# Patient Record
Sex: Male | Born: 1940 | Race: White | Hispanic: No | Smoking: Former smoker
Health system: Southern US, Community
[De-identification: ages and names within clinical notes are randomized; demographics above are authoritative.]

## PROBLEM LIST (undated history)

## (undated) ENCOUNTER — Emergency Department (HOSPITAL_COMMUNITY): Admission: EM | Payer: Self-pay | Source: Home / Self Care

## (undated) DIAGNOSIS — I2510 Atherosclerotic heart disease of native coronary artery without angina pectoris: Secondary | ICD-10-CM

## (undated) DIAGNOSIS — I209 Angina pectoris, unspecified: Secondary | ICD-10-CM

## (undated) DIAGNOSIS — I214 Non-ST elevation (NSTEMI) myocardial infarction: Secondary | ICD-10-CM

## (undated) DIAGNOSIS — I10 Essential (primary) hypertension: Secondary | ICD-10-CM

## (undated) DIAGNOSIS — I509 Heart failure, unspecified: Secondary | ICD-10-CM

## (undated) DIAGNOSIS — R0602 Shortness of breath: Secondary | ICD-10-CM

## (undated) DIAGNOSIS — I219 Acute myocardial infarction, unspecified: Secondary | ICD-10-CM

## (undated) DIAGNOSIS — E7800 Pure hypercholesterolemia, unspecified: Secondary | ICD-10-CM

## (undated) DIAGNOSIS — H3530 Unspecified macular degeneration: Secondary | ICD-10-CM

## (undated) DIAGNOSIS — N3000 Acute cystitis without hematuria: Secondary | ICD-10-CM

## (undated) HISTORY — PX: HERNIA REPAIR: SHX51

## (undated) HISTORY — PX: CORONARY ANGIOPLASTY WITH STENT PLACEMENT: SHX49

## (undated) HISTORY — DX: Heart failure, unspecified: I50.9

## (undated) HISTORY — PX: TONSILLECTOMY: SUR1361

---

## 1994-04-13 DIAGNOSIS — I219 Acute myocardial infarction, unspecified: Secondary | ICD-10-CM

## 1994-04-13 HISTORY — DX: Acute myocardial infarction, unspecified: I21.9

## 1999-01-28 ENCOUNTER — Encounter (HOSPITAL_BASED_OUTPATIENT_CLINIC_OR_DEPARTMENT_OTHER): Payer: Self-pay | Admitting: General Surgery

## 1999-01-30 ENCOUNTER — Ambulatory Visit (HOSPITAL_COMMUNITY): Admission: RE | Admit: 1999-01-30 | Discharge: 1999-01-31 | Payer: Self-pay | Admitting: General Surgery

## 2005-03-26 ENCOUNTER — Ambulatory Visit: Payer: Self-pay | Admitting: Gastroenterology

## 2005-11-12 ENCOUNTER — Other Ambulatory Visit: Payer: Self-pay

## 2005-11-12 ENCOUNTER — Inpatient Hospital Stay: Payer: Self-pay | Admitting: Internal Medicine

## 2007-10-06 ENCOUNTER — Inpatient Hospital Stay: Payer: Self-pay | Admitting: Internal Medicine

## 2007-10-07 ENCOUNTER — Other Ambulatory Visit: Payer: Self-pay

## 2007-10-22 ENCOUNTER — Emergency Department: Payer: Self-pay | Admitting: Emergency Medicine

## 2007-11-30 ENCOUNTER — Encounter: Payer: Self-pay | Admitting: Internal Medicine

## 2007-12-13 ENCOUNTER — Encounter: Payer: Self-pay | Admitting: Internal Medicine

## 2008-01-12 ENCOUNTER — Encounter: Payer: Self-pay | Admitting: Internal Medicine

## 2008-02-12 ENCOUNTER — Encounter: Payer: Self-pay | Admitting: Internal Medicine

## 2010-01-30 ENCOUNTER — Ambulatory Visit: Payer: Self-pay | Admitting: Orthopedic Surgery

## 2011-03-14 DIAGNOSIS — N3000 Acute cystitis without hematuria: Secondary | ICD-10-CM

## 2011-03-14 HISTORY — DX: Acute cystitis without hematuria: N30.00

## 2011-03-20 ENCOUNTER — Inpatient Hospital Stay: Payer: Self-pay | Admitting: Urology

## 2011-03-21 ENCOUNTER — Ambulatory Visit: Payer: Self-pay | Admitting: Urology

## 2011-03-26 ENCOUNTER — Emergency Department: Payer: Self-pay | Admitting: *Deleted

## 2011-03-28 ENCOUNTER — Emergency Department: Payer: Self-pay | Admitting: Emergency Medicine

## 2011-04-03 ENCOUNTER — Inpatient Hospital Stay: Payer: Self-pay | Admitting: Internal Medicine

## 2011-04-06 ENCOUNTER — Encounter: Payer: Self-pay | Admitting: *Deleted

## 2011-04-06 ENCOUNTER — Observation Stay (HOSPITAL_COMMUNITY)
Admission: EM | Admit: 2011-04-06 | Discharge: 2011-04-07 | Disposition: A | Discharge status: Home / Self Care | Payer: Medicare Other | Attending: Urology | Admitting: Urology

## 2011-04-06 DIAGNOSIS — Z79899 Other long term (current) drug therapy: Secondary | ICD-10-CM | POA: Insufficient documentation

## 2011-04-06 DIAGNOSIS — R339 Retention of urine, unspecified: Secondary | ICD-10-CM

## 2011-04-06 DIAGNOSIS — I10 Essential (primary) hypertension: Secondary | ICD-10-CM | POA: Insufficient documentation

## 2011-04-06 DIAGNOSIS — R319 Hematuria, unspecified: Principal | ICD-10-CM | POA: Insufficient documentation

## 2011-04-06 DIAGNOSIS — D090 Carcinoma in situ of bladder: Secondary | ICD-10-CM | POA: Insufficient documentation

## 2011-04-06 DIAGNOSIS — I2510 Atherosclerotic heart disease of native coronary artery without angina pectoris: Secondary | ICD-10-CM | POA: Insufficient documentation

## 2011-04-06 DIAGNOSIS — J45909 Unspecified asthma, uncomplicated: Secondary | ICD-10-CM | POA: Insufficient documentation

## 2011-04-06 HISTORY — DX: Essential (primary) hypertension: I10

## 2011-04-06 HISTORY — DX: Atherosclerotic heart disease of native coronary artery without angina pectoris: I25.10

## 2011-04-06 LAB — CBC
MCV: 83.9 fL (ref 78.0–100.0)
Platelets: 294 10*3/uL (ref 150–400)
RBC: 3.8 MIL/uL — ABNORMAL LOW (ref 4.22–5.81)
WBC: 8.9 10*3/uL (ref 4.0–10.5)

## 2011-04-06 LAB — BASIC METABOLIC PANEL
CO2: 28 mEq/L (ref 19–32)
Calcium: 9.3 mg/dL (ref 8.4–10.5)
Chloride: 101 mEq/L (ref 96–112)
Glucose, Bld: 96 mg/dL (ref 70–99)
Potassium: 3.8 mEq/L (ref 3.5–5.1)
Sodium: 136 mEq/L (ref 135–145)

## 2011-04-06 LAB — HEPATIC FUNCTION PANEL
ALT: 99 U/L — ABNORMAL HIGH (ref 0–53)
AST: 50 U/L — ABNORMAL HIGH (ref 0–37)
Albumin: 3 g/dL — ABNORMAL LOW (ref 3.5–5.2)
Alkaline Phosphatase: 107 U/L (ref 39–117)
Total Bilirubin: 0.3 mg/dL (ref 0.3–1.2)
Total Protein: 6.5 g/dL (ref 6.0–8.3)

## 2011-04-06 LAB — DIFFERENTIAL
Eosinophils Relative: 1 % (ref 0–5)
Lymphocytes Relative: 20 % (ref 12–46)
Lymphs Abs: 1.8 10*3/uL (ref 0.7–4.0)
Neutro Abs: 6.2 10*3/uL (ref 1.7–7.7)

## 2011-04-06 LAB — URINE MICROSCOPIC-ADD ON

## 2011-04-06 LAB — URINALYSIS, ROUTINE W REFLEX MICROSCOPIC
Ketones, ur: NEGATIVE mg/dL
Nitrite: NEGATIVE
Protein, ur: 30 mg/dL — AB

## 2011-04-06 MED ORDER — SODIUM CHLORIDE 0.9 % IR SOLN
3000.0000 mL | Status: DC
  Administered 2011-04-06 (×2): 3000 mL
  Filled 2011-04-06: qty 3000

## 2011-04-06 MED ORDER — SODIUM CHLORIDE 0.9 % IV SOLN
Freq: Once | INTRAVENOUS | Status: AC
  Administered 2011-04-06: 125 mL via INTRAVENOUS

## 2011-04-06 NOTE — ED Notes (Signed)
continuous bladder irrigation initiated.

## 2011-04-06 NOTE — ED Notes (Signed)
Calling report to charge rn on [facility] and was told i have to hold the patient for 30 min till the nurse they called in arrives to resume care of the patient.

## 2011-04-06 NOTE — Progress Notes (Signed)
Pt. Arrived to floor per cart from ED just before 2200.  Oriented to room, [place] viewed.  IV NSL.  Continuous bladder infusion was stopped, irrigation bags were empty.  Additional bags requisitioned, irrigation restarted at this time, output light pink to pale yellow.  Patient without complaint of pain/discomfort. Howell Pringle Diella Gillingham,RN  2300 04/06/11

## 2011-04-06 NOTE — ED Notes (Signed)
Per dr ordered pt allowed to have meal tray. Ham sandwhich and fruit cup provided to pt.

## 2011-04-06 NOTE — ED Notes (Signed)
Pt has been experiencing urinary retention x 3.5 hours.  Pt presents with the inability to urinate and experiencing bladder pain.

## 2011-04-06 NOTE — H&P (Signed)
Urology History and Physical Exam  CC: Gross hematuria  HPI: 70 year old male presents with clot urinary retention.  He had a catheter placed about one week ago due to gross hematuria.  He has already began a work up for his gross hematuria with urologist Dr. Bernardo Heater at [facility].  He had a cystoscopy, bladder biopsy, and bilateral retrograde pyelograms.  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    Today, I irrigated his catheter by hand with over 4 liter of sterile NS with continued return of old clot.  He will need to be admitted for continuous bladder irrigation.  His labs are stable with good renal function and a stable anemia (Hgb 10.6 from 10.1 on 04/03/11).  PMH: Past Medical History  Diagnosis Date  . Hypertension   . Asthma   . Coronary artery disease     PSH: Past Surgical History  Procedure Date  . Coronary stent placement   . Hernia repair     Allergies: No Known Allergies  Medications:  (Not in a hospital admission)   Social History: History   Social History  . Marital Status: Married    Spouse Name: N/A    Number of Children: N/A  . Years of Education: N/A   Occupational History  . Not on file.   Social History Main Topics  . Smoking status: Not on file  . Smokeless tobacco: Not on file  . Alcohol Use:   . Drug Use:   . Sexually Active:    Other Topics Concern  . Not on file   Social History Narrative  . No narrative on file    Family History: History reviewed. No pertinent family history.  Review of Systems: Positive: Bladder spasms Negative: Chest pain, SOB.  A further 10 point review of systems was negative except what is listed in the HPI.  Physical Exam: Filed Vitals:   04/06/11 1859  BP: 141/59  Pulse: 70  Temp: 98 F (36.7 C)  Resp: 18   General: No acute distress.  Awake. Head:  Normocephalic.  Atraumatic. ENT:  EOMI.  Mucous membranes moist Neck:  Supple.  No lymphadenopathy. CV:  S1  present. S2 present. Regular rate. Pulmonary: Equal effort bilaterally.  Clear to auscultation bilaterally. Abdomen: Soft.  Non-tender to palpation. Skin:  Normal turgor.  No visible rash. Extremity: No gross deformity of bilateral upper extremities.  No gross deformity of    bilateral lower extremities. Neurologic: Alert. Appropriate mood.  Penis:  Circumcised.  No lesions. Urethra: 22Fr 3-way Foley catheter in place.  Orthotopic meatus. Scrotum: No lesions.  No ecchymosis.  No erythema. Testicles: Descended bilaterally.  No masses bilaterally. Epididymis: Palpable bilaterally.  Mildly tender to palpation.  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  Plan: -Patient has follow up scheduled with Dr. Bernardo Heater.  He wishes to keep this for definitive treatment of his  bladder cancer.  -Admit to Urology for continuous bladder irrigation. -Will reassess tomorrow; possible discharge home with catheter in place.

## 2011-04-06 NOTE — ED Notes (Signed)
Pt reports unable to void since 0430 this morning accompanied by bladder pain. Previous hx of same recently. Foley had been placed, bloody urine noted. Pain had subsided. Denied any pain right now. 350 cc of urine noted in foley. Pt is alert, oriented. ABC intact

## 2011-04-06 NOTE — ED Provider Notes (Signed)
History     CSN: [id number]  Arrival date & time 04/06/11  0619   First MD Initiated Contact with Patient 04/06/11 0750      Chief Complaint  Patient presents with  . Urinary Retention    (Consider location/radiation/quality/duration/timing/severity/associated sxs/prior treatment) The history is provided by the patient.   Patient here with irritation x2 and half hours. Patient was discharged from [facility] yesterday after an admission for persistent hematuria. According to the patient, he has had a cystoscopy which did not show her recent for his urinary retention. He has not had a Foley catheter for 2 days and was doing fine until this morning when he noticed that he started having suprapubic pain. Denies any vomiting or dysuria. No flank pain noted. Nothing makes the symptoms worse. Pain is described as being sharp in nature Past Medical History  Diagnosis Date  . Hypertension   . Asthma   . Coronary artery disease     Past Surgical History  Procedure Date  . Coronary stent placement   . Hernia repair     History reviewed. No pertinent family history.  History  Substance Use Topics  . Smoking status: Not on file  . Smokeless tobacco: Not on file  . Alcohol Use:       Review of Systems  All other systems reviewed and are negative.    Allergies  Review of patient's allergies indicates no known allergies.  Home Medications   Current Outpatient Rx  Name Route Sig Dispense Refill  . AMLODIPINE BESY-BENAZEPRIL HCL 10-20 MG PO CAPS Oral Take 1 capsule by mouth daily.      . IRBESARTAN-HYDROCHLOROTHIAZIDE 150-12.5 MG PO TABS Oral Take 1 tablet by mouth daily.      Marland Kitchen NIACIN (ANTIHYPERLIPIDEMIC) 1000 MG PO TBCR Oral Take 1,000 mg by mouth at bedtime.      . OMEPRAZOLE 10 MG PO CPDR Oral Take 10 mg by mouth daily.      Marland Kitchen POTASSIUM CHLORIDE 20 MEQ PO PACK Oral Take 20 mEq by mouth 2 (two) times daily.      Marland Kitchen TAMSULOSIN HCL 0.4 MG PO CAPS Oral Take  0.4 mg by mouth daily after breakfast.       BP 188/71  Pulse 74  Temp(Src) 98.6 F (37 C) (Oral)  Resp 20  SpO2 97%  Physical Exam  Nursing note and vitals reviewed. Constitutional: He is oriented to person, place, and time. He appears well-developed and well-nourished.  Non-toxic appearance. No distress.  HENT:  Head: Normocephalic and atraumatic.  Eyes: Conjunctivae, EOM and lids are normal. Pupils are equal, round, and reactive to light.  Neck: Normal range of motion. Neck supple. No tracheal deviation present. No mass present.  Cardiovascular: Normal rate, regular rhythm and normal heart sounds.  Exam reveals no gallop.   No murmur heard. Pulmonary/Chest: Effort normal and breath sounds normal. No stridor. No respiratory distress. He has no decreased breath sounds. He has no wheezes. He has no rhonchi. He has no rales.  Abdominal: Soft. Normal appearance and bowel sounds are normal. He exhibits no distension. There is no tenderness. There is no rebound and no CVA tenderness.  Musculoskeletal: Normal range of motion. He exhibits no edema and no tenderness.  Neurological: He is alert and oriented to person, place, and time. He has normal strength. No cranial nerve deficit or sensory deficit. GCS eye subscore is 4. GCS verbal subscore is 5. GCS motor subscore is 6.  Skin: Skin is warm  and dry. No abrasion and no rash noted.  Psychiatric: He has a normal mood and affect. His speech is normal and behavior is normal.    ED Course  Procedures (including critical care time)   Labs Reviewed  URINALYSIS, ROUTINE W REFLEX MICROSCOPIC  URINE CULTURE  CBC  DIFFERENTIAL  BASIC METABOLIC PANEL   No results found.   No diagnosis found.    MDM  Patient's old records from, his region reviewed. Patient had a negative urine culture and a hemoglobin of 10.3 on December 23 at time of discharge. Patient did have a cystoscopy with biopsy which was negative. Patient's renal function also  noted to be normal      1:39 PM Spoke with urologist on call, dr. Hessie Knows, and he has been to the department and seen the patient. He will need to return again to complete his evaluation. Family instructed that there will be a delay with him seen the patient again and they understand this. They also understand that there is possibility that the patient may not be admitted to the hospital.  Leota Jacobsen, MD 04/06/11 1340

## 2011-04-06 NOTE — ED Notes (Signed)
Pt informed that we are still waiting for lab values to come back.

## 2011-04-06 NOTE — ED Notes (Signed)
Urologist in room with patient, he is irrigating patient bladder at this moment.  Patient stable.

## 2011-04-06 NOTE — ED Notes (Signed)
Pt's foley irrigated, clot noted. EDP notified. Pending of urology consult.

## 2011-04-06 NOTE — ED Notes (Signed)
Urology at bedside.

## 2011-04-06 NOTE — ED Notes (Signed)
Pt reports his foley may be clotted, foley checked, is draining fine

## 2011-04-07 ENCOUNTER — Encounter (HOSPITAL_COMMUNITY): Payer: Self-pay | Admitting: *Deleted

## 2011-04-07 MED ORDER — STERILE WATER FOR IRRIGATION IR SOLN: Freq: Once | Status: DC

## 2011-04-07 MED ORDER — HYOSCYAMINE SULFATE 0.125 MG SL SUBL
0.1250 mg | SUBLINGUAL_TABLET | SUBLINGUAL | Status: DC | PRN
  Filled 2011-04-07: qty 1

## 2011-04-07 MED ORDER — ONDANSETRON HCL 4 MG/2ML IJ SOLN: 4.0000 mg | INTRAMUSCULAR | Status: DC | PRN

## 2011-04-07 MED ORDER — POTASSIUM CHLORIDE 20 MEQ PO PACK
20.0000 meq | PACK | Freq: Two times a day (BID) | ORAL | Status: DC
Start: 2011-04-07 — End: 2011-04-07

## 2011-04-07 MED ORDER — HYDROCHLOROTHIAZIDE 12.5 MG PO CAPS
12.5000 mg | ORAL_CAPSULE | Freq: Every day | ORAL | Status: DC
  Administered 2011-04-07: 12.5 mg via ORAL
  Filled 2011-04-07: qty 1

## 2011-04-07 MED ORDER — AMLODIPINE BESYLATE 10 MG PO TABS
10.0000 mg | ORAL_TABLET | Freq: Every day | ORAL | Status: DC
  Administered 2011-04-07: 10 mg via ORAL
  Filled 2011-04-07: qty 1

## 2011-04-07 MED ORDER — SODIUM CHLORIDE 0.9 % IV SOLN
INTRAVENOUS | Status: DC
  Administered 2011-04-07: 03:00:00 via INTRAVENOUS

## 2011-04-07 MED ORDER — IRBESARTAN-HYDROCHLOROTHIAZIDE 150-12.5 MG PO TABS: 1.0000 | ORAL_TABLET | Freq: Every day | ORAL | Status: DC

## 2011-04-07 MED ORDER — AMLODIPINE BESY-BENAZEPRIL HCL 10-20 MG PO CAPS: 1.0000 | ORAL_CAPSULE | Freq: Every day | ORAL | Status: DC

## 2011-04-07 MED ORDER — BENAZEPRIL HCL 20 MG PO TABS
20.0000 mg | ORAL_TABLET | Freq: Every day | ORAL | Status: DC
  Administered 2011-04-07: 20 mg via ORAL
  Filled 2011-04-07: qty 1

## 2011-04-07 MED ORDER — POLYETHYLENE GLYCOL 3350 17 G PO PACK
17.0000 g | PACK | Freq: Every day | ORAL | Status: DC
  Administered 2011-04-07: 17 g via ORAL
  Filled 2011-04-07: qty 1

## 2011-04-07 MED ORDER — ACETAMINOPHEN 325 MG PO TABS: 650.0000 mg | ORAL_TABLET | ORAL | Status: DC | PRN

## 2011-04-07 MED ORDER — HYDROCODONE-ACETAMINOPHEN 5-325 MG PO TABS
1.0000 | ORAL_TABLET | ORAL | Status: DC | PRN
  Administered 2011-04-07: 1 via ORAL
  Filled 2011-04-07: qty 1

## 2011-04-07 MED ORDER — SENNOSIDES-DOCUSATE SODIUM 8.6-50 MG PO TABS
2.0000 | ORAL_TABLET | Freq: Every day | ORAL | Status: DC
  Filled 2011-04-07: qty 2

## 2011-04-07 MED ORDER — NIACIN ER 500 MG PO CPCR
1000.0000 mg | ORAL_CAPSULE | Freq: Every day | ORAL | Status: DC
  Filled 2011-04-07: qty 2

## 2011-04-07 MED ORDER — CIPROFLOXACIN HCL 500 MG PO TABS
500.0000 mg | ORAL_TABLET | Freq: Two times a day (BID) | ORAL | Status: DC
  Administered 2011-04-07: 500 mg via ORAL
  Filled 2011-04-07 (×2): qty 1

## 2011-04-07 MED ORDER — OLMESARTAN MEDOXOMIL 20 MG PO TABS
20.0000 mg | ORAL_TABLET | Freq: Every day | ORAL | Status: DC
  Administered 2011-04-07: 20 mg via ORAL
  Filled 2011-04-07: qty 1

## 2011-04-07 MED ORDER — TAMSULOSIN HCL 0.4 MG PO CAPS
0.4000 mg | ORAL_CAPSULE | Freq: Every day | ORAL | Status: DC
  Administered 2011-04-07: 0.4 mg via ORAL
  Filled 2011-04-07: qty 1

## 2011-04-07 MED ORDER — POTASSIUM CHLORIDE CRYS ER 20 MEQ PO TBCR
20.0000 meq | EXTENDED_RELEASE_TABLET | Freq: Two times a day (BID) | ORAL | Status: DC
  Administered 2011-04-07 (×2): 20 meq via ORAL
  Filled 2011-04-07 (×3): qty 1

## 2011-04-07 MED ORDER — PANTOPRAZOLE SODIUM 40 MG PO TBEC
40.0000 mg | DELAYED_RELEASE_TABLET | Freq: Every day | ORAL | Status: DC
  Filled 2011-04-07: qty 1

## 2011-04-07 MED ORDER — NIACIN ER (ANTIHYPERLIPIDEMIC) 500 MG PO TBCR: 1000.0000 mg | EXTENDED_RELEASE_TABLET | Freq: Every day | ORAL | Status: DC

## 2011-04-07 NOTE — Progress Notes (Signed)
MD discharged patient to home.  Patient will discharge with foley.  RN gave foley care instruction.  Patient demonstrated and verbalized understanding.

## 2011-04-07 NOTE — Progress Notes (Signed)
Urology Progress Note  Subjective:     No acute urologic events. No catheter obstruction overnight.  Urine remained clear on CBI and it was able to be slowed down to an extremely slow drip.  Hand irrigation this morning with over  1 liter of sterile water returned no hematuria or clot.  Objective:  Patient Vitals for the past 24 hrs:  BP Temp Temp src Pulse Resp SpO2 Height Weight  04/07/11 0616 171/81 mmHg 98 F (36.7 C) Oral 71  18  95 % - -  04/06/11 2150 162/77 mmHg 98.5 F (36.9 C) Oral 64  18  95 % 5\' 11"  (1.803 m) 103.511 kg (228 lb 3.2 oz)  04/06/11 1859 141/59 mmHg 98 F (36.7 C) Oral 70  18  95 % - -    Physical Exam: General:  No acute distress, awake Cardiovascular:    [ x ]  S1/S2 present, RRR  [  ]  Irregularly irregular Chest:  CTA-B Abdomen:               [  ] Soft, appropriately TTP  [ x ] Soft, NTTP  [  ] Soft, appropriately TTP, incision(s) clean/dry/intact  Genitourinary:  3-way catheter in place. Foley:  Draining clear fluid.    I/O last 3 completed shifts: In: 2772 [I.V.:172; Other:2600] Out: 9925 [Urine:9925]     Assessment: Gross hematuria. Plan: -Discontinue CBI. -Discharge home.   Rolan Bucco, MD [phone]

## 2011-04-07 NOTE — Discharge Summary (Signed)
Physician Discharge Summary  Patient ID: Peter [name] MRN: [id number] DOB/AGE: 20-May-1940 70 y.o.  Admit date: 04/06/2011 Discharge date: 04/07/2011  Admission Diagnoses: Gross hematuria. Urinary retention.  Discharge Diagnoses:  Gross hematuria Urinary retention.  Discharged Condition: good  Hospital Course:  Patient admitted for continuous bladder irrigation for gross hematuria and clot urinary retention.  His urine remained clear overnight and repeat hand irrigation returned on clots.  Patient is followed by Dr. Bernardo Heater, a urologist who is working him up for bladder cancer.  His continuous bladder irrigation was able to be turned off and he was discharged home to follow up with his urologist for catheter management and bladder cancer.  Consults: none  Significant Diagnostic Studies: None  Treatments: Continuous bladder irrigation.  Discharge Exam: Blood pressure 171/81, pulse 71, temperature 98 F (36.7 C), temperature source Oral, resp. rate 18, height 5\' 11"  (1.803 m), weight 103.511 kg (228 lb 3.2 oz), SpO2 95.00%. See progress note from date of discharge.  Disposition:   Discharge Orders    Future Orders Please Complete By Expires   Discharge instructions      Comments:   Nurse teach foley catheter care.  Provide with leg bag and overnight bag.   Discharge patient        Medication List  As of 04/07/2011 11:02 AM   CONTINUE taking these medications         amLODipine-benazepril 10-20 MG per capsule   Commonly known as: LOTREL      irbesartan-hydrochlorothiazide 150-12.5 MG per tablet   Commonly known as: AVALIDE      niacin 1000 MG CR tablet   Commonly known as: NIASPAN      omeprazole 10 MG capsule   Commonly known as: PRILOSEC      potassium chloride SA 20 MEQ tablet   Commonly known as: K-DUR,KLOR-CON      Tamsulosin HCl 0.4 MG Caps   Commonly known as: FLOMAX           Follow-up Information    Follow up with Dr. Bernardo Heater (urologist) as  scheduled..         Signed: Molli Hazard 04/07/2011, 11:02 AM

## 2011-04-08 LAB — URINE CULTURE: Colony Count: NO GROWTH

## 2011-05-21 ENCOUNTER — Ambulatory Visit: Payer: Self-pay | Admitting: Urology

## 2011-05-27 ENCOUNTER — Ambulatory Visit: Payer: Self-pay | Admitting: Urology

## 2011-12-13 LAB — CBC
HCT: 40.2 % (ref 40.0–52.0)
MCV: 85 fL (ref 80–100)
RBC: 4.71 10*6/uL (ref 4.40–5.90)
RDW: 14.1 % (ref 11.5–14.5)
WBC: 7.3 10*3/uL (ref 3.8–10.6)

## 2011-12-14 ENCOUNTER — Inpatient Hospital Stay (HOSPITAL_COMMUNITY)
Admission: AD | Admit: 2011-12-14 | Payer: Self-pay | Source: Other Acute Inpatient Hospital | Admitting: Internal Medicine

## 2011-12-14 ENCOUNTER — Inpatient Hospital Stay (HOSPITAL_COMMUNITY)
Admission: AD | Admit: 2011-12-14 | Discharge: 2011-12-18 | DRG: 247 | Disposition: A | Discharge status: Home / Self Care | Payer: Medicare Other | Source: Other Acute Inpatient Hospital | Attending: Internal Medicine | Admitting: Internal Medicine

## 2011-12-14 ENCOUNTER — Inpatient Hospital Stay: Payer: Self-pay | Admitting: Internal Medicine

## 2011-12-14 ENCOUNTER — Encounter (HOSPITAL_COMMUNITY): Payer: Self-pay | Admitting: General Practice

## 2011-12-14 DIAGNOSIS — K219 Gastro-esophageal reflux disease without esophagitis: Secondary | ICD-10-CM

## 2011-12-14 DIAGNOSIS — I2510 Atherosclerotic heart disease of native coronary artery without angina pectoris: Secondary | ICD-10-CM

## 2011-12-14 DIAGNOSIS — I712 Thoracic aortic aneurysm, without rupture: Secondary | ICD-10-CM

## 2011-12-14 DIAGNOSIS — Z9861 Coronary angioplasty status: Secondary | ICD-10-CM

## 2011-12-14 DIAGNOSIS — R0602 Shortness of breath: Secondary | ICD-10-CM

## 2011-12-14 DIAGNOSIS — E785 Hyperlipidemia, unspecified: Secondary | ICD-10-CM

## 2011-12-14 DIAGNOSIS — I714 Abdominal aortic aneurysm, without rupture, unspecified: Secondary | ICD-10-CM | POA: Diagnosis present

## 2011-12-14 DIAGNOSIS — F17200 Nicotine dependence, unspecified, uncomplicated: Secondary | ICD-10-CM | POA: Diagnosis present

## 2011-12-14 DIAGNOSIS — N400 Enlarged prostate without lower urinary tract symptoms: Secondary | ICD-10-CM

## 2011-12-14 DIAGNOSIS — I10 Essential (primary) hypertension: Secondary | ICD-10-CM

## 2011-12-14 DIAGNOSIS — E876 Hypokalemia: Secondary | ICD-10-CM

## 2011-12-14 DIAGNOSIS — J45909 Unspecified asthma, uncomplicated: Secondary | ICD-10-CM | POA: Diagnosis present

## 2011-12-14 DIAGNOSIS — I214 Non-ST elevation (NSTEMI) myocardial infarction: Principal | ICD-10-CM

## 2011-12-14 DIAGNOSIS — Z79899 Other long term (current) drug therapy: Secondary | ICD-10-CM

## 2011-12-14 DIAGNOSIS — H3530 Unspecified macular degeneration: Secondary | ICD-10-CM

## 2011-12-14 DIAGNOSIS — Z7982 Long term (current) use of aspirin: Secondary | ICD-10-CM

## 2011-12-14 DIAGNOSIS — I7122 Aneurysm of the aortic arch, without rupture: Secondary | ICD-10-CM

## 2011-12-14 HISTORY — DX: Unspecified macular degeneration: H35.30

## 2011-12-14 HISTORY — DX: Angina pectoris, unspecified: I20.9

## 2011-12-14 HISTORY — DX: Shortness of breath: R06.02

## 2011-12-14 HISTORY — DX: Pure hypercholesterolemia, unspecified: E78.00

## 2011-12-14 HISTORY — DX: Non-ST elevation (NSTEMI) myocardial infarction: I21.4

## 2011-12-14 HISTORY — DX: Acute cystitis without hematuria: N30.00

## 2011-12-14 HISTORY — DX: Acute myocardial infarction, unspecified: I21.9

## 2011-12-14 LAB — CK TOTAL AND CKMB (NOT AT ARMC)
CK, Total: 62 U/L (ref 35–232)
CK-MB: 0.8 ng/mL (ref 0.5–3.6)

## 2011-12-14 LAB — TROPONIN I: Troponin-I: 0.08 ng/mL — ABNORMAL HIGH

## 2011-12-14 LAB — BASIC METABOLIC PANEL
Calcium, Total: 8.8 mg/dL (ref 8.5–10.1)
Chloride: 103 mmol/L (ref 98–107)
Co2: 31 mmol/L (ref 21–32)
EGFR (Non-African Amer.): 60 — ABNORMAL LOW
Glucose: 136 mg/dL — ABNORMAL HIGH (ref 65–99)
Potassium: 3.5 mmol/L (ref 3.5–5.1)
Sodium: 139 mmol/L (ref 136–145)

## 2011-12-14 LAB — APTT
Activated PTT: 34.7 secs (ref 23.6–35.9)
Activated PTT: 113.6 secs — ABNORMAL HIGH (ref 23.6–35.9)

## 2011-12-14 MED ORDER — ONDANSETRON HCL 4 MG PO TABS: 4.0000 mg | ORAL_TABLET | Freq: Four times a day (QID) | ORAL | Status: DC | PRN

## 2011-12-14 MED ORDER — AMLODIPINE BESYLATE 10 MG PO TABS
10.0000 mg | ORAL_TABLET | Freq: Every day | ORAL | Status: DC
  Administered 2011-12-14 – 2011-12-18 (×5): 10 mg via ORAL
  Filled 2011-12-14 (×5): qty 1

## 2011-12-14 MED ORDER — ACETAMINOPHEN 325 MG PO TABS: 650.0000 mg | ORAL_TABLET | Freq: Four times a day (QID) | ORAL | Status: DC | PRN

## 2011-12-14 MED ORDER — SENNOSIDES-DOCUSATE SODIUM 8.6-50 MG PO TABS
1.0000 | ORAL_TABLET | Freq: Every evening | ORAL | Status: DC | PRN
  Filled 2011-12-14: qty 1

## 2011-12-14 MED ORDER — HYDROCHLOROTHIAZIDE 12.5 MG PO CAPS
12.5000 mg | ORAL_CAPSULE | Freq: Every day | ORAL | Status: DC
  Administered 2011-12-14 – 2011-12-18 (×5): 12.5 mg via ORAL
  Filled 2011-12-14 (×5): qty 1

## 2011-12-14 MED ORDER — PANTOPRAZOLE SODIUM 40 MG PO TBEC
40.0000 mg | DELAYED_RELEASE_TABLET | Freq: Every day | ORAL | Status: DC
  Administered 2011-12-14 – 2011-12-17 (×4): 40 mg via ORAL
  Filled 2011-12-14: qty 1

## 2011-12-14 MED ORDER — IRBESARTAN-HYDROCHLOROTHIAZIDE 150-12.5 MG PO TABS: 1.0000 | ORAL_TABLET | Freq: Every day | ORAL | Status: DC

## 2011-12-14 MED ORDER — NIACIN ER 500 MG PO CPCR
1000.0000 mg | ORAL_CAPSULE | Freq: Every day | ORAL | Status: DC
  Administered 2011-12-15: 1000 mg via ORAL
  Filled 2011-12-14 (×4): qty 2

## 2011-12-14 MED ORDER — ONDANSETRON HCL 4 MG/2ML IJ SOLN: 4.0000 mg | Freq: Four times a day (QID) | INTRAMUSCULAR | Status: DC | PRN

## 2011-12-14 MED ORDER — IRBESARTAN 150 MG PO TABS
150.0000 mg | ORAL_TABLET | Freq: Every day | ORAL | Status: DC
  Administered 2011-12-14 – 2011-12-18 (×5): 150 mg via ORAL
  Filled 2011-12-14 (×5): qty 1

## 2011-12-14 MED ORDER — METOPROLOL TARTRATE 12.5 MG HALF TABLET
12.5000 mg | ORAL_TABLET | Freq: Two times a day (BID) | ORAL | Status: DC
  Administered 2011-12-14 – 2011-12-15 (×3): 12.5 mg via ORAL
  Filled 2011-12-14 (×4): qty 1

## 2011-12-14 MED ORDER — SODIUM CHLORIDE 0.9 % IJ SOLN
3.0000 mL | Freq: Two times a day (BID) | INTRAMUSCULAR | Status: DC
  Administered 2011-12-15 – 2011-12-18 (×4): 3 mL via INTRAVENOUS

## 2011-12-14 MED ORDER — SODIUM CHLORIDE 0.9 % IJ SOLN: 3.0000 mL | INTRAMUSCULAR | Status: DC | PRN

## 2011-12-14 MED ORDER — ACETAMINOPHEN 650 MG RE SUPP: 650.0000 mg | Freq: Four times a day (QID) | RECTAL | Status: DC | PRN

## 2011-12-14 MED ORDER — SODIUM CHLORIDE 0.9 % IJ SOLN
3.0000 mL | Freq: Two times a day (BID) | INTRAMUSCULAR | Status: DC
  Administered 2011-12-14 – 2011-12-17 (×6): 3 mL via INTRAVENOUS
  Administered 2011-12-17: 10:00:00 via INTRAVENOUS
  Administered 2011-12-18: 3 mL via INTRAVENOUS

## 2011-12-14 MED ORDER — MORPHINE SULFATE 2 MG/ML IJ SOLN: 1.0000 mg | INTRAMUSCULAR | Status: DC | PRN

## 2011-12-14 MED ORDER — TAMSULOSIN HCL 0.4 MG PO CAPS
0.4000 mg | ORAL_CAPSULE | Freq: Every day | ORAL | Status: DC
  Administered 2011-12-15 – 2011-12-18 (×4): 0.4 mg via ORAL
  Filled 2011-12-14 (×5): qty 1

## 2011-12-14 MED ORDER — OXYCODONE HCL 5 MG PO TABS: 5.0000 mg | ORAL_TABLET | ORAL | Status: DC | PRN

## 2011-12-14 MED ORDER — NIACIN ER (ANTIHYPERLIPIDEMIC) 1000 MG PO TBCR: 1000.0000 mg | EXTENDED_RELEASE_TABLET | Freq: Every day | ORAL | Status: DC

## 2011-12-14 MED ORDER — SODIUM CHLORIDE 0.9 % IV SOLN: 250.0000 mL | INTRAVENOUS | Status: DC | PRN

## 2011-12-14 NOTE — Progress Notes (Signed)
Patient ID: Peter [name], male   DOB: 1941/03/24, 71 y.o.   MRN: [id number]   CARDIOLOGY CONSULT NOTE  Patient ID: Peter [name] MRN: [id number], DOB/AGE: 1940/07/29   Admit date: 12/14/2011 Date of Consult: 12/14/2011   Primary Physician: Peter Peter Chard MD [facility] [name] Primary Cardiologist: Peter Lex MD  Pt. Profile Peter [name] is a 71 year old gentleman referred to [name] for further evaluation and management of his history of coronary artery disease, shortness of breath with a positive troponin of 0.11, and an abnormal CT scan which suggested an aortic aneurysm abnormality.   Problem List  Past Medical History  Diagnosis Date  . Hypertension   . Asthma   . Coronary artery disease     Past Surgical History  Procedure Date  . Coronary stent placement   . Hernia repair      Allergies  No Known Allergies  HPI   He denies any chest discomfort other than the shortness of breath. However, when carefully questioned, he states he has had some exertional burning over his left upper chest , which was identical to what he had prior to having his stents placed in the past. Details not available.  He also notes burning in his chest when he coughs. 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  His risk factors include male sex, history of coronary disease, hypertension, hyperlipidemia, and tobacco use.  He denies any history of stroke. He said orthopnea, PND or edema. He denies any palpitations presyncope or syncope. He has no significant dyspnea on exertion. He denies any upper thoracic mid scapular pain. He's had no claudication.  Inpatient Medications     . amLODipine  10 mg Oral Daily  . irbesartan  150 mg Oral Daily   And  . hydrochlorothiazide  12.5 mg Oral Daily  . metoprolol tartrate  12.5 mg Oral BID  . niacin  1,000 mg Oral QHS  . pantoprazole  40 mg Oral Q1200  . sodium chloride  3 mL  Intravenous Q12H  . sodium chloride  3 mL Intravenous Q12H  . Tamsulosin HCl  0.4 mg Oral QPC breakfast  . DISCONTD: irbesartan-hydrochlorothiazide  1 tablet Oral Daily  . DISCONTD: niacin  1,000 mg Oral QHS    Family History No family history on file.   Social History History   Social History  . Marital Status: Married    Spouse Name: N/A    Number of Children: N/A  . Years of Education: N/A   Occupational History  . Not on file.   Social History Main Topics  . Smoking status: Current Everyday Smoker -- 1.0 packs/day for 53 years  . Smokeless tobacco: Never Used  . Alcohol Use: Yes     occasional  . Drug Use: No  . Sexually Active:    Other Topics Concern  . Not on file   Social History Narrative  . No narrative on file     Review of Systems  General:  No chills, fever, night sweats or weight changes.  Cardiovascular:  No chest pain, dyspnea on exertion, edema, orthopnea, palpitations, paroxysmal nocturnal dyspnea. Dermatological: No rash, lesions/masses Respiratory: Cough with clear sputum Urologic: No hematuria, dysuria Abdominal:   No nausea, vomiting, diarrhea, bright red blood per rectum, melena, or hematemesis Neurologic:  No visual changes, wkns, changes in mental status. All other systems reviewed and are otherwise negative except as noted above.  Physical Exam  Blood pressure  139/70, pulse 67, temperature 98.2 F (36.8 C), temperature source Oral, resp. rate 18, height 5\' 11"  (1.803 m), weight 235 lb 9.6 oz (106.867 kg), SpO2 96.00%.  General: Pleasant, NAD, overweight Psych: Normal affect. Neuro: Alert and oriented X 3. Moves all extremities spontaneously. HEENT: Normal  Neck: Supple without bruits or JVD. Lungs:  Resp regular and unlabored, CTA. Heart: RRR no s3, s4, soft systolic murmur left upper sternal border, no diastolic component Abdomen: Soft, non-tender, non-distended, BS + x 4.  Extremities: No clubbing, cyanosis or edema. DP/PT/Radials  2+ and equal bilaterally.  Labs   [name] 12/14/11 1518  CKTOTAL --  CKMB --  TROPONINI <0.30   Lab Results  Component Value Date   WBC 8.9 04/06/2011   HGB 10.6* 04/06/2011   HCT 31.9* 04/06/2011   MCV 83.9 04/06/2011   PLT 294 04/06/2011   No results found for this basename: NA,K,CL,CO2,BUN,CREATININE,CALCIUM,LABALBU,PROT,BILITOT,ALKPHOS,ALT,AST,GLUCOSE in the last 168 hours No results found for this basename: CHOL, HDL, LDLCALC, TRIG   No results found for this basename: DDIMER    Radiology/Studies  No results found.  ECG  Pending.  ASSESSMENT AND PLAN  Peter [name] is a very pleasant 71 year old gentleman with known coronary disease with a history of coronary stenting, unknown vessel, about 5 years ago by Peter. Wilburn [name]. He presents with shortness of breath with burning when he coughs, but no angina. However, he designated to exertional chest burning ever since he had his stents placed 5 years ago. This has not changed.  We're waiting for the CT images for further consultation by Peter [name]. In the meantime, I would not change in his medications and certainly would not anticoagulate him. I've talked to him for a long time about the possible need for cardiac catheterization. Technically, he's had a NSTEMI. We'll discuss with our cardiology team once we have the images reviewed. He may need additional imaging of the chest. I've advised to stop smoking because of the ulcerative nature of his blood vessels in general.  He says that he has a catheterization he would like to have it at [place] with Peter. Wilburn [name]. I told him this may not be possible because they did not have the vascular surgery backup if indeed he had an aortic aneurysm or surgical issue. He understood.   Signed, Peter Milliner, MD 12/14/2011, 4:42 PM

## 2011-12-14 NOTE — H&P (Signed)
[facility]          History and Physical    PCP:   Pcp Not In System   Chief Complaint:  Shortness of breath, CT abnormality.  HPI: Patient is a pleasant 71 year old white man with history of coronary artery disease who has had 3 stents placed in the past, hypertension, GERD, hyperlipidemia, BPH who presented to [facility] yesterday with complaints of shortness of breath. It appears patient had been having upper respiratory infection symptoms but he suddenly felt like his chest was raw and had difficulty breathing and that is why he went to the emergency room in [place]. Over there he was admitted for chest pain rule out. He was found to have elevated cardiac enzymes with troponins from 0.11 down to 0.08. A CT scan was also performed that reads as: Abnormal appearance along the left lateral to the inferolateral aspect of the aortic arch just inferior to an area of atherosclerotic calcification. Small area of ulceration or contained hemorrhage is a concern. No evidence of pulmonary embolism. Physician in [place] requested consultation with Dr. Cyndia Bent, cardiothoracic surgery, who recommended transfer to [facility] for him to evaluate but requested admission to the medical service. Upon my evaluation patient is currently comfortable, no chest pain, no shortness of breath, is asking when he can eat.  Allergies:  No Known Allergies    Past Medical History  Diagnosis Date  . Hypertension   . Asthma   . Coronary artery disease     Past Surgical History  Procedure Date  . Coronary stent placement   . Hernia repair     Prior to Admission medications   Medication Sig Start Date End Date Taking? Authorizing Provider  amLODipine-benazepril (LOTREL) 10-20 MG per capsule Take 1 capsule by mouth daily.      Historical Provider, MD  irbesartan-hydrochlorothiazide (AVALIDE) 150-12.5 MG per tablet Take 1 tablet by mouth daily.      Historical Provider, MD  niacin  (NIASPAN) 1000 MG CR tablet Take 1,000 mg by mouth at bedtime.      Historical Provider, MD  omeprazole (PRILOSEC) 10 MG capsule Take 10 mg by mouth daily.      Historical Provider, MD  potassium chloride SA (K-DUR,KLOR-CON) 20 MEQ tablet Take 20 mEq by mouth 2 (two) times daily.      Historical Provider, MD  Tamsulosin HCl (FLOMAX) 0.4 MG CAPS Take 0.4 mg by mouth daily after breakfast.     Historical Provider, MD    Social History:  reports that he has been smoking.  He has never used smokeless tobacco. He reports that he drinks alcohol. He reports that he does not use illicit drugs.  No family history on file.  Review of Systems:  Constitutional: Denies fever, chills, diaphoresis, appetite change and fatigue.  HEENT: Denies photophobia, eye pain, redness, hearing loss, ear pain, congestion, sore throat, rhinorrhea, sneezing, mouth sores, trouble swallowing, neck pain, neck stiffness and tinnitus.   Respiratory: Denies  DOE, cough, chest tightness,  and wheezing.   Cardiovascular: Denies chest pain, palpitations and leg swelling.  Gastrointestinal: Denies nausea, vomiting, abdominal pain, diarrhea, constipation, blood in stool and abdominal distention.  Genitourinary: Denies dysuria, urgency, frequency, hematuria, flank pain and difficulty urinating.  Musculoskeletal: Denies myalgias, back pain, joint swelling, arthralgias and gait problem.  Skin: Denies pallor, rash and wound.  Neurological: Denies dizziness, seizures, syncope, weakness, light-headedness, numbness and headaches.  Hematological: Denies adenopathy. Easy bruising, personal or family bleeding history  Psychiatric/Behavioral: Denies suicidal  ideation, mood changes, confusion, nervousness, sleep disturbance and agitation   Physical Exam: Blood pressure 128/70, pulse 61, temperature 98.2 F (36.8 C), temperature source Oral, resp. rate 18, height 5\' 11"  (1.803 m), weight 106.867 kg (235 lb 9.6 oz), SpO2 97.00%. General:  Alert, awake, oriented x3, in no distress. HEENT: Normocephalic, atraumatic, pupils equal round and reactive to light, intact extraocular movements, moist mucous membranes, wears corrective lenses. Neck: Supple, no JVD, no lymphadenopathy, no bruits, no goiter. Cardiovascular: Regular rate and rhythm, no murmurs, rubs or gallops Lungs: Clear to auscultation bilaterally. Abdomen: Soft, nontender, nondistended, positive bowel sounds, no masses or organomegaly noted. Extremities: No clubbing, cyanosis or edema, positive pedal pulses. Neurologic: Grossly intact and nonfocal. Skin: No rashes identified.  Labs on Admission:  No results found for this or any previous visit (from the past 48 hour(s)).  Radiological Exams on Admission: No results found.  Assessment/Plan Principal Problem:  *SOB (shortness of breath) Active Problems:  CAD (coronary artery disease)  HTN (hypertension)  Hyperlipidemia  GERD (gastroesophageal reflux disease)  BPH (benign prostatic hyperplasia)  Aneurysm of aortic arch   #1 abnormal CT findings: Unclear if this represents an aneurysm. Will call Dr. Cyndia Bent to inform him that patient has arrived and to figure out what further testing, if any he would like. His blood pressure is well-controlled, however I will go ahead and start him on a beta blocker which he is not taking. We'll hold off on prescribing any anticoagulation or antiplatelet therapy until seen by Dr. Cyndia Bent.  #2 shortness of breath: From his history it sounds to me like this is more of a recent upper respiratory infection. However with his history of coronary artery disease and elevated troponins, I will go ahead and ask cardiology to see him to see if any further testing is required. I will continue to cycle troponin here and I will also order a 2-D echo. Patient has a cardiologist that follows him in [place]. Hold off on antiplatelets/anticoagulation until aneurysm/dissection of the aortic arch is  excluded. Next  #3 hyperlipidemia: We'll check a fasting lipid profile continue niacin for now.  #4 GERD: Continue PPI.  #5 hypertension: Blood pressure well controlled. Will continue home regimen with the exception of benazepril. He is already on an ARB and I believe this is duplication of therapy. Will however add a beta blocker which should be of benefit if indeed he has an aneurysm/dissecting aorta. Next  #6 DVT prophylaxis: SCDs.  Time Spent on Admission: 50 minutes.  Lelon Frohlich [facility] Pager: [phone] 12/14/2011, 1:52 PM

## 2011-12-14 NOTE — Consult Note (Signed)
[address]            [place] [address]          [phone]      Reason for Consult: CT scan abnormality of distal aortic arch Referring Physician:  Dr. Susanne Greenhouse is an 71 y.o. male.  HPI:  The patient is a 71 year old gentleman with history of coronary disease having had 3 stents placed in the past as well as hypertension and hyperlipidemia who was admitted to [facility] yesterday with complaints of shortness of breath. He denies any chest pain. He was found to have a mildly elevated troponin of 0.11 with negative CPK MB and a mildly positive D-dimer. He had a CT scan of the chest to rule out pulmonary embolism and by report it showed no pulmonary embolism but showed some abnormality of the distal aortic arch that sounds like it may be a ulcerated atherosclerotic plaque. Unfortunately a copy of the scan was not sent with the patient and we do not have access to the scans over the PACS system. There is no mention of aortic dissection or aortic aneurysm. The radiologist recommended vascular surgery consultation and the medical doctor taking care of him there was unsure of the significance of the findings and wanted to transfer him to [facility] for further evaluation. At the present time patient said he feels fine.   Past Medical History  Diagnosis Date  . Hypertension   . Asthma   . Coronary artery disease     Past Surgical History  Procedure Date  . Coronary stent placement   . Hernia repair     No family history on file.  Social History:  reports that he has been smoking.  He has never used smokeless tobacco. He reports that he drinks alcohol. He reports that he does not use illicit drugs.  Allergies: No Known Allergies  Medications:  I have reviewed the patient's current medications. Prior to Admission:  Prescriptions prior to admission  Medication Sig Dispense Refill  . amLODipine-benazepril  (LOTREL) 10-20 MG per capsule Take 1 capsule by mouth daily.        Marland Kitchen aspirin 325 MG tablet Take 325 mg by mouth as needed. Patient takes if there is any pain or burning in the chest.      . irbesartan-hydrochlorothiazide (AVALIDE) 150-12.5 MG per tablet Take 1 tablet by mouth daily.        . Multiple Vitamins-Minerals (ICAPS PO) Take 1 capsule by mouth daily.      . niacin (NIASPAN) 500 MG CR tablet Take 1,000 mg by mouth every other day.      Marland Kitchen omeprazole (PRILOSEC) 10 MG capsule Take 10 mg by mouth daily.        . potassium chloride SA (K-DUR,KLOR-CON) 20 MEQ tablet Take 40 mEq by mouth 2 (two) times daily.      . Tamsulosin HCl (FLOMAX) 0.4 MG CAPS Take 0.4 mg by mouth daily after breakfast.        Scheduled:   . amLODipine  10 mg Oral Daily  . irbesartan  150 mg Oral Daily   And  . hydrochlorothiazide  12.5 mg Oral Daily  . metoprolol tartrate  12.5 mg Oral BID  . niacin  1,000 mg Oral QHS  . pantoprazole  40 mg Oral Q1200  . sodium chloride  3  mL Intravenous Q12H  . sodium chloride  3 mL Intravenous Q12H  . Tamsulosin HCl  0.4 mg Oral QPC breakfast  . DISCONTD: irbesartan-hydrochlorothiazide  1 tablet Oral Daily  . DISCONTD: niacin  1,000 mg Oral QHS   Continuous:  [id number] chloride, acetaminophen, acetaminophen, morphine injection, ondansetron (ZOFRAN) IV, ondansetron, oxyCODONE, senna-docusate, sodium chloride  No results found for this or any previous visit (from the past 48 hour(s)).  No results found.  Review of Systems  Constitutional: Negative.   HENT: Negative.   Eyes: Negative.   Respiratory: Positive for shortness of breath.   Cardiovascular: Negative.   Gastrointestinal: Negative.   Genitourinary: Negative.   Musculoskeletal: Negative.   Skin: Negative.   Neurological: Negative.   Endo/Heme/Allergies: Negative.   Psychiatric/Behavioral: Negative.    Blood pressure 158/75, pulse 72, temperature 98.2 F (36.8 C), temperature source Oral, resp. rate 18,  height 5\' 11"  (1.803 m), weight 106.867 kg (235 lb 9.6 oz), SpO2 96.00%. Physical Exam  Constitutional: He is oriented to person, place, and time. He appears well-developed and well-nourished.  HENT:  Head: Normocephalic and atraumatic.  Eyes: EOM are normal. Pupils are equal, round, and reactive to light.  Neck: Normal range of motion. Neck supple. No JVD present. No thyromegaly present.  Cardiovascular: Normal rate, regular rhythm, normal heart sounds and intact distal pulses.  Exam reveals no gallop and no friction rub.   No murmur heard. Respiratory: Effort normal and breath sounds normal. No respiratory distress.  GI: Soft. Bowel sounds are normal. He exhibits no distension and no mass. There is no tenderness.  Musculoskeletal: He exhibits no edema.  Neurological: He is alert and oriented to person, place, and time. He has normal strength. No cranial nerve deficit or sensory deficit.  Skin: Skin is warm and dry.  Psychiatric: He has a normal mood and affect.    Assessment/Plan:  By CT scan report he sounds like he has an incidentally noted ulcerated plaque in the distal aortic arch. I doubt that this is related to his presenting symptoms. Unfortunately we do not have a copy of the CT scan on disc from [facility] and there is no access to the study on the PACS system according to the radiologist here. The scan does need to be reviewed by me before making any recommendations for further diagnostic studies or followup. I'll discuss this with [facility] because I think the transporting team should be sure that the studies are with the patient prior to transport. The family may need to get a copy of the scans from [facility] and bring them to the hospital. Repeating the CT scan here without reviewing the other scan would expose the patient to needless radiation and contrast dye.  Carlean Crowl K 12/14/2011, 3:36 PM

## 2011-12-15 DIAGNOSIS — I712 Thoracic aortic aneurysm, without rupture: Secondary | ICD-10-CM

## 2011-12-15 DIAGNOSIS — N400 Enlarged prostate without lower urinary tract symptoms: Secondary | ICD-10-CM

## 2011-12-15 DIAGNOSIS — E785 Hyperlipidemia, unspecified: Secondary | ICD-10-CM

## 2011-12-15 DIAGNOSIS — E876 Hypokalemia: Secondary | ICD-10-CM

## 2011-12-15 DIAGNOSIS — I359 Nonrheumatic aortic valve disorder, unspecified: Secondary | ICD-10-CM

## 2011-12-15 LAB — CBC
HCT: 37.4 % — ABNORMAL LOW (ref 39.0–52.0)
Hemoglobin: 12.2 g/dL — ABNORMAL LOW (ref 13.0–17.0)
MCHC: 32.6 g/dL (ref 30.0–36.0)
MCV: 85.4 fL (ref 78.0–100.0)
RDW: 13.3 % (ref 11.5–15.5)

## 2011-12-15 LAB — BASIC METABOLIC PANEL
BUN: 12 mg/dL (ref 6–23)
CO2: 31 mEq/L (ref 19–32)
Chloride: 99 mEq/L (ref 96–112)
Creatinine, Ser: 1.02 mg/dL (ref 0.50–1.35)

## 2011-12-15 MED ORDER — POTASSIUM CHLORIDE 10 MEQ/100ML IV SOLN
10.0000 meq | INTRAVENOUS | Status: AC
  Administered 2011-12-15 (×4): 10 meq via INTRAVENOUS
  Filled 2011-12-15 (×4): qty 100

## 2011-12-15 MED ORDER — POTASSIUM CHLORIDE CRYS ER 20 MEQ PO TBCR
40.0000 meq | EXTENDED_RELEASE_TABLET | Freq: Once | ORAL | Status: AC
  Administered 2011-12-15: 40 meq via ORAL
  Filled 2011-12-15: qty 2

## 2011-12-15 MED ORDER — METOPROLOL TARTRATE 25 MG PO TABS
25.0000 mg | ORAL_TABLET | Freq: Two times a day (BID) | ORAL | Status: DC
  Administered 2011-12-15 – 2011-12-18 (×6): 25 mg via ORAL
  Filled 2011-12-15 (×8): qty 1

## 2011-12-15 NOTE — Care Management Note (Unsigned)
    Page 1 of 1   12/15/2011     5:03:11 PM   CARE MANAGEMENT NOTE 12/15/2011  Patient:  Peter [name], Peter [name]   Account Number:  [id number]  Date Initiated:  12/15/2011  Documentation initiated by:  GRAVES-BIGELOW,Garion Wempe  Subjective/Objective Assessment:   Pt was a transfer from [facility] with Shortness of breath, CT abnormality.     Action/Plan:   CM will contiue to f/u for disposition needs.   Anticipated DC Date:  12/18/2011   Anticipated DC Plan:  [place]  CM consult      Choice offered to / List presented to:             Status of service:  In process, will continue to follow Medicare Important Message given?   (If response is "NO", the following Medicare IM given date fields will be blank) Date Medicare IM given:   Date Additional Medicare IM given:    Discharge Disposition:    Per UR Regulation:  Reviewed for med. necessity/level of care/duration of stay  If discussed at [place] of Stay Meetings, dates discussed:    Comments:

## 2011-12-15 NOTE — Progress Notes (Signed)
Patient ID: JELAN BOWSHER, male   DOB: 26-Oct-1940, 71 y.o.   MRN: [id number]   Patient Name: Peter [name] Date of Encounter: 12/15/2011    SUBJECTIVE  No CP or SOB.  CURRENT MEDS    . amLODipine  10 mg Oral Daily  . irbesartan  150 mg Oral Daily   And  . hydrochlorothiazide  12.5 mg Oral Daily  . metoprolol tartrate  25 mg Oral BID  . niacin  1,000 mg Oral QHS  . pantoprazole  40 mg Oral Q1200  . potassium chloride  10 mEq Intravenous Q1 Hr x 4  . potassium chloride  40 mEq Oral Once  . sodium chloride  3 mL Intravenous Q12H  . sodium chloride  3 mL Intravenous Q12H  . Tamsulosin HCl  0.4 mg Oral QPC breakfast  . DISCONTD: irbesartan-hydrochlorothiazide  1 tablet Oral Daily  . DISCONTD: metoprolol tartrate  12.5 mg Oral BID  . DISCONTD: niacin  1,000 mg Oral QHS    OBJECTIVE  Filed Vitals:   12/14/11 2249 12/15/11 0600 12/15/11 1029 12/15/11 1312  BP: 137/61 147/68 148/75 147/68  Pulse: 69 73 72 85  Temp: 98.1 F (36.7 C) 98.3 F (36.8 C)  98 F (36.7 C)  TempSrc:    Oral  Resp:  18  20  Height:      Weight:      SpO2: 94% 94%  97%    Intake/Output Summary (Last 24 hours) at 12/15/11 1333 Last data filed at 12/15/11 1300  Gross per 24 hour  Intake    360 ml  Output      2 ml  Net    358 ml   Filed Weights   12/14/11 1100  Weight: 235 lb 9.6 oz (106.867 kg)    PHYSICAL EXAM  General: Pleasant, NAD. Neuro: Alert and oriented X 3. Moves all extremities spontaneously. Psych: Normal affect. HEENT:  Normal  Neck: Supple without bruits or JVD. Lungs:  Resp regular and unlabored, CTA. Heart: RRR no s3, s4, or murmurs. Abdomen: Soft, non-tender, non-distended, BS + x 4.  Extremities: No clubbing, cyanosis or edema. DP/PT/Radials 2+ and equal bilaterally.  Accessory Clinical Findings  CBC  Basename 12/15/11 0201  WBC 9.1  NEUTROABS --  HGB 12.2*  HCT 37.4*  MCV 85.4  PLT 0000000   Basic Metabolic Panel  Basename [id number] 0201 12/15/11 0200    NA 138 --  K 2.8* --  CL 99 --  CO2 31 --  GLUCOSE 103* --  BUN 12 --  CREATININE 1.02 --  CALCIUM 8.8 --  MG -- 2.0  PHOS -- --   Liver Function Tests No results found for this basename: AST:2,ALT:2,ALKPHOS:2,BILITOT:2,PROT:2,ALBUMIN:2 in the last 72 hours No results found for this basename: LIPASE:2,AMYLASE:2 in the last 72 hours Cardiac Enzymes  Basename 12/15/11 0201 12/14/11 1947 12/14/11 1518  CKTOTAL -- -- --  CKMB -- -- --  CKMBINDEX -- -- --  TROPONINI <0.30 <0.30 <0.30   BNP No components found with this basename: POCBNP:3 D-Dimer No results found for this basename: DDIMER:2 in the last 72 hours Hemoglobin A1C No results found for this basename: HGBA1C in the last 72 hours Fasting Lipid Panel No results found for this basename: CHOL,HDL,LDLCALC,TRIG,CHOLHDL,LDLDIRECT in the last 72 hours Thyroid Function Tests No results found for this basename: TSH,T4TOTAL,FREET3,T3FREE,THYROIDAB in the last 72 hours  TELE NSR  ECG    Radiology/Studies  No results found.  ASSESSMENT AND PLAN  Principal Problem:  *SOB (  shortness of breath) Active Problems:  CAD (coronary artery disease)  HTN (hypertension)  Hyperlipidemia  GERD (gastroesophageal reflux disease)  BPH (benign prostatic hyperplasia)  Aneurysm of aortic arch  Hypokalemia   The CT scan has arrived from [place]. Dr. Cyndia Bent reviewed. Once he clears [place] to have a catheterization, the patient will agree  to proceed.  I discussed his case this morning with Dr. Cyndia Bent.   Signed, Jenell Milliner MD

## 2011-12-15 NOTE — Progress Notes (Signed)
Nutrition Brief Note  Patient identified on the Malnutrition Screening Tool (MST) report for recent weight loss without trying, generating a score of 2. Patient reports he lost weight during his hospitalization in December 2012, however he's gained almost all the weight back.  Body mass index is 32.86 kg/(m^2). Pt meets criteria for Obesity Class I based on current BMI.   Current diet order is Heart Healthy; reports a good appetite; patient consumed approximately 90% of his lunch tray today. Labs and medications reviewed.   No nutrition interventions warranted at this time. If nutrition issues arise, please consult RD.   Phillips Odor, RD, LDN Pager #: [phone] After-Hours Pager #: [phone]

## 2011-12-15 NOTE — Progress Notes (Signed)
  Echocardiogram 2D Echocardiogram has been performed.  Alvin Critchley 12/15/2011, 10:05 AM

## 2011-12-15 NOTE — Progress Notes (Signed)
UR Completed Zeyna Mkrtchyan Graves-Bigelow, RN,BSN [phone]  

## 2011-12-15 NOTE — Progress Notes (Signed)
[facility]             Progress Note   Subjective: No current complaints. No overnight events.  Objective: Vital signs in last 24 hours: Temp:  [98.1 F (36.7 C)-98.3 F (36.8 C)] 98.3 F (36.8 C) (09/03 0600) Pulse Rate:  [61-73] 72  (09/03 1029) Resp:  [18] 18  (09/03 0600) BP: (128-158)/(61-75) 148/75 mmHg (09/03 1029) SpO2:  [94 %-97 %] 94 % (09/03 0600) Weight:  [106.867 kg (235 lb 9.6 oz)] 106.867 kg (235 lb 9.6 oz) (09/02 1100) Weight change:  Last BM Date: 12/13/11  Intake/Output from previous day:       Physical Exam: General: Alert, awake, oriented x3, in no acute distress. HEENT: No bruits, no goiter. Heart: Regular rate and rhythm, soft SEM. Lungs: Clear to auscultation bilaterally. Abdomen: Soft, nontender, nondistended, positive bowel sounds. Extremities: No clubbing cyanosis or edema with positive pedal pulses. Neuro: Grossly intact, nonfocal.    Lab Results: Basic Metabolic Panel:  Basename 12/15/11 0201 12/15/11 0200  NA 138 --  K 2.8* --  CL 99 --  CO2 31 --  GLUCOSE 103* --  BUN 12 --  CREATININE 1.02 --  CALCIUM 8.8 --  MG -- 2.0  PHOS -- --   CBC:  Basename 12/15/11 0201  WBC 9.1  NEUTROABS --  HGB 12.2*  HCT 37.4*  MCV 85.4  PLT 193   Cardiac Enzymes:  Basename 12/15/11 0201 12/14/11 1947 12/14/11 1518  CKTOTAL -- -- --  CKMB -- -- --  CKMBINDEX -- -- --  TROPONINI <0.30 <0.30 <0.30    Studies/Results: No results found.  Medications: Scheduled Meds:    . amLODipine  10 mg Oral Daily  . irbesartan  150 mg Oral Daily   And  . hydrochlorothiazide  12.5 mg Oral Daily  . metoprolol tartrate  12.5 mg Oral BID  . niacin  1,000 mg Oral QHS  . pantoprazole  40 mg Oral Q1200  . potassium chloride  10 mEq Intravenous Q1 Hr x 4  . sodium chloride  3 mL Intravenous Q12H  . sodium chloride  3 mL Intravenous Q12H  . Tamsulosin HCl  0.4 mg Oral QPC breakfast  . DISCONTD: irbesartan-hydrochlorothiazide  1  tablet Oral Daily  . DISCONTD: niacin  1,000 mg Oral QHS   Continuous Infusions:  PRN Meds:.sodium chloride, acetaminophen, acetaminophen, morphine injection, ondansetron (ZOFRAN) IV, ondansetron, oxyCODONE, senna-docusate, sodium chloride  Assessment/Plan:  Principal Problem:  *SOB (shortness of breath) Active Problems:  CAD (coronary artery disease)  HTN (hypertension)  Hyperlipidemia  GERD (gastroesophageal reflux disease)  BPH (benign prostatic hyperplasia)  Aneurysm of aortic arch  Hypokalemia   #1 Abnormal aortic arch CT findings: [facility] brought the CD with images (in [place]). Awaiting eval by Dr. Cyndia Bent to determine best course of action.  #2 Elevated troponins: LB cards following. I wonder whether he may need further workup. Troponins have normalized. Not currently complaining of CP/SOB.  He does have a h/o CAD with stent placement most recently about 5 years ago. Not on antiplatelet/anticoagulation therapy until we are assured he does not have an aortic aneurysm/dissection.  #3 HTN: BP not optimally controlled in someone who may have an aortic aneurysm. Increase metoprolol to 25 mg BID. Follow BP.  #4 Hyperlipidemia: Check FLP. Continue niacin.  #5 Hypokalemia: repleting IV. Will also give him some PO KCl.  Mag ok at 2.0.   LOS: 1 day   [facility] Pager: [phone] 12/15/2011, 10:59 AM

## 2011-12-15 NOTE — Progress Notes (Signed)
  Subjective: No complaints   Objective: Vital signs in last 24 hours: Temp:  [98 F (36.7 C)-98.3 F (36.8 C)] 98 F (36.7 C) (09/03 1312) Pulse Rate:  [69-85] 85  (09/03 1312) Cardiac Rhythm:  [-] Normal sinus rhythm (09/03 0824) Resp:  [18-20] 20  (09/03 1312) BP: (137-148)/(61-75) 147/68 mmHg (09/03 1312) SpO2:  [94 %-97 %] 97 % (09/03 1312)  Hemodynamic parameters for last 24 hours:    Intake/Output from previous day:   Intake/Output this shift: Total I/O In: 600 [P.O.:600] Out: 2 [Urine:1; Stool:1]  General appearance: alert and cooperative Heart: regular rate and rhythm Lungs: clear to auscultation bilaterally  Lab Results:  Basename 12/15/11 0201  WBC 9.1  HGB 12.2*  HCT 37.4*  PLT 193   BMET:  Basename 12/15/11 1311 12/15/11 0201  NA -- 138  K 3.2* 2.8*  CL -- 99  CO2 -- 31  GLUCOSE -- 103*  BUN -- 12  CREATININE -- 1.02  CALCIUM -- 8.8    PT/INR: No results found for this basename: LABPROT,INR in the last 72 hours ABG No results found for this basename: phart, pco2, po2, hco3, tco2, acidbasedef, o2sat   CBG (last 3)  No results found for this basename: GLUCAP:3 in the last 72 hours  Assessment/Plan:  CT scan from [place] reviewed this evening. There is atherosclerotic plaque in the aortic arch and descending aorta but no aneurysm, no penetrating plaques, no fluid outside the aorta or mural hematoma. There is no dissection. I think this was an overread by the radiologist and does not require followup. I don't see any contraindication to cardiac cath. He says he does have a AAA followed by a vascular surgeon in [place] that is 4.8 cm. I discussed the CT scan with the patient and he is inclined to proceed with cardiac cath here tomorrow.   LOS: 1 day    BARTLE,BRYAN K 12/15/2011

## 2011-12-16 ENCOUNTER — Encounter (HOSPITAL_COMMUNITY): Admission: AD | Disposition: A | Discharge status: Home / Self Care | Payer: Self-pay | Attending: Internal Medicine

## 2011-12-16 DIAGNOSIS — I2510 Atherosclerotic heart disease of native coronary artery without angina pectoris: Secondary | ICD-10-CM

## 2011-12-16 HISTORY — PX: LEFT HEART CATHETERIZATION WITH CORONARY ANGIOGRAM: SHX5451

## 2011-12-16 LAB — BASIC METABOLIC PANEL
BUN: 10 mg/dL (ref 6–23)
Calcium: 9 mg/dL (ref 8.4–10.5)
GFR calc Af Amer: >90 mL/min (ref >90)
GFR calc non Af Amer: 81 mL/min — ABNORMAL LOW (ref >90)
Potassium: 3.4 mEq/L — ABNORMAL LOW (ref 3.5–5.1)
Sodium: 143 mEq/L (ref 135–145)

## 2011-12-16 LAB — LIPID PANEL
Cholesterol: 141 mg/dL (ref 0–200)
HDL: 38 mg/dL — ABNORMAL LOW (ref >39)
Total CHOL/HDL Ratio: 3.7 RATIO
VLDL: 20 mg/dL (ref 0–40)

## 2011-12-16 LAB — PROTIME-INR
INR: 1.08 (ref 0.00–1.49)
Prothrombin Time: 14.2 seconds (ref 11.6–15.2)

## 2011-12-16 LAB — CBC
Hemoglobin: 12.9 g/dL — ABNORMAL LOW (ref 13.0–17.0)
MCH: 28 pg (ref 26.0–34.0)
MCHC: 33.1 g/dL (ref 30.0–36.0)
Platelets: 189 10*3/uL (ref 150–400)

## 2011-12-16 SURGERY — LEFT HEART CATHETERIZATION WITH CORONARY ANGIOGRAM
Anesthesia: LOCAL

## 2011-12-16 MED ORDER — VERAPAMIL HCL 2.5 MG/ML IV SOLN
INTRAVENOUS | Status: AC
  Filled 2011-12-16: qty 2

## 2011-12-16 MED ORDER — HEPARIN (PORCINE) IN NACL 2-0.9 UNIT/ML-% IJ SOLN
INTRAMUSCULAR | Status: AC
  Filled 2011-12-16: qty 2000

## 2011-12-16 MED ORDER — SODIUM CHLORIDE 0.9 % IV SOLN
INTRAVENOUS | Status: DC
  Administered 2011-12-16: 11:00:00 via INTRAVENOUS

## 2011-12-16 MED ORDER — SODIUM CHLORIDE 0.9 % IJ SOLN: 3.0000 mL | Freq: Two times a day (BID) | INTRAMUSCULAR | Status: DC

## 2011-12-16 MED ORDER — ASPIRIN 81 MG PO CHEW: 324.0000 mg | CHEWABLE_TABLET | ORAL | Status: DC

## 2011-12-16 MED ORDER — SODIUM CHLORIDE 0.9 % IV SOLN: 1.0000 mL/kg/h | INTRAVENOUS | Status: DC

## 2011-12-16 MED ORDER — HEPARIN (PORCINE) IN NACL 100-0.45 UNIT/ML-% IJ SOLN
1700.0000 [IU]/h | INTRAMUSCULAR | Status: DC
  Administered 2011-12-17: 1700 [IU]/h via INTRAVENOUS
  Administered 2011-12-17: 1400 [IU]/h via INTRAVENOUS
  Filled 2011-12-16 (×4): qty 250

## 2011-12-16 MED ORDER — SODIUM CHLORIDE 0.9 % IV SOLN: 250.0000 mL | INTRAVENOUS | Status: DC | PRN

## 2011-12-16 MED ORDER — MIDAZOLAM HCL 2 MG/2ML IJ SOLN
INTRAMUSCULAR | Status: AC
  Filled 2011-12-16: qty 2

## 2011-12-16 MED ORDER — SODIUM CHLORIDE 0.9 % IV SOLN
INTRAVENOUS | Status: AC
  Administered 2011-12-17: via INTRAVENOUS

## 2011-12-16 MED ORDER — HEPARIN SODIUM (PORCINE) 1000 UNIT/ML IJ SOLN
INTRAMUSCULAR | Status: AC
  Filled 2011-12-16: qty 1

## 2011-12-16 MED ORDER — CLOPIDOGREL BISULFATE 75 MG PO TABS
300.0000 mg | ORAL_TABLET | Freq: Once | ORAL | Status: DC
  Administered 2011-12-16: 300 mg via ORAL

## 2011-12-16 MED ORDER — SODIUM CHLORIDE 0.9 % IJ SOLN: 3.0000 mL | INTRAMUSCULAR | Status: DC | PRN

## 2011-12-16 MED ORDER — CLOPIDOGREL BISULFATE 75 MG PO TABS
75.0000 mg | ORAL_TABLET | Freq: Every day | ORAL | Status: DC
  Administered 2011-12-17 – 2011-12-18 (×2): 75 mg via ORAL
  Filled 2011-12-16 (×2): qty 1

## 2011-12-16 MED ORDER — ONDANSETRON HCL 4 MG/2ML IJ SOLN: 4.0000 mg | Freq: Four times a day (QID) | INTRAMUSCULAR | Status: DC | PRN

## 2011-12-16 MED ORDER — ACETAMINOPHEN 325 MG PO TABS
650.0000 mg | ORAL_TABLET | ORAL | Status: DC | PRN
  Filled 2011-12-16: qty 1

## 2011-12-16 MED ORDER — POTASSIUM CHLORIDE CRYS ER 20 MEQ PO TBCR
20.0000 meq | EXTENDED_RELEASE_TABLET | Freq: Once | ORAL | Status: AC
  Administered 2011-12-16: 20 meq via ORAL
  Filled 2011-12-16: qty 1

## 2011-12-16 MED ORDER — NITROGLYCERIN 0.2 MG/ML ON CALL CATH LAB
INTRAVENOUS | Status: AC
  Filled 2011-12-16: qty 1

## 2011-12-16 MED ORDER — ASPIRIN 81 MG PO CHEW
324.0000 mg | CHEWABLE_TABLET | ORAL | Status: AC
  Administered 2011-12-16: 324 mg via ORAL
  Filled 2011-12-16: qty 4

## 2011-12-16 MED ORDER — LIDOCAINE HCL (PF) 1 % IJ SOLN
INTRAMUSCULAR | Status: AC
  Filled 2011-12-16: qty 30

## 2011-12-16 NOTE — Interval H&P Note (Signed)
History and Physical Interval Note:  12/16/2011 4:43 PM  Peter [name]  has presented today for surgery, with the diagnosis of cp  The various methods of treatment have been discussed with the patient and family. After consideration of risks, benefits and other options for treatment, the patient has consented to  Procedure(s) (LRB) with comments: LEFT HEART CATHETERIZATION WITH CORONARY ANGIOGRAM (N/A) as a surgical intervention .  The patient's history has been reviewed, patient examined, no change in status, stable for surgery.  I have reviewed the patient's chart and labs.  Questions were answered to the patient's satisfaction.     Minus Breeding

## 2011-12-16 NOTE — H&P (View-Only) (Signed)
Patient ID: THADDEUS LINN, male   DOB: Jan 07, 1941, 71 y.o.   MRN: [id number]   Patient Name: Peter [name] Date of Encounter: 12/16/2011    SUBJECTIVE  Wants to proceed with cath. Dr Cyndia Bent has cleared.  CURRENT MEDS    . amLODipine  10 mg Oral Daily  . irbesartan  150 mg Oral Daily   And  . hydrochlorothiazide  12.5 mg Oral Daily  . metoprolol tartrate  25 mg Oral BID  . niacin  1,000 mg Oral QHS  . pantoprazole  40 mg Oral Q1200  . potassium chloride  10 mEq Intravenous Q1 Hr x 4  . potassium chloride  40 mEq Oral Once  . sodium chloride  3 mL Intravenous Q12H  . sodium chloride  3 mL Intravenous Q12H  . Tamsulosin HCl  0.4 mg Oral QPC breakfast  . DISCONTD: metoprolol tartrate  12.5 mg Oral BID    OBJECTIVE  Filed Vitals:   12/15/11 1029 12/15/11 1312 12/15/11 2117 12/16/11 0500  BP: 148/75 147/68 157/81 160/70  Pulse: 72 85 71 61  Temp:  98 F (36.7 C) 97.7 F (36.5 C) 98.7 F (37.1 C)  TempSrc:  Oral Oral Oral  Resp:  20 18 20   Height:      Weight:      SpO2:  97% 95% 95%    Intake/Output Summary (Last 24 hours) at 12/16/11 0844 Last data filed at 12/15/11 1700  Gross per 24 hour  Intake    600 ml  Output      2 ml  Net    598 ml   Filed Weights   12/14/11 1100  Weight: 235 lb 9.6 oz (106.867 kg)    PHYSICAL EXAM  General: Pleasant, NAD. Neuro: Alert and oriented X 3. Moves all extremities spontaneously. Psych: Normal affect. HEENT:  Normal  Neck: Supple without bruits or JVD. Lungs:  Resp regular and unlabored, CTA. Heart: RRR no s3, s4, or murmurs. Abdomen: Soft, non-tender, non-distended, BS + x 4.  Extremities: No clubbing, cyanosis or edema. DP/PT/Radials 2+ and equal bilaterally.  Accessory Clinical Findings  CBC  Basename 12/16/11 0545 12/15/11 0201  WBC 8.0 9.1  NEUTROABS -- --  HGB 12.9* 12.2*  HCT 39.0 37.4*  MCV 84.6 85.4  PLT 189 0000000   Basic Metabolic Panel  Basename [id number] 0545 12/15/11 1311 12/15/11 0201  12/15/11 0200  NA 143 -- 138 --  K 3.4* 3.2* -- --  CL 104 -- 99 --  CO2 31 -- 31 --  GLUCOSE 125* -- 103* --  BUN 10 -- 12 --  CREATININE 0.97 -- 1.02 --  CALCIUM 9.0 -- 8.8 --  MG -- -- -- 2.0  PHOS -- -- -- --   Liver Function Tests No results found for this basename: AST:2,ALT:2,ALKPHOS:2,BILITOT:2,PROT:2,ALBUMIN:2 in the last 72 hours No results found for this basename: LIPASE:2,AMYLASE:2 in the last 72 hours Cardiac Enzymes  Basename 12/15/11 0201 12/14/11 1947 12/14/11 1518  CKTOTAL -- -- --  CKMB -- -- --  CKMBINDEX -- -- --  TROPONINI <0.30 <0.30 <0.30   BNP No components found with this basename: POCBNP:3 D-Dimer No results found for this basename: DDIMER:2 in the last 72 hours Hemoglobin A1C No results found for this basename: HGBA1C in the last 72 hours Fasting Lipid Panel  Basename 12/16/11 0545  CHOL 141  HDL 38*  LDLCALC 83  TRIG 102  CHOLHDL 3.7  LDLDIRECT --   Thyroid Function Tests No results found for  this basename: TSH,T4TOTAL,FREET3,T3FREE,THYROIDAB in the last 72 hours  TELE NSR  ECG    Radiology/Studies  No results found.  ASSESSMENT AND PLAN  Principal Problem:  *SOB (shortness of breath) Active Problems:  CAD (coronary artery disease)  HTN (hypertension)  Hyperlipidemia  GERD (gastroesophageal reflux disease)  BPH (benign prostatic hyperplasia)  Aneurysm of aortic arch  Hypokalemia   Cath today for NSTEMI. Will supplement K.  Signed, Jenell Milliner MD

## 2011-12-16 NOTE — Progress Notes (Signed)
Patient ID: Peter [name], male   DOB: 1940-07-27, 71 y.o.   MRN: [id number]   Patient Name: Peter [name] Date of Encounter: 12/16/2011    SUBJECTIVE  Wants to proceed with cath. Dr Cyndia Bent has cleared.  CURRENT MEDS    . amLODipine  10 mg Oral Daily  . irbesartan  150 mg Oral Daily   And  . hydrochlorothiazide  12.5 mg Oral Daily  . metoprolol tartrate  25 mg Oral BID  . niacin  1,000 mg Oral QHS  . pantoprazole  40 mg Oral Q1200  . potassium chloride  10 mEq Intravenous Q1 Hr x 4  . potassium chloride  40 mEq Oral Once  . sodium chloride  3 mL Intravenous Q12H  . sodium chloride  3 mL Intravenous Q12H  . Tamsulosin HCl  0.4 mg Oral QPC breakfast  . DISCONTD: metoprolol tartrate  12.5 mg Oral BID    OBJECTIVE  Filed Vitals:   12/15/11 1029 12/15/11 1312 12/15/11 2117 12/16/11 0500  BP: 148/75 147/68 157/81 160/70  Pulse: 72 85 71 61  Temp:  98 F (36.7 C) 97.7 F (36.5 C) 98.7 F (37.1 C)  TempSrc:  Oral Oral Oral  Resp:  20 18 20   Height:      Weight:      SpO2:  97% 95% 95%    Intake/Output Summary (Last 24 hours) at 12/16/11 0844 Last data filed at 12/15/11 1700  Gross per 24 hour  Intake    600 ml  Output      2 ml  Net    598 ml   Filed Weights   12/14/11 1100  Weight: 235 lb 9.6 oz (106.867 kg)    PHYSICAL EXAM  General: Pleasant, NAD. Neuro: Alert and oriented X 3. Moves all extremities spontaneously. Psych: Normal affect. HEENT:  Normal  Neck: Supple without bruits or JVD. Lungs:  Resp regular and unlabored, CTA. Heart: RRR no s3, s4, or murmurs. Abdomen: Soft, non-tender, non-distended, BS + x 4.  Extremities: No clubbing, cyanosis or edema. DP/PT/Radials 2+ and equal bilaterally.  Accessory Clinical Findings  CBC  Basename 12/16/11 0545 12/15/11 0201  WBC 8.0 9.1  NEUTROABS -- --  HGB 12.9* 12.2*  HCT 39.0 37.4*  MCV 84.6 85.4  PLT 189 0000000   Basic Metabolic Panel  Basename [id number] 0545 12/15/11 1311 12/15/11 0201  12/15/11 0200  NA 143 -- 138 --  K 3.4* 3.2* -- --  CL 104 -- 99 --  CO2 31 -- 31 --  GLUCOSE 125* -- 103* --  BUN 10 -- 12 --  CREATININE 0.97 -- 1.02 --  CALCIUM 9.0 -- 8.8 --  MG -- -- -- 2.0  PHOS -- -- -- --   Liver Function Tests No results found for this basename: AST:2,ALT:2,ALKPHOS:2,BILITOT:2,PROT:2,ALBUMIN:2 in the last 72 hours No results found for this basename: LIPASE:2,AMYLASE:2 in the last 72 hours Cardiac Enzymes  Basename 12/15/11 0201 12/14/11 1947 12/14/11 1518  CKTOTAL -- -- --  CKMB -- -- --  CKMBINDEX -- -- --  TROPONINI <0.30 <0.30 <0.30   BNP No components found with this basename: POCBNP:3 D-Dimer No results found for this basename: DDIMER:2 in the last 72 hours Hemoglobin A1C No results found for this basename: HGBA1C in the last 72 hours Fasting Lipid Panel  Basename 12/16/11 0545  CHOL 141  HDL 38*  LDLCALC 83  TRIG 102  CHOLHDL 3.7  LDLDIRECT --   Thyroid Function Tests No results found for  this basename: TSH,T4TOTAL,FREET3,T3FREE,THYROIDAB in the last 72 hours  TELE NSR  ECG    Radiology/Studies  No results found.  ASSESSMENT AND PLAN  Principal Problem:  *SOB (shortness of breath) Active Problems:  CAD (coronary artery disease)  HTN (hypertension)  Hyperlipidemia  GERD (gastroesophageal reflux disease)  BPH (benign prostatic hyperplasia)  Aneurysm of aortic arch  Hypokalemia   Cath today for NSTEMI. Will supplement K.  Signed, Jenell Milliner MD

## 2011-12-16 NOTE — CV Procedure (Signed)
.   Cardiac Catheterization Procedure Note  Name: Peter [name] MRN: [id number] DOB: Apr 06, 1941  Procedure: Left Heart Cath, Selective Coronary Angiography, LV angiography  Indication: NQWMI with previous stenting of the LAD and RI  Procedural details: The right radial was prepped, draped, and anesthetized with 1% lidocaine. Using modified Seldinger technique, a 5 French sheath was introduced into the right radial artery. Standard Judkins catheters were used for coronary angiography and left ventriculography. Catheter exchanges were performed over a guidewire. There were no immediate procedural complications. The patient was transferred to the post catheterization recovery area for further monitoring.  Procedural Findings:   Hemodynamics:     AO 151/75    LV 149/22   Coronary angiography:   Coronary dominance: Right  Left mainstem:   Normal.  Left anterior descending (LAD):   Proximal stent widely patent with mild luminal irregularities.  D1 and D2 small and normal.    Ramus intermedius (RI):  Large.  Proximal 99% within a previous stented area.    Left circumflex (LCx):  AV groove large with mid 60% at the take off of an OM.  The MOM is very large with long 25%.  Posterolateral small and normal  Right coronary artery (RCA):  Long proximal 30%, Mid long 30%.  PDA large and normal.    Left ventriculography: Left ventricular systolic function is normal, LVEF is estimated at 65%, there is no significant mitral regurgitation   Final Conclusions:  Severe single vessel CAD in a previously stented area.  Normal LV function.    Recommendations:   The patient will have PCI of the RI.  Minus Breeding 12/16/2011, 5:48 PM

## 2011-12-16 NOTE — Progress Notes (Signed)
ANTICOAGULATION CONSULT NOTE - Initial Consult  Pharmacy Consult for heparin to start 4 hours after radial sheath out per Dr. Percival Spanish Indication: chest pain/ACS  No Known Allergies  Patient Measurements: Height: 5\' 11"  (180.3 cm) Weight: 235 lb 9.6 oz (106.867 kg) IBW/kg (Calculated) : 75.3    Vital Signs: Temp: 98.6 F (37 C) (09/04 1424) Temp src: Oral (09/04 1424) BP: 138/77 mmHg (09/04 1424) Pulse Rate: 64  (09/04 1424)  Labs:  Basename 12/16/11 0950 12/16/11 0545 12/15/11 0201 12/14/11 1947 12/14/11 1518  HGB -- 12.9* 12.2* -- --  HCT -- 39.0 37.4* -- --  PLT -- 189 193 -- --  APTT -- -- -- -- --  LABPROT 14.2 -- -- -- --  INR 1.08 -- -- -- --  HEPARINUNFRC -- -- -- -- --  CREATININE -- 0.97 1.02 -- --  CKTOTAL -- -- -- -- --  CKMB -- -- -- -- --  TROPONINI -- -- <0.30 <0.30 <0.30    Estimated Creatinine Clearance: 86.8 ml/min (by C-G formula based on Cr of 0.97).   Medical History: Past Medical History  Diagnosis Date  . Hypertension   . Asthma   . 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 High cholesterol   . Anginal pain   . Coronary artery disease   . Shortness of breath 12/14/2011    "@ rest, lying down, w/exertion"  . Bladder infection, acute 03/2011    "had a whole lot of bleeding from this"  . Myocardial infarction 1996  . NSTEMI (non-ST elevated myocardial infarction) 12/14/2011    Medications:  Prescriptions prior to admission  Medication Sig Dispense Refill  . amLODipine-benazepril (LOTREL) 10-20 MG per capsule Take 1 capsule by mouth daily.        Marland Kitchen aspirin 325 MG tablet Take 325 mg by mouth as needed. Patient takes if there is any pain or burning in the chest.      . irbesartan-hydrochlorothiazide (AVALIDE) 150-12.5 MG per tablet Take 1 tablet by mouth daily.        . Multiple Vitamins-Minerals (ICAPS PO) Take 1 capsule by mouth daily.      . niacin (NIASPAN) 500 MG CR tablet Take 1,000 mg by  mouth every other day.      Marland Kitchen omeprazole (PRILOSEC) 10 MG capsule Take 10 mg by mouth daily.        . potassium chloride SA (K-DUR,KLOR-CON) 20 MEQ tablet Take 40 mEq by mouth 2 (two) times daily.      . Tamsulosin HCl (FLOMAX) 0.4 MG CAPS Take 0.4 mg by mouth daily after breakfast.         Assessment: 71 yo man s/p cath today and scheduled for PCI of RI for 9/4 at 1500.  Discussed with Dr. Percival Spanish.  Heparin to start 4 hours after radial sheath out.  Per RN, Larene Beach, band is due to be removed at 1930, so will start heparin with no bolus at 2330.   Goal of Therapy:  Heparin level 0.3-0.7 units/ml Monitor platelets by anticoagulation protocol: Yes   Plan:  Start heparin infusion at 1400 units/hr Check anti-Xa level in 8 hours and daily while on heparin Continue to monitor H&H and platelets Eudelia Bunch, Pharm.D. [id number] 12/16/2011 6:39 PM

## 2011-12-16 NOTE — Progress Notes (Signed)
[facility] PROGRESS NOTE  Peter [name] [id number] DOB: Oct 03, 1940 DOA: 12/14/2011 PCP: Pcp Not In System  Assessment/Plan: Principal Problem:  *SOB (shortness of breath) Active Problems:  CAD (coronary artery disease)  HTN (hypertension)  Hyperlipidemia  GERD (gastroesophageal reflux disease)  BPH (benign prostatic hyperplasia)  Aneurysm of aortic arch  Hypokalemia  1. Precordial pain: Question non-STEMI.  For cath today. 2. ?Aneurysm of the aortic arch patient seen by cardiothoracic surgery who feels there is plaque but no aneurysm. Nor hematoma or dissection. Likely this is radiologic interpretation. 3. History of AAA-followed by vascular surgery in [place] currently at 4.8 cm. 4. Hypertension: Stable. 5. BPH: Stable.  Code Status: Full code Family Communication: Plan discussed with patient's wife in the room Disposition Plan: Discharge to home when cleared   Brief narrative: 71 year old white male past history of CAD with stent x3, hypertension and BPH who presented to [facility] with complaints of shortness of breath and found to have mildly elevated troponins (0.08-0.11) as well as a CT which noted abnormal appearance of the left lateral 2 inferolateral aortic arch concerning for ulceration versus contained hemorrhage. Patient was transferred to [facility] for evaluation by cardiothoracic surgery.  Consultants:  Bartle-cardiothoracic surgery  Wall-cardiology  Procedures:  Cardiac catheterization-12/16/11-pending  Antibiotics:  None  HPI/Subjective: Patient with no complaints. No shortness of breath or chest pain.  Objective: Filed Vitals:   12/15/11 2117 12/16/11 0500 12/16/11 1122 12/16/11 1424  BP: 157/81 160/70 156/71 138/77  Pulse: 71 61 70 64  Temp: 97.7 F (36.5 C) 98.7 F (37.1 C)  98.6 F (37 C)  TempSrc: Oral Oral  Oral  Resp: 18 20  18   Height:      Weight:      SpO2: 95% 95%  97%    Intake/Output Summary (Last 24  hours) at 12/16/11 1654 Last data filed at 12/15/11 1700  Gross per 24 hour  Intake    240 ml  Output      0 ml  Net    240 ml   Filed Weights   12/14/11 1100  Weight: 106.867 kg (235 lb 9.6 oz)    Exam:   General:  Alert and oriented x3, no acute distress, looks younger than stated age  Cardiovascular: Regular rate and rhythm, S1-S2  Respiratory:  Clear to auscultation bilaterally  Abdomen: Soft, nontender, nondistended, positive bowel sounds  Extremities: No clubbing or cyanosis or edema  Data Reviewed: Basic Metabolic Panel:  Lab [id number] 0545 12/15/11 1311 12/15/11 0201 12/15/11 0200  NA 143 -- 138 --  K 3.4* 3.2* 2.8* --  CL 104 -- 99 --  CO2 31 -- 31 --  GLUCOSE 125* -- 103* --  BUN 10 -- 12 --  CREATININE 0.97 -- 1.02 --  CALCIUM 9.0 -- 8.8 --  MG -- -- -- 2.0  PHOS -- -- -- --   CBC:  Lab 12/16/11 0545 12/15/11 0201  WBC 8.0 9.1  NEUTROABS -- --  HGB 12.9* 12.2*  HCT 39.0 37.4*  MCV 84.6 85.4  PLT 189 193   Cardiac Enzymes:  Lab 12/15/11 0201 12/14/11 1947 12/14/11 1518  CKTOTAL -- -- --  CKMB -- -- --  CKMBINDEX -- -- --  TROPONINI <0.30 <0.30 <0.30     Studies: No results found.  Scheduled Meds:   . amLODipine  10 mg Oral Daily  . aspirin  324 mg Oral Pre-Cath  . heparin      . irbesartan  150 mg Oral  Daily   And  . hydrochlorothiazide  12.5 mg Oral Daily  . lidocaine      . metoprolol tartrate  25 mg Oral BID  . niacin  1,000 mg Oral QHS  . nitroGLYCERIN      . pantoprazole  40 mg Oral Q1200  . potassium chloride  20 mEq Oral Once  . sodium chloride  3 mL Intravenous Q12H  . sodium chloride  3 mL Intravenous Q12H  . sodium chloride  3 mL Intravenous Q12H  . Tamsulosin HCl  0.4 mg Oral QPC breakfast   Continuous Infusions:   . sodium chloride 20 mL/hr at 12/16/11 1125     Time spent: 20 minutes    [facility] Pager [phone]. If 8PM-8AM, please contact night-coverage at [email or web address],  password [facility] 12/16/2011, 4:54 PM  LOS: 2 days

## 2011-12-17 ENCOUNTER — Encounter (HOSPITAL_COMMUNITY): Admission: AD | Disposition: A | Discharge status: Home / Self Care | Payer: Self-pay | Attending: Internal Medicine

## 2011-12-17 DIAGNOSIS — I214 Non-ST elevation (NSTEMI) myocardial infarction: Principal | ICD-10-CM

## 2011-12-17 DIAGNOSIS — I2510 Atherosclerotic heart disease of native coronary artery without angina pectoris: Secondary | ICD-10-CM

## 2011-12-17 DIAGNOSIS — I10 Essential (primary) hypertension: Secondary | ICD-10-CM

## 2011-12-17 DIAGNOSIS — N400 Enlarged prostate without lower urinary tract symptoms: Secondary | ICD-10-CM

## 2011-12-17 HISTORY — PX: PERCUTANEOUS CORONARY STENT INTERVENTION (PCI-S): SHX5485

## 2011-12-17 LAB — BASIC METABOLIC PANEL
BUN: 12 mg/dL (ref 6–23)
CO2: 30 mEq/L (ref 19–32)
Chloride: 102 mEq/L (ref 96–112)
Creatinine, Ser: 0.88 mg/dL (ref 0.50–1.35)
Creatinine, Ser: 0.96 mg/dL (ref 0.50–1.35)
GFR calc Af Amer: >90 mL/min (ref >90)
GFR calc non Af Amer: 84 mL/min — ABNORMAL LOW (ref >90)
Potassium: 3.3 mEq/L — ABNORMAL LOW (ref 3.5–5.1)

## 2011-12-17 LAB — CBC
HCT: 40.8 % (ref 39.0–52.0)
MCHC: 33.6 g/dL (ref 30.0–36.0)
RDW: 13.1 % (ref 11.5–15.5)

## 2011-12-17 LAB — MRSA PCR SCREENING: MRSA by PCR: NEGATIVE

## 2011-12-17 SURGERY — PERCUTANEOUS CORONARY STENT INTERVENTION (PCI-S)
Anesthesia: LOCAL

## 2011-12-17 MED ORDER — ASPIRIN 81 MG PO CHEW: 324.0000 mg | CHEWABLE_TABLET | ORAL | Status: DC

## 2011-12-17 MED ORDER — ONDANSETRON HCL 4 MG/2ML IJ SOLN: 4.0000 mg | Freq: Four times a day (QID) | INTRAMUSCULAR | Status: DC | PRN

## 2011-12-17 MED ORDER — SODIUM CHLORIDE 0.9 % IJ SOLN: 3.0000 mL | Freq: Two times a day (BID) | INTRAMUSCULAR | Status: DC

## 2011-12-17 MED ORDER — POTASSIUM CHLORIDE 20 MEQ/15ML (10%) PO LIQD
40.0000 meq | Freq: Once | ORAL | Status: AC
  Administered 2011-12-17: 40 meq via ORAL
  Filled 2011-12-17: qty 30

## 2011-12-17 MED ORDER — POTASSIUM CHLORIDE CRYS ER 20 MEQ PO TBCR
40.0000 meq | EXTENDED_RELEASE_TABLET | Freq: Once | ORAL | Status: AC
  Administered 2011-12-17: 40 meq via ORAL
  Filled 2011-12-17: qty 2

## 2011-12-17 MED ORDER — BIVALIRUDIN 250 MG IV SOLR
INTRAVENOUS | Status: AC
  Filled 2011-12-17: qty 250

## 2011-12-17 MED ORDER — SODIUM CHLORIDE 0.9 % IV SOLN: 250.0000 mL | INTRAVENOUS | Status: DC

## 2011-12-17 MED ORDER — LIDOCAINE HCL (PF) 1 % IJ SOLN
INTRAMUSCULAR | Status: AC
  Filled 2011-12-17: qty 30

## 2011-12-17 MED ORDER — SODIUM CHLORIDE 0.9 % IV SOLN: 250.0000 mL | INTRAVENOUS | Status: DC | PRN

## 2011-12-17 MED ORDER — HEPARIN (PORCINE) IN NACL 2-0.9 UNIT/ML-% IJ SOLN
INTRAMUSCULAR | Status: AC
  Filled 2011-12-17: qty 2000

## 2011-12-17 MED ORDER — MIDAZOLAM HCL 2 MG/2ML IJ SOLN
INTRAMUSCULAR | Status: AC
  Filled 2011-12-17: qty 2

## 2011-12-17 MED ORDER — NITROGLYCERIN 0.2 MG/ML ON CALL CATH LAB
INTRAVENOUS | Status: AC
  Filled 2011-12-17: qty 1

## 2011-12-17 MED ORDER — NIACIN ER 500 MG PO CPCR
1000.0000 mg | ORAL_CAPSULE | ORAL | Status: DC
  Administered 2011-12-17: 1000 mg via ORAL
  Filled 2011-12-17 (×2): qty 2

## 2011-12-17 MED ORDER — SODIUM CHLORIDE 0.9 % IJ SOLN: 3.0000 mL | INTRAMUSCULAR | Status: DC | PRN

## 2011-12-17 MED ORDER — SODIUM CHLORIDE 0.9 % IV SOLN: INTRAVENOUS | Status: DC

## 2011-12-17 MED ORDER — SODIUM CHLORIDE 0.9 % IV SOLN: 1.0000 mL/kg/h | INTRAVENOUS | Status: AC

## 2011-12-17 MED ORDER — ACETAMINOPHEN 325 MG PO TABS: 650.0000 mg | ORAL_TABLET | ORAL | Status: DC | PRN

## 2011-12-17 MED ORDER — FENTANYL CITRATE 0.05 MG/ML IJ SOLN
INTRAMUSCULAR | Status: AC
  Filled 2011-12-17: qty 2

## 2011-12-17 MED ORDER — VERAPAMIL HCL 2.5 MG/ML IV SOLN
INTRAVENOUS | Status: AC
  Filled 2011-12-17: qty 2

## 2011-12-17 NOTE — CV Procedure (Signed)
   CARDIAC CATH NOTE  Name: Peter [name] MRN: [id number] DOB: 1940-07-01  Procedure: PTCA and stenting of the ramus intermedius  Indication: Non-STEMI. 71 year old gentleman who underwent diagnostic catheterization yesterday. He has had past PCI procedures. He was noted to have critical 95-99% stenosis of the ramus intermedius. He presents today for PCI. He has been loaded with 600 mg of Plavix.  Procedural Details: The right wrist was prepped, draped, and anesthetized with 1% lidocaine. Using the modified Seldinger technique, a 6 Fr sheath was introduced into the radial artery. 3 mg verapamil was administered through the radial sheath. Weight-based bivalirudin was given for anticoagulation. Once a therapeutic ACT was achieved, a 6 [place] XB 3.570 guide catheter was inserted.  A cougar coronary guidewire was used to cross the lesion.  The lesion was predilated with a 2.5 x 15 mm balloon.  Intravascular ultrasound was then performed and mechanical pullback was done. I used intravascular ultrasound because the patient has a history of stenting of this vessel. We were unable to obtain the records, but the stent was difficult to visualize. I thought I would use eye this to better understand the anatomy. The eye this run demonstrated a tight lesion just before the bifurcation of the intermediate branch. There were a few stent struts seen distally but none in the area of the critical lesion. The lesion was then stented with a 3.0 x 18 mm stent.  The stent was postdilated with a 3.25 mm noncompliant balloon.  Following PCI, there was 0% residual stenosis and TIMI-3 flow. Final angiography confirmed an excellent result. Completion  IVUS was performed as well. The patient tolerated the procedure well. There were no immediate procedural complications. A TR band was used for radial hemostasis. The patient was transferred to the post catheterization recovery area for further monitoring.  Lesion Data: Vessel:  Ramus intermediate Percent stenosis (pre): 95 TIMI-flow (pre):  3 Stent:  3.0 x 18 mm drug-eluting Percent stenosis (post): 0 TIMI-flow (post): 3  Conclusions: Successful PCI of the ramus intermedius, guided by intravascular ultrasound.  Recommendations: Aspirin and Plavix for at least 12 months   Sherren Mocha 12/17/2011, 5:15 PM

## 2011-12-17 NOTE — Progress Notes (Signed)
Pt interviewed and examined. I have reviewed diagnostic cath films and discussed risks, indications for PCI. Will plan on IVUS-guided intervention of the ramus intermedius. He has been loaded with plavix. If procedure is uncomplicated, we will discharge him in the am to follow-up with Dr Clayborn Bigness in [place].  Sherren Mocha 12/17/2011 4:10 PM

## 2011-12-17 NOTE — H&P (View-Only) (Signed)
Pt interviewed and examined. I have reviewed diagnostic cath films and discussed risks, indications for PCI. Will plan on IVUS-guided intervention of the ramus intermedius. He has been loaded with plavix. If procedure is uncomplicated, we will discharge him in the am to follow-up with Dr Clayborn Bigness in [place].  Sherren Mocha 12/17/2011 4:10 PM

## 2011-12-17 NOTE — Progress Notes (Signed)
[facility] PROGRESS NOTE  Peter [name] [id number] DOB: 01-Jul-1940 DOA: 12/14/2011 PCP: Pcp Not In System  Assessment/Plan: Principal Problem:  *SOB (shortness of breath) Active Problems:  CAD (coronary artery disease)  HTN (hypertension)  Hyperlipidemia  GERD (gastroesophageal reflux disease)  BPH (benign prostatic hyperplasia)  Aneurysm of aortic arch  Hypokalemia  1. Precordial pain: Question non-STEMI.  Cath yesterday noted significant stenosis in ramus intermedius. For PCI today and hopefully discharge tomorrow 2. ?Aneurysm of the aortic arch patient seen by cardiothoracic surgery who feels there is plaque but no aneurysm. Nor hematoma or dissection. Likely this is radiologic interpretation. 3. History of AAA-followed by vascular surgery in [place] currently at 4.8 cm. 4. Hypertension: Stable. 5. BPH: Stable.  Code Status: Full code Family Communication: Plan discussed with patient Disposition Plan: Discharge to home when cleared likely tomorrow   Brief narrative: 71 year old white male past history of CAD with stent x3, hypertension and BPH who presented to [facility] with complaints of shortness of breath and found to have mildly elevated troponins (0.08-0.11) as well as a CT which noted abnormal appearance of the left lateral 2 inferolateral aortic arch concerning for ulceration versus contained hemorrhage. Patient was transferred to [facility] for evaluation by cardiothoracic surgery.  Consultants:  Bartle-cardiothoracic surgery  Wall-cardiology  Procedures:  Cardiac catheterization-12/16/11: Noting 99 % stenosis in the RI  Antibiotics:  None  HPI/Subjective: Patient with no complaints, ready for PCI, no chest pain or SOB  Objective: Filed Vitals:   12/16/11 1424 12/16/11 1655 12/16/11 2017 12/17/11 0520  BP: 138/77  142/73 154/85  Pulse: 64 71 66 84  Temp: 98.6 F (37 C)  98 F (36.7 C) 98.4 F (36.9 C)  TempSrc: Oral   Oral    Resp: 18   19  Height:      Weight:      SpO2: 97%  98% 95%    Intake/Output Summary (Last 24 hours) at 12/17/11 1352 Last data filed at 12/17/11 0700  Gross per 24 hour  Intake  91.47 ml  Output      1 ml  Net  90.47 ml   Filed Weights   12/14/11 1100  Weight: 106.867 kg (235 lb 9.6 oz)    Exam:   General:  Alert and oriented x3, no acute distress, looks younger than stated age  Cardiovascular: Regular rate and rhythm, S1-S2  Respiratory:  Clear to auscultation bilaterally  Abdomen: Soft, nontender, nondistended, positive bowel sounds  Extremities: No clubbing or cyanosis or edema Unchanged  Data Reviewed: Basic Metabolic Panel:  Lab [id number] 12/16/11 0545 12/15/11 1311 12/15/11 0201 12/15/11 0200  NA 138 143 -- 138 --  K 2.9* 3.4* 3.2* 2.8* --  CL 100 104 -- 99 --  CO2 30 31 -- 31 --  GLUCOSE 145* 125* -- 103* --  BUN 12 10 -- 12 --  CREATININE 0.88 0.97 -- 1.02 --  CALCIUM 9.2 9.0 -- 8.8 --  MG -- -- -- -- 2.0  PHOS -- -- -- -- --   CBC:  Lab 12/17/11 0905 12/16/11 0545 12/15/11 0201  WBC 7.2 8.0 9.1  NEUTROABS -- -- --  HGB 13.7 12.9* 12.2*  HCT 40.8 39.0 37.4*  MCV 84.6 84.6 85.4  PLT 206 189 193   Cardiac Enzymes:  Lab 12/15/11 0201 12/14/11 1947 12/14/11 1518  CKTOTAL -- -- --  CKMB -- -- --  CKMBINDEX -- -- --  TROPONINI <0.30 <0.30 <0.30     Studies: No  results found.  Scheduled Meds:    . amLODipine  10 mg Oral Daily  . aspirin  324 mg Oral Pre-Cath  . aspirin  324 mg Oral Pre-Cath  . clopidogrel  75 mg Oral Q breakfast  . heparin      . heparin      . irbesartan  150 mg Oral Daily   And  . hydrochlorothiazide  12.5 mg Oral Daily  . lidocaine      . metoprolol tartrate  25 mg Oral BID  . midazolam      . niacin  1,000 mg Oral QHS  . nitroGLYCERIN      . pantoprazole  40 mg Oral Q1200  . potassium chloride  40 mEq Oral Once  . potassium chloride  40 mEq Oral Once  . sodium chloride  3 mL Intravenous Q12H  .  sodium chloride  3 mL Intravenous Q12H  . sodium chloride  3 mL Intravenous Q12H  . Tamsulosin HCl  0.4 mg Oral QPC breakfast  . verapamil      . DISCONTD: aspirin  324 mg Oral Pre-Cath  . DISCONTD: clopidogrel  300 mg Oral Once  . DISCONTD: sodium chloride  3 mL Intravenous Q12H  . DISCONTD: sodium chloride  3 mL Intravenous Q12H   Continuous Infusions:    . sodium chloride 20 mL/hr at 12/16/11 1125  . sodium chloride 75 mL/hr at 12/17/11 0029  . sodium chloride    . heparin 1,700 Units/hr (12/17/11 1100)  . DISCONTD: sodium chloride       Time spent: 20 minutes    [facility] Pager [phone]. If 8PM-8AM, please contact night-coverage at [email or web address], password [facility] 12/17/2011, 1:52 PM  LOS: 3 days

## 2011-12-17 NOTE — Progress Notes (Signed)
ANTICOAGULATION CONSULT NOTE - Follow Up Consult  Pharmacy Consult for Heparin Indication: chest pain/ACS  No Known Allergies  Patient Measurements: Height: 5\' 11"  (180.3 cm) Weight: 235 lb 9.6 oz (106.867 kg) IBW/kg (Calculated) : 75.3    Vital Signs: Temp: 98.4 F (36.9 C) (09/05 0520) Temp src: Oral (09/05 0520) BP: 154/85 mmHg (09/05 0520) Pulse Rate: 84  (09/05 0520)  Labs:  Basename 12/16/11 0950 12/16/11 0545 12/15/11 0201 12/14/11 1947 12/14/11 1518  HGB -- 12.9* 12.2* -- --  HCT -- 39.0 37.4* -- --  PLT -- 189 193 -- --  APTT -- -- -- -- --  LABPROT 14.2 -- -- -- --  INR 1.08 -- -- -- --  HEPARINUNFRC -- -- -- -- --  CREATININE -- 0.97 1.02 -- --  CKTOTAL -- -- -- -- --  CKMB -- -- -- -- --  TROPONINI -- -- <0.30 <0.30 <0.30    Estimated Creatinine Clearance: 86.8 ml/min (by C-G formula based on Cr of 0.97).  Assessment: Patient is a 71 y.o M on heparin for ACS s/p cath 8/4 with plan for PCI today.  Heparin level now back undetectable (<0.1).  No issues with IV line.  Goal of Therapy:  Heparin level 0.3-0.7 units/ml Monitor platelets by anticoagulation protocol: Yes   Plan:  1) increase heparin drip to 1700 units/hr 2) f/u with patient after PCI   Bonifacio Pruden P 12/17/2011,8:18 AM

## 2011-12-17 NOTE — Interval H&P Note (Signed)
History and Physical Interval Note:  12/17/2011 4:21 PM  Peter [name]  has presented today for surgery, with the diagnosis of CAD  The various methods of treatment have been discussed with the patient and family. After consideration of risks, benefits and other options for treatment, the patient has consented to  Procedure(s) (LRB) with comments: PERCUTANEOUS CORONARY STENT INTERVENTION (PCI-S) (N/A) as a surgical intervention .  The patient's history has been reviewed, patient examined, no change in status, stable for surgery.  I have reviewed the patient's chart and labs.  Questions were answered to the patient's satisfaction.     Sherren Mocha

## 2011-12-17 NOTE — Progress Notes (Signed)
Pt transferred to acth lab for planned PCI.

## 2011-12-18 LAB — CBC
HCT: 40.2 % (ref 39.0–52.0)
Hemoglobin: 13.5 g/dL (ref 13.0–17.0)
MCHC: 33.6 g/dL (ref 30.0–36.0)
MCV: 84.6 fL (ref 78.0–100.0)
RDW: 13.1 % (ref 11.5–15.5)
WBC: 7.8 10*3/uL (ref 4.0–10.5)

## 2011-12-18 LAB — BASIC METABOLIC PANEL
BUN: 11 mg/dL (ref 6–23)
Chloride: 101 mEq/L (ref 96–112)
Creatinine, Ser: 0.85 mg/dL (ref 0.50–1.35)
GFR calc Af Amer: >90 mL/min (ref >90)
Glucose, Bld: 114 mg/dL — ABNORMAL HIGH (ref 70–99)
Potassium: 3.4 mEq/L — ABNORMAL LOW (ref 3.5–5.1)

## 2011-12-18 LAB — POCT ACTIVATED CLOTTING TIME: Activated Clotting Time: 374 seconds

## 2011-12-18 MED ORDER — ASPIRIN 325 MG PO TABS
325.0000 mg | ORAL_TABLET | Freq: Every day | ORAL | Status: DC
  Administered 2011-12-18: 325 mg via ORAL
  Filled 2011-12-18: qty 1

## 2011-12-18 MED ORDER — ATORVASTATIN CALCIUM 40 MG PO TABS: 40.0000 mg | ORAL_TABLET | Freq: Every day | ORAL | Status: DC

## 2011-12-18 MED ORDER — ATORVASTATIN CALCIUM 40 MG PO TABS
40.0000 mg | ORAL_TABLET | Freq: Every day | ORAL | Status: DC
  Filled 2011-12-18: qty 1

## 2011-12-18 MED FILL — Dextrose Inj 5%: INTRAVENOUS | Qty: 50 | Status: AC

## 2011-12-18 NOTE — Progress Notes (Signed)
Went over information in the AVS with pt and family member. Pt denies questions at this time. Will d/c home with family member.

## 2011-12-18 NOTE — Plan of Care (Signed)
Problem: Phase I Progression Outcomes Goal: Vascular site scale level 0 - I Vascular Site Scale Level 0: No bruising/bleeding/hematoma Level I (Mild): Bruising/Ecchymosis, minimal bleeding/ooozing, palpable hematoma < 3 cm Level II (Moderate): Bleeding not affecting hemodynamic parameters, pseudoaneurysm, palpable hematoma > 3 cm   Post Radial band deflation- slight bruising noted- Level 1  Tegaderm & 2x2 applied, Will continue to monitor

## 2011-12-18 NOTE — Progress Notes (Signed)
CARDIAC REHAB PHASE I   PRE:  Rate/Rhythm: 66SR  BP:  Supine:   Sitting: 134/61  Standing:    SaO2: 93%RA   MODE:  Ambulation: 540 ft   POST:  Rate/Rhythem: 88SR  BP:  Supine:   Sitting: 136/68  Standing:    SaO2:  1022-1112 Pt walked 540 ft on RA with steady gait. Tolerated well. Denied CP. Education completed with pt. Discussed smoking cessation and handouts given. Declined CRP 2 as he has attended twice. Encouraged pt to walk for ex.  Jeani Sow

## 2011-12-18 NOTE — Discharge Summary (Signed)
Physician Discharge Summary  Peter [name] [id number] DOB: [birth year] DOA: 12/14/2011  PCP: Tracie Harrier, MD  Admit date: 12/14/2011 Discharge date: 12/18/2011  Recommendations for Outpatient Follow-up:  1. Patient will followup with primary care physician and his cardiologist the next few weeks  Discharge Diagnoses:  Principal Problem:  *SOB (shortness of breath) Active Problems:  CAD (coronary artery disease)  HTN (hypertension)  Hyperlipidemia  GERD (gastroesophageal reflux disease)  BPH (benign prostatic hyperplasia)  Aneurysm of aortic arch  Hypokalemia  Acute myocardial infarction, subendocardial infarction, initial episode of care   Discharge Condition: Stable being discharged home  Diet recommendation: Heart healthy  Filed Weights   12/14/11 1100  Weight: 106.867 kg (235 lb 9.6 oz)    History of present illness:  71 year old white male past history of CAD with stent x3, hypertension and BPH who presented to [facility] with complaints of shortness of breath and found to have mildly elevated troponins (0.08-0.11) as well as a CT which noted abnormal appearance of the left lateral 2 inferolateral aortic arch concerning for ulceration versus contained hemorrhage. Patient was transferred to [facility] for evaluation by cardiothoracic surgery.   Hospital Course:  1. Precordial pain: Question non-STEMI. Patient had minimal elevation in troponins. He underwent a cardiac catheterization done on 9/4 which noted 99% stenosis of the R.I.  he was then taken for PCI on 9/5 which she tolerated without incident. Post procedure, it was recommended that he change from niacin to Lipitor. He will followup with his regular cardiologist in [place] in the next one to 2 weeks. 2. ?Aneurysm of the aortic arch patient seen by cardiothoracic surgery who feels there is plaque but no aneurysm. Nor hematoma or dissection. Likely this is radiologic interpretation. 3. History of  AAA-followed by vascular surgery in [place] currently at 4.8 cm. 4. Hypertension: Stable. 5. BPH: Stable   Procedures:  Cardiac cath done on 12/17/11 noting 99% stenosis of the RI  PCI done 96/6/13 of the R.I  Consultations:  Sherren Mocha, [facility]  Discharge Exam: Filed Vitals:   12/18/11 0402 12/18/11 0500 12/18/11 0600 12/18/11 0745  BP:    141/71  Pulse:  59 60 68  Temp: 97.3 F (36.3 C)   97.5 F (36.4 C)  TempSrc: Oral   Oral  Resp:  18 16 19   Height:      Weight:      SpO2:  93% 94% 96%    General: Alert and oriented x3, no acute distress Cardiovascular: Regular rate and rhythm, S1-S2 Respiratory: Clear to auscultation bilaterally  Discharge Instructions  Discharge Orders    Future Orders Please Complete By Expires   Diet - low sodium heart healthy      Increase activity slowly        Medication List  As of 12/18/2011  5:36 PM   STOP taking these medications         irbesartan-hydrochlorothiazide 150-12.5 MG per tablet      niacin 500 MG CR tablet         TAKE these medications         amLODipine-benazepril 10-20 MG per capsule   Commonly known as: LOTREL   Take 1 capsule by mouth daily.      aspirin 325 MG tablet   Take 325 mg by mouth as needed. Patient takes if there is any pain or burning in the chest.      atorvastatin 40 MG tablet   Commonly known as: LIPITOR   Take 1  tablet (40 mg total) by mouth daily at 6 PM.      ICAPS PO   Take 1 capsule by mouth daily.      omeprazole 10 MG capsule   Commonly known as: PRILOSEC   Take 10 mg by mouth daily.      potassium chloride SA 20 MEQ tablet   Commonly known as: K-DUR,KLOR-CON   Take 40 mEq by mouth 2 (two) times daily.      Tamsulosin HCl 0.4 MG Caps   Commonly known as: FLOMAX   Take 0.4 mg by mouth daily after breakfast.           Follow-up Information    Follow up with [facility], MD. Schedule an appointment as soon as possible for a visit in 1 month. (As  needed)    Contact information:   [place] [phone]       Follow up with Yolonda Kida., MD. Schedule an appointment as soon as possible for a visit in 2 weeks.   Contact information:   [place] [phone]           The results of significant diagnostics from this hospitalization (including imaging, microbiology, ancillary and laboratory) are listed below for reference.    Significant Diagnostic Studies: No results found.  Microbiology: Recent Results (from the past 240 hour(s))  MRSA PCR SCREENING     Status: Normal   Collection Time   12/17/11  5:54 PM      Component Value Range Status Comment   MRSA by PCR NEGATIVE  NEGATIVE Final      Labs: Basic Metabolic Panel:  Lab [id number] 12/17/11 1528 12/17/11 0905 12/16/11 0545 12/15/11 1311 12/15/11 0201 12/15/11 0200  NA 138 140 138 143 -- 138 --  K 3.4* 3.3* 2.9* 3.4* 3.2* -- --  CL 101 102 100 104 -- 99 --  CO2 28 30 30 31  -- 31 --  GLUCOSE 114* 99 145* 125* -- 103* --  BUN 11 11 12 10  -- 12 --  CREATININE 0.85 0.96 0.88 0.97 -- 1.02 --  CALCIUM 9.0 9.1 9.2 9.0 -- 8.8 --  MG -- -- -- -- -- -- 2.0  PHOS -- -- -- -- -- -- --   CBC:  Lab 12/18/11 0555 12/17/11 0905 12/16/11 0545 12/15/11 0201  WBC 7.8 7.2 8.0 9.1  NEUTROABS -- -- -- --  HGB 13.5 13.7 12.9* 12.2*  HCT 40.2 40.8 39.0 37.4*  MCV 84.6 84.6 84.6 85.4  PLT 202 206 189 193   Cardiac Enzymes:  Lab 12/15/11 0201 12/14/11 1947 12/14/11 1518  CKTOTAL -- -- --  CKMB -- -- --  CKMBINDEX -- -- --  TROPONINI <0.30 <0.30 <0.30    Time coordinating discharge: 30 minutes  Signed:  Annita Brod  [facility] 12/18/2011, 5:36 PM

## 2011-12-18 NOTE — Progress Notes (Signed)
    Subjective:  Feels great. No CP or dyspnea.  Objective:  Vital Signs in the last 24 hours: Temp:  [97.3 F (36.3 C)-98.8 F (37.1 C)] 97.3 F (36.3 C) (09/06 0402) Pulse Rate:  [59-80] 60  (09/06 0600) Resp:  [16-23] 16  (09/06 0600) BP: (128-163)/(57-106) 154/75 mmHg (09/06 0400) SpO2:  [91 %-96 %] 94 % (09/06 0600)  Intake/Output from previous day: 09/05 0701 - 09/06 0700 In: 1130 [P.O.:360; I.V.:770] Out: 1800 [Urine:1800]  Physical Exam: Pt is alert and oriented, NAD HEENT: normal Neck: JVP - normal, carotids 2+= without bruits Lungs: CTA bilaterally CV: RRR without murmur or gallop Abd: soft, NT, Positive BS, no hepatomegaly Ext: no C/C/E, distal pulses intact and equal. Right radial site clear. Skin: warm/dry no rash   Lab Results:  Basename 12/18/11 0555 12/17/11 0905  WBC 7.8 7.2  HGB 13.5 13.7  PLT 202 206    Basename 12/17/11 1528 12/17/11 0905  NA 140 138  K 3.3* 2.9*  CL 102 100  CO2 30 30  GLUCOSE 99 145*  BUN 11 12  CREATININE 0.96 0.88   No results found for this basename: TROPONINI:2,CK,MB:2 in the last 72 hours  Tele: Sinus rhythm, personally reviewed. Single PVC's.  Assessment/Plan:  NSTEMI - s/p PCI yesterday. ASA/plavix uninterrupted x 12 months. Tobacco cessation counseling done. Pt on beta-blocker/ARB/statin.  Tobacco - cessation advised.   HTN - on beta-blocker, ARB, Ca blocker, HCTZ. Outpatient followup.  Hyperlipidemia - on Niacin and lipids are at goal. With CAD, I would change to lipitor 40 mg.  Dispo - home today. Follow-up Dr Clayborn Bigness as an outpatient.  Sherren Mocha, M.D. 12/18/2011, 7:32 AM

## 2011-12-18 NOTE — Plan of Care (Signed)
Problem: Phase II Progression Outcomes Goal: Progress activity as tolerated unless otherwise ordered Outcome: Completed/Met Date Met:  12/18/11 Up walking in hallway

## 2012-01-12 ENCOUNTER — Other Ambulatory Visit (HOSPITAL_COMMUNITY): Payer: Self-pay | Admitting: Internal Medicine

## 2012-02-23 ENCOUNTER — Ambulatory Visit: Payer: Self-pay | Admitting: Vascular Surgery

## 2012-08-11 ENCOUNTER — Ambulatory Visit: Payer: Self-pay | Admitting: Ophthalmology

## 2012-08-11 DIAGNOSIS — I10 Essential (primary) hypertension: Secondary | ICD-10-CM

## 2012-08-11 LAB — POTASSIUM: Potassium: 4.1 mmol/L (ref 3.5–5.1)

## 2012-08-22 ENCOUNTER — Ambulatory Visit: Payer: Self-pay | Admitting: Ophthalmology

## 2012-11-06 DIAGNOSIS — N401 Enlarged prostate with lower urinary tract symptoms: Secondary | ICD-10-CM | POA: Insufficient documentation

## 2013-04-10 ENCOUNTER — Ambulatory Visit: Payer: Self-pay | Admitting: Vascular Surgery

## 2013-07-22 DIAGNOSIS — E669 Obesity, unspecified: Secondary | ICD-10-CM | POA: Insufficient documentation

## 2013-07-22 DIAGNOSIS — Z8679 Personal history of other diseases of the circulatory system: Secondary | ICD-10-CM | POA: Insufficient documentation

## 2013-07-22 DIAGNOSIS — J069 Acute upper respiratory infection, unspecified: Secondary | ICD-10-CM | POA: Insufficient documentation

## 2013-07-22 DIAGNOSIS — M1990 Unspecified osteoarthritis, unspecified site: Secondary | ICD-10-CM | POA: Insufficient documentation

## 2013-08-03 ENCOUNTER — Inpatient Hospital Stay: Payer: Self-pay | Admitting: Internal Medicine

## 2013-08-03 LAB — BASIC METABOLIC PANEL
Anion Gap: 5 — ABNORMAL LOW (ref 7–16)
BUN: 17 mg/dL (ref 7–18)
CALCIUM: 8.8 mg/dL (ref 8.5–10.1)
Chloride: 103 mmol/L (ref 98–107)
Co2: 32 mmol/L (ref 21–32)
Creatinine: 1.37 mg/dL — ABNORMAL HIGH (ref 0.60–1.30)
EGFR (African American): 59 — ABNORMAL LOW
GFR CALC NON AF AMER: 51 — AB
Glucose: 114 mg/dL — ABNORMAL HIGH (ref 65–99)
OSMOLALITY: 282 (ref 275–301)
Potassium: 3.3 mmol/L — ABNORMAL LOW (ref 3.5–5.1)
SODIUM: 140 mmol/L (ref 136–145)

## 2013-08-03 LAB — APTT: ACTIVATED PTT: 33.2 s (ref 23.6–35.9)

## 2013-08-03 LAB — CBC
HCT: 43.2 % (ref 40.0–52.0)
HGB: 14.4 g/dL (ref 13.0–18.0)
MCH: 28.1 pg (ref 26.0–34.0)
MCHC: 33.3 g/dL (ref 32.0–36.0)
MCV: 85 fL (ref 80–100)
PLATELETS: 204 10*3/uL (ref 150–440)
RBC: 5.11 10*6/uL (ref 4.40–5.90)
RDW: 14.3 % (ref 11.5–14.5)
WBC: 9.2 10*3/uL (ref 3.8–10.6)

## 2013-08-03 LAB — CK TOTAL AND CKMB (NOT AT ARMC)
CK, Total: 64 U/L
CK-MB: 1.5 ng/mL (ref 0.5–3.6)

## 2013-08-03 LAB — PROTIME-INR
INR: 1
Prothrombin Time: 12.6 secs (ref 11.5–14.7)

## 2013-08-03 LAB — PRO B NATRIURETIC PEPTIDE: B-Type Natriuretic Peptide: 201 pg/mL — ABNORMAL HIGH (ref 0–125)

## 2013-08-03 LAB — TROPONIN I
TROPONIN-I: 0.17 ng/mL — AB
Troponin-I: 0.15 ng/mL — ABNORMAL HIGH

## 2013-08-04 LAB — CBC WITH DIFFERENTIAL/PLATELET
BASOS PCT: 0.5 %
Basophil #: 0 10*3/uL (ref 0.0–0.1)
Eosinophil #: 0.2 10*3/uL (ref 0.0–0.7)
Eosinophil %: 2.9 %
HCT: 38.1 % — AB (ref 40.0–52.0)
HGB: 12.7 g/dL — ABNORMAL LOW (ref 13.0–18.0)
LYMPHS ABS: 2.9 10*3/uL (ref 1.0–3.6)
LYMPHS PCT: 33.6 %
MCH: 28.2 pg (ref 26.0–34.0)
MCHC: 33.4 g/dL (ref 32.0–36.0)
MCV: 84 fL (ref 80–100)
Monocyte #: 0.9 x10 3/mm (ref 0.2–1.0)
Monocyte %: 10.5 %
NEUTROS ABS: 4.5 10*3/uL (ref 1.4–6.5)
Neutrophil %: 52.5 %
Platelet: 174 10*3/uL (ref 150–440)
RBC: 4.52 10*6/uL (ref 4.40–5.90)
RDW: 14 % (ref 11.5–14.5)
WBC: 8.6 10*3/uL (ref 3.8–10.6)

## 2013-08-04 LAB — CK TOTAL AND CKMB (NOT AT ARMC)
CK, TOTAL: 59 U/L
CK-MB: 1.1 ng/mL (ref 0.5–3.6)

## 2013-08-04 LAB — BASIC METABOLIC PANEL
Anion Gap: 5 — ABNORMAL LOW (ref 7–16)
BUN: 18 mg/dL (ref 7–18)
CALCIUM: 8.7 mg/dL (ref 8.5–10.1)
CHLORIDE: 103 mmol/L (ref 98–107)
CREATININE: 1.28 mg/dL (ref 0.60–1.30)
Co2: 31 mmol/L (ref 21–32)
GFR CALC NON AF AMER: 55 — AB
Glucose: 101 mg/dL — ABNORMAL HIGH (ref 65–99)
Osmolality: 280 (ref 275–301)
Potassium: 3.1 mmol/L — ABNORMAL LOW (ref 3.5–5.1)
Sodium: 139 mmol/L (ref 136–145)

## 2013-08-04 LAB — APTT: ACTIVATED PTT: 45 s — AB (ref 23.6–35.9)

## 2013-08-04 LAB — TROPONIN I: TROPONIN-I: 0.14 ng/mL — AB

## 2013-08-05 LAB — BASIC METABOLIC PANEL
ANION GAP: 4 — AB (ref 7–16)
BUN: 20 mg/dL — ABNORMAL HIGH (ref 7–18)
CHLORIDE: 104 mmol/L (ref 98–107)
CREATININE: 1.21 mg/dL (ref 0.60–1.30)
Calcium, Total: 8.7 mg/dL (ref 8.5–10.1)
Co2: 31 mmol/L (ref 21–32)
EGFR (Non-African Amer.): 59 — ABNORMAL LOW
GLUCOSE: 118 mg/dL — AB (ref 65–99)
OSMOLALITY: 281 (ref 275–301)
Potassium: 3.1 mmol/L — ABNORMAL LOW (ref 3.5–5.1)
Sodium: 139 mmol/L (ref 136–145)

## 2013-08-05 LAB — CBC WITH DIFFERENTIAL/PLATELET
BASOS ABS: 0 10*3/uL (ref 0.0–0.1)
BASOS PCT: 0.3 %
Eosinophil #: 0.2 10*3/uL (ref 0.0–0.7)
Eosinophil %: 2.5 %
HCT: 38.9 % — ABNORMAL LOW (ref 40.0–52.0)
HGB: 13 g/dL (ref 13.0–18.0)
Lymphocyte #: 2.4 10*3/uL (ref 1.0–3.6)
Lymphocyte %: 28.2 %
MCH: 28 pg (ref 26.0–34.0)
MCHC: 33.3 g/dL (ref 32.0–36.0)
MCV: 84 fL (ref 80–100)
Monocyte #: 1 x10 3/mm (ref 0.2–1.0)
Monocyte %: 11.9 %
Neutrophil #: 4.8 10*3/uL (ref 1.4–6.5)
Neutrophil %: 57.1 %
PLATELETS: 180 10*3/uL (ref 150–440)
RBC: 4.63 10*6/uL (ref 4.40–5.90)
RDW: 14 % (ref 11.5–14.5)
WBC: 8.5 10*3/uL (ref 3.8–10.6)

## 2013-08-23 DIAGNOSIS — F17200 Nicotine dependence, unspecified, uncomplicated: Secondary | ICD-10-CM | POA: Insufficient documentation

## 2013-12-09 LAB — COMPREHENSIVE METABOLIC PANEL
ANION GAP: 6 — AB (ref 7–16)
Albumin: 3 g/dL — ABNORMAL LOW (ref 3.4–5.0)
Alkaline Phosphatase: 172 U/L — ABNORMAL HIGH
BUN: 16 mg/dL (ref 7–18)
Bilirubin,Total: 0.3 mg/dL (ref 0.2–1.0)
CALCIUM: 8.1 mg/dL — AB (ref 8.5–10.1)
CHLORIDE: 107 mmol/L (ref 98–107)
CO2: 29 mmol/L (ref 21–32)
Creatinine: 1.53 mg/dL — ABNORMAL HIGH (ref 0.60–1.30)
EGFR (Non-African Amer.): 44 — ABNORMAL LOW
GFR CALC AF AMER: 52 — AB
Glucose: 100 mg/dL — ABNORMAL HIGH (ref 65–99)
Osmolality: 284 (ref 275–301)
Potassium: 3.3 mmol/L — ABNORMAL LOW (ref 3.5–5.1)
SGOT(AST): 16 U/L (ref 15–37)
SGPT (ALT): 14 U/L
Sodium: 142 mmol/L (ref 136–145)
TOTAL PROTEIN: 6.6 g/dL (ref 6.4–8.2)

## 2013-12-09 LAB — CBC
HCT: 35.9 % — ABNORMAL LOW (ref 40.0–52.0)
HGB: 11.6 g/dL — ABNORMAL LOW (ref 13.0–18.0)
MCH: 26.2 pg (ref 26.0–34.0)
MCHC: 32.4 g/dL (ref 32.0–36.0)
MCV: 81 fL (ref 80–100)
Platelet: 222 10*3/uL (ref 150–440)
RBC: 4.45 10*6/uL (ref 4.40–5.90)
RDW: 16 % — AB (ref 11.5–14.5)
WBC: 8.7 10*3/uL (ref 3.8–10.6)

## 2013-12-09 LAB — CK TOTAL AND CKMB (NOT AT ARMC)
CK, Total: 34 U/L — ABNORMAL LOW
CK, Total: 32 U/L — ABNORMAL LOW
CK, Total: 34 U/L — ABNORMAL LOW
CK-MB: 1 ng/mL (ref 0.5–3.6)
CK-MB: 0.7 ng/mL (ref 0.5–3.6)
CK-MB: 0.9 ng/mL (ref 0.5–3.6)

## 2013-12-09 LAB — APTT: Activated PTT: 32.2 secs (ref 23.6–35.9)

## 2013-12-09 LAB — PROTIME-INR
INR: 1
Prothrombin Time: 13.2 secs (ref 11.5–14.7)

## 2013-12-09 LAB — TROPONIN I
TROPONIN-I: 0.21 ng/mL — AB
TROPONIN-I: 0.21 ng/mL — AB
TROPONIN-I: 0.21 ng/mL — AB

## 2013-12-10 ENCOUNTER — Inpatient Hospital Stay: Payer: Self-pay | Admitting: Internal Medicine

## 2013-12-10 LAB — BASIC METABOLIC PANEL
ANION GAP: 6 — AB (ref 7–16)
BUN: 15 mg/dL (ref 7–18)
CO2: 30 mmol/L (ref 21–32)
Calcium, Total: 8.1 mg/dL — ABNORMAL LOW (ref 8.5–10.1)
Chloride: 106 mmol/L (ref 98–107)
Creatinine: 1.36 mg/dL — ABNORMAL HIGH (ref 0.60–1.30)
EGFR (African American): 59 — ABNORMAL LOW
GFR CALC NON AF AMER: 51 — AB
Glucose: 94 mg/dL (ref 65–99)
OSMOLALITY: 284 (ref 275–301)
Potassium: 3 mmol/L — ABNORMAL LOW (ref 3.5–5.1)
Sodium: 142 mmol/L (ref 136–145)

## 2013-12-10 LAB — CBC WITH DIFFERENTIAL/PLATELET
Basophil #: 0.1 10*3/uL (ref 0.0–0.1)
Basophil %: 0.6 %
EOS ABS: 0.3 10*3/uL (ref 0.0–0.7)
EOS PCT: 3.8 %
HCT: 33.8 % — ABNORMAL LOW (ref 40.0–52.0)
HGB: 10.8 g/dL — ABNORMAL LOW (ref 13.0–18.0)
Lymphocyte #: 2.7 10*3/uL (ref 1.0–3.6)
Lymphocyte %: 30.5 %
MCH: 25.6 pg — AB (ref 26.0–34.0)
MCHC: 32 g/dL (ref 32.0–36.0)
MCV: 80 fL (ref 80–100)
MONO ABS: 0.8 x10 3/mm (ref 0.2–1.0)
Monocyte %: 8.5 %
NEUTROS ABS: 5.1 10*3/uL (ref 1.4–6.5)
Neutrophil %: 56.6 %
Platelet: 201 10*3/uL (ref 150–440)
RBC: 4.21 10*6/uL — AB (ref 4.40–5.90)
RDW: 16.2 % — AB (ref 11.5–14.5)
WBC: 9 10*3/uL (ref 3.8–10.6)

## 2013-12-10 LAB — MAGNESIUM: Magnesium: 1.7 mg/dL — ABNORMAL LOW

## 2013-12-11 LAB — BASIC METABOLIC PANEL
ANION GAP: 7 (ref 7–16)
BUN: 20 mg/dL — AB (ref 7–18)
CALCIUM: 8.5 mg/dL (ref 8.5–10.1)
Chloride: 106 mmol/L (ref 98–107)
Co2: 29 mmol/L (ref 21–32)
Creatinine: 1.31 mg/dL — ABNORMAL HIGH (ref 0.60–1.30)
EGFR (Non-African Amer.): 54 — ABNORMAL LOW
GLUCOSE: 108 mg/dL — AB (ref 65–99)
OSMOLALITY: 286 (ref 275–301)
Potassium: 3 mmol/L — ABNORMAL LOW (ref 3.5–5.1)
Sodium: 142 mmol/L (ref 136–145)

## 2013-12-11 LAB — CK TOTAL AND CKMB (NOT AT ARMC)
CK, TOTAL: 23 U/L — AB
CK-MB: 1 ng/mL (ref 0.5–3.6)

## 2013-12-12 LAB — CBC WITH DIFFERENTIAL/PLATELET
BASOS PCT: 0.3 %
Basophil #: 0 10*3/uL (ref 0.0–0.1)
EOS ABS: 0.2 10*3/uL (ref 0.0–0.7)
EOS PCT: 2.4 %
HCT: 31.8 % — AB (ref 40.0–52.0)
HGB: 10.5 g/dL — ABNORMAL LOW (ref 13.0–18.0)
Lymphocyte #: 2.2 10*3/uL (ref 1.0–3.6)
Lymphocyte %: 23.3 %
MCH: 26.3 pg (ref 26.0–34.0)
MCHC: 33.1 g/dL (ref 32.0–36.0)
MCV: 79 fL — AB (ref 80–100)
MONO ABS: 1 x10 3/mm (ref 0.2–1.0)
Monocyte %: 10.3 %
NEUTROS PCT: 63.7 %
Neutrophil #: 5.9 10*3/uL (ref 1.4–6.5)
Platelet: 211 10*3/uL (ref 150–440)
RBC: 4.01 10*6/uL — AB (ref 4.40–5.90)
RDW: 16.4 % — ABNORMAL HIGH (ref 11.5–14.5)
WBC: 9.3 10*3/uL (ref 3.8–10.6)

## 2013-12-12 LAB — BASIC METABOLIC PANEL
Anion Gap: 7 (ref 7–16)
BUN: 20 mg/dL — ABNORMAL HIGH (ref 7–18)
CHLORIDE: 106 mmol/L (ref 98–107)
CREATININE: 1.34 mg/dL — AB (ref 0.60–1.30)
Calcium, Total: 8.1 mg/dL — ABNORMAL LOW (ref 8.5–10.1)
Co2: 29 mmol/L (ref 21–32)
EGFR (Non-African Amer.): 52 — ABNORMAL LOW
GLUCOSE: 104 mg/dL — AB (ref 65–99)
Osmolality: 286 (ref 275–301)
Potassium: 3.2 mmol/L — ABNORMAL LOW (ref 3.5–5.1)
SODIUM: 142 mmol/L (ref 136–145)

## 2014-03-22 ENCOUNTER — Encounter (HOSPITAL_COMMUNITY): Payer: Self-pay | Admitting: Cardiology

## 2014-07-31 NOTE — Discharge Summary (Signed)
PATIENT NAME:  Peter [name], Peter [name] MR#:  [id number] DATE OF BIRTH:  Oct 30, 1940  DATE OF ADMISSION:  12/14/2011 DATE OF DISCHARGE:  12/14/2011  TYPE OF DISCHARGE: The patient is transferred to [place]: Chest pain.   HISTORY OF PRESENT ILLNESS: The patient is a 74 year old male with a history of chronic obstructive pulmonary disease/tobacco abuse and coronary artery disease status post percutaneous transluminal coronary angioplasty with stent placement x3 who presented to the Emergency Room with chest pain worsened by cough. In the Emergency Room, the patient was noted to have a mildly elevated troponin and was admitted for further evaluation.   PAST MEDICAL HISTORY:  1. Atherosclerotic cardiovascular disease status post myocardial infarction.  2. Status post percutaneous transluminal coronary angioplasty with stent placement x3.  3. Benign hypertension.  4. Hyperlipidemia.  5. Chronic obstructive pulmonary disease/tobacco abuse.  6. Chronic anemia.  7. History of abdominal aortic aneurysm. 8. Benign prostatic hypertrophy.  9. Status post ventral hernia repair.   MEDICATIONS ON ADMISSION: Please see admission note.   ALLERGIES: No known drug allergies.   SOCIAL HISTORY, FAMILY HISTORY, AND REVIEW OF SYSTEMS: As per admission note.   PHYSICAL EXAM: The patient was in no acute distress. Vital signs were stable and he was afebrile. HEENT exam was unremarkable. Neck was supple without JVD. Lungs were clear. Cardiac examination revealed a regular rate and rhythm. Normal S1, S2. No significant rubs or murmurs. Abdomen was soft and nontender. No organomegaly or masses were appreciated. Extremities were without edema. Neurologic exam was grossly nonfocal.   HOSPITAL COURSE: The patient was admitted with atypical chest pain with a history of underlying heart disease. His troponin initially was 0.11. A D-dimer was sent and was mildly elevated at 0.64. The patient  underwent CT scan of the chest which revealed a possible ulceration versus dissection of the distal aortic arch. This was read out by the radiologist. Vascular surgery was consulted here at [facility] who recommended the patient be transferred to cardiothoracic surgery at a larger facility. [facility]. They agreed with transfer of the patient for cardiothoracic surgery evaluation. The patient was subsequently transferred on the same day as admission, 12/14/2011, to [facility] for further evaluation of possible distal aortic dissection. Details regarding his stay at [facility] are not known at this time.   ____________________________ Leonie Douglas Doy Hutching, MD jds:rbg D: 12/20/2011 13:38:52 ET T: 12/22/2011 11:43:35 ET JOB#: [id number]  cc: Leonie Douglas. Doy Hutching, MD, <Dictator> Ioanna Colquhoun Lennice Sites MD ELECTRONICALLY SIGNED 12/22/2011 15:08

## 2014-07-31 NOTE — H&P (Signed)
PATIENT NAME:  Peter [name], Peter [name] MR#:  [id number] DATE OF BIRTH:  07/05/1940  DATE OF ADMISSION:  12/14/2011  REFERRING PHYSICIAN: Lurline Hare, MD  FAMILY PHYSICIAN: Tracie Harrier, MD  REASON FOR ADMISSION: Chest pain worrisome for unstable angina.   HISTORY OF PRESENT ILLNESS: The patient is a 74 year old male with a history of coronary artery disease status post myocardial infarction with previous stent placement x3. Last stent was placed approximately 3 to 4 years ago. He also has a history of chronic obstructive pulmonary disease and continuing tobacco abuse. He presents to the Emergency Room with worsening chest pain and shortness of breath associated with cough. The chest pain is certainly worsened by the cough. He has been having worsening dyspnea and fatigue. In the Emergency Room, the patient was noted to have mildly elevated troponin and is now admitted for further evaluation. At rest, the patient is chest pain-free.   PAST MEDICAL HISTORY:  1. Atherosclerotic cardiovascular disease status post myocardial infarction.  2. Status post percutaneous transluminal coronary angioplasty with stent placement x3.  3. Benign hypertension.  4. Hyperlipidemia.  5. Chronic anemia.  6. Abdominal aortic aneurysm.  7. History of ventral hernia repair.  8. Benign prostatic hypertrophy.   MEDICATIONS:  1. Flomax 0.4 mg p.o. daily.  2. Zocor 20 mg p.o. at bedtime.  3. K-Dur 40 mEq p.o. twice a day. 4. Niaspan 500 mg p.o. at bedtime. 5. Prilosec 20 mg p.o. daily.  6. Toprol-XL 50 mg p.o. daily.  7. Avalide 300/12.5 mg one p.o. daily.  8. Norvasc 10 mg p.o. daily.  9. Plavix 75 mg p.o. daily.   ALLERGIES: No known drug allergies.   SOCIAL HISTORY: The patient smokes 1-1/2 packs per day. He denies alcohol abuse.   FAMILY HISTORY: Positive for hypertension, stroke, and coronary artery disease.   REVIEW OF SYSTEMS: CONSTITUTIONAL: No fever or change in weight. EYES: No blurred or double  vision. No glaucoma. ENT: No tinnitus or hearing loss. No nasal discharge or bleeding. No difficulty swallowing. RESPIRATORY: The patient has had cough and wheezing. Denies hemoptysis. No painful respiration. CARDIOVASCULAR: Chest pain as per history of present illness. No orthopnea or palpitations. GI: No nausea, vomiting, or diarrhea. No abdominal pain. No change in bowel habits. GU: No dysuria or hematuria. No incontinence. ENDOCRINE: No polyuria or polydipsia. No heat or cold intolerance. HEMATOLOGIC: The patient denies anemia, easy bruising, or bleeding. LYMPHATIC: No swollen glands. MUSCULOSKELETAL: The patient denies pain in his neck, back, shoulders, knees, or hips. No gout. NEUROLOGIC: No numbness or weakness. Denies migraines, stroke, or seizures. PSYCH: The patient denies anxiety, insomnia, or depression.   PHYSICAL EXAMINATION:   GENERAL: The patient is in no acute distress.   VITAL SIGNS: Vital signs are remarkable for a blood pressure of 139/74 with a heart rate of 78 and a respiratory rate of 20. He is afebrile.   HEENT: Normocephalic, atraumatic. Pupils equally round and reactive to light and accommodation. Extraocular movements are intact. Sclerae are anicteric. Conjunctivae are clear. Oropharynx is clear.   NECK: Supple without jugular venous distention or bruits. No adenopathy or thyromegaly was noted.   LUNGS: Clear to auscultation and percussion without wheezes, rales, or rhonchi. No dullness.   CARDIAC: Regular rate and rhythm with normal S1 and S2. No significant rubs, murmurs, or gallops. PMI is nondisplaced. Chest wall is nontender.   ABDOMEN: Soft and nontender with normoactive bowel sounds. No organomegaly or masses appreciated. No hernias or bruits were noted.   EXTREMITIES:  No clubbing, cyanosis, or edema. Pulses were 2+ bilaterally.   SKIN: Warm and dry without rash or lesions.   NEUROLOGIC: Cranial nerves II through XII grossly intact. Deep tendon reflexes were  symmetric. Motor and sensory exam is nonfocal.   PSYCH: The patient was alert and oriented to person, place, and time. He was cooperative and used good judgment.   LABORATORY, DIAGNOSTIC AND RADIOLOGIC DATA: Troponin was 0.11 with a total CK of 82 and MB of 0.8. White count was 7.3 with a hemoglobin of 13.3. Glucose was 136 with a BUN of 15 and creatinine 1.21 with a sodium of 139 and a potassium of 3.5 with a GFR of 60.   Chest x-ray was essentially unremarkable, except for chronic obstructive pulmonary disease.   EKG revealed sinus rhythm with no acute ischemic changes.   ASSESSMENT:  1. Chest pain worrisome for unstable angina.  2. Elevated troponin.  3. Hyperglycemia.  4. Hyperlipidemia.  5. Benign hypertension.  6. Abdominal aortic aneurysm.  7. Atherosclerotic cardiovascular disease status post previous stent placement.   PLAN: The patient will be admitted to telemetry with heparin and topical nitrates. We will continue his Plavix and beta blocker therapy. We will follow serial cardiac enzymes and obtain an echocardiogram. We will consult cardiology because of his chest pain and elevated troponin. We will also send off a d-dimer because of the atypical nature of his chest pain which is made     worse by coughing. We will consider CT of the chest if his d-dimer is elevated. Follow up routine labs in the morning. Further treatment and evaluation will depend upon the patient's progress.   TOTAL TIME SPENT: 50 minutes.  ____________________________ Leonie Douglas Doy Hutching, MD jds:slb D: 12/14/2011 01:54:13 ET T: 12/14/2011 12:38:53 ET JOB#: [id number]  cc: Leonie Douglas. Doy Hutching, MD, <Dictator> Tracie Harrier, MD Leoni Goodness Lennice Sites MD ELECTRONICALLY SIGNED 12/14/2011 20:19

## 2014-08-03 NOTE — Op Note (Signed)
PATIENT NAME:  Peter [name], Peter [name] MR#:  [id number] DATE OF BIRTH:  [birth year]  DATE OF PROCEDURE:  08/22/2012  PREOPERATIVE DIAGNOSIS: Cataract, right eye.   POSTOPERATIVE DIAGNOSIS: Cataract, right eye.  PROCEDURE PERFORMED: Extracapsular cataract extraction using phacoemulsification with placement of Alcon [id number], 23.0-diopter posterior chamber lens, serial number [id number].   SURGEON: Loura Back. Edvin Albus, M.D.   ANESTHESIA: 4% lidocaine and 0.75% Marcaine, a 50-50 mixture with 10 units/mL of Hylenex added, given as a peribulbar.   ANESTHESIOLOGIST: Dr. Ronelle Nigh.   COMPLICATIONS: None.   ESTIMATED BLOOD LOSS: Less than 1 mL.   DESCRIPTION OF PROCEDURE: PREOPERATIVE DIAGNOSIS:  Cataract, right eye.   POSTOPERATIVE DIAGNOSIS:  Cataract, right eye.  DESCRIPTION OF PROCEDURE:  The patient was brought to the operating room and given a peribulbar block.  The patient was then prepped and draped in the usual fashion.  The vertical rectus muscles were imbricated using 5-0 silk sutures.  These sutures were then clamped to the sterile drapes as bridle sutures.  A limbal peritomy was performed extending two clock hours and hemostasis was obtained with cautery.  A partial thickness scleral groove was made at the surgical limbus and dissected anteriorly in a lamellar dissection using an Alcon crescent knife.  The anterior chamber was entered superonasally with a Superblade and through the lamellar dissection with a 2.6 mm keratome.  DisCoVisc was used to replace the aqueous and a continuous tear capsulorrhexis was carried out.  Hydrodissection and hydrodelineation were carried out with balanced salt and a 27 gauge canula.  The nucleus was rotated to confirm the effectiveness of the hydrodissection.  Phacoemulsification was carried out using a divide-and-conquer technique.  Total ultrasound time was 1 minute and 24 seconds with an average power of 26.4%. CDE 39.32.  Irrigation/aspiration was used to  remove the residual cortex.  DisCoVisc was used to inflate the capsule and the internal incision was enlarged to 3 mm with the crescent knife.  The intraocular lens was folded and inserted into the capsular bag using the AcrySert delivery system.  Irrigation/aspiration was used to remove the residual DisCoVisc.  Cefuroxime 10 mL was injected into the anterior chamber through the paracentesis track to inflate the anterior chamber and induce miosis.  The wound was checked for leaks and none were found. The conjunctiva was closed with cautery and the bridle sutures were removed.  Two drops of 0.3% Vigamox were placed on the eye.   An eye shield was placed on the eye.  The patient was discharged to the recovery room in good condition.   ____________________________ Loura Back Cristopher Ciccarelli, MD sad:aw D: 08/22/2012 14:41:30 ET T: 08/23/2012 06:04:12 ET JOB#: [id number]  cc: Remo Lipps A. Jesicca Dipierro, MD, <Dictator> Martie Lee MD ELECTRONICALLY SIGNED 08/24/2012 11:53

## 2014-08-04 NOTE — Consult Note (Signed)
   Present Illness 74 yo male with history of cad s/p pci x 4 as well as a AAA that is scheduled to be stented at [facility] in 2 weeks.  He presented with chest pain similar to his angina. He has a mild troponin elevation. EKG did not suggest acute injury of ischemia. He is pain free on current meds including asa, plavix and heparin.   Physical Exam:  GEN well developed, well nourished, no acute distress   HEENT PERRL, hearing intact to voice   NECK supple   RESP normal resp effort  clear BS  no use of accessory muscles   CARD Regular rate and rhythm  Normal, S1, S2  No murmur   ABD denies tenderness  normal BS  abdominal Bruits   LYMPH negative neck, negative axillae   EXTR negative cyanosis/clubbing, negative edema   SKIN normal to palpation   NEURO cranial nerves intact, motor/sensory function intact   PSYCH A+O to time, place, person   Review of Systems:  Subjective/Chief Complaint chest pain   General: No Complaints   Skin: No Complaints   ENT: No Complaints   Eyes: No Complaints   Neck: No Complaints   Respiratory: No Complaints   Cardiovascular: Chest pain or discomfort   Gastrointestinal: No Complaints   Genitourinary: No Complaints   Vascular: No Complaints   Musculoskeletal: No Complaints   Neurologic: No Complaints   Hematologic: No Complaints   Endocrine: No Complaints   Psychiatric: No Complaints   Review of Systems: All other systems were reviewed and found to be negative   Medications/Allergies Reviewed Medications/Allergies reviewed   Family & Social History:  Family and Social History:  Family History Coronary Artery Disease  Hypertension   Social History positive  tobacco, negative ETOH, negative Illicit drugs   + Tobacco Current (within 1 year)   Place of Living Home    No Known Allergies:    Impression 74 yo male with history of cad and aaa. Has a xience 3.0 x 28 mm stent in lad placed in 2009. Has stents in rca as well. He  has a aaa that is scheduled to be stented with a fenestrated supra and infra renal abdominal stent. He has ruled in for a mild nstemi. Will need cardiac cath to evaluate anatomy to guide further therapy.   Plan 1. Conitnue current meds 2. Left cardiac cath to evaluate anatomy 3. Further recs after cath   Electronic Signatures: Teodoro Spray (MD)  (Signed 24-Apr-15 07:51)  Authored: General Aspect/Present Illness, History and Physical Exam, Review of System, Family & Social History, Allergies, Impression/Plan   Last Updated: 24-Apr-15 07:51 by Teodoro Spray (MD)

## 2014-08-04 NOTE — Consult Note (Signed)
PATIENT NAME:  Peter [name], Peter [name] MR#:  [id number] DATE OF BIRTH:  [birth year]  DATE OF CONSULTATION:  12/09/2013  REFERRING PHYSICIAN:  Conni Slipper, MD CONSULTING PHYSICIAN:  Corey Skains, MD  REASON FOR CONSULTATION: Known coronary artery disease with hyperlipidemia, hypertension, chronic kidney disease, and abdominal aortic aneurysm status post stenting, having chest discomfort consistent with a minimal subendocardial myocardial infarction.   CHIEF COMPLAINT: "I have chest pain."   HISTORY OF PRESENT ILLNESS: This is a 74 year old male with known coronary disease, status post multiple stents in the past, with no evidence of myocardial infarction. The patient did have hyperlipidemia, hypertension and chronic kidney disease on appropriate medication management, including lipid management. The patient also has had an abdominal aortic aneurysm recently with ultrasound showing stable stented aneurysm with no changes. He has had shortness of breath over the last 2 weeks and chest pain over the last 2 days intermittently with and without physical activity, relieved by rest, increasing in frequency and intensity. The patient now had an issue of chest discomfort with rest, waking him up in the middle of the night, substernal, radiating into his back, associated with shortness of breath. When seen in the Emergency Room, he had an EKG showing normal sinus rhythm with left axis deviation; cannot rule out septal infarct, age undetermined. Troponin was 0.21, consistent with minimal subendocardial myocardial infarction. The patient has had again good blood pressure and cholesterol control, and no other heart failure-type symptoms.   REVIEW OF SYSTEMS: The remainder of the review of systems was negative for vision change, ringing in the ears, hearing loss, cough, congestion, heartburn, nausea, vomiting, diarrhea, bloody stools, stomach pain, extremity pain, leg weakness, cramping of the buttocks, known blood  clots, headaches, blackouts, dizzy spells, nosebleeds, congestion, trouble swallowing, frequent urination, urination at night, muscle weakness, numbness, anxiety or depression, or skin lesions or skin rashes.   PAST MEDICAL HISTORY:  1.  Coronary artery disease status post previous PCI and stent placement into abdominal aortic aneurysm, status post stenting.  2.  Hypertension.  3.  Hyperlipidemia.  4.  Chronic kidney disease.   FAMILY HISTORY: There are multiple family members with coronary artery disease as well as hypertension.   SOCIAL HISTORY: He does continue to smoke tobacco and denies alcohol use.   ALLERGIES: AS LISTED.   MEDICATIONS: As listed.   PHYSICAL EXAMINATION:  VITAL SIGNS: Blood pressure is 122/68 bilaterally. Heart rate 72 upright, reclining, and regular.  GENERAL: He is a well appearing male in no acute distress.  HEAD, EYES, EARS, NOSE, THROAT: No icterus, thyromegaly, ulcers, hemorrhage, or xanthelasma.  CARDIOVASCULAR: Regular rate and rhythm with normal S1 and S2. Inferiorly displaced PMI. No apparent murmur. Carotid upstroke normal without bruit. Jugular venous pressure is normal.  LUNGS: Have diffuse wheezes.  ABDOMEN: Soft, nontender, without hepatosplenomegaly or masses. Abdominal aorta is enlarged with bruit.  EXTREMITIES: Show 2+ bilateral pulses in dorsal, pedal, radial and femoral arteries, without lower extremity edema, cyanosis, clubbing, or ulcers.  NEUROLOGIC: He is oriented to time, place, and person, with normal mood and affect.   ASSESSMENT: A 74 year old male with known coronary disease status post previous stent placement, hypertension, hyperlipidemia, chronic kidney disease, abdominal aortic aneurysm, status post stenting having a subendocardial myocardial infarction, needing further treatment options.   RECOMMENDATIONS:  1.  Heparin for further risk reduction in myocardial infarction, and aspirin as well.  2.  Continue serial ECG and enzymes to  assess for possible myocardial infarction extent.  3.  Echocardiogram  for left ventricular systolic dysfunction and myocardial infarction.  4.  Continue beta blocker and aspirin for further risk reduction of cardiovascular event.  5.  Lipid management with a goal moderate to high intensity cholesterol therapy with atorvastatin.  6.  Follow chronic kidney disease for any concerns, and ACE inhibitor if able.  7.  Proceed to cardiac catheterization to assess coronary anatomy and further treatment thereof as necessary. The patient understands the risks and benefits of cardiac catheterization. These include the possibility of death, stroke, heart attack, infection, bleeding or blood clot. He is at low risk for conscious sedation.     ____________________________ Corey Skains, MD bjk:MT D: 12/09/2013 [id number] ET T: 12/09/2013 07:12:27 ET JOB#: [id number]  cc: Corey Skains, MD, <Dictator> Corey Skains MD ELECTRONICALLY SIGNED 12/11/2013 8:00

## 2014-08-04 NOTE — Discharge Summary (Signed)
PATIENT NAME:  Peter [name], Peter [name] MR#:  [id number] DATE OF BIRTH:  24-Jan-1941  DATE OF ADMISSION:  08/03/2013 DATE OF DISCHARGE:  08/05/2013  DIAGNOSES AT TIME OF DISCHARGE: 1.  Chest pain with no new coronary artery disease on cardiac catheterization, elevated troponin unclear etiology.  2.  Malignant hypertension.  3.  Hypokalemia.  4.  History of abdominal aortic aneurysm.   CHIEF COMPLAINT: Chest pain.   HISTORY OF PRESENT ILLNESS: Peter [name] is a 74 year old male with history of CAD status post 4 stents, was admitted because of chest pain. The patient stated the pain was around 5 out of 10 in severity and it appeared to come and go. He did take 3 aspirins and subsequently developed lightheadedness but denies any nausea, vomiting. The patient's initial troponin was elevated at 0.17, subsequent troponins were 0.15 and 0.14, respectively. The patient underwent a cardiac cath and was also seen in consultation by Dr. Ubaldo Glassing, cardiologist. Cardiac cath revealed insignificant disease in the LAD stent, normal left circumflex and RCA ejection fraction was normal. Dr. Ubaldo Glassing also felt it was stable for him to proceed with this repair of abdominal aortic aneurysm at [facility].   PHYSICAL EXAMINATION: GENERAL: The patient was not in distress.  VITAL SIGNS: Temperature was 97.5, heart rate 84. Blood pressure initially was 213/86 with subsequent blood pressure 160/70. He was alert and awake, not in distress.  HEENT: NCAT.  NECK: Supple.  HEART: S1, S2.  LUNGS: Clear.  ABDOMEN: Soft, nontender.  EXTREMITIES: No edema.  NEUROLOGIC: Nonfocal.   HOSPITAL COURSE: During his stay in the hospital, the patient did not have any further episodes of chest pain. His blood pressure also stabilized and in light of cardiac cath showing insignificant disease with normal EF, he was ambulated and subsequently discharged home in stable condition.   DISCHARGE MEDICATIONS:  Amlodipine 10 mg once a day, KCl 20 mEq b.i.d.,  clopidogrel  75 mg once a day, atorvastatin 40 mg once a day, ICaps 1 capsule b.i.d., vitamin B12 one tablet once a day, chondroitin glucosamine 1 tablet once a day, aspirin 81 mg a day, HCTZ 12.5 mg once a day, omeprazole 20 mg once a day, metoprolol titrate 25 mg p.o. b.i.d.  The patient is advised to keep his followup appointment with me, Dr. Ginette Pitman, in 1 to 2 weeks' time and advised to call back or return to ER if he has any worsening chest pain at any time.  TOTAL TIME SPENT ON DISCHARGING THIS PATIENT:  40 minutes.   ____________________________ Tracie Harrier, MD vh:ce D: 08/24/2013 17:40:00 ET T: 08/24/2013 19:31:11 ET JOB#: [id number]  cc: Tracie Harrier, MD, <Dictator> Tracie Harrier MD ELECTRONICALLY SIGNED 08/29/2013 13:23

## 2014-08-04 NOTE — H&P (Signed)
PATIENT NAME:  Peter [name], Peter [name] MR#:  [id number] DATE OF BIRTH:  May 22, 1940  DATE OF ADMISSION:  12/09/2013  REFERRING PHYSICIAN:  Dr. Conni Slipper.     CARDIOLOGIST:  Dr. Clayborn Bigness.   CHIEF COMPLAINT: Chest pain.   HISTORY OF PRESENT ILLNESS: This is a 74 year old male with history of coronary artery disease, status post PCI x 4, with most recent cardiac catheterization done in April of this year which does show multiple vessel disease low level most significant RCA 40%. The patient has well-known history of AAA disease status post stenting at [facility] this May. The patient presents with complaints of chest pain. Reports has been going on for the last two days, midsternal burning-like quality, nonradiating, and it starts on exertion, relieved by rest, but reports tonight, it happened at rest while he was sleeping which woke him up from sleep. He took 2 baby aspirin at home. The patient currently denies any chest pain, he denies any chest pain, shortness of breath, diaphoresis, nausea, dizziness, palpitation, accompanying the chest pain. The patient, upon presentation, had basic work-up. EKG did not show acute changes, but his troponins were elevated at 0.21. By reviewing his old records, the patient has a chronically elevated troponin with baseline around 0.15 to 0.17. The patient denies any chest pain, since he came to the ED.   The patient was given one dose of subcutaneous Lovenox 1 mg/kg out of concern of unstable angina and hospitalist requested to admit the patient for further evaluation of his chest pain.   PAST MEDICAL HISTORY:  1.  Hypertension.  2. Abdominal aortic aneurysm status post stenting at [facility] this May.  3. History of benign prostatic hypertrophy.  4. Coronary artery disease, status post 4 stents.  5. Chronic anemia.   SOCIAL HISTORY: The patient smokes less than 1 pack per day. No alcohol. No illicit drug use.   FAMILY HISTORY: Significant for hypertension and coronary artery  disease in the family.   HOME MEDICATIONS:  1. Norvasc 10 mg. 2. Daily potassium 20 mEq b.i.d.  3. Plavix 75 mg daily. 4. Atorvastatin 40 mg daily.  5. Vitamin B12 once daily.  6. Aspirin 81 mg daily. 7. Hydrochlorothiazide 12.5 mg daily.  8. Omeprazole 20 mg daily.  9. Metoprolol, he cannot recall the dose, but he does know he is on 1 tablet daily.   REVIEW OF SYSTEMS:  CONSTITUTIONAL: Denies any fever, chills, fatigue, weakness.  EYES: Denies blurry vision, double vision, inflammation, glaucoma.  ENT: Denies tinnitus, ear pain, hearing loss, epistaxis.  RESPIRATORY: Denies cough, wheezing, hemoptysis, shortness of breath.  CARDIOVASCULAR: Reports chest pain, denies syncope, palpitation or edema.  GASTROINTESTINAL: Denies nausea, vomiting, diarrhea, hematemesis.  GENITOURINARY: Denies dysuria, hematuria, renal colic.  ENDOCRINE: Denies polyuria, polydipsia, heat or cold intolerance.  HEMATOLOGY: Denies anemia, easy bruising, bleeding diathesis.  INTEGUMENTARY: Denies acne, rash, or skin lesion.  MUSCULOSKELETAL: Denies any swelling, gout, arthritis, cramps.  NEUROLOGIC: Denies CVA, TIA, tremors, vertigo, ataxia.  PSYCHIATRIC: Denies anxiety, insomnia, or depression.   ALLERGIES: No known drug allergies.   PHYSICAL EXAMINATION:  VITAL SIGNS: Temperature 98.2, pulse 67, respiratory rate 18, blood pressure 144/79, saturating 96% on room air.  GENERAL: Well-nourished male who looks comfortable in bed, in no apparent distress.  HEENT: Head atraumatic, normocephalic. Pupils equal, reactive to light. Pink conjunctivae. Anicteric sclerae. Moist oral mucosa. No oral thrush. No pharyngeal erythema. No nasal discharge.  NECK: Supple. No thyromegaly. No JVD.  CHEST: Good air entry bilaterally. No wheezing, rales, or  rhonchi. No use of accessory muscles. Chest has reproducible chest pain on palpation. but does not resemble the chest pain he initially came with.  CARDIOVASCULAR: S1, S2 heard.  No rubs, murmur or gallops. PMI nondisplaced.  ABDOMEN: Soft, nontender, nondistended. Bowel sounds present. No rebound, no guarding.  EXTREMITIES: No edema. No clubbing. No cyanosis. Pedal pulses +2 bilaterally.  PSYCHIATRIC: Appropriate affect. Awake, alert x 3. Intact judgment and insight.  NEUROLOGIC: Cranial nerves grossly intact. Motor 5/5. No focal deficits.  MUSCULOSKELETAL: No joint effusion or erythema.  SKIN: Normal skin turgor. Warm and dry to touch. No cervical lymphadenopathy could be appreciated.   PERTINENT LABORATORY DATA: Glucose 100, BUN 16, creatinine 1.53, sodium 142, potassium 3.3, chloride 107, CO2 29. Troponin 0.21.  CK-MB 1. White blood cell 8.7, hemoglobin 11.6, hematocrit 35.9, platelets 222,000.  INR is 1.   EKG showing normal sinus rhythm without significant ST or T wave changes.   ASSESSMENT AND PLAN:  1. Chest pain, the patient's chest pain description is consistent with chest pain. The patient's chest pain is concerning for unstable angina, currently happening at rest resembles his previous chest pain prior to his myocardial infarctions. The patient already given aspirin. We will give him 1 dose of Lovenox treatment dose 1 mcg per kg subcutaneous x 1. Until the rest of his workup is done, he will be admitted to the telemetry floor, continue to cycle his cardiac enzymes and follow the trend. We will consult cardiology service to evaluate the patient. Currently he is chest pain-free.  Will have him on p.r.n. nitroglycerin. We will continue him back on his home medication including statin, aspirin and beta blockers.  2. Tobacco abuse. The patient was counseled.  3. Hypertension. Blood pressure acceptable. Continue with home medications. 4. Hypokalemia. We will replace. 5. Hyperlipidemia. Continue with statin. 6. Coronary artery disease. We will continue the patient on his home medication including aspirin, atorvastatin, Plavix and metoprolol.  7. Deep vein thrombosis  prophylaxis with sequential compression device and TED hose. He received 1 dose of treatment of Lovenox. We will await cardiology evaluation prior to decision to continue with treatment versus prophylaxis dose.  TOTAL TIME SPENT ON ADMISSION AND PATIENT CARE: 55 minutes.    ____________________________ Albertine Patricia, MD dse:JT D: 12/09/2013 06:05:00 ET T: 12/09/2013 06:42:41 ET JOB#: [id number]  cc: Albertine Patricia, MD, <Dictator> Katja Blue Graciela Husbands MD ELECTRONICALLY SIGNED 12/20/2013 23:37

## 2014-08-04 NOTE — H&P (Signed)
PATIENT NAME:  Peter [name], Peter [name] MR#:  [id number] DATE OF BIRTH:  [birth year]  DATE OF ADMISSION:  08/03/2013  PRIMARY CARE PHYSICIAN: Dr. Ginette Pitman. EMERGENCY ROOM PHYSICIAN: Dr. Jasmine December.   CHIEF COMPLAINT: Chest pain.   HISTORY OF PRESENT ILLNESS: This patient is a 74 year old male patient with history of coronary artery disease status post 4 stents, comes in because of chest pain. The patient started having left-sided chest pain since yesterday. The chest pain was like around 5/10 in severity. It comes and goes. The patient took aspirin, 3 of them yesterday and 2 today, and he has some lightheadedness associated with chest pain and denies any nausea or vomiting. No trouble breathing. No cough. No fever. Right now who he does have Nitropaste on and he states he does not have chest pain anymore. His first set of troponins are elevated at 0.17. Dr. Ubaldo Glassing has been contacted and he recommended heparin and aspirin, Plavix and possible left cardiac cath tomorrow.   PAST MEDICAL HISTORY: Significant for hypertension, history of abdominal aortic aneurysm and history of BPH, coronary artery disease status post 4 stents. His primary cardiologist is Dr. Clayborn Bigness. He also has a history of chronic anemia.   SOCIAL HISTORY: Previous smoker, now quit. No alcohol. No drugs. The patient lives with family.   FAMILY HISTORY: Significant for hypertension and coronary artery disease to parents.   MEDIC prilosec  20 mg p.o. daily, KCl 20 mEq 2 tablets p.o. b.i.d., vitamin B12 1 tablet p.o. daily, aspirin 81 mg daily, Plavix 75 mg p.o. daily, chondroitin and glucosamine, ascorbic acid 1 tablet p.o. daily, atorvastatin 40 mg p.o. daily, amlodipine 10 mg p.o. daily, ICaps 1 capsule p.o. daily, hydrochlorothiazide 12.5 mg p.o. daily.   ALLERGIES: No known allergies.   PAST SURGICAL HISTORY: Stent placement 4 to 5 years ago.  REVIEW OF SYSTEMS: CONSTITUTIONAL: He has no fever. No fatigue.  EYES: No blurred vision.  ENT: No  tinnitus. No ear pain. No epistaxis.  RESPIRATORY: No cough. No wheezing. No trouble breathing.  CARDIOVASCULAR: Having chest pain since yesterday. GASTROINTESTINAL: No nausea. No vomiting. No abdominal pain.  GENITOURINARY: No dysuria.  ENDOCRINE: No polyuria. No cold no polydipsia.  INTEGUMENTARY: No skin rashes.  MUSCULOSKELETAL: No joint pains.  NEUROLOGIC: No numbness or weakness secondary to insomnia.   PHYSICAL EXAMINATION: VITAL SIGNS: Temperature 97.5, heart rate 84, blood pressure initially 213/86 and during my visit blood pressure is 160/70.  GENERAL: The patient is alert, awake, oriented, elderly male, not in distress, answering questions appropriately. HEAD: Atraumatic, normocephalic.  EYES: Pupils equally reacting to light. Extraocular movements intact.  ENT: No tympanic membrane congestion. No turbinate hypertrophy. No oropharyngeal erythema.  NECK: Supple. No JVD. No carotid bruit.  CARDIOVASCULAR: S1, S2 regular. No murmurs. PMI nondisplaced. pulses are intact> dorsalis pedis  and femoral artery LUNGS: Clear to auscultation. No wheeze. The patient is not using accessory muscles with respiration.  ABDOMEN: Soft, obese bowel sounds present. No organomegaly. No hernias.  MUSCULOSKELETAL: Able to move extremities x4.  EXTREMITIES: No edema, cyanosis or clubbing.  SKIN: Warm and dry. No skin rashes. No lymphadenopathy in cervical or axillary regions.  NEUROLOGIC: Alert, awake, oriented. Cranial nerves II through XII intact. Power 5/5 in the  upper and lower extremities. Sensory intact. DTRs 2+ bilaterally.  PSYCHIATRIC: Mood and affect are within normal limits.   LABORATORY DATA: Chest x-ray shows no active cardiopulmonary disease. Troponin less than 0.17. WBC 9.2, hemoglobin 14.4, hematocrit 43.2, platelets 204. Electrolytes: Sodium 140, potassium 3.3,  chloride 103, bicarbonate 32, BUN 17, creatinine 1.37, glucose 114. BNP 201. INR is 1. The patient's EKG shows sinus rhythm  with some PVCs, 73 beats per minute.   ASSESSMENT AND PLAN: This is a 74 year old male patient with:  1.  Chest pain and elevated troponins consistent with non-ST elevation myocardial infarction. He the patient has risk factors of coronary artery disease, four stents placed. We will admit him to telemetry. Continue aspirin, beta blockers, statins, ACE inhibitors. The patient is started on heparin drip. I spoke with Dr. Ubaldo Glassing and he said he will probably do the cardiac cath tomorrow and we are going to continue aspirin, Plavix, statins and see how he does, and cycle two more sets of cardiac markers. 2.  Malignant hypertension. Initial blood pressure was very high but during my visit the blood pressure improved with Nitropaste but he received, so we will continue only his home medications with the hydrochlorothiazide, metoprolol and Norvasc and see how he does.  3.  Hypokalemia. Replace the potassium.  4.  Gastrointestinal prophylaxis with proton pump inhibitors. Deep vein thrombosis prophylaxis; he is already on heparin drip.  5.  History of abdominal aortic aneurysm. The patient is supposed to have surgery at [facility] next month and please follow the patient peripherally as he is on anticoagulation.   TIME SPENT: About 60 minutes, transfer the patient to [facility]   ____________________________ Epifanio Lesches, MD sk:lf D: 08/03/2013 20:26:30 ET T: 08/03/2013 21:21:56 ET JOB#: [id number]  cc: Epifanio Lesches, MD, <Dictator> Epifanio Lesches MD ELECTRONICALLY SIGNED 09/05/2013 19:06

## 2014-08-05 NOTE — Consult Note (Signed)
PATIENT NAME:  Peter [name], Peter [name] MR#:  [id number] DATE OF BIRTH:  16-Sep-1940  DATE OF CONSULTATION:  04/02/2011  REFERRING PHYSICIAN:  Dr. Ginette Pitman, Dr. Bernardo Heater CONSULTING PHYSICIAN:  Srihitha Tagliaferri D. Sumeya Yontz, MD  INDICATION: Angina, hematuria and urinary retention.   HISTORY OF PRESENT ILLNESS: Peter [name] is a 74 year old white male with past history of coronary artery disease, angioplasty and stenting, hyperlipidemia, hypertension, anemia, tobacco abuse, mild obesity presented to the ER with recurrent hematuria and urinary retention. He recently had a urological procedure with Dr. Bernardo Heater with cystoscopy and biopsy. No gross malignancy was noted. He developed some urinary retention several days later and has been passing clots on and off. He had been battling this problem over the last two weeks. He had some dull pain, 5/6, on his recent admission with at least 500 mL of urinary retention so ended up coming to the Emergency Room for Foley placement and further evaluation with gross hematuria. He has had recurrent mild chest pain off and on and when he had profound retention he had some chest pain, so cardiac consultation was recommended with his known coronary disease.   REVIEW OF SYSTEMS: No blackout spells, syncope. Denies nausea, vomiting. No fever. No chills. No sweats. No weight loss. No weight gain. No hemoptysis, hematemesis. He has had hematuria. No bright red blood per rectum. No vision change. No hearing change. He has had angina. He had some shortness of breath.   PAST MEDICAL HISTORY:  1. Coronary artery disease. 2. Hyperlipidemia. 3. Hypertension. 4. Anemia.  5. Tobacco abuse.  6. Obesity   PAST SURGICAL HISTORY:  1. Ventral hernia repair. 2. Angioplasty and stent. 3. Cystoscopy.   MEDICATIONS:  1. Avalide 300/12.5 once a day.  2. Vitamin B12 once every three months.  3. Niaspan 500 mg a day.  4. Norvasc 10 a day.  5. Prilosec 20 a day.  6. Toprol-XL 50 a day.  7. Hydrocodone  Tylenol 5/500 daily.  8. Doxazosin 40 daily. 19. Was on Plavix but held with his hematuria.    ALLERGIES: No drug allergies.   FAMILY HISTORY: Father died at 42 natural causes. Mother died at 43 of breast cancer.   SOCIAL HISTORY: Widowed. Smoker. Denied alcohol consumption. Retired Insurance underwriter. No drug use.   PHYSICAL EXAMINATION:  VITAL SIGNS: Blood pressure 160/80, pulse 70, respiratory rate 18, afebrile.   HEENT: Normocephalic, atraumatic. Pupils equal, reactive to light.   NECK: Supple. No jugular venous distention, bruits or adenopathy.   LUNGS: Clear to auscultation and percussion. No significant wheeze, rhonchi, rale.   HEART: Regular rate and rhythm. Positive S4. Systolic ejection murmur at the apex.   ABDOMEN: Benign. Positive bowel sounds. No rebound or tenderness.   EXTREMITIES: Within normal limits. No cyanosis, clubbing, edema.   NEUROLOGIC: Intact.   SKIN: Normal.   LABORATORY, DIAGNOSTIC AND RADIOLOGICAL DATA: White count 10, hemoglobin 12.9, hematocrit 32.8, platelet count 214. Cardiac enzymes negative.   ASSESSMENT:  1. History of angina. 2. History of coronary artery disease. 3. Hypertension. 4. Hyperlipidemia. 5. Obesity. 6. Smoking. 7. Hematuria. 8. History of malignant hypertension.  9. Urinary tract infection.  10. Chest pain.   PLAN: Agree with admit. Continue urology evaluation. Continue Foley until you are able to remove it and he can void on his own without significant bleeding. Follow up hemoglobin and hematocrit. Advised to quit smoking. Continue cardiac medications for blood pressure as well as his heart including metoprolol and Avalide. Refrain from anticoagulation like aspirin and Plavix until his  hematuria improves. Follow up CBC for possible urinary tract infection and anemia. Will hold off on further cardiac studies at this point. He has had a history of abdominal aortic aneurysm. Will probably follow that as an outpatient. There is no clear  evidence of this playing a significant role. Malignant hypertension control with blood pressure medications and will add further medications or increase the dose accordingly as needed. Patient has mild renal insufficiency. Will continue to follow that. Also continue lipid management with Niaspan and statin as necessary. Will continue to follow.  ____________________________ Peter Senters Peter Bigness, MD ddc:cms D: 04/03/2011 14:01:00 ET T: 04/03/2011 15:55:51 ET JOB#: [id number]  cc: Terence Googe D. Peter Bigness, MD, <Dictator> Yolonda Kida MD ELECTRONICALLY SIGNED 04/23/2011 5:58

## 2014-08-05 NOTE — Op Note (Signed)
PATIENT NAME:  Peter [name], Peter [name] MR#:  [id number] DATE OF BIRTH:  [birth year]  DATE OF PROCEDURE:  05/27/2011  PREOPERATIVE DIAGNOSIS: Prior abnormal bladder biopsy.   POSTOPERATIVE DIAGNOSIS: Prior abnormal bladder biopsy.   PROCEDURE: Cystoscopy with bladder biopsies.   SURGEON: Brunette Lavalle C. Bernardo Heater, MD  ASSISTANT: None.   ANESTHESIA: General.   INDICATIONS: 73 year old male admitted early December 2012 with gross hematuria and clot retention. He underwent cystoscopy with clot evacuation on 04/02/2011. Cystoscopy at that time remarkable for marked areas of hemorrhage and edema at the bladder base and posterior wall felt consistent with previous indwelling catheter and irrigation. He underwent fulguration and resection of some of these areas. Pathology returned "favors carcinoma in situ". His hematuria has subsequently resolved and his catheter has been out for several weeks. He presents for follow-up bladder biopsies prior to committing to BCG therapy..   DESCRIPTION OF PROCEDURE: Patient taken to Operating Room where a general anesthetic was administered. He was placed in the low lithotomy position and his external genitalia were prepped and draped sterilely. Timeout was performed. A 21 French cystoscope with 30 degree lens was lubricated and passed under direction vision. The urethra was normal in caliber without stricture. Prostate remarkable for lateral lobe enlargement and moderate bladder neck elevation. Bladder mucosa was closely inspected and the ureteral orifices are normal appearing with clear efflux. No solid or papillary lesions are identified. At the right bladder base adjacent to an area of a scar from resection site is a small area of erythema which was biopsied with cold forceps. There was also a slight area of erythema on the posterior wall which three cold biopsies were taken. Biopsy sites were fulgurated with Bugbee electrode. No significant bleeding was noted. Bladder mucosa was then  closely inspected and no other abnormalities were identified. Cystoscope was removed. A 16 French Foley catheter was placed with return of clear effluent upon irrigation. He was taken to PAC-U in stable condition.   There were no complications. EBL minimal.   ____________________________ Ronda Fairly. Bernardo Heater, MD scs:cms D: 05/27/2011 10:43:40 ET T: 05/27/2011 11:13:31 ET JOB#: [id number]  cc: Nicki Reaper C. Bernardo Heater, MD, <Dictator> Abbie Sons MD ELECTRONICALLY SIGNED 05/29/2011 3:17

## 2014-08-05 NOTE — Consult Note (Signed)
PATIENT NAME:  Peter [name], Peter [name] MR#:  [id number] DATE OF BIRTH:  [birth year]  DATE OF CONSULTATION:  03/20/2011  REFERRING PHYSICIAN:  Dr. Royden Purl  CONSULTING PHYSICIAN:  Nicki Reaper C. Bernardo Heater, MD  REASON FOR CONSULTATION: Gross hematuria.  HISTORY OF PRESENT ILLNESS: 74 year old white male who stated 2 to 3 days prior to admission he noticed pinkish appearing urine. On 12/06 he had onset of gross painless hematuria with clot. He was evaluated in the Emergency Department. The urologist on call last night recommended Foley catheter placement with irrigation. Catheter was placed, however, was not draining and leaking around the catheter. The hospitalist service agreed to admit the patient if urology consultation could be obtained. He denies any previous history of gross hematuria for urologic problem. He has a history of coronary artery disease status post stenting x3 and is on aspirin and Plavix. His medications have currently been held. Denies fever or chills. A two-way Foley catheter was placed in the Emergency Department. The nursing staff contacted me this afternoon stating he had multiple clots and the catheter was unable to be irrigated. Placement of a three-way Foley catheter with manual irrigation was recommended which has been performed.   PAST MEDICAL HISTORY:  1. Coronary artery disease status post stenting x3; the last in 2009.  2. Hypertension. 3. Hyperlipidemia.  4. History of anemia.   5. Tobacco abuse.   PAST SURGICAL HISTORY:  1. Ventral hernia repair.  2. Coronary stenting.   MEDICATIONS ON ADMISSION:  1. Toprol-XL 50 mg daily.  2. Simvastatin 80 mg daily.  3. Potassium chloride 20 mEq b.i.d.  4. Plavix 75 mg daily.  5. Glucosamine 500 mg daily.  6. Doxazosin 8 mg daily.  7. Chondroitin 400 mg daily.  8. Avalide 300/12.5 daily.  9. ASA 81 mg daily. 10. Amlodipine 10 mg daily.   ALLERGIES: No known drug allergies.   SOCIAL HISTORY: Patient is widowed. He is a 1.5 pack per  day smoker for greater than 50 years. He is a retired Insurance underwriter.    REVIEW OF SYSTEMS: GENERAL: No fever or chills. RESPIRATORY: Denies cough, shortness of breath. CARDIOVASCULAR: No chest pain, palpitations. GASTROINTESTINAL: Abdominal discomfort secondary to bladder fullness. GENITOURINARY: As per the history of present illness. HEME: History of anemia. NEUROLOGIC: No cerebrovascular accident/seizure. Remainder of review of systems otherwise noncontributory except as per the history of present illness.   PHYSICAL EXAMINATION:  VITAL SIGNS: Temperature 97.8, blood pressure 146/81, pulse 69.   GENERAL: Patient is in no acute distress.    ABDOMEN: The abdomen is soft, nontender. Bladder not palpable at the cusp.   GENITOURINARY: Phallus without lesions. Testes descended without mass or tenderness. Prostate examination was deferred at this time. Three-way Foley catheter present and the CBI effluent is clear on low flow.   LABORATORY, DIAGNOSTIC AND RADIOLOGICAL DATA: Creatinine 1.23, hemoglobin and hematocrit 12.77/37.6, platelets 172,000. CT scan of the abdomen and pelvis was performed on 12/07 which was a noncontrast study. Kidney showed no calculi or hydronephrosis. There was hyperdense material within the bladder consistent with clot. Incidentally was noted a 4.5 to 5 cm abdominal aortic aneurysm.    IMPRESSION: Gross hematuria/clot retention-This most likely is from a lower tract source, however, upper tract imaging is suboptimal, was only a noncontrast study. I manually irrigated his catheter and there are no remaining clots.   RECOMMENDATION:  1. Continue CBI overnight and if effluent is clear in the morning convert to gravity drainage.  2. He will need cystoscopy for lower tract  evaluation and if hematuria resolves this would best be performed as an outpatient.  3. Agree with holding aspirin and Plavix for now.  4. Dr. Irene Shipper is covering this weekend and will ask her to follow  patient.  ____________________________ Ronda Fairly. Bernardo Heater, MD scs:cms D: 03/20/2011 16:24:08 ET T: 03/20/2011 17:13:53 ET JOB#: [id number]  cc: Nicki Reaper C. Bernardo Heater, MD, <Dictator> Abbie Sons MD ELECTRONICALLY SIGNED 04/29/2011 7:27

## 2014-08-05 NOTE — Discharge Summary (Signed)
PATIENT NAME:  Peter [name], Peter [name] MR#:  [id number] DATE OF BIRTH:  1941-01-25  DATE OF ADMISSION:  04/03/2011 DATE OF DISCHARGE:  04/05/2011  DISCHARGE DIAGNOSES:  1. Painless gross hematuria post cystoscopy with blood loss anemia.  2. Hypertension.  3. History of abdominal aortic aneurysm.  4. Coronary artery disease status post stent.  5. Tobacco use disorder. 6. Chest pain, myocardial infarction ruled out.   CHIEF COMPLAINT: Gross hematuria with abdominal pain and urinary retention.   HISTORY OF PRESENT ILLNESS:  Peter [name] is a 74 year old male with a history of coronary artery disease status post stent, history of hyperlipidemia, hypertension, anemia, and tobacco use who presented to the ER with gross hematuria. The patient had been recently discharged after cystoscopy with biopsy on 03/26/2011 but later on the patient developed urinary retention with abdominal discomfort and started to have problems with hematuria on 12/17 and subsequently noticed also some chest discomfort in the left sternal border associated with nausea.  PAST MEDICAL HISTORY:  1. Coronary artery disease status post stenting.  2. Hyperlipidemia.  3. Benign hypertension.  4. Anemia.  5. Tobacco use.  6. Previous ventral hernia repair. 7. Stents. 8. Cystoscopy.   CURRENT MEDICATIONS: 1. Avalide 300/12.5, 1 tablet a day.  2. Vitamin B12 1000 mcg every three months. 3. Niaspan 500 mg every other day. 4. Norvasc 10 mg a day.  5. Prilosec 20 mg a day.  6. Toprol-XL 50 mg a day.  7. Hydrocodone p.r.n.  8. Doxazosin 4 mg a day.  The patient recently stopped his aspirin and Plavix.   PHYSICAL EXAMINATION: GENERAL: He was in some discomfort. VITAL SIGNS: Afebrile, heart rate was 67, respirations 18, blood pressure 160/78, oxygen saturation 100% on room air.  HEENT: Pupils equal and reactive to light. Extraocular movements intact. Oropharynx normal. HEART: S1, S2. LUNGS: Clear. ABDOMEN: Soft, nontender.  EXTREMITIES: No edema. NEUROLOGIC: Nonfocal.   LABS/STUDIES: WBC count was 10.2, hemoglobin 12.9, hematocrit 32.8, platelets 214.   HOSPITAL COURSE: The patient was admitted to [facility], was also seen by urologists Dr. Jacqlyn Larsen as well as Dr. Bernardo Heater.  Dr. Jacqlyn Larsen had a long discussion with him in going through the possible etiology of his symptoms. The patient was placed on Flomax. His Foley catheter was removed. His hemoglobin did drop to 10.2 but he was stable and was able to void without difficulty. He was also seen in consultation with cardiology, Dr. Clayborn Bigness, who felt that he could continue to hold off his Plavix at this point. The patient was reassured and advised to follow up with Dr. Bernardo Heater.     DISCHARGE MEDICATIONS:  1. Flomax 0.4 mg a day.  2. Amlodipine 10 mg a day.  3. KCl 20 mEq b.i.d. 4. Avalide 300/12.5 1 tablet daily. 5. Omeprazole 20 mg daily. 6. Niaspan 500 mg every other day.   FOLLOWUP: The patient has been advised to follow with me, Dr. Ginette Pitman, in 1 to 2 weeks and also keep his follow-up appointment with Dr. Bernardo Heater as scheduled. He was advised to return to the ER or call us if he has further episodes of hematuria.   ____________________________ Tracie Harrier, MD vh:bjt D:  04/06/2011 11:39:23 ET          T: 04/08/2011 10:57:10 ET JOB#: [id number] Tracie Harrier MD ELECTRONICALLY SIGNED 04/21/2011 17:38

## 2014-08-05 NOTE — Op Note (Signed)
PATIENT NAME:  Peter [name], Peter [name] MR#:  [id number] DATE OF BIRTH:  [birth year]  DATE OF PROCEDURE:  03/25/2011  PREOPERATIVE DIAGNOSIS: Gross hematuria.  POSTOPERATIVE DIAGNOSIS: Gross hematuria.  PROCEDURES:  1. Cystoscopy. 2. Bilateral retrograde pyelograms.  3. TURBT.   SURGEON: Tekeyah Santiago C. Bernardo Heater, MD   ASSISTANT: None.   ANESTHETIC: General.   INDICATIONS: This is a 75 year old white male who was admitted on December 7th with gross hematuria which required continuous bladder irrigation. His continuous bladder irrigation was stopped after 48 hours, however, he has had low-grade hematuria. He was discharged and presents for cystoscopy under anesthesia. He was on aspirin and Plavix at the time of his admission.   DESCRIPTION OF PROCEDURE: The patient was taken to the operating room where a general anesthetic was administered. He was placed in the low lithotomy position and his external genitalia were prepped and draped sterilely. Time-out was performed per protocol. A 21 French cystoscope with 30 degree lens was lubricated and passed under direct vision. The urethra was normal in caliber without stricture. Prostate demonstrates lateral lobe enlargement with the lateral lobes almost touching. Bladder neck was moderately elevated. There were prominent superficial vessels within the prostatic urethra, although no active bleeding was noted. The bladder mucosa was closely inspected and there were marked areas of hemorrhage and edema at the bladder base lateral wall and posterior wall which were felt most likely secondary to chronic indwelling of the previous catheter placement. On the posterior wall was an area with a clot that was slightly more edematous and hemorrhagic No active bleeding was seen. The cystoscope was removed. A 26 French continuous flow resectoscope with look in obturator was passed under direct vision. The patient had a noncontrast CT of the abdomen and pelvis performed on admission  which showed no abnormalities. He could not receive contrast secondary to chronic kidney disease. It was elected to proceed with bilateral retrograde pyelograms. The ureteral orifices demonstrated clear efflux bilaterally. An 8 [place] cone-tipped catheter was placed through an adapter in the resectoscope and a left retrograde pyelogram demonstrates normal appearing collecting system and ureter without hydronephrosis, stricture, or filling defect. Right retrograde pyelogram was also normal in appearance. The hemorrhagic area on the posterior wall was resected with the [facility] resectoscope down to superficial muscle. Some additional sampling of hemorrhagic areas were also taken. The resection sites were fulgurated with a button electrode. At the completion of the procedure, no active bleeding was noted. The resectoscope was removed. A 20 French Foley was placed with return of pink-tinged effluent upon irrigation. The patient was taken to the PAC-U in stable condition. There were no complications. EBL was minimal.   ____________________________ Ronda Fairly. Bernardo Heater, MD scs:drc D: 03/26/2011 17:35:00 ET T: 03/27/2011 06:33:17 ET JOB#: [id number]  cc: Nicki Reaper C. Bernardo Heater, MD, <Dictator>  Abbie Sons MD ELECTRONICALLY SIGNED 04/29/2011 7:28

## 2014-08-05 NOTE — Discharge Summary (Signed)
PATIENT NAME:  Peter [name], Peter [name] MR#:  [id number] DATE OF BIRTH:  [birth year]  DATE OF ADMISSION:  03/20/2011 DATE OF DISCHARGE:  03/26/2011  ADMISSION DIAGNOSIS: Gross hematuria.   DISCHARGE DIAGNOSIS: Gross hematuria.  PROCEDURE: On 03/25/2011, cystoscopy under anesthesia with bilateral retrograde pyelograms and transurethral resection of bladder tumor.   HISTORY OF PRESENT ILLNESS: The patient is a 74 year old male who presented to the Emergency Department  on 03/20/2011 with gross hematuria. This did not clear with catheter placement. He was admitted to the Hospitalist Service. Refer to the admission History and Physical for details.   HOSPITAL COURSE: The patient was admitted and Urology consultation was obtained on 03/20/2011. He had had clot retention and his Foley catheter was unable to be irrigated. A three-way Foley catheter was ordered with continuous bladder irrigation. All clots were removed in the bladder, and his urine rapidly cleared on CBI. His CBI was discontinued on 03/21/2011, however, restarted that evening for mild recurrent hematuria. It was again discontinued on 03/22/2011, and he had no significant bleeding throughout the remainder of his hospitalization. He was having mild hematuria, and cystoscopy could not be performed until 03/25/2011. He elected to stay as an inpatient.   Cystoscopy was performed on 03/25/2011, which showed no definite bladder tumor. The upper tracts were normal in appearance. There was a hemorrhagic area on the posterior wall which was resected. On 03/26/2011, which was the day of discharge, his urine was pink-tinged, and his catheter was removed. He was able to void pink-tinged urine without difficulty.   DISPOSITION: The patient is discharged to home with no strenuous activity for the next two weeks.   DISCHARGE MEDICATIONS: Cipro 500 mg p.o. b.i.d.    FOLLOW UP INSTRUCTIONS:  1. He will follow up at [facility] in approximately 2 to 3 weeks  and was instructed to contact the office for recurring hematuria. He will be contacted with his pathology report.  2. Hold aspirin and Plavix until followup.   ____________________________ Ronda Fairly. Bernardo Heater, MD scs:cbb D: 03/26/2011 17:41:23 ET T: 03/27/2011 09:40:12 ET JOB#: [id number]  cc: Nicki Reaper C. Bernardo Heater, MD, <Dictator> Tracie Harrier, MD Abbie Sons MD ELECTRONICALLY SIGNED 04/29/2011 7:28

## 2014-12-27 ENCOUNTER — Ambulatory Visit: Payer: Self-pay | Admitting: Urology

## 2015-01-17 ENCOUNTER — Encounter: Payer: Self-pay | Admitting: Urology

## 2015-01-17 ENCOUNTER — Ambulatory Visit (INDEPENDENT_AMBULATORY_CARE_PROVIDER_SITE_OTHER): Payer: Medicare Other | Admitting: Urology

## 2015-01-17 VITALS — BP 176/103 | HR 83 | Resp 16 | Ht 70.0 in | Wt 230.7 lb

## 2015-01-17 DIAGNOSIS — I716 Thoracoabdominal aortic aneurysm, without rupture, unspecified: Secondary | ICD-10-CM | POA: Insufficient documentation

## 2015-01-17 DIAGNOSIS — Z720 Tobacco use: Secondary | ICD-10-CM | POA: Insufficient documentation

## 2015-01-17 DIAGNOSIS — E7800 Pure hypercholesterolemia, unspecified: Secondary | ICD-10-CM | POA: Insufficient documentation

## 2015-01-17 DIAGNOSIS — I509 Heart failure, unspecified: Secondary | ICD-10-CM | POA: Insufficient documentation

## 2015-01-17 DIAGNOSIS — R972 Elevated prostate specific antigen [PSA]: Secondary | ICD-10-CM

## 2015-01-17 DIAGNOSIS — I739 Peripheral vascular disease, unspecified: Secondary | ICD-10-CM | POA: Insufficient documentation

## 2015-01-17 DIAGNOSIS — N400 Enlarged prostate without lower urinary tract symptoms: Secondary | ICD-10-CM

## 2015-01-17 DIAGNOSIS — R310 Gross hematuria: Secondary | ICD-10-CM | POA: Diagnosis not present

## 2015-01-17 LAB — URINALYSIS, COMPLETE
Bilirubin, UA: NEGATIVE
Glucose, UA: NEGATIVE
KETONES UA: NEGATIVE
Leukocytes, UA: NEGATIVE
NITRITE UA: NEGATIVE
SPEC GRAV UA: 1.02 (ref 1.005–1.030)
UUROB: 0.2 mg/dL (ref 0.2–1.0)
pH, UA: 6 (ref 5.0–7.5)

## 2015-01-17 LAB — MICROSCOPIC EXAMINATION
Bacteria, UA: NONE SEEN
Epithelial Cells (non renal): NONE SEEN /hpf (ref 0–10)
Renal Epithel, UA: NONE SEEN /hpf

## 2015-01-17 LAB — BLADDER SCAN AMB NON-IMAGING

## 2015-01-17 MED ORDER — FINASTERIDE 5 MG PO TABS: 5.0000 mg | ORAL_TABLET | Freq: Every day | ORAL | Status: DC

## 2015-01-17 NOTE — Progress Notes (Signed)
01/17/2015 3:27 PM   Peter [name] [birth year] [id number]  Referring provider: Tracie Harrier, MD [address] [facility] [place], [place] [address]  Chief Complaint  Patient presents with  . Elevated PSA  . Establish Care    HPI: Patient is a 74 year old male with multiple medical comorbidities including multiple heart attacks who is on aspirin and Plavix who presents with an elevated PSA of 6.2. The patient has significant urinary symptoms. His I PSS score is 32 with a quality of life score of 3. He notes frequency, urgency, nocturia every one half hours, incontinence, intermittency, trouble starting stream, straining, and weak stream. He is on Flomax at this time. She denies other urological abnormalities.  In 2012, he developed gross hematuria and eventually underwent a bladder biopsy which was read as "favors carcinoma in situ. He noted a repeat biopsy in 2013 which showed chronic inflammation but was benign. He has not had gross hematuria since that time. Urinalysis today shows no red blood cells.  PMH: Past Medical History  Diagnosis Date  . Hypertension   . Asthma   . 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 High cholesterol   . Anginal pain ([place])   . Coronary artery disease   . Shortness of breath 12/14/2011    "@ rest, lying down, w/exertion"  . Bladder infection, acute 03/2011    "had a whole lot of bleeding from this"  . Myocardial infarction ([place]) 1996  . NSTEMI (non-ST elevated myocardial infarction) ([place]) 12/14/2011    Surgical History: Past Surgical History  Procedure Laterality Date  . Tonsillectomy      "I was a kid"  . Hernia repair  ~ 2001    "abdominal w/mesh implanted"  . Coronary angioplasty with stent placement  1996; ~ 2003; ~ 2008    "total of 3"  . Left heart catheterization with coronary angiogram N/A 12/16/2011    Procedure: LEFT HEART CATHETERIZATION WITH CORONARY ANGIOGRAM;   Surgeon: Minus Breeding, MD;  Location: [facility] CATH LAB;  Service: Cardiovascular;  Laterality: N/A;  . Percutaneous coronary stent intervention (pci-s) N/A 12/17/2011    Procedure: PERCUTANEOUS CORONARY STENT INTERVENTION (PCI-S);  Surgeon: Sherren Mocha, MD;  Location: [facility] CATH LAB;  Service: Cardiovascular;  Laterality: N/A;    Home Medications:    Medication List       This list is accurate as of: 01/17/15  3:27 PM.  Always use your most recent med list.               amLODipine-benazepril 10-20 MG capsule  Commonly known as:  LOTREL  Take 1 capsule by mouth daily.     aspirin 325 MG tablet  Take 325 mg by mouth as needed. Patient takes if there is any pain or burning in the chest.     atorvastatin 40 MG tablet  Commonly known as:  LIPITOR  Take 1 tablet (40 mg total) by mouth daily at 6 PM.     clobetasol cream 0.05 %  Commonly known as:  TEMOVATE  APPLY TO PSORIASIS BID UNTIL SMOOTH     clopidogrel 75 MG tablet  Commonly known as:  PLAVIX  TAKE 4 TABLETS BY MOUTH ON THE FIRST DAY, THEN 1 TABLET DAILY THEREAFTER     cyanocobalamin 1000 MCG tablet  Take 100 mcg by mouth daily.     finasteride 5 MG tablet  Commonly known as:  PROSCAR  Take  1 tablet (5 mg total) by mouth daily.     Glucosamine-Chondroitin-Vit C 2000-1200-60 MG/30ML Liqd  Take by mouth.     ICAPS PO  Take 1 capsule by mouth daily.     potassium chloride SA 20 MEQ tablet  Commonly known as:  K-DUR,KLOR-CON  Take 40 mEq by mouth 2 (two) times daily.     tamsulosin 0.4 MG Caps capsule  Commonly known as:  FLOMAX  Take 0.4 mg by mouth daily after breakfast.     WAL-ZYR 10 MG tablet  Generic drug:  cetirizine  Take 10 mg by mouth.        Allergies:  Allergies  Allergen Reactions  . Iodinated Diagnostic Agents Shortness Of Breath    Family History: Family History  Problem Relation Age of Onset  . Family history unknown: Yes    Social History:  reports that he has been smoking Cigarettes.   He has a 79.5 pack-year smoking history. He has never used smokeless tobacco. He reports that he drinks about 0.6 oz of alcohol per week. He reports that he does not use illicit drugs.  ROS: UROLOGY Frequent Urination?: Yes Hard to postpone urination?: Yes Burning/pain with urination?: No Get up at night to urinate?: Yes Leakage of urine?: Yes Urine stream starts and stops?: Yes Trouble starting stream?: Yes Do you have to strain to urinate?: Yes Blood in urine?: No Urinary tract infection?: No Sexually transmitted disease?: No Injury to kidneys or bladder?: Yes Painful intercourse?: No Weak stream?: Yes Erection problems?: Yes Penile pain?: No  Gastrointestinal Nausea?: No Vomiting?: Yes Indigestion/heartburn?: Yes Diarrhea?: Yes Constipation?: No  Constitutional Fever: No Night sweats?: Yes Weight loss?: Yes Fatigue?: Yes  Skin Skin rash/lesions?: Yes Itching?: Yes  Eyes Blurred vision?: Yes Double vision?: No  Ears/Nose/Throat Sore throat?: No Sinus problems?: Yes  Hematologic/Lymphatic Swollen glands?: No Easy bruising?: Yes  Cardiovascular Leg swelling?: No Chest pain?: No  Respiratory Cough?: Yes Shortness of breath?: Yes  Endocrine Excessive thirst?: No  Musculoskeletal Back pain?: Yes Joint pain?: No  Neurological Headaches?: No Dizziness?: No  Psychologic Depression?: No Anxiety?: No  Physical Exam: BP 176/103 mmHg  Pulse 83  Resp 16  Ht 5\' 10"  (1.778 m)  Wt 230 lb 11.2 oz (104.645 kg)  BMI 33.10 kg/m2  Constitutional:  Alert and oriented, No acute distress. HEENT: [place] AT, moist mucus membranes.  Trachea midline, no masses. Cardiovascular: No clubbing, cyanosis, or edema. Respiratory: Normal respiratory effort, no increased work of breathing. GI: Abdomen is soft, nontender, nondistended, no abdominal masses GU: No CVA tenderness. Normal phallus. Testicles descended equally bilateral. No masses. DRE: 80 g prostate smooth no  nodules. Skin: No rashes, bruises or suspicious lesions. Lymph: No cervical or inguinal adenopathy. Neurologic: Grossly intact, no focal deficits, moving all 4 extremities. Psychiatric: Normal mood and affect.  Laboratory Data: Lab Results  Component Value Date   WBC 9.3 12/12/2013   HGB 10.5* 12/12/2013   HCT 31.8* 12/12/2013   MCV 79* 12/12/2013   PLT 211 12/12/2013    Lab Results  Component Value Date   CREATININE 1.34* 12/12/2013    No results found for: PSA  No results found for: TESTOSTERONE  No results found for: HGBA1C  Urinalysis    Component Value Date/Time   COLORURINE RED* 04/06/2011 0900   APPEARANCEUR TURBID* 04/06/2011 0900   LABSPEC 1.009 04/06/2011 0900   PHURINE 7.5 04/06/2011 0900   GLUCOSEU NEGATIVE 04/06/2011 0900   HGBUR LARGE* 04/06/2011 0900   BILIRUBINUR NEGATIVE  04/06/2011 0900   KETONESUR NEGATIVE 04/06/2011 0900   PROTEINUR 30* 04/06/2011 0900   UROBILINOGEN 0.2 04/06/2011 0900   NITRITE NEGATIVE 04/06/2011 0900   LEUKOCYTESUR MODERATE* 04/06/2011 0900    PVR: 142  Assessment & Plan:    I had a long discussion with the patient regarding PSA testing. We discussed the controversies in the guidelines. We also discussed that in general it is recommended to stop PSA testing at the age of 18. Due to his multiple comorbidities including 3 heart attacks, I discussed with the patient that further pursuing his elevated PSA is not in his best interest. He is agreeable. Furthermore if we did do a prostate biopsy, he will need to come off his aspirin and Plavix which puts him at undue risk given his 3 heart attacks. Patient is agreeable with this plan.  1. BPH with significant LUTS We will continue his Flomax. Given his large volume prostate, he will be started on finasteride 5mg  daily. He is aware this will take approximately 6 weeks to have its full effect. He will follow-up in 3 months with PVR.  2. Elevated PSA As discussed above, no further  workup at this time.  3. History of gross hematuria No recent gross hematuria. His UA today is unremarkable for significant red blood cells.   Return in about 3 months (around 04/19/2015) for PVR.  Nickie Retort, MD  [facility] [address] [place], [place] [address] [phone]

## 2015-04-23 ENCOUNTER — Ambulatory Visit (INDEPENDENT_AMBULATORY_CARE_PROVIDER_SITE_OTHER): Payer: Medicare Other | Admitting: Urology

## 2015-04-23 VITALS — BP 184/69 | HR 88 | Ht 70.0 in | Wt 232.2 lb

## 2015-04-23 DIAGNOSIS — N401 Enlarged prostate with lower urinary tract symptoms: Secondary | ICD-10-CM

## 2015-04-23 DIAGNOSIS — N138 Other obstructive and reflux uropathy: Secondary | ICD-10-CM

## 2015-04-23 LAB — URINALYSIS, COMPLETE
BILIRUBIN UA: NEGATIVE
Glucose, UA: NEGATIVE
KETONES UA: NEGATIVE
Leukocytes, UA: NEGATIVE
NITRITE UA: NEGATIVE
SPEC GRAV UA: 1.02 (ref 1.005–1.030)
Urobilinogen, Ur: 0.2 mg/dL (ref 0.2–1.0)
pH, UA: 7 (ref 5.0–7.5)

## 2015-04-23 LAB — BLADDER SCAN AMB NON-IMAGING

## 2015-04-23 LAB — MICROSCOPIC EXAMINATION

## 2015-04-23 NOTE — Progress Notes (Signed)
75 year old white male who presents today for follow-up of obstructive voiding symptoms. The patient was seen in October 2016 by Dr. Pilar Jarvis who in addition to tamsulosin 0.4 mg added finasteride 5 mg daily. The patient follows up today for an interval evaluation.   When the patient was initially seen he had a PSA of 6.2. After discussion with the patient including his comorbidities 3 heart attacks and his tobacco abuse history along with his age and decision was made to discontinue PSA screening.   The patient's IP assess score was 32 with a quality of life score of 3. He was complaining of frequency, urgency, nocturia, and incontinence. He was complaining of a weak stream. Since initiating finasteride the patient notes that he has nocturia 0-1. He no longer has obstructive voiding symptoms including a weak stream and incomplete bladder emptying. He does not have to strain that he is not having any urgency, frequency, or incontinence.   The patient denies any gross hematuria or dysuria.   The patient does have a past medical history of gross hematuria which ultimately when evaluated revealed chronic inflammation.  Current Outpatient Prescriptions on File Prior to Visit  Medication Sig Dispense Refill  . amLODipine-benazepril (LOTREL) 10-20 MG per capsule Take 1 capsule by mouth daily.      Marland Kitchen aspirin 325 MG tablet Take 325 mg by mouth as needed. Patient takes if there is any pain or burning in the chest.    . cetirizine (WAL-ZYR) 10 MG tablet Take 10 mg by mouth.    . clobetasol cream (TEMOVATE) 0.05 % APPLY TO PSORIASIS BID UNTIL SMOOTH  1  . clopidogrel (PLAVIX) 75 MG tablet TAKE 4 TABLETS BY MOUTH ON THE FIRST DAY, THEN 1 TABLET DAILY THEREAFTER    . cyanocobalamin 1000 MCG tablet Take 100 mcg by mouth daily.    . finasteride (PROSCAR) 5 MG tablet Take 1 tablet (5 mg total) by mouth daily. 30 tablet 11  . Glucosamine-Chondroitin-Vit C 2000-1200-60 MG/30ML LIQD Take by mouth.    . Multiple  Vitamins-Minerals (ICAPS PO) Take 1 capsule by mouth daily.    . potassium chloride SA (K-DUR,KLOR-CON) 20 MEQ tablet Take 40 mEq by mouth 2 (two) times daily.    . Tamsulosin HCl (FLOMAX) 0.4 MG CAPS Take 0.4 mg by mouth daily after breakfast.     . atorvastatin (LIPITOR) 40 MG tablet Take 1 tablet (40 mg total) by mouth daily at 6 PM. 30 tablet 0   No current facility-administered medications on file prior to visit.   Past Medical History  Diagnosis Date  . Hypertension   . Asthma   . 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 High cholesterol   . Anginal pain ([place])   . Coronary artery disease   . Shortness of breath 12/14/2011    "@ rest, lying down, w/exertion"  . Bladder infection, acute 03/2011    "had a whole lot of bleeding from this"  . Myocardial infarction ([place]) 1996  . NSTEMI (non-ST elevated myocardial infarction) ([place]) 12/14/2011   Past Surgical History  Procedure Laterality Date  . Tonsillectomy      "I was a kid"  . Hernia repair  ~ 2001    "abdominal w/mesh implanted"  . Coronary angioplasty with stent placement  1996; ~ 2003; ~ 2008    "total of 3"  . Left heart catheterization with coronary angiogram N/A 12/16/2011    Procedure: LEFT HEART  CATHETERIZATION WITH CORONARY ANGIOGRAM;  Surgeon: Minus Breeding, MD;  Location: [facility] CATH LAB;  Service: Cardiovascular;  Laterality: N/A;  . Percutaneous coronary stent intervention (pci-s) N/A 12/17/2011    Procedure: PERCUTANEOUS CORONARY STENT INTERVENTION (PCI-S);  Surgeon: Sherren Mocha, MD;  Location: [facility] CATH LAB;  Service: Cardiovascular;  Laterality: N/A;   PE: Filed Vitals:   04/23/15 1115  BP: 184/69  Pulse: 88   NAD  UA - not  Available for analysis today   PVR: 21 mL   Impression: 75 year old male with obstructive voiding symptoms who is now on finasteride and tamsulosin. The patient's symptoms have improved quite significantly. He is very happy with his  progress.   Recommendation: I recommended the patient continue with finasteride 5 mg daily as well as tamsulosin 0.4 mg daily. The patient will return in October for a digital rectal exam and a IPPS/PVR.

## 2015-06-20 ENCOUNTER — Emergency Department: Payer: Medicare Other

## 2015-06-20 ENCOUNTER — Encounter: Payer: Self-pay | Admitting: Emergency Medicine

## 2015-06-20 ENCOUNTER — Emergency Department
Admission: EM | Admit: 2015-06-20 | Discharge: 2015-06-20 | Disposition: A | Discharge status: Other Acute Inpatient Hospital | Payer: Medicare Other | Attending: Emergency Medicine | Admitting: Emergency Medicine

## 2015-06-20 DIAGNOSIS — I7101 Dissection of thoracic aorta: Secondary | ICD-10-CM | POA: Diagnosis not present

## 2015-06-20 DIAGNOSIS — F17210 Nicotine dependence, cigarettes, uncomplicated: Secondary | ICD-10-CM | POA: Insufficient documentation

## 2015-06-20 DIAGNOSIS — Z7902 Long term (current) use of antithrombotics/antiplatelets: Secondary | ICD-10-CM | POA: Diagnosis not present

## 2015-06-20 DIAGNOSIS — Z79899 Other long term (current) drug therapy: Secondary | ICD-10-CM | POA: Diagnosis not present

## 2015-06-20 DIAGNOSIS — R1011 Right upper quadrant pain: Secondary | ICD-10-CM

## 2015-06-20 DIAGNOSIS — I71019 Dissection of thoracic aorta, unspecified: Secondary | ICD-10-CM

## 2015-06-20 DIAGNOSIS — I10 Essential (primary) hypertension: Secondary | ICD-10-CM | POA: Insufficient documentation

## 2015-06-20 DIAGNOSIS — R52 Pain, unspecified: Secondary | ICD-10-CM

## 2015-06-20 DIAGNOSIS — R109 Unspecified abdominal pain: Secondary | ICD-10-CM

## 2015-06-20 DIAGNOSIS — R079 Chest pain, unspecified: Secondary | ICD-10-CM | POA: Diagnosis present

## 2015-06-20 LAB — TROPONIN I
TROPONIN I: 0.07 ng/mL — AB (ref <0.031)
Troponin I: 0.06 ng/mL — ABNORMAL HIGH (ref <0.031)

## 2015-06-20 LAB — HEPATIC FUNCTION PANEL
ALBUMIN: 3.3 g/dL — AB (ref 3.5–5.0)
ALK PHOS: 129 U/L — AB (ref 38–126)
ALT: 11 U/L — AB (ref 17–63)
AST: 20 U/L (ref 15–41)
Bilirubin, Direct: 0.1 mg/dL (ref 0.1–0.5)
Indirect Bilirubin: 0.3 mg/dL (ref 0.3–0.9)
TOTAL PROTEIN: 5.9 g/dL — AB (ref 6.5–8.1)
Total Bilirubin: 0.4 mg/dL (ref 0.3–1.2)

## 2015-06-20 LAB — URINALYSIS COMPLETE WITH MICROSCOPIC (ARMC ONLY)
BACTERIA UA: NONE SEEN
Bilirubin Urine: NEGATIVE
Glucose, UA: NEGATIVE mg/dL
Hgb urine dipstick: NEGATIVE
KETONES UR: NEGATIVE mg/dL
Leukocytes, UA: NEGATIVE
Nitrite: NEGATIVE
PH: 7 (ref 5.0–8.0)
PROTEIN: 100 mg/dL — AB
SQUAMOUS EPITHELIAL / LPF: NONE SEEN
Specific Gravity, Urine: 1.011 (ref 1.005–1.030)

## 2015-06-20 LAB — CBC
HEMATOCRIT: 35.7 % — AB (ref 40.0–52.0)
HEMOGLOBIN: 11.8 g/dL — AB (ref 13.0–18.0)
MCH: 26 pg (ref 26.0–34.0)
MCHC: 33 g/dL (ref 32.0–36.0)
MCV: 78.8 fL — AB (ref 80.0–100.0)
Platelets: 188 10*3/uL (ref 150–440)
RBC: 4.53 MIL/uL (ref 4.40–5.90)
RDW: 17.1 % — ABNORMAL HIGH (ref 11.5–14.5)
WBC: 9.2 10*3/uL (ref 3.8–10.6)

## 2015-06-20 LAB — BASIC METABOLIC PANEL
Anion gap: 8 (ref 5–15)
BUN: 19 mg/dL (ref 6–20)
CHLORIDE: 104 mmol/L (ref 101–111)
CO2: 29 mmol/L (ref 22–32)
Calcium: 8.2 mg/dL — ABNORMAL LOW (ref 8.9–10.3)
Creatinine, Ser: 1.36 mg/dL — ABNORMAL HIGH (ref 0.61–1.24)
GFR calc non Af Amer: 49 mL/min — ABNORMAL LOW (ref >60)
GFR, EST AFRICAN AMERICAN: 57 mL/min — AB (ref >60)
Glucose, Bld: 147 mg/dL — ABNORMAL HIGH (ref 65–99)
POTASSIUM: 2.9 mmol/L — AB (ref 3.5–5.1)
SODIUM: 141 mmol/L (ref 135–145)

## 2015-06-20 LAB — LIPASE, BLOOD: Lipase: 27 U/L (ref 11–51)

## 2015-06-20 MED ORDER — ONDANSETRON HCL 4 MG/2ML IJ SOLN
4.0000 mg | Freq: Once | INTRAMUSCULAR | Status: AC
  Administered 2015-06-20: 4 mg via INTRAVENOUS

## 2015-06-20 MED ORDER — PROMETHAZINE HCL 25 MG/ML IJ SOLN
12.5000 mg | Freq: Once | INTRAMUSCULAR | Status: AC
  Administered 2015-06-20: 12.5 mg via INTRAVENOUS

## 2015-06-20 MED ORDER — METHYLPREDNISOLONE SODIUM SUCC 125 MG IJ SOLR
125.0000 mg | Freq: Once | INTRAMUSCULAR | Status: AC
  Administered 2015-06-20: 125 mg via INTRAVENOUS
  Filled 2015-06-20: qty 2

## 2015-06-20 MED ORDER — IOHEXOL 350 MG/ML SOLN
100.0000 mL | Freq: Once | INTRAVENOUS | Status: AC | PRN
  Administered 2015-06-20: 100 mL via INTRAVENOUS

## 2015-06-20 MED ORDER — ONDANSETRON HCL 4 MG/2ML IJ SOLN
INTRAMUSCULAR | Status: AC
  Administered 2015-06-20: 4 mg via INTRAVENOUS
  Filled 2015-06-20: qty 2

## 2015-06-20 MED ORDER — PROMETHAZINE HCL 25 MG/ML IJ SOLN
INTRAMUSCULAR | Status: AC
  Filled 2015-06-20: qty 1

## 2015-06-20 MED ORDER — POTASSIUM CHLORIDE 10 MEQ/100ML IV SOLN
10.0000 meq | INTRAVENOUS | Status: AC
  Administered 2015-06-20 (×3): 10 meq via INTRAVENOUS
  Filled 2015-06-20 (×4): qty 100

## 2015-06-20 MED ORDER — DIPHENHYDRAMINE HCL 50 MG/ML IJ SOLN
50.0000 mg | Freq: Once | INTRAMUSCULAR | Status: AC
  Administered 2015-06-20: 50 mg via INTRAVENOUS
  Filled 2015-06-20: qty 1

## 2015-06-20 NOTE — ED Notes (Signed)
Report given to Ed, Therapist, sports at [facility].

## 2015-06-20 NOTE — ED Notes (Signed)
Dr. Archie Balboa notified of potassium of 2.9 and troponin 0.06

## 2015-06-20 NOTE — ED Notes (Signed)
Per Dr. Archie Balboa, verbal order given: potassium chloride is to be stopped prior to transport.

## 2015-06-20 NOTE — ED Provider Notes (Signed)
[facility] Emergency Department Provider Note    ____________________________________________  Time seen: ~1610  I have reviewed the triage vital signs and the nursing notes.   HISTORY  Chief Complaint Chest Pain and Nausea   History limited by: Not Limited   HPI Peter [name] is a 75 y.o. male who presents to the emergency department today because of concerns for right lower chest pain and nausea. The patient states that it started suddenly today. He states that initially it was severe. He describes it as being located around his lower right ribs. He did have some nausea with this. He did have vomiting. He states that after the vomiting he felt somewhat better. He denies any recent fevers. The time of my examination the pain is now a 5 out of 10.     Past Medical History  Diagnosis Date  . Hypertension   . Asthma   . 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 High cholesterol   . Anginal pain ([place])   . Coronary artery disease   . Shortness of breath 12/14/2011    "@ rest, lying down, w/exertion"  . Bladder infection, acute 03/2011    "had a whole lot of bleeding from this"  . Myocardial infarction ([place]) 1996  . NSTEMI (non-ST elevated myocardial infarction) ([place]) 12/14/2011    Patient Active Problem List   Diagnosis Date Noted  . Congestive heart failure ([place]) 01/17/2015  . Current tobacco use 01/17/2015  . Hypercholesterolemia 01/17/2015  . Peripheral vascular disease ([place]) 01/17/2015  . 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 Arthritis, degenerative 07/22/2013  . H/O angina pectoris 07/22/2013  . Adiposity 07/22/2013  . Infection of the upper respiratory tract 07/22/2013  . 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 Acute myocardial infarction, subendocardial infarction, initial episode of care ([place])  12/17/2011  . Acute subendocardial infarction ([place]) 12/17/2011  . Hypokalemia 12/15/2011  . SOB (shortness of breath) 12/14/2011  . CAD (coronary artery disease) 12/14/2011  . HTN (hypertension) 12/14/2011  . Hyperlipidemia 12/14/2011  . GERD (gastroesophageal reflux disease) 12/14/2011  . BPH (benign prostatic hyperplasia) 12/14/2011  . 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 Tonsillectomy      "I was a kid"  . Hernia repair  ~ 2001    "abdominal w/mesh implanted"  . Coronary angioplasty with stent placement  1996; ~ 2003; ~ 2008    "total of 3"  . Left heart catheterization with coronary angiogram N/A 12/16/2011    Procedure: LEFT HEART CATHETERIZATION WITH CORONARY ANGIOGRAM;  Surgeon: Minus Breeding, MD;  Location[facility] CATH LAB;  Service: Cardiovascular;  Laterality: N/A;  . Percutaneous coronary stent intervention (pci-s) N/A 12/17/2011    Procedure: PERCUTANEOUS CORONARY STENT INTERVENTION (PCI-S);  Surgeon: Sherren Mocha, MD;  Location: [facility] CATH LAB;  Service: Cardiovascular;  Laterality: N/A;    Current Outpatient Rx  Name  Route  Sig  Dispense  Refill  . amLODipine-benazepril (LOTREL) 10-20 MG per capsule   Oral   Take 1 capsule by mouth daily.           Marland Kitchen aspirin 325 MG tablet   Oral   Take 325 mg by mouth as needed. Patient takes if there is any pain or burning in the chest.         .  EXPIRED: atorvastatin (LIPITOR) 40 MG tablet   Oral   Take 1 tablet (40 mg total) by mouth daily at 6 PM.   30 tablet   0   . atorvastatin (LIPITOR) 40 MG tablet      TK 1 T PO QD      1   . cetirizine (WAL-ZYR) 10 MG tablet   Oral   Take 10 mg by mouth.         . clobetasol cream (TEMOVATE) 0.05 %      APPLY TO PSORIASIS BID UNTIL SMOOTH      1   . clopidogrel (PLAVIX) 75 MG tablet      TAKE 4 TABLETS BY MOUTH ON THE FIRST DAY, THEN 1 TABLET DAILY THEREAFTER          . cyanocobalamin 1000 MCG tablet   Oral   Take 100 mcg by mouth daily.         . finasteride (PROSCAR) 5 MG tablet   Oral   Take 1 tablet (5 mg total) by mouth daily.   30 tablet   11   . Glucosamine-Chondroitin-Vit C 2000-1200-60 MG/30ML LIQD   Oral   Take by mouth.         . hydrochlorothiazide (HYDRODIURIL) 12.5 MG tablet               . isosorbide mononitrate (IMDUR) 30 MG 24 hr tablet               . Multiple Vitamins-Minerals (ICAPS PO)   Oral   Take 1 capsule by mouth daily.         Marland Kitchen omeprazole (PRILOSEC) 20 MG capsule            2   . potassium chloride SA (K-DUR,KLOR-CON) 20 MEQ tablet   Oral   Take 40 mEq by mouth 2 (two) times daily.         . Tamsulosin HCl (FLOMAX) 0.4 MG CAPS   Oral   Take 0.4 mg by mouth daily after breakfast.            Allergies Iodinated diagnostic agents  Family History  Problem Relation Age of Onset  . Family history unknown: Yes    Social History Social History  Substance Use Topics  . Smoking status: Current Every Day Smoker -- 1.00 packs/day for 53 years    Types: Cigarettes  . Smokeless tobacco: Never Used  . Alcohol Use: 0.6 oz/week    1 Cans of beer per week     Comment: 12/14/2011 "if my kidneys are sore, I drink 1 beer/day; if not sore; no beer; occasionally drink socially; ave 1 beer/wk maybe"    Review of Systems  Constitutional: Negative for fever. Cardiovascular: Positive for right lower chest pain Respiratory: Negative for shortness of breath. Gastrointestinal: Positive for right upper quadrant abdominal pain, vomiting Neurological: Negative for headaches, focal weakness or numbness.  10-point ROS otherwise negative.  ____________________________________________   PHYSICAL EXAM:  VITAL SIGNS: ED Triage Vitals  Enc Vitals Group     BP 06/20/15 1543 158/77 mmHg     Pulse Rate 06/20/15 1541 63     Resp 06/20/15 1541 12     Temp --      Temp src --      SpO2 06/20/15  1543 97 %     Weight 06/20/15 1541 222 lb (100.699 kg)     Height 06/20/15 1541 5\' 11"  (1.803 m)     Head Cir --  Peak Flow --      Pain Score 06/20/15 1541 5   Constitutional: Alert and oriented. Appears mildly uncomfortable. Eyes: Conjunctivae are normal. PERRL. Normal extraocular movements. ENT   Head: Normocephalic and atraumatic.   Nose: No congestion/rhinnorhea.   Mouth/Throat: Mucous membranes are moist.   Neck: No stridor. Hematological/Lymphatic/Immunilogical: No cervical lymphadenopathy. Cardiovascular: Normal rate, regular rhythm.  No murmurs, rubs, or gallops. Respiratory: Normal respiratory effort without tachypnea nor retractions. Breath sounds are clear and equal bilaterally. No wheezes/rales/rhonchi. Gastrointestinal: Soft and nontender. No distention.  Genitourinary: Deferred Musculoskeletal: Normal range of motion in all extremities. No joint effusions.  No lower extremity tenderness nor edema. Neurologic:  Normal speech and language. No gross focal neurologic deficits are appreciated.  Skin:  Skin is warm, dry and intact. No rash noted. Psychiatric: Mood and affect are normal. Speech and behavior are normal. Patient exhibits appropriate insight and judgment.  ____________________________________________    LABS (pertinent positives/negatives)  Labs Reviewed  BASIC METABOLIC PANEL - Abnormal; Notable for the following:    Potassium 2.9 (*)    Glucose, Bld 147 (*)    Creatinine, Ser 1.36 (*)    Calcium 8.2 (*)    GFR calc non Af Amer 49 (*)    GFR calc Af Amer 57 (*)    All other components within normal limits  CBC - Abnormal; Notable for the following:    Hemoglobin 11.8 (*)    HCT 35.7 (*)    MCV 78.8 (*)    RDW 17.1 (*)    All other components within normal limits  TROPONIN I - Abnormal; Notable for the following:    Troponin I 0.06 (*)    All other components within normal limits  HEPATIC FUNCTION PANEL - Abnormal; Notable for the  following:    Total Protein 5.9 (*)    Albumin 3.3 (*)    ALT 11 (*)    Alkaline Phosphatase 129 (*)    All other components within normal limits  URINALYSIS COMPLETEWITH MICROSCOPIC ([facility] ONLY) - Abnormal; Notable for the following:    Color, Urine YELLOW (*)    APPearance CLEAR (*)    Protein, ur 100 (*)    All other components within normal limits  TROPONIN I - Abnormal; Notable for the following:    Troponin I 0.07 (*)    All other components within normal limits  LIPASE, BLOOD     ____________________________________________   EKG  I, Nance Pear, attending physician, personally viewed and interpreted this EKG  EKG Time: 1541 Rate: 64 Rhythm: sinus rhythm Axis: left axis deviation Intervals: qtc 528 QRS: nonspecific intraventricular conduction delay, q waves V1, V2 ST changes: no st elevation Impression: abnormal ekg   ____________________________________________    RADIOLOGY  US  IMPRESSION: 1. Small amount of sludge and/or tiny stones within the gallbladder but no evidence of acute cholecystitis. 2. Mid and distal abdominal aorta are aneurysmal by measurements but with an appearance compatible with the given history of previous aortic aneurysm repair (stent or mesh?). No periaortic hemorrhage or fluid seen. This could be better characterized with CT angiogram if needed. 3. Both kidneys appear echogenic, of uncertain significance, suspicious for chronic medical renal disease. No renal stone or Hydronephrosis.  CXR  IMPRESSION: No acute cardiopulmonary disease.  CT angio IMPRESSION: 1. Stable mild aortic arch dilatation to 4 cm with mild ulcerating plaque in the aortic arch not significantly different from prior study. Recommend semi-annual imaging followup by CTA or MRA and referral to  cardiothoracic surgery if not already obtained. This recommendation follows 2010 ACCF/AHA/AATS/ACR/ASA/SCA/SCAI/SIR/STS/SVM Guidelines for the Diagnosis and  Management of Patients With Thoracic Aortic Disease. Circulation. 2010; 121: e266-e36 2. 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 This was not seen on prior CT thorax performed 12/14/2011 3. Aortic stent graft with excluded abdominal aortic aneurysm. No acute abnormalities involving the graft within the abdomen or pelvis.  ____________________________________________   PROCEDURES  Procedure(s) performed: None  Critical Care performed: No  ____________________________________________   INITIAL IMPRESSION / ASSESSMENT AND PLAN / ED COURSE  Pertinent labs & imaging results that were available during my care of the patient were reviewed by me and considered in my medical decision making (see chart for details).  Patient presented to the emergency department today because of concerns for right lower chest and right upper quadrant abdominal pain. On exam patient appeared mildly uncomfortable. Patient is followed by [facility] vascular surgery given history of thoracoabdominal aneurysm and repair. Initially the patient underwent an ultrasound here given IV contrast dye. It did show some gallstones however no cholecystitis. Given concern for thoracoabdominal aneurysm a CT angiogram was performed here after the patient had been pretreated with steroid and Benadryl. The patient tolerated the CT scan well without any immediate allergic reaction. 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 I discussed with Dr. Alecia Lemming with [facility] vascular surgery. He did not see evidence of this dissection and the most recent imaging available to him. Will plan on transferring patient to [facility] for evaluation by vascular surgery.  ----------------------------------------- 11:09 PM on 06/20/2015 -----------------------------------------  Patient is chronically hypokalemic. At this point patient has received some potassium repletion. Patient will be transported to  [facility]. To expedite this I have instructed the nurses to stop the potassium pump. I feel at this point that this is in the best interest of the patient. I feel the expedited transfer to [facility] outweighs the small risk of any cardiac arrhythmia due to the hypokalemia.  ____________________________________________   FINAL CLINICAL IMPRESSION(S) / ED DIAGNOSES  Final diagnoses:  Right flank pain  Pain  Thoracic aortic dissection (HCC)     Nance Pear, MD 06/20/15 2312

## 2015-06-20 NOTE — ED Notes (Signed)
Patient transported to Ultrasound 

## 2015-06-20 NOTE — ED Notes (Signed)
Pt arrived via EMS from home for complaints of chest pain and nausea. EMS reports NSR, 130/70, 80 HR, 94-96% RA. Pt in NAD on arrival.

## 2016-01-23 ENCOUNTER — Ambulatory Visit (INDEPENDENT_AMBULATORY_CARE_PROVIDER_SITE_OTHER): Payer: Medicare Other | Admitting: Urology

## 2016-01-23 ENCOUNTER — Encounter: Payer: Self-pay | Admitting: Urology

## 2016-01-23 VITALS — BP 153/71 | HR 84 | Ht 71.0 in | Wt 225.2 lb

## 2016-01-23 DIAGNOSIS — N400 Enlarged prostate without lower urinary tract symptoms: Secondary | ICD-10-CM

## 2016-01-23 LAB — URINALYSIS, COMPLETE
Bilirubin, UA: NEGATIVE
Glucose, UA: NEGATIVE
Ketones, UA: NEGATIVE
Leukocytes, UA: NEGATIVE
Nitrite, UA: NEGATIVE
Specific Gravity, UA: 1.02 (ref 1.005–1.030)
Urobilinogen, Ur: 0.2 mg/dL (ref 0.2–1.0)
pH, UA: 6 (ref 5.0–7.5)

## 2016-01-23 LAB — BLADDER SCAN AMB NON-IMAGING: Scan Result: 0

## 2016-01-23 LAB — MICROSCOPIC EXAMINATION: Bacteria, UA: NONE SEEN

## 2016-01-23 NOTE — Progress Notes (Signed)
01/23/2016 10:15 AM   [name] 1940/09/18 [id number]  Referring provider: Tracie Harrier, MD [address] [facility] [place], [place] [address]  Chief Complaint  Patient presents with  . Follow-up    BPH    HPI: The patient is a 75 year old gentleman with a past metal history of BPH on Flomax and finasteride presents for follow-up.    When the patient was initially seen he had a PSA of 6.2. After discussion with the patient including his comorbidities 3 heart attacks and his tobacco abuse history along with his age and decision was made to discontinue PSA screening.   The patient's IP assess score was 32 with a quality of life score of 3. He was complaining of frequency, urgency, nocturia, and incontinence. He was complaining of a weak stream.   Currently his I PSS score is 18/2. His biggest complaint is intermittency. He also complains of mild incomplete emptying, frequency, urgency, and weak stream. He has nocturia 3. However is mostly satisfied with his urination.His nocturia doesn't bother him as much and he attributes it to drinking fluid before bedtime. He is not interested in decreasing his fluid intake prior to bedtime. He is overall happy with his urinary symptoms. I did offer to increase his Flomax to help improve the more it but he is uninterested in this at this time.   The patient denies any gross hematuria or dysuria.   The patient does have a past medical history of gross hematuria which ultimately when evaluated revealed chronic inflammation.     PMH: Past Medical History:  Diagnosis Date  . Anginal pain ([place])   . Asthma   . Bladder infection, acute 03/2011   "had a whole lot of bleeding from this"  . Coronary artery disease   . High cholesterol   . Hypertension   . 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 Myocardial infarction 1996  . NSTEMI (non-ST elevated myocardial  infarction) ([place]) 12/14/2011  . Shortness of breath 12/14/2011   "@ rest, lying down, w/exertion"    Surgical History: Past Surgical History:  Procedure Laterality Date  . [place]; ~ 2003; ~ 2008   "total of 3"  . HERNIA REPAIR  ~ 2001   "abdominal w/mesh implanted"  . LEFT HEART CATHETERIZATION WITH CORONARY ANGIOGRAM N/A 12/16/2011   Procedure: LEFT HEART CATHETERIZATION WITH CORONARY ANGIOGRAM;  Surgeon: Minus Breeding, MD;  Location: [facility] CATH LAB;  Service: Cardiovascular;  Laterality: N/A;  . PERCUTANEOUS CORONARY STENT INTERVENTION (PCI-S) N/A 12/17/2011   Procedure: PERCUTANEOUS CORONARY STENT INTERVENTION (PCI-S);  Surgeon: Sherren Mocha, MD;  Location: [facility] CATH LAB;  Service: Cardiovascular;  Laterality: N/A;  . TONSILLECTOMY     "I was a kid"    Home Medications:    Medication List       Accurate as of 01/23/16 10:15 AM. Always use your most recent med list.          amLODipine-benazepril 10-40 MG capsule Commonly known as:  LOTREL TAKE 1 CAPSULE BY MOUTH EVERY DAY   aspirin 325 MG tablet Take 325 mg by mouth 2 (two) times daily. Patient takes if there is any pain or burning in the chest.   atorvastatin 40 MG tablet Commonly known as:  LIPITOR Take 1 tablet (40 mg total) by mouth daily at 6 PM.   clobetasol cream 0.05 % Commonly known as:  TEMOVATE APPLY  TO PSORIASIS BID PRN UNTIL SMOOTH   clopidogrel 75 MG tablet Commonly known as:  PLAVIX TAKE 1 TABLET(75 MG) BY MOUTH EVERY DAY   cyanocobalamin 1000 MCG tablet Take 100 mcg by mouth daily.   finasteride 5 MG tablet Commonly known as:  PROSCAR Take 1 tablet (5 mg total) by mouth daily.   GLUCOSAMINE-CHONDROITIN-VIT C PO Take 1 tablet by mouth daily.   hydrochlorothiazide 12.5 MG tablet Commonly known as:  HYDRODIURIL Take 12.5 mg by mouth daily.   hydrochlorothiazide 12.5 MG tablet Commonly known as:  HYDRODIURIL TAKE 1 TABLET BY MOUTH EVERY DAY   ICAPS  PO Take 1 capsule by mouth daily.   isosorbide mononitrate 30 MG 24 hr tablet Commonly known as:  IMDUR Take by mouth.   omeprazole 20 MG capsule Commonly known as:  PRILOSEC Take by mouth.   potassium chloride SA 20 MEQ tablet Commonly known as:  K-DUR,KLOR-CON TAKE 2 TABLETS BY MOUTH TWICE DAILY   tamsulosin 0.4 MG Caps capsule Commonly known as:  FLOMAX TAKE 1 CAPSULE BY MOUTH EVERY DAY   WAL-ZYR 10 MG tablet Generic drug:  cetirizine Take 10 mg by mouth.       Allergies:  Allergies  Allergen Reactions  . Iodinated Diagnostic Agents Shortness Of Breath  . Iodinated Glycerol  [Glycerol, Iodinated] Shortness Of Breath    Family History: Family History  Problem Relation Age of Onset  . Family history unknown: Yes    Social History:  reports that he has been smoking Cigarettes.  He has a 53.00 pack-year smoking history. He has never used smokeless tobacco. He reports that he drinks about 0.6 oz of alcohol per week . He reports that he does not use drugs.  ROS: UROLOGY Frequent Urination?: No Hard to postpone urination?: Yes Burning/pain with urination?: No Get up at night to urinate?: Yes Leakage of urine?: Yes Urine stream starts and stops?: No Trouble starting stream?: No Do you have to strain to urinate?: No Blood in urine?: No Urinary tract infection?: No Sexually transmitted disease?: No Injury to kidneys or bladder?: No Painful intercourse?: No Weak stream?: No Erection problems?: No Penile pain?: No  Gastrointestinal Nausea?: No Vomiting?: No Indigestion/heartburn?: No Diarrhea?: No Constipation?: No  Constitutional Fever: No Night sweats?: No Weight loss?: No Fatigue?: No  Skin Skin rash/lesions?: No Itching?: No  Eyes Blurred vision?: No Double vision?: No  Ears/Nose/Throat Sore throat?: No Sinus problems?: No  Hematologic/Lymphatic Swollen glands?: No Easy bruising?: Yes  Cardiovascular Leg swelling?: No Chest pain?:  No  Respiratory Cough?: No Shortness of breath?: No  Endocrine Excessive thirst?: No  Musculoskeletal Back pain?: No Joint pain?: No  Neurological Headaches?: No Dizziness?: No  Psychologic Depression?: No Anxiety?: No  Physical Exam: BP (!) 153/71   Pulse 84   Ht 5\' 11"  (1.803 m)   Wt 225 lb 3.2 oz (102.2 kg)   BMI 31.41 kg/m   Constitutional:  Alert and oriented, No acute distress. HEENT: [place] AT, moist mucus membranes.  Trachea midline, no masses. Cardiovascular: No clubbing, cyanosis, or edema. Respiratory: Normal respiratory effort, no increased work of breathing. GI: Abdomen is soft, nontender, nondistended, no abdominal masses GU: No CVA tenderness.  Skin: No rashes, bruises or suspicious lesions. Lymph: No cervical or inguinal adenopathy. Neurologic: Grossly intact, no focal deficits, moving all 4 extremities. Psychiatric: Normal mood and affect.  Laboratory Data: Lab Results  Component Value Date   WBC 9.2 06/20/2015   HGB 11.8 (L) 06/20/2015   HCT 35.7 (  L) 06/20/2015   MCV 78.8 (L) 06/20/2015   PLT 188 06/20/2015    Lab Results  Component Value Date   CREATININE 1.36 (H) 06/20/2015    No results found for: PSA  No results found for: TESTOSTERONE  No results found for: HGBA1C  Urinalysis    Component Value Date/Time   COLORURINE YELLOW (A) 06/20/2015 1626   APPEARANCEUR CLEAR (A) 06/20/2015 1626   APPEARANCEUR Clear 04/23/2015 1114   LABSPEC 1.011 06/20/2015 1626   PHURINE 7.0 06/20/2015 1626   GLUCOSEU NEGATIVE 06/20/2015 1626   HGBUR NEGATIVE 06/20/2015 1626   BILIRUBINUR NEGATIVE 06/20/2015 1626   BILIRUBINUR Negative 04/23/2015 1114   KETONESUR NEGATIVE 06/20/2015 1626   PROTEINUR 100 (A) 06/20/2015 1626   UROBILINOGEN 0.2 04/06/2011 0900   NITRITE NEGATIVE 06/20/2015 1626   LEUKOCYTESUR NEGATIVE 06/20/2015 1626   LEUKOCYTESUR Negative 04/23/2015 1114    Assessment & Plan:    1. BPH Continue Flomax and finasteride  Return  in about 1 year (around 01/22/2017).  Nickie Retort, MD  [facility] [address], [place], [place] [address] [phone]

## 2016-01-29 ENCOUNTER — Ambulatory Visit
Admission: RE | Admit: 2016-01-29 | Discharge: 2016-01-29 | Disposition: A | Discharge status: Home / Self Care | Payer: Medicare Other | Source: Ambulatory Visit | Attending: Internal Medicine | Admitting: Internal Medicine

## 2016-01-29 ENCOUNTER — Other Ambulatory Visit: Payer: Self-pay | Admitting: Internal Medicine

## 2016-01-29 DIAGNOSIS — K4020 Bilateral inguinal hernia, without obstruction or gangrene, not specified as recurrent: Secondary | ICD-10-CM | POA: Insufficient documentation

## 2016-01-29 DIAGNOSIS — I714 Abdominal aortic aneurysm, without rupture: Secondary | ICD-10-CM | POA: Insufficient documentation

## 2016-01-29 DIAGNOSIS — K8020 Calculus of gallbladder without cholecystitis without obstruction: Secondary | ICD-10-CM | POA: Insufficient documentation

## 2016-01-29 DIAGNOSIS — K5730 Diverticulosis of large intestine without perforation or abscess without bleeding: Secondary | ICD-10-CM | POA: Insufficient documentation

## 2016-01-29 DIAGNOSIS — R590 Localized enlarged lymph nodes: Secondary | ICD-10-CM | POA: Diagnosis not present

## 2016-01-29 DIAGNOSIS — I739 Peripheral vascular disease, unspecified: Secondary | ICD-10-CM

## 2016-01-29 DIAGNOSIS — R1032 Left lower quadrant pain: Secondary | ICD-10-CM | POA: Insufficient documentation

## 2016-01-29 DIAGNOSIS — R531 Weakness: Secondary | ICD-10-CM | POA: Diagnosis not present

## 2016-03-03 ENCOUNTER — Other Ambulatory Visit: Payer: Self-pay

## 2016-03-03 DIAGNOSIS — N401 Enlarged prostate with lower urinary tract symptoms: Secondary | ICD-10-CM

## 2016-03-03 MED ORDER — FINASTERIDE 5 MG PO TABS: 5.0000 mg | ORAL_TABLET | Freq: Every day | ORAL | 11 refills | Status: DC

## 2016-05-26 DIAGNOSIS — I2510 Atherosclerotic heart disease of native coronary artery without angina pectoris: Secondary | ICD-10-CM | POA: Diagnosis not present

## 2016-05-26 DIAGNOSIS — Z9889 Other specified postprocedural states: Secondary | ICD-10-CM | POA: Diagnosis not present

## 2016-05-26 DIAGNOSIS — I10 Essential (primary) hypertension: Secondary | ICD-10-CM | POA: Diagnosis not present

## 2016-05-26 DIAGNOSIS — Z955 Presence of coronary angioplasty implant and graft: Secondary | ICD-10-CM | POA: Diagnosis not present

## 2016-05-26 DIAGNOSIS — E785 Hyperlipidemia, unspecified: Secondary | ICD-10-CM | POA: Diagnosis not present

## 2016-05-26 DIAGNOSIS — J449 Chronic obstructive pulmonary disease, unspecified: Secondary | ICD-10-CM | POA: Diagnosis not present

## 2016-05-26 DIAGNOSIS — Z8679 Personal history of other diseases of the circulatory system: Secondary | ICD-10-CM | POA: Diagnosis not present

## 2016-05-26 DIAGNOSIS — F17200 Nicotine dependence, unspecified, uncomplicated: Secondary | ICD-10-CM | POA: Diagnosis not present

## 2016-05-26 DIAGNOSIS — E669 Obesity, unspecified: Secondary | ICD-10-CM | POA: Diagnosis not present

## 2016-05-26 DIAGNOSIS — I209 Angina pectoris, unspecified: Secondary | ICD-10-CM | POA: Diagnosis not present

## 2016-05-26 DIAGNOSIS — I739 Peripheral vascular disease, unspecified: Secondary | ICD-10-CM | POA: Diagnosis not present

## 2016-05-28 DIAGNOSIS — D539 Nutritional anemia, unspecified: Secondary | ICD-10-CM | POA: Diagnosis not present

## 2016-05-28 DIAGNOSIS — I10 Essential (primary) hypertension: Secondary | ICD-10-CM | POA: Diagnosis not present

## 2016-05-28 DIAGNOSIS — G5792 Unspecified mononeuropathy of left lower limb: Secondary | ICD-10-CM | POA: Diagnosis not present

## 2016-05-28 DIAGNOSIS — I2510 Atherosclerotic heart disease of native coronary artery without angina pectoris: Secondary | ICD-10-CM | POA: Diagnosis not present

## 2016-05-28 DIAGNOSIS — Z23 Encounter for immunization: Secondary | ICD-10-CM | POA: Diagnosis not present

## 2016-05-28 DIAGNOSIS — I739 Peripheral vascular disease, unspecified: Secondary | ICD-10-CM | POA: Diagnosis not present

## 2016-05-28 DIAGNOSIS — Z0000 Encounter for general adult medical examination without abnormal findings: Secondary | ICD-10-CM | POA: Diagnosis not present

## 2016-05-28 DIAGNOSIS — I712 Thoracic aortic aneurysm, without rupture: Secondary | ICD-10-CM | POA: Diagnosis not present

## 2016-05-28 DIAGNOSIS — R1032 Left lower quadrant pain: Secondary | ICD-10-CM | POA: Diagnosis not present

## 2016-05-28 DIAGNOSIS — E7800 Pure hypercholesterolemia, unspecified: Secondary | ICD-10-CM | POA: Diagnosis not present

## 2016-05-28 DIAGNOSIS — D649 Anemia, unspecified: Secondary | ICD-10-CM | POA: Diagnosis not present

## 2016-06-03 DIAGNOSIS — R208 Other disturbances of skin sensation: Secondary | ICD-10-CM | POA: Diagnosis not present

## 2016-06-03 DIAGNOSIS — L538 Other specified erythematous conditions: Secondary | ICD-10-CM | POA: Diagnosis not present

## 2016-06-03 DIAGNOSIS — X32XXXA Exposure to sunlight, initial encounter: Secondary | ICD-10-CM | POA: Diagnosis not present

## 2016-06-03 DIAGNOSIS — D225 Melanocytic nevi of trunk: Secondary | ICD-10-CM | POA: Diagnosis not present

## 2016-06-03 DIAGNOSIS — L820 Inflamed seborrheic keratosis: Secondary | ICD-10-CM | POA: Diagnosis not present

## 2016-06-03 DIAGNOSIS — L570 Actinic keratosis: Secondary | ICD-10-CM | POA: Diagnosis not present

## 2016-06-03 DIAGNOSIS — Z85820 Personal history of malignant melanoma of skin: Secondary | ICD-10-CM | POA: Diagnosis not present

## 2016-06-03 DIAGNOSIS — D2261 Melanocytic nevi of right upper limb, including shoulder: Secondary | ICD-10-CM | POA: Diagnosis not present

## 2016-06-03 DIAGNOSIS — Z85828 Personal history of other malignant neoplasm of skin: Secondary | ICD-10-CM | POA: Diagnosis not present

## 2016-06-04 DIAGNOSIS — Z720 Tobacco use: Secondary | ICD-10-CM | POA: Diagnosis not present

## 2016-06-04 DIAGNOSIS — Z23 Encounter for immunization: Secondary | ICD-10-CM | POA: Diagnosis not present

## 2016-06-04 DIAGNOSIS — M150 Primary generalized (osteo)arthritis: Secondary | ICD-10-CM | POA: Diagnosis not present

## 2016-06-04 DIAGNOSIS — D649 Anemia, unspecified: Secondary | ICD-10-CM | POA: Diagnosis not present

## 2016-06-04 DIAGNOSIS — R7989 Other specified abnormal findings of blood chemistry: Secondary | ICD-10-CM | POA: Diagnosis not present

## 2016-06-04 DIAGNOSIS — I10 Essential (primary) hypertension: Secondary | ICD-10-CM | POA: Diagnosis not present

## 2016-06-04 DIAGNOSIS — I712 Thoracic aortic aneurysm, without rupture: Secondary | ICD-10-CM | POA: Diagnosis not present

## 2016-06-11 DIAGNOSIS — D649 Anemia, unspecified: Secondary | ICD-10-CM | POA: Diagnosis not present

## 2016-06-11 DIAGNOSIS — I10 Essential (primary) hypertension: Secondary | ICD-10-CM | POA: Diagnosis not present

## 2016-06-11 DIAGNOSIS — Z720 Tobacco use: Secondary | ICD-10-CM | POA: Diagnosis not present

## 2016-06-11 DIAGNOSIS — M150 Primary generalized (osteo)arthritis: Secondary | ICD-10-CM | POA: Diagnosis not present

## 2016-06-11 DIAGNOSIS — R7989 Other specified abnormal findings of blood chemistry: Secondary | ICD-10-CM | POA: Diagnosis not present

## 2016-06-11 DIAGNOSIS — I712 Thoracic aortic aneurysm, without rupture: Secondary | ICD-10-CM | POA: Diagnosis not present

## 2016-06-11 DIAGNOSIS — Z23 Encounter for immunization: Secondary | ICD-10-CM | POA: Diagnosis not present

## 2016-06-27 DIAGNOSIS — J111 Influenza due to unidentified influenza virus with other respiratory manifestations: Secondary | ICD-10-CM | POA: Diagnosis not present

## 2016-07-13 DIAGNOSIS — H353232 Exudative age-related macular degeneration, bilateral, with inactive choroidal neovascularization: Secondary | ICD-10-CM | POA: Diagnosis not present

## 2016-08-07 ENCOUNTER — Emergency Department: Payer: PPO

## 2016-08-07 ENCOUNTER — Inpatient Hospital Stay
Admission: EM | Admit: 2016-08-07 | Discharge: 2016-08-11 | DRG: 246 | Disposition: A | Discharge status: Home / Self Care | Payer: PPO | Attending: Internal Medicine | Admitting: Internal Medicine

## 2016-08-07 ENCOUNTER — Encounter: Payer: Self-pay | Admitting: Emergency Medicine

## 2016-08-07 DIAGNOSIS — I2510 Atherosclerotic heart disease of native coronary artery without angina pectoris: Secondary | ICD-10-CM | POA: Diagnosis present

## 2016-08-07 DIAGNOSIS — I10 Essential (primary) hypertension: Secondary | ICD-10-CM | POA: Diagnosis not present

## 2016-08-07 DIAGNOSIS — T82855A Stenosis of coronary artery stent, initial encounter: Secondary | ICD-10-CM | POA: Diagnosis present

## 2016-08-07 DIAGNOSIS — I5021 Acute systolic (congestive) heart failure: Secondary | ICD-10-CM | POA: Diagnosis not present

## 2016-08-07 DIAGNOSIS — I5031 Acute diastolic (congestive) heart failure: Secondary | ICD-10-CM | POA: Diagnosis not present

## 2016-08-07 DIAGNOSIS — N183 Chronic kidney disease, stage 3 (moderate): Secondary | ICD-10-CM | POA: Diagnosis present

## 2016-08-07 DIAGNOSIS — H3530 Unspecified macular degeneration: Secondary | ICD-10-CM | POA: Diagnosis present

## 2016-08-07 DIAGNOSIS — I110 Hypertensive heart disease with heart failure: Secondary | ICD-10-CM | POA: Diagnosis not present

## 2016-08-07 DIAGNOSIS — I509 Heart failure, unspecified: Secondary | ICD-10-CM

## 2016-08-07 DIAGNOSIS — R0602 Shortness of breath: Secondary | ICD-10-CM | POA: Diagnosis not present

## 2016-08-07 DIAGNOSIS — I25119 Atherosclerotic heart disease of native coronary artery with unspecified angina pectoris: Secondary | ICD-10-CM | POA: Diagnosis not present

## 2016-08-07 DIAGNOSIS — Z7902 Long term (current) use of antithrombotics/antiplatelets: Secondary | ICD-10-CM | POA: Diagnosis not present

## 2016-08-07 DIAGNOSIS — I252 Old myocardial infarction: Secondary | ICD-10-CM | POA: Diagnosis not present

## 2016-08-07 DIAGNOSIS — Y831 Surgical operation with implant of artificial internal device as the cause of abnormal reaction of the patient, or of later complication, without mention of misadventure at the time of the procedure: Secondary | ICD-10-CM | POA: Diagnosis present

## 2016-08-07 DIAGNOSIS — R748 Abnormal levels of other serum enzymes: Secondary | ICD-10-CM | POA: Diagnosis not present

## 2016-08-07 DIAGNOSIS — E785 Hyperlipidemia, unspecified: Secondary | ICD-10-CM | POA: Diagnosis not present

## 2016-08-07 DIAGNOSIS — E7800 Pure hypercholesterolemia, unspecified: Secondary | ICD-10-CM | POA: Diagnosis not present

## 2016-08-07 DIAGNOSIS — J45909 Unspecified asthma, uncomplicated: Secondary | ICD-10-CM | POA: Diagnosis present

## 2016-08-07 DIAGNOSIS — Z955 Presence of coronary angioplasty implant and graft: Secondary | ICD-10-CM

## 2016-08-07 DIAGNOSIS — I447 Left bundle-branch block, unspecified: Secondary | ICD-10-CM | POA: Diagnosis not present

## 2016-08-07 DIAGNOSIS — I4891 Unspecified atrial fibrillation: Secondary | ICD-10-CM | POA: Diagnosis present

## 2016-08-07 DIAGNOSIS — I739 Peripheral vascular disease, unspecified: Secondary | ICD-10-CM | POA: Diagnosis not present

## 2016-08-07 DIAGNOSIS — R0603 Acute respiratory distress: Secondary | ICD-10-CM | POA: Diagnosis present

## 2016-08-07 DIAGNOSIS — Z79899 Other long term (current) drug therapy: Secondary | ICD-10-CM | POA: Diagnosis not present

## 2016-08-07 DIAGNOSIS — Z7982 Long term (current) use of aspirin: Secondary | ICD-10-CM | POA: Diagnosis not present

## 2016-08-07 DIAGNOSIS — I5023 Acute on chronic systolic (congestive) heart failure: Secondary | ICD-10-CM | POA: Diagnosis present

## 2016-08-07 DIAGNOSIS — F17210 Nicotine dependence, cigarettes, uncomplicated: Secondary | ICD-10-CM | POA: Diagnosis not present

## 2016-08-07 DIAGNOSIS — I130 Hypertensive heart and chronic kidney disease with heart failure and stage 1 through stage 4 chronic kidney disease, or unspecified chronic kidney disease: Secondary | ICD-10-CM | POA: Diagnosis not present

## 2016-08-07 LAB — CBC WITH DIFFERENTIAL/PLATELET
Basophils Absolute: 0.1 10*3/uL (ref 0–0.1)
Basophils Relative: 1 %
EOS ABS: 0.1 10*3/uL (ref 0–0.7)
Eosinophils Relative: 1 %
HCT: 35.5 % — ABNORMAL LOW (ref 40.0–52.0)
Hemoglobin: 11.6 g/dL — ABNORMAL LOW (ref 13.0–18.0)
LYMPHS ABS: 1.7 10*3/uL (ref 1.0–3.6)
LYMPHS PCT: 21 %
MCH: 26.2 pg (ref 26.0–34.0)
MCHC: 32.6 g/dL (ref 32.0–36.0)
MCV: 80.3 fL (ref 80.0–100.0)
MONOS PCT: 11 %
Monocytes Absolute: 0.9 10*3/uL (ref 0.2–1.0)
Neutro Abs: 5.3 10*3/uL (ref 1.4–6.5)
Neutrophils Relative %: 66 %
PLATELETS: 211 10*3/uL (ref 150–440)
RBC: 4.42 MIL/uL (ref 4.40–5.90)
RDW: 15.3 % — ABNORMAL HIGH (ref 11.5–14.5)
WBC: 8 10*3/uL (ref 3.8–10.6)

## 2016-08-07 LAB — BASIC METABOLIC PANEL
Anion gap: 7 (ref 5–15)
BUN: 20 mg/dL (ref 6–20)
CHLORIDE: 103 mmol/L (ref 101–111)
CO2: 28 mmol/L (ref 22–32)
CREATININE: 1.45 mg/dL — AB (ref 0.61–1.24)
Calcium: 8.7 mg/dL — ABNORMAL LOW (ref 8.9–10.3)
GFR calc Af Amer: 52 mL/min — ABNORMAL LOW (ref >60)
GFR, EST NON AFRICAN AMERICAN: 45 mL/min — AB (ref >60)
GLUCOSE: 105 mg/dL — AB (ref 65–99)
Potassium: 3.8 mmol/L (ref 3.5–5.1)
SODIUM: 138 mmol/L (ref 135–145)

## 2016-08-07 LAB — TROPONIN I
Troponin I: 0.08 ng/mL (ref <0.03)
Troponin I: 0.07 ng/mL (ref <0.03)

## 2016-08-07 LAB — BRAIN NATRIURETIC PEPTIDE: B NATRIURETIC PEPTIDE 5: 474 pg/mL — AB (ref 0.0–100.0)

## 2016-08-07 MED ORDER — SODIUM CHLORIDE 0.9% FLUSH: 3.0000 mL | INTRAVENOUS | Status: DC | PRN

## 2016-08-07 MED ORDER — AMLODIPINE BESY-BENAZEPRIL HCL 10-40 MG PO CAPS: 1.0000 | ORAL_CAPSULE | Freq: Every day | ORAL | Status: DC

## 2016-08-07 MED ORDER — CLOPIDOGREL BISULFATE 75 MG PO TABS
75.0000 mg | ORAL_TABLET | Freq: Every day | ORAL | Status: DC
  Administered 2016-08-08 – 2016-08-11 (×3): 75 mg via ORAL
  Filled 2016-08-07 (×3): qty 1

## 2016-08-07 MED ORDER — PANTOPRAZOLE SODIUM 40 MG PO TBEC
40.0000 mg | DELAYED_RELEASE_TABLET | Freq: Every day | ORAL | Status: DC
  Administered 2016-08-08 – 2016-08-11 (×3): 40 mg via ORAL
  Filled 2016-08-07 (×3): qty 1

## 2016-08-07 MED ORDER — ISOSORBIDE MONONITRATE ER 30 MG PO TB24
30.0000 mg | ORAL_TABLET | Freq: Every day | ORAL | Status: DC
Start: 2016-08-08 — End: 2016-08-11
  Administered 2016-08-08 – 2016-08-11 (×3): 30 mg via ORAL
  Filled 2016-08-07 (×3): qty 1

## 2016-08-07 MED ORDER — AMLODIPINE BESYLATE 10 MG PO TABS
10.0000 mg | ORAL_TABLET | Freq: Every day | ORAL | Status: DC
  Administered 2016-08-08 – 2016-08-11 (×3): 10 mg via ORAL
  Filled 2016-08-07 (×3): qty 1

## 2016-08-07 MED ORDER — BENAZEPRIL HCL 20 MG PO TABS: 40.0000 mg | ORAL_TABLET | Freq: Every day | ORAL | Status: DC

## 2016-08-07 MED ORDER — FINASTERIDE 5 MG PO TABS
5.0000 mg | ORAL_TABLET | Freq: Every day | ORAL | Status: DC
Start: 2016-08-08 — End: 2016-08-11
  Administered 2016-08-08 – 2016-08-11 (×3): 5 mg via ORAL
  Filled 2016-08-07 (×3): qty 1

## 2016-08-07 MED ORDER — SODIUM CHLORIDE 0.9 % IV SOLN: 250.0000 mL | INTRAVENOUS | Status: DC | PRN

## 2016-08-07 MED ORDER — FUROSEMIDE 10 MG/ML IJ SOLN
40.0000 mg | Freq: Once | INTRAMUSCULAR | Status: AC
  Administered 2016-08-07: 40 mg via INTRAVENOUS
  Filled 2016-08-07: qty 4

## 2016-08-07 MED ORDER — SODIUM CHLORIDE 0.9% FLUSH
3.0000 mL | Freq: Two times a day (BID) | INTRAVENOUS | Status: DC
  Administered 2016-08-07 – 2016-08-09 (×5): 3 mL via INTRAVENOUS

## 2016-08-07 MED ORDER — ENOXAPARIN SODIUM 40 MG/0.4ML ~~LOC~~ SOLN
40.0000 mg | SUBCUTANEOUS | Status: DC
  Administered 2016-08-07 – 2016-08-10 (×4): 40 mg via SUBCUTANEOUS
  Filled 2016-08-07 (×4): qty 0.4

## 2016-08-07 MED ORDER — GLUCOSAMINE-CHONDROITIN-VIT C 2000-1200-60 MG/30ML PO LIQD: Freq: Every day | ORAL | Status: DC

## 2016-08-07 MED ORDER — ALBUTEROL SULFATE (2.5 MG/3ML) 0.083% IN NEBU
5.0000 mg | INHALATION_SOLUTION | Freq: Once | RESPIRATORY_TRACT | Status: AC
  Administered 2016-08-07: 5 mg via RESPIRATORY_TRACT
  Filled 2016-08-07: qty 6

## 2016-08-07 MED ORDER — ACETAMINOPHEN 325 MG PO TABS: 650.0000 mg | ORAL_TABLET | ORAL | Status: DC | PRN

## 2016-08-07 MED ORDER — LORATADINE 10 MG PO TABS
10.0000 mg | ORAL_TABLET | Freq: Every day | ORAL | Status: DC
  Administered 2016-08-08 – 2016-08-11 (×3): 10 mg via ORAL
  Filled 2016-08-07 (×3): qty 1

## 2016-08-07 MED ORDER — OCUVITE-LUTEIN PO CAPS
1.0000 | ORAL_CAPSULE | Freq: Two times a day (BID) | ORAL | Status: DC
  Administered 2016-08-07 – 2016-08-11 (×7): 1 via ORAL
  Filled 2016-08-07 (×7): qty 1

## 2016-08-07 MED ORDER — ATORVASTATIN CALCIUM 20 MG PO TABS
40.0000 mg | ORAL_TABLET | Freq: Every day | ORAL | Status: DC
  Administered 2016-08-08 – 2016-08-10 (×3): 40 mg via ORAL
  Filled 2016-08-07 (×3): qty 2

## 2016-08-07 MED ORDER — ASPIRIN 325 MG PO TABS
325.0000 mg | ORAL_TABLET | Freq: Two times a day (BID) | ORAL | Status: DC
  Administered 2016-08-08 – 2016-08-11 (×6): 325 mg via ORAL
  Filled 2016-08-07 (×6): qty 1

## 2016-08-07 MED ORDER — AMLODIPINE BESYLATE 10 MG PO TABS: 10.0000 mg | ORAL_TABLET | Freq: Every day | ORAL | Status: DC

## 2016-08-07 MED ORDER — ONDANSETRON HCL 4 MG/2ML IJ SOLN: 4.0000 mg | Freq: Four times a day (QID) | INTRAMUSCULAR | Status: DC | PRN

## 2016-08-07 MED ORDER — TAMSULOSIN HCL 0.4 MG PO CAPS
0.4000 mg | ORAL_CAPSULE | Freq: Every day | ORAL | Status: DC
  Administered 2016-08-08 – 2016-08-11 (×3): 0.4 mg via ORAL
  Filled 2016-08-07 (×3): qty 1

## 2016-08-07 MED ORDER — BENAZEPRIL HCL 20 MG PO TABS
40.0000 mg | ORAL_TABLET | Freq: Every day | ORAL | Status: DC
  Administered 2016-08-08 – 2016-08-11 (×3): 40 mg via ORAL
  Filled 2016-08-07 (×3): qty 2

## 2016-08-07 MED ORDER — ASPIRIN 81 MG PO CHEW
324.0000 mg | CHEWABLE_TABLET | Freq: Once | ORAL | Status: AC
  Administered 2016-08-07: 324 mg via ORAL
  Filled 2016-08-07: qty 4

## 2016-08-07 MED ORDER — VITAMIN B-12 100 MCG PO TABS
100.0000 ug | ORAL_TABLET | Freq: Every day | ORAL | Status: DC
  Administered 2016-08-08 – 2016-08-11 (×3): 100 ug via ORAL
  Filled 2016-08-07 (×3): qty 1

## 2016-08-07 MED ORDER — FUROSEMIDE 10 MG/ML IJ SOLN
20.0000 mg | Freq: Two times a day (BID) | INTRAMUSCULAR | Status: DC
  Administered 2016-08-07 – 2016-08-08 (×2): 20 mg via INTRAVENOUS
  Filled 2016-08-07 (×2): qty 2

## 2016-08-07 MED ORDER — POTASSIUM CHLORIDE CRYS ER 20 MEQ PO TBCR
40.0000 meq | EXTENDED_RELEASE_TABLET | Freq: Two times a day (BID) | ORAL | Status: DC
  Administered 2016-08-07 – 2016-08-11 (×7): 40 meq via ORAL
  Filled 2016-08-07 (×7): qty 2

## 2016-08-07 NOTE — H&P (Signed)
[facility] [place] NAME: Peter [name]    MR#:  [id number]  DATE OF BIRTH:  [birth year]  DATE OF ADMISSION:  08/07/2016  PRIMARY CARE PHYSICIAN: Tracie Harrier, MD   REQUESTING/REFERRING PHYSICIAN:   CHIEF COMPLAINT:   Shortness of breath HISTORY OF PRESENT ILLNESS:  Peter [name]  is a 76 y.o. male with a known history of Chronic congestive heart failure, coronary artery disease, hypertension, hyperlipidemia, macular degeneration and other medical problems sees Dr. Clayborn Bigness as an outpatient is presenting to the ED with a chief complaint of shortness of breath and worsening of his lower extremity swelling. Chest x-ray has revealed interstitial edema. EKG with new left bundle branch block. Troponin at 0.08 which seems to be his baseline as his previous troponin was at 0.07 on 06/20/2015. Patient denies any chest pain. ED physician has discussed with cardiology Dr. Lorinda Creed who has recommended no interventions for the new EKG changes at this point of time  PAST MEDICAL HISTORY:   Past Medical History:  Diagnosis Date  . Anginal pain ([place])   . Asthma   . Bladder infection, acute 03/2011   "had a whole lot of bleeding from this"  . Coronary artery disease   . High cholesterol   . Hypertension   . 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 Myocardial infarction ([place]) 1996  . NSTEMI (non-ST elevated myocardial infarction) ([place]) 12/14/2011  . Shortness of breath 12/14/2011   "@ rest, lying down, w/exertion"    PAST SURGICAL HISTOIRY:   Past Surgical History:  Procedure Laterality Date  . [place]; ~ 2003; ~ 2008   "total of 3"  . HERNIA REPAIR  ~ 2001   "abdominal w/mesh implanted"  . LEFT HEART CATHETERIZATION WITH CORONARY ANGIOGRAM N/A 12/16/2011   Procedure: LEFT HEART CATHETERIZATION WITH CORONARY ANGIOGRAM;  Surgeon: Minus Breeding, MD;   Location: [facility] CATH LAB;  Service: Cardiovascular;  Laterality: N/A;  . PERCUTANEOUS CORONARY STENT INTERVENTION (PCI-S) N/A 12/17/2011   Procedure: PERCUTANEOUS CORONARY STENT INTERVENTION (PCI-S);  Surgeon: Sherren Mocha, MD;  Location: [facility] CATH LAB;  Service: Cardiovascular;  Laterality: N/A;  . TONSILLECTOMY     "I was a kid"    SOCIAL HISTORY:   Social History  Substance Use Topics  . Smoking status: Current Every Day Smoker    Packs/day: 1.00    Years: 53.00    Types: Cigarettes  . Smokeless tobacco: Never Used  . Alcohol use 0.6 oz/week    1 Cans of beer per week     Comment: 12/14/2011 "if my kidneys are sore, I drink 1 beer/day; if not sore; no beer; occasionally drink socially; ave 1 beer/wk maybe"    FAMILY HISTORY:   Family History  Problem Relation Age of Onset  . Family history unknown: Yes    DRUG ALLERGIES:   Allergies  Allergen Reactions  . Iodinated Diagnostic Agents Shortness Of Breath  . Iodinated Glycerol  [Glycerol, Iodinated] Shortness Of Breath    REVIEW OF SYSTEMS:  CONSTITUTIONAL: No fever, fatigue or weakness.  EYES: No blurred or double vision.  EARS, NOSE, AND THROAT: No tinnitus or ear pain.  RESPIRATORY: No cough, Patient is reporting shortness of breath, orthopnea but denies wheezing or hemoptysis.  CARDIOVASCULAR: No chest pain, orthopnea, edema.  GASTROINTESTINAL: No nausea, vomiting, diarrhea or abdominal pain.  GENITOURINARY: No dysuria, hematuria.  ENDOCRINE: No polyuria, nocturia,  HEMATOLOGY: No anemia, easy bruising or bleeding SKIN: No rash or lesion. MUSCULOSKELETAL:Chronic pedal edema No joint pain or arthritis.   NEUROLOGIC: No tingling, numbness, weakness.  PSYCHIATRY: No anxiety or depression.   MEDICATIONS AT HOME:   Prior to Admission medications   Medication Sig Start Date End Date Taking? Authorizing Provider  amLODipine-benazepril (LOTREL) 10-40 MG capsule TAKE 1 CAPSULE BY MOUTH EVERY DAY 10/21/15  Yes Historical  Provider, MD  aspirin 325 MG tablet Take 325 mg by mouth 2 (two) times daily. Patient takes if there is any pain or burning in the chest.   Yes Historical Provider, MD  atorvastatin (LIPITOR) 40 MG tablet Take 1 tablet (40 mg total) by mouth daily at 6 PM. 12/18/11  Yes Annita Brod, MD  cetirizine (WAL-ZYR) 10 MG tablet Take 10 mg by mouth.   Yes Historical Provider, MD  clobetasol cream (TEMOVATE) 0.05 % APPLY TO PSORIASIS BID PRN UNTIL SMOOTH 11/05/14  Yes Historical Provider, MD  clopidogrel (PLAVIX) 75 MG tablet TAKE 1 TABLET(75 MG) BY MOUTH EVERY DAY 01/21/16  Yes Historical Provider, MD  cyanocobalamin 1000 MCG tablet Take 100 mcg by mouth daily.   Yes Historical Provider, MD  finasteride (PROSCAR) 5 MG tablet Take 1 tablet (5 mg total) by mouth daily. 03/03/16  Yes Nickie Retort, MD  GLUCOSAMINE-CHONDROITIN-VIT C PO Take 1 tablet by mouth daily.   Yes Historical Provider, MD  hydrochlorothiazide (HYDRODIURIL) 12.5 MG tablet Take 12.5 mg by mouth daily.  04/22/15  Yes Historical Provider, MD  isosorbide mononitrate (IMDUR) 30 MG 24 hr tablet Take 30 mg by mouth daily. 06/18/16  Yes Historical Provider, MD  Multiple Vitamins-Minerals (ICAPS PO) Take 1 capsule by mouth 2 (two) times daily.    Yes Historical Provider, MD  omeprazole (PRILOSEC) 20 MG capsule Take by mouth. 01/16/16  Yes Historical Provider, MD  potassium chloride SA (K-DUR,KLOR-CON) 20 MEQ tablet TAKE 2 TABLETS BY MOUTH TWICE DAILY 12/13/15  Yes Historical Provider, MD  tamsulosin (FLOMAX) 0.4 MG CAPS capsule TAKE 1 CAPSULE BY MOUTH EVERY DAY 10/28/15  Yes Historical Provider, MD      VITAL SIGNS:  Blood pressure (!) 157/75, pulse 87, temperature 97.9 F (36.6 C), temperature source Oral, resp. rate 17, height 5\' 10"  (1.778 m), weight 99.8 kg (220 lb), SpO2 91 %.  PHYSICAL EXAMINATION:  GENERAL:  76 y.o.-year-old patient lying in the bed with no acute distress.  EYES: Pupils equal, round, reactive to light and  accommodation. No scleral icterus. Extraocular muscles intact.  HEENT: Head atraumatic, normocephalic. Oropharynx and nasopharynx clear.  NECK:  Supple, no jugular venous distention. No thyroid enlargement, no tenderness.  LUNGS: Moderately diminished breath sounds bilaterally, no wheezing, positive rales,rhonchi , no crepitation. No use of accessory muscles of respiration.  CARDIOVASCULAR: S1, S2 normal. No murmurs, rubs, or gallops.  ABDOMEN: Soft, nontender, nondistended. Bowel sounds present. No organomegaly or mass.  EXTREMITIES: 2+ pitting pedal edema, no cyanosis, or clubbing.  NEUROLOGIC: Cranial nerves II through XII are intact. Muscle strength 5/5 in all extremities. Sensation intact. Gait not checked.  PSYCHIATRIC: The patient is alert and oriented x 3.  SKIN: No obvious rash, lesion, or ulcer.   LABORATORY PANEL:   CBC  Recent Labs Lab 08/07/16 1710  WBC 8.0  HGB 11.6*  HCT 35.5*  PLT 211   ------------------------------------------------------------------------------------------------------------------  Chemistries   Recent Labs Lab 08/07/16 1710  NA 138  K 3.8  CL 103  CO2 28  GLUCOSE 105*  BUN 20  CREATININE 1.45*  CALCIUM 8.7*   ------------------------------------------------------------------------------------------------------------------  Cardiac Enzymes  Recent Labs Lab 08/07/16 1710  TROPONINI 0.08*   ------------------------------------------------------------------------------------------------------------------  RADIOLOGY:  Dg Chest 2 View  Result Date: 08/07/2016 CLINICAL DATA:  76 y/o  M; 2 days of shortness of breath. EXAM: CHEST  2 VIEW COMPARISON:  06/20/2015 chest radiograph and CT chest FINDINGS: Stable normal cardiac silhouette. Stent within the descending thoracic aorta at the diaphragm. Increase interstitial markings with peripheral septal lines likely representing mild interstitial edema. Trace bilateral pleural effusions. No  acute osseous abnormality identified. IMPRESSION: Mild interstitial pulmonary edema and trace pleural effusions. Electronically Signed   By: Kristine Garbe M.D.   On: 08/07/2016 17:42    EKG:   Orders placed or performed during the hospital encounter of 08/07/16  . ED EKG  . ED EKG    IMPRESSION AND PLAN:   Peter [name]  is a 76 y.o. male with a known history of Chronic congestive heart failure, coronary artery disease, hypertension, hyperlipidemia, macular degeneration and other medical problems sees Dr. Clayborn Bigness as an outpatient is presenting to the ED with a chief complaint of shortness of breath and worsening of his lower extremity swelling. Chest x-ray has revealed interstitial edema  #Acute respiratory distress secondary to acute diastolic CHF exacerbation Admit to telemetry. Lasix 20 mg IV every 12 hours with potassium supplements as needed. Continue home medications aspirin, Plavix, Imdur, Lotrel and statin. Monitor renal function closely. Check magnesium level in a.m. Daily weight monitoring and intake and output. Cardiac consult is placed. No recent echocardiogram done, will order echocardiogram. Last echocardiogram in 2013 has revealed ejection fraction 55-60%.  #Elevated troponin with a new left bundle branch patient is asymptomatic with no chest pain Monitor patient on telemetry and cycle cardiac biomarkers. Cardiology has recommended no new interventions at this time.Continue home medications aspirin, Plavix, Imdur, Lotrel, statin  #Essential hypertension continue home medications Imdur, Lotrel. Holding hydrochlorothiazide as patient is on IV Lasix and renal function is slightly worse than his baseline.  #Hyperlipidemia check fasting lipid panel in a.m. and continue statin   DVT prophylaxis with Lovenox subcutaneous. GI prophylaxis with PPI   All the records are reviewed and case discussed with ED provider. Management plans discussed with the patient, family and  they are in agreement.  CODE STATUS: Full code, friend Opal Sidles is the healthcare power of attorney  TOTAL TIME TAKING CARE OF THIS PATIENT: 43 minutes.   Note: This dictation was prepared with Dragon dictation along with smaller phrase technology. Any transcriptional errors that result from this process are unintentional.  Nicholes Mango M.D on 08/07/2016 at 7:36 PM  Between 7am to 6pm - Pager - [phone]  After 6pm go to [email or web address] - password [email or web address] [facility]  Office  [phone]  CC: Primary care physician; Tracie Harrier, MD

## 2016-08-07 NOTE — ED Notes (Addendum)
Troponin 0.08 Dr. Alfred Levins notified

## 2016-08-07 NOTE — ED Notes (Signed)
Pt transport to 260

## 2016-08-07 NOTE — ED Triage Notes (Signed)
Pt with SOB x 2 days. rr 34, smoker, heart stent x 5, aortic stent x 1

## 2016-08-07 NOTE — ED Notes (Signed)
Pt stating that he has SOB for the last 3 days. Pt stating that "fluids are collectin' or somethin." Pt stating that he "couldn't breathe" and that is why he came in. Pt denying any chest pain at this time. Pt has +2 edema in BLE and stating difficulty breathing with ambulation and with laying down. Pt in NAD at this time. Pt has voided.

## 2016-08-07 NOTE — Progress Notes (Signed)
Patient arrived to 2A Room 260. Patient denies pain and all questions answered. Patient oriented to unit and Fall Safety Plan signed. Skin assessment completed with Sharyn Lull RN and skin intact. A&Ox4, VSS, and NSR on verified tele-box #[id number]. Nursing staff will continue to monitor for any changes in patient status. Earleen Reaper, RN

## 2016-08-07 NOTE — ED Notes (Signed)
Hospitalist is at bedside.

## 2016-08-07 NOTE — Progress Notes (Signed)
PHARMACIST - PHYSICIAN ORDER COMMUNICATION  CONCERNING: P&T Medication Policy on Herbal Medications  DESCRIPTION:  This patient's order for:  Glucosamine-chondroitin  has been noted.  This product(s) is classified as an "herbal" or natural product. Due to a lack of definitive safety studies or FDA approval, nonstandard manufacturing practices, plus the potential risk of unknown drug-drug interactions while on inpatient medications, the Pharmacy and Therapeutics Committee does not permit the use of "herbal" or natural products of this type within [place].   ACTION TAKEN: The pharmacy department is unable to verify this order at this time and your patient has been informed of this safety policy. Please reevaluate patient's clinical condition at discharge and address if the herbal or natural product(s) should be resumed at that time.   

## 2016-08-07 NOTE — ED Provider Notes (Signed)
[facility] Emergency Department Provider Note  ____________________________________________  Time seen: Approximately 6:23 PM  I have reviewed the triage vital signs and the nursing notes.   HISTORY  Chief Complaint Shortness of Breath   HPI Peter [name] is a 76 y.o. male with a history of CAD status post stents, hypertension, asthma, smoking presents for evaluation of shortness of breath. Patient reports for the last 2-3 days he has had significant shortness of breath. He has orthopnea and has been sleeping sitting up in a chair for the last 2 nights. Has been coughing sputum that is clear. No chest pain, fever, URI symptoms, nausea, vomiting, diarrhea. He has no history of heart failure and is not on Lasix. He reports that he feels like he is gasping for air 24 hours a day but that is much worse when he lays flat. He has not noticed weight gain. No wheezing. Continues to smoke.  Past Medical History:  Diagnosis Date  . Anginal pain ([place])   . Asthma   . Bladder infection, acute 03/2011   "had a whole lot of bleeding from this"  . Coronary artery disease   . High cholesterol   . Hypertension   . 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 Myocardial infarction ([place]) 1996  . NSTEMI (non-ST elevated myocardial infarction) ([place]) 12/14/2011  . Shortness of breath 12/14/2011   "@ rest, lying down, w/exertion"    Patient Active Problem List   Diagnosis Date Noted  . Congestive heart failure ([place]) 01/17/2015  . Current tobacco use 01/17/2015  . Hypercholesterolemia 01/17/2015  . Peripheral vascular disease ([place]) 01/17/2015  . 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 Arthritis, degenerative 07/22/2013  . H/O angina pectoris 07/22/2013  . Adiposity 07/22/2013  . Infection of the upper respiratory tract 07/22/2013  . Benign prostatic hyperplasia with  urinary obstruction 11/06/2012  . Acute myocardial infarction, subendocardial infarction, initial episode of care ([place]) 12/17/2011  . Acute subendocardial infarction ([place]) 12/17/2011  . Hypokalemia 12/15/2011  . SOB (shortness of breath) 12/14/2011  . CAD (coronary artery disease) 12/14/2011  . HTN (hypertension) 12/14/2011  . Hyperlipidemia 12/14/2011  . GERD (gastroesophageal reflux disease) 12/14/2011  . BPH (benign prostatic hyperplasia) 12/14/2011  . 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 Aneurysm of thoracic aorta ([place]) 12/14/2011    Past Surgical History:  Procedure Laterality Date  . [place]; ~ 2003; ~ 2008   "total of 3"  . HERNIA REPAIR  ~ 2001   "abdominal w/mesh implanted"  . LEFT HEART CATHETERIZATION WITH CORONARY ANGIOGRAM N/A 12/16/2011   Procedure: LEFT HEART CATHETERIZATION WITH CORONARY ANGIOGRAM;  Surgeon: Minus Breeding, MD;  Location: [facility] CATH LAB;  Service: Cardiovascular;  Laterality: N/A;  . PERCUTANEOUS CORONARY STENT INTERVENTION (PCI-S) N/A 12/17/2011   Procedure: PERCUTANEOUS CORONARY STENT INTERVENTION (PCI-S);  Surgeon: Sherren Mocha, MD;  Location: [facility] CATH LAB;  Service: Cardiovascular;  Laterality: N/A;  . TONSILLECTOMY     "I was a kid"    Prior to Admission medications   Medication Sig Start Date End Date Taking? Authorizing Provider  amLODipine-benazepril (LOTREL) 10-40 MG capsule TAKE 1 CAPSULE BY MOUTH EVERY DAY 10/21/15  Yes Historical Provider, MD  aspirin 325 MG tablet Take 325 mg by mouth 2 (two) times daily. Patient takes if there is any pain or burning in the chest.  Yes Historical Provider, MD  atorvastatin (LIPITOR) 40 MG tablet Take 1 tablet (40 mg total) by mouth daily at 6 PM. 12/18/11  Yes Annita Brod, MD  cetirizine (WAL-ZYR) 10 MG tablet Take 10 mg by mouth.   Yes Historical Provider, MD  clobetasol cream (TEMOVATE) 0.05 % APPLY TO PSORIASIS BID PRN UNTIL SMOOTH 11/05/14  Yes Historical  Provider, MD  clopidogrel (PLAVIX) 75 MG tablet TAKE 1 TABLET(75 MG) BY MOUTH EVERY DAY 01/21/16  Yes Historical Provider, MD  cyanocobalamin 1000 MCG tablet Take 100 mcg by mouth daily.   Yes Historical Provider, MD  finasteride (PROSCAR) 5 MG tablet Take 1 tablet (5 mg total) by mouth daily. 03/03/16  Yes Nickie Retort, MD  GLUCOSAMINE-CHONDROITIN-VIT C PO Take 1 tablet by mouth daily.   Yes Historical Provider, MD  hydrochlorothiazide (HYDRODIURIL) 12.5 MG tablet Take 12.5 mg by mouth daily.  04/22/15  Yes Historical Provider, MD  isosorbide mononitrate (IMDUR) 30 MG 24 hr tablet Take 30 mg by mouth daily. 06/18/16  Yes Historical Provider, MD  Multiple Vitamins-Minerals (ICAPS PO) Take 1 capsule by mouth 2 (two) times daily.    Yes Historical Provider, MD  omeprazole (PRILOSEC) 20 MG capsule Take by mouth. 01/16/16  Yes Historical Provider, MD  potassium chloride SA (K-DUR,KLOR-CON) 20 MEQ tablet TAKE 2 TABLETS BY MOUTH TWICE DAILY 12/13/15  Yes Historical Provider, MD  tamsulosin (FLOMAX) 0.4 MG CAPS capsule TAKE 1 CAPSULE BY MOUTH EVERY DAY 10/28/15  Yes Historical Provider, MD    Allergies Iodinated diagnostic agents and Iodinated glycerol  [glycerol, iodinated]  Family History  Problem Relation Age of Onset  . Family history unknown: Yes    Social History Social History  Substance Use Topics  . Smoking status: Current Every Day Smoker    Packs/day: 1.00    Years: 53.00    Types: Cigarettes  . Smokeless tobacco: Never Used  . Alcohol use 0.6 oz/week    1 Cans of beer per week     Comment: 12/14/2011 "if my kidneys are sore, I drink 1 beer/day; if not sore; no beer; occasionally drink socially; ave 1 beer/wk maybe"    Review of Systems  Constitutional: Negative for fever. Eyes: Negative for visual changes. ENT: Negative for sore throat. Neck: No neck pain  Cardiovascular: Negative for chest pain. + orthopnea Respiratory: + shortness of breath. Gastrointestinal: Negative for  abdominal pain, vomiting or diarrhea. Genitourinary: Negative for dysuria. Musculoskeletal: Negative for back pain. Skin: Negative for rash. Neurological: Negative for headaches, weakness or numbness. Psych: No SI or HI  ____________________________________________   PHYSICAL EXAM:  VITAL SIGNS: ED Triage Vitals  Enc Vitals Group     BP 08/07/16 1657 (!) 151/65     Pulse Rate 08/07/16 1657 84     Resp --      Temp 08/07/16 1657 97.9 F (36.6 C)     Temp Source 08/07/16 1657 Oral     SpO2 08/07/16 1657 96 %     Weight 08/07/16 1658 220 lb (99.8 kg)     Height 08/07/16 1658 5\' 10"  (1.778 m)     Head Circumference --      Peak Flow --      Pain Score --      Pain Loc --      Pain Edu? --      Excl. in [place]? --     Constitutional: Alert and oriented. Well appearing and in no apparent distress. HEENT:  Head: Normocephalic and atraumatic.         Eyes: Conjunctivae are normal. Sclera is non-icteric. EOMI. PERRL      Mouth/Throat: Mucous membranes are moist.       Neck: Supple with no signs of meningismus. Cardiovascular: Regular rate and rhythm. No murmurs, gallops, or rubs. 2+ symmetrical distal pulses are present in all extremities. Elevated JVD to level of the jaw Respiratory: Normal respiratory effort. Lungs are clear to auscultation bilaterally. No wheezes, crackles, or rhonchi.  Gastrointestinal: Soft, non tender, and non distended with positive bowel sounds. No rebound or guarding. Musculoskeletal: 2+ pitting edema of b/l LE Neurologic: Normal speech and language. Face is symmetric. Moving all extremities. No gross focal neurologic deficits are appreciated. Skin: Skin is warm, dry and intact. No rash noted. Psychiatric: Mood and affect are normal. Speech and behavior are normal.  ____________________________________________   LABS (all labs ordered are listed, but only abnormal results are displayed)  Labs Reviewed  CBC WITH DIFFERENTIAL/PLATELET - Abnormal;  Notable for the following:       Result Value   Hemoglobin 11.6 (*)    HCT 35.5 (*)    RDW 15.3 (*)    All other components within normal limits  BASIC METABOLIC PANEL - Abnormal; Notable for the following:    Glucose, Bld 105 (*)    Creatinine, Ser 1.45 (*)    Calcium 8.7 (*)    GFR calc non Af Amer 45 (*)    GFR calc Af Amer 52 (*)    All other components within normal limits  BRAIN NATRIURETIC PEPTIDE - Abnormal; Notable for the following:    B Natriuretic Peptide 474.0 (*)    All other components within normal limits  TROPONIN I - Abnormal; Notable for the following:    Troponin I 0.08 (*)    All other components within normal limits   ____________________________________________  EKG  NSR, new LBBB, rate of 78, prolonged QTC, left axis deviation, no ST elevations or depressions. LBBB is new ____________________________________________  RADIOLOGY  CXR;  Mild interstitial pulmonary edema and trace pleural effusions. ____________________________________________   PROCEDURES  Procedure(s) performed: None Procedures Critical Care performed:  None ____________________________________________   INITIAL IMPRESSION / ASSESSMENT AND PLAN / ED COURSE  76 y.o. male with a history of CAD status post stents, hypertension, asthma, smoking presents for evaluation of shortness of breath, orthopnea, elevated JVD, pitting edema. CXR concerning for pulmonary edema. No O2 requirement. Vitals stable. EKG with new LBBB. Patient has no CP. Discussed with Dr. Saralyn Pilar, Cardiology who recommended no further intervention at this time. Lasix given. Troponin at baseline. Patient will be admitted to Hospitalist.     Pertinent labs & imaging results that were available during my care of the patient were reviewed by me and considered in my medical decision making (see chart for details).    ____________________________________________   FINAL CLINICAL IMPRESSION(S) / ED DIAGNOSES  Final  diagnoses:  Acute on chronic congestive heart failure, unspecified heart failure type (HCC)  Left bundle branch block      NEW MEDICATIONS STARTED DURING THIS VISIT:  New Prescriptions   No medications on file     Note:  This document was prepared using Dragon voice recognition software and may include unintentional dictation errors.    Rudene Re, MD 08/07/16 [phone]

## 2016-08-08 ENCOUNTER — Inpatient Hospital Stay
Admit: 2016-08-08 | Discharge: 2016-08-08 | Disposition: A | Discharge status: Home / Self Care | Payer: PPO | Attending: Internal Medicine | Admitting: Internal Medicine

## 2016-08-08 LAB — ECHOCARDIOGRAM COMPLETE
HEIGHTINCHES: 70 in
WEIGHTICAEL: 3606.4 [oz_av]

## 2016-08-08 LAB — BASIC METABOLIC PANEL
ANION GAP: 7 (ref 5–15)
BUN: 21 mg/dL — ABNORMAL HIGH (ref 6–20)
CALCIUM: 8.1 mg/dL — AB (ref 8.9–10.3)
CHLORIDE: 106 mmol/L (ref 101–111)
CO2: 29 mmol/L (ref 22–32)
Creatinine, Ser: 1.45 mg/dL — ABNORMAL HIGH (ref 0.61–1.24)
GFR calc non Af Amer: 45 mL/min — ABNORMAL LOW (ref >60)
GFR, EST AFRICAN AMERICAN: 52 mL/min — AB (ref >60)
Glucose, Bld: 107 mg/dL — ABNORMAL HIGH (ref 65–99)
Potassium: 3.3 mmol/L — ABNORMAL LOW (ref 3.5–5.1)
SODIUM: 142 mmol/L (ref 135–145)

## 2016-08-08 LAB — LIPID PANEL
Cholesterol: 108 mg/dL (ref 0–200)
HDL: 36 mg/dL — ABNORMAL LOW (ref >40)
LDL Cholesterol: 50 mg/dL (ref 0–99)
Total CHOL/HDL Ratio: 3 RATIO
Triglycerides: 111 mg/dL (ref <150)
VLDL: 22 mg/dL (ref 0–40)

## 2016-08-08 LAB — TROPONIN I: TROPONIN I: 0.08 ng/mL — AB (ref <0.03)

## 2016-08-08 LAB — MAGNESIUM: Magnesium: 2 mg/dL (ref 1.7–2.4)

## 2016-08-08 MED ORDER — FUROSEMIDE 10 MG/ML IJ SOLN: 20.0000 mg | Freq: Two times a day (BID) | INTRAMUSCULAR | Status: DC

## 2016-08-08 MED ORDER — FUROSEMIDE 10 MG/ML IJ SOLN
20.0000 mg | Freq: Two times a day (BID) | INTRAMUSCULAR | Status: DC
  Administered 2016-08-08: 20 mg via INTRAVENOUS

## 2016-08-08 MED ORDER — FUROSEMIDE 10 MG/ML IJ SOLN
20.0000 mg | Freq: Two times a day (BID) | INTRAMUSCULAR | Status: DC
  Filled 2016-08-08: qty 2

## 2016-08-08 MED ORDER — CALCIUM CARBONATE ANTACID 500 MG PO CHEW
1.0000 | CHEWABLE_TABLET | Freq: Three times a day (TID) | ORAL | Status: DC | PRN
  Administered 2016-08-08: 200 mg via ORAL

## 2016-08-08 NOTE — Progress Notes (Addendum)
[place] at [place] NAME: Peter [name]    MR#:  [id number]  DATE OF BIRTH:  21-Aug-1940  SUBJECTIVE:  Patient presented with increasing shortness of breath and leg swelling and was found to be in congestive heart failure feels better after getting IV Lasix  REVIEW OF SYSTEMS:   Review of Systems  Constitutional: Negative for chills, fever and weight loss.  HENT: Negative for ear discharge, ear pain and nosebleeds.   Eyes: Negative for blurred vision, pain and discharge.  Respiratory: Negative for sputum production, shortness of breath, wheezing and stridor.   Cardiovascular: Negative for chest pain, palpitations, orthopnea and PND.  Gastrointestinal: Negative for abdominal pain, diarrhea, nausea and vomiting.  Genitourinary: Negative for frequency and urgency.  Musculoskeletal: Negative for back pain and joint pain.  Neurological: Negative for sensory change, speech change, focal weakness and weakness.  Psychiatric/Behavioral: Negative for depression and hallucinations. The patient is not nervous/anxious.    Tolerating Diet:yes Tolerating PT: not needed  DRUG ALLERGIES:   Allergies  Allergen Reactions  . Iodinated Diagnostic Agents Shortness Of Breath  . Iodinated Glycerol  [Glycerol, Iodinated] Shortness Of Breath    VITALS:  Blood pressure (!) 143/83, pulse 69, temperature 98.3 F (36.8 C), temperature source Oral, resp. rate 18, height 5\' 10"  (1.778 m), weight 102.2 kg (225 lb 6.4 oz), SpO2 99 %.  PHYSICAL EXAMINATION:   Physical Exam  GENERAL:  76 y.o.-year-old patient lying in the bed with no acute distress.  EYES: Pupils equal, round, reactive to light and accommodation. No scleral icterus. Extraocular muscles intact.  HEENT: Head atraumatic, normocephalic. Oropharynx and nasopharynx clear.  NECK:  Supple, no jugular venous distention. No thyroid enlargement, no tenderness.  LUNGS: Normal breath sounds  bilaterally, no wheezing,positive rales, norhonchi. No use of accessory muscles of respiration.  CARDIOVASCULAR: S1, S2 normal. No murmurs, rubs, or gallops.  ABDOMEN: Soft, nontender, nondistended. Bowel sounds present. No organomegaly or mass.  EXTREMITIES: No cyanosis, clubbing or edema b/l.    NEUROLOGIC: Cranial nerves II through XII are intact. No focal Motor or sensory deficits b/l.   PSYCHIATRIC:  patient is alert and oriented x 3.  SKIN: No obvious rash, lesion, or ulcer.   LABORATORY PANEL:  CBC  Recent Labs Lab 08/07/16 1710  WBC 8.0  HGB 11.6*  HCT 35.5*  PLT 211    Chemistries   Recent Labs Lab 08/08/16 0121  NA 142  K 3.3*  CL 106  CO2 29  GLUCOSE 107*  BUN 21*  CREATININE 1.45*  CALCIUM 8.1*  MG 2.0   Cardiac Enzymes  Recent Labs Lab 08/08/16 0121  TROPONINI 0.08*   RADIOLOGY:  Dg Chest 2 View  Result Date: 08/07/2016 CLINICAL DATA:  76 y/o  M; 2 days of shortness of breath. EXAM: CHEST  2 VIEW COMPARISON:  06/20/2015 chest radiograph and CT chest FINDINGS: Stable normal cardiac silhouette. Stent within the descending thoracic aorta at the diaphragm. Increase interstitial markings with peripheral septal lines likely representing mild interstitial edema. Trace bilateral pleural effusions. No acute osseous abnormality identified. IMPRESSION: Mild interstitial pulmonary edema and trace pleural effusions. Electronically Signed   By: Kristine Garbe M.D.   On: 08/07/2016 17:42   ASSESSMENT AND PLAN:  Peter [name]  is a 76 y.o. male with a known history of Chronic congestive heart failure, coronary artery disease, hypertension, hyperlipidemia, macular degeneration and other medical problems sees Dr. Clayborn Bigness as an outpatient is presenting to the ED with  a chief complaint of shortness of breath and worsening of his lower extremity swelling. Chest x-ray has revealed interstitial edema  #Acute respiratory distress secondary to acute diastolic CHF  exacerbation -Lasix 20 mg IV every 12 hours with potassium supplements as needed.  -Patient has had good urine output -Continue home medications aspirin, Plavix, Imdur, Lotrel and statin.  -Monitor renal function closely.  - Daily weight monitoring and intake and output.  - echocardiogram. Last echocardiogram in 2013 has revealed ejection fraction 55-60%.  #Elevated troponin with a new left bundle branch patient is asymptomatic with no chest pain -Cardiology has recommended no new interventions at this time. -Continue home medications aspirin, Plavix, Imdur, Lotrel, statin  #Essential hypertension - continue home medications Imdur, Lotrel. Holding hydrochlorothiazide as patient is on IV Lasix and renal function is slightly worse than his baseline.  #Hyperlipidemia check fasting lipid panel in a.m. and continue statin   DVT prophylaxis with Lovenox subcutaneous. GI prophylaxis with PPI   Case discussed with Care Management/Social Worker. Management plans discussed with the patient, family and they are in agreement.  CODE STATUS: *Full*  DVT Prophylaxis: Lovenox  TOTAL TIME TAKING CARE OF THIS PATIENT: *30* minutes.  >50% time spent on counselling and coordination of care  POSSIBLE D/C IN one to 2 DAYS, DEPENDING ON CLINICAL CONDITION.  Note: This dictation was prepared with Dragon dictation along with smaller phrase technology. Any transcriptional errors that result from this process are unintentional.  Bridney Guadarrama M.D on 08/08/2016 at 11:28 AM  Between 7am to 6pm - Pager - [phone]  After 6pm go to [email or web address] - password [email or web address] [facility]  Office  [phone]  CC: Primary care physician; Tracie Harrier, MD

## 2016-08-08 NOTE — Consult Note (Signed)
[facility] Cardiology  CARDIOLOGY CONSULT NOTE  Patient ID: Peter [name] MRN: [id number] DOB/AGE: 77-Apr-1942 77 y.o.  Admit date: 08/07/2016 Referring Physician Posey Pronto Primary Physician [facility] Primary Cardiologist [facility] Reason for Consultation Elevated troponin  HPI: 76 year old gentleman referred for evaluation of elevated troponin. The patient has known coronary artery disease, status post multiple coronary stents. He was in his usual state of health until 2-3 days prior to admission, when he developed progressive exertional dyspnea, with peripheral edema consistent with congestive heart failure. The patient presented to [facility] emergency room where ECG revealed normal left bundle-branch block. Chest x-ray revealed interstitial edema. Admission labs were notable for elevated troponin of 0.08. The patient denies chest pain. Follow-up troponin was 0.07 and 0.08.  Review of systems complete and found to be negative unless listed above     Past Medical History:  Diagnosis Date  . Anginal pain ([place])   . Asthma   . Bladder infection, acute 03/2011   "had a whole lot of bleeding from this"  . Coronary artery disease   . High cholesterol   . Hypertension   . 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 Myocardial infarction ([place]) 1996  . NSTEMI (non-ST elevated myocardial infarction) ([place]) 12/14/2011  . Shortness of breath 12/14/2011   "@ rest, lying down, w/exertion"    Past Surgical History:  Procedure Laterality Date  . [place]; ~ 2003; ~ 2008   "total of 3"  . HERNIA REPAIR  ~ 2001   "abdominal w/mesh implanted"  . LEFT HEART CATHETERIZATION WITH CORONARY ANGIOGRAM N/A 12/16/2011   Procedure: LEFT HEART CATHETERIZATION WITH CORONARY ANGIOGRAM;  Surgeon: Minus Breeding, MD;  Location: [facility] CATH LAB;  Service: Cardiovascular;  Laterality: N/A;  . PERCUTANEOUS CORONARY STENT INTERVENTION (PCI-S) N/A 12/17/2011    Procedure: PERCUTANEOUS CORONARY STENT INTERVENTION (PCI-S);  Surgeon: Sherren Mocha, MD;  Location: [facility] CATH LAB;  Service: Cardiovascular;  Laterality: N/A;  . TONSILLECTOMY     "I was a kid"    Prescriptions Prior to Admission  Medication Sig Dispense Refill Last Dose  . amLODipine-benazepril (LOTREL) 10-40 MG capsule TAKE 1 CAPSULE BY MOUTH EVERY DAY   08/07/2016 at 0800  . aspirin 325 MG tablet Take 325 mg by mouth 2 (two) times daily. Patient takes if there is any pain or burning in the chest.   08/07/2016 at 0800  . atorvastatin (LIPITOR) 40 MG tablet Take 1 tablet (40 mg total) by mouth daily at 6 PM. 30 tablet 0 08/06/2016 at 2000  . cetirizine (WAL-ZYR) 10 MG tablet Take 10 mg by mouth.   08/07/2016 at 0800  . clobetasol cream (TEMOVATE) 0.05 % APPLY TO PSORIASIS BID PRN UNTIL SMOOTH  1 PRN at PRN  . clopidogrel (PLAVIX) 75 MG tablet TAKE 1 TABLET(75 MG) BY MOUTH EVERY DAY   08/07/2016 at 0800  . cyanocobalamin 1000 MCG tablet Take 100 mcg by mouth daily.   08/07/2016 at 0800  . finasteride (PROSCAR) 5 MG tablet Take 1 tablet (5 mg total) by mouth daily. 30 tablet 11 08/07/2016 at 0800  . GLUCOSAMINE-CHONDROITIN-VIT C PO Take 1 tablet by mouth daily.   08/07/2016 at 0800  . hydrochlorothiazide (HYDRODIURIL) 12.5 MG tablet Take 12.5 mg by mouth daily.    08/07/2016 at 0800  . isosorbide mononitrate (IMDUR) 30 MG 24 hr tablet Take 30 mg by mouth daily.   08/07/2016 at 0800  . Multiple Vitamins-Minerals (  ICAPS PO) Take 1 capsule by mouth 2 (two) times daily.    08/07/2016 at 0800  . omeprazole (PRILOSEC) 20 MG capsule Take by mouth.   08/07/2016 at 0800  . potassium chloride SA (K-DUR,KLOR-CON) 20 MEQ tablet TAKE 2 TABLETS BY MOUTH TWICE DAILY   08/07/2016 at 0800  . tamsulosin (FLOMAX) 0.4 MG CAPS capsule TAKE 1 CAPSULE BY MOUTH EVERY DAY   08/07/2016 at 0800   Social History   Social History  . Marital status: Widowed    Spouse name: N/A  . Number of children: N/A  . Years of education: N/A    Occupational History  . Not on file.   Social History Main Topics  . Smoking status: Current Every Day Smoker    Packs/day: 1.00    Years: 53.00    Types: Cigarettes  . Smokeless tobacco: Never Used  . Alcohol use 0.6 oz/week    1 Cans of beer per week     Comment: 12/14/2011 "if my kidneys are sore, I drink 1 beer/day; if not sore; no beer; occasionally drink socially; ave 1 beer/wk maybe"  . Drug use: No  . Sexual activity: Not Currently   Other Topics Concern  . Not on file   Social History Narrative  . No narrative on file    Family History  Problem Relation Age of Onset  . Family history unknown: Yes      Review of systems complete and found to be negative unless listed above      PHYSICAL EXAM  General: Well developed, well nourished, in no acute distress HEENT:  Normocephalic and atramatic Neck:  No JVD.  Lungs: Clear bilaterally to auscultation and percussion. Heart: HRRR . Normal S1 and S2 without gallops or murmurs.  Abdomen: Bowel sounds are positive, abdomen soft and non-tender  Msk:  Back normal, normal gait. Normal strength and tone for age. Extremities: No clubbing, cyanosis or edema.   Neuro: Alert and oriented X 3. Psych:  Good affect, responds appropriately  Labs:   Lab Results  Component Value Date   WBC 8.0 08/07/2016   HGB 11.6 (L) 08/07/2016   HCT 35.5 (L) 08/07/2016   MCV 80.3 08/07/2016   PLT 211 08/07/2016    Recent Labs Lab 08/08/16 0121  NA 142  K 3.3*  CL 106  CO2 29  BUN 21*  CREATININE 1.45*  CALCIUM 8.1*  GLUCOSE 107*   Lab Results  Component Value Date   CKTOTAL 23 (L) 12/11/2013   CKMB 1.0 12/11/2013   TROPONINI 0.08 (HH) 08/08/2016    Lab Results  Component Value Date   CHOL 108 08/08/2016   CHOL 141 12/16/2011   Lab Results  Component Value Date   HDL 36 (L) 08/08/2016   HDL 38 (L) 12/16/2011   Lab Results  Component Value Date   LDLCALC 50 08/08/2016   LDLCALC 83 12/16/2011   Lab Results   Component Value Date   TRIG 111 08/08/2016   TRIG 102 12/16/2011   Lab Results  Component Value Date   CHOLHDL 3.0 08/08/2016   CHOLHDL 3.7 12/16/2011   No results found for: LDLDIRECT    Radiology: Dg Chest 2 View  Result Date: 08/07/2016 CLINICAL DATA:  76 y/o  M; 2 days of shortness of breath. EXAM: CHEST  2 VIEW COMPARISON:  06/20/2015 chest radiograph and CT chest FINDINGS: Stable normal cardiac silhouette. Stent within the descending thoracic aorta at the diaphragm. Increase interstitial markings with peripheral septal lines likely  representing mild interstitial edema. Trace bilateral pleural effusions. No acute osseous abnormality identified. IMPRESSION: Mild interstitial pulmonary edema and trace pleural effusions. Electronically Signed   By: Kristine Garbe M.D.   On: 08/07/2016 17:42    EKG: Left bundle branch block  ASSESSMENT AND PLAN:   1. Borderline elevated troponin, in the setting of congestive heart failure, and new left bundle branch block 2. Acute on chronic systolic congestive heart failure 3. New left bundle branch block 4. Peripheral vascular disease, status post endovascular stent  Recommendations  1. Agree with current therapy 2. Defer full dose anticoagulation at this time unless patient develops chest pain 3. Proceed with cardiac catheterization with selective coronary arteriography on 08/10/2016. The risks, benefits alternatives to cardiac catheterization were explained to the patient and informed written consent was obtained.   Signed: [name] MD,PhD, [facility] 08/08/2016, 9:26 AM

## 2016-08-09 LAB — BASIC METABOLIC PANEL
Anion gap: 7 (ref 5–15)
BUN: 18 mg/dL (ref 6–20)
CHLORIDE: 102 mmol/L (ref 101–111)
CO2: 30 mmol/L (ref 22–32)
CREATININE: 1.34 mg/dL — AB (ref 0.61–1.24)
Calcium: 8.8 mg/dL — ABNORMAL LOW (ref 8.9–10.3)
GFR calc Af Amer: 58 mL/min — ABNORMAL LOW (ref >60)
GFR calc non Af Amer: 50 mL/min — ABNORMAL LOW (ref >60)
GLUCOSE: 133 mg/dL — AB (ref 65–99)
Potassium: 3.6 mmol/L (ref 3.5–5.1)
Sodium: 139 mmol/L (ref 135–145)

## 2016-08-09 MED ORDER — SODIUM CHLORIDE 0.9 % WEIGHT BASED INFUSION: 1.0000 mL/kg/h | INTRAVENOUS | Status: DC

## 2016-08-09 MED ORDER — FUROSEMIDE 10 MG/ML IJ SOLN
20.0000 mg | Freq: Two times a day (BID) | INTRAMUSCULAR | Status: DC
  Administered 2016-08-09: 20 mg via INTRAVENOUS
  Filled 2016-08-09: qty 2

## 2016-08-09 MED ORDER — SODIUM CHLORIDE 0.9% FLUSH
3.0000 mL | Freq: Two times a day (BID) | INTRAVENOUS | Status: DC
  Administered 2016-08-09: 3 mL via INTRAVENOUS

## 2016-08-09 MED ORDER — SODIUM CHLORIDE 0.9% FLUSH: 3.0000 mL | INTRAVENOUS | Status: DC | PRN

## 2016-08-09 MED ORDER — ASPIRIN 81 MG PO CHEW
81.0000 mg | CHEWABLE_TABLET | ORAL | Status: AC
  Administered 2016-08-10: 81 mg via ORAL
  Filled 2016-08-09: qty 1

## 2016-08-09 MED ORDER — SODIUM CHLORIDE 0.9 % WEIGHT BASED INFUSION
3.0000 mL/kg/h | INTRAVENOUS | Status: AC
  Administered 2016-08-10: 3 mL/kg/h via INTRAVENOUS

## 2016-08-09 MED ORDER — SODIUM CHLORIDE 0.9 % IV SOLN: 250.0000 mL | INTRAVENOUS | Status: DC | PRN

## 2016-08-09 MED ORDER — FUROSEMIDE 10 MG/ML IJ SOLN
20.0000 mg | Freq: Every day | INTRAMUSCULAR | Status: DC
  Administered 2016-08-10: 20 mg via INTRAVENOUS
  Filled 2016-08-09: qty 2

## 2016-08-09 NOTE — Progress Notes (Signed)
CCMD notified this nurse that the patients QTc is 545. Dr. Saralyn Pilar notified. No orders received

## 2016-08-09 NOTE — Progress Notes (Signed)
[facility] Cardiology  SUBJECTIVE: I don't have any chest pain   Vitals:   08/08/16 0700 08/08/16 1520 08/08/16 1949 08/09/16 0523  BP: (!) 143/83 (!) 141/64 (!) 150/78 (!) 159/73  Pulse: 69 77 85 82  Resp:  16 18 18   Temp:  98.1 F (36.7 C) 98 F (36.7 C) 97.8 F (36.6 C)  TempSrc:   Oral Oral  SpO2: 99% 96% 94% 95%  Weight:    100.4 kg (221 lb 6.4 oz)  Height:         Intake/Output Summary (Last 24 hours) at 08/09/16 0954 Last data filed at 08/09/16 0900  Gross per 24 hour  Intake              840 ml  Output             2100 ml  Net            -1260 ml      PHYSICAL EXAM  General: Well developed, well nourished, in no acute distress HEENT:  Normocephalic and atramatic Neck:  No JVD.  Lungs: Clear bilaterally to auscultation and percussion. Heart: HRRR . Normal S1 and S2 without gallops or murmurs.  Abdomen: Bowel sounds are positive, abdomen soft and non-tender  Msk:  Back normal, normal gait. Normal strength and tone for age. Extremities: No clubbing, cyanosis or edema.   Neuro: Alert and oriented X 3. Psych:  Good affect, responds appropriately   LABS: Basic Metabolic Panel:  Recent Labs  08/08/16 0121 08/09/16 0810  NA 142 139  K 3.3* 3.6  CL 106 102  CO2 29 30  GLUCOSE 107* 133*  BUN 21* 18  CREATININE 1.45* 1.34*  CALCIUM 8.1* 8.8*  MG 2.0  --    Liver Function Tests: No results for input(s): AST, ALT, ALKPHOS, BILITOT, PROT, ALBUMIN in the last 72 hours. No results for input(s): LIPASE, AMYLASE in the last 72 hours. CBC:  Recent Labs  08/07/16 1710  WBC 8.0  NEUTROABS 5.3  HGB 11.6*  HCT 35.5*  MCV 80.3  PLT 211   Cardiac Enzymes:  Recent Labs  08/07/16 1710 08/07/16 1944 08/08/16 0121  TROPONINI 0.08* 0.07* 0.08*   BNP: Invalid input(s): POCBNP D-Dimer: No results for input(s): DDIMER in the last 72 hours. Hemoglobin A1C: No results for input(s): HGBA1C in the last 72 hours. Fasting Lipid Panel:  Recent Labs   08/08/16 0121  CHOL 108  HDL 36*  LDLCALC 50  TRIG 111  CHOLHDL 3.0   Thyroid Function Tests: No results for input(s): TSH, T4TOTAL, T3FREE, THYROIDAB in the last 72 hours.  Invalid input(s): FREET3 Anemia Panel: No results for input(s): VITAMINB12, FOLATE, FERRITIN, TIBC, IRON, RETICCTPCT in the last 72 hours.  Dg Chest 2 View  Result Date: 08/07/2016 CLINICAL DATA:  76 y/o  M; 2 days of shortness of breath. EXAM: CHEST  2 VIEW COMPARISON:  06/20/2015 chest radiograph and CT chest FINDINGS: Stable normal cardiac silhouette. Stent within the descending thoracic aorta at the diaphragm. Increase interstitial markings with peripheral septal lines likely representing mild interstitial edema. Trace bilateral pleural effusions. No acute osseous abnormality identified. IMPRESSION: Mild interstitial pulmonary edema and trace pleural effusions. Electronically Signed   By: Kristine Garbe M.D.   On: 08/07/2016 17:42     Echo LV EF 35-40%  TELEMETRY: Sinus rhythm with left bundle branch block:  ASSESSMENT AND PLAN:  Active Problems:   Acute CHF (congestive heart failure) ([place])    1. Borderline elevated troponin,  in the setting of acute on chronic congestive heart failure, with new LBBB 2. Acute on chronic systolic congestive heart failure 3. Known peripheral vascular disease, status post endovascular stent  Recommendations  1. Continue current medications 2. Defer full dose anticoagulation at this time 3. Proceed with cardiac catheterization with selective coronary arteriography 08/10/2016. The risks, benefits and alternatives to cardiac catheterization and possible PCI were explained to the patient and informed consent was obtained   [name], MD, PhD, [facility] 08/09/2016 9:54 AM

## 2016-08-09 NOTE — Progress Notes (Signed)
Dr. Saralyn Pilar stated that he would like for patient to get his Lasix earlier than scheduled tomorrow morning due to him being first case for a heart cath tomorrow. Will change time of medication and relay message to night nurse.

## 2016-08-09 NOTE — Progress Notes (Addendum)
[place] at [place] NAME: Peter [name]    MR#:  [id number]  DATE OF BIRTH:  Nov 16, 1940  SUBJECTIVE:  Patient presented with increasing shortness of breath and leg swelling and was found to be in congestive heart failure feels better after getting IV Lasix  REVIEW OF SYSTEMS:   Review of Systems  Constitutional: Negative for chills, fever and weight loss.  HENT: Negative for ear discharge, ear pain and nosebleeds.   Eyes: Negative for blurred vision, pain and discharge.  Respiratory: Negative for sputum production, shortness of breath, wheezing and stridor.   Cardiovascular: Negative for chest pain, palpitations, orthopnea and PND.  Gastrointestinal: Negative for abdominal pain, diarrhea, nausea and vomiting.  Genitourinary: Negative for frequency and urgency.  Musculoskeletal: Negative for back pain and joint pain.  Neurological: Negative for sensory change, speech change, focal weakness and weakness.  Psychiatric/Behavioral: Negative for depression and hallucinations. The patient is not nervous/anxious.    Tolerating Diet:yes Tolerating PT: not needed  DRUG ALLERGIES:   Allergies  Allergen Reactions  . Iodinated Diagnostic Agents Shortness Of Breath  . Iodinated Glycerol  [Glycerol, Iodinated] Shortness Of Breath    VITALS:  Blood pressure (!) 159/73, pulse 82, temperature 97.8 F (36.6 C), temperature source Oral, resp. rate 18, height 5\' 10"  (1.778 m), weight 100.4 kg (221 lb 6.4 oz), SpO2 95 %.  PHYSICAL EXAMINATION:   Physical Exam  GENERAL:  76 y.o.-year-old patient lying in the bed with no acute distress.  EYES: Pupils equal, round, reactive to light and accommodation. No scleral icterus. Extraocular muscles intact.  HEENT: Head atraumatic, normocephalic. Oropharynx and nasopharynx clear.  NECK:  Supple, no jugular venous distention. No thyroid enlargement, no tenderness.  LUNGS: Normal breath sounds  bilaterally, no wheezing,positive rales, norhonchi. No use of accessory muscles of respiration.  CARDIOVASCULAR: S1, S2 normal. No murmurs, rubs, or gallops.  ABDOMEN: Soft, nontender, nondistended. Bowel sounds present. No organomegaly or mass.  EXTREMITIES: No cyanosis, clubbing or edema b/l.    NEUROLOGIC: Cranial nerves II through XII are intact. No focal Motor or sensory deficits b/l.   PSYCHIATRIC:  patient is alert and oriented x 3.  SKIN: No obvious rash, lesion, or ulcer.   LABORATORY PANEL:  CBC  Recent Labs Lab 08/07/16 1710  WBC 8.0  HGB 11.6*  HCT 35.5*  PLT 211    Chemistries   Recent Labs Lab 08/08/16 0121 08/09/16 0810  NA 142 139  K 3.3* 3.6  CL 106 102  CO2 29 30  GLUCOSE 107* 133*  BUN 21* 18  CREATININE 1.45* 1.34*  CALCIUM 8.1* 8.8*  MG 2.0  --    Cardiac Enzymes  Recent Labs Lab 08/08/16 0121  TROPONINI 0.08*   RADIOLOGY:  Dg Chest 2 View  Result Date: 08/07/2016 CLINICAL DATA:  76 y/o  M; 2 days of shortness of breath. EXAM: CHEST  2 VIEW COMPARISON:  06/20/2015 chest radiograph and CT chest FINDINGS: Stable normal cardiac silhouette. Stent within the descending thoracic aorta at the diaphragm. Increase interstitial markings with peripheral septal lines likely representing mild interstitial edema. Trace bilateral pleural effusions. No acute osseous abnormality identified. IMPRESSION: Mild interstitial pulmonary edema and trace pleural effusions. Electronically Signed   By: Kristine Garbe M.D.   On: 08/07/2016 17:42   ASSESSMENT AND PLAN:  Peter [name]  is a 76 y.o. male with a known history of Chronic congestive heart failure, coronary artery disease, hypertension, hyperlipidemia, macular degeneration and other medical  problems sees Dr. Clayborn Bigness as an outpatient is presenting to the ED with a chief complaint of shortness of breath and worsening of his lower extremity swelling. Chest x-ray has revealed interstitial edema  #Acute  respiratory distress secondary to acute systolic CHF exacerbation -Lasix 20 mg IV every 12 hours with potassium supplements as needed. --change to IV daily lasix -Patient has had good urine output 6.2 liters since admission -Continue home medications aspirin, Plavix, Imdur, Lotrel and statin.  -Monitor renal function closely.  - Daily weight monitoring and intake and output.  - echocardiogram. Last echocardiogram in 2013 has revealed ejection fraction 55-60%. -Echo 2018 showed EF 35-40%. Cardiology plans cath tomorrow  #Elevated troponin with a new left bundle branch patient is asymptomatic with no chest pain -Cardiology has recommended no new interventions at this time. -Continue home medications aspirin, Plavix, Imdur, Lotrel, statin -cath tomorrow  #Essential hypertension - continue home medications Imdur, Lotrel. Holding hydrochlorothiazide as patient is on IV Lasix and renal function is slightly worse than his baseline.  #Hyperlipidemia check fasting lipid panel in a.m. and continue statin   DVT prophylaxis with Lovenox subcutaneous. GI prophylaxis with PPI   Case discussed with Care Management/Social Worker. Management plans discussed with the patient, family and they are in agreement.  CODE STATUS: *Full*  DVT Prophylaxis: Lovenox  TOTAL TIME TAKING CARE OF THIS PATIENT: *30* minutes.  >50% time spent on counselling and coordination of care  POSSIBLE D/C IN one to 2 DAYS, DEPENDING ON CLINICAL CONDITION.  Note: This dictation was prepared with Dragon dictation along with smaller phrase technology. Any transcriptional errors that result from this process are unintentional.  Cas Tracz M.D on 08/09/2016 at 1:04 PM  Between 7am to 6pm - Pager - [phone]  After 6pm go to [email or web address] - password [email or web address] [facility]  Office  [phone]  CC: Primary care physician; Tracie Harrier, MD

## 2016-08-10 ENCOUNTER — Encounter: Payer: Self-pay | Admitting: Cardiology

## 2016-08-10 ENCOUNTER — Encounter
Admission: EM | Disposition: A | Discharge status: Home / Self Care | Payer: Self-pay | Source: Home / Self Care | Attending: Internal Medicine

## 2016-08-10 HISTORY — PX: LEFT HEART CATH AND CORONARY ANGIOGRAPHY: CATH118249

## 2016-08-10 HISTORY — PX: CORONARY STENT INTERVENTION: CATH118234

## 2016-08-10 LAB — POCT ACTIVATED CLOTTING TIME: ACTIVATED CLOTTING TIME: 318 s

## 2016-08-10 SURGERY — LEFT HEART CATH AND CORONARY ANGIOGRAPHY
Anesthesia: Moderate Sedation

## 2016-08-10 MED ORDER — LIDOCAINE HCL (PF) 1 % IJ SOLN
INTRAMUSCULAR | Status: DC | PRN
  Administered 2016-08-10: 18 mL via SUBCUTANEOUS

## 2016-08-10 MED ORDER — ASPIRIN 81 MG PO CHEW
CHEWABLE_TABLET | ORAL | Status: AC
  Filled 2016-08-10: qty 3

## 2016-08-10 MED ORDER — DIAZEPAM 5 MG PO TABS: 5.0000 mg | ORAL_TABLET | ORAL | Status: DC | PRN

## 2016-08-10 MED ORDER — LABETALOL HCL 5 MG/ML IV SOLN
10.0000 mg | INTRAVENOUS | Status: AC | PRN
  Administered 2016-08-10: 10 mg via INTRAVENOUS

## 2016-08-10 MED ORDER — MIDAZOLAM HCL 2 MG/2ML IJ SOLN
INTRAMUSCULAR | Status: DC | PRN
  Administered 2016-08-10: 1 mg via INTRAVENOUS

## 2016-08-10 MED ORDER — ACETAMINOPHEN 325 MG PO TABS: 650.0000 mg | ORAL_TABLET | ORAL | Status: DC | PRN

## 2016-08-10 MED ORDER — ONDANSETRON HCL 4 MG/2ML IJ SOLN: 4.0000 mg | Freq: Four times a day (QID) | INTRAMUSCULAR | Status: DC | PRN

## 2016-08-10 MED ORDER — SODIUM CHLORIDE 0.9% FLUSH
3.0000 mL | Freq: Two times a day (BID) | INTRAVENOUS | Status: DC
  Administered 2016-08-10 (×2): 3 mL via INTRAVENOUS

## 2016-08-10 MED ORDER — LABETALOL HCL 5 MG/ML IV SOLN
INTRAVENOUS | Status: AC
  Administered 2016-08-10: 14:00:00
  Filled 2016-08-10: qty 4

## 2016-08-10 MED ORDER — LIDOCAINE HCL (PF) 1 % IJ SOLN
INTRAMUSCULAR | Status: AC
  Filled 2016-08-10: qty 30

## 2016-08-10 MED ORDER — FENTANYL CITRATE (PF) 100 MCG/2ML IJ SOLN
INTRAMUSCULAR | Status: DC | PRN
  Administered 2016-08-10: 25 ug via INTRAVENOUS

## 2016-08-10 MED ORDER — ALUM & MAG HYDROXIDE-SIMETH 200-200-20 MG/5ML PO SUSP
ORAL | Status: AC
  Administered 2016-08-10: 10:00:00
  Filled 2016-08-10: qty 30

## 2016-08-10 MED ORDER — DIPHENHYDRAMINE HCL 50 MG/ML IJ SOLN
INTRAMUSCULAR | Status: DC | PRN
  Administered 2016-08-10: 50 mg via INTRAVENOUS

## 2016-08-10 MED ORDER — NITROGLYCERIN 5 MG/ML IV SOLN
INTRAVENOUS | Status: AC
  Filled 2016-08-10: qty 10

## 2016-08-10 MED ORDER — SODIUM CHLORIDE 0.9% FLUSH: 3.0000 mL | INTRAVENOUS | Status: DC | PRN

## 2016-08-10 MED ORDER — FAMOTIDINE 20 MG PO TABS
ORAL_TABLET | ORAL | Status: AC
  Administered 2016-08-10: 20 mg
  Filled 2016-08-10: qty 1

## 2016-08-10 MED ORDER — HYDRALAZINE HCL 20 MG/ML IJ SOLN: 5.0000 mg | INTRAMUSCULAR | Status: AC | PRN

## 2016-08-10 MED ORDER — FUROSEMIDE 20 MG PO TABS
20.0000 mg | ORAL_TABLET | Freq: Every day | ORAL | Status: DC
  Administered 2016-08-10 – 2016-08-11 (×2): 20 mg via ORAL
  Filled 2016-08-10 (×4): qty 1

## 2016-08-10 MED ORDER — ALUM & MAG HYDROXIDE-SIMETH 200-200-20 MG/5ML PO SUSP: 30.0000 mL | ORAL | Status: DC | PRN

## 2016-08-10 MED ORDER — IOPAMIDOL (ISOVUE-300) INJECTION 61%
INTRAVENOUS | Status: DC | PRN
  Administered 2016-08-10: 230 mL via INTRA_ARTERIAL

## 2016-08-10 MED ORDER — FENTANYL CITRATE (PF) 100 MCG/2ML IJ SOLN
INTRAMUSCULAR | Status: AC
  Filled 2016-08-10: qty 2

## 2016-08-10 MED ORDER — METHYLPREDNISOLONE SODIUM SUCC 125 MG IJ SOLR: 125.0000 mg | Freq: Once | INTRAMUSCULAR | Status: AC

## 2016-08-10 MED ORDER — MIDAZOLAM HCL 2 MG/2ML IJ SOLN
INTRAMUSCULAR | Status: AC
  Filled 2016-08-10: qty 2

## 2016-08-10 MED ORDER — METHYLPREDNISOLONE SODIUM SUCC 125 MG IJ SOLR
INTRAMUSCULAR | Status: AC
  Administered 2016-08-10: 125 mg
  Filled 2016-08-10: qty 2

## 2016-08-10 MED ORDER — FAMOTIDINE 20 MG PO TABS: 20.0000 mg | ORAL_TABLET | Freq: Once | ORAL | Status: AC

## 2016-08-10 MED ORDER — DIPHENHYDRAMINE HCL 50 MG/ML IJ SOLN
INTRAMUSCULAR | Status: AC
  Administered 2016-08-10: 08:00:00
  Filled 2016-08-10: qty 1

## 2016-08-10 MED ORDER — CLOPIDOGREL BISULFATE 300 MG PO TABS
ORAL_TABLET | ORAL | Status: AC
  Filled 2016-08-10: qty 1

## 2016-08-10 MED ORDER — BIVALIRUDIN TRIFLUOROACETATE 250 MG IV SOLR
INTRAVENOUS | Status: AC
  Filled 2016-08-10: qty 250

## 2016-08-10 MED ORDER — CLOPIDOGREL BISULFATE 75 MG PO TABS: 75.0000 mg | ORAL_TABLET | Freq: Every day | ORAL | Status: DC

## 2016-08-10 MED ORDER — ASPIRIN 81 MG PO CHEW
CHEWABLE_TABLET | ORAL | Status: DC | PRN
  Administered 2016-08-10: 243 mg via ORAL

## 2016-08-10 MED ORDER — SODIUM CHLORIDE 0.9 % IV SOLN: 250.0000 mL | INTRAVENOUS | Status: DC | PRN

## 2016-08-10 MED ORDER — SODIUM CHLORIDE 0.9 % IV SOLN
INTRAVENOUS | Status: DC | PRN
  Administered 2016-08-10: 1.75 mg/kg/h via INTRAVENOUS

## 2016-08-10 MED ORDER — CLOPIDOGREL BISULFATE 75 MG PO TABS
ORAL_TABLET | ORAL | Status: DC | PRN
  Administered 2016-08-10: 300 mg via ORAL

## 2016-08-10 MED ORDER — ASPIRIN 81 MG PO CHEW
81.0000 mg | CHEWABLE_TABLET | Freq: Every day | ORAL | Status: DC
  Administered 2016-08-10: 81 mg via ORAL
  Filled 2016-08-10: qty 1

## 2016-08-10 MED ORDER — NITROGLYCERIN 1 MG/10 ML FOR IR/CATH LAB
INTRA_ARTERIAL | Status: DC | PRN
  Administered 2016-08-10 (×2): 200 ug

## 2016-08-10 MED ORDER — HEPARIN (PORCINE) IN NACL 2-0.9 UNIT/ML-% IJ SOLN
INTRAMUSCULAR | Status: AC
  Filled 2016-08-10: qty 500

## 2016-08-10 MED ORDER — BIVALIRUDIN BOLUS VIA INFUSION - CUPID
INTRAVENOUS | Status: DC | PRN
  Administered 2016-08-10: 75 mg via INTRAVENOUS

## 2016-08-10 MED ORDER — SODIUM CHLORIDE 0.9 % WEIGHT BASED INFUSION
1.0000 mL/kg/h | INTRAVENOUS | Status: AC
  Administered 2016-08-10: 1 mL/kg/h via INTRAVENOUS

## 2016-08-10 SURGICAL SUPPLY — 16 items
BALLN TREK RX 3.0X20 (BALLOONS) ×2
BALLOON TREK RX 3.0X20 (BALLOONS) ×1 IMPLANT
CATH INFINITI 5FR ANG PIGTAIL (CATHETERS) ×2 IMPLANT
CATH INFINITI 5FR JL4 (CATHETERS) ×2 IMPLANT
CATH INFINITI 5FR JL5 (CATHETERS) ×2 IMPLANT
CATH INFINITI JR4 5F (CATHETERS) ×2 IMPLANT
CATH VISTA GUIDE 6FR XB3.5 (CATHETERS) ×2 IMPLANT
DEVICE INFLAT 30 PLUS (MISCELLANEOUS) ×2 IMPLANT
KIT MANI 3VAL PERCEP (MISCELLANEOUS) ×2 IMPLANT
NEEDLE PERC 18GX7CM (NEEDLE) ×2 IMPLANT
PACK CARDIAC CATH (CUSTOM PROCEDURE TRAY) ×2 IMPLANT
SHEATH AVANTI 5FR X 11CM (SHEATH) ×2 IMPLANT
SHEATH AVANTI 6FR X 11CM (SHEATH) ×2 IMPLANT
STENT XIENCE ALPINE RX 3.25X23 (Permanent Stent) ×2 IMPLANT
WIRE ASAHI PROWATER 180CM (WIRE) ×2 IMPLANT
WIRE EMERALD 3MM-J .035X150CM (WIRE) ×2 IMPLANT

## 2016-08-10 NOTE — Progress Notes (Signed)
[place] [facility] [place] NAME: Peter [name]    MR#:  [id number]  DATE OF BIRTH:  April 30, 1940  SUBJECTIVE:  Patient presented with increasing shortness of breath and leg swelling and was found to be in congestive heart failure feels better after getting IV Lasix Patient seen in specials recovery. He is status post cath with stent placement. Denies any complaints. Better now.  REVIEW OF SYSTEMS:   Review of Systems  Constitutional: Negative for chills, fever and weight loss.  HENT: Negative for ear discharge, ear pain and nosebleeds.   Eyes: Negative for blurred vision, pain and discharge.  Respiratory: Negative for sputum production, shortness of breath, wheezing and stridor.   Cardiovascular: Negative for chest pain, palpitations, orthopnea and PND.  Gastrointestinal: Negative for abdominal pain, diarrhea, nausea and vomiting.  Genitourinary: Negative for frequency and urgency.  Musculoskeletal: Negative for back pain and joint pain.  Neurological: Negative for sensory change, speech change, focal weakness and weakness.  Psychiatric/Behavioral: Negative for depression and hallucinations. The patient is not nervous/anxious.    Tolerating Diet:yes Tolerating PT: not needed  DRUG ALLERGIES:   Allergies  Allergen Reactions  . Iodinated Diagnostic Agents Shortness Of Breath  . Iodinated Glycerol  [Glycerol, Iodinated] Shortness Of Breath    VITALS:  Blood pressure (!) 164/88, pulse 80, temperature 98.2 F (36.8 C), temperature source Oral, resp. rate 16, height 5\' 10"  (1.778 m), weight 100 kg (220 lb 8 oz), SpO2 96 %.  PHYSICAL EXAMINATION:   Physical Exam  GENERAL:  76 y.o.-year-old patient lying in the bed with no acute distress.  EYES: Pupils equal, round, reactive to light and accommodation. No scleral icterus. Extraocular muscles intact.  HEENT: Head atraumatic, normocephalic. Oropharynx and nasopharynx clear.  NECK:   Supple, no jugular venous distention. No thyroid enlargement, no tenderness.  LUNGS: Normal breath sounds bilaterally, no wheezing,positive rales, norhonchi. No use of accessory muscles of respiration.  CARDIOVASCULAR: S1, S2 normal. No murmurs, rubs, or gallops.  ABDOMEN: Soft, nontender, nondistended. Bowel sounds present. No organomegaly or mass.  EXTREMITIES: No cyanosis, clubbing or edema b/l.    NEUROLOGIC: Cranial nerves II through XII are intact. No focal Motor or sensory deficits b/l.   PSYCHIATRIC:  patient is alert and oriented x 3.  SKIN: No obvious rash, lesion, or ulcer.   LABORATORY PANEL:  CBC  Recent Labs Lab 08/07/16 1710  WBC 8.0  HGB 11.6*  HCT 35.5*  PLT 211    Chemistries   Recent Labs Lab 08/08/16 0121 08/09/16 0810  NA 142 139  K 3.3* 3.6  CL 106 102  CO2 29 30  GLUCOSE 107* 133*  BUN 21* 18  CREATININE 1.45* 1.34*  CALCIUM 8.1* 8.8*  MG 2.0  --    Cardiac Enzymes  Recent Labs Lab 08/08/16 0121  TROPONINI 0.08*   RADIOLOGY:  No results found. ASSESSMENT AND PLAN:  Peter [name]  is a 76 y.o. male with a known history of Chronic congestive heart failure, coronary artery disease, hypertension, hyperlipidemia, macular degeneration and other medical problems sees Dr. Clayborn Bigness as an outpatient is presenting to the ED with a chief complaint of shortness of breath and worsening of his lower extremity swelling. Chest x-ray has revealed interstitial edema  #Acute respiratory distress secondary to acute systolic CHF exacerbation -Lasix 20 mg IV every 12 hours with potassium supplements as needed. --change to po daily lasix -Patient has had good urine output 6.2 liters since admission -Continue home medications  aspirin, Plavix, Imdur, Lotrel and statin.  -Monitor renal function closely.  - Daily weight monitoring and intake and output.  - echocardiogram. Last echocardiogram in 2013 has revealed ejection fraction 55-60%. -Echo 2018 showed EF  35-40%.   #Elevated troponin with a new left bundle branch patient is asymptomatic with no chest pain -Cardiology has recommended no new interventions at this time. -Continue home medications aspirin, Plavix, Imdur, Lotrel, statin -cath showed in-stent stenosis status post DES and proximal LAD  #Essential hypertension - continue home medications Imdur, Lotrel. Holding hydrochlorothiazide as patient is on IV Lasix and renal function is slightly worse than his baseline.  #Hyperlipidemia check fasting lipid panel in a.m. and continue statin   DVT prophylaxis with Lovenox subcutaneous. GI prophylaxis with PPI   Case discussed with Care Management/Social Worker. Management plans discussed with the patient, family and they are in agreement.  CODE STATUS: *Full*  DVT Prophylaxis: Lovenox  TOTAL TIME TAKING CARE OF THIS PATIENT: *30* minutes.  >50% time spent on counselling and coordination of care  POSSIBLE D/C IN one to 2 DAYS, DEPENDING ON CLINICAL CONDITION.  Note: This dictation was prepared with Dragon dictation along with smaller phrase technology. Any transcriptional errors that result from this process are unintentional.  Sadonna Kotara M.D on 08/10/2016 at 1:40 PM  Between 7am to 6pm - Pager - [phone]  After 6pm go to [email or web address] - password [email or web address] [facility]  Office  [phone]  CC: Primary care physician; Tracie Harrier, MD

## 2016-08-10 NOTE — Discharge Instructions (Signed)
Heart Failure Clinic appointment on Aug 18, 2016 at 11:20am with Peter [name], [place]. Please call [phone] to reschedule.

## 2016-08-11 LAB — CBC
HEMATOCRIT: 33 % — AB (ref 40.0–52.0)
HEMOGLOBIN: 10.7 g/dL — AB (ref 13.0–18.0)
MCH: 26.5 pg (ref 26.0–34.0)
MCHC: 32.5 g/dL (ref 32.0–36.0)
MCV: 81.5 fL (ref 80.0–100.0)
Platelets: 211 10*3/uL (ref 150–440)
RBC: 4.05 MIL/uL — ABNORMAL LOW (ref 4.40–5.90)
RDW: 15 % — ABNORMAL HIGH (ref 11.5–14.5)
WBC: 11.9 10*3/uL — ABNORMAL HIGH (ref 3.8–10.6)

## 2016-08-11 LAB — BASIC METABOLIC PANEL
Anion gap: 8 (ref 5–15)
BUN: 26 mg/dL — ABNORMAL HIGH (ref 6–20)
CO2: 27 mmol/L (ref 22–32)
Calcium: 8.5 mg/dL — ABNORMAL LOW (ref 8.9–10.3)
Chloride: 105 mmol/L (ref 101–111)
Creatinine, Ser: 1.53 mg/dL — ABNORMAL HIGH (ref 0.61–1.24)
GFR calc Af Amer: 49 mL/min — ABNORMAL LOW (ref >60)
GFR calc non Af Amer: 42 mL/min — ABNORMAL LOW (ref >60)
GLUCOSE: 153 mg/dL — AB (ref 65–99)
POTASSIUM: 4.1 mmol/L (ref 3.5–5.1)
Sodium: 140 mmol/L (ref 135–145)

## 2016-08-11 MED ORDER — CLOPIDOGREL BISULFATE 75 MG PO TABS: ORAL_TABLET | ORAL | 1 refills | Status: DC

## 2016-08-11 MED ORDER — FUROSEMIDE 20 MG PO TABS: 20.0000 mg | ORAL_TABLET | Freq: Every day | ORAL | 0 refills | Status: DC

## 2016-08-11 NOTE — Plan of Care (Signed)
Problem: Health Behavior/Discharge Planning: Goal: Ability to manage health-related needs will improve Outcome: Completed/Met Date Met: 08/11/16 Discharge teaching, CHF, med changes, followup

## 2016-08-11 NOTE — Discharge Summary (Signed)
[place] at [place] NAME: Peter [name]    MR#:  [id number]  DATE OF BIRTH:  06/18/1940  DATE OF ADMISSION:  08/07/2016 ADMITTING PHYSICIAN: Nicholes Mango, MD  DATE OF DISCHARGE: 08/11/2016  PRIMARY CARE PHYSICIAN: Tracie Harrier, MD    ADMISSION DIAGNOSIS:  Left bundle branch block [I44.7] Acute on chronic congestive heart failure, unspecified heart failure type ([place]) [I50.9]  DISCHARGE DIAGNOSIS:  Acute Systolic CHF CAD s/p Cath with DES in proximal in-stent stenosis  SECONDARY DIAGNOSIS:   Past Medical History:  Diagnosis Date  . Anginal pain (Peter [name])   . Asthma   . Bladder infection, acute 03/2011   "had a whole lot of bleeding from this"  . Coronary artery disease   . High cholesterol   . Hypertension   . 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 Myocardial infarction (Peter [name]) 1996  . NSTEMI (non-ST elevated myocardial infarction) ([name]) 12/14/2011  . Shortness of breath 12/14/2011   "@ rest, lying down, w/exertion"    HOSPITAL COURSE:  Peter [name] a 76 y.o. malewith a known history of Chronic congestive heart failure, coronary artery disease, hypertension, hyperlipidemia, macular degeneration and other medical problems sees Dr. Clayborn Bigness as an outpatient is presenting to the ED with a chief complaint of shortness of breath and worsening of his lower extremity swelling. Chest x-ray has revealed interstitial edema  #Acute respiratory distress secondary to acute systolic CHF exacerbation -Lasix 20 mg IV every 12 hours with potassium supplements as needed. --change to po daily lasix -Patient has had good urine output 6.2 liters since admission -Continue home medications aspirin, Plavix, Imdur, Lotrel and statin.  -Monitor renal function closely.  - Daily weight monitoring and intake and output.  - echocardiogram. Last echocardiogram in 2013 has revealed ejection fraction  55-60%. -Echo 2018 showed EF 35-40%.   #Elevated troponin with a new left bundle branch patient is asymptomatic with no chest pain -Cardiology has recommended no new interventions at this time. -Continue home medications aspirin, Plavix, Imdur, Lotrel, statin -cath showed in-stent stenosis status post DES and proximal LAD  #Essential hypertension - continue home medications Imdur, Lotrel. Holding hydrochlorothiazide as patient is on IV Lasix and renal function is slightly worse than his baseline.  #Hyperlipidemia check fasting lipid panel in a.m. and continue statin   DVT prophylaxis with Lovenox subcutaneous. GI prophylaxis with PPI  overall better D/c home  CONSULTS OBTAINED:  Treatment Team:  Peter Cowman, MD  DRUG ALLERGIES:   Allergies  Allergen Reactions  . Iodinated Diagnostic Agents Shortness Of Breath  . Iodinated Glycerol  [Glycerol, Iodinated] Shortness Of Breath    DISCHARGE MEDICATIONS:   Current Discharge Medication List    START taking these medications   Details  furosemide (LASIX) 20 MG tablet Take 1 tablet (20 mg total) by mouth daily. Qty: 30 tablet, Refills: 0      CONTINUE these medications which have CHANGED   Details  clopidogrel (PLAVIX) 75 MG tablet TAKE 1 TABLET(75 MG) BY MOUTH EVERY DAY Qty: 30 tablet, Refills: 1      CONTINUE these medications which have NOT CHANGED   Details  amLODipine-benazepril (LOTREL) 10-40 MG capsule TAKE 1 CAPSULE BY MOUTH EVERY DAY    aspirin 325 MG tablet Take 325 mg by mouth 2 (two) times daily. Patient takes if there is any pain or burning in the chest.    atorvastatin (LIPITOR) 40 MG tablet Take  1 tablet (40 mg total) by mouth daily at 6 PM. Qty: 30 tablet, Refills: 0    cetirizine (WAL-ZYR) 10 MG tablet Take 10 mg by mouth.    clobetasol cream (TEMOVATE) 0.05 % APPLY TO PSORIASIS BID PRN UNTIL SMOOTH Refills: 1    cyanocobalamin 1000 MCG tablet Take 100 mcg by mouth daily.     finasteride (PROSCAR) 5 MG tablet Take 1 tablet (5 mg total) by mouth daily. Qty: 30 tablet, Refills: 11   Associated Diagnoses: Benign prostatic hyperplasia with lower urinary tract symptoms, symptom details unspecified    GLUCOSAMINE-CHONDROITIN-VIT C PO Take 1 tablet by mouth daily.    hydrochlorothiazide (HYDRODIURIL) 12.5 MG tablet Take 12.5 mg by mouth daily.     isosorbide mononitrate (IMDUR) 30 MG 24 hr tablet Take 30 mg by mouth daily.    Multiple Vitamins-Minerals (ICAPS PO) Take 1 capsule by mouth 2 (two) times daily.     omeprazole (PRILOSEC) 20 MG capsule Take by mouth.    potassium chloride SA (K-DUR,KLOR-CON) 20 MEQ tablet TAKE 2 TABLETS BY MOUTH TWICE DAILY    tamsulosin (FLOMAX) 0.4 MG CAPS capsule TAKE 1 CAPSULE BY MOUTH EVERY DAY        If you experience worsening of your admission symptoms, develop shortness of breath, life threatening emergency, suicidal or homicidal thoughts you must seek medical attention immediately by calling 911 or calling your MD immediately  if symptoms less severe.  You Must read complete instructions/literature along with all the possible adverse reactions/side effects for all the Medicines you take and that have been prescribed to you. Take any new Medicines after you have completely understood and accept all the possible adverse reactions/side effects.   Please note  You were cared for by a hospitalist during your hospital stay. If you have any questions about your discharge medications or the care you received while you were in the hospital after you are discharged, you can call the unit and asked to speak with the hospitalist on call if the hospitalist that took care of you is not available. Once you are discharged, your primary care physician will handle any further medical issues. Please note that NO REFILLS for any discharge medications will be authorized once you are discharged, as it is imperative that you return to your primary  care physician (or establish a relationship with a primary care physician if you do not have one) for your aftercare needs so that they can reassess your need for medications and monitor your lab values. Today   SUBJECTIVE   Overall doing well. Wants to go home  VITAL SIGNS:  Blood pressure (!) 148/59, pulse 79, temperature 97.6 F (36.4 C), temperature source Oral, resp. rate 18, height 5\' 10"  (1.778 m), weight 100.1 kg (220 lb 11.2 oz), SpO2 97 %.  I/O:   Intake/Output Summary (Last 24 hours) at 08/11/16 1031 Last data filed at 08/11/16 0952  Gross per 24 hour  Intake              843 ml  Output             1600 ml  Net             -757 ml    PHYSICAL EXAMINATION:  GENERAL:  76 y.o.-year-old patient lying in the bed with no acute distress.  EYES: Pupils equal, round, reactive to light and accommodation. No scleral icterus. Extraocular muscles intact.  HEENT: Head atraumatic, normocephalic. Oropharynx and nasopharynx clear.  NECK:  Supple, no jugular venous distention. No thyroid enlargement, no tenderness.  LUNGS: Normal breath sounds bilaterally, no wheezing, rales,rhonchi or crepitation. No use of accessory muscles of respiration.  CARDIOVASCULAR: S1, S2 normal. No murmurs, rubs, or gallops.  ABDOMEN: Soft, non-tender, non-distended. Bowel sounds present. No organomegaly or mass.  EXTREMITIES: No pedal edema, cyanosis, or clubbing.  NEUROLOGIC: Cranial nerves II through XII are intact. Muscle strength 5/5 in all extremities. Sensation intact. Gait not checked.  PSYCHIATRIC: The patient is alert and oriented x 3.  SKIN: No obvious rash, lesion, or ulcer.   DATA REVIEW:   CBC   Recent Labs Lab 08/11/16 0339  WBC 11.9*  HGB 10.7*  HCT 33.0*  PLT 211    Chemistries   Recent Labs Lab 08/08/16 0121  08/11/16 0339  NA 142  < > 140  K 3.3*  < > 4.1  CL 106  < > 105  CO2 29  < > 27  GLUCOSE 107*  < > 153*  BUN 21*  < > 26*  CREATININE 1.45*  < > 1.53*  CALCIUM  8.1*  < > 8.5*  MG 2.0  --   --   < > = values in this interval not displayed.  Microbiology Results   No results found for this or any previous visit (from the past 240 hour(s)).  RADIOLOGY:  No results found.   Management plans discussed with the patient, family and they are in agreement.  CODE STATUS:     Code Status Orders        Start     Ordered   08/07/16 2035  Full code  Continuous     08/07/16 2034    Code Status History    Date Active Date Inactive Code Status Order ID Comments User Context   12/14/2011  1:48 PM 12/18/2011  2:30 PM Full Code [id number]  Erline Hau, MD Inpatient   04/07/2011  2:24 AM 04/07/2011  1:56 PM Full Code [id number]  Newton Pigg, RN Inpatient      TOTAL TIME TAKING CARE OF THIS PATIENT: *40* minutes.    Dicie Edelen M.D on 08/11/2016 at 10:31 AM  Between 7am to 6pm - Pager - [phone] After 6pm go to [email or web address] - password [email or web address] [facility]  Office  [phone]  CC: Primary care physician; Tracie Harrier, MD

## 2016-08-11 NOTE — Care Management Important Message (Signed)
Important Message  Patient Details  Name: LAMARKUS NEBEL MRN: [id number] Date of Birth: 1940-05-08   Medicare Important Message Given:  Yes  Signed IM notice given     Katrina Stack, RN 08/11/2016, 12:05 PM

## 2016-08-11 NOTE — Care Management Note (Signed)
Case Management Note  Patient Details  Name: Peter [name] MRN: [id number] Date of Birth: 09/14/1940  Subjective/Objective:                 Admitted with new diagnosis of congestive heart failure.  Not requiring supplemental oxygen.   Independent in all adls, denies issues accessing medical care, obtaining medications or with transportation.  Current with  PCP.   Action/Plan:  Referred to Heart Failure Clinic. Provided with heart failure education booklet.  Made referral to dietician.   Expected Discharge Date:  08/11/16               Expected Discharge Plan:    home  In-House Referral:   HF Clinic  Discharge planning Services     Post Acute Care Choice:    Choice offered to:     DME Arranged:    DME Agency:     HH Arranged:    HH Agency:     Status of Service:     If discussed at [employer], dates discussed:    Additional Comments:  Katrina Stack, RN 08/11/2016, 12:05 PM

## 2016-08-11 NOTE — Plan of Care (Signed)
Problem: Food- and Nutrition-Related Knowledge Deficit (NB-1.1) Goal: Nutrition education Formal process to instruct or train a patient/client in a skill or to impart knowledge to help patients/clients voluntarily manage or modify food choices and eating behavior to maintain or improve health. Outcome: Completed/Met Date Met: 08/11/16 Nutrition Education Note  RD consulted for nutrition education regarding  CHF.  RD provided "Heart Failure Nutrition Therapy" handout from the Academy of [employer]etics. Reviewed patient's dietary recall. Provided examples on ways to decrease sodium intake in diet. Discouraged intake of processed foods and use of salt shaker. Encouraged fresh fruits and vegetables as well as whole grain sources of carbohydrates to maximize fiber intake.   RD discussed why it is important for patient to adhere to diet recommendations, and emphasized the role of fluids, foods to avoid, and importance of weighing self daily. Teach back method used.  Expect good compliance. Pt very receptive to [employer] mass index is 31.67 kg/m. Pt meets criteria for obesity unspecified based on current BMI.  Pt to be discharged home today.  Kerman Passey [place], [place], LDN [phone] Pager  [phone] Weekend/On-Call Pager

## 2016-08-11 NOTE — Progress Notes (Signed)
[facility] Cardiology  SUBJECTIVE: Patient states he feels "great." He denies chest pain. He states his breathing is better.   Vitals:   08/10/16 1350 08/10/16 1920 08/11/16 0338 08/11/16 0500  BP: (!) 156/81 (!) 145/56 (!) 163/70   Pulse: 85 81 85   Resp: 16 18 18    Temp: 97.8 F (36.6 C) 98.5 F (36.9 C) 98.2 F (36.8 C)   TempSrc: Oral Oral Oral   SpO2: 93% 95% 93%   Weight:    100.1 kg (220 lb 11.2 oz)  Height:         Intake/Output Summary (Last 24 hours) at 08/11/16 4854 Last data filed at 08/11/16 0301  Gross per 24 hour  Intake              363 ml  Output             1600 ml  Net            -1237 ml      PHYSICAL EXAM  General: Well developed, well nourished, in no acute distress HEENT:  Normocephalic and atramatic Neck:  No JVD.  Lungs: Clear bilaterally to auscultation Heart: Irregularly irregular. Normal S1 and S2 without gallops or murmurs.  Abdomen: Bowel sounds are positive, abdomen soft and non-tender  Msk:  Back normal, normal gait. Normal strength and tone for age. Extremities: No clubbing, cyanosis or edema.   Neuro: Alert and oriented X 3. Psych:  Good affect, responds appropriately   LABS: Basic Metabolic Panel:  Recent Labs  08/09/16 0810 08/11/16 0339  NA 139 140  K 3.6 4.1  CL 102 105  CO2 30 27  GLUCOSE 133* 153*  BUN 18 26*  CREATININE 1.34* 1.53*  CALCIUM 8.8* 8.5*   Liver Function Tests: No results for input(s): AST, ALT, ALKPHOS, BILITOT, PROT, ALBUMIN in the last 72 hours. No results for input(s): LIPASE, AMYLASE in the last 72 hours. CBC:  Recent Labs  08/11/16 0339  WBC 11.9*  HGB 10.7*  HCT 33.0*  MCV 81.5  PLT 211   Cardiac Enzymes: No results for input(s): CKTOTAL, CKMB, CKMBINDEX, TROPONINI in the last 72 hours. BNP: Invalid input(s): POCBNP D-Dimer: No results for input(s): DDIMER in the last 72 hours. Hemoglobin A1C: No results for input(s): HGBA1C in the last 72 hours. Fasting Lipid Panel: No results for  input(s): CHOL, HDL, LDLCALC, TRIG, CHOLHDL, LDLDIRECT in the last 72 hours. Thyroid Function Tests: No results for input(s): TSH, T4TOTAL, T3FREE, THYROIDAB in the last 72 hours.  Invalid input(s): FREET3 Anemia Panel: No results for input(s): VITAMINB12, FOLATE, FERRITIN, TIBC, IRON, RETICCTPCT in the last 72 hours.  No results found.   Echo 35-40%  TELEMETRY: Normal sinus rhythm, rate 68, with intermittent irregular irregular rhythm  ASSESSMENT AND PLAN:  Active Problems:   Acute CHF (congestive heart failure) ([place])    1. Borderline elevated troponin, in the setting of acute on chronic congestive heart failure, with new LBBB 2. 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 Acute on chronic systolic congestive heart failure, clinically improved 4. Intermittent atrial fibrillation, rate controlled, noted on telemetry   Recommendations: 1. Continue current medications. 2. Continue DAPT therapy with aspirin and Plavix 3. Continue dieresis with careful monitoring of renal status 4. Plan to discharge with close follow-up with Dr. Clayborn Bigness as outpatient next week. Recommend Holter monitor for evaluation of atrial fibrillation   Clabe Seal, PA-C 08/11/2016 8:06 AM

## 2016-08-11 NOTE — Progress Notes (Signed)
Discharged to home with friend.  Instructions given regarding angio site care, follow up appointments and medication changes.

## 2016-08-17 DIAGNOSIS — G4733 Obstructive sleep apnea (adult) (pediatric): Secondary | ICD-10-CM | POA: Diagnosis not present

## 2016-08-17 DIAGNOSIS — R0609 Other forms of dyspnea: Secondary | ICD-10-CM | POA: Diagnosis not present

## 2016-08-17 DIAGNOSIS — Z955 Presence of coronary angioplasty implant and graft: Secondary | ICD-10-CM | POA: Diagnosis not present

## 2016-08-17 DIAGNOSIS — I2510 Atherosclerotic heart disease of native coronary artery without angina pectoris: Secondary | ICD-10-CM | POA: Diagnosis not present

## 2016-08-17 DIAGNOSIS — E669 Obesity, unspecified: Secondary | ICD-10-CM | POA: Diagnosis not present

## 2016-08-17 DIAGNOSIS — I5023 Acute on chronic systolic (congestive) heart failure: Secondary | ICD-10-CM | POA: Diagnosis not present

## 2016-08-17 DIAGNOSIS — R002 Palpitations: Secondary | ICD-10-CM | POA: Diagnosis not present

## 2016-08-17 DIAGNOSIS — I10 Essential (primary) hypertension: Secondary | ICD-10-CM | POA: Diagnosis not present

## 2016-08-17 DIAGNOSIS — I200 Unstable angina: Secondary | ICD-10-CM | POA: Diagnosis not present

## 2016-08-17 DIAGNOSIS — R0602 Shortness of breath: Secondary | ICD-10-CM | POA: Diagnosis not present

## 2016-08-18 ENCOUNTER — Encounter: Payer: Self-pay | Admitting: Family

## 2016-08-18 ENCOUNTER — Ambulatory Visit: Payer: PPO | Attending: Family | Admitting: Family

## 2016-08-18 VITALS — BP 156/61 | HR 76 | Resp 20 | Ht 70.0 in | Wt 223.5 lb

## 2016-08-18 DIAGNOSIS — I110 Hypertensive heart disease with heart failure: Secondary | ICD-10-CM | POA: Diagnosis not present

## 2016-08-18 DIAGNOSIS — I2510 Atherosclerotic heart disease of native coronary artery without angina pectoris: Secondary | ICD-10-CM | POA: Diagnosis not present

## 2016-08-18 DIAGNOSIS — D649 Anemia, unspecified: Secondary | ICD-10-CM | POA: Diagnosis not present

## 2016-08-18 DIAGNOSIS — Z9889 Other specified postprocedural states: Secondary | ICD-10-CM | POA: Diagnosis not present

## 2016-08-18 DIAGNOSIS — Z720 Tobacco use: Secondary | ICD-10-CM | POA: Diagnosis not present

## 2016-08-18 DIAGNOSIS — F17210 Nicotine dependence, cigarettes, uncomplicated: Secondary | ICD-10-CM | POA: Diagnosis not present

## 2016-08-18 DIAGNOSIS — Z79899 Other long term (current) drug therapy: Secondary | ICD-10-CM | POA: Insufficient documentation

## 2016-08-18 DIAGNOSIS — I10 Essential (primary) hypertension: Secondary | ICD-10-CM

## 2016-08-18 DIAGNOSIS — Z955 Presence of coronary angioplasty implant and graft: Secondary | ICD-10-CM | POA: Insufficient documentation

## 2016-08-18 DIAGNOSIS — I252 Old myocardial infarction: Secondary | ICD-10-CM | POA: Insufficient documentation

## 2016-08-18 DIAGNOSIS — Z09 Encounter for follow-up examination after completed treatment for conditions other than malignant neoplasm: Secondary | ICD-10-CM | POA: Diagnosis not present

## 2016-08-18 DIAGNOSIS — Z7982 Long term (current) use of aspirin: Secondary | ICD-10-CM | POA: Diagnosis not present

## 2016-08-18 DIAGNOSIS — I5022 Chronic systolic (congestive) heart failure: Secondary | ICD-10-CM | POA: Diagnosis not present

## 2016-08-18 NOTE — Patient Instructions (Signed)
Continue weighing daily and call for an overnight weight gain of > 2 pounds or a weekly weight gain of >5 pounds. 

## 2016-08-18 NOTE — Progress Notes (Signed)
Patient ID: KAEDAN RICHERT, male    DOB: 1940/09/09, 76 y.o.   MRN: [id number]  HPI  Mr Adduci is a 76 y/o male with a history of asthma, CAD, hyperlipidemia, HTN, hypokalemia, MI, NSTEMI, multiple stents, current tobacco use and chronic heart failure.   Reviewed last echo report on 08/08/16 which showed an EF of 35-40% along with mild AR and moderate MR/TR. EF has declined from a previous echo done 12/15/11. Cardiac catheterization done 08/10/16 showed one vessel CAD with high-grade in stent restenosis in the proximal LAD. DES successfully placed in proximal LAD.  Admitted 08/07/16 with acute HF along with CAD. Initially treated with IV diuretics and transitioned to oral diuretics. Lost ~ 6.2L of fluid during hospitalization. Cardiology consult obtained and catheterization done. Discharged home after 4 days.   He presents today for his initial visit with a chief complaint of chronic shortness of breath. He describes it as moderate in nature occurring with minimal exertion. He says that it's been present for several months. He has associated fatigue, cough and pedal edema with this.  Past Medical History:  Diagnosis Date  . Anginal pain ([place])   . Asthma   . Bladder infection, acute 03/2011   "had a whole lot of bleeding from this"  . Coronary artery disease   . High cholesterol   . Hypertension   . 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 Myocardial infarction ([place]) 1996  . NSTEMI (non-ST elevated myocardial infarction) ([place]) 12/14/2011  . Shortness of breath 12/14/2011   "@ rest, lying down, w/exertion"   Past Surgical History:  Procedure Laterality Date  . [place]; ~ 2003; ~ 2008   "total of 3"  . CORONARY STENT INTERVENTION N/A 08/10/2016   Procedure: Coronary Stent Intervention;  Surgeon: [name], MD;  Location: [facility];  Service: Cardiovascular;  Laterality: N/A;  . HERNIA  REPAIR  ~ 2001   "abdominal w/mesh implanted"  . LEFT HEART CATH AND CORONARY ANGIOGRAPHY N/A 08/10/2016   Procedure: Left Heart Cath and Coronary Angiography;  Surgeon: [name], MD;  Location: [facility];  Service: Cardiovascular;  Laterality: N/A;  . LEFT HEART CATHETERIZATION WITH CORONARY ANGIOGRAM N/A 12/16/2011   Procedure: LEFT HEART CATHETERIZATION WITH CORONARY ANGIOGRAM;  Surgeon: Minus Breeding, MD;  Location: [facility] CATH LAB;  Service: Cardiovascular;  Laterality: N/A;  . PERCUTANEOUS CORONARY STENT INTERVENTION (PCI-S) N/A 12/17/2011   Procedure: PERCUTANEOUS CORONARY STENT INTERVENTION (PCI-S);  Surgeon: Sherren Mocha, MD;  Location: [facility] CATH LAB;  Service: Cardiovascular;  Laterality: N/A;  . TONSILLECTOMY     "I was a kid"   Family History  Problem Relation Age of Onset  . Family history unknown: Yes   Social History  Substance Use Topics  . Smoking status: Current Every Day Smoker    Packs/day: 1.00    Years: 53.00    Types: Cigarettes  . Smokeless tobacco: Never Used  . Alcohol use 0.6 oz/week    1 Cans of beer per week     Comment: 12/14/2011 "if my kidneys are sore, I drink 1 beer/day; if not sore; no beer; occasionally drink socially; ave 1 beer/wk maybe"   Allergies  Allergen Reactions  . Iodinated Diagnostic Agents Shortness Of Breath  . Iodinated Glycerol  [Glycerol, Iodinated] Shortness Of Breath   Prior to Admission medications   Medication Sig Start Date End Date Taking? Authorizing  Provider  amLODipine-benazepril (LOTREL) 10-40 MG capsule TAKE 1 CAPSULE BY MOUTH EVERY DAY 10/21/15  Yes [provider]  aspirin 325 MG tablet Take 325 mg by mouth 2 (two) times daily. Patient takes if there is any pain or burning in the chest.   Yes [provider]  atorvastatin (LIPITOR) 40 MG tablet Take 1 tablet (40 mg total) by mouth daily at 6 PM. 12/18/11  Yes Annita Brod, MD  carvedilol (COREG) 3.125 MG tablet Take 3.125 mg by mouth  2 (two) times daily with a meal.   Yes [provider]  cetirizine (WAL-ZYR) 10 MG tablet Take 10 mg by mouth.   Yes [provider]  clobetasol cream (TEMOVATE) 0.05 % APPLY TO PSORIASIS BID PRN UNTIL SMOOTH 11/05/14  Yes [provider]  clopidogrel (PLAVIX) 75 MG tablet TAKE 1 TABLET(75 MG) BY MOUTH EVERY DAY 08/11/16  Yes Fritzi Mandes, MD  cyanocobalamin 1000 MCG tablet Take 1,000 mcg by mouth daily.    Yes [provider]  furosemide (LASIX) 20 MG tablet Take 1 tablet (20 mg total) by mouth daily. 08/12/16  Yes Fritzi Mandes, MD  GLUCOSAMINE-CHONDROITIN-VIT C PO Take 1 tablet by mouth daily.   Yes [provider]  hydrochlorothiazide (HYDRODIURIL) 12.5 MG tablet Take 12.5 mg by mouth daily.  04/22/15  Yes [provider]  isosorbide mononitrate (IMDUR) 30 MG 24 hr tablet Take 30 mg by mouth daily. 06/18/16  Yes [provider]  omeprazole (PRILOSEC) 20 MG capsule Take by mouth. 01/16/16  Yes [provider]  potassium chloride SA (K-DUR,KLOR-CON) 20 MEQ tablet TAKE 2 TABLETS BY MOUTH TWICE DAILY 12/13/15  Yes [provider]  tamsulosin (FLOMAX) 0.4 MG CAPS capsule TAKE 1 CAPSULE BY MOUTH EVERY DAY 10/28/15  Yes [provider]   Review of Systems  Constitutional: Positive for fatigue. Negative for appetite change.  HENT: Negative for congestion, postnasal drip and sore throat.   Eyes: Negative.   Respiratory: Positive for cough and shortness of breath. Negative for chest tightness.   Cardiovascular: Positive for leg swelling. Negative for chest pain and palpitations.  Gastrointestinal: Negative for abdominal distention and abdominal pain.  Endocrine: Negative.   Genitourinary: Negative.   Musculoskeletal: Negative for back pain and neck pain.  Skin: Negative.   Allergic/Immunologic: Negative.   Neurological: Negative for dizziness and light-headedness.  Hematological: Negative for adenopathy. Bruises/bleeds  easily.  Psychiatric/Behavioral: Positive for sleep disturbance (sleeping on 2 pillow). Negative for dysphoric mood. The patient is not nervous/anxious.    Vitals:   08/18/16 1107  BP: (!) 156/61  Pulse: 76  Resp: 20  SpO2: 98%  Weight: 223 lb 8 oz (101.4 kg)  Height: 5\' 10"  (1.778 m)   Wt Readings from Last 3 Encounters:  08/18/16 223 lb 8 oz (101.4 kg)  08/11/16 220 lb 11.2 oz (100.1 kg)  01/23/16 225 lb 3.2 oz (102.2 kg)   Lab Results  Component Value Date   CREATININE 1.53 (H) 08/11/2016   CREATININE 1.34 (H) 08/09/2016   CREATININE 1.45 (H) 08/08/2016    Physical Exam  Constitutional: He is oriented to person, place, and time. He appears well-developed and well-nourished.  HENT:  Head: Normocephalic and atraumatic.  Neck: Normal range of motion. Neck supple. No JVD present.  Cardiovascular: Normal rate and regular rhythm.   Pulmonary/Chest: Effort normal. He has no wheezes. He has no rales.  Abdominal: Soft. He exhibits no distension. There is no tenderness.  Musculoskeletal: He exhibits edema (trace  pitting edema in bilateral lower legs). He exhibits no tenderness.  Neurological: He is alert and oriented to person, place, and time.  Skin: Skin is warm and dry.  Psychiatric: He has a normal mood and affect. His behavior is normal. Thought content normal.  Nursing note and vitals reviewed.   Assessment & Plan:  1: Chronic heart failure with reduced ejection fraction- - NYHA class III - euvolemic today - weighing daily. Instructed to call for an overnight weight gain of >2 pounds or a weekly weight gain of >5 pounds - not adding salt and has been reading food labels. Reviewed the importance of closely following a 2000mg  sodium diet and written dietary information was given to him. Low sodium cookbook given to him as well. - was started on carvedilol yesterday by his cardiologist per patient's report  - could consider changing his lotrel to entresto at future  visits - saw cardiologist Clayborn Bigness) yesterday and returns 09/10/16 - sees Dr. Saralyn Pilar 08/19/16 for f/u stent placement  2: HTN- - BP looks good today - sees PCP (Hande) later today  3: Tobacco use- - currently smoking 1 ppd of cigarettes  - complete cessation discussed for 3 minutes with him  Medication list was reviewed.  Return in 1 month or sooner for any questions/problems before then.

## 2016-08-19 DIAGNOSIS — Z86010 Personal history of colonic polyps: Secondary | ICD-10-CM | POA: Diagnosis not present

## 2016-08-19 DIAGNOSIS — Z7902 Long term (current) use of antithrombotics/antiplatelets: Secondary | ICD-10-CM | POA: Diagnosis not present

## 2016-09-07 ENCOUNTER — Inpatient Hospital Stay
Admission: EM | Admit: 2016-09-07 | Discharge: 2016-09-10 | DRG: 292 | Disposition: A | Discharge status: Home / Self Care | Payer: PPO | Attending: Internal Medicine | Admitting: Internal Medicine

## 2016-09-07 ENCOUNTER — Encounter: Payer: Self-pay | Admitting: *Deleted

## 2016-09-07 ENCOUNTER — Emergency Department: Payer: PPO

## 2016-09-07 DIAGNOSIS — E876 Hypokalemia: Secondary | ICD-10-CM | POA: Diagnosis present

## 2016-09-07 DIAGNOSIS — F17210 Nicotine dependence, cigarettes, uncomplicated: Secondary | ICD-10-CM | POA: Diagnosis not present

## 2016-09-07 DIAGNOSIS — I509 Heart failure, unspecified: Secondary | ICD-10-CM | POA: Diagnosis not present

## 2016-09-07 DIAGNOSIS — I5023 Acute on chronic systolic (congestive) heart failure: Secondary | ICD-10-CM | POA: Diagnosis not present

## 2016-09-07 DIAGNOSIS — K219 Gastro-esophageal reflux disease without esophagitis: Secondary | ICD-10-CM | POA: Diagnosis present

## 2016-09-07 DIAGNOSIS — R0609 Other forms of dyspnea: Secondary | ICD-10-CM | POA: Diagnosis not present

## 2016-09-07 DIAGNOSIS — Z955 Presence of coronary angioplasty implant and graft: Secondary | ICD-10-CM | POA: Diagnosis not present

## 2016-09-07 DIAGNOSIS — D649 Anemia, unspecified: Secondary | ICD-10-CM | POA: Diagnosis present

## 2016-09-07 DIAGNOSIS — I2510 Atherosclerotic heart disease of native coronary artery without angina pectoris: Secondary | ICD-10-CM | POA: Diagnosis present

## 2016-09-07 DIAGNOSIS — I447 Left bundle-branch block, unspecified: Secondary | ICD-10-CM | POA: Diagnosis not present

## 2016-09-07 DIAGNOSIS — I252 Old myocardial infarction: Secondary | ICD-10-CM | POA: Diagnosis not present

## 2016-09-07 DIAGNOSIS — N400 Enlarged prostate without lower urinary tract symptoms: Secondary | ICD-10-CM | POA: Diagnosis not present

## 2016-09-07 DIAGNOSIS — E785 Hyperlipidemia, unspecified: Secondary | ICD-10-CM | POA: Diagnosis not present

## 2016-09-07 DIAGNOSIS — Z7982 Long term (current) use of aspirin: Secondary | ICD-10-CM | POA: Diagnosis not present

## 2016-09-07 DIAGNOSIS — E871 Hypo-osmolality and hyponatremia: Secondary | ICD-10-CM | POA: Diagnosis present

## 2016-09-07 DIAGNOSIS — I5021 Acute systolic (congestive) heart failure: Secondary | ICD-10-CM | POA: Diagnosis not present

## 2016-09-07 DIAGNOSIS — Z79899 Other long term (current) drug therapy: Secondary | ICD-10-CM

## 2016-09-07 DIAGNOSIS — I110 Hypertensive heart disease with heart failure: Secondary | ICD-10-CM | POA: Diagnosis not present

## 2016-09-07 DIAGNOSIS — I10 Essential (primary) hypertension: Secondary | ICD-10-CM | POA: Diagnosis not present

## 2016-09-07 DIAGNOSIS — J45909 Unspecified asthma, uncomplicated: Secondary | ICD-10-CM | POA: Diagnosis present

## 2016-09-07 DIAGNOSIS — R0602 Shortness of breath: Secondary | ICD-10-CM | POA: Diagnosis not present

## 2016-09-07 LAB — CBC
HEMATOCRIT: 33.3 % — AB (ref 40.0–52.0)
HEMOGLOBIN: 11 g/dL — AB (ref 13.0–18.0)
MCH: 26.6 pg (ref 26.0–34.0)
MCHC: 33 g/dL (ref 32.0–36.0)
MCV: 80.6 fL (ref 80.0–100.0)
Platelets: 166 10*3/uL (ref 150–440)
RBC: 4.13 MIL/uL — AB (ref 4.40–5.90)
RDW: 16.5 % — ABNORMAL HIGH (ref 11.5–14.5)
WBC: 6.4 10*3/uL (ref 3.8–10.6)

## 2016-09-07 LAB — BASIC METABOLIC PANEL
ANION GAP: 7 (ref 5–15)
BUN: 19 mg/dL (ref 6–20)
CO2: 27 mmol/L (ref 22–32)
Calcium: 8.4 mg/dL — ABNORMAL LOW (ref 8.9–10.3)
Chloride: 100 mmol/L — ABNORMAL LOW (ref 101–111)
Creatinine, Ser: 1.12 mg/dL (ref 0.61–1.24)
GLUCOSE: 107 mg/dL — AB (ref 65–99)
POTASSIUM: 4 mmol/L (ref 3.5–5.1)
Sodium: 134 mmol/L — ABNORMAL LOW (ref 135–145)

## 2016-09-07 LAB — BRAIN NATRIURETIC PEPTIDE: B Natriuretic Peptide: 909 pg/mL — ABNORMAL HIGH (ref 0.0–100.0)

## 2016-09-07 LAB — TROPONIN I: Troponin I: 0.05 ng/mL (ref <0.03)

## 2016-09-07 MED ORDER — SODIUM CHLORIDE 0.9 % IV SOLN: 250.0000 mL | INTRAVENOUS | Status: DC | PRN

## 2016-09-07 MED ORDER — CLOBETASOL PROPIONATE 0.05 % EX CREA
TOPICAL_CREAM | Freq: Two times a day (BID) | CUTANEOUS | Status: DC
  Filled 2016-09-07: qty 15

## 2016-09-07 MED ORDER — ASPIRIN 81 MG PO CHEW
324.0000 mg | CHEWABLE_TABLET | Freq: Once | ORAL | Status: AC
  Administered 2016-09-07: 324 mg via ORAL
  Filled 2016-09-07: qty 4

## 2016-09-07 MED ORDER — AMLODIPINE BESYLATE 10 MG PO TABS
10.0000 mg | ORAL_TABLET | Freq: Every day | ORAL | Status: DC
  Administered 2016-09-08 – 2016-09-10 (×3): 10 mg via ORAL
  Filled 2016-09-07 (×3): qty 1

## 2016-09-07 MED ORDER — ALBUTEROL SULFATE (2.5 MG/3ML) 0.083% IN NEBU
5.0000 mg | INHALATION_SOLUTION | Freq: Once | RESPIRATORY_TRACT | Status: AC
  Administered 2016-09-07: 5 mg via RESPIRATORY_TRACT
  Filled 2016-09-07: qty 6

## 2016-09-07 MED ORDER — CLOPIDOGREL BISULFATE 75 MG PO TABS
75.0000 mg | ORAL_TABLET | Freq: Every day | ORAL | Status: DC
  Administered 2016-09-08 – 2016-09-10 (×3): 75 mg via ORAL
  Filled 2016-09-07 (×3): qty 1

## 2016-09-07 MED ORDER — BENAZEPRIL HCL 20 MG PO TABS
40.0000 mg | ORAL_TABLET | Freq: Every day | ORAL | Status: DC
  Administered 2016-09-08 – 2016-09-10 (×3): 40 mg via ORAL
  Filled 2016-09-07 (×3): qty 2

## 2016-09-07 MED ORDER — LORATADINE 10 MG PO TABS
10.0000 mg | ORAL_TABLET | Freq: Every day | ORAL | Status: DC
  Administered 2016-09-08 – 2016-09-10 (×3): 10 mg via ORAL
  Filled 2016-09-07 (×3): qty 1

## 2016-09-07 MED ORDER — POTASSIUM CHLORIDE CRYS ER 20 MEQ PO TBCR
40.0000 meq | EXTENDED_RELEASE_TABLET | Freq: Two times a day (BID) | ORAL | Status: DC
  Administered 2016-09-07 – 2016-09-10 (×6): 40 meq via ORAL
  Filled 2016-09-07 (×6): qty 2

## 2016-09-07 MED ORDER — ATORVASTATIN CALCIUM 20 MG PO TABS
40.0000 mg | ORAL_TABLET | Freq: Every day | ORAL | Status: DC
  Administered 2016-09-08 – 2016-09-09 (×2): 40 mg via ORAL
  Filled 2016-09-07 (×2): qty 2

## 2016-09-07 MED ORDER — HYDROCHLOROTHIAZIDE 12.5 MG PO CAPS
12.5000 mg | ORAL_CAPSULE | Freq: Every day | ORAL | Status: DC
  Administered 2016-09-08 – 2016-09-10 (×3): 12.5 mg via ORAL
  Filled 2016-09-07 (×3): qty 1

## 2016-09-07 MED ORDER — ASPIRIN 325 MG PO TABS
325.0000 mg | ORAL_TABLET | Freq: Two times a day (BID) | ORAL | Status: DC
  Administered 2016-09-08 – 2016-09-10 (×5): 325 mg via ORAL
  Filled 2016-09-07 (×5): qty 1

## 2016-09-07 MED ORDER — TAMSULOSIN HCL 0.4 MG PO CAPS
0.4000 mg | ORAL_CAPSULE | Freq: Every day | ORAL | Status: DC
  Administered 2016-09-08 – 2016-09-10 (×3): 0.4 mg via ORAL
  Filled 2016-09-07 (×3): qty 1

## 2016-09-07 MED ORDER — FUROSEMIDE 10 MG/ML IJ SOLN
40.0000 mg | Freq: Once | INTRAMUSCULAR | Status: AC
  Administered 2016-09-07: 40 mg via INTRAVENOUS
  Filled 2016-09-07: qty 4

## 2016-09-07 MED ORDER — PANTOPRAZOLE SODIUM 40 MG PO TBEC
40.0000 mg | DELAYED_RELEASE_TABLET | Freq: Every day | ORAL | Status: DC
  Administered 2016-09-08 – 2016-09-10 (×3): 40 mg via ORAL
  Filled 2016-09-07 (×3): qty 1

## 2016-09-07 MED ORDER — FUROSEMIDE 10 MG/ML IJ SOLN
40.0000 mg | Freq: Every day | INTRAMUSCULAR | Status: DC
  Administered 2016-09-08 – 2016-09-10 (×3): 40 mg via INTRAVENOUS
  Filled 2016-09-07 (×3): qty 4

## 2016-09-07 MED ORDER — SODIUM CHLORIDE 0.9% FLUSH
3.0000 mL | Freq: Two times a day (BID) | INTRAVENOUS | Status: DC
  Administered 2016-09-07 – 2016-09-10 (×6): 3 mL via INTRAVENOUS

## 2016-09-07 MED ORDER — ACETAMINOPHEN 325 MG PO TABS: 650.0000 mg | ORAL_TABLET | ORAL | Status: DC | PRN

## 2016-09-07 MED ORDER — ONDANSETRON HCL 4 MG/2ML IJ SOLN: 4.0000 mg | Freq: Four times a day (QID) | INTRAMUSCULAR | Status: DC | PRN

## 2016-09-07 MED ORDER — CARVEDILOL 3.125 MG PO TABS
3.1250 mg | ORAL_TABLET | Freq: Two times a day (BID) | ORAL | Status: DC
  Administered 2016-09-08 – 2016-09-10 (×5): 3.125 mg via ORAL
  Filled 2016-09-07 (×5): qty 1

## 2016-09-07 MED ORDER — ISOSORBIDE MONONITRATE ER 30 MG PO TB24
30.0000 mg | ORAL_TABLET | Freq: Every day | ORAL | Status: DC
  Administered 2016-09-08 – 2016-09-10 (×3): 30 mg via ORAL
  Filled 2016-09-07 (×3): qty 1

## 2016-09-07 MED ORDER — ENOXAPARIN SODIUM 40 MG/0.4ML ~~LOC~~ SOLN
40.0000 mg | SUBCUTANEOUS | Status: DC
  Administered 2016-09-08 – 2016-09-09 (×2): 40 mg via SUBCUTANEOUS
  Filled 2016-09-07 (×2): qty 0.4

## 2016-09-07 MED ORDER — SODIUM CHLORIDE 0.9% FLUSH
3.0000 mL | INTRAVENOUS | Status: DC | PRN
Start: 2016-09-07 — End: 2016-09-10

## 2016-09-07 MED ORDER — VITAMIN B-12 1000 MCG PO TABS
1000.0000 ug | ORAL_TABLET | Freq: Every day | ORAL | Status: DC
  Administered 2016-09-08 – 2016-09-10 (×3): 1000 ug via ORAL
  Filled 2016-09-07 (×3): qty 1

## 2016-09-07 NOTE — H&P (Signed)
History and Physical   [facility] - [place] @ [facility] Admission History and Physical [employer], D.O.    Patient Name: Peter [name] MR#: [id number] Date of Birth: [birth year] Date of Admission: 09/07/2016  Referring MD/NP/PA: Dr. Burlene Arnt Primary Care Physician: Tracie Harrier, MD Patient coming from: Home Outpatient Specialists: Dr. Clayborn Bigness   Chief Complaint:  Chief Complaint  Patient presents with  . Shortness of Breath    HPI: Peter [name] is a 75 y.o. male with a known history of Hypertension, hyperlipidemia, asthma,anemia, BPH,  Coronary artery disease status post restenting of an occluded LAD last month during an admission for acute exacerbation of chronic systolic congestive heart failure presents to the emergency department for evaluation of shortness of breath.  Patient was in a usual state of health until today when he describes progressively worsening shortness of breath, orthopnea and bilateral lower extremity edema. Also has had several pound weight gain. He admits to dietary indiscretion but had been Compliant with medications which includes a recent addition of Coreg  Patient denies fevers/chills, weakness, dizziness, chest pain, N/V/C/D, abdominal pain, dysuria/frequency, changes in mental status.    Of note patient was admitted in April 2018 with a diagnosis of acute exacerbation of chronic systolic congestive heart failure with EF 35-40%. At that time was found to have a restenosis of an LAD stent which was restented at the time. Otherwise there has been no change in status. Patient has been taking medication as prescribed and there has been no recent change in medication or diet.  No recent antibiotics.  There has been no other recent illness, hospitalizations, travel or sick contacts.    EMS/ED Course: Patient received aspirin 325, Lasix 40 IV and albuterol nebulizer.  Review of Systems:  CONSTITUTIONAL: No fever/chills, fatigue, weakness, weight  gain/loss, headache. EYES: No blurry or double vision. ENT: No tinnitus, postnasal drip, redness or soreness of the oropharynx. RESPIRATORY: Positive cough, dyspnea, wheeze.  No hemoptysis.  CARDIOVASCULAR: No chest pain, palpitations, syncope, orthopnea. Positive lower extremity edema.  GASTROINTESTINAL: No nausea, vomiting, abdominal pain, diarrhea, constipation.  No hematemesis, melena or hematochezia. GENITOURINARY: No dysuria, frequency, hematuria. ENDOCRINE: No polyuria or nocturia. No heat or cold intolerance. HEMATOLOGY: No anemia, bruising, bleeding. INTEGUMENTARY: No rashes, ulcers, lesions. MUSCULOSKELETAL: No arthritis, gout, dyspnea. NEUROLOGIC: No numbness, tingling, ataxia, seizure-type activity, weakness. PSYCHIATRIC: No anxiety, depression, insomnia.   Past Medical History:  Diagnosis Date  . Anginal pain ([place])   . Asthma   . Bladder infection, acute 03/2011   "had a whole lot of bleeding from this"  . Coronary artery disease   . High cholesterol   . Hypertension   . 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 Myocardial infarction ([place]) 1996  . NSTEMI (non-ST elevated myocardial infarction) ([place]) 12/14/2011  . Shortness of breath 12/14/2011   "@ rest, lying down, w/exertion"    Past Surgical History:  Procedure Laterality Date  . [place]; ~ 2003; ~ 2008   "total of 3"  . CORONARY STENT INTERVENTION N/A 08/10/2016   Procedure: Coronary Stent Intervention;  Surgeon: [name], MD;  Location: [place] [facility];  Service: Cardiovascular;  Laterality: N/A;  . HERNIA REPAIR  ~ 2001   "abdominal w/mesh implanted"  . LEFT HEART CATH AND CORONARY ANGIOGRAPHY N/A 08/10/2016   Procedure: Left Heart Cath and Coronary Angiography;  Surgeon: [name], MD;  Location: [place] [facility];  Service: Cardiovascular;  Laterality: N/A;  . LEFT HEART CATHETERIZATION WITH  CORONARY ANGIOGRAM N/A 12/16/2011   Procedure: LEFT HEART CATHETERIZATION WITH CORONARY ANGIOGRAM;  Surgeon: Minus Breeding, MD;  Location: [facility] CATH LAB;  Service: Cardiovascular;  Laterality: N/A;  . PERCUTANEOUS CORONARY STENT INTERVENTION (PCI-S) N/A 12/17/2011   Procedure: PERCUTANEOUS CORONARY STENT INTERVENTION (PCI-S);  Surgeon: Sherren Mocha, MD;  Location: [facility] CATH LAB;  Service: Cardiovascular;  Laterality: N/A;  . TONSILLECTOMY     "I was a kid"     reports that he has been smoking Cigarettes.  He has a 53.00 pack-year smoking history. He has never used smokeless tobacco. He reports that he drinks about 0.6 oz of alcohol per week . He reports that he does not use drugs.  Allergies  Allergen Reactions  . Iodinated Diagnostic Agents Shortness Of Breath  . Iodinated Glycerol  [Glycerol, Iodinated] Shortness Of Breath    Family History   Medical History Relation Name Comments  Aneurysm Brother  aortic, too small to fix  Macular degeneration Brother    Heart disease Father    Hyperlipidemia Father    Hypertension Father    Cancer Mother  breast cancer    Prior to Admission medications   Medication Sig Start Date End Date Taking? Authorizing Provider  amLODipine-benazepril (LOTREL) 10-40 MG capsule TAKE 1 CAPSULE BY MOUTH EVERY DAY 10/21/15   [provider]  aspirin 325 MG tablet Take 325 mg by mouth 2 (two) times daily. Patient takes if there is any pain or burning in the chest.    [provider]  atorvastatin (LIPITOR) 40 MG tablet Take 1 tablet (40 mg total) by mouth daily at 6 PM. 12/18/11   Annita Brod, MD  carvedilol (COREG) 3.125 MG tablet Take 3.125 mg by mouth 2 (two) times daily with a meal.    [provider]  cetirizine (WAL-ZYR) 10 MG tablet Take 10 mg by mouth.    [provider]  clobetasol cream (TEMOVATE) 0.05 % APPLY TO PSORIASIS BID PRN UNTIL SMOOTH 11/05/14   [provider]  clopidogrel (PLAVIX) 75 MG  tablet TAKE 1 TABLET(75 MG) BY MOUTH EVERY DAY 08/11/16   Fritzi Mandes, MD  cyanocobalamin 1000 MCG tablet Take 1,000 mcg by mouth daily.     [provider]  furosemide (LASIX) 20 MG tablet Take 1 tablet (20 mg total) by mouth daily. 08/12/16   Fritzi Mandes, MD  GLUCOSAMINE-CHONDROITIN-VIT C PO Take 1 tablet by mouth daily.    [provider]  hydrochlorothiazide (HYDRODIURIL) 12.5 MG tablet Take 12.5 mg by mouth daily.  04/22/15   [provider]  isosorbide mononitrate (IMDUR) 30 MG 24 hr tablet Take 30 mg by mouth daily. 06/18/16   [provider]  omeprazole (PRILOSEC) 20 MG capsule Take by mouth. 01/16/16   [provider]  potassium chloride SA (K-DUR,KLOR-CON) 20 MEQ tablet TAKE 2 TABLETS BY MOUTH TWICE DAILY 12/13/15   [provider]  tamsulosin (FLOMAX) 0.4 MG CAPS capsule TAKE 1 CAPSULE BY MOUTH EVERY DAY 10/28/15   [provider]    Physical Exam: Vitals:   09/07/16 1904 09/07/16 1905  BP: (!) 149/75   Pulse: 73   Resp: 20   Temp: 98.5 F (36.9 C)   TempSrc: Oral   SpO2: 96%   Weight:  101.2 kg (223 lb)  Height:  5\' 10"  (1.778 m)    GENERAL: 76 y.o.-year-old male patient, well-developed, well-nourished lying in the bed in no acute  distress.  Pleasant and cooperative.   HEENT: Head atraumatic, normocephalic. Pupils equal, round, reactive to light and accommodation. No scleral icterus. Extraocular muscles intact. Nares are patent. Oropharynx is clear. Mucus membranes moist. NECK: Supple, full range of motion. No JVD, no bruit heard. No thyroid enlargement, no tenderness, no cervical lymphadenopathy. CHEST: Normal breath sounds bilaterally. No wheezing, rales, rhonchi or crackles. No use of accessory muscles of respiration.  No reproducible chest wall tenderness.  CARDIOVASCULAR: S1, S2 normal. Distant heart sounds. Cap refill <2 seconds. Pulses intact distally.  ABDOMEN: Soft, nondistended, nontender. No rebound, guarding,  rigidity. Normoactive bowel sounds present in all four quadrants. No organomegaly or mass. EXTREMITIES: No pedal edema, cyanosis, or clubbing. No calf tenderness or Homan's sign.  NEUROLOGIC: The patient is alert and oriented x 3. Cranial nerves II through XII are grossly intact with no focal sensorimotor deficit. Muscle strength 5/5 in all extremities. Sensation intact. Gait not checked. PSYCHIATRIC:  Normal affect, mood, thought content. SKIN: Warm, dry, and intact without obvious rash, lesion, or ulcer.    Labs on Admission:  CBC:  Recent Labs Lab 09/07/16 1908  WBC 6.4  HGB 11.0*  HCT 33.3*  MCV 80.6  PLT 332   Basic Metabolic Panel:  Recent Labs Lab 09/07/16 1908  NA 134*  K 4.0  CL 100*  CO2 27  GLUCOSE 107*  BUN 19  CREATININE 1.12  CALCIUM 8.4*   GFR: Estimated Creatinine Clearance: 66.9 mL/min (by C-G formula based on SCr of 1.12 mg/dL). Liver Function Tests: No results for input(s): AST, ALT, ALKPHOS, BILITOT, PROT, ALBUMIN in the last 168 hours. No results for input(s): LIPASE, AMYLASE in the last 168 hours. No results for input(s): AMMONIA in the last 168 hours. Coagulation Profile: No results for input(s): INR, PROTIME in the last 168 hours. Cardiac Enzymes:  Recent Labs Lab 09/07/16 1908  TROPONINI 0.05*   BNP (last 3 results) No results for input(s): PROBNP in the last 8760 hours. HbA1C: No results for input(s): HGBA1C in the last 72 hours. CBG: No results for input(s): GLUCAP in the last 168 hours. Lipid Profile: No results for input(s): CHOL, HDL, LDLCALC, TRIG, CHOLHDL, LDLDIRECT in the last 72 hours. Thyroid Function Tests: No results for input(s): TSH, T4TOTAL, FREET4, T3FREE, THYROIDAB in the last 72 hours. Anemia Panel: No results for input(s): VITAMINB12, FOLATE, FERRITIN, TIBC, IRON, RETICCTPCT in the last 72 hours. Urine analysis:    Component Value Date/Time   COLORURINE YELLOW (A) 06/20/2015 1626   APPEARANCEUR Clear  01/23/2016 1006   LABSPEC 1.011 06/20/2015 1626   PHURINE 7.0 06/20/2015 1626   GLUCOSEU Negative 01/23/2016 1006   HGBUR NEGATIVE 06/20/2015 1626   BILIRUBINUR Negative 01/23/2016 1006   KETONESUR NEGATIVE 06/20/2015 1626   PROTEINUR 2+ (A) 01/23/2016 1006   PROTEINUR 100 (A) 06/20/2015 1626   UROBILINOGEN 0.2 04/06/2011 0900   NITRITE Negative 01/23/2016 1006   NITRITE NEGATIVE 06/20/2015 1626   LEUKOCYTESUR Negative 01/23/2016 1006   Sepsis Labs: @LABRCNTIP (procalcitonin:4,lacticidven:4) )No results found for this or any previous visit (from the past 240 hour(s)).   Radiological Exams on Admission: Dg Chest 2 View  Result Date: 09/07/2016 CLINICAL DATA:  Shortness of breath. EXAM: CHEST  2 VIEW COMPARISON:  08/07/2016. FINDINGS: The heart is enlarged. There is increasing interstitial pulmonary edema and increased small BILATERAL effusions when compared to the previous radiograph. Calcified tortuous thoracic aorta. Prominent central hilar markings are vascular in nature. Bones are unremarkable. Stable aortic stent. IMPRESSION: Cardiomegaly with alert pulmonary  edema. Worsening aeration since April. Electronically Signed   By: Staci Righter M.D.   On: 09/07/2016 19:30   Cardiac CATH Conclusion 08/10/16    Ost Ramus to Ramus lesion, 10 %stenosed.  Prox RCA to Mid RCA lesion, 30 %stenosed.  A STENT XIENCE ALPINE RX [id number] drug eluting stent was successfully placed, and overlaps previously placed stent.  Ost LAD to Prox LAD lesion, 90 %stenosed.  Post intervention, there is a 0% residual stenosis.   1. One-vessel CAD with high-grade in-stent restenosis proximal LAD 2. Moderate cardiomyopathy with global hypokinesis 3. Successful DES of in-stent restenosis proximal LAD   ECHO 08/08/16 Study Conclusions  - Left ventricle: The cavity size was mildly dilated. Wall   thickness was increased in a pattern of mild LVH. Systolic   function was moderately reduced. The estimated  ejection fraction   was in the range of 35% to 40%. - Aortic valve: There was mild regurgitation. - Mitral valve: There was moderate regurgitation. - Left atrium: The atrium was mildly dilated. - Right atrium: The atrium was mildly dilated. - Tricuspid valve: There was moderate regurgitation.   EKG: Normal sinus rhythm at 78 bpm with normal axis, LBBB (old) and nonspecific ST-T wave changes.   Assessment/Plan  This is a 76 y.o. male with a history of Hypertension, hyperlipidemia, asthma,anemia, BPH,  Coronary artery disease status post restenting of an occluded LAD last month during an admission for acute exacerbation of chronic systolic congestive heart failure now being admitted with:  #. Acute exacerbation of congestive heart failure - Admit inpatient, telemetry monitoring, pulse oximetry - IV Lasix - Continue Coreg, Lotrel, Imdur, Lipitor - Intake/output, daily weight. - Trend troponins, BNP, TSH. - Cardiology consultation requested.  #. Elevated troponin, rule out acute MI -Monitor on telemetry -Trend troponins  #. Hyponatremia, mild - Monitor BMP  #. Anemia, chronic and stable - Monitor CBC  #. History of CAD - Continue aspirin and Plavix  #. History of GERD - Continue Protonix for Prilosec  #. History of BPH - Continue Flomax  #. History of hypertension - Continue Lotrel, HCTZ  #. History of hyperlipidemia - Continue Lipitor  Admission status: Inpatient, tele IV Fluids: HL Diet/Nutrition: HH Consults called: Cardio - Dr. Clayborn Bigness  DVT Px: Lovenox, SCDs and early ambulation. Code Status: Full Code  Disposition Plan: To home in 1-2 days  All the records are reviewed and case discussed with ED provider. Management plans discussed with the patient and/or family who express understanding and agree with plan of care.  Drey Shaff D.O. on 09/07/2016 at 8:55 PM Between 7am to 6pm - Pager - [phone] After 6pm go to [email or web address] - [facility] [place] [facility] Office [phone] CC: Primary care physician; Tracie Harrier, MD   09/07/2016, 8:55 PM

## 2016-09-07 NOTE — ED Triage Notes (Signed)
Pt complains of shortness of breath, pt denies pain, pt denies increased in weight, pt reports being anxious, pt denies chest pain, pt in no distress

## 2016-09-07 NOTE — ED Provider Notes (Signed)
[facility] Emergency Department Provider Note  ____________________________________________   I have reviewed the triage vital signs and the nursing notes.   HISTORY  Chief Complaint Shortness of Breath    HPI Peter [name] is a 76 y.o. male  with a LV EF: 35% -   40% by echo on the 30th of last month, patient had a left heart catheter at that time as well which showed a high-grade stenosis of the LAD stent which was restented. She states that over the last couple days she's had increased shortness of breath. He last was admitted to the hospital but a month ago and diuresis many liters. He states his weight has been slightly up. Patient states he has had orthopnea and what sounds like paroxysmal nocturnal dyspnea over the last couple days. When he has not had his chest pain. History is of breath is worse when he walks around he did have shortness of breath when he went to the bathroom for example. His legs are more swollen. The last couple days. She states she is compliant with his medications has no other acute complaints. Nothing makes this better, aside from rest, walking makes it worse.    Past Medical History:  Diagnosis Date  . Anginal pain ([place])   . Asthma   . Bladder infection, acute 03/2011   "had a whole lot of bleeding from this"  . Coronary artery disease   . High cholesterol   . Hypertension   . 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 Myocardial infarction ([place]) 1996  . NSTEMI (non-ST elevated myocardial infarction) ([place]) 12/14/2011  . Shortness of breath 12/14/2011   "@ rest, lying down, w/exertion"    Patient Active Problem List   Diagnosis Date Noted  . Congestive heart failure ([place]) 01/17/2015  . Current tobacco use 01/17/2015  . Hypercholesterolemia 01/17/2015  . Peripheral vascular disease ([place]) 01/17/2015  . 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 Compulsive tobacco user  syndrome 08/23/2013  . Arthritis, degenerative 07/22/2013  . H/O angina pectoris 07/22/2013  . Adiposity 07/22/2013  . 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 Acute myocardial infarction, subendocardial infarction, initial episode of care ([place]) 12/17/2011  . Hypokalemia 12/15/2011  . SOB (shortness of breath) 12/14/2011  . CAD (coronary artery disease) 12/14/2011  . HTN (hypertension) 12/14/2011  . Hyperlipidemia 12/14/2011  . GERD (gastroesophageal reflux disease) 12/14/2011  . BPH (benign prostatic hyperplasia) 12/14/2011  . 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 [place]; ~ 2003; ~ 2008   "total of 3"  . CORONARY STENT INTERVENTION N/A 08/10/2016   Procedure: Coronary Stent Intervention;  Surgeon: [name], MD;  Location: [place] [facility];  Service: Cardiovascular;  Laterality: N/A;  . HERNIA REPAIR  ~ 2001   "abdominal w/mesh implanted"  . LEFT HEART CATH AND CORONARY ANGIOGRAPHY N/A 08/10/2016   Procedure: Left Heart Cath and Coronary Angiography;  Surgeon: [name], MD;  Location: [place] [facility];  Service: Cardiovascular;  Laterality: N/A;  . LEFT HEART CATHETERIZATION WITH CORONARY ANGIOGRAM N/A 12/16/2011   Procedure: LEFT HEART CATHETERIZATION WITH CORONARY ANGIOGRAM;  Surgeon: Minus Breeding, MD;  Location: [facility] CATH LAB;  Service: Cardiovascular;  Laterality: N/A;  . PERCUTANEOUS CORONARY STENT INTERVENTION (PCI-S) N/A 12/17/2011   Procedure: PERCUTANEOUS  CORONARY STENT INTERVENTION (PCI-S);  Surgeon: Sherren Mocha, MD;  Location: [facility] CATH LAB;  Service: Cardiovascular;  Laterality: N/A;  . TONSILLECTOMY     "I was a kid"    Prior to Admission medications   Medication Sig Start Date End Date Taking? Authorizing Provider  amLODipine-benazepril (LOTREL) 10-40 MG capsule TAKE 1  CAPSULE BY MOUTH EVERY DAY 10/21/15   [provider]  aspirin 325 MG tablet Take 325 mg by mouth 2 (two) times daily. Patient takes if there is any pain or burning in the chest.    [provider]  atorvastatin (LIPITOR) 40 MG tablet Take 1 tablet (40 mg total) by mouth daily at 6 PM. 12/18/11   Annita Brod, MD  carvedilol (COREG) 3.125 MG tablet Take 3.125 mg by mouth 2 (two) times daily with a meal.    [provider]  cetirizine (WAL-ZYR) 10 MG tablet Take 10 mg by mouth.    [provider]  clobetasol cream (TEMOVATE) 0.05 % APPLY TO PSORIASIS BID PRN UNTIL SMOOTH 11/05/14   [provider]  clopidogrel (PLAVIX) 75 MG tablet TAKE 1 TABLET(75 MG) BY MOUTH EVERY DAY 08/11/16   Fritzi Mandes, MD  cyanocobalamin 1000 MCG tablet Take 1,000 mcg by mouth daily.     [provider]  furosemide (LASIX) 20 MG tablet Take 1 tablet (20 mg total) by mouth daily. 08/12/16   Fritzi Mandes, MD  GLUCOSAMINE-CHONDROITIN-VIT C PO Take 1 tablet by mouth daily.    [provider]  hydrochlorothiazide (HYDRODIURIL) 12.5 MG tablet Take 12.5 mg by mouth daily.  04/22/15   [provider]  isosorbide mononitrate (IMDUR) 30 MG 24 hr tablet Take 30 mg by mouth daily. 06/18/16   [provider]  omeprazole (PRILOSEC) 20 MG capsule Take by mouth. 01/16/16   [provider]  potassium chloride SA (K-DUR,KLOR-CON) 20 MEQ tablet TAKE 2 TABLETS BY MOUTH TWICE DAILY 12/13/15   [provider]  tamsulosin (FLOMAX) 0.4 MG CAPS capsule TAKE 1 CAPSULE BY MOUTH EVERY DAY 10/28/15   [provider]    Allergies Iodinated diagnostic agents and Iodinated glycerol  [glycerol, iodinated]  Family History  Problem Relation Age of Onset  . Family history unknown: Yes    Social History Social History  Substance Use Topics  . Smoking status: Current Every Day Smoker    Packs/day: 1.00    Years: 53.00    Types: Cigarettes  . Smokeless  tobacco: Never Used  . Alcohol use 0.6 oz/week    1 Cans of beer per week     Comment: 12/14/2011 "if my kidneys are sore, I drink 1 beer/day; if not sore; no beer; occasionally drink socially; ave 1 beer/wk maybe"    Review of Systems Constitutional: No fever/chills Eyes: No visual changes. ENT: No sore throat. No stiff neck no neck pain Cardiovascular: Denies chest pain. Respiratory: Plus shortness of breath. Gastrointestinal:   no vomiting.  No diarrhea.  No constipation. Genitourinary: Negative for dysuria. Musculoskeletal: Negative lower extremity swelling Skin: Negative for rash. Neurological: Negative for severe headaches, focal weakness or numbness.   ____________________________________________   PHYSICAL EXAM:  VITAL SIGNS: ED Triage Vitals  Enc Vitals Group     BP 09/07/16 1904 (!) 149/75     Pulse Rate 09/07/16 1904 73     Resp 09/07/16 1904 20     Temp 09/07/16 1904 98.5 F (36.9 C)     Temp Source 09/07/16 1904 Oral  SpO2 09/07/16 1904 96 %     Weight 09/07/16 1905 223 lb (101.2 kg)     Height 09/07/16 1905 5\' 10"  (1.778 m)     Head Circumference --      Peak Flow --      Pain Score --      Pain Loc --      Pain Edu? --      Excl. in [place]? --     Constitutional: Alert and oriented. Well appearing and in no acute distress. Eyes: Conjunctivae are normal Head: Atraumatic HEENT: No congestion/rhinnorhea. Mucous membranes are moist.  Oropharynx non-erythematous Neck:   Nontender with no meningismus, no masses, no stridor Cardiovascular: Normal rate, regular rhythm. Grossly normal heart sounds.  Good peripheral circulation. Respiratory: Normal respiratory effort.  No retractions. Lungs [place] in the bases with occasional rales Abdominal: Soft and nontender. No distention. No guarding no rebound Back:  There is no focal tenderness or step off.  there is no midline tenderness there are no lesions noted. there is no CVA tenderness Musculoskeletal: No lower  extremity tenderness, no upper extremity tenderness. No joint effusions, no DVT signs strong distal pulses no edema Neurologic:  Normal speech and language. No gross focal neurologic deficits are appreciated.  Skin:  Skin is warm, dry and intact. No rash noted. Psychiatric: Mood and affect are normal. Speech and behavior are normal.  ____________________________________________   LABS (all labs ordered are listed, but only abnormal results are displayed)  Labs Reviewed  BASIC METABOLIC PANEL - Abnormal; Notable for the following:       Result Value   Sodium 134 (*)    Chloride 100 (*)    Calcium 8.4 (*)    All other components within normal limits  CBC - Abnormal; Notable for the following:    RBC 4.13 (*)    Hemoglobin 11.0 (*)    HCT 33.3 (*)    RDW 16.5 (*)    All other components within normal limits  TROPONIN I - Abnormal; Notable for the following:    Troponin I 0.05 (*)    All other components within normal limits  BRAIN NATRIURETIC PEPTIDE   ____________________________________________  EKG  I personally interpreted any EKGs ordered by me or triage Likely sinus rhythm with PACs, rate 78, no acute ST elevation or depression of her baseline, known left bundle-branch block noted. ____________________________________________  [id number]  I reviewed any imaging ordered by me or triage that were performed during my shift and, if possible, patient and/or family made aware of any abnormal findings. ____________________________________________   PROCEDURES  Procedure(s) performed: None  Procedures  Critical Care performed: None  ____________________________________________   INITIAL IMPRESSION / ASSESSMENT AND PLAN / ED COURSE  Pertinent labs & imaging results that were available during my care of the patient were reviewed by me and considered in my medical decision making (see chart for details).  Administration here with CHF symptoms. We will give him Lasix.  He will require admission for diuresis.    ____________________________________________   FINAL CLINICAL IMPRESSION(S) / ED DIAGNOSES  Final diagnoses:  Dyspnea on exertion  Acute on chronic congestive heart failure, unspecified heart failure type [facility])      This chart was dictated using voice recognition software.  Despite best efforts to proofread,  errors can occur which can change meaning.      Schuyler Amor, MD 09/07/16 2031

## 2016-09-07 NOTE — ED Notes (Signed)
Date and time results received: 09/07/16 1948 (use smartphrase ".now" to insert current time)  Test: troponin 0.05 Critical Value:   Name of Provider Notified:[place]  Orders Received? Or Actions Taken?: pt roomed

## 2016-09-08 LAB — BASIC METABOLIC PANEL
ANION GAP: 7 (ref 5–15)
BUN: 17 mg/dL (ref 6–20)
CALCIUM: 8.4 mg/dL — AB (ref 8.9–10.3)
CO2: 29 mmol/L (ref 22–32)
CREATININE: 1.28 mg/dL — AB (ref 0.61–1.24)
Chloride: 104 mmol/L (ref 101–111)
GFR calc Af Amer: >60 mL/min (ref >60)
GFR calc non Af Amer: 53 mL/min — ABNORMAL LOW (ref >60)
GLUCOSE: 109 mg/dL — AB (ref 65–99)
Potassium: 3.3 mmol/L — ABNORMAL LOW (ref 3.5–5.1)
Sodium: 140 mmol/L (ref 135–145)

## 2016-09-08 LAB — TROPONIN I
TROPONIN I: 0.05 ng/mL — AB (ref <0.03)
Troponin I: 0.05 ng/mL (ref <0.03)
Troponin I: 0.06 ng/mL (ref <0.03)

## 2016-09-08 LAB — BRAIN NATRIURETIC PEPTIDE: B NATRIURETIC PEPTIDE 5: 1111 pg/mL — AB (ref 0.0–100.0)

## 2016-09-08 LAB — TSH: TSH: 0.629 u[IU]/mL (ref 0.350–4.500)

## 2016-09-08 MED ORDER — FUROSEMIDE 10 MG/ML IJ SOLN
20.0000 mg | Freq: Once | INTRAMUSCULAR | Status: AC
  Administered 2016-09-08: 20 mg via INTRAVENOUS
  Filled 2016-09-08: qty 2

## 2016-09-08 MED ORDER — SPIRONOLACTONE 25 MG PO TABS
25.0000 mg | ORAL_TABLET | Freq: Every day | ORAL | Status: DC
  Administered 2016-09-08 – 2016-09-10 (×3): 25 mg via ORAL
  Filled 2016-09-08 (×3): qty 1

## 2016-09-08 MED ORDER — IPRATROPIUM-ALBUTEROL 0.5-2.5 (3) MG/3ML IN SOLN
3.0000 mL | Freq: Once | RESPIRATORY_TRACT | Status: AC
  Administered 2016-09-08: 3 mL via RESPIRATORY_TRACT
  Filled 2016-09-08: qty 3

## 2016-09-08 MED ORDER — IPRATROPIUM-ALBUTEROL 0.5-2.5 (3) MG/3ML IN SOLN
RESPIRATORY_TRACT | Status: AC
  Filled 2016-09-08: qty 3

## 2016-09-08 NOTE — Progress Notes (Signed)
Patient complaining of shortness of breath, oxygen saturations 94%, fine crackles can be heard throughout. Patient requesting breathing treatment. MD Pyreddy notified. Verbal order given for one duo neb treatment. MD also made aware of BNP increasing to 1111.0. Per MD Pyreddy, give additional IV lasix 20 mg ONCE. This RN to place orders and administer as ordered.   Peter [name] M

## 2016-09-08 NOTE — Care Management (Signed)
Patient is currently followed by the Heart Failure Clinic.  Did not call the clinic prior to proceeding to the ED as the clinic was closed for the ho;iday.  Verbalizes compliance with daily weights, making  every effort to follow low sodium diet and compliant with med administration.  [facility] referral made

## 2016-09-08 NOTE — Progress Notes (Signed)
[place] [facility] [place] NAME: Peter [name]    MR#:  [id number]  DATE OF BIRTH:  1940/11/10  SUBJECTIVE: Patient is seen at bedside, admitted this morning for heart failure. On IV Lasix. Says shortness of breath slightly better.   CHIEF COMPLAINT:   Chief Complaint  Patient presents with  . Shortness of Breath    REVIEW OF SYSTEMS:   ROS CONSTITUTIONAL: No fever, fatigue or weakness.  EYES: No blurred or double vision.  EARS, NOSE, AND THROAT: No tinnitus or ear pain.  RESPIRATORY: shortness of Breath, orthopnea CARDIOVASCULAR: No chest pain, orthopnea, edema.  GASTROINTESTINAL: No nausea, vomiting, diarrhea or abdominal pain.  GENITOURINARY: No dysuria, hematuria.  ENDOCRINE: No polyuria, nocturia,  HEMATOLOGY: No anemia, easy bruising or bleeding SKIN: No rash or lesion. MUSCULOSKELETAL: No joint pain or arthritis.   NEUROLOGIC: No tingling, numbness, weakness.  PSYCHIATRY: No anxiety or depression.   DRUG ALLERGIES:   Allergies  Allergen Reactions  . Iodinated Diagnostic Agents Shortness Of Breath  . Iodinated Glycerol  [Glycerol, Iodinated] Shortness Of Breath    VITALS:  Blood pressure 135/70, pulse 81, temperature 98.1 F (36.7 C), temperature source Oral, resp. rate (!) 21, height 5\' 11"  (1.803 m), weight 98.6 kg (217 lb 4.8 oz), SpO2 97 %.  PHYSICAL EXAMINATION:  GENERAL:  76 y.o.-year-old patient lying in the bed with no acute distress.  EYES: Pupils equal, round, reactive to light and accommodation. No scleral icterus. Extraocular muscles intact.  HEENT: Head atraumatic, normocephalic. Oropharynx and nasopharynx clear.  NECK:  Supple, no jugular venous distention. No thyroid enlargement, no tenderness.  LUNGS: Normal breath sounds bilaterally, no wheezing, rales,rhonchi or crepitation. No use of accessory muscles of respiration.  CARDIOVASCULAR: S1, S2 normal. No murmurs, rubs, or gallops.  ABDOMEN: Soft,  nontender, nondistended. Bowel sounds present. No organomegaly or mass.  EXTREMITIES: No pedal edema, cyanosis, or clubbing.  NEUROLOGIC: Cranial nerves II through XII are intact. Muscle strength 5/5 in all extremities. Sensation intact. Gait not checked.  PSYCHIATRIC: The patient is alert and oriented x 3.  SKIN: No obvious rash, lesion, or ulcer.    LABORATORY PANEL:   CBC  Recent Labs Lab 09/07/16 1908  WBC 6.4  HGB 11.0*  HCT 33.3*  PLT 166   ------------------------------------------------------------------------------------------------------------------  Chemistries   Recent Labs Lab 09/08/16 0651  NA 140  K 3.3*  CL 104  CO2 29  GLUCOSE 109*  BUN 17  CREATININE 1.28*  CALCIUM 8.4*   ------------------------------------------------------------------------------------------------------------------  Cardiac Enzymes  Recent Labs Lab 09/08/16 0651  TROPONINI 0.05*   ------------------------------------------------------------------------------------------------------------------  RADIOLOGY:  Dg Chest 2 View  Result Date: 09/07/2016 CLINICAL DATA:  Shortness of breath. EXAM: CHEST  2 VIEW COMPARISON:  08/07/2016. FINDINGS: The heart is enlarged. There is increasing interstitial pulmonary edema and increased small BILATERAL effusions when compared to the previous radiograph. Calcified tortuous thoracic aorta. Prominent central hilar markings are vascular in nature. Bones are unremarkable. Stable aortic stent. IMPRESSION: Cardiomegaly with alert pulmonary edema. Worsening aeration since April. Electronically Signed   By: Staci Righter M.D.   On: 09/07/2016 19:30    EKG:   Orders placed or performed during the hospital encounter of 09/07/16  . ED EKG  . ED EKG  . ED EKG  . ED EKG    ASSESSMENT AND PLAN:   1. Acute on chronic systolic heart failure: NYHA class 3; 3. On IV Lasix, continue Coreg, Imdur, Lipitor, patient may need to be  on entresto. Started on  Coreg recently.  Cardiology consult pending. #2 hyponatremia: Mild likely due to CHF: Sodium is better today at 140. #3 hypokalemia due to diuretics: Replace the potassium. #3 slightly elevated troponins due to this demand ischemia: Troponin plateaued at  0.05.  D/w RN,. All the records are reviewed and case discussed with Care Management/Social Workerr. Management plans discussed with the patient, family and they are in agreement.  CODE STATUS: full  TOTAL TIME TAKING CARE OF THIS PATIENT: 35 minutes.   POSSIBLE D/C IN*1-2 DAYS, DEPENDING ON CLINICAL CONDITION.   Epifanio Lesches M.D on 09/08/2016 at 12:45 PM  Between 7am to 6pm - Pager - [phone]  After 6pm go to [email or web address] - password [email or web address] [facility]  Office  [phone]  CC: Primary care physician; Tracie Harrier, MD   Note: This dictation was prepared with Dragon dictation along with smaller phrase technology. Any transcriptional errors that result from this process are unintentional.

## 2016-09-08 NOTE — Plan of Care (Signed)
Problem: Safety: Goal: Ability to remain free from injury will improve Outcome: Progressing Fall precautions in place, non skid socks when oob  Problem: Pain Managment: Goal: General experience of comfort will improve Outcome: Progressing Prn medications  Problem: Fluid Volume: Goal: Ability to maintain a balanced intake and output will improve Outcome: Progressing Intake and out put  Problem: Cardiac: Goal: Ability to achieve and maintain adequate cardiopulmonary perfusion will improve Outcome: Progressing IV Lasix, daily weights, intake and output

## 2016-09-08 NOTE — Consult Note (Signed)
[place]  CARDIOLOGY CONSULT NOTE  Patient ID: GEE HABIG MRN: [id number] DOB/AGE: [birth year] 77 y.o.  Admit date: 09/07/2016 Referring Physician Dr. Vianne Bulls Primary Physician   Primary Cardiologist Dr. Clayborn Bigness Reason for Consultation CHF  HPI: Pt is a 76 yo male with history of cad with a recent cardiac cath which revealed a 90% proximal lad stent stenosis which was treated with a xience [place]pine 3.25 x 23 mm des. There was insignificant disease in the remaineder of the anatomy. EF was mildly reduced with an ef of 35-40% with mild ai and mod mr. He is now readmitted with complaints of sob. CXR reveals mild pulmonary edema. He has ruled out for an mi. He reports compliance with his meds but eats a fairly high sodium diet. He was not placed on any diuretics at previous discharge.   Review of Systems  Constitutional: Positive for malaise/fatigue.  HENT: Negative.   Eyes: Negative.   Respiratory: Positive for shortness of breath.   Cardiovascular: Negative.   Gastrointestinal: Negative.   Genitourinary: Negative.   Musculoskeletal: Negative.   Skin: Negative.   Neurological: Negative.   Endo/Heme/Allergies: Negative.   Psychiatric/Behavioral: Negative.     Past Medical History:  Diagnosis Date  . Anginal pain ([place])   . Asthma   . Bladder infection, acute 03/2011   "had a whole lot of bleeding from this"  . Coronary artery disease   . High cholesterol   . Hypertension   . 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 Myocardial infarction ([address]) 1996  . NSTEMI (non-ST elevated myocardial infarction) ([place]) 12/14/2011  . Shortness of breath 12/14/2011   "@ rest, lying down, w/exertion"    Family History  Problem Relation Age of Onset  . Family history unknown: Yes    Social History   Social History  . Marital status: Widowed    Spouse name: N/A  . Number of children: N/A  .  Years of education: N/A   Occupational History  . Not on file.   Social History Main Topics  . Smoking status: Current Every Day Smoker    Packs/day: 1.00    Years: 53.00    Types: Cigarettes  . Smokeless tobacco: Never Used  . Alcohol use 0.6 oz/week    1 Cans of beer per week     Comment: 12/14/2011 "if my kidneys are sore, I drink 1 beer/day; if not sore; no beer; occasionally drink socially; ave 1 beer/wk maybe"  . Drug use: No  . Sexual activity: Not Currently   Other Topics Concern  . Not on file   Social History Narrative  . No narrative on file    Past Surgical History:  Procedure Laterality Date  . [place]; ~ 2003; ~ 2008   "total of 3"  . CORONARY STENT INTERVENTION N/A 08/10/2016   Procedure: Coronary Stent Intervention;  Surgeon: [name], MD;  Location: [place] CV LAB;  Service: Cardiovascular;  Laterality: N/A;  . HERNIA REPAIR  ~ 2001   "abdominal w/mesh implanted"  . LEFT HEART CATH AND CORONARY ANGIOGRAPHY N/A 08/10/2016   Procedure: Left Heart Cath and Coronary Angiography;  Surgeon: [name], MD;  Location: [place] CV LAB;  Service: Cardiovascular;  Laterality: N/A;  . LEFT HEART CATHETERIZATION WITH CORONARY ANGIOGRAM N/A 12/16/2011   Procedure: LEFT HEART CATHETERIZATION WITH CORONARY ANGIOGRAM;  Surgeon:  Minus Breeding, MD;  Location: [facility] CATH LAB;  Service: Cardiovascular;  Laterality: N/A;  . PERCUTANEOUS CORONARY STENT INTERVENTION (PCI-S) N/A 12/17/2011   Procedure: PERCUTANEOUS CORONARY STENT INTERVENTION (PCI-S);  Surgeon: Sherren Mocha, MD;  Location: [facility] CATH LAB;  Service: Cardiovascular;  Laterality: N/A;  . TONSILLECTOMY     "I was a kid"     Prescriptions Prior to Admission  Medication Sig Dispense Refill Last Dose  . amLODipine-benazepril (LOTREL) 10-40 MG capsule TAKE 1 CAPSULE BY MOUTH EVERY DAY   09/06/2016 at 0800  . aspirin 325 MG tablet Take 325 mg by mouth 2 (two)  times daily. Patient takes if there is any pain or burning in the chest.   09/06/2016 at 2000  . atorvastatin (LIPITOR) 40 MG tablet Take 1 tablet (40 mg total) by mouth daily at 6 PM. 30 tablet 0 09/06/2016 at 0800  . carvedilol (COREG) 3.125 MG tablet Take 3.125 mg by mouth 2 (two) times daily with a meal.   09/06/2016 at 1800  . cetirizine (WAL-ZYR) 10 MG tablet Take 10 mg by mouth.   09/06/2016 at 0800  . clobetasol cream (TEMOVATE) 0.05 % APPLY TO PSORIASIS BID PRN UNTIL SMOOTH  1 prn at prn  . clopidogrel (PLAVIX) 75 MG tablet TAKE 1 TABLET(75 MG) BY MOUTH EVERY DAY 30 tablet 1 09/06/2016 at 0800  . cyanocobalamin 1000 MCG tablet Take 1,000 mcg by mouth daily.    09/06/2016 at 0800  . furosemide (LASIX) 20 MG tablet Take 1 tablet (20 mg total) by mouth daily. (Patient not taking: Reported on 09/08/2016) 30 tablet 0 Not Taking at Unknown time  . GLUCOSAMINE-CHONDROITIN-VIT C PO Take 1 tablet by mouth daily.   09/06/2016 at 0800  . hydrochlorothiazide (HYDRODIURIL) 12.5 MG tablet Take 12.5 mg by mouth daily.    08/30/2016 at 0800  . isosorbide mononitrate (IMDUR) 30 MG 24 hr tablet Take 30 mg by mouth daily.   09/06/2016 at 0800  . omeprazole (PRILOSEC) 20 MG capsule Take by mouth.   09/06/2016 at 0800  . potassium chloride SA (K-DUR,KLOR-CON) 20 MEQ tablet TAKE 2 TABLETS BY MOUTH TWICE DAILY   09/06/2016 at 1800  . tamsulosin (FLOMAX) 0.4 MG CAPS capsule TAKE 1 CAPSULE BY MOUTH EVERY DAY   09/06/2016 at 0800    Physical Exam: Blood pressure (!) 153/61, pulse 70, temperature 98.1 F (36.7 C), temperature source Oral, resp. rate 18, height 5\' 11"  (1.803 m), weight 98.6 kg (217 lb 4.8 oz), SpO2 94 %.   Wt Readings from Last 1 Encounters:  09/08/16 98.6 kg (217 lb 4.8 oz)     General appearance: alert and cooperative Resp: rales bibasilar Cardio: regular rate and rhythm GI: soft, non-tender; bowel sounds normal; no masses,  no organomegaly Extremities: edema 1+ lowewr extremety edema Neurologic:  Grossly normal  Labs:   Lab Results  Component Value Date   WBC 6.4 09/07/2016   HGB 11.0 (L) 09/07/2016   HCT 33.3 (L) 09/07/2016   MCV 80.6 09/07/2016   PLT 166 09/07/2016    Recent Labs Lab 09/08/16 0651  NA 140  K 3.3*  CL 104  CO2 29  BUN 17  CREATININE 1.28*  CALCIUM 8.4*  GLUCOSE 109*   Lab Results  Component Value Date   CKTOTAL 23 (L) 12/11/2013   CKMB 1.0 12/11/2013   TROPONINI 0.06 ([place]) 09/08/2016      Radiology: mild pulmonary edema. EKG: nsr with no ischemia  ASSESSMENT AND PLAN:  76 yo male with history  of cad s/p pci with recent restenting of the lad. His ef was reduced somewhat to 35-40%. He has ruled out for an mi by cardiac markerrs. Has some acute on chronic renal insuffiency. He admits to high sodium diet. Have discussed the improtance of reducing his sodium intake. We have discussed daily weights and admusting lasix based on this. Will continue with carvedilol, after load reduction and nitrtes as well as duap. Will add spironolactone.  Signed: Teodoro Spray MD, [facility] 09/08/2016, 8:50 PM

## 2016-09-09 LAB — BASIC METABOLIC PANEL
Anion gap: 8 (ref 5–15)
BUN: 22 mg/dL — ABNORMAL HIGH (ref 6–20)
CALCIUM: 8.5 mg/dL — AB (ref 8.9–10.3)
CO2: 31 mmol/L (ref 22–32)
CREATININE: 1.51 mg/dL — AB (ref 0.61–1.24)
Chloride: 102 mmol/L (ref 101–111)
GFR calc non Af Amer: 43 mL/min — ABNORMAL LOW (ref >60)
GFR, EST AFRICAN AMERICAN: 50 mL/min — AB (ref >60)
Glucose, Bld: 128 mg/dL — ABNORMAL HIGH (ref 65–99)
Potassium: 3.3 mmol/L — ABNORMAL LOW (ref 3.5–5.1)
Sodium: 141 mmol/L (ref 135–145)

## 2016-09-09 MED ORDER — SALINE SPRAY 0.65 % NA SOLN
1.0000 | NASAL | Status: DC | PRN
  Filled 2016-09-09: qty 44

## 2016-09-09 NOTE — Consult Note (Signed)
   [facility] CM Inpatient Consult   09/09/2016  Peter [name] 1940/07/04 [id number]  Referral received from inpatient case manager for [place] Management services and post hospital discharge follow up related to a diagnosis of HF. Patient was evaluated for community based chronic disease management services with [facility] as a benefit of patient's [employer] Medicare. Met with the patient at the bedside to explain [place] Management services. Patient had company but gave permission to speak about services in front of guests. Verbal consent recieved. Patient gave [phone]  as the best number to reach him. Patient will receive post hospital discharge calls and be evaluated for monthly home visits. [facility] Management services does not interfere with or replace any services arranged by the inpatient care management team. RNCM left contact information and [facility] literature at the bedside. Made inpatient RNCM aware that [facility] will be following for care management. For additional questions please contact:   Kaleena Corrow RN, [facility] Liaison  [phone]) Business Mobile [phone]) Toll free office

## 2016-09-09 NOTE — Progress Notes (Signed)
[place] [facility] [place] NAME: Peter [name]    MR#:  [id number]  DATE OF BIRTH:  03/18/1941  SUBJECTIVE: Admitted for CHF exacerbation, on IV Lasix. He says that he is feeling better today..   CHIEF COMPLAINT:   Chief Complaint  Patient presents with  . Shortness of Breath    REVIEW OF SYSTEMS:   ROS CONSTITUTIONAL: No fever, fatigue or weakness.  EYES: No blurred or double vision.  EARS, NOSE, AND THROAT: No tinnitus or ear pain.  RESPIRATORY: shortness of Breath, orthopnea Better today. CARDIOVASCULAR: No chest pain, orthopnea, edema.  GASTROINTESTINAL: No nausea, vomiting, diarrhea or abdominal pain.  GENITOURINARY: No dysuria, hematuria.  ENDOCRINE: No polyuria, nocturia,  HEMATOLOGY: No anemia, easy bruising or bleeding SKIN: No rash or lesion. MUSCULOSKELETAL: No joint pain or arthritis.   NEUROLOGIC: No tingling, numbness, weakness.  PSYCHIATRY: No anxiety or depression.   DRUG ALLERGIES:   Allergies  Allergen Reactions  . Iodinated Diagnostic Agents Shortness Of Breath  . Iodinated Glycerol  [Glycerol, Iodinated] Shortness Of Breath    VITALS:  Blood pressure (!) 143/69, pulse 70, temperature 98.6 F (37 C), temperature source Oral, resp. rate 18, height 5\' 11"  (1.803 m), weight 98.3 kg (216 lb 12.8 oz), SpO2 97 %.  PHYSICAL EXAMINATION:  GENERAL:  76 y.o.-year-old patient lying in the bed with no acute distress.  EYES: Pupils equal, round, reactive to light and accommodation. No scleral icterus. Extraocular muscles intact.  HEENT: Head atraumatic, normocephalic. Oropharynx and nasopharynx clear.  NECK:  Supple, no jugular venous distention. No thyroid enlargement, no tenderness.  LUNGS: Normal breath sounds bilaterally, no wheezing, rales,rhonchi or crepitation. No use of accessory muscles of respiration.  CARDIOVASCULAR: S1, S2 normal. No murmurs, rubs, or gallops.  ABDOMEN: Soft, nontender, nondistended.  Bowel sounds present. No organomegaly or mass.  EXTREMITIES: No pedal edema, cyanosis, or clubbing.  NEUROLOGIC: Cranial nerves II through XII are intact. Muscle strength 5/5 in all extremities. Sensation intact. Gait not checked.  PSYCHIATRIC: The patient is alert and oriented x 3.  SKIN: No obvious rash, lesion, or ulcer.    LABORATORY PANEL:   CBC  Recent Labs Lab 09/07/16 1908  WBC 6.4  HGB 11.0*  HCT 33.3*  PLT 166   ------------------------------------------------------------------------------------------------------------------  Chemistries   Recent Labs Lab 09/09/16 0446  NA 141  K 3.3*  CL 102  CO2 31  GLUCOSE 128*  BUN 22*  CREATININE 1.51*  CALCIUM 8.5*   ------------------------------------------------------------------------------------------------------------------  Cardiac Enzymes  Recent Labs Lab 09/08/16 1254  TROPONINI 0.06*   ------------------------------------------------------------------------------------------------------------------  RADIOLOGY:  Dg Chest 2 View  Result Date: 09/07/2016 CLINICAL DATA:  Shortness of breath. EXAM: CHEST  2 VIEW COMPARISON:  08/07/2016. FINDINGS: The heart is enlarged. There is increasing interstitial pulmonary edema and increased small BILATERAL effusions when compared to the previous radiograph. Calcified tortuous thoracic aorta. Prominent central hilar markings are vascular in nature. Bones are unremarkable. Stable aortic stent. IMPRESSION: Cardiomegaly with alert pulmonary edema. Worsening aeration since April. Electronically Signed   By: Staci Righter M.D.   On: 09/07/2016 19:30    EKG:   Orders placed or performed during the hospital encounter of 09/07/16  . ED EKG  . ED EKG  . ED EKG  . ED EKG    ASSESSMENT AND PLAN:   1. Acute on chronic systolic heart failure: NYHA class 3 . On IV Lasix, continue Coreg, Imdur, Lipitor, He is clinically improving continue IV Lasix for  1 more day and then  discharge home with by mouth Lasix tomorrow. CHF education, check daily weights,  Low salt intake,daily weights,patient weight is down from 100 to  98.3 pounds.  #2 hyponatremia: Mild likely due to CHF: Sodium is better today at 140. #3 hypokalemia due to diuretics: Replace the potassium. #3 slightly elevated troponins due to this demand ischemia: Troponin plateaued at  0.05.  D/w RN,. All the records are reviewed and case discussed with Care Management/Social Workerr. Management plans discussed with the patient, family and they are in agreement.  CODE STATUS: full  TOTAL TIME TAKING CARE OF THIS PATIENT: 35 minutes.   POSSIBLE D/C IN*1-2 DAYS, DEPENDING ON CLINICAL CONDITION.   Epifanio Lesches M.D on 09/09/2016 at 1:33 PM  Between 7am to 6pm - Pager - [phone]  After 6pm go to [email or web address] - password [email or web address] [facility]  Office  [phone]  CC: Primary care physician; Tracie Harrier, MD   Note: This dictation was prepared with Dragon dictation along with smaller phrase technology. Any transcriptional errors that result from this process are unintentional.

## 2016-09-09 NOTE — Care Management (Signed)
Referral accepted by [facility].  Estill Bamberg from [facility] on the unit and spoke with patient.  He says he does not remember going to the clinic .  If patient is homebound, may benefit from home health nurse follow up. Patient will have a copay on home health visits

## 2016-09-10 LAB — BASIC METABOLIC PANEL
Anion gap: 7 (ref 5–15)
BUN: 21 mg/dL — ABNORMAL HIGH (ref 6–20)
CHLORIDE: 103 mmol/L (ref 101–111)
CO2: 30 mmol/L (ref 22–32)
Calcium: 8.7 mg/dL — ABNORMAL LOW (ref 8.9–10.3)
Creatinine, Ser: 1.52 mg/dL — ABNORMAL HIGH (ref 0.61–1.24)
GFR calc Af Amer: 50 mL/min — ABNORMAL LOW (ref >60)
GFR, EST NON AFRICAN AMERICAN: 43 mL/min — AB (ref >60)
GLUCOSE: 109 mg/dL — AB (ref 65–99)
POTASSIUM: 3.5 mmol/L (ref 3.5–5.1)
Sodium: 140 mmol/L (ref 135–145)

## 2016-09-10 MED ORDER — FUROSEMIDE 40 MG PO TABS: 40.0000 mg | ORAL_TABLET | Freq: Every day | ORAL | 1 refills | Status: DC

## 2016-09-10 MED ORDER — FUROSEMIDE 20 MG PO TABS: 20.0000 mg | ORAL_TABLET | Freq: Every day | ORAL | 1 refills | Status: DC

## 2016-09-10 NOTE — Care Management Important Message (Signed)
Important Message  Patient Details  Name: Peter [name] MRN: [id number] Date of Birth: 20-Jan-1941   Medicare Important Message Given:  Yes Signed IM notice given   Katrina Stack, RN 09/10/2016, 2:30 PM

## 2016-09-10 NOTE — Care Management (Addendum)
Spoke with patient.  It is reported patient "was not taking his lasix."  Patient says he did not get a prescription for lasix at last discharge.  Patient has an after visit summary dated 08/19/2016 that does not have lasix in the current med list.  The summary is from a [facility] appointment at [facility].  Contacted [facility] and informed patient did pick up the script for lasix 08/11/2016. Patient then says he does not remember a bottle with that name on it.  "If I picked it up I am taking it. He declines need for any home health follow up. Again confirms that he has access  to scales and knowledgeable to weigh daily.  Unable to verbalize intervention for weight .  He again declines need for home health nurse.  Discussed at length it may provide assist with medication administration and patient continues to decline. Patient says he will look "at that paper they gave me when I left last time to see if the lasix was on it.  Patient will be assessed for need of home 02.  At present, do not anticipate need for home 02.

## 2016-09-10 NOTE — Progress Notes (Signed)
Pt discharged to home via wc.  Instructions  given to pt.  Questions answered.  No distress.  

## 2016-09-10 NOTE — Plan of Care (Signed)
Problem: Safety: Goal: Ability to remain free from injury will improve Outcome: Progressing Fall precautions in place, non skid socks in place when oob  Problem: Pain Managment: Goal: General experience of comfort will improve Outcome: Progressing No complaints of pain this shift, prn medications  Problem: Cardiac: Goal: Ability to achieve and maintain adequate cardiopulmonary perfusion will improve Outcome: Progressing Daily weights, intake and output

## 2016-09-10 NOTE — Discharge Summary (Signed)
Peter [name], is a 76 y.o. male  DOB [birth year]  MRN [id number].  Admission date:  09/07/2016  Admitting Physician  Harvie Bridge, DO  Discharge Date:  09/10/2016   Primary MD  Tracie Harrier, MD  Recommendations for primary care physician for things to follow:  Follow with his primary doctor in 1 week   follow-up with primary cardiologist in 1 week.   Admission Diagnosis  Dyspnea on exertion [R06.09] Acute on chronic congestive heart failure, unspecified heart failure type ([place]) [I50.9]   Discharge Diagnosis  Dyspnea on exertion [R06.09] Acute on chronic congestive heart failure, unspecified heart failure type ([place]) [I50.9]    Active Problems:   Acute systolic (congestive) heart failure (HCC)      Past Medical History:  Diagnosis Date  . Anginal pain ([place])   . Asthma   . Bladder infection, acute 03/2011   "had a whole lot of bleeding from this"  . Coronary artery disease   . High cholesterol   . Hypertension   . 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 Myocardial infarction ([place]) 1996  . NSTEMI (non-ST elevated myocardial infarction) ([place]) 12/14/2011  . Shortness of breath 12/14/2011   "@ rest, lying down, w/exertion"    Past Surgical History:  Procedure Laterality Date  . [place]; ~ 2003; ~ 2008   "total of 3"  . CORONARY STENT INTERVENTION N/A 08/10/2016   Procedure: Coronary Stent Intervention;  Surgeon: [name], MD;  Location: [place] CV LAB;  Service: Cardiovascular;  Laterality: N/A;  . HERNIA REPAIR  ~ 2001   "abdominal w/mesh implanted"  . LEFT HEART CATH AND CORONARY ANGIOGRAPHY N/A 08/10/2016   Procedure: Left Heart Cath and Coronary Angiography;  Surgeon: [name], MD;   Location: [place] CV LAB;  Service: Cardiovascular;  Laterality: N/A;  . LEFT HEART CATHETERIZATION WITH CORONARY ANGIOGRAM N/A 12/16/2011   Procedure: LEFT HEART CATHETERIZATION WITH CORONARY ANGIOGRAM;  Surgeon: Minus Breeding, MD;  Location: [facility] CATH LAB;  Service: Cardiovascular;  Laterality: N/A;  . PERCUTANEOUS CORONARY STENT INTERVENTION (PCI-S) N/A 12/17/2011   Procedure: PERCUTANEOUS CORONARY STENT INTERVENTION (PCI-S);  Surgeon: Sherren Mocha, MD;  Location: [facility] CATH LAB;  Service: Cardiovascular;  Laterality: N/A;  . TONSILLECTOMY     "I was a kid"       History of present illness and  Hospital Course:     Kindly see H&P for history of present illness and admission details, please review complete Labs, Consult reports and Test reports for all details in brief  HPI  from the history and physical done on the day of admission 76 year old male patient with history of chronic systolic heart failure with EF 35-40% admitted because of shortness of breath, chest x-ray showed pulmonary edema, admitted for acute on chronic systolic heart failure, and he is admitted to telemetry.   Hospital Course  #1 acute on chronic systolic heart failure: Dietary indiscretion, patient admits that he eats fairly high sodium diet. Her on IV Lasix 40 mg daily, patient rate decreased.pt weight decreased from 101 to 97 kgs. And he feels better, no shortness of breath, no low excellent edema. Discharge home with Lasix 20 mg by mouth daily. Patient did receive IV Lasix 40 mg daily. Does have slight bump in creatinine from 1.28-1.5 so I decreased the dose to 20 MG daily I suggested the patient to follow-up with primary doctor and also  primary cardiologist have the blood work drawn and see if he needs higher dose of Lasix at that time. He can go home on 20 mg of Lasix and I told him multiple times that he needs to check daily weights and have low-salt diet. And CHF education also is provided. If EKG is somewhat than  2 pounds in 1 day or 5 pounds in 1 week call his primary cardiologist to see if he can go up on higher dose of Lasix./Patient can continue his other medications for heart failure. Advised to  follow-up with CHF clinic regularly.  #2 hyponatremia: Due to CHF: Improved. 3. hypokalemia secondary to diuretics: Replace the potassium, patient can continue to take potassium supplements for her Lasix. #4.. slightlyelevated troponins to demand ischemia: Troponin plateaued at 0.05. No further chest pain. 5 BPH; has continue Flomax #6 hyperlipidemia: Continue statins.   Discharge Condition: stabnle   Follow UP    Discharge Instructions  and  Discharge Medications     Discharge Instructions    AMB Referral to [facility] Management    Complete by:  As directed    Reason for consult:  post hospital discharge follow up with community care coordinator   Diagnoses of:  Heart Failure   Expected date of contact:  1-3 days (reserved for hospital discharges)   Please assign patient for community nurse to engage for transition of care calls and evaluate for monthly home visits. For questions please contact:   Janci Minor RN, [facility] Liaison [phone])     Allergies as of 09/10/2016      Reactions   Iodinated Diagnostic Agents Shortness Of Breath   Iodinated Glycerol  [glycerol, Iodinated] Shortness Of Breath      Medication List    TAKE these medications   amLODipine-benazepril 10-40 MG capsule Commonly known as:  LOTREL TAKE 1 CAPSULE BY MOUTH EVERY DAY   aspirin 325 MG tablet Take 325 mg by mouth 2 (two) times daily. Patient takes if there is any pain or burning in the chest.   atorvastatin 40 MG tablet Commonly known as:  LIPITOR Take 1 tablet (40 mg total) by mouth daily at 6 PM.   carvedilol 3.125 MG tablet Commonly known as:  COREG Take 3.125 mg by mouth 2 (two) times daily with a meal.   clobetasol cream 0.05 % Commonly known as:  TEMOVATE APPLY TO PSORIASIS BID  PRN UNTIL SMOOTH   clopidogrel 75 MG tablet Commonly known as:  PLAVIX TAKE 1 TABLET(75 MG) BY MOUTH EVERY DAY   cyanocobalamin 1000 MCG tablet Take 1,000 mcg by mouth daily.   furosemide 20 MG tablet Commonly known as:  LASIX Take 1 tablet (20 mg total) by mouth daily.   GLUCOSAMINE-CHONDROITIN-VIT C PO Take 1 tablet by mouth daily.   hydrochlorothiazide 12.5 MG tablet Commonly known as:  HYDRODIURIL Take 12.5 mg by mouth daily.   isosorbide mononitrate 30 MG 24 hr tablet Commonly known as:  IMDUR Take 30 mg by mouth daily.   omeprazole 20 MG capsule Commonly known as:  PRILOSEC Take by mouth.   potassium chloride SA 20 MEQ tablet Commonly known as:  K-DUR,KLOR-CON TAKE 2 TABLETS BY MOUTH TWICE DAILY   tamsulosin 0.4 MG Caps capsule Commonly known as:  FLOMAX TAKE 1 CAPSULE BY MOUTH EVERY DAY   WAL-ZYR 10 MG tablet Generic drug:  cetirizine Take 10 mg by mouth.         Diet and Activity recommendation: See Discharge Instructions above  Consults obtained -cardiology   Major procedures and Radiology Reports - PLEASE review detailed and final reports for all details, in brief -      Dg Chest 2 View  Result Date: 09/07/2016 CLINICAL DATA:  Shortness of breath. EXAM: CHEST  2 VIEW COMPARISON:  08/07/2016. FINDINGS: The heart is enlarged. There is increasing interstitial pulmonary edema and increased small BILATERAL effusions when compared to the previous radiograph. Calcified tortuous thoracic aorta. Prominent central hilar markings are vascular in nature. Bones are unremarkable. Stable aortic stent. IMPRESSION: Cardiomegaly with alert pulmonary edema. Worsening aeration since April. Electronically Signed   By: Staci Righter M.D.   On: 09/07/2016 19:30    Micro Results     No results found for this or any previous visit (from the past 240 hour(s)).     Today   Subjective:   Peter [name] today has no headache,no chest abdominal pain,no new  weakness tingling or numbness, feels much better wants to go home today.   Objective:   Blood pressure (!) 154/74, pulse 64, temperature 98.2 F (36.8 C), temperature source Oral, resp. rate 17, height 5\' 11"  (1.803 m), weight 97.8 kg (215 lb 9.6 oz), SpO2 94 %.   Intake/Output Summary (Last 24 hours) at 09/10/16 1312 Last data filed at 09/10/16 1304  Gross per 24 hour  Intake              603 ml  Output             2400 ml  Net            -1797 ml    Exam Awake Alert, Oriented x 3, No new F.N deficits, Normal affect [place].AT,PERRAL Supple Neck,No JVD, No cervical lymphadenopathy appriciated.  Symmetrical Chest wall movement, Good air movement bilaterally, CTAB RRR,No Gallops,Rubs or new Murmurs, No Parasternal Heave +ve B.Sounds, Abd Soft, Non tender, No organomegaly appriciated, No rebound -guarding or rigidity. No Cyanosis, Clubbing or edema, No new Rash or bruise  Data Review   CBC w Diff: Lab Results  Component Value Date   WBC 6.4 09/07/2016   HGB 11.0 (L) 09/07/2016   HGB 10.5 (L) 12/12/2013   HCT 33.3 (L) 09/07/2016   HCT 31.8 (L) 12/12/2013   PLT 166 09/07/2016   PLT 211 12/12/2013   LYMPHOPCT 21 08/07/2016   LYMPHOPCT 23.3 12/12/2013   MONOPCT 11 08/07/2016   MONOPCT 10.3 12/12/2013   EOSPCT 1 08/07/2016   EOSPCT 2.4 12/12/2013   BASOPCT 1 08/07/2016   BASOPCT 0.3 12/12/2013    CMP: Lab Results  Component Value Date   NA 140 09/10/2016   NA 142 12/12/2013   K 3.5 09/10/2016   K 3.2 (L) 12/12/2013   CL 103 09/10/2016   CL 106 12/12/2013   CO2 30 09/10/2016   CO2 29 12/12/2013   BUN 21 (H) 09/10/2016   BUN 20 (H) 12/12/2013   CREATININE 1.52 (H) 09/10/2016   CREATININE 1.34 (H) 12/12/2013   PROT 5.9 (L) 06/20/2015   PROT 6.6 12/09/2013   ALBUMIN 3.3 (L) 06/20/2015   ALBUMIN 3.0 (L) 12/09/2013   BILITOT 0.4 06/20/2015   BILITOT 0.3 12/09/2013   ALKPHOS 129 (H) 06/20/2015   ALKPHOS 172 (H) 12/09/2013   AST 20 06/20/2015   AST 16 12/09/2013    ALT 11 (L) 06/20/2015   ALT 14 12/09/2013  .   Total Time in preparing paper work, data evaluation and todays exam - 35 minutes  Mikaya Bunner M.D on 09/10/2016 at  1:12 PM    Note: This dictation was prepared with Dragon dictation along with smaller phrase technology. Any transcriptional errors that result from this process are unintentional.

## 2016-09-10 NOTE — Progress Notes (Signed)
Patient called out and stated he felt short of breath. Appeared in no distress and able to speak in complete sentences with no difficulty. O2 SATs checked, 97% on RA. Patient requested to wear oxygen via [place] for a few minutes so this was set up. Patient states he feels much better.  Will continue to monitor.

## 2016-09-11 ENCOUNTER — Other Ambulatory Visit: Payer: Self-pay | Admitting: *Deleted

## 2016-09-11 ENCOUNTER — Encounter: Payer: Self-pay | Admitting: *Deleted

## 2016-09-11 NOTE — Patient Outreach (Addendum)
[facility]) Care Management [place] Telephone Outreach, Transition of Care attempt #1  09/11/2016  Peter [name] [birth year] [id number]  Successful telephone outreach to Peter [name], 76 y/o male referred to [facility] for transition of care after recent hospitalization may 28-31, 2018 for shortness of breath, dyspnea on exertion, acute on chronic CHF exacerbation.  Patient has history including, but not limited to, CHF with EF 35-40%, HTN, HLD, CAD with previous MI, PVD, and GERD.  HIPAA/ identity verified with patient during phone call today.  [place] services were thoroughly discussed with patient today, and patient pleasantly declined all services, future calls/ possibility of home visit, stating that he has "everything in place," and "too many people already involved" in his ongoing care.  Patient was willing to take my direct phone number today should he reconsider and wish to participate in the future.  Plan:  Will notify patient's PCP that patient declined [place] services today.  Oneta Rack, RN, BSN, [employer] [facility] Care Management  [phone]

## 2016-09-15 ENCOUNTER — Ambulatory Visit: Payer: PPO | Attending: Family | Admitting: Family

## 2016-09-15 ENCOUNTER — Encounter: Payer: Self-pay | Admitting: Family

## 2016-09-15 VITALS — BP 146/55 | HR 71 | Resp 20 | Ht 70.0 in | Wt 217.1 lb

## 2016-09-15 DIAGNOSIS — Z79899 Other long term (current) drug therapy: Secondary | ICD-10-CM | POA: Insufficient documentation

## 2016-09-15 DIAGNOSIS — J45909 Unspecified asthma, uncomplicated: Secondary | ICD-10-CM | POA: Diagnosis not present

## 2016-09-15 DIAGNOSIS — I5022 Chronic systolic (congestive) heart failure: Secondary | ICD-10-CM

## 2016-09-15 DIAGNOSIS — I110 Hypertensive heart disease with heart failure: Secondary | ICD-10-CM | POA: Diagnosis not present

## 2016-09-15 DIAGNOSIS — F17210 Nicotine dependence, cigarettes, uncomplicated: Secondary | ICD-10-CM | POA: Diagnosis not present

## 2016-09-15 DIAGNOSIS — R252 Cramp and spasm: Secondary | ICD-10-CM | POA: Diagnosis not present

## 2016-09-15 DIAGNOSIS — I2510 Atherosclerotic heart disease of native coronary artery without angina pectoris: Secondary | ICD-10-CM | POA: Insufficient documentation

## 2016-09-15 DIAGNOSIS — Z09 Encounter for follow-up examination after completed treatment for conditions other than malignant neoplasm: Secondary | ICD-10-CM | POA: Diagnosis not present

## 2016-09-15 DIAGNOSIS — E876 Hypokalemia: Secondary | ICD-10-CM | POA: Diagnosis not present

## 2016-09-15 DIAGNOSIS — I252 Old myocardial infarction: Secondary | ICD-10-CM | POA: Diagnosis not present

## 2016-09-15 DIAGNOSIS — Z7982 Long term (current) use of aspirin: Secondary | ICD-10-CM | POA: Insufficient documentation

## 2016-09-15 DIAGNOSIS — E785 Hyperlipidemia, unspecified: Secondary | ICD-10-CM | POA: Diagnosis not present

## 2016-09-15 DIAGNOSIS — Z720 Tobacco use: Secondary | ICD-10-CM

## 2016-09-15 DIAGNOSIS — I509 Heart failure, unspecified: Secondary | ICD-10-CM | POA: Insufficient documentation

## 2016-09-15 DIAGNOSIS — I10 Essential (primary) hypertension: Secondary | ICD-10-CM | POA: Diagnosis not present

## 2016-09-15 NOTE — Progress Notes (Signed)
Patient ID: Peter [name], male    DOB: [birth year], 76 y.o.   MRN: [id number]  HPI  Peter [name] is a 76 y/o male with a history of asthma, CAD, hyperlipidemia, HTN, hypokalemia, MI, NSTEMI, multiple stents, current tobacco use and chronic heart failure.   Reviewed last echo report on 08/08/16 which showed an EF of 35-40% along with mild AR and moderate Peter/TR. EF has declined from a previous echo done 12/15/11. Cardiac catheterization done 08/10/16 showed one vessel CAD with high-grade in stent restenosis in the proximal LAD. DES successfully placed in proximal LAD.  Last admitted 09/10/16 with HF exacerbation. Given IV diuretics and transitioned to oral diuretics with a weight loss of 4 kg. Discharged home after 4 days. Admitted 08/07/16 with acute HF along with CAD. Initially treated with IV diuretics and transitioned to oral diuretics. Lost ~ 6.2L of fluid during hospitalization. Cardiology consult obtained and catheterization done. Discharged home after 4 days.   He presents today for a follow-up visit today with a chief compliant of leg swelling with pedal edema. Denies any weight changes. He has some associated cough, congestion, and postnasal drip.   Past Medical History:  Diagnosis Date  . Anginal pain ([place])   . Asthma   . Bladder infection, acute 03/2011   "had a whole lot of bleeding from this"  . Coronary artery disease   . High cholesterol   . Hypertension   . 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 Myocardial infarction ([place]) 1996  . NSTEMI (non-ST elevated myocardial infarction) ([place]) 12/14/2011  . Shortness of breath 12/14/2011   "@ rest, lying down, w/exertion"   Past Surgical History:  Procedure Laterality Date  . [place]; ~ 2003; ~ 2008   "total of 3"  . CORONARY STENT INTERVENTION N/A 08/10/2016   Procedure: Coronary Stent Intervention;  Surgeon: [name], MD;  Location: [facility];  Service: Cardiovascular;  Laterality: N/A;  . HERNIA REPAIR  ~ 2001   "abdominal w/mesh implanted"  . LEFT HEART CATH AND CORONARY ANGIOGRAPHY N/A 08/10/2016   Procedure: Left Heart Cath and Coronary Angiography;  Surgeon: [name], MD;  Location: [facility];  Service: Cardiovascular;  Laterality: N/A;  . LEFT HEART CATHETERIZATION WITH CORONARY ANGIOGRAM N/A 12/16/2011   Procedure: LEFT HEART CATHETERIZATION WITH CORONARY ANGIOGRAM;  Surgeon: Minus Breeding, MD;  Location: [facility];  Service: Cardiovascular;  Laterality: N/A;  . PERCUTANEOUS CORONARY STENT INTERVENTION (PCI-S) N/A 12/17/2011   Procedure: PERCUTANEOUS CORONARY STENT INTERVENTION (PCI-S);  Surgeon: Sherren Mocha, MD;  Location: [facility];  Service: Cardiovascular;  Laterality: N/A;  . TONSILLECTOMY     "I was a kid"   Family History  Problem Relation Age of Onset  . Family history unknown: Yes   Social History  Substance Use Topics  . Smoking status: Current Every Day Smoker    Packs/day: 1.00    Years: 53.00    Types: Cigarettes  . Smokeless tobacco: Never Used  . Alcohol use 0.6 oz/week    1 Cans of beer per week     Comment: 12/14/2011 "if my kidneys are sore, I drink 1 beer/day; if not sore; no beer; occasionally drink socially; ave 1 beer/wk maybe"   Allergies  Allergen Reactions  . Iodinated Diagnostic Agents Shortness Of Breath  . Iodinated Glycerol  [Glycerol, Iodinated] Shortness Of Breath   Prior to  Admission medications   Medication Sig Start Date End Date Taking? Authorizing Provider  amLODipine-benazepril (LOTREL) 10-40 MG capsule TAKE 1 CAPSULE BY MOUTH EVERY DAY 10/21/15  Yes [provider]  aspirin 325 MG tablet Take 325 mg by mouth 2 (two) times daily. Patient takes if there is any pain or burning in the chest.   Yes [provider]  atorvastatin (LIPITOR) 40 MG tablet Take 1 tablet (40 mg total) by mouth daily at 6 PM. 12/18/11  Yes Annita Brod, MD  carvedilol (COREG) 3.125 MG tablet Take 3.125 mg by mouth 2 (two) times daily with a meal.   Yes [provider]  cetirizine (WAL-ZYR) 10 MG tablet Take 10 mg by mouth.   Yes [provider]  clobetasol cream (TEMOVATE) 0.05 % APPLY TO PSORIASIS BID PRN UNTIL SMOOTH 11/05/14  Yes [provider]  clopidogrel (PLAVIX) 75 MG tablet TAKE 1 TABLET(75 MG) BY MOUTH EVERY DAY 08/11/16  Yes Fritzi Mandes, MD  cyanocobalamin 1000 MCG tablet Take 1,000 mcg by mouth daily.    Yes [provider]  furosemide (LASIX) 20 MG tablet Take 1 tablet (20 mg total) by mouth daily. 08/12/16  Yes Fritzi Mandes, MD  GLUCOSAMINE-CHONDROITIN-VIT C PO Take 1 tablet by mouth daily.   Yes [provider]  hydrochlorothiazide (HYDRODIURIL) 12.5 MG tablet Take 12.5 mg by mouth daily.  04/22/15  Yes [provider]  isosorbide mononitrate (IMDUR) 30 MG 24 hr tablet Take 30 mg by mouth daily. 06/18/16  Yes [provider]  omeprazole (PRILOSEC) 20 MG capsule Take by mouth. 01/16/16  Yes [provider]  potassium chloride SA (K-DUR,KLOR-CON) 20 MEQ tablet TAKE 2 TABLETS BY MOUTH TWICE DAILY 12/13/15  Yes [provider]  tamsulosin (FLOMAX) 0.4 MG CAPS capsule TAKE 1 CAPSULE BY MOUTH EVERY DAY 10/28/15  Yes [provider]   Review of Systems  Constitutional: Negative for appetite change and fatigue.  HENT: Positive for congestion and postnasal drip. Negative for sore throat.   Eyes: Negative.   Respiratory: Positive for cough (Pure white) and shortness of breath (when walking long distances). Negative for chest tightness.   Cardiovascular: Positive for leg swelling. Negative for chest pain and palpitations.  Gastrointestinal: Negative for abdominal distention and abdominal pain.  Endocrine: Negative.   Genitourinary: Negative.   Musculoskeletal: Negative for back pain and neck pain.  Skin: Negative.   Allergic/Immunologic: Negative.    Neurological: Negative for dizziness and light-headedness.  Hematological: Negative for adenopathy. Bruises/bleeds easily.  Psychiatric/Behavioral: Positive for sleep disturbance (sleeping on 2 pillow). Negative for dysphoric mood. The patient is not nervous/anxious.    Vitals:   09/15/16 1039  BP: (!) 146/55  Pulse: 71  Resp: 20  SpO2: 99%  Weight: 217 lb 2 oz (98.5 kg)  Height: 5\' 10"  (1.778 m)   Wt Readings from Last 3 Encounters:  09/15/16 217 lb 2 oz (98.5 kg)  09/10/16 215 lb 9.6 oz (97.8 kg)  08/18/16 223 lb 8 oz (101.4 kg)   Lab Results  Component Value Date   CREATININE 1.52 (H) 09/10/2016   CREATININE 1.51 (H) 09/09/2016   CREATININE 1.28 (H) 09/08/2016    Physical Exam  Constitutional: He is oriented to person, place, and time. He appears well-developed and well-nourished.  HENT:  Head: Normocephalic and atraumatic.  Neck: Normal range of motion. Neck supple. No JVD present.  Cardiovascular: Normal rate and regular rhythm.   Pulmonary/Chest: Effort normal. He has no wheezes. He has  no rales.  Abdominal: Soft. He exhibits no distension. There is no tenderness.  Musculoskeletal: He exhibits edema (1+ pitting edema in bilateral lower legs). He exhibits no tenderness.  Neurological: He is alert and oriented to person, place, and time.  Skin: Skin is warm and dry.  Psychiatric: He has a normal mood and affect. His behavior is normal. Thought content normal.  Nursing note and vitals reviewed.   Assessment & Plan:  1: Chronic heart failure with reduced ejection fraction- - NYHA class II - euvolemic today - weighing daily. Instructed to call for an overnight weight gain of >2 pounds or a weekly weight gain of >5 pounds - not adding salt and has been reading food labels. Has been using a potassium seasoning instead of salt. Reviewed the importance of closely following a 2000mg  sodium diet and written dietary information was given to him.  - spoke to patient about  starting Entresto and stopping Lotrel. He was given a pamphlet on Entresto and will talk to Dr. Clayborn Bigness on Thursday about this - cardiologist (Dr. Clayborn Bigness) appointment scheduled on 09/17/16  2: HTN- - BP 146/55; no medications this morning - saw PCP (Hande) this morning  3: Tobacco use- - 1/2 ppd which is down from 1 ppd of cigarettes - he is wanting to quit. Complete cessation discussed for 3 minutes with him   Medication list was reviewed.  Return in 3 month or sooner for any questions/problems before then.

## 2016-09-15 NOTE — Patient Instructions (Signed)
Continue weighing daily and call for an overnight weight gain of > 2 pounds or a weekly weight gain of >5 pounds. 

## 2016-09-17 DIAGNOSIS — R0602 Shortness of breath: Secondary | ICD-10-CM | POA: Diagnosis not present

## 2016-09-17 DIAGNOSIS — I209 Angina pectoris, unspecified: Secondary | ICD-10-CM | POA: Diagnosis not present

## 2016-09-17 DIAGNOSIS — I200 Unstable angina: Secondary | ICD-10-CM | POA: Diagnosis not present

## 2016-09-17 DIAGNOSIS — R0609 Other forms of dyspnea: Secondary | ICD-10-CM | POA: Diagnosis not present

## 2016-09-17 DIAGNOSIS — I2510 Atherosclerotic heart disease of native coronary artery without angina pectoris: Secondary | ICD-10-CM | POA: Diagnosis not present

## 2016-09-17 DIAGNOSIS — Z125 Encounter for screening for malignant neoplasm of prostate: Secondary | ICD-10-CM | POA: Diagnosis not present

## 2016-09-17 DIAGNOSIS — I10 Essential (primary) hypertension: Secondary | ICD-10-CM | POA: Diagnosis not present

## 2016-09-17 DIAGNOSIS — E669 Obesity, unspecified: Secondary | ICD-10-CM | POA: Diagnosis not present

## 2016-09-17 DIAGNOSIS — I5023 Acute on chronic systolic (congestive) heart failure: Secondary | ICD-10-CM | POA: Diagnosis not present

## 2016-09-17 DIAGNOSIS — Z955 Presence of coronary angioplasty implant and graft: Secondary | ICD-10-CM | POA: Diagnosis not present

## 2016-09-17 DIAGNOSIS — Z09 Encounter for follow-up examination after completed treatment for conditions other than malignant neoplasm: Secondary | ICD-10-CM | POA: Diagnosis not present

## 2016-09-17 DIAGNOSIS — R002 Palpitations: Secondary | ICD-10-CM | POA: Diagnosis not present

## 2016-09-17 DIAGNOSIS — Z720 Tobacco use: Secondary | ICD-10-CM | POA: Diagnosis not present

## 2016-09-17 DIAGNOSIS — I5022 Chronic systolic (congestive) heart failure: Secondary | ICD-10-CM | POA: Diagnosis not present

## 2016-09-17 DIAGNOSIS — G4733 Obstructive sleep apnea (adult) (pediatric): Secondary | ICD-10-CM | POA: Diagnosis not present

## 2016-09-17 DIAGNOSIS — E785 Hyperlipidemia, unspecified: Secondary | ICD-10-CM | POA: Diagnosis not present

## 2016-09-22 ENCOUNTER — Ambulatory Visit: Payer: PRIVATE HEALTH INSURANCE | Admitting: Family

## 2016-10-01 DIAGNOSIS — Z955 Presence of coronary angioplasty implant and graft: Secondary | ICD-10-CM | POA: Diagnosis not present

## 2016-10-01 DIAGNOSIS — I2510 Atherosclerotic heart disease of native coronary artery without angina pectoris: Secondary | ICD-10-CM | POA: Diagnosis not present

## 2016-10-01 DIAGNOSIS — I5023 Acute on chronic systolic (congestive) heart failure: Secondary | ICD-10-CM | POA: Diagnosis not present

## 2016-10-06 DIAGNOSIS — Z9889 Other specified postprocedural states: Secondary | ICD-10-CM | POA: Diagnosis not present

## 2016-10-06 DIAGNOSIS — R002 Palpitations: Secondary | ICD-10-CM | POA: Diagnosis not present

## 2016-10-06 DIAGNOSIS — R0609 Other forms of dyspnea: Secondary | ICD-10-CM | POA: Diagnosis not present

## 2016-10-06 DIAGNOSIS — I209 Angina pectoris, unspecified: Secondary | ICD-10-CM | POA: Diagnosis not present

## 2016-10-06 DIAGNOSIS — E785 Hyperlipidemia, unspecified: Secondary | ICD-10-CM | POA: Diagnosis not present

## 2016-10-06 DIAGNOSIS — E669 Obesity, unspecified: Secondary | ICD-10-CM | POA: Diagnosis not present

## 2016-10-06 DIAGNOSIS — F17200 Nicotine dependence, unspecified, uncomplicated: Secondary | ICD-10-CM | POA: Diagnosis not present

## 2016-10-06 DIAGNOSIS — I200 Unstable angina: Secondary | ICD-10-CM | POA: Diagnosis not present

## 2016-10-06 DIAGNOSIS — G4733 Obstructive sleep apnea (adult) (pediatric): Secondary | ICD-10-CM | POA: Diagnosis not present

## 2016-10-06 DIAGNOSIS — I739 Peripheral vascular disease, unspecified: Secondary | ICD-10-CM | POA: Diagnosis not present

## 2016-10-06 DIAGNOSIS — I10 Essential (primary) hypertension: Secondary | ICD-10-CM | POA: Diagnosis not present

## 2016-10-06 DIAGNOSIS — I2510 Atherosclerotic heart disease of native coronary artery without angina pectoris: Secondary | ICD-10-CM | POA: Diagnosis not present

## 2016-10-07 DIAGNOSIS — H43811 Vitreous degeneration, right eye: Secondary | ICD-10-CM | POA: Diagnosis not present

## 2016-10-19 ENCOUNTER — Encounter: Payer: Self-pay | Admitting: *Deleted

## 2016-10-19 ENCOUNTER — Encounter: Payer: PPO | Attending: Cardiology | Admitting: *Deleted

## 2016-10-19 VITALS — Ht 70.75 in | Wt 210.9 lb

## 2016-10-19 DIAGNOSIS — I110 Hypertensive heart disease with heart failure: Secondary | ICD-10-CM | POA: Insufficient documentation

## 2016-10-19 DIAGNOSIS — Z7902 Long term (current) use of antithrombotics/antiplatelets: Secondary | ICD-10-CM | POA: Diagnosis not present

## 2016-10-19 DIAGNOSIS — F17210 Nicotine dependence, cigarettes, uncomplicated: Secondary | ICD-10-CM | POA: Diagnosis not present

## 2016-10-19 DIAGNOSIS — Z79899 Other long term (current) drug therapy: Secondary | ICD-10-CM | POA: Diagnosis not present

## 2016-10-19 DIAGNOSIS — Z7982 Long term (current) use of aspirin: Secondary | ICD-10-CM | POA: Diagnosis not present

## 2016-10-19 DIAGNOSIS — I5022 Chronic systolic (congestive) heart failure: Secondary | ICD-10-CM | POA: Insufficient documentation

## 2016-10-19 DIAGNOSIS — I252 Old myocardial infarction: Secondary | ICD-10-CM | POA: Diagnosis not present

## 2016-10-19 DIAGNOSIS — I2510 Atherosclerotic heart disease of native coronary artery without angina pectoris: Secondary | ICD-10-CM | POA: Diagnosis not present

## 2016-10-19 DIAGNOSIS — H3530 Unspecified macular degeneration: Secondary | ICD-10-CM | POA: Insufficient documentation

## 2016-10-19 DIAGNOSIS — E7800 Pure hypercholesterolemia, unspecified: Secondary | ICD-10-CM | POA: Insufficient documentation

## 2016-10-19 DIAGNOSIS — J45909 Unspecified asthma, uncomplicated: Secondary | ICD-10-CM | POA: Insufficient documentation

## 2016-10-19 DIAGNOSIS — H35 Background retinopathy and retinal vascular changes: Secondary | ICD-10-CM | POA: Diagnosis not present

## 2016-10-19 DIAGNOSIS — Z955 Presence of coronary angioplasty implant and graft: Secondary | ICD-10-CM | POA: Insufficient documentation

## 2016-10-19 NOTE — Patient Instructions (Signed)
Patient Instructions  Patient Details  Name: Peter [name] MRN: [id number] Date of Birth: January 02, 1941 Referring Provider:  [name], MD  Below are the personal goals you chose as well as exercise and nutrition goals. Our goal is to help you keep on track towards obtaining and maintaining your goals. We will be discussing your progress on these goals with you throughout the program.  Initial Exercise Prescription:     Initial Exercise Prescription - 10/19/16 1400      Date of Initial Exercise RX and Referring Provider   Date 10/19/16   Referring Provider [name]     Treadmill   MPH 1.7  3/3/3   Grade 0   Minutes 15   METs 2.3     Recumbant Bike   Level 1   RPM 60   Minutes 15   METs 2     REL-XR   Level 1   Speed 60   Minutes 15   METs 2.3     T5 Nustep   Level 1   SPM 80   Minutes 15   METs 2     Prescription Details   Frequency (times per week) 3   Duration Progress to 30 minutes of continuous aerobic without signs/symptoms of physical distress     Intensity   THRR 40-80% of Max Heartrate 109-132   Ratings of Perceived Exertion 11-13   Perceived Dyspnea 0-4     Resistance Training   Training Prescription Yes   Weight 3   Reps 10-15      Exercise Goals: Frequency: Be able to perform aerobic exercise three times per week working toward 3-5 days per week.  Intensity: Work with a perceived exertion of 11 (fairly light) - 15 (hard) as tolerated. Follow your new exercise prescription and watch for changes in prescription as you progress with the program. Changes will be reviewed with you when they are made.  Duration: You should be able to do 30 minutes of continuous aerobic exercise in addition to a 5 minute warm-up and a 5 minute cool-down routine.  Nutrition Goals: Your personal nutrition goals will be established when you do your nutrition analysis with the dietician.  The following are nutrition guidelines to follow: Cholesterol <  200mg /day Sodium < 1500mg /day Fiber: Men over 50 yrs - 30 grams per day  Personal Goals:     Personal Goals and Risk Factors at Admission - 10/19/16 1524      Core Components/Risk Factors/Patient Goals on Admission    Weight Management Yes;Weight Maintenance   Admit Weight 210 lb 14.4 oz (95.7 kg)   Goal Weight: Short Term 210 lb (95.3 kg)   Goal Weight: Long Term 210 lb (95.3 kg)   Expected Outcomes Short Term: Continue to assess and modify interventions until short term weight is achieved;Long Term: Adherence to nutrition and physical activity/exercise program aimed toward attainment of established weight goal;Weight Maintenance: Understanding of the daily nutrition guidelines, which includes 25-35% calories from fat, 7% or less cal from saturated fats, less than 200mg  cholesterol, less than 1.5gm of sodium, & 5 or more servings of fruits and vegetables daily   Intervention Assist the participant in steps to quit. Provide individualized education and counseling about committing to Tobacco Cessation, relapse prevention, and pharmacological support that can be provided by physician.;Advice worker, assist with locating and accessing local/national Quit Smoking programs, and support quit date choice.   Expected Outcomes Short Term: Will demonstrate readiness to quit, by selecting a quit date.;Short  Term: Will quit all tobacco product use, adhering to prevention of relapse plan.;Long Term: Complete abstinence from all tobacco products for at least 12 months from quit date.   Hypertension Yes   Intervention Provide education on lifestyle modifcations including regular physical activity/exercise, weight management, moderate sodium restriction and increased consumption of fresh fruit, vegetables, and low fat dairy, alcohol moderation, and smoking cessation.;Monitor prescription use compliance.   Expected Outcomes Short Term: Continued assessment and intervention until BP is < 140/88mm HG  in hypertensive participants. < 130/25mm HG in hypertensive participants with diabetes, heart failure or chronic kidney disease.;Long Term: Maintenance of blood pressure at goal levels.   Lipids Yes   Intervention Provide education and support for participant on nutrition & aerobic/resistive exercise along with prescribed medications to achieve LDL 70mg , HDL >40mg .   Expected Outcomes Short Term: Participant states understanding of desired cholesterol values and is compliant with medications prescribed. Participant is following exercise prescription and nutrition guidelines.;Long Term: Cholesterol controlled with medications as prescribed, with individualized exercise RX and with personalized nutrition plan. Value goals: LDL < 70mg , HDL > 40 mg.      Tobacco Use Initial Evaluation: History  Smoking Status  . Current Every Day Smoker  . Packs/day: 0.50  . Years: 0.50  . Types: Cigarettes  Smokeless Tobacco  . Never Used    Copy of goals given to participant.

## 2016-10-19 NOTE — Progress Notes (Signed)
Cardiac Individual Treatment Plan  Patient Details  Name: Peter [name] MRN: [id number] Date of Birth: 1940-05-10 Referring Provider:     Cardiac Rehab from 10/19/2016 in [facility] Cardiac and Pulmonary Rehab  Referring Provider  [name]      Initial Encounter Date:    Cardiac Rehab from 10/19/2016 in [facility] Cardiac and Pulmonary Rehab  Date  10/19/16  Referring Provider  [name]      Visit Diagnosis: Heart failure, chronic systolic (HCC)  Status post coronary artery stent placement  Patient's Home Medications on Admission:  Current Outpatient Prescriptions:  .  amLODipine-benazepril (LOTREL) 10-40 MG capsule, TAKE 1 CAPSULE BY MOUTH EVERY DAY, Disp: , Rfl:  .  aspirin 325 MG tablet, Take 325 mg by mouth 2 (two) times daily. Patient takes if there is any pain or burning in the chest., Disp: , Rfl:  .  atorvastatin (LIPITOR) 40 MG tablet, Take 1 tablet (40 mg total) by mouth daily at 6 PM., Disp: 30 tablet, Rfl: 0 .  carvedilol (COREG) 3.125 MG tablet, Take 3.125 mg by mouth 2 (two) times daily with a meal., Disp: , Rfl:  .  cetirizine (WAL-ZYR) 10 MG tablet, Take 10 mg by mouth., Disp: , Rfl:  .  clobetasol cream (TEMOVATE) 0.05 %, APPLY TO PSORIASIS BID PRN UNTIL SMOOTH, Disp: , Rfl: 1 .  clopidogrel (PLAVIX) 75 MG tablet, TAKE 1 TABLET(75 MG) BY MOUTH EVERY DAY, Disp: 30 tablet, Rfl: 1 .  cyanocobalamin 1000 MCG tablet, Take 1,000 mcg by mouth daily. , Disp: , Rfl:  .  furosemide (LASIX) 20 MG tablet, Take 1 tablet (20 mg total) by mouth daily. (Patient taking differently: Take 20 mg by mouth 2 (two) times daily. ), Disp: 30 tablet, Rfl: 1 .  GLUCOSAMINE-CHONDROITIN-VIT C PO, Take 1 tablet by mouth daily., Disp: , Rfl:  .  hydrochlorothiazide (HYDRODIURIL) 12.5 MG tablet, Take 12.5 mg by mouth daily. , Disp: , Rfl:  .  isosorbide mononitrate (IMDUR) 30 MG 24 hr tablet, Take 30 mg by mouth daily., Disp: , Rfl:  .  omeprazole (PRILOSEC) 20 MG capsule, Take by mouth., Disp: , Rfl:  .   spironolactone (ALDACTONE) 25 MG tablet, Take 0.5 tablets by mouth daily., Disp: , Rfl:  .  tamsulosin (FLOMAX) 0.4 MG CAPS capsule, TAKE 1 CAPSULE BY MOUTH EVERY DAY, Disp: , Rfl:  .  potassium chloride SA (K-DUR,KLOR-CON) 20 MEQ tablet, TAKE 2 TABLETS BY MOUTH TWICE DAILY, Disp: , Rfl:   Past Medical History: Past Medical History:  Diagnosis Date  . Anginal pain ([place])   . Asthma   . Bladder infection, acute 03/2011   "had a whole lot of bleeding from this"  . Coronary artery disease   . High cholesterol   . Hypertension   . 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 Myocardial infarction ([place]) 1996  . NSTEMI (non-ST elevated myocardial infarction) ([place]) 12/14/2011  . Shortness of breath 12/14/2011   "@ rest, lying down, w/exertion"    Tobacco Use: History  Smoking Status  . Current Every Day Smoker  . Packs/day: 0.50  . Years: 0.50  . Types: Cigarettes  Smokeless Tobacco  . Never Used    Labs: Recent Review Flowsheet Data    Labs for ITP Cardiac and Pulmonary Rehab Latest Ref Rng & Units 12/16/2011 08/08/2016   Cholestrol 0 - 200 mg/dL 141 108   LDLCALC 0 - 99 mg/dL 83 50  HDL >40 mg/dL 38(L) 36(L)   Trlycerides <150 mg/dL 102 111       Exercise Target Goals: Date: 10/19/16  Exercise Program Goal: Individual exercise prescription set with THRR, safety & activity barriers. Participant demonstrates ability to understand and report RPE using BORG scale, to self-measure pulse accurately, and to acknowledge the importance of the exercise prescription.  Exercise Prescription Goal: Starting with aerobic activity 30 plus minutes a day, 3 days per week for initial exercise prescription. Provide home exercise prescription and guidelines that participant acknowledges understanding prior to discharge.  Activity Barriers & Risk Stratification:   6 Minute Walk:     6 Minute Walk    Row Name 10/19/16 1406         6 Minute Walk    Phase Initial     Distance 1200 feet     Walk Time 6 minutes     # of Rest Breaks 0     MPH 2.27     METS 2.29     RPE 12     VO2 Peak 8.02     Symptoms Yes (comment)     Comments hip pain 5/10     Resting HR 87 bpm     Resting BP 148/64     Max Ex. HR 91 bpm     Max Ex. BP 140/80        Oxygen Initial Assessment:   Oxygen Re-Evaluation:   Oxygen Discharge (Final Oxygen Re-Evaluation):   Initial Exercise Prescription:     Initial Exercise Prescription - 10/19/16 1400      Date of Initial Exercise RX and Referring Provider   Date 10/19/16   Referring Provider [name]     Treadmill   MPH 1.7  3/3/3   Grade 0   Minutes 15   METs 2.3     Recumbant Bike   Level 1   RPM 60   Minutes 15   METs 2     REL-XR   Level 1   Speed 60   Minutes 15   METs 2.3     T5 Nustep   Level 1   SPM 80   Minutes 15   METs 2     Prescription Details   Frequency (times per week) 3   Duration Progress to 30 minutes of continuous aerobic without signs/symptoms of physical distress     Intensity   THRR 40-80% of Max Heartrate 109-132   Ratings of Perceived Exertion 11-13   Perceived Dyspnea 0-4     Resistance Training   Training Prescription Yes   Weight 3   Reps 10-15      Perform Capillary Blood Glucose checks as needed.  Exercise Prescription Changes:     Exercise Prescription Changes    Row Name 10/19/16 1300             Response to Exercise   Blood Pressure (Admit) 148/64       Blood Pressure (Exercise) 140/80       Heart Rate (Admit) 80 bpm       Heart Rate (Exit) 83 bpm       Rating of Perceived Exertion (Exercise) 12          Exercise Comments:   Exercise Goals and Review:     Exercise Goals    Row Name 10/19/16 1408             Exercise Goals   Increase Physical Activity Yes  Intervention Provide advice, education, support and counseling about physical activity/exercise needs.;Develop an individualized exercise  prescription for aerobic and resistive training based on initial evaluation findings, risk stratification, comorbidities and participant's personal goals.       Expected Outcomes Achievement of increased cardiorespiratory fitness and enhanced flexibility, muscular endurance and strength shown through measurements of functional capacity and personal statement of participant.       Increase Strength and Stamina Yes       Intervention Provide advice, education, support and counseling about physical activity/exercise needs.;Develop an individualized exercise prescription for aerobic and resistive training based on initial evaluation findings, risk stratification, comorbidities and participant's personal goals.       Expected Outcomes Achievement of increased cardiorespiratory fitness and enhanced flexibility, muscular endurance and strength shown through measurements of functional capacity and personal statement of participant.          Exercise Goals Re-Evaluation :   Discharge Exercise Prescription (Final Exercise Prescription Changes):     Exercise Prescription Changes - 10/19/16 1300      Response to Exercise   Blood Pressure (Admit) 148/64   Blood Pressure (Exercise) 140/80   Heart Rate (Admit) 80 bpm   Heart Rate (Exit) 83 bpm   Rating of Perceived Exertion (Exercise) 12      Nutrition:  Target Goals: Understanding of nutrition guidelines, daily intake of sodium 1500mg , cholesterol 200mg , calories 30% from fat and 7% or less from saturated fats, daily to have 5 or more servings of fruits and vegetables.  Biometrics:     Pre Biometrics - 10/19/16 1405      Pre Biometrics   Height 5' 10.75" (1.797 m)   Weight 210 lb 14.4 oz (95.7 kg)   BMI (Calculated) 29.7       Nutrition Therapy Plan and Nutrition Goals:     Nutrition Therapy & Goals - 10/19/16 1339      Intervention Plan   Intervention Prescribe, educate and counsel regarding individualized specific dietary  modifications aiming towards targeted core components such as weight, hypertension, lipid management, diabetes, heart failure and other comorbidities.   Expected Outcomes Short Term Goal: Understand basic principles of dietary content, such as calories, fat, sodium, cholesterol and nutrients.;Short Term Goal: A plan has been developed with personal nutrition goals set during dietitian appointment.;Long Term Goal: Adherence to prescribed nutrition plan.      Nutrition Discharge: Rate Your Plate Scores:     Nutrition Assessments - 10/19/16 1339      MEDFICTS Scores   Pre Score 61      Nutrition Goals Re-Evaluation:   Nutrition Goals Discharge (Final Nutrition Goals Re-Evaluation):   Psychosocial: Target Goals: Acknowledge presence or absence of significant depression and/or stress, maximize coping skills, provide positive support system. Participant is able to verbalize types and ability to use techniques and skills needed for reducing stress and depression.   Initial Review & Psychosocial Screening:     Initial Psych Review & Screening - 10/19/16 1344      Initial Review   Current issues with None Identified     Family Dynamics   Good Support System? Yes  Good friend Nelida Meuse     Barriers   Psychosocial barriers to participate in program There are no identifiable barriers or psychosocial needs.;The patient should benefit from training in stress management and relaxation.     Screening Interventions   Interventions Encouraged to exercise;Provide feedback about the scores to participant;To provide support and resources with identified psychosocial needs  Quality of Life Scores:      Quality of Life - 10/19/16 1344      Quality of Life Scores   Health/Function Pre 22.87 %   Socioeconomic Pre 23.21 %   Psych/Spiritual Pre 22.5 %   Family Pre 26.5 %   GLOBAL Pre 23.4 %      PHQ-9: Recent Review Flowsheet Data    Depression screen [facility] 2/9 10/19/2016  09/15/2016 08/18/2016   Decreased Interest 0 0 0   Down, Depressed, Hopeless 0 0 0   PHQ - 2 Score 0 0 0   Altered sleeping 0 - -   Tired, decreased energy 1 - -   Change in appetite 0 - -   Feeling bad or failure about yourself  0 - -   Trouble concentrating 0 - -   Moving slowly or fidgety/restless 0 - -   Suicidal thoughts 0 - -   PHQ-9 Score 1 - -   Difficult doing work/chores Not difficult at all - -     Interpretation of Total Score  Total Score Depression Severity:  1-4 = Minimal depression, 5-9 = Mild depression, 10-14 = Moderate depression, 15-19 = Moderately severe depression, 20-27 = Severe depression   Psychosocial Evaluation and Intervention:   Psychosocial Re-Evaluation:   Psychosocial Discharge (Final Psychosocial Re-Evaluation):   Vocational Rehabilitation: Provide vocational rehab assistance to qualifying candidates.   Vocational Rehab Evaluation & Intervention:   Education: Education Goals: Education classes will be provided on a weekly basis, covering required topics. Participant will state understanding/return demonstration of topics presented.  Learning Barriers/Preferences:     Learning Barriers/Preferences - 10/19/16 1347      Learning Barriers/Preferences   Learning Barriers None   Learning Preferences None      Education Topics: General Nutrition Guidelines/Fats and Fiber: -Group instruction provided by verbal, written material, models and posters to present the general guidelines for heart healthy nutrition. Gives an explanation and review of dietary fats and fiber.   Controlling Sodium/Reading Food Labels: -Group verbal and written material supporting the discussion of sodium use in heart healthy nutrition. Review and explanation with models, verbal and written materials for utilization of the food label.   Exercise Physiology & Risk Factors: - Group verbal and written instruction with models to review the exercise physiology of the  cardiovascular system and associated critical values. Details cardiovascular disease risk factors and the goals associated with each risk factor.   Aerobic Exercise & Resistance Training: - Gives group verbal and written discussion on the health impact of inactivity. On the components of aerobic and resistive training programs and the benefits of this training and how to safely progress through these programs.   Flexibility, Balance, General Exercise Guidelines: - Provides group verbal and written instruction on the benefits of flexibility and balance training programs. Provides general exercise guidelines with specific guidelines to those with heart or lung disease. Demonstration and skill practice provided.   Stress Management: - Provides group verbal and written instruction about the health risks of elevated stress, cause of high stress, and healthy ways to reduce stress.   Depression: - Provides group verbal and written instruction on the correlation between heart/lung disease and depressed mood, treatment options, and the stigmas associated with seeking treatment.   Anatomy & Physiology of the Heart: - Group verbal and written instruction and models provide basic cardiac anatomy and physiology, with the coronary electrical and arterial systems. Review of: AMI, Angina, Valve disease, Heart Failure, Cardiac Arrhythmia, Pacemakers, and  the ICD.   Cardiac Procedures: - Group verbal and written instruction and models to describe the testing methods done to diagnose heart disease. Reviews the outcomes of the test results. Describes the treatment choices: Medical Management, Angioplasty, or Coronary Bypass Surgery.   Cardiac Medications: - Group verbal and written instruction to review commonly prescribed medications for heart disease. Reviews the medication, class of the drug, and side effects. Includes the steps to properly store meds and maintain the prescription regimen.   Go  Sex-Intimacy & Heart Disease, Get SMART - Goal Setting: - Group verbal and written instruction through game format to discuss heart disease and the return to sexual intimacy. Provides group verbal and written material to discuss and apply goal setting through the application of the S.M.A.R.T. Method.   Other Matters of the Heart: - Provides group verbal, written materials and models to describe Heart Failure, Angina, Valve Disease, and Diabetes in the realm of heart disease. Includes description of the disease process and treatment options available to the cardiac patient.   Exercise & Equipment Safety: - Individual verbal instruction and demonstration of equipment use and safety with use of the equipment.   Cardiac Rehab from 10/19/2016 in [facility] Cardiac and Pulmonary Rehab  Date  10/19/16  Educator  Sb  Instruction Review Code  2- meets goals/outcomes      Infection Prevention: - Provides verbal and written material to individual with discussion of infection control including proper hand washing and proper equipment cleaning during exercise session.   Cardiac Rehab from 10/19/2016 in [facility] Cardiac and Pulmonary Rehab  Date  10/19/16  Educator  Sb  Instruction Review Code  2- meets goals/outcomes      Falls Prevention: - Provides verbal and written material to individual with discussion of falls prevention and safety.   Cardiac Rehab from 10/19/2016 in [facility] Cardiac and Pulmonary Rehab  Date  10/19/16  Educator  SB  Instruction Review Code  2- meets goals/outcomes      Diabetes: - Individual verbal and written instruction to review signs/symptoms of diabetes, desired ranges of glucose level fasting, after meals and with exercise. Advice that pre and post exercise glucose checks will be done for 3 sessions at entry of program.    Knowledge Questionnaire Score:     Knowledge Questionnaire Score - 10/19/16 1347      Knowledge Questionnaire Score   Pre Score 23/28  Correct responses  reviewed with Yvone Neu today. He verbalized understanding of responses and had no questions today.      Core Components/Risk Factors/Patient Goals at Admission:     Personal Goals and Risk Factors at Admission - 10/19/16 1524      Core Components/Risk Factors/Patient Goals on Admission    Weight Management Yes;Weight Maintenance   Admit Weight 210 lb 14.4 oz (95.7 kg)   Goal Weight: Short Term 210 lb (95.3 kg)   Goal Weight: Long Term 210 lb (95.3 kg)   Expected Outcomes Short Term: Continue to assess and modify interventions until short term weight is achieved;Long Term: Adherence to nutrition and physical activity/exercise program aimed toward attainment of established weight goal;Weight Maintenance: Understanding of the daily nutrition guidelines, which includes 25-35% calories from fat, 7% or less cal from saturated fats, less than 200mg  cholesterol, less than 1.5gm of sodium, & 5 or more servings of fruits and vegetables daily   Intervention Assist the participant in steps to quit. Provide individualized education and counseling about committing to Tobacco Cessation, relapse prevention, and pharmacological support that  can be provided by physician.;Advice worker, assist with locating and accessing local/national Quit Smoking programs, and support quit date choice.   Expected Outcomes Short Term: Will demonstrate readiness to quit, by selecting a quit date.;Short Term: Will quit all tobacco product use, adhering to prevention of relapse plan.;Long Term: Complete abstinence from all tobacco products for at least 12 months from quit date.   Hypertension Yes   Intervention Provide education on lifestyle modifcations including regular physical activity/exercise, weight management, moderate sodium restriction and increased consumption of fresh fruit, vegetables, and low fat dairy, alcohol moderation, and smoking cessation.;Monitor prescription use compliance.   Expected Outcomes Short  Term: Continued assessment and intervention until BP is < 140/33mm HG in hypertensive participants. < 130/70mm HG in hypertensive participants with diabetes, heart failure or chronic kidney disease.;Long Term: Maintenance of blood pressure at goal levels.   Lipids Yes   Intervention Provide education and support for participant on nutrition & aerobic/resistive exercise along with prescribed medications to achieve LDL 70mg , HDL >40mg .   Expected Outcomes Short Term: Participant states understanding of desired cholesterol values and is compliant with medications prescribed. Participant is following exercise prescription and nutrition guidelines.;Long Term: Cholesterol controlled with medications as prescribed, with individualized exercise RX and with personalized nutrition plan. Value goals: LDL < 70mg , HDL > 40 mg.      Core Components/Risk Factors/Patient Goals Review:    Core Components/Risk Factors/Patient Goals at Discharge (Final Review):    ITP Comments:     ITP Comments    Row Name 10/19/16 1322           ITP Comments Medical review completed. Initial IPT created. Documentation of diagnosis can be found in [facility] 08/10/2016 encounter          Comments:

## 2016-10-19 NOTE — Progress Notes (Signed)
Daily Session Note  Patient Details  Name: Peter [name] MRN: [id number] Date of Birth: 01/26/1941 Referring Provider:     Cardiac Rehab from 10/19/2016 in [facility] Cardiac and Pulmonary Rehab  Referring Provider  [name]      Encounter Date: 10/19/2016  Check In:     Session Check In - 10/19/16 1321      Check-In   Location [facility]-Cardiac & Pulmonary Rehab   Staff Present Heath Lark, RN, BSN, Lance Sell, BA, ACSM CEP, Exercise Physiologist   Supervising physician immediately available to respond to emergencies See telemetry face sheet for immediately available ER MD   Medication changes reported     No   Fall or balance concerns reported    No   Warm-up and Cool-down Performed as group-led instruction   Resistance Training Performed Yes   VAD Patient? No     Pain Assessment   Currently in Pain? No/denies           Exercise Prescription Changes - 10/19/16 1300      Response to Exercise   Blood Pressure (Admit) 148/64   Blood Pressure (Exercise) 140/80   Heart Rate (Admit) 80 bpm   Heart Rate (Exit) 83 bpm   Rating of Perceived Exertion (Exercise) 12      History  Smoking Status  . Current Every Day Smoker  . Packs/day: 0.50  . Years: 0.50  . Types: Cigarettes  Smokeless Tobacco  . Never Used    Goals Met:  Exercise tolerated well Personal goals reviewed No report of cardiac concerns or symptoms Strength training completed today  Goals Unmet:  Not Applicable  Comments: Medical review completed today   Dr. Emily Filbert is Medical Director for [place] and [facility].

## 2016-10-22 DIAGNOSIS — I2510 Atherosclerotic heart disease of native coronary artery without angina pectoris: Secondary | ICD-10-CM | POA: Diagnosis not present

## 2016-10-22 DIAGNOSIS — Z9889 Other specified postprocedural states: Secondary | ICD-10-CM | POA: Diagnosis not present

## 2016-10-22 DIAGNOSIS — I10 Essential (primary) hypertension: Secondary | ICD-10-CM | POA: Diagnosis not present

## 2016-10-22 DIAGNOSIS — E785 Hyperlipidemia, unspecified: Secondary | ICD-10-CM | POA: Diagnosis not present

## 2016-10-22 DIAGNOSIS — R918 Other nonspecific abnormal finding of lung field: Secondary | ICD-10-CM | POA: Diagnosis not present

## 2016-10-22 DIAGNOSIS — Z09 Encounter for follow-up examination after completed treatment for conditions other than malignant neoplasm: Secondary | ICD-10-CM | POA: Diagnosis not present

## 2016-10-22 DIAGNOSIS — T82330A Leakage of aortic (bifurcation) graft (replacement), initial encounter: Secondary | ICD-10-CM | POA: Diagnosis not present

## 2016-10-22 DIAGNOSIS — I716 Thoracoabdominal aortic aneurysm, without rupture: Secondary | ICD-10-CM | POA: Diagnosis not present

## 2016-10-22 DIAGNOSIS — I252 Old myocardial infarction: Secondary | ICD-10-CM | POA: Diagnosis not present

## 2016-10-26 ENCOUNTER — Encounter: Payer: PPO | Admitting: *Deleted

## 2016-10-26 DIAGNOSIS — I5022 Chronic systolic (congestive) heart failure: Secondary | ICD-10-CM

## 2016-10-26 DIAGNOSIS — Z955 Presence of coronary angioplasty implant and graft: Secondary | ICD-10-CM

## 2016-10-26 DIAGNOSIS — I110 Hypertensive heart disease with heart failure: Secondary | ICD-10-CM | POA: Diagnosis not present

## 2016-10-26 NOTE — Progress Notes (Signed)
Daily Session Note  Patient Details  Name: Peter [name] MRN: [id number] Date of Birth: 1940-12-20 Referring Provider:     Cardiac Rehab from 10/19/2016 in [facility] Cardiac and Pulmonary Rehab  Referring Provider  [name]      Encounter Date: 10/26/2016  Check In:     Session Check In - 10/26/16 0746      Check-In   Location [facility]-Cardiac & Pulmonary Rehab   Staff Present Gerlene Burdock, RN, Levie Heritage, MA, ACSM RCEP, Exercise Physiologist;Kelly Amedeo Plenty, BS, ACSM CEP, Exercise Physiologist   Supervising physician immediately available to respond to emergencies See telemetry face sheet for immediately available ER MD   Medication changes reported     No   Fall or balance concerns reported    No   Tobacco Cessation No Change   Warm-up and Cool-down Performed on first and last piece of equipment   Resistance Training Performed Yes   VAD Patient? No     Pain Assessment   Currently in Pain? No/denies   Multiple Pain Sites No         History  Smoking Status  . Current Every Day Smoker  . Packs/day: 0.50  . Years: 0.50  . Types: Cigarettes  Smokeless Tobacco  . Never Used    Goals Met:  Exercise tolerated well Personal goals reviewed No report of cardiac concerns or symptoms Strength training completed today  Goals Unmet:  Not Applicable  Comments: First full day of exercise!  Patient was oriented to gym and equipment including functions, settings, policies, and procedures.  Patient's individual exercise prescription and treatment plan were reviewed.  All starting workloads were established based on the results of the 6 minute walk test done at initial orientation visit.  The plan for exercise progression was also introduced and progression will be customized based on patient's performance and goals.    Dr. Emily Filbert is Medical Director for [place] and [facility].

## 2016-10-28 ENCOUNTER — Encounter: Payer: Self-pay | Admitting: *Deleted

## 2016-10-28 DIAGNOSIS — Z955 Presence of coronary angioplasty implant and graft: Secondary | ICD-10-CM

## 2016-10-28 DIAGNOSIS — I5022 Chronic systolic (congestive) heart failure: Secondary | ICD-10-CM

## 2016-10-28 DIAGNOSIS — I110 Hypertensive heart disease with heart failure: Secondary | ICD-10-CM | POA: Diagnosis not present

## 2016-10-28 NOTE — Progress Notes (Signed)
Cardiac Individual Treatment Plan  Patient Details  Name: Peter [name] MRN: [id number] Date of Birth: 02-08-1941 Referring Provider:     Cardiac Rehab from 10/19/2016 in [facility] Cardiac and Pulmonary Rehab  Referring Provider  [name]      Initial Encounter Date:    Cardiac Rehab from 10/19/2016 in [facility] Cardiac and Pulmonary Rehab  Date  10/19/16  Referring Provider  [name]      Visit Diagnosis: Heart failure, chronic systolic (HCC)  Status post coronary artery stent placement  Patient's Home Medications on Admission:  Current Outpatient Prescriptions:  .  amLODipine-benazepril (LOTREL) 10-40 MG capsule, TAKE 1 CAPSULE BY MOUTH EVERY DAY, Disp: , Rfl:  .  aspirin 325 MG tablet, Take 325 mg by mouth 2 (two) times daily. Patient takes if there is any pain or burning in the chest., Disp: , Rfl:  .  atorvastatin (LIPITOR) 40 MG tablet, Take 1 tablet (40 mg total) by mouth daily at 6 PM., Disp: 30 tablet, Rfl: 0 .  carvedilol (COREG) 3.125 MG tablet, Take 3.125 mg by mouth 2 (two) times daily with a meal., Disp: , Rfl:  .  cetirizine (WAL-ZYR) 10 MG tablet, Take 10 mg by mouth., Disp: , Rfl:  .  clobetasol cream (TEMOVATE) 0.05 %, APPLY TO PSORIASIS BID PRN UNTIL SMOOTH, Disp: , Rfl: 1 .  clopidogrel (PLAVIX) 75 MG tablet, TAKE 1 TABLET(75 MG) BY MOUTH EVERY DAY, Disp: 30 tablet, Rfl: 1 .  cyanocobalamin 1000 MCG tablet, Take 1,000 mcg by mouth daily. , Disp: , Rfl:  .  furosemide (LASIX) 20 MG tablet, Take 1 tablet (20 mg total) by mouth daily. (Patient taking differently: Take 20 mg by mouth 2 (two) times daily. ), Disp: 30 tablet, Rfl: 1 .  GLUCOSAMINE-CHONDROITIN-VIT C PO, Take 1 tablet by mouth daily., Disp: , Rfl:  .  hydrochlorothiazide (HYDRODIURIL) 12.5 MG tablet, Take 12.5 mg by mouth daily. , Disp: , Rfl:  .  isosorbide mononitrate (IMDUR) 30 MG 24 hr tablet, Take 30 mg by mouth daily., Disp: , Rfl:  .  omeprazole (PRILOSEC) 20 MG capsule, Take by mouth., Disp: , Rfl:  .   potassium chloride SA (K-DUR,KLOR-CON) 20 MEQ tablet, TAKE 2 TABLETS BY MOUTH TWICE DAILY, Disp: , Rfl:  .  spironolactone (ALDACTONE) 25 MG tablet, Take 0.5 tablets by mouth daily., Disp: , Rfl:  .  tamsulosin (FLOMAX) 0.4 MG CAPS capsule, TAKE 1 CAPSULE BY MOUTH EVERY DAY, Disp: , Rfl:   Past Medical History: Past Medical History:  Diagnosis Date  . Anginal pain ([place])   . Asthma   . Bladder infection, acute 03/2011   "had a whole lot of bleeding from this"  . Coronary artery disease   . High cholesterol   . Hypertension   . 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 Myocardial infarction ([place]) 1996  . NSTEMI (non-ST elevated myocardial infarction) ([place]) 12/14/2011  . Shortness of breath 12/14/2011   "@ rest, lying down, w/exertion"    Tobacco Use: History  Smoking Status  . Current Every Day Smoker  . Packs/day: 0.50  . Years: 0.50  . Types: Cigarettes  Smokeless Tobacco  . Never Used    Labs: Recent Review Flowsheet Data    Labs for ITP Cardiac and Pulmonary Rehab Latest Ref Rng & Units 12/16/2011 08/08/2016   Cholestrol 0 - 200 mg/dL 141 108   LDLCALC 0 - 99 mg/dL 83 50  HDL >40 mg/dL 38(L) 36(L)   Trlycerides <150 mg/dL 102 111       Exercise Target Goals:    Exercise Program Goal: Individual exercise prescription set with THRR, safety & activity barriers. Participant demonstrates ability to understand and report RPE using BORG scale, to self-measure pulse accurately, and to acknowledge the importance of the exercise prescription.  Exercise Prescription Goal: Starting with aerobic activity 30 plus minutes a day, 3 days per week for initial exercise prescription. Provide home exercise prescription and guidelines that participant acknowledges understanding prior to discharge.  Activity Barriers & Risk Stratification:   6 Minute Walk:     6 Minute Walk    Row Name 10/19/16 1406         6 Minute Walk   Phase  Initial     Distance 1200 feet     Walk Time 6 minutes     # of Rest Breaks 0     MPH 2.27     METS 2.29     RPE 12     VO2 Peak 8.02     Symptoms Yes (comment)     Comments hip pain 5/10     Resting HR 87 bpm     Resting BP 148/64     Max Ex. HR 91 bpm     Max Ex. BP 140/80        Oxygen Initial Assessment:   Oxygen Re-Evaluation:   Oxygen Discharge (Final Oxygen Re-Evaluation):   Initial Exercise Prescription:     Initial Exercise Prescription - 10/19/16 1400      Date of Initial Exercise RX and Referring Provider   Date 10/19/16   Referring Provider [name]     Treadmill   MPH 1.7  3/3/3   Grade 0   Minutes 15   METs 2.3     Recumbant Bike   Level 1   RPM 60   Minutes 15   METs 2     REL-XR   Level 1   Speed 60   Minutes 15   METs 2.3     T5 Nustep   Level 1   SPM 80   Minutes 15   METs 2     Prescription Details   Frequency (times per week) 3   Duration Progress to 30 minutes of continuous aerobic without signs/symptoms of physical distress     Intensity   THRR 40-80% of Max Heartrate 109-132   Ratings of Perceived Exertion 11-13   Perceived Dyspnea 0-4     Resistance Training   Training Prescription Yes   Weight 3   Reps 10-15      Perform Capillary Blood Glucose checks as needed.  Exercise Prescription Changes:     Exercise Prescription Changes    Row Name 10/19/16 1300             Response to Exercise   Blood Pressure (Admit) 148/64       Blood Pressure (Exercise) 140/80       Heart Rate (Admit) 80 bpm       Heart Rate (Exit) 83 bpm       Rating of Perceived Exertion (Exercise) 12          Exercise Comments:     Exercise Comments    Row Name 10/26/16 [phone]           Exercise Comments First full day of exercise!  Patient was oriented to gym and equipment including functions, settings, policies, and procedures.  Patient's individual exercise prescription and treatment plan were reviewed.  All starting  workloads were established based on the results of the 6 minute walk test done at initial orientation visit.  The plan for exercise progression was also introduced and progression will be customized based on patient's performance and goals.          Exercise Goals and Review:     Exercise Goals    Row Name 10/19/16 1408             Exercise Goals   Increase Physical Activity Yes       Intervention Provide advice, education, support and counseling about physical activity/exercise needs.;Develop an individualized exercise prescription for aerobic and resistive training based on initial evaluation findings, risk stratification, comorbidities and participant's personal goals.       Expected Outcomes Achievement of increased cardiorespiratory fitness and enhanced flexibility, muscular endurance and strength shown through measurements of functional capacity and personal statement of participant.       Increase Strength and Stamina Yes       Intervention Provide advice, education, support and counseling about physical activity/exercise needs.;Develop an individualized exercise prescription for aerobic and resistive training based on initial evaluation findings, risk stratification, comorbidities and participant's personal goals.       Expected Outcomes Achievement of increased cardiorespiratory fitness and enhanced flexibility, muscular endurance and strength shown through measurements of functional capacity and personal statement of participant.          Exercise Goals Re-Evaluation :   Discharge Exercise Prescription (Final Exercise Prescription Changes):     Exercise Prescription Changes - 10/19/16 1300      Response to Exercise   Blood Pressure (Admit) 148/64   Blood Pressure (Exercise) 140/80   Heart Rate (Admit) 80 bpm   Heart Rate (Exit) 83 bpm   Rating of Perceived Exertion (Exercise) 12      Nutrition:  Target Goals: Understanding of nutrition guidelines, daily intake of  sodium 1500mg , cholesterol 200mg , calories 30% from fat and 7% or less from saturated fats, daily to have 5 or more servings of fruits and vegetables.  Biometrics:     Pre Biometrics - 10/19/16 1405      Pre Biometrics   Height 5' 10.75" (1.797 m)   Weight 210 lb 14.4 oz (95.7 kg)   BMI (Calculated) 29.7       Nutrition Therapy Plan and Nutrition Goals:     Nutrition Therapy & Goals - 10/19/16 1339      Intervention Plan   Intervention Prescribe, educate and counsel regarding individualized specific dietary modifications aiming towards targeted core components such as weight, hypertension, lipid management, diabetes, heart failure and other comorbidities.   Expected Outcomes Short Term Goal: Understand basic principles of dietary content, such as calories, fat, sodium, cholesterol and nutrients.;Short Term Goal: A plan has been developed with personal nutrition goals set during dietitian appointment.;Long Term Goal: Adherence to prescribed nutrition plan.      Nutrition Discharge: Rate Your Plate Scores:     Nutrition Assessments - 10/19/16 1339      MEDFICTS Scores   Pre Score 61      Nutrition Goals Re-Evaluation:   Nutrition Goals Discharge (Final Nutrition Goals Re-Evaluation):   Psychosocial: Target Goals: Acknowledge presence or absence of significant depression and/or stress, maximize coping skills, provide positive support system. Participant is able to verbalize types and ability to use techniques and skills needed for reducing stress and depression.   Initial Review & Psychosocial Screening:  Initial Psych Review & Screening - 10/19/16 1344      Initial Review   Current issues with None Identified     Family Dynamics   Good Support System? Yes  Good friend Peter [name]     Barriers   Psychosocial barriers to participate in program There are no identifiable barriers or psychosocial needs.;The patient should benefit from training in stress  management and relaxation.     Screening Interventions   Interventions Encouraged to exercise;Provide feedback about the scores to participant;To provide support and resources with identified psychosocial needs      Quality of Life Scores:      Quality of Life - 10/19/16 1344      Quality of Life Scores   Health/Function Pre 22.87 %   Socioeconomic Pre 23.21 %   Psych/Spiritual Pre 22.5 %   Family Pre 26.5 %   GLOBAL Pre 23.4 %      PHQ-9: Recent Review Flowsheet Data    Depression screen [facility] 2/9 10/19/2016 09/15/2016 08/18/2016   Decreased Interest 0 0 0   Down, Depressed, Hopeless 0 0 0   PHQ - 2 Score 0 0 0   Altered sleeping 0 - -   Tired, decreased energy 1 - -   Change in appetite 0 - -   Feeling bad or failure about yourself  0 - -   Trouble concentrating 0 - -   Moving slowly or fidgety/restless 0 - -   Suicidal thoughts 0 - -   PHQ-9 Score 1 - -   Difficult doing work/chores Not difficult at all - -     Interpretation of Total Score  Total Score Depression Severity:  1-4 = Minimal depression, 5-9 = Mild depression, 10-14 = Moderate depression, 15-19 = Moderately severe depression, 20-27 = Severe depression   Psychosocial Evaluation and Intervention:   Psychosocial Re-Evaluation:   Psychosocial Discharge (Final Psychosocial Re-Evaluation):   Vocational Rehabilitation: Provide vocational rehab assistance to qualifying candidates.   Vocational Rehab Evaluation & Intervention:   Education: Education Goals: Education classes will be provided on a weekly basis, covering required topics. Participant will state understanding/return demonstration of topics presented.  Learning Barriers/Preferences:     Learning Barriers/Preferences - 10/19/16 1347      Learning Barriers/Preferences   Learning Barriers None   Learning Preferences None      Education Topics: General Nutrition Guidelines/Fats and Fiber: -Group instruction provided by verbal, written  material, models and posters to present the general guidelines for heart healthy nutrition. Gives an explanation and review of dietary fats and fiber.   Controlling Sodium/Reading Food Labels: -Group verbal and written material supporting the discussion of sodium use in heart healthy nutrition. Review and explanation with models, verbal and written materials for utilization of the food label.   Exercise Physiology & Risk Factors: - Group verbal and written instruction with models to review the exercise physiology of the cardiovascular system and associated critical values. Details cardiovascular disease risk factors and the goals associated with each risk factor.   Aerobic Exercise & Resistance Training: - Gives group verbal and written discussion on the health impact of inactivity. On the components of aerobic and resistive training programs and the benefits of this training and how to safely progress through these programs.   Flexibility, Balance, General Exercise Guidelines: - Provides group verbal and written instruction on the benefits of flexibility and balance training programs. Provides general exercise guidelines with specific guidelines to those with heart or lung disease. Demonstration  and skill practice provided.   Cardiac Rehab from 10/26/2016 in [facility] Cardiac and Pulmonary Rehab  Date  10/26/16  Educator  [facility]  Instruction Review Code  2- meets goals/outcomes      Stress Management: - Provides group verbal and written instruction about the health risks of elevated stress, cause of high stress, and healthy ways to reduce stress.   Depression: - Provides group verbal and written instruction on the correlation between heart/lung disease and depressed mood, treatment options, and the stigmas associated with seeking treatment.   Anatomy & Physiology of the Heart: - Group verbal and written instruction and models provide basic cardiac anatomy and physiology, with the coronary  electrical and arterial systems. Review of: AMI, Angina, Valve disease, Heart Failure, Cardiac Arrhythmia, Pacemakers, and the ICD.   Cardiac Procedures: - Group verbal and written instruction and models to describe the testing methods done to diagnose heart disease. Reviews the outcomes of the test results. Describes the treatment choices: Medical Management, Angioplasty, or Coronary Bypass Surgery.   Cardiac Medications: - Group verbal and written instruction to review commonly prescribed medications for heart disease. Reviews the medication, class of the drug, and side effects. Includes the steps to properly store meds and maintain the prescription regimen.   Go Sex-Intimacy & Heart Disease, Get SMART - Goal Setting: - Group verbal and written instruction through game format to discuss heart disease and the return to sexual intimacy. Provides group verbal and written material to discuss and apply goal setting through the application of the S.M.A.R.T. Method.   Other Matters of the Heart: - Provides group verbal, written materials and models to describe Heart Failure, Angina, Valve Disease, and Diabetes in the realm of heart disease. Includes description of the disease process and treatment options available to the cardiac patient.   Exercise & Equipment Safety: - Individual verbal instruction and demonstration of equipment use and safety with use of the equipment.   Cardiac Rehab from 10/26/2016 in [facility] Cardiac and Pulmonary Rehab  Date  10/19/16  Educator  Sb  Instruction Review Code  2- meets goals/outcomes      Infection Prevention: - Provides verbal and written material to individual with discussion of infection control including proper hand washing and proper equipment cleaning during exercise session.   Cardiac Rehab from 10/26/2016 in [facility] Cardiac and Pulmonary Rehab  Date  10/19/16  Educator  Sb  Instruction Review Code  2- meets goals/outcomes      Falls Prevention: -  Provides verbal and written material to individual with discussion of falls prevention and safety.   Cardiac Rehab from 10/26/2016 in [facility] Cardiac and Pulmonary Rehab  Date  10/19/16  Educator  SB  Instruction Review Code  2- meets goals/outcomes      Diabetes: - Individual verbal and written instruction to review signs/symptoms of diabetes, desired ranges of glucose level fasting, after meals and with exercise. Advice that pre and post exercise glucose checks will be done for 3 sessions at entry of program.    Knowledge Questionnaire Score:     Knowledge Questionnaire Score - 10/19/16 1347      Knowledge Questionnaire Score   Pre Score 23/28  Correct responses reviewed with Peter [name] today. He verbalized understanding of responses and had no questions today.      Core Components/Risk Factors/Patient Goals at Admission:     Personal Goals and Risk Factors at Admission - 10/19/16 1524      Core Components/Risk Factors/Patient Goals on Admission  Weight Management Yes;Weight Maintenance   Admit Weight 210 lb 14.4 oz (95.7 kg)   Goal Weight: Short Term 210 lb (95.3 kg)   Goal Weight: Long Term 210 lb (95.3 kg)   Expected Outcomes Short Term: Continue to assess and modify interventions until short term weight is achieved;Long Term: Adherence to nutrition and physical activity/exercise program aimed toward attainment of established weight goal;Weight Maintenance: Understanding of the daily nutrition guidelines, which includes 25-35% calories from fat, 7% or less cal from saturated fats, less than 200mg  cholesterol, less than 1.5gm of sodium, & 5 or more servings of fruits and vegetables daily   Intervention Assist the participant in steps to quit. Provide individualized education and counseling about committing to Tobacco Cessation, relapse prevention, and pharmacological support that can be provided by physician.;Advice worker, assist with locating and accessing  local/national Quit Smoking programs, and support quit date choice.   Expected Outcomes Short Term: Will demonstrate readiness to quit, by selecting a quit date.;Short Term: Will quit all tobacco product use, adhering to prevention of relapse plan.;Long Term: Complete abstinence from all tobacco products for at least 12 months from quit date.   Hypertension Yes   Intervention Provide education on lifestyle modifcations including regular physical activity/exercise, weight management, moderate sodium restriction and increased consumption of fresh fruit, vegetables, and low fat dairy, alcohol moderation, and smoking cessation.;Monitor prescription use compliance.   Expected Outcomes Short Term: Continued assessment and intervention until BP is < 140/74mm HG in hypertensive participants. < 130/40mm HG in hypertensive participants with diabetes, heart failure or chronic kidney disease.;Long Term: Maintenance of blood pressure at goal levels.   Lipids Yes   Intervention Provide education and support for participant on nutrition & aerobic/resistive exercise along with prescribed medications to achieve LDL 70mg , HDL >40mg .   Expected Outcomes Short Term: Participant states understanding of desired cholesterol values and is compliant with medications prescribed. Participant is following exercise prescription and nutrition guidelines.;Long Term: Cholesterol controlled with medications as prescribed, with individualized exercise RX and with personalized nutrition plan. Value goals: LDL < 70mg , HDL > 40 mg.      Core Components/Risk Factors/Patient Goals Review:    Core Components/Risk Factors/Patient Goals at Discharge (Final Review):    ITP Comments:     ITP Comments    Row Name 10/19/16 1322 10/28/16 0636         ITP Comments Medical review completed. Initial IPT created. Documentation of diagnosis can be found in [facility] 08/10/2016 encounter 30 day review. Continue with ITP unless directed changes per  Medical Director review     New start          Comments:

## 2016-10-28 NOTE — Progress Notes (Signed)
Daily Session Note  Patient Details  Name: MUHAMAD SERANO MRN: [id number] Date of Birth: 17-Jun-1940 Referring Provider:     Cardiac Rehab from 10/19/2016 in [facility] Cardiac and Pulmonary Rehab  Referring Provider  [name]      Encounter Date: 10/28/2016  Check In:     Session Check In - 10/28/16 0800      Check-In   Location [facility]-Cardiac & Pulmonary Rehab   Staff Present Alberteen Sam, MA, ACSM RCEP, Exercise Physiologist;[place] Roker Christain [place], RN BSN   Supervising physician immediately available to respond to emergencies See telemetry face sheet for immediately available ER MD   Medication changes reported     No   Fall or balance concerns reported    No   Tobacco Cessation No Change   Warm-up and Cool-down Performed on first and last piece of equipment   Resistance Training Performed Yes   VAD Patient? No     Pain Assessment   Currently in Pain? No/denies   Multiple Pain Sites No         History  Smoking Status  . Current Every Day Smoker  . Packs/day: 0.50  . Years: 0.50  . Types: Cigarettes  Smokeless Tobacco  . Never Used    Goals Met:  Independence with exercise equipment Exercise tolerated well No report of cardiac concerns or symptoms Strength training completed today  Goals Unmet:  Not Applicable  Comments: Pt able to follow exercise prescription today without complaint.  Will continue to monitor for progression.   Dr. Emily Filbert is Medical Director for [facility] and [facility].

## 2016-10-30 DIAGNOSIS — I5022 Chronic systolic (congestive) heart failure: Secondary | ICD-10-CM

## 2016-10-30 DIAGNOSIS — I110 Hypertensive heart disease with heart failure: Secondary | ICD-10-CM | POA: Diagnosis not present

## 2016-10-30 DIAGNOSIS — Z955 Presence of coronary angioplasty implant and graft: Secondary | ICD-10-CM

## 2016-10-30 NOTE — Progress Notes (Signed)
Daily Session Note  Patient Details  Name: Deacon R Ponder MRN: [id number] Date of Birth: 01/02/1941 Referring Provider:     Cardiac Rehab from 10/19/2016 in [facility] Cardiac and Pulmonary Rehab  Referring Provider  [name]      Encounter Date: 10/30/2016  Check In:     Session Check In - 10/30/16 0901      Check-In   Location [facility]-Cardiac & Pulmonary Rehab   Staff Present Jessica Hawkins, MA, ACSM RCEP, Exercise Physiologist;Amanda Sommer, BA, ACSM CEP, Exercise Physiologist;Diane Wright RN,BSN   Supervising physician immediately available to respond to emergencies See telemetry face sheet for immediately available ER MD   Medication changes reported     No   Fall or balance concerns reported    No   Warm-up and Cool-down Performed on first and last piece of equipment   Resistance Training Performed Yes   VAD Patient? No     Pain Assessment   Currently in Pain? No/denies           Exercise Prescription Changes - 10/30/16 0900      Home Exercise Plan   Plans to continue exercise at [facility] [employer]   Frequency Add 1 additional day to program exercise sessions.   Initial Home Exercises Provided 10/30/16      History  Smoking Status  . Current Every Day Smoker  . Packs/day: 0.50  . Years: 0.50  . Types: Cigarettes  Smokeless Tobacco  . Never Used    Goals Met:  Independence with exercise equipment Exercise tolerated well No report of cardiac concerns or symptoms Strength training completed today  Goals Unmet:  Not Applicable  Comments: Reviewed home exercise with pt today.  Pt plans to walk and attend [facility] [employer] for exercise.  Reviewed THR, pulse, RPE, sign and symptoms, NTG use, and when to call 911 or MD.  Also discussed weather considerations and indoor options.  Pt voiced understanding.    Dr. Mark Miller is Medical Director for [facility] and [facility]. 

## 2016-11-02 ENCOUNTER — Encounter: Payer: PPO | Admitting: *Deleted

## 2016-11-02 DIAGNOSIS — Z955 Presence of coronary angioplasty implant and graft: Secondary | ICD-10-CM

## 2016-11-02 DIAGNOSIS — I5022 Chronic systolic (congestive) heart failure: Secondary | ICD-10-CM

## 2016-11-02 DIAGNOSIS — I110 Hypertensive heart disease with heart failure: Secondary | ICD-10-CM | POA: Diagnosis not present

## 2016-11-02 NOTE — Progress Notes (Signed)
Daily Session Note  Patient Details  Name: Peter [name] MRN: [id number] Date of Birth: 1941-04-07 Referring Provider:     Cardiac Rehab from 10/19/2016 in [facility] Cardiac and Pulmonary Rehab  Referring Provider  [name]      Encounter Date: 11/02/2016  Check In:     Session Check In - 11/02/16 0744      Check-In   Location [facility]-Cardiac & Pulmonary Rehab   Staff Present Gerlene Burdock, RN, Levie Heritage, MA, ACSM RCEP, Exercise Physiologist;Evagelia Knack Amedeo Plenty, BS, ACSM CEP, Exercise Physiologist   Supervising physician immediately available to respond to emergencies See telemetry face sheet for immediately available ER MD   Medication changes reported     No   Fall or balance concerns reported    No   Tobacco Cessation No Change   Warm-up and Cool-down Performed on first and last piece of equipment   Resistance Training Performed Yes   VAD Patient? No     Pain Assessment   Currently in Pain? No/denies   Multiple Pain Sites No         History  Smoking Status  . Current Every Day Smoker  . Packs/day: 0.50  . Years: 0.50  . Types: Cigarettes  Smokeless Tobacco  . Never Used    Goals Met:  Independence with exercise equipment Exercise tolerated well No report of cardiac concerns or symptoms Strength training completed today  Goals Unmet:  Not Applicable  Comments: Pt able to follow exercise prescription today without complaint.  Will continue to monitor for progression.    Dr. Emily Filbert is Medical Director for [place] and [facility].

## 2016-11-04 DIAGNOSIS — Z955 Presence of coronary angioplasty implant and graft: Secondary | ICD-10-CM

## 2016-11-04 DIAGNOSIS — I5022 Chronic systolic (congestive) heart failure: Secondary | ICD-10-CM

## 2016-11-04 DIAGNOSIS — I110 Hypertensive heart disease with heart failure: Secondary | ICD-10-CM | POA: Diagnosis not present

## 2016-11-04 NOTE — Progress Notes (Signed)
Daily Session Note  Patient Details  Name: Peter [name] MRN: [id number] Date of Birth: 10/19/1940 Referring Provider:     Cardiac Rehab from 10/19/2016 in [facility] Cardiac and Pulmonary Rehab  Referring Provider  [name]      Encounter Date: 11/04/2016  Check In:     Session Check In - 11/04/16 0807      Check-In   Location [facility]-Cardiac & Pulmonary Rehab   Staff Present Heath Lark, RN, BSN, CCRP;Jessica [place], MA, ACSM RCEP, Exercise Physiologist;Joseph Flavia Shipper   Supervising physician immediately available to respond to emergencies See telemetry face sheet for immediately available ER MD   Medication changes reported     No   Fall or balance concerns reported    No   Tobacco Cessation Use Decreased  1 pack (20 cigs) to half pack   Warm-up and Cool-down Performed on first and last piece of equipment   Resistance Training Performed Yes   VAD Patient? No     Pain Assessment   Currently in Pain? No/denies   Multiple Pain Sites No         History  Smoking Status  . Current Every Day Smoker  . Packs/day: 0.50  . Years: 0.50  . Types: Cigarettes  Smokeless Tobacco  . Never Used    Goals Met:  Independence with exercise equipment Exercise tolerated well No report of cardiac concerns or symptoms Strength training completed today  Goals Unmet:  Not Applicable  Comments: Pt able to follow exercise prescription today without complaint.  Will continue to monitor for progression.   Dr. Emily Filbert is Medical Director for [place] and [facility].

## 2016-11-09 ENCOUNTER — Encounter: Payer: Self-pay | Admitting: *Deleted

## 2016-11-09 ENCOUNTER — Telehealth: Payer: Self-pay | Admitting: *Deleted

## 2016-11-09 DIAGNOSIS — Z955 Presence of coronary angioplasty implant and graft: Secondary | ICD-10-CM

## 2016-11-09 DIAGNOSIS — I5022 Chronic systolic (congestive) heart failure: Secondary | ICD-10-CM

## 2016-11-09 NOTE — Telephone Encounter (Signed)
Peter [name] came in this morning for class.  He said he was having a hard time sitting down and was very achy.  Over the weekend, he fell off the bottom step of a ladder.  We encouraged him to rest and seek medical care if symptoms get worse.

## 2016-11-10 DIAGNOSIS — H353232 Exudative age-related macular degeneration, bilateral, with inactive choroidal neovascularization: Secondary | ICD-10-CM | POA: Diagnosis not present

## 2016-11-11 ENCOUNTER — Encounter: Payer: PPO | Attending: Cardiology

## 2016-11-11 DIAGNOSIS — Z7982 Long term (current) use of aspirin: Secondary | ICD-10-CM | POA: Insufficient documentation

## 2016-11-11 DIAGNOSIS — F17210 Nicotine dependence, cigarettes, uncomplicated: Secondary | ICD-10-CM | POA: Insufficient documentation

## 2016-11-11 DIAGNOSIS — I2510 Atherosclerotic heart disease of native coronary artery without angina pectoris: Secondary | ICD-10-CM | POA: Insufficient documentation

## 2016-11-11 DIAGNOSIS — I5022 Chronic systolic (congestive) heart failure: Secondary | ICD-10-CM | POA: Insufficient documentation

## 2016-11-11 DIAGNOSIS — H3530 Unspecified macular degeneration: Secondary | ICD-10-CM | POA: Insufficient documentation

## 2016-11-11 DIAGNOSIS — Z79899 Other long term (current) drug therapy: Secondary | ICD-10-CM | POA: Insufficient documentation

## 2016-11-11 DIAGNOSIS — Z7902 Long term (current) use of antithrombotics/antiplatelets: Secondary | ICD-10-CM | POA: Insufficient documentation

## 2016-11-11 DIAGNOSIS — I110 Hypertensive heart disease with heart failure: Secondary | ICD-10-CM | POA: Insufficient documentation

## 2016-11-11 DIAGNOSIS — Z955 Presence of coronary angioplasty implant and graft: Secondary | ICD-10-CM | POA: Insufficient documentation

## 2016-11-11 DIAGNOSIS — I252 Old myocardial infarction: Secondary | ICD-10-CM | POA: Insufficient documentation

## 2016-11-11 DIAGNOSIS — J45909 Unspecified asthma, uncomplicated: Secondary | ICD-10-CM | POA: Insufficient documentation

## 2016-11-11 DIAGNOSIS — E7800 Pure hypercholesterolemia, unspecified: Secondary | ICD-10-CM | POA: Insufficient documentation

## 2016-11-18 ENCOUNTER — Encounter: Payer: PPO | Admitting: *Deleted

## 2016-11-20 ENCOUNTER — Telehealth: Payer: Self-pay | Admitting: *Deleted

## 2016-11-20 ENCOUNTER — Encounter: Payer: Self-pay | Admitting: *Deleted

## 2016-11-20 DIAGNOSIS — I5022 Chronic systolic (congestive) heart failure: Secondary | ICD-10-CM

## 2016-11-20 DIAGNOSIS — Z955 Presence of coronary angioplasty implant and graft: Secondary | ICD-10-CM

## 2016-11-20 NOTE — Telephone Encounter (Signed)
Called to check on status of return.  Check he had fallen off ladder.  The pain is moving down leg.  He is afraid it could be a blood clot.  I encouraged him to go see a doctor.  He hopes to try to return next Wednesday.

## 2016-11-23 ENCOUNTER — Encounter: Payer: PPO | Admitting: *Deleted

## 2016-11-23 ENCOUNTER — Encounter: Payer: Self-pay | Admitting: *Deleted

## 2016-11-23 DIAGNOSIS — Z7982 Long term (current) use of aspirin: Secondary | ICD-10-CM | POA: Diagnosis not present

## 2016-11-23 DIAGNOSIS — I252 Old myocardial infarction: Secondary | ICD-10-CM | POA: Diagnosis not present

## 2016-11-23 DIAGNOSIS — I5022 Chronic systolic (congestive) heart failure: Secondary | ICD-10-CM

## 2016-11-23 DIAGNOSIS — Z79899 Other long term (current) drug therapy: Secondary | ICD-10-CM | POA: Diagnosis not present

## 2016-11-23 DIAGNOSIS — I110 Hypertensive heart disease with heart failure: Secondary | ICD-10-CM | POA: Diagnosis not present

## 2016-11-23 DIAGNOSIS — I2510 Atherosclerotic heart disease of native coronary artery without angina pectoris: Secondary | ICD-10-CM | POA: Diagnosis not present

## 2016-11-23 DIAGNOSIS — F17210 Nicotine dependence, cigarettes, uncomplicated: Secondary | ICD-10-CM | POA: Diagnosis not present

## 2016-11-23 DIAGNOSIS — J45909 Unspecified asthma, uncomplicated: Secondary | ICD-10-CM | POA: Diagnosis not present

## 2016-11-23 DIAGNOSIS — Z7902 Long term (current) use of antithrombotics/antiplatelets: Secondary | ICD-10-CM | POA: Diagnosis not present

## 2016-11-23 DIAGNOSIS — H3530 Unspecified macular degeneration: Secondary | ICD-10-CM | POA: Diagnosis not present

## 2016-11-23 DIAGNOSIS — Z955 Presence of coronary angioplasty implant and graft: Secondary | ICD-10-CM

## 2016-11-23 DIAGNOSIS — E7800 Pure hypercholesterolemia, unspecified: Secondary | ICD-10-CM | POA: Diagnosis not present

## 2016-11-23 NOTE — Progress Notes (Signed)
Daily Session Note  Patient Details  Name: Peter [name] MRN: [id number] Date of Birth: 01/14/1941 Referring Provider:     Cardiac Rehab from 10/19/2016 in [facility] Cardiac and Pulmonary Rehab  Referring Provider  [name]      Encounter Date: 11/23/2016  Check In:     Session Check In - 11/23/16 0800      Check-In   Location [facility]-Cardiac & Pulmonary Rehab   Staff Present Alberteen Sam, MA, ACSM RCEP, Exercise Physiologist;Carroll Enterkin, RN, Moises Blood, BS, ACSM CEP, Exercise Physiologist   Supervising physician immediately available to respond to emergencies See telemetry face sheet for immediately available ER MD   Medication changes reported     No   Fall or balance concerns reported    Yes   Comments feel off bottom of ladder two weeks ago, still sore in leg.   Tobacco Cessation No Change   Warm-up and Cool-down Performed on first and last piece of equipment   Resistance Training Performed Yes   VAD Patient? No     Pain Assessment   Currently in Pain? No/denies   Multiple Pain Sites No         History  Smoking Status  . Current Every Day Smoker  . Packs/day: 0.50  . Years: 0.50  . Types: Cigarettes  Smokeless Tobacco  . Never Used    Comment: 11/23/16 Still not ready to quit smoking.    Goals Met:  Independence with exercise equipment Exercise tolerated well Personal goals reviewed No report of cardiac concerns or symptoms Strength training completed today  Goals Unmet:  Not Applicable  Comments: Pt able to follow exercise prescription today without complaint.  Will continue to monitor for progression.    Dr. Emily Filbert is Medical Director for [place] and [facility].

## 2016-11-25 ENCOUNTER — Encounter: Payer: Self-pay | Admitting: *Deleted

## 2016-11-25 DIAGNOSIS — Z955 Presence of coronary angioplasty implant and graft: Secondary | ICD-10-CM

## 2016-11-25 DIAGNOSIS — I5022 Chronic systolic (congestive) heart failure: Secondary | ICD-10-CM

## 2016-11-25 DIAGNOSIS — I110 Hypertensive heart disease with heart failure: Secondary | ICD-10-CM | POA: Diagnosis not present

## 2016-11-25 NOTE — Progress Notes (Signed)
Daily Session Note  Patient Details  Name: CLAIR ALFIERI MRN: [id number] Date of Birth: [birth year] Referring Provider:     Cardiac Rehab from 10/19/2016 in [facility] Cardiac and Pulmonary Rehab  Referring Provider  [name]      Encounter Date: 11/25/2016  Check In:     Session Check In - 11/25/16 0741      Check-In   Location [facility]-Cardiac & Pulmonary Rehab   Staff Present Heath Lark, RN, BSN, CCRP;Jessica [place], MA, ACSM RCEP, Exercise Physiologist;Grettel Rames Flavia Shipper   Supervising physician immediately available to respond to emergencies See telemetry face sheet for immediately available ER MD   Medication changes reported     No   Fall or balance concerns reported    No   Warm-up and Cool-down Performed on first and last piece of equipment   Resistance Training Performed Yes   VAD Patient? No     Pain Assessment   Currently in Pain? No/denies   Multiple Pain Sites No         History  Smoking Status  . Current Every Day Smoker  . Packs/day: 0.50  . Years: 0.50  . Types: Cigarettes  Smokeless Tobacco  . Never Used    Comment: 11/23/16 Still not ready to quit smoking.    Goals Met:  Independence with exercise equipment Exercise tolerated well No report of cardiac concerns or symptoms Strength training completed today  Goals Unmet:  Not Applicable  Comments: Pt able to follow exercise prescription today without complaint.  Will continue to monitor for progression.   Dr. Emily Filbert is Medical Director for [place] and [facility].

## 2016-11-25 NOTE — Progress Notes (Signed)
Cardiac Individual Treatment Plan  Patient Details  Name: Peter [name] MRN: [id number] Date of Birth: [birth year] Referring Provider:     Cardiac Rehab from 10/19/2016 in [facility] Cardiac and Pulmonary Rehab  Referring Provider  [name]      Initial Encounter Date:    Cardiac Rehab from 10/19/2016 in [facility] Cardiac and Pulmonary Rehab  Date  10/19/16  Referring Provider  [name]      Visit Diagnosis: Heart failure, chronic systolic (HCC)  Status post coronary artery stent placement  Patient's Home Medications on Admission:  Current Outpatient Prescriptions:  .  amLODipine-benazepril (LOTREL) 10-40 MG capsule, TAKE 1 CAPSULE BY MOUTH EVERY DAY, Disp: , Rfl:  .  aspirin 325 MG tablet, Take 325 mg by mouth 2 (two) times daily. Patient takes if there is any pain or burning in the chest., Disp: , Rfl:  .  atorvastatin (LIPITOR) 40 MG tablet, Take 1 tablet (40 mg total) by mouth daily at 6 PM., Disp: 30 tablet, Rfl: 0 .  carvedilol (COREG) 3.125 MG tablet, Take 3.125 mg by mouth 2 (two) times daily with a meal., Disp: , Rfl:  .  cetirizine (WAL-ZYR) 10 MG tablet, Take 10 mg by mouth., Disp: , Rfl:  .  clobetasol cream (TEMOVATE) 0.05 %, APPLY TO PSORIASIS BID PRN UNTIL SMOOTH, Disp: , Rfl: 1 .  clopidogrel (PLAVIX) 75 MG tablet, TAKE 1 TABLET(75 MG) BY MOUTH EVERY DAY, Disp: 30 tablet, Rfl: 1 .  cyanocobalamin 1000 MCG tablet, Take 1,000 mcg by mouth daily. , Disp: , Rfl:  .  furosemide (LASIX) 20 MG tablet, Take 1 tablet (20 mg total) by mouth daily. (Patient taking differently: Take 20 mg by mouth 2 (two) times daily. ), Disp: 30 tablet, Rfl: 1 .  GLUCOSAMINE-CHONDROITIN-VIT C PO, Take 1 tablet by mouth daily., Disp: , Rfl:  .  hydrochlorothiazide (HYDRODIURIL) 12.5 MG tablet, Take 12.5 mg by mouth daily. , Disp: , Rfl:  .  isosorbide mononitrate (IMDUR) 30 MG 24 hr tablet, Take 30 mg by mouth daily., Disp: , Rfl:  .  omeprazole (PRILOSEC) 20 MG capsule, Take by mouth., Disp: , Rfl:  .   potassium chloride SA (K-DUR,KLOR-CON) 20 MEQ tablet, TAKE 2 TABLETS BY MOUTH TWICE DAILY, Disp: , Rfl:  .  spironolactone (ALDACTONE) 25 MG tablet, Take 0.5 tablets by mouth daily., Disp: , Rfl:  .  tamsulosin (FLOMAX) 0.4 MG CAPS capsule, TAKE 1 CAPSULE BY MOUTH EVERY DAY, Disp: , Rfl:   Past Medical History: Past Medical History:  Diagnosis Date  . Anginal pain ([place])   . Asthma   . Bladder infection, acute 03/2011   "had a whole lot of bleeding from this"  . Coronary artery disease   . High cholesterol   . Hypertension   . 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 Myocardial infarction ([place]) 1996  . NSTEMI (non-ST elevated myocardial infarction) ([place]) 12/14/2011  . Shortness of breath 12/14/2011   "@ rest, lying down, w/exertion"    Tobacco Use: History  Smoking Status  . Current Every Day Smoker  . Packs/day: 0.50  . Years: 0.50  . Types: Cigarettes  Smokeless Tobacco  . Never Used    Comment: 11/23/16 Still not ready to quit smoking.    Labs: Recent Review Flowsheet Data    Labs for ITP Cardiac and Pulmonary Rehab Latest Ref Rng & Units 12/16/2011 08/08/2016   Cholestrol 0 - 200 mg/dL 141 108  LDLCALC 0 - 99 mg/dL 83 50   HDL >40 mg/dL 38(L) 36(L)   Trlycerides <150 mg/dL 102 111       Exercise Target [name]:    Exercise Program Goal: Individual exercise prescription set with THRR, safety & activity barriers. Participant demonstrates ability to understand and report RPE using BORG scale, to self-measure pulse accurately, and to acknowledge the importance of the exercise prescription.  Exercise Prescription Goal: Starting with aerobic activity 30 plus minutes a day, 3 days per week for initial exercise prescription. Provide home exercise prescription and guidelines that participant acknowledges understanding prior to discharge.  Activity Barriers & Risk Stratification:   6 Minute Walk:     6 Minute Walk    Row Name  10/19/16 1406         6 Minute Walk   Phase Initial     Distance 1200 feet     Walk Time 6 minutes     # of Rest Breaks 0     MPH 2.27     METS 2.29     RPE 12     VO2 Peak 8.02     Symptoms Yes (comment)     Comments hip pain 5/10     Resting HR 87 bpm     Resting BP 148/64     Max Ex. HR 91 bpm     Max Ex. BP 140/80        Oxygen Initial Assessment:   Oxygen Re-Evaluation:   Oxygen Discharge (Final Oxygen Re-Evaluation):   Initial Exercise Prescription:     Initial Exercise Prescription - 10/19/16 1400      Date of Initial Exercise RX and Referring Provider   Date 10/19/16   Referring Provider [name]     Treadmill   MPH 1.7  3/3/3   Grade 0   Minutes 15   METs 2.3     Recumbant Bike   Level 1   RPM 60   Minutes 15   METs 2     REL-XR   Level 1   Speed 60   Minutes 15   METs 2.3     T5 Nustep   Level 1   SPM 80   Minutes 15   METs 2     Prescription Details   Frequency (times per week) 3   Duration Progress to 30 minutes of continuous aerobic without signs/symptoms of physical distress     Intensity   THRR 40-80% of Max Heartrate 109-132   Ratings of Perceived Exertion 11-13   Perceived Dyspnea 0-4     Resistance Training   Training Prescription Yes   Weight 3   Reps 10-15      Perform Capillary Blood Glucose checks as needed.  Exercise Prescription Changes:     Exercise Prescription Changes    Row Name 10/19/16 1300 10/30/16 0900 11/05/16 1100         Response to Exercise   Blood Pressure (Admit) 148/64  - 118/50     Blood Pressure (Exercise) 140/80  - 140/66     Blood Pressure (Exit)  -  - 138/56     Heart Rate (Admit) 80 bpm  - 79 bpm     Heart Rate (Exercise)  -  - 103 bpm     Heart Rate (Exit) 83 bpm  - 58 bpm     Rating of Perceived Exertion (Exercise) 12  - 14     Symptoms  -  - none  Duration  -  - Continue with 45 min of aerobic exercise without signs/symptoms of physical distress.     Intensity  -  -  THRR unchanged       Progression   Progression  -  - Continue to progress workloads to maintain intensity without signs/symptoms of physical distress.     Average METs  -  - 3.07       Resistance Training   Training Prescription  -  - Yes     Weight  -  - 3 lbs     Reps  -  - 10-15       Interval Training   Interval Training  -  - No       Treadmill   MPH  -  - 1.7     Grade  -  - 0     Minutes  -  - 15     METs  -  - 2.3       REL-XR   Level  -  - 1     Minutes  -  - 15     METs  -  - 4.9       T5 Nustep   Level  -  - 5     Minutes  -  - 15     METs  -  - 2       Home Exercise Plan   Plans to continue exercise at  - [employer]     Frequency  - Add 1 additional day to program exercise sessions. Add 1 additional day to program exercise sessions.     Initial Home Exercises Provided  - 10/30/16 10/30/16        Exercise Comments:     Exercise Comments    Row Name 10/26/16 [phone]           Exercise Comments First full day of exercise!  Patient was oriented to gym and equipment including functions, settings, policies, and procedures.  Patient's individual exercise prescription and treatment plan were reviewed.  All starting workloads were established based on the results of the 6 minute walk test done at initial orientation visit.  The plan for exercise progression was also introduced and progression will be customized based on patient's performance and [name].          Exercise [name] and Review:     Exercise [name]    Row Name 10/19/16 1408             Exercise [name]   Increase Physical Activity Yes       Intervention Provide advice, education, support and counseling about physical activity/exercise needs.;Develop an individualized exercise prescription for aerobic and resistive training based on initial evaluation findings, risk stratification, comorbidities and participant's personal [name].       Expected Outcomes Achievement of increased  cardiorespiratory fitness and enhanced flexibility, muscular endurance and strength shown through measurements of functional capacity and personal statement of participant.       Increase Strength and Stamina Yes       Intervention Provide advice, education, support and counseling about physical activity/exercise needs.;Develop an individualized exercise prescription for aerobic and resistive training based on initial evaluation findings, risk stratification, comorbidities and participant's personal [name].       Expected Outcomes Achievement of increased cardiorespiratory fitness and enhanced flexibility, muscular endurance and strength shown through measurements of functional capacity and personal statement of participant.  Exercise [name] Re-Evaluation :     Exercise [name] Re-Evaluation    Row Name 10/30/16 0901 11/05/16 1133 11/23/16 0849         Exercise Goal Re-Evaluation   Exercise [name] Review Increase Physical Activity;Increase Strenth and Stamina Increase Physical Activity;Increase Strenth and Stamina Increase Physical Activity;Increase Strenth and Stamina     Comments Peter [name] has not been exercising outside class.  He does not want to go to a gym and is considering [employer].  He states he can feel a difference in his first week of exercise. Peter [name] is doing well in rehab.  He is up to level 5 on the NuStep and walking on the treadmill is his most difficult piece.  We will continue to work with him on progression. Peter [name] has been walking more at home.  He has found it helpful in trying to lose weight.  He has noticed that his strength and stamina has improved since starting. He was surprised at how well he was able to return to exercise despite having been out for two week.s.     Expected Outcomes Short - Peter [name] will ad 1 day to his exercise routine.  Long - Peter [name] will exercise 3-5 days a wekk on a regular basis. Short: Peter [name] will try to add in some exercise at home.  Long:  Continue to make exercise part of routine. Short: Continue to walk at home.  Long: Contintue to make exercise part of routine.         Discharge Exercise Prescription (Final Exercise Prescription Changes):     Exercise Prescription Changes - 11/05/16 1100      Response to Exercise   Blood Pressure (Admit) 118/50   Blood Pressure (Exercise) 140/66   Blood Pressure (Exit) 138/56   Heart Rate (Admit) 79 bpm   Heart Rate (Exercise) 103 bpm   Heart Rate (Exit) 58 bpm   Rating of Perceived Exertion (Exercise) 14   Symptoms none   Duration Continue with 45 min of aerobic exercise without signs/symptoms of physical distress.   Intensity THRR unchanged     Progression   Progression Continue to progress workloads to maintain intensity without signs/symptoms of physical distress.   Average METs 3.07     Resistance Training   Training Prescription Yes   Weight 3 lbs   Reps 10-15     Interval Training   Interval Training No     Treadmill   MPH 1.7   Grade 0   Minutes 15   METs 2.3     REL-XR   Level 1   Minutes 15   METs 4.9     T5 Nustep   Level 5   Minutes 15   METs 2     Home Exercise Plan   Plans to continue exercise at [employer]   Frequency Add 1 additional day to program exercise sessions.   Initial Home Exercises Provided 10/30/16      Nutrition:  Target [name]: Understanding of nutrition guidelines, daily intake of sodium '1500mg'$ , cholesterol '200mg'$ , calories 30% from fat and 7% or less from saturated fats, daily to have 5 or more servings of fruits and vegetables.  Biometrics:     Pre Biometrics - 10/28/16 1501      Pre Biometrics   Waist Circumference 42.5 inches   Hip Circumference 43.5 inches   Waist to Hip Ratio 0.98 %   Single Leg Stand 1.51 seconds       Nutrition Therapy Plan and Nutrition [name]:  Nutrition Therapy & [name] - 11/23/16 1316      Nutrition Therapy   Diet Instructed on a meal plan based on heart healthy dietary  guidelines   Drug/Food Interactions Statins/Certain Fruits   Protein (specify units) 7   Fiber 25 grams   Whole Grain Foods 3 servings   Saturated Fats 12 max. grams   Fruits and Vegetables 5 servings/day   Sodium 1500 grams     Personal Nutrition [name]   Nutrition Goal Try some low sodium products such as [place] low sodium sauces, StarKist very low sodium tuna, Mrs. DASH lemon pepper and taco seasoning, Nathan french fries   Personal Goal #2 Add low sodium sides such as steamed vegetables and fruits to entrees that have more sodium.    Personal Goal #3 Read labels for saturated fat, trans fat and sodium.   Comments Patient eats out frequently but was interested in simple lower sodium meals that can be prepared at home as well as lower sodium choices when eating "out". Discussed this at length.     Intervention Plan   Intervention Prescribe, educate and counsel regarding individualized specific dietary modifications aiming towards targeted core components such as weight, hypertension, lipid management, diabetes, heart failure and other comorbidities.;Nutrition handout(s) given to patient.   Expected Outcomes Short Term Goal: Understand basic principles of dietary content, such as calories, fat, sodium, cholesterol and nutrients.;Short Term Goal: A plan has been developed with personal nutrition [name] set during dietitian appointment.;Long Term Goal: Adherence to prescribed nutrition plan.      Nutrition Discharge: Rate Your Plate Scores:     Nutrition Assessments - 10/19/16 1339      MEDFICTS Scores   Pre Score 61      Nutrition [name] Re-Evaluation:     Nutrition [name] Re-Evaluation    Row Name 11/23/16 0852             [name]   Current Weight 209 lb (94.8 kg)       Comment has appt to meet with dietician today       Expected Outcome work on poor appetite          Nutrition [name] Discharge (Final Nutrition [name] Re-Evaluation):     Nutrition [name] Re-Evaluation -  11/23/16 0852      [name]   Current Weight 209 lb (94.8 kg)   Comment has appt to meet with dietician today   Expected Outcome work on poor appetite      Psychosocial: Target [name]: Acknowledge presence or absence of significant depression and/or stress, maximize coping skills, provide positive support system. Participant is able to verbalize types and ability to use techniques and skills needed for reducing stress and depression.   Initial Review & Psychosocial Screening:     Initial Psych Review & Screening - 10/19/16 1344      Initial Review   Current issues with None Identified     Family Dynamics   Good Support System? Yes  Good friend Nelida Meuse     Barriers   Psychosocial barriers to participate in program There are no identifiable barriers or psychosocial needs.;The patient should benefit from training in stress management and relaxation.     Screening Interventions   Interventions Encouraged to exercise;Provide feedback about the scores to participant;To provide support and resources with identified psychosocial needs      Quality of Life Scores:      Quality of Life - 10/19/16 1344      Quality of Life Scores  Health/Function Pre 22.87 %   Socioeconomic Pre 23.21 %   Psych/Spiritual Pre 22.5 %   Family Pre 26.5 %   GLOBAL Pre 23.4 %      PHQ-9: Recent Review Flowsheet Data    Depression screen [facility] 2/9 10/19/2016 09/15/2016 08/18/2016   Decreased Interest 0 0 0   Down, Depressed, Hopeless 0 0 0   PHQ - 2 Score 0 0 0   Altered sleeping 0 - -   Tired, decreased energy 1 - -   Change in appetite 0 - -   Feeling bad or failure about yourself  0 - -   Trouble concentrating 0 - -   Moving slowly or fidgety/restless 0 - -   Suicidal thoughts 0 - -   PHQ-9 Score 1 - -   Difficult doing work/chores Not difficult at all - -     Interpretation of Total Score  Total Score Depression Severity:  1-4 = Minimal depression, 5-9 = Mild depression, 10-14 = Moderate  depression, 15-19 = Moderately severe depression, 20-27 = Severe depression   Psychosocial Evaluation and Intervention:     Psychosocial Evaluation - 11/23/16 0931      Psychosocial Evaluation & Interventions   Interventions Encouraged to exercise with the program and follow exercise prescription   Comments Counselor met with Peter [name]) today for initial psychosocial evaluation.  He is a 76 year old who had a stent blockage recently.  He also had a recent fall off a step ladder that took him out of this program for several weeks.  Slade has a strong support system with a significant other; daughter in [place]; Brother in [place]; and a strong men's support group that he actively participates in.  Ozro has minimal health issues and although he doesn't sleep well at night in general, he reports getting about 6 hours in a 24 hour period most days.  His appetite has decreased since the recent cardiac event.  He denies a history of depression or anxiety or any currrent symptoms.  He is typically in a positive mood and has minimal stress reported in his life, other than his health.  Peter [name] has [name] to improve his health and increase his stamina and strength.  Staff will follow him throughout the course of this program.     Expected Outcomes Peter [name].  He is scheduled to meet with the dietician to address his sodium restrictions.  The educational components of this program will also be helpful for Peter [name] to learn ways to improve his health and manage his disease.     Continue Psychosocial Services  Follow up required by staff      Psychosocial Re-Evaluation:     Psychosocial Re-Evaluation    [place] Name 11/23/16 [phone]             Psychosocial Re-Evaluation   Current issues with Current Sleep Concerns       Comments Peter [name] does not have any major stressors.  He just lets things go and sloughs them off.   Looking  back he realizes just what stress really was and now its now where near as bad. He does not have a good sleep routine and thus continues to wake several times at night. But he consistently gets 6-7 hours a night       Expected Outcomes Short: Continue to maintain postive attitude.  Long: Work on stress routine.  Psychosocial Discharge (Final Psychosocial Re-Evaluation):     Psychosocial Re-Evaluation - 11/23/16 0855      Psychosocial Re-Evaluation   Current issues with Current Sleep Concerns   Comments Peter [name] does not have any major stressors.  He just lets things go and sloughs them off.   Looking back he realizes just what stress really was and now its now where near as bad. He does not have a good sleep routine and thus continues to wake several times at night. But he consistently gets 6-7 hours a night   Expected Outcomes Short: Continue to maintain postive attitude.  Long: Work on stress routine.      Vocational Rehabilitation: Provide vocational rehab assistance to qualifying candidates.   Vocational Rehab Evaluation & Intervention:   Education: Education [name]: Education classes will be provided on a weekly basis, covering required topics. Participant will state understanding/return demonstration of topics presented.  Learning Barriers/Preferences:     Learning Barriers/Preferences - 10/19/16 1347      Learning Barriers/Preferences   Learning Barriers None   Learning Preferences None      Education Topics: General Nutrition Guidelines/Fats and Fiber: -Group instruction provided by verbal, written material, models and posters to present the general guidelines for heart healthy nutrition. Gives an explanation and review of dietary fats and fiber.   Controlling Sodium/Reading Food Labels: -Group verbal and written material supporting the discussion of sodium use in heart healthy nutrition. Review and explanation with models, verbal and written materials for  utilization of the food label.   Exercise Physiology & Risk Factors: - Group verbal and written instruction with models to review the exercise physiology of the cardiovascular system and associated critical values. Details cardiovascular disease risk factors and the [name] associated with each risk factor.   Aerobic Exercise & Resistance Training: - Gives group verbal and written discussion on the health impact of inactivity. On the components of aerobic and resistive training programs and the benefits of this training and how to safely progress through these programs.   Flexibility, Balance, General Exercise Guidelines: - Provides group verbal and written instruction on the benefits of flexibility and balance training programs. Provides general exercise guidelines with specific guidelines to those with heart or lung disease. Demonstration and skill practice provided.   Cardiac Rehab from 11/23/2016 in [facility] Cardiac and Pulmonary Rehab  Date  10/26/16  Educator  [facility]  Instruction Review Code  2- meets [name]/outcomes      Stress Management: - Provides group verbal and written instruction about the health risks of elevated stress, cause of high stress, and healthy ways to reduce stress.   [facility]  Date  11/04/16  Educator  [facility]  Instruction Review Code  2- meets [name]/outcomes      Depression: - Provides group verbal and written instruction on the correlation between heart/lung disease and depressed mood, treatment options, and the stigmas associated with seeking treatment.   Anatomy & Physiology of the Heart: - Group verbal and written instruction and models provide basic cardiac anatomy and physiology, with the coronary electrical and arterial systems. Review of: AMI, Angina, Valve disease, Heart Failure, Cardiac Arrhythmia, Pacemakers, and the ICD.   Cardiac Rehab from 11/23/2016 in [facility] Cardiac and Pulmonary Rehab  Date  11/02/16   Educator  CE  Instruction Review Code  2- meets [name]/outcomes      Cardiac Procedures: - Group verbal and written instruction and models to describe the testing methods done to  diagnose heart disease. Reviews the outcomes of the test results. Describes the treatment choices: Medical Management, Angioplasty, or Coronary Bypass Surgery.   Cardiac Medications: - Group verbal and written instruction to review commonly prescribed medications for heart disease. Reviews the medication, class of the drug, and side effects. Includes the steps to properly store meds and maintain the prescription regimen.   Go Sex-Intimacy & Heart Disease, Get SMART - Goal Setting: - Group verbal and written instruction through game format to discuss heart disease and the return to sexual intimacy. Provides group verbal and written material to discuss and apply goal setting through the application of the S.M.A.R.T. Method.   Other Matters of the Heart: - Provides group verbal, written materials and models to describe Heart Failure, Angina, Valve Disease, and Diabetes in the realm of heart disease. Includes description of the disease process and treatment options available to the cardiac patient.   Cardiac Rehab from 11/23/2016 in [facility] Cardiac and Pulmonary Rehab  Date  11/02/16  Educator  CE  Instruction Review Code  2- meets [name]/outcomes      Exercise & Equipment Safety: - Individual verbal instruction and demonstration of equipment use and safety with use of the equipment.   Cardiac Rehab from 11/23/2016 in [facility] Cardiac and Pulmonary Rehab  Date  10/19/16  Educator  Sb  Instruction Review Code  2- meets [name]/outcomes      Infection Prevention: - Provides verbal and written material to individual with discussion of infection control including proper hand washing and proper equipment cleaning during exercise session.   Cardiac Rehab from 11/23/2016 in [facility] Cardiac and Pulmonary Rehab  Date  10/19/16   Educator  Sb  Instruction Review Code  2- meets [name]/outcomes      Falls Prevention: - Provides verbal and written material to individual with discussion of falls prevention and safety.   Cardiac Rehab from 11/23/2016 in [facility] Cardiac and Pulmonary Rehab  Date  10/19/16  Educator  SB  Instruction Review Code  2- meets [name]/outcomes      Diabetes: - Individual verbal and written instruction to review signs/symptoms of diabetes, desired ranges of glucose level fasting, after meals and with exercise. Advice that pre and post exercise glucose checks will be done for 3 sessions at entry of program.    Knowledge Questionnaire Score:     Knowledge Questionnaire Score - 10/19/16 1347      Knowledge Questionnaire Score   Pre Score 23/28  Correct responses reviewed with Peter [name] today. He verbalized understanding of responses and had no questions today.      Core Components/Risk Factors/Patient [name] at Admission:     Personal [name] and Risk Factors at Admission - 10/19/16 1524      Core Components/Risk Factors/Patient [name] on Admission    Weight Management Yes;Weight Maintenance   Admit Weight 210 lb 14.4 oz (95.7 kg)   Goal Weight: Short Term 210 lb (95.3 kg)   Goal Weight: Long Term 210 lb (95.3 kg)   Expected Outcomes Short Term: Continue to assess and modify interventions until short term weight is achieved;Long Term: Adherence to nutrition and physical activity/exercise program aimed toward attainment of established weight goal;Weight Maintenance: Understanding of the daily nutrition guidelines, which includes 25-35% calories from fat, 7% or less cal from saturated fats, less than '200mg'$  cholesterol, less than 1.5gm of sodium, & 5 or more servings of fruits and vegetables daily   Intervention Assist the participant in steps to quit. Provide individualized education and counseling about committing  to Tobacco Cessation, relapse prevention, and pharmacological support that can be  provided by physician.;Advice worker, assist with locating and accessing local/national Quit Smoking programs, and support quit date choice.   Expected Outcomes Short Term: Will demonstrate readiness to quit, by selecting a quit date.;Short Term: Will quit all tobacco product use, adhering to prevention of relapse plan.;Long Term: Complete abstinence from all tobacco products for at least 12 months from quit date.   Hypertension Yes   Intervention Provide education on lifestyle modifcations including regular physical activity/exercise, weight management, moderate sodium restriction and increased consumption of fresh fruit, vegetables, and low fat dairy, alcohol moderation, and smoking cessation.;Monitor prescription use compliance.   Expected Outcomes Short Term: Continued assessment and intervention until BP is < 140/57m HG in hypertensive participants. < 130/878mHG in hypertensive participants with diabetes, heart failure or chronic kidney disease.;Long Term: Maintenance of blood pressure at goal levels.   Lipids Yes   Intervention Provide education and support for participant on nutrition & aerobic/resistive exercise along with prescribed medications to achieve LDL '70mg'$ , HDL >'40mg'$ .   Expected Outcomes Short Term: Participant states understanding of desired cholesterol values and is compliant with medications prescribed. Participant is following exercise prescription and nutrition guidelines.;Long Term: Cholesterol controlled with medications as prescribed, with individualized exercise RX and with personalized nutrition plan. Value [name]: LDL < '70mg'$ , HDL > 40 mg.      Core Components/Risk Factors/Patient [name] Review:      [name] and Risk Factor Review    Row Name 11/23/16 0839             Core Components/Risk Factors/Patient [name] Review   Personal [name] Review Weight Management/Obesity;Tobacco Cessation;Hypertension;Lipids       Review Peter [name]'s weight is down some since  starting rehab.  He does not have much of an appetite and has lost some weight.  He is watching what he is eating and cut back on sugar and salt.  His blood pressures have been good.  He says that cutting back on salt and losing weight has helped.  He does not have any problems with his medications other than he misses doses on occasion.  He is still not ready to quit smoking. He has cut back to half a pack a day.  He says that it is really the habit more than the need for nicotine.  He doesn't smoke around other people.  He knows that the only way to quit is to make the decision to quit himself.       Expected Outcomes Short: Continue to work on weight loss.  Long: Continue to work on risk factor modification and consider quitting smoking.           Core Components/Risk Factors/Patient [name] at Discharge (Final Review):      [name] and Risk Factor Review - 11/23/16 0839      Core Components/Risk Factors/Patient [name] Review   Personal [name] Review Weight Management/Obesity;Tobacco Cessation;Hypertension;Lipids   Review Peter [name]'s weight is down some since starting rehab.  He does not have much of an appetite and has lost some weight.  He is watching what he is eating and cut back on sugar and salt.  His blood pressures have been good.  He says that cutting back on salt and losing weight has helped.  He does not have any problems with his medications other than he misses doses on occasion.  He is still not ready to quit smoking. He has cut back to half a pack a  day.  He says that it is really the habit more than the need for nicotine.  He doesn't smoke around other people.  He knows that the only way to quit is to make the decision to quit himself.   Expected Outcomes Short: Continue to work on weight loss.  Long: Continue to work on risk factor modification and consider quitting smoking.       ITP Comments:     ITP Comments    Row Name 10/19/16 1322 10/28/16 0636 11/09/16 0745 11/20/16 0908  11/25/16 0158   ITP Comments Medical review completed. Initial IPT created. Documentation of diagnosis can be found in [facility] 08/10/2016 encounter 30 day review. Continue with ITP unless directed changes per Medical Director review     New start  Peter [name] came in this morning for class.  He said he was having a hard time sitting down and was very achy.  Over the weekend, he fell off the bottom step of a ladder.  We encouraged him to rest and seek medical care if symptoms get worse.  Called to check on status of return.  Check he had fallen off ladder.  The pain is moving down leg.  He is afraid it could be a blood clot.  I encouraged him to go see a doctor.  He hopes to try to return next Wednesday. 30 day review. Continue with ITP unless directed changes per Medical Director review       Comments:

## 2016-11-27 ENCOUNTER — Encounter: Payer: PPO | Admitting: *Deleted

## 2016-11-27 DIAGNOSIS — I5022 Chronic systolic (congestive) heart failure: Secondary | ICD-10-CM

## 2016-11-27 DIAGNOSIS — Z955 Presence of coronary angioplasty implant and graft: Secondary | ICD-10-CM

## 2016-11-27 DIAGNOSIS — I110 Hypertensive heart disease with heart failure: Secondary | ICD-10-CM | POA: Diagnosis not present

## 2016-11-27 NOTE — Progress Notes (Signed)
Daily Session Note  Patient Details  Name: Peter [name] MRN: [id number] Date of Birth: September 24, 1940 Referring Provider:     Cardiac Rehab from 10/19/2016 in [facility] Cardiac and Pulmonary Rehab  Referring Provider  [name]      Encounter Date: 11/27/2016  Check In:     Session Check In - 11/27/16 0753      Check-In   Location [facility]-Cardiac & Pulmonary Rehab   Staff Present Gerlene Burdock, RN, Vickki Hearing, BA, ACSM CEP, Exercise Physiologist;Latysha Thackston Frederico Hamman, RN BSN   Supervising physician immediately available to respond to emergencies See telemetry face sheet for immediately available ER MD   Medication changes reported     No   Fall or balance concerns reported    No   Tobacco Cessation No Change   Warm-up and Cool-down Performed on first and last piece of equipment   Resistance Training Performed Yes   VAD Patient? No     Pain Assessment   Currently in Pain? No/denies   Multiple Pain Sites No         History  Smoking Status  . Current Every Day Smoker  . Packs/day: 0.50  . Years: 0.50  . Types: Cigarettes  Smokeless Tobacco  . Never Used    Comment: 11/23/16 Still not ready to quit smoking.    Goals Met:  Independence with exercise equipment Exercise tolerated well No report of cardiac concerns or symptoms Strength training completed today  Goals Unmet:  Not Applicable  Comments: Pt able to follow exercise prescription today without complaint.  Will continue to monitor for progression.    Dr. Emily Filbert is Medical Director for [name] and [facility].

## 2016-11-30 ENCOUNTER — Encounter: Payer: PPO | Admitting: *Deleted

## 2016-11-30 DIAGNOSIS — I5022 Chronic systolic (congestive) heart failure: Secondary | ICD-10-CM

## 2016-11-30 DIAGNOSIS — Z955 Presence of coronary angioplasty implant and graft: Secondary | ICD-10-CM

## 2016-11-30 DIAGNOSIS — I110 Hypertensive heart disease with heart failure: Secondary | ICD-10-CM | POA: Diagnosis not present

## 2016-11-30 NOTE — Progress Notes (Signed)
Daily Session Note  Patient Details  Name: Peter [name] MRN: [id number] Date of Birth: 1940/12/30 Referring Provider:     Cardiac Rehab from 10/19/2016 in [facility] Cardiac and Pulmonary Rehab  Referring Provider  [name]      Encounter Date: 11/30/2016  Check In:     Session Check In - 11/30/16 0840      Check-In   Location [facility]-Cardiac & Pulmonary Rehab   Staff Present Gerlene Burdock, RN, Levie Heritage, MA, ACSM RCEP, Exercise Physiologist;Nissim Fleischer Amedeo Plenty, BS, ACSM CEP, Exercise Physiologist   Supervising physician immediately available to respond to emergencies See telemetry face sheet for immediately available ER MD   Medication changes reported     No   Fall or balance concerns reported    No   Tobacco Cessation No Change   Warm-up and Cool-down Performed on first and last piece of equipment   Resistance Training Performed Yes   VAD Patient? No     Pain Assessment   Currently in Pain? No/denies   Multiple Pain Sites No         History  Smoking Status  . Current Every Day Smoker  . Packs/day: 0.50  . Years: 0.50  . Types: Cigarettes  Smokeless Tobacco  . Never Used    Comment: 11/23/16 Still not ready to quit smoking.    Goals Met:  Independence with exercise equipment Exercise tolerated well No report of cardiac concerns or symptoms Strength training completed today  Goals Unmet:  Not Applicable  Comments: Pt able to follow exercise prescription today without complaint.  Will continue to monitor for progression.    Dr. Emily Filbert is Medical Director for [place] and [facility].

## 2016-12-02 DIAGNOSIS — I110 Hypertensive heart disease with heart failure: Secondary | ICD-10-CM | POA: Diagnosis not present

## 2016-12-02 DIAGNOSIS — Z955 Presence of coronary angioplasty implant and graft: Secondary | ICD-10-CM

## 2016-12-02 DIAGNOSIS — I5022 Chronic systolic (congestive) heart failure: Secondary | ICD-10-CM

## 2016-12-02 NOTE — Progress Notes (Signed)
Daily Session Note  Patient Details  Name: Peter [name] MRN: [id number] Date of Birth: 11-21-1940 Referring Provider:     Cardiac Rehab from 10/19/2016 in [facility] Cardiac and Pulmonary Rehab  Referring Provider  [name]      Encounter Date: 12/02/2016  Check In:     Session Check In - 12/02/16 0828      Check-In   Location [facility]-Cardiac & Pulmonary Rehab   Staff Present Heath Lark, RN, BSN, CCRP;Joseph Darrin Nipper, [place], ACSM RCEP, Exercise Physiologist   Supervising physician immediately available to respond to emergencies See telemetry face sheet for immediately available ER MD   Medication changes reported     No   Fall or balance concerns reported    No   Warm-up and Cool-down Performed on first and last piece of equipment   Resistance Training Performed Yes   VAD Patient? No     Pain Assessment   Currently in Pain? No/denies   Multiple Pain Sites No           Exercise Prescription Changes - 12/01/16 1600      Response to Exercise   Blood Pressure (Admit) 140/62   Blood Pressure (Exercise) 146/64   Blood Pressure (Exit) 134/74   Heart Rate (Admit) 88 bpm   Heart Rate (Exercise) 109 bpm   Heart Rate (Exit) 67 bpm   Rating of Perceived Exertion (Exercise) 13   Symptoms none   Duration Continue with 45 min of aerobic exercise without signs/symptoms of physical distress.   Intensity THRR unchanged     Progression   Progression Continue to progress workloads to maintain intensity without signs/symptoms of physical distress.   Average METs 3.15     Resistance Training   Training Prescription Yes   Weight 3 lbs   Reps 10-15     Interval Training   Interval Training No     Treadmill   MPH 1.7   Grade 0.5   Minutes 15   METs 2.54     REL-XR   Level 5   Minutes 15   METs 4.7     T5 Nustep   Level 5   Minutes 15   METs 2.2     Home Exercise Plan   Plans to continue exercise at Home (comment)  walking with plans to join  [employer] after graduation   Frequency Add 1 additional day to program exercise sessions.   Initial Home Exercises Provided 10/30/16      History  Smoking Status  . Current Every Day Smoker  . Packs/day: 0.50  . Years: 0.50  . Types: Cigarettes  Smokeless Tobacco  . Never Used    Comment: 11/23/16 Still not ready to quit smoking.    Goals Met:  Independence with exercise equipment Exercise tolerated well No report of cardiac concerns or symptoms Strength training completed today  Goals Unmet:  Not Applicable  Comments: Pt able to follow exercise prescription today without complaint.  Will continue to monitor for progression.   Dr. Emily Filbert is Medical Director for [place] and [facility].

## 2016-12-04 ENCOUNTER — Encounter: Payer: PPO | Admitting: *Deleted

## 2016-12-04 DIAGNOSIS — I110 Hypertensive heart disease with heart failure: Secondary | ICD-10-CM | POA: Diagnosis not present

## 2016-12-04 DIAGNOSIS — I5022 Chronic systolic (congestive) heart failure: Secondary | ICD-10-CM

## 2016-12-04 DIAGNOSIS — Z955 Presence of coronary angioplasty implant and graft: Secondary | ICD-10-CM

## 2016-12-04 NOTE — Progress Notes (Signed)
Daily Session Note  Patient Details  Name: Peter [name] MRN: [id number] Date of Birth: 07-25-1940 Referring Provider:     Cardiac Rehab from 10/19/2016 in [facility] Cardiac and Pulmonary Rehab  Referring Provider  [name]      Encounter Date: 12/04/2016  Check In:     Session Check In - 12/04/16 0848      Check-In   Location [facility]-Cardiac & Pulmonary Rehab   Staff Present Alberteen Sam, MA, ACSM RCEP, Exercise Physiologist;Amanda Oletta Darter, BA, ACSM CEP, Exercise Physiologist;Carroll Enterkin, RN, BSN   Supervising physician immediately available to respond to emergencies See telemetry face sheet for immediately available ER MD   Medication changes reported     No   Fall or balance concerns reported    No   Tobacco Cessation No Change   Warm-up and Cool-down Performed on first and last piece of equipment   Resistance Training Performed Yes   VAD Patient? No     Pain Assessment   Currently in Pain? No/denies   Multiple Pain Sites No         History  Smoking Status  . Current Every Day Smoker  . Packs/day: 0.50  . Years: 0.50  . Types: Cigarettes  Smokeless Tobacco  . Never Used    Comment: 11/23/16 Still not ready to quit smoking.    Goals Met:  Independence with exercise equipment Exercise tolerated well No report of cardiac concerns or symptoms Strength training completed today  Goals Unmet:  Not Applicable  Comments: Pt able to follow exercise prescription today without complaint.  Will continue to monitor for progression.    Dr. Emily Filbert is Medical Director for [place] and [facility].

## 2016-12-07 ENCOUNTER — Encounter: Payer: PPO | Admitting: *Deleted

## 2016-12-07 DIAGNOSIS — I5022 Chronic systolic (congestive) heart failure: Secondary | ICD-10-CM | POA: Diagnosis not present

## 2016-12-07 DIAGNOSIS — I110 Hypertensive heart disease with heart failure: Secondary | ICD-10-CM | POA: Diagnosis not present

## 2016-12-07 DIAGNOSIS — Z955 Presence of coronary angioplasty implant and graft: Secondary | ICD-10-CM

## 2016-12-07 DIAGNOSIS — Z720 Tobacco use: Secondary | ICD-10-CM | POA: Diagnosis not present

## 2016-12-07 DIAGNOSIS — R490 Dysphonia: Secondary | ICD-10-CM | POA: Diagnosis not present

## 2016-12-07 DIAGNOSIS — I2510 Atherosclerotic heart disease of native coronary artery without angina pectoris: Secondary | ICD-10-CM | POA: Diagnosis not present

## 2016-12-07 DIAGNOSIS — R0982 Postnasal drip: Secondary | ICD-10-CM | POA: Diagnosis not present

## 2016-12-07 DIAGNOSIS — Z0000 Encounter for general adult medical examination without abnormal findings: Secondary | ICD-10-CM | POA: Diagnosis not present

## 2016-12-07 NOTE — Progress Notes (Signed)
Daily Session Note  Patient Details  Name: Peter [name] MRN: [id number] Date of Birth: 1940-09-06 Referring Provider:     Cardiac Rehab from 10/19/2016 in [facility] Cardiac and Pulmonary Rehab  Referring Provider  [name]      Encounter Date: 12/07/2016  Check In:     Session Check In - 12/07/16 0844      Check-In   Location [facility]-Cardiac & Pulmonary Rehab   Staff Present Alberteen Sam, MA, ACSM RCEP, Exercise Physiologist;Carroll Enterkin, RN, Moises Blood, BS, ACSM CEP, Exercise Physiologist   Supervising physician immediately available to respond to emergencies See telemetry face sheet for immediately available ER MD   Medication changes reported     No   Fall or balance concerns reported    No   Tobacco Cessation No Change   Warm-up and Cool-down Performed on first and last piece of equipment   Resistance Training Performed Yes   VAD Patient? No     Pain Assessment   Currently in Pain? No/denies   Multiple Pain Sites No         History  Smoking Status  . Current Every Day Smoker  . Packs/day: 0.50  . Years: 0.50  . Types: Cigarettes  Smokeless Tobacco  . Never Used    Comment: 11/23/16 Still not ready to quit smoking.    Goals Met:  Independence with exercise equipment Exercise tolerated well No report of cardiac concerns or symptoms Strength training completed today  Goals Unmet:  Not Applicable  Comments: Pt able to follow exercise prescription today without complaint.  Will continue to monitor for progression.    Dr. Emily Filbert is Medical Director for [place] and [facility].

## 2016-12-09 DIAGNOSIS — I110 Hypertensive heart disease with heart failure: Secondary | ICD-10-CM | POA: Diagnosis not present

## 2016-12-09 DIAGNOSIS — Z955 Presence of coronary angioplasty implant and graft: Secondary | ICD-10-CM

## 2016-12-09 DIAGNOSIS — I5022 Chronic systolic (congestive) heart failure: Secondary | ICD-10-CM

## 2016-12-09 NOTE — Progress Notes (Signed)
Daily Session Note  Patient Details  Name: Peter [name] MRN: [id number] Date of Birth: 05-Apr-1941 Referring Provider:     Cardiac Rehab from 10/19/2016 in [facility] Cardiac and Pulmonary Rehab  Referring Provider  [name]      Encounter Date: 12/09/2016  Check In:     Session Check In - 12/09/16 0752      Check-In   Staff Present Heath Lark, RN, BSN, CCRP;Jessica Luan Pulling, MA, ACSM RCEP, Exercise Physiologist;Benay Pomeroy Flavia Shipper   Supervising physician immediately available to respond to emergencies See telemetry face sheet for immediately available ER MD   Medication changes reported     No   Fall or balance concerns reported    No   Warm-up and Cool-down Performed on first and last piece of equipment   Resistance Training Performed Yes   VAD Patient? No     Pain Assessment   Currently in Pain? No/denies   Multiple Pain Sites No         History  Smoking Status  . Current Every Day Smoker  . Packs/day: 0.50  . Years: 0.50  . Types: Cigarettes  Smokeless Tobacco  . Never Used    Comment: 11/23/16 Still not ready to quit smoking.    Goals Met:  Independence with exercise equipment Exercise tolerated well Personal goals reviewed No report of cardiac concerns or symptoms Strength training completed today  Goals Unmet:  Not Applicable  Comments: Pt able to follow exercise prescription today without complaint.  Will continue to monitor for progression.   Dr. Emily Filbert is Medical Director for [place] and [facility].

## 2016-12-11 DIAGNOSIS — Z955 Presence of coronary angioplasty implant and graft: Secondary | ICD-10-CM

## 2016-12-11 DIAGNOSIS — I110 Hypertensive heart disease with heart failure: Secondary | ICD-10-CM | POA: Diagnosis not present

## 2016-12-11 DIAGNOSIS — I5022 Chronic systolic (congestive) heart failure: Secondary | ICD-10-CM

## 2016-12-11 NOTE — Progress Notes (Signed)
Daily Session Note  Patient Details  Name: Peter [name] MRN: [id number] Date of Birth: 10-04-1940 Referring Provider:     Cardiac Rehab from 10/19/2016 in [facility] Cardiac and Pulmonary Rehab  Referring Provider  [name]      Encounter Date: 12/11/2016  Check In:     Session Check In - 12/11/16 9278      Check-In   Location [facility]-Cardiac & Pulmonary Rehab   Staff Present Gerlene Burdock, RN, BSN;Jessica Luan Pulling, MA, ACSM RCEP, Exercise Physiologist;Jaskiran Pata Oletta Darter, [place], ACSM CEP, Exercise Physiologist   Supervising physician immediately available to respond to emergencies See telemetry face sheet for immediately available ER MD   Medication changes reported     No   Fall or balance concerns reported    No   Warm-up and Cool-down Performed on first and last piece of equipment   Resistance Training Performed Yes   VAD Patient? No     Pain Assessment   Currently in Pain? No/denies         History  Smoking Status  . Current Every Day Smoker  . Packs/day: 0.50  . Years: 0.50  . Types: Cigarettes  Smokeless Tobacco  . Never Used    Comment: 11/23/16 Still not ready to quit smoking.    Goals Met:  Independence with exercise equipment Exercise tolerated well No report of cardiac concerns or symptoms Strength training completed today  Goals Unmet:  Not Applicable  Comments: Pt able to follow exercise prescription today without complaint.  Will continue to monitor for progression.    Dr. Emily Filbert is Medical Director for [name].

## 2016-12-16 ENCOUNTER — Ambulatory Visit: Payer: PPO | Admitting: Family

## 2016-12-16 ENCOUNTER — Encounter: Payer: PPO | Attending: Cardiology

## 2016-12-16 DIAGNOSIS — I5022 Chronic systolic (congestive) heart failure: Secondary | ICD-10-CM | POA: Diagnosis not present

## 2016-12-16 DIAGNOSIS — H3530 Unspecified macular degeneration: Secondary | ICD-10-CM | POA: Insufficient documentation

## 2016-12-16 DIAGNOSIS — I110 Hypertensive heart disease with heart failure: Secondary | ICD-10-CM | POA: Diagnosis not present

## 2016-12-16 DIAGNOSIS — E7800 Pure hypercholesterolemia, unspecified: Secondary | ICD-10-CM | POA: Diagnosis not present

## 2016-12-16 DIAGNOSIS — J45909 Unspecified asthma, uncomplicated: Secondary | ICD-10-CM | POA: Insufficient documentation

## 2016-12-16 DIAGNOSIS — I252 Old myocardial infarction: Secondary | ICD-10-CM | POA: Diagnosis not present

## 2016-12-16 DIAGNOSIS — I2510 Atherosclerotic heart disease of native coronary artery without angina pectoris: Secondary | ICD-10-CM | POA: Insufficient documentation

## 2016-12-16 DIAGNOSIS — Z7902 Long term (current) use of antithrombotics/antiplatelets: Secondary | ICD-10-CM | POA: Diagnosis not present

## 2016-12-16 DIAGNOSIS — Z7982 Long term (current) use of aspirin: Secondary | ICD-10-CM | POA: Insufficient documentation

## 2016-12-16 DIAGNOSIS — F17210 Nicotine dependence, cigarettes, uncomplicated: Secondary | ICD-10-CM | POA: Insufficient documentation

## 2016-12-16 DIAGNOSIS — Z955 Presence of coronary angioplasty implant and graft: Secondary | ICD-10-CM | POA: Diagnosis not present

## 2016-12-16 DIAGNOSIS — Z79899 Other long term (current) drug therapy: Secondary | ICD-10-CM | POA: Insufficient documentation

## 2016-12-16 NOTE — Progress Notes (Signed)
Daily Session Note  Patient Details  Name: Peter [name] MRN: [id number] Date of Birth: 1940/07/10 Referring Provider:     Cardiac Rehab from 10/19/2016 in [facility] Cardiac and Pulmonary Rehab  Referring Provider  [name]      Encounter Date: 12/16/2016  Check In:     Session Check In - 12/16/16 0739      Check-In   Location [facility]-Cardiac & Pulmonary Rehab   Staff Present Heath Lark, RN, BSN, CCRP;Jessica Luan Pulling, MA, ACSM RCEP, Exercise Physiologist;Dinnis Rog Flavia Shipper   Supervising physician immediately available to respond to emergencies See telemetry face sheet for immediately available ER MD   Medication changes reported     No   Fall or balance concerns reported    No   Tobacco Cessation No Change   Warm-up and Cool-down Performed on first and last piece of equipment   Resistance Training Performed Yes   VAD Patient? No     Pain Assessment   Currently in Pain? No/denies   Multiple Pain Sites No         History  Smoking Status  . Current Every Day Smoker  . Packs/day: 0.50  . Years: 0.50  . Types: Cigarettes  Smokeless Tobacco  . Never Used    Comment: 11/23/16 Still not ready to quit smoking.    Goals Met:  Independence with exercise equipment Exercise tolerated well No report of cardiac concerns or symptoms Strength training completed today  Goals Unmet:  Not Applicable  Comments: Pt able to follow exercise prescription today without complaint.  Will continue to monitor for progression.   Dr. Emily Filbert is Medical Director for [place] and [facility].

## 2016-12-18 ENCOUNTER — Encounter: Payer: Self-pay | Admitting: Family

## 2016-12-18 ENCOUNTER — Encounter: Payer: PPO | Admitting: *Deleted

## 2016-12-18 ENCOUNTER — Ambulatory Visit: Payer: PPO | Attending: Family | Admitting: Family

## 2016-12-18 VITALS — BP 152/57 | HR 61 | Resp 18 | Ht 70.0 in | Wt 211.2 lb

## 2016-12-18 DIAGNOSIS — I110 Hypertensive heart disease with heart failure: Secondary | ICD-10-CM | POA: Diagnosis not present

## 2016-12-18 DIAGNOSIS — I5022 Chronic systolic (congestive) heart failure: Secondary | ICD-10-CM

## 2016-12-18 DIAGNOSIS — Z7982 Long term (current) use of aspirin: Secondary | ICD-10-CM | POA: Insufficient documentation

## 2016-12-18 DIAGNOSIS — H3530 Unspecified macular degeneration: Secondary | ICD-10-CM | POA: Insufficient documentation

## 2016-12-18 DIAGNOSIS — I10 Essential (primary) hypertension: Secondary | ICD-10-CM

## 2016-12-18 DIAGNOSIS — Z955 Presence of coronary angioplasty implant and graft: Secondary | ICD-10-CM

## 2016-12-18 DIAGNOSIS — E7800 Pure hypercholesterolemia, unspecified: Secondary | ICD-10-CM | POA: Insufficient documentation

## 2016-12-18 DIAGNOSIS — J45909 Unspecified asthma, uncomplicated: Secondary | ICD-10-CM | POA: Insufficient documentation

## 2016-12-18 DIAGNOSIS — E785 Hyperlipidemia, unspecified: Secondary | ICD-10-CM | POA: Insufficient documentation

## 2016-12-18 DIAGNOSIS — I2510 Atherosclerotic heart disease of native coronary artery without angina pectoris: Secondary | ICD-10-CM | POA: Diagnosis not present

## 2016-12-18 DIAGNOSIS — I252 Old myocardial infarction: Secondary | ICD-10-CM | POA: Insufficient documentation

## 2016-12-18 DIAGNOSIS — E876 Hypokalemia: Secondary | ICD-10-CM | POA: Insufficient documentation

## 2016-12-18 DIAGNOSIS — Z720 Tobacco use: Secondary | ICD-10-CM

## 2016-12-18 DIAGNOSIS — F17210 Nicotine dependence, cigarettes, uncomplicated: Secondary | ICD-10-CM | POA: Diagnosis not present

## 2016-12-18 LAB — BASIC METABOLIC PANEL
Anion gap: 8 (ref 5–15)
BUN: 25 mg/dL — AB (ref 6–20)
CHLORIDE: 103 mmol/L (ref 101–111)
CO2: 29 mmol/L (ref 22–32)
Calcium: 8.7 mg/dL — ABNORMAL LOW (ref 8.9–10.3)
Creatinine, Ser: 1.56 mg/dL — ABNORMAL HIGH (ref 0.61–1.24)
GFR calc Af Amer: 48 mL/min — ABNORMAL LOW (ref >60)
GFR calc non Af Amer: 41 mL/min — ABNORMAL LOW (ref >60)
GLUCOSE: 98 mg/dL (ref 65–99)
Potassium: 4 mmol/L (ref 3.5–5.1)
SODIUM: 140 mmol/L (ref 135–145)

## 2016-12-18 NOTE — Progress Notes (Signed)
Daily Session Note  Patient Details  Name: Peter [name] MRN: [id number] Date of Birth: 1940/10/08 Referring Provider:     Cardiac Rehab from 10/19/2016 in [facility] Cardiac and Pulmonary Rehab  Referring Provider  [name]      Encounter Date: 12/18/2016  Check In:     Session Check In - 12/18/16 0807      Check-In   Location [facility]-Cardiac & Pulmonary Rehab   Staff Present Alberteen Sam, MA, ACSM RCEP, Exercise Physiologist;Amanda Oletta Darter, BA, ACSM CEP, Exercise Physiologist;Carroll Enterkin, RN, BSN   Supervising physician immediately available to respond to emergencies See telemetry face sheet for immediately available ER MD   Medication changes reported     No   Fall or balance concerns reported    No   Warm-up and Cool-down Performed on first and last piece of equipment   Resistance Training Performed Yes   VAD Patient? No     Pain Assessment   Currently in Pain? No/denies   Multiple Pain Sites No         History  Smoking Status  . Current Every Day Smoker  . Packs/day: 0.50  . Years: 0.50  . Types: Cigarettes  Smokeless Tobacco  . Never Used    Comment: 11/23/16 Still not ready to quit smoking.    Goals Met:  Independence with exercise equipment Exercise tolerated well No report of cardiac concerns or symptoms Strength training completed today  Goals Unmet:  Not Applicable  Comments: Pt able to follow exercise prescription today without complaint.  Will continue to monitor for progression.    Dr. Emily Filbert is Medical Director for [place] and [facility].

## 2016-12-18 NOTE — Progress Notes (Signed)
Patient ID: Peter [name], male    DOB: [birth year], 76 y.o.   MRN: [id number]  HPI  Mr Allbaugh is a 76 y/o male with a history of asthma, CAD, hyperlipidemia, HTN, hypokalemia, MI, NSTEMI, multiple stents, current tobacco use and chronic heart failure.   Echo report done 10/01/16 reviewed and shows an EF of 35% along with moderate MR/TR. Reviewed echo report on 08/08/16 which showed an EF of 35-40% along with mild AR and moderate MR/TR. EF has declined from a previous echo done 12/15/11. Cardiac catheterization done 08/10/16 showed one vessel CAD with high-grade in stent restenosis in the proximal LAD. DES successfully placed in proximal LAD.  Admitted 09/07/16 with HF exacerbation. Given IV diuretics and transitioned to oral diuretics with a weight loss of 4 kg. Discharged home after 4 days. Admitted 08/07/16 with acute HF along with CAD. Initially treated with IV diuretics and transitioned to oral diuretics. Lost ~ 6.2L of fluid during hospitalization. Cardiology consult obtained and catheterization done. Discharged home after 4 days.   He presents today for a follow-up visit today with a chief complaint of mild shortness of breath upon moderate exertion. He describes this as chronic in nature having been present for several years with varying levels of severity. He has associated fatigue and light-headedness along with this. He denies chest pain, edema, palpitations or weight gain.   Past Medical History:  Diagnosis Date  . Anginal pain ([place])   . Asthma   . Bladder infection, acute 03/2011   "had a whole lot of bleeding from this"  . CHF (congestive heart failure) ([place])   . Coronary artery disease   . High cholesterol   . Hypertension   . 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 Myocardial infarction ([place]) 1996  . NSTEMI (non-ST elevated myocardial infarction) ([place]) 12/14/2011  . Shortness of breath 12/14/2011   "@ rest, lying down, w/exertion"    Past Surgical History:  Procedure Laterality Date  . [place]; ~ 2003; ~ 2008   "total of 3"  . CORONARY STENT INTERVENTION N/A 08/10/2016   Procedure: Coronary Stent Intervention;  Surgeon: [name], MD;  Location: [facility];  Service: Cardiovascular;  Laterality: N/A;  . HERNIA REPAIR  ~ 2001   "abdominal w/mesh implanted"  . LEFT HEART CATH AND CORONARY ANGIOGRAPHY N/A 08/10/2016   Procedure: Left Heart Cath and Coronary Angiography;  Surgeon: [name], MD;  Location: [name];  Service: Cardiovascular;  Laterality: N/A;  . LEFT HEART CATHETERIZATION WITH CORONARY ANGIOGRAM N/A 12/16/2011   Procedure: LEFT HEART CATHETERIZATION WITH CORONARY ANGIOGRAM;  Surgeon: Minus Breeding, MD;  Location: [facility] CATH LAB;  Service: Cardiovascular;  Laterality: N/A;  . PERCUTANEOUS CORONARY STENT INTERVENTION (PCI-S) N/A 12/17/2011   Procedure: PERCUTANEOUS CORONARY STENT INTERVENTION (PCI-S);  Surgeon: Sherren Mocha, MD;  Location: [facility] C[facility];  Service: Cardiovascular;  Laterality: N/A;  . TONSILLECTOMY     "I was a kid"   Family History  Problem Relation Age of Onset  . Family history unknown: Yes   Social History  Substance Use Topics  . Smoking status: Current Every Day Smoker    Packs/day: 0.50    Years: 0.50    Types: Cigarettes  . Smokeless tobacco: Never Used     Comment: 11/23/16 Still not ready to quit smoking.  . Alcohol use 0.6 oz/week    1 Cans of  beer per week     Comment: 12/14/2011 "if my kidneys are sore, I drink 1 beer/day; if not sore; no beer; occasionally drink socially; ave 1 beer/wk maybe"   Allergies  Allergen Reactions  . Iodinated Diagnostic Agents Shortness Of Breath  . Iodinated Glycerol  [Glycerol, Iodinated] Shortness Of Breath   Prior to Admission medications   Medication Sig Start Date End Date Taking? Authorizing Provider  amLODipine-benazepril (LOTREL) 10-40 MG capsule TAKE 1  CAPSULE BY MOUTH EVERY DAY 10/21/15  Yes [provider]  aspirin 325 MG tablet Take 325 mg by mouth 2 (two) times daily. Patient takes if there is any pain or burning in the chest.   Yes [provider]  atorvastatin (LIPITOR) 40 MG tablet Take 1 tablet (40 mg total) by mouth daily at 6 PM. 12/18/11  Yes Annita Brod, MD  carvedilol (COREG) 3.125 MG tablet Take 3.125 mg by mouth 2 (two) times daily with a meal.   Yes [provider]  cetirizine (WAL-ZYR) 10 MG tablet Take 10 mg by mouth.   Yes [provider]  clobetasol cream (TEMOVATE) 0.05 % APPLY TO PSORIASIS BID PRN UNTIL SMOOTH 11/05/14  Yes [provider]  clopidogrel (PLAVIX) 75 MG tablet TAKE 1 TABLET(75 MG) BY MOUTH EVERY DAY 08/11/16  Yes Fritzi Mandes, MD  cyanocobalamin 1000 MCG tablet Take 1,000 mcg by mouth daily.    Yes [provider]  furosemide (LASIX) 20 MG tablet Take 1 tablet (20 mg total) by mouth daily. Patient taking differently: Take 20 mg by mouth 2 (two) times daily.  09/10/16 09/10/17 Yes Epifanio Lesches, MD  GLUCOSAMINE-CHONDROITIN-VIT C PO Take 1 tablet by mouth daily.   Yes [provider]  hydrochlorothiazide (HYDRODIURIL) 12.5 MG tablet Take 12.5 mg by mouth daily.  04/22/15  Yes [provider]  isosorbide mononitrate (IMDUR) 30 MG 24 hr tablet Take 30 mg by mouth daily. 06/18/16  Yes [provider]  omeprazole (PRILOSEC) 20 MG capsule Take by mouth. 01/16/16  Yes [provider]  spironolactone (ALDACTONE) 25 MG tablet Take 0.5 tablets by mouth daily. 10/06/16 10/06/17 Yes [provider]  tamsulosin (FLOMAX) 0.4 MG CAPS capsule TAKE 1 CAPSULE BY MOUTH EVERY DAY 10/28/15  Yes [provider]   Review of Systems  Constitutional: Positive for fatigue (minimal). Negative for appetite change.  HENT: Positive for congestion. Negative for rhinorrhea and sore throat.   Eyes: Negative.   Respiratory: Positive for  shortness of breath (occasionally). Negative for cough and chest tightness.   Cardiovascular: Negative for chest pain, palpitations and leg swelling.  Gastrointestinal: Negative for abdominal distention and abdominal pain.  Endocrine: Negative.   Genitourinary: Negative.   Musculoskeletal: Negative for back pain and neck pain.  Skin: Negative.   Allergic/Immunologic: Negative.   Neurological: Positive for dizziness and light-headedness.  Hematological: Negative for adenopathy. Bruises/bleeds easily.  Psychiatric/Behavioral: Negative for dysphoric mood and sleep disturbance (sleeping well). The patient is not nervous/anxious.    Vitals:   12/18/16 1016  BP: (!) 152/57  Pulse: 61  Resp: 18  SpO2: 97%  Weight: 211 lb 4 oz (95.8 kg)  Height: 5\' 10"  (1.778 m)   Wt Readings from Last 3 Encounters:  12/18/16 211 lb 4 oz (95.8 kg)  10/19/16 210 lb 14.4 oz (95.7 kg)  09/15/16 217 lb 2 oz (98.5 kg)   Lab Results  Component Value Date   CREATININE 1.56 (H) 12/18/2016   CREATININE 1.52 (H) 09/10/2016   CREATININE  1.51 (H) 09/09/2016   Physical Exam  Constitutional: He is oriented to person, place, and time. He appears well-developed and well-nourished.  HENT:  Head: Normocephalic and atraumatic.  Neck: Normal range of motion. Neck supple. No JVD present.  Cardiovascular: Regular rhythm.  Bradycardia present.   Pulmonary/Chest: Effort normal. He has no wheezes. He has no rales.  Abdominal: Soft. He exhibits no distension. There is no tenderness.  Musculoskeletal: He exhibits no edema or tenderness.  Neurological: He is alert and oriented to person, place, and time.  Skin: Skin is warm and dry.  Psychiatric: He has a normal mood and affect. His behavior is normal. Thought content normal.  Nursing note and vitals reviewed.  Assessment & Plan:  1: Chronic heart failure with reduced ejection fraction- - NYHA class II - euvolemic today - weighing daily. Weight down 6 pounds since he  was last here. Instructed to call for an overnight weight gain of >2 pounds or a weekly weight gain of >5 pounds - not adding salt and has been reading food labels. Reviewed the importance of closely following a 2000mg  sodium diet - isn't interested in entresto at this time; will continue to discuss with him - saw cardiologist (Dr. Clayborn Bigness) 10/06/16 and returns December 2018 - had spironolactone added (12.5mg ) and potassium was stopped - will get a BMP today since spironolactone was started  2: HTN- - BP looks good today - saw PCP (Hande) 12/07/16  3: Tobacco use- - smoking 1 ppd of cigarettes - Complete cessation discussed for 3 minutes with him   Patient did not bring his medications nor a list. Each medication was verbally reviewed with the patient and he was encouraged to bring the bottles to every visit to confirm accuracy of list  Return in 6 months or sooner for any questions/problems before then.

## 2016-12-18 NOTE — Patient Instructions (Addendum)
Continue weighing daily and call for an overnight weight gain of > 2 pounds or a weekly weight gain of >5 pounds.    Smoking Cessation Quitting smoking is important to your health and has many advantages. However, it is not always easy to quit since nicotine is a very addictive drug. Oftentimes, people try 3 times or more before being able to quit. This document explains the best ways for you to prepare to quit smoking. Quitting takes hard work and a lot of effort, but you can do it. ADVANTAGES OF QUITTING SMOKING  You will live longer, feel better, and live better.  Your body will feel the impact of quitting smoking almost immediately.  Within 20 minutes, blood pressure decreases. Your pulse returns to its normal level.  After 8 hours, carbon monoxide levels in the blood return to normal. Your oxygen level increases.  After 24 hours, the chance of having a heart attack starts to decrease. Your breath, hair, and body stop smelling like smoke.  After 48 hours, damaged nerve endings begin to recover. Your sense of taste and smell improve.  After 72 hours, the body is virtually free of nicotine. Your bronchial tubes relax and breathing becomes easier.  After 2 to 12 weeks, lungs can hold more air. Exercise becomes easier and circulation improves.  The risk of having a heart attack, stroke, cancer, or lung disease is greatly reduced.  After 1 year, the risk of coronary heart disease is cut in half.  After 5 years, the risk of stroke falls to the same as a nonsmoker.  After 10 years, the risk of lung cancer is cut in half and the risk of other cancers decreases significantly.  After 15 years, the risk of coronary heart disease drops, usually to the level of a nonsmoker.  If you are pregnant, quitting smoking will improve your chances of having a healthy baby.  The people you live with, especially any children, will be healthier.  You will have extra money to spend on things other  than cigarettes. QUESTIONS TO THINK ABOUT BEFORE ATTEMPTING TO QUIT You may want to talk about your answers with your health care provider.  Why do you want to quit?  If you tried to quit in the past, what helped and what did not?  What will be the most difficult situations for you after you quit? How will you plan to handle them?  Who can help you through the tough times? Your family? Friends? A health care provider?  What pleasures do you get from smoking? What ways can you still get pleasure if you quit? Here are some questions to ask your health care provider:  How can you help me to be successful at quitting?  What medicine do you think would be best for me and how should I take it?  What should I do if I need more help?  What is smoking withdrawal like? How can I get information on withdrawal? GET READY  Set a quit date.  Change your environment by getting rid of all cigarettes, ashtrays, matches, and lighters in your home, car, or work. Do not let people smoke in your home.  Review your past attempts to quit. Think about what worked and what did not. GET SUPPORT AND ENCOURAGEMENT You have a better chance of being successful if you have help. You can get support in many ways.  Tell your family, friends, and coworkers that you are going to quit and need their support. Ask   them not to smoke around you.  Get individual, group, or telephone counseling and support. Programs are available at local hospitals and health centers. Call your local health department for information about programs in your area.  Spiritual beliefs and practices may help some smokers quit.  Download a "quit meter" on your computer to keep track of quit statistics, such as how long you have gone without smoking, cigarettes not smoked, and money saved.  Get a self-help book about quitting smoking and staying off tobacco. LEARN NEW SKILLS AND BEHAVIORS  Distract yourself from urges to smoke. Talk to  someone, go for a walk, or occupy your time with a task.  Change your normal routine. Take a different route to work. Drink tea instead of coffee. Eat breakfast in a different place.  Reduce your stress. Take a hot bath, exercise, or read a book.  Plan something enjoyable to do every day. Reward yourself for not smoking.  Explore interactive web-based programs that specialize in helping you quit. GET MEDICINE AND USE IT CORRECTLY Medicines can help you stop smoking and decrease the urge to smoke. Combining medicine with the above behavioral methods and support can greatly increase your chances of successfully quitting smoking.  Nicotine replacement therapy helps deliver nicotine to your body without the negative effects and risks of smoking. Nicotine replacement therapy includes nicotine gum, lozenges, inhalers, nasal sprays, and skin patches. Some may be available over-the-counter and others require a prescription.  Antidepressant medicine helps people abstain from smoking, but how this works is unknown. This medicine is available by prescription.  Nicotinic receptor partial agonist medicine simulates the effect of nicotine in your brain. This medicine is available by prescription. Ask your health care provider for advice about which medicines to use and how to use them based on your health history. Your health care provider will tell you what side effects to look out for if you choose to be on a medicine or therapy. Carefully read the information on the package. Do not use any other product containing nicotine while using a nicotine replacement product.  RELAPSE OR DIFFICULT SITUATIONS Most relapses occur within the first 3 months after quitting. Do not be discouraged if you start smoking again. Remember, most people try several times before finally quitting. You may have symptoms of withdrawal because your body is used to nicotine. You may crave cigarettes, be irritable, feel very hungry, cough  often, get headaches, or have difficulty concentrating. The withdrawal symptoms are only temporary. They are strongest when you first quit, but they will go away within 10-14 days. To reduce the chances of relapse, try to:  Avoid drinking alcohol. Drinking lowers your chances of successfully quitting.  Reduce the amount of caffeine you consume. Once you quit smoking, the amount of caffeine in your body increases and can give you symptoms, such as a rapid heartbeat, sweating, and anxiety.  Avoid smokers because they can make you want to smoke.  Do not let weight gain distract you. Many smokers will gain weight when they quit, usually less than 10 pounds. Eat a healthy diet and stay active. You can always lose the weight gained after you quit.  Find ways to improve your mood other than smoking. FOR MORE INFORMATION  www.smokefree.gov  Document Released: 03/24/2001 Document Revised: 08/14/2013 Document Reviewed: 07/09/2011 Exit[employer] Patient Information 2015 [employer]. This information is not intended to replace advice given to you by your health care provider. Make sure you discuss any questions you have with your   health care provider.  

## 2016-12-21 ENCOUNTER — Encounter: Payer: PPO | Admitting: *Deleted

## 2016-12-21 DIAGNOSIS — Z955 Presence of coronary angioplasty implant and graft: Secondary | ICD-10-CM

## 2016-12-21 DIAGNOSIS — I110 Hypertensive heart disease with heart failure: Secondary | ICD-10-CM | POA: Diagnosis not present

## 2016-12-21 DIAGNOSIS — I5022 Chronic systolic (congestive) heart failure: Secondary | ICD-10-CM

## 2016-12-21 NOTE — Progress Notes (Signed)
Daily Session Note  Patient Details  Name: Peter [name] MRN: [id number] Date of Birth: 07/10/1940 Referring Provider:     Cardiac Rehab from 10/19/2016 in [facility]  Referring Provider  [name]      Encounter Date: 12/21/2016  Check In:     Session Check In - 12/21/16 0752      Check-In   Location [facility]   Staff Present Kelly Hayes, BS, ACSM CEP, Exercise Physiologist;Carroll Enterkin, RN, BSN;Jessica Hawkins, MA, ACSM RCEP, Exercise Physiologist   Supervising physician immediately available to respond to emergencies See telemetry face sheet for immediately available ER MD   Medication changes reported     No   Fall or balance concerns reported    No   Tobacco Cessation No Change   Warm-up and Cool-down Performed on first and last piece of equipment   Resistance Training Performed Yes   VAD Patient? No     Pain Assessment   Currently in Pain? No/denies   Multiple Pain Sites No         History  Smoking Status  . Current Every Day Smoker  . Packs/day: 0.50  . Years: 0.50  . Types: Cigarettes  Smokeless Tobacco  . Never Used    Comment: 11/23/16 Still not ready to quit smoking.    Goals Met:  Independence with exercise equipment Exercise tolerated well No report of cardiac concerns or symptoms Strength training completed today  Goals Unmet:  Not Applicable  Comments: Pt able to follow exercise prescription today without complaint.  Will continue to monitor for progression.    Dr. Mark Miller is Medical Director for [facility] and [facility]. 

## 2016-12-22 DIAGNOSIS — H353232 Exudative age-related macular degeneration, bilateral, with inactive choroidal neovascularization: Secondary | ICD-10-CM | POA: Diagnosis not present

## 2016-12-23 ENCOUNTER — Encounter: Payer: Self-pay | Admitting: *Deleted

## 2016-12-23 VITALS — Ht 70.75 in | Wt 209.9 lb

## 2016-12-23 DIAGNOSIS — I5022 Chronic systolic (congestive) heart failure: Secondary | ICD-10-CM

## 2016-12-23 DIAGNOSIS — I110 Hypertensive heart disease with heart failure: Secondary | ICD-10-CM | POA: Diagnosis not present

## 2016-12-23 DIAGNOSIS — Z955 Presence of coronary angioplasty implant and graft: Secondary | ICD-10-CM

## 2016-12-23 NOTE — Progress Notes (Signed)
Cardiac Individual Treatment Plan  Patient Details  Name: Peter [name] MRN: [id number] Date of Birth: June 19, 1940 Referring Provider:     Cardiac Rehab from 10/19/2016 in [facility] Cardiac and Pulmonary Rehab  Referring Provider  [name]      Initial Encounter Date:    Cardiac Rehab from 10/19/2016 in [facility] Cardiac and Pulmonary Rehab  Date  10/19/16  Referring Provider  [name]      Visit Diagnosis: Heart failure, chronic systolic ([place])  Patient's Home Medications on Admission:  Current Outpatient Prescriptions:  .  amLODipine-benazepril (LOTREL) 10-40 MG capsule, TAKE 1 CAPSULE BY MOUTH EVERY DAY, Disp: , Rfl:  .  aspirin 325 MG tablet, Take 325 mg by mouth 2 (two) times daily. Patient takes if there is any pain or burning in the chest., Disp: , Rfl:  .  atorvastatin (LIPITOR) 40 MG tablet, Take 1 tablet (40 mg total) by mouth daily at 6 PM., Disp: 30 tablet, Rfl: 0 .  carvedilol (COREG) 3.125 MG tablet, Take 3.125 mg by mouth 2 (two) times daily with a meal., Disp: , Rfl:  .  cetirizine (WAL-ZYR) 10 MG tablet, Take 10 mg by mouth., Disp: , Rfl:  .  clobetasol cream (TEMOVATE) 0.05 %, APPLY TO PSORIASIS BID PRN UNTIL SMOOTH, Disp: , Rfl: 1 .  clopidogrel (PLAVIX) 75 MG tablet, TAKE 1 TABLET(75 MG) BY MOUTH EVERY DAY, Disp: 30 tablet, Rfl: 1 .  cyanocobalamin 1000 MCG tablet, Take 1,000 mcg by mouth daily. , Disp: , Rfl:  .  furosemide (LASIX) 20 MG tablet, Take 1 tablet (20 mg total) by mouth daily. (Patient taking differently: Take 20 mg by mouth 2 (two) times daily. ), Disp: 30 tablet, Rfl: 1 .  GLUCOSAMINE-CHONDROITIN-VIT C PO, Take 1 tablet by mouth daily., Disp: , Rfl:  .  hydrochlorothiazide (HYDRODIURIL) 12.5 MG tablet, Take 12.5 mg by mouth daily. , Disp: , Rfl:  .  isosorbide mononitrate (IMDUR) 30 MG 24 hr tablet, Take 30 mg by mouth daily., Disp: , Rfl:  .  omeprazole (PRILOSEC) 20 MG capsule, Take by mouth., Disp: , Rfl:  .  spironolactone (ALDACTONE) 25 MG tablet, Take  0.5 tablets by mouth daily., Disp: , Rfl:  .  tamsulosin (FLOMAX) 0.4 MG CAPS capsule, TAKE 1 CAPSULE BY MOUTH EVERY DAY, Disp: , Rfl:   Past Medical History: Past Medical History:  Diagnosis Date  . Anginal pain ([place])   . Asthma   . Bladder infection, acute 03/2011   "had a whole lot of bleeding from this"  . CHF (congestive heart failure) ([place])   . Coronary artery disease   . High cholesterol   . Hypertension   . 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 Myocardial infarction ([place]) 1996  . NSTEMI (non-ST elevated myocardial infarction) ([place]) 12/14/2011  . Shortness of breath 12/14/2011   "@ rest, lying down, w/exertion"    Tobacco Use: History  Smoking Status  . Current Every Day Smoker  . Packs/day: 0.50  . Years: 0.50  . Types: Cigarettes  Smokeless Tobacco  . Never Used    Comment: 11/23/16 Still not ready to quit smoking.    Labs: Recent Review Flowsheet Data    Labs for ITP Cardiac and Pulmonary Rehab Latest Ref Rng & Units 12/16/2011 08/08/2016   Cholestrol 0 - 200 mg/dL 141 108   LDLCALC 0 - 99 mg/dL 83 50   HDL >40 mg/dL 38(L) 36(L)   Trlycerides <  150 mg/dL 102 111       Exercise Target Goals:    Exercise Program Goal: Individual exercise prescription set with THRR, safety & activity barriers. Participant demonstrates ability to understand and report RPE using BORG scale, to self-measure pulse accurately, and to acknowledge the importance of the exercise prescription.  Exercise Prescription Goal: Starting with aerobic activity 30 plus minutes a day, 3 days per week for initial exercise prescription. Provide home exercise prescription and guidelines that participant acknowledges understanding prior to discharge.  Activity Barriers & Risk Stratification:   6 Minute Walk:     6 Minute Walk    Row Name 10/19/16 1406 12/23/16 0839       6 Minute Walk   Phase Initial Discharge    Distance 1200 feet 1365 feet     Distance % Change  - 13.8 %    Distance Feet Change  - 165 ft    Walk Time 6 minutes 6 minutes    # of Rest Breaks 0 0    MPH 2.27 2.59    METS 2.29 3.1    RPE 12 13    VO2 Peak 8.02 10.85    Symptoms Yes (comment) Yes (comment)    Comments hip pain 5/10 hip pain 5/10    Resting HR 87 bpm 75 bpm    Resting BP 148/64 130/58    Max Ex. HR 91 bpm 100 bpm    Max Ex. BP 140/80 196/84    2 Minute Post BP  - 156/80       Oxygen Initial Assessment:   Oxygen Re-Evaluation:   Oxygen Discharge (Final Oxygen Re-Evaluation):   Initial Exercise Prescription:     Initial Exercise Prescription - 10/19/16 1400      Date of Initial Exercise RX and Referring Provider   Date 10/19/16   Referring Provider [name]     Treadmill   MPH 1.7  3/3/3   Grade 0   Minutes 15   METs 2.3     Recumbant Bike   Level 1   RPM 60   Minutes 15   METs 2     REL-XR   Level 1   Speed 60   Minutes 15   METs 2.3     T5 Nustep   Level 1   SPM 80   Minutes 15   METs 2     Prescription Details   Frequency (times per week) 3   Duration Progress to 30 minutes of continuous aerobic without signs/symptoms of physical distress     Intensity   THRR 40-80% of Max Heartrate 109-132   Ratings of Perceived Exertion 11-13   Perceived Dyspnea 0-4     Resistance Training   Training Prescription Yes   Weight 3   Reps 10-15      Perform Capillary Blood Glucose checks as needed.  Exercise Prescription Changes:     Exercise Prescription Changes    Row Name 10/19/16 1300 10/30/16 0900 11/05/16 1100 12/01/16 1600 12/16/16 1500     Response to Exercise   Blood Pressure (Admit) 148/64  - 118/50 140/62 124/64   Blood Pressure (Exercise) 140/80  - 140/66 146/64 178/70   Blood Pressure (Exit)  -  - 138/56 134/74 122/52   Heart Rate (Admit) 80 bpm  - 79 bpm 88 bpm 77 bpm   Heart Rate (Exercise)  -  - 103 bpm 109 bpm 94 bpm   Heart Rate (Exit) 83 bpm  - 58 bpm 67  bpm 65 bpm   Rating of  Perceived Exertion (Exercise) 12  - _0 Symptoms  -  - none none hip pain on treadmill   Duration  -  - Continue with 45 min of aerobic exercise without signs/symptoms of physical distress. Continue with 45 min of aerobic exercise without signs/symptoms of physical distress. Continue with 45 min of aerobic exercise without signs/symptoms of physical distress.   Intensity  -  - THRR unchanged THRR unchanged THRR unchanged     Progression   Progression  -  - Continue to progress workloads to maintain intensity without signs/symptoms of physical distress. Continue to progress workloads to maintain intensity without signs/symptoms of physical distress. Continue to progress workloads to maintain intensity without signs/symptoms of physical distress.   Average METs  -  - 3.07 3.15 3.12     Resistance Training   Training Prescription  -  - Yes Yes Yes   Weight  -  - 3 lbs 3 lbs 3 lbs   Reps  -  - 10-15 10-15 10-15     Interval Training   Interval Training  -  - No No No     Treadmill   MPH  -  - 1.7 1.7 1.7   Grade  -  - 0 0.5 0.5   Minutes  -  - _1 METs  -  - 2.3 2.54 2.42     REL-XR   Level  -  - _2 Minutes  -  - _3 METs  -  - 4.9 4.7 4.8     T5 Nustep   Level  -  - _4 Minutes  -  - _5 METs  -  - 2 2.2 2.1     Home Exercise Plan   Plans to continue exercise at  - [employer] (comment)  walking with plans to join [employer] after graduation Home (comment)  walking with plans to join [employer] after graduation   Frequency  - Add 1 additional day to program exercise sessions. Add 1 additional day to program exercise sessions. Add 1 additional day to program exercise sessions. Add 1 additional day to program exercise sessions.   Initial Home Exercises Provided  - 10/30/16 10/30/16 10/30/16 10/30/16      Exercise Comments:     Exercise Comments    Row Name 10/26/16 [phone]           Exercise Comments First full day of  exercise!  Patient was oriented to gym and equipment including functions, settings, policies, and procedures.  Patient's individual exercise prescription and treatment plan were reviewed.  All starting workloads were established based on the results of the 6 minute walk test done at initial orientation visit.  The plan for exercise progression was also introduced and progression will be customized based on patient's performance and goals.          Exercise Goals and Review:     Exercise Goals    Row Name 10/19/16 1408             Exercise Goals   Increase Physical Activity Yes       Intervention Provide advice, education, support and counseling about physical activity/exercise needs.;Develop an individualized exercise prescription for aerobic and resistive training based on initial evaluation findings, risk stratification, comorbidities and participant's personal goals.  Expected Outcomes Achievement of increased cardiorespiratory fitness and enhanced flexibility, muscular endurance and strength shown through measurements of functional capacity and personal statement of participant.       Increase Strength and Stamina Yes       Intervention Provide advice, education, support and counseling about physical activity/exercise needs.;Develop an individualized exercise prescription for aerobic and resistive training based on initial evaluation findings, risk stratification, comorbidities and participant's personal goals.       Expected Outcomes Achievement of increased cardiorespiratory fitness and enhanced flexibility, muscular endurance and strength shown through measurements of functional capacity and personal statement of participant.          Exercise Goals Re-Evaluation :     Exercise Goals Re-Evaluation    Row Name 10/30/16 0901 11/05/16 1133 11/23/16 0849 12/01/16 1618 12/09/16 0833     Exercise Goal Re-Evaluation   Exercise Goals Review Increase Physical Activity;Increase  Strenth and Stamina Increase Physical Activity;Increase Strenth and Stamina Increase Physical Activity;Increase Strenth and Stamina Increase Physical Activity;Increase Strenth and Stamina Increase Physical Activity;Increase Strength and Stamina;Understanding of Exercise Prescription   Comments Peter [name] has not been exercising outside class.  He does not want to go to a gym and is considering [employer].  He states he can feel a difference in his first week of exercise. Peter [name] is doing well in rehab.  He is up to level 5 on the NuStep and walking on the treadmill is his most difficult piece.  We will continue to work with him on progression. Peter [name] has been walking more at home.  He has found it helpful in trying to lose weight.  He has noticed that his strength and stamina has improved since starting. He was surprised at how well he was able to return to exercise despite having been out for two week.s. Peter [name] continues to do well in rehab.  He is getting stronger and able to do more at home. He still struggles with walking on the treadmill due to hip pain, but he has been able to increase other workloads.  We will continue to monitor his progression. Peter [name] has been doing well in rehab.  He was able to add some incline to the treadmill today!!  He is still only walking some at home due to his hip pain.  However, he does feel that his strength and stamina are getting better and he is able to do more around the house..   Expected Outcomes Short - Peter [name] will ad 1 day to his exercise routine.  Long - Keanu will exercise 3-5 days a wekk on a regular basis. Short: Taison will try to add in some exercise at home.  Long: Continue to make exercise part of routine. Short: Continue to walk at home.  Long: Contintue to make exercise part of routine.  Short: Continue to try to walk more at home.  Long: Continue to make exercise part of routine.  Short: Try to walk more at home.  Long: Aim to get in some exercise  everyday.    [place] Name 12/16/16 1536 12/23/16 0840           Exercise Goal Re-Evaluation   Exercise Goals Review Increase Physical Activity;Increase Strength and Stamina Increase Physical Activity;Increase Strength and Stamina      Comments Peter [name] contineus to do well in rehab.  He is up to level 7 on the XR.  We will continue to monitor his progression. Improved walk test by 165 ft!!!      Expected Outcomes  Short: Continue to try to walk as much as possible.  Long: Make exercise part of routine.  Short: [employer].  Long: Continue to walk on own.         Discharge Exercise Prescription (Final Exercise Prescription Changes):     Exercise Prescription Changes - 12/16/16 1500      Response to Exercise   Blood Pressure (Admit) 124/64   Blood Pressure (Exercise) 178/70   Blood Pressure (Exit) 122/52   Heart Rate (Admit) 77 bpm   Heart Rate (Exercise) 94 bpm   Heart Rate (Exit) 65 bpm   Rating of Perceived Exertion (Exercise) 13   Symptoms hip pain on treadmill   Duration Continue with 45 min of aerobic exercise without signs/symptoms of physical distress.   Intensity THRR unchanged     Progression   Progression Continue to progress workloads to maintain intensity without signs/symptoms of physical distress.   Average METs 3.12     Resistance Training   Training Prescription Yes   Weight 3 lbs   Reps 10-15     Interval Training   Interval Training No     Treadmill   MPH 1.7   Grade 0.5   Minutes 15   METs 2.42     REL-XR   Level 7   Minutes 15   METs 4.8     T5 Nustep   Level 5   Minutes 15   METs 2.1     Home Exercise Plan   Plans to continue exercise at Home (comment)  walking with plans to join [employer] after graduation   Frequency Add 1 additional day to program exercise sessions.   Initial Home Exercises Provided 10/30/16      Nutrition:  Target Goals: Understanding of nutrition guidelines, daily intake of sodium <1543m, cholesterol <2023m  calories 30% from fat and 7% or less from saturated fats, daily to have 5 or more servings of fruits and vegetables.  Biometrics:     Pre Biometrics - 10/28/16 1501      Pre Biometrics   Waist Circumference 42.5 inches   Hip Circumference 43.5 inches   Waist to Hip Ratio 0.98 %   Single Leg Stand 1.51 seconds         Post Biometrics - 12/23/16 0844       Post  Biometrics   Height 5' 10.75" (1.797 m)   Weight 209 lb 14.4 oz (95.2 kg)   Waist Circumference 44 inches   Hip Circumference 43.5 inches   Waist to Hip Ratio 1.01 %   BMI (Calculated) 29.48   Single Leg Stand 2.21 seconds      Nutrition Therapy Plan and Nutrition Goals:     Nutrition Therapy & Goals - 11/23/16 1316      Nutrition Therapy   Diet Instructed on a meal plan based on heart healthy dietary guidelines   Drug/Food Interactions Statins/Certain Fruits   Protein (specify units) 7   Fiber 25 grams   Whole Grain Foods 3 servings   Saturated Fats 12 max. grams   Fruits and Vegetables 5 servings/day   Sodium 1500 grams     Personal Nutrition Goals   Nutrition Goal Try some low sodium products such as Vi[place]ow sodium sauces, StarKist very low sodium tuna, Mrs. DASH lemon pepper and taco seasoning, Nathan french fries   Personal Goal #2 Add low sodium sides such as steamed vegetables and fruits to entrees that have more sodium.    Personal Goal #3 Read  labels for saturated fat, trans fat and sodium.   Comments Patient eats out frequently but was interested in simple lower sodium meals that can be prepared at home as well as lower sodium choices when eating "out". Discussed this at length.     Intervention Plan   Intervention Prescribe, educate and counsel regarding individualized specific dietary modifications aiming towards targeted core components such as weight, hypertension, lipid management, diabetes, heart failure and other comorbidities.;Nutrition handout(s) given to patient.   Expected Outcomes  Short Term Goal: Understand basic principles of dietary content, such as calories, fat, sodium, cholesterol and nutrients.;Short Term Goal: A plan has been developed with personal nutrition goals set during dietitian appointment.;Long Term Goal: Adherence to prescribed nutrition plan.      Nutrition Discharge: Rate Your Plate Scores:     Nutrition Assessments - 10/19/16 1339      MEDFICTS Scores   Pre Score 61      Nutrition Goals Re-Evaluation:     Nutrition Goals Re-Evaluation    [place] Name 11/23/16 0852 12/09/16 0841           Goals   Current Weight 209 lb (94.8 kg) 212 lb (96.2 kg)      Nutrition Goal  - Try some low sodium products such as [place] low sodium sauces, StarKist very low sodium tuna, Mrs. DASH lemon pepper and taco seasoning, Nathan french fries; Read labels, steam vegetables and fruits      Comment has appt to meet with dietician today Jatin has really started watching his sodium intake very closely.  He uses Mrs. Dash routinely.  He also has really focused on reading his food labels.  However, he does not choose to eat frequently.       Expected Outcome work on poor appetite Short: Try to eat more frequently.  Long: Continue to work on weight loss and sodium intake.          Nutrition Goals Discharge (Final Nutrition Goals Re-Evaluation):     Nutrition Goals Re-Evaluation - 12/09/16 0841      Goals   Current Weight 212 lb (96.2 kg)   Nutrition Goal Try some low sodium products such as [place] low sodium sauces, StarKist very low sodium tuna, Mrs. DASH lemon pepper and taco seasoning, Nathan french fries; Read labels, steam vegetables and fruits   Comment Bertrand has really started watching his sodium intake very closely.  He uses Mrs. Dash routinely.  He also has really focused on reading his food labels.  However, he does not choose to eat frequently.    Expected Outcome Short: Try to eat more frequently.  Long: Continue to work on weight loss and sodium  intake.       Psychosocial: Target Goals: Acknowledge presence or absence of significant depression and/or stress, maximize coping skills, provide positive support system. Participant is able to verbalize types and ability to use techniques and skills needed for reducing stress and depression.   Initial Review & Psychosocial Screening:     Initial Psych Review & Screening - 10/19/16 1344      Initial Review   Current issues with None Identified     Family Dynamics   Good Support System? Yes  Good friend Peter [name]     Barriers   Psychosocial barriers to participate in program There are no identifiable barriers or psychosocial needs.;The patient should benefit from training in stress management and relaxation.     Screening Interventions   Interventions Encouraged to exercise;Provide feedback about  the scores to participant;To provide support and resources with identified psychosocial needs      Quality of Life Scores:      Quality of Life - 10/19/16 1344      Quality of Life Scores   Health/Function Pre 22.87 %   Socioeconomic Pre 23.21 %   Psych/Spiritual Pre 22.5 %   Family Pre 26.5 %   GLOBAL Pre 23.4 %      PHQ-9: Recent Review Flowsheet Data    Depression screen [facility] 2/9 12/18/2016 10/19/2016 09/15/2016 08/18/2016   Decreased Interest 0 0 0 0   Down, Depressed, Hopeless 0 0 0 0   PHQ - 2 Score 0 0 0 0   Altered sleeping - 0 - -   Tired, decreased energy - 1 - -   Change in appetite - 0 - -   Feeling bad or failure about yourself  - 0 - -   Trouble concentrating - 0 - -   Moving slowly or fidgety/restless - 0 - -   Suicidal thoughts - 0 - -   PHQ-9 Score - 1 - -   Difficult doing work/chores - Not difficult at all - -     Interpretation of Total Score  Total Score Depression Severity:  1-4 = Minimal depression, 5-9 = Mild depression, 10-14 = Moderate depression, 15-19 = Moderately severe depression, 20-27 = Severe depression   Psychosocial Evaluation and  Intervention:     Psychosocial Evaluation - 11/23/16 0931      Psychosocial Evaluation & Interventions   Interventions Encouraged to exercise with the program and follow exercise prescription   Comments Counselor met with [name]) today for initial psychosocial evaluation.  He is a 76 year old who had a stent blockage recently.  He also had a recent fall off a step ladder that took him out of this program for several weeks.  Caedin has a strong support system with a significant other; daughter in [place]; Brother in [place]; and a strong men's support group that he actively participates in.  Baird has minimal health issues and although he doesn't sleep well at night in general, he reports getting about 6 hours in a 24 hour period most days.  His appetite has decreased since the recent cardiac event.  He denies a history of depression or anxiety or any currrent symptoms.  He is typically in a positive mood and has minimal stress reported in his life, other than his health.  Stiven has goals to improve his health and increase his stamina and strength.  Staff will follow him throughout the course of this program.     Expected Outcomes [place]ury will benefit from consistent exercise to achieve his stated goals.  He is scheduled to meet with the dietician to address his sodium restrictions.  The educational components of this program will also be helpful for Peter [name] to learn ways to improve his health and manage his disease.     Continue Psychosocial Services  Follow up required by staff      Psychosocial Re-Evaluation:     Psychosocial Re-Evaluation    Row Name 11/23/16 0855 12/09/16 0925           Psychosocial Re-Evaluation   Current issues with Current Sleep Concerns Current Sleep Concerns      Comments Peter [name] does not have any major stressors.  He just lets things go and sloughs them off.   Looking back he realizes just what stress really was and now its  now where near as  bad. He does not have a good sleep routine and thus continues to wake several times at night. But he consistently gets 6-7 hours a night Peter [name] has been doing well in rehab and is opening up more. 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  He is still getting enough rest, but he continues to be up and down with his bladder at night.  His biggest stressor currently has been trying to finish his taxes from last year.  He continues to smoke and is not ready to quit yet.       Expected Outcomes Short: Continue to maintain postive attitude.  Long: Work on stress routine. Short: Continue to have a postive attitude.  Long: Continue to get good sleep and work on quitting smoking.          Psychosocial Discharge (Final Psychosocial Re-Evaluation):     Psychosocial Re-Evaluation - 12/09/16 0925      Psychosocial Re-Evaluation   Current issues with Current Sleep Concerns   Comments Kellin has been doing well in rehab and is opening up more. 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  He is still getting enough rest, but he continues to be up and down with his bladder at night.  His biggest stressor currently has been trying to finish his taxes from last year.  He continues to smoke and is not ready to quit yet.    Expected Outcomes Short: Continue to have a postive attitude.  Long: Continue to get good sleep and work on quitting smoking.       Vocational Rehabilitation: Provide vocational rehab assistance to qualifying candidates.   Vocational Rehab Evaluation & Intervention:   Education: Education Goals: Education classes will be provided on a variety of topics geared toward better understanding of heart health and risk factor modification. Participant will state understanding/return demonstration of topics presented as noted by education test scores.  Learning Barriers/Preferences:     Learning  Barriers/Preferences - 10/19/16 1347      Learning Barriers/Preferences   Learning Barriers None   Learning Preferences None      Education Topics: General Nutrition Guidelines/Fats and Fiber: -Group instruction provided by verbal, written material, models and posters to present the general guidelines for heart healthy nutrition. Gives an explanation and review of dietary fats and fiber.   Cardiac Rehab from 12/23/2016 in [facility] Cardiac and Pulmonary Rehab  Date  11/30/16  Educator  CR      Controlling Sodium/Reading Food Labels: -Group verbal and written material supporting the discussion of sodium use in heart healthy nutrition. Review and explanation with models, verbal and written materials for utilization of the food label.   Cardiac Rehab from 12/23/2016 in [facility] Cardiac and Pulmonary Rehab  Date  12/07/16  Educator  CR  Instruction Review Code  1- Verbalizes Understanding      Exercise Physiology & Risk Factors: - Group verbal and written instruction with models to review the exercise physiology of the cardiovascular system and associated critical values. Details cardiovascular disease risk factors and the goals associated with each risk factor.   Cardiac Rehab from 12/23/2016 in [facility] Cardiac and Pulmonary Rehab  Date  12/16/16  Educator  [facility]  Instruction Review Code  1- Verbalizes Understanding      Aerobic Exercise & Resistance Training: - Gives group verbal and written discussion on the health impact of inactivity.  On the components of aerobic and resistive training programs and the benefits of this training and how to safely progress through these programs.   Cardiac Rehab from 12/23/2016 in  [facility] Cardiac and Pulmonary Rehab  Date  12/21/16  Educator  [facility]  Instruction Review Code  1- Verbalizes Understanding      Flexibility, Balance, General Exercise Guidelines: - Provides group verbal and written instruction on the benefits of flexibility and balance training programs.  Provides general exercise guidelines with specific guidelines to those with heart or lung disease. Demonstration and skill practice provided.   Cardiac Rehab from 12/23/2016 in [facility] Cardiac and Pulmonary Rehab  Date  12/23/16  Educator  [facility]  Instruction Review Code  1- Verbalizes Understanding      Stress Management: - Provides group verbal and written instruction about the health risks of elevated stress, cause of high stress, and healthy ways to reduce stress.   Cardiac Rehab from 12/23/2016 in [facility] Cardiac and Pulmonary Rehab  Date  11/04/16  Educator  [facility]      Depression: - Provides group verbal and written instruction on the correlation between heart/lung disease and depressed mood, treatment options, and the stigmas associated with seeking treatment.   Cardiac Rehab from 12/23/2016 in [facility] Cardiac and Pulmonary Rehab  Date  12/02/16  Educator  [facility]  Instruction Review Code (retired)  2- meets Designer, fashion/clothing & Physiology of the Heart: - Group verbal and written instruction and models provide basic cardiac anatomy and physiology, with the coronary electrical and arterial systems. Review of: AMI, Angina, Valve disease, Heart Failure, Cardiac Arrhythmia, Pacemakers, and the ICD.   Cardiac Rehab from 12/23/2016 in [facility] Cardiac and Pulmonary Rehab  Date  11/02/16  Educator  CE      Cardiac Procedures: - Group verbal and written instruction to review commonly prescribed medications for heart disease. Reviews the medication, class of the drug, and side effects. Includes the steps to properly store meds and maintain the prescription regimen. (beta blockers and nitrates)   Cardiac Medications I: - Group verbal and written instruction to review commonly prescribed medications for heart disease. Reviews the medication, class of the drug, and side effects. Includes the steps to properly store meds and maintain the prescription regimen.   Cardiac Medications II: -Group verbal  and written instruction to review commonly prescribed medications for heart disease. Reviews the medication, class of the drug, and side effects. (all other drug classes)    Go Sex-Intimacy & Heart Disease, Get SMART - Goal Setting: - Group verbal and written instruction through game format to discuss heart disease and the return to sexual intimacy. Provides group verbal and written material to discuss and apply goal setting through the application of the S.M.A.R.T. Method.   Other Matters of the Heart: - Provides group verbal, written materials and models to describe Heart Failure, Angina, Valve Disease, Peripheral Artery Disease, and Diabetes in the realm of heart disease. Includes description of the disease process and treatment options available to the cardiac patient.   Cardiac Rehab from 12/23/2016 in [facility] Cardiac and Pulmonary Rehab  Date  11/02/16  Educator  CE      Exercise & Equipment Safety: - Individual verbal instruction and demonstration of equipment use and safety with use of the equipment.   Cardiac Rehab from 12/23/2016 in [facility] Cardiac and Pulmonary Rehab  Date  10/19/16  Educator  Sb      Infection Prevention: - Provides verbal and written material to  individual with discussion of infection control including proper hand washing and proper equipment cleaning during exercise session.   Cardiac Rehab from 12/23/2016 in [facility] Cardiac and Pulmonary Rehab  Date  10/19/16  Educator  Sb      Falls Prevention: - Provides verbal and written material to individual with discussion of falls prevention and safety.   Cardiac Rehab from 12/23/2016 in [facility] Cardiac and Pulmonary Rehab  Date  10/19/16  Educator  SB  Instruction Review Code (retired)  2- meets goals/outcomes      Diabetes: - Individual verbal and written instruction to review signs/symptoms of diabetes, desired ranges of glucose level fasting, after meals and with exercise. Acknowledge that pre and post exercise  glucose checks will be done for 3 sessions at entry of program.   Other: -Provides group and verbal instruction on various topics (see comments)    Knowledge Questionnaire Score:     Knowledge Questionnaire Score - 10/19/16 1347      Knowledge Questionnaire Score   Pre Score 23/28  Correct responses reviewed with Peter [name] today. He verbalized understanding of responses and had no questions today.      Core Components/Risk Factors/Patient Goals at Admission:     Personal Goals and Risk Factors at Admission - 10/19/16 1524      Core Components/Risk Factors/Patient Goals on Admission    Weight Management Yes;Weight Maintenance   Admit Weight 210 lb 14.4 oz (95.7 kg)   Goal Weight: Short Term 210 lb (95.3 kg)   Goal Weight: Long Term 210 lb (95.3 kg)   Expected Outcomes Short Term: Continue to assess and modify interventions until short term weight is achieved;Long Term: Adherence to nutrition and physical activity/exercise program aimed toward attainment of established weight goal;Weight Maintenance: Understanding of the daily nutrition guidelines, which includes 25-35% calories from fat, 7% or less cal from saturated fats, less than 233m cholesterol, less than 1.5gm of sodium, & 5 or more servings of fruits and vegetables daily   Intervention Assist the participant in steps to quit. Provide individualized education and counseling about committing to Tobacco Cessation, relapse prevention, and pharmacological support that can be provided by physician.;OAdvice worker assist with locating and accessing local/national Quit Smoking programs, and support quit date choice.   Expected Outcomes Short Term: Will demonstrate readiness to quit, by selecting a quit date.;Short Term: Will quit all tobacco product use, adhering to prevention of relapse plan.;Long Term: Complete abstinence from all tobacco products for at least 12 months from quit date.   Hypertension Yes   Intervention  Provide education on lifestyle modifcations including regular physical activity/exercise, weight management, moderate sodium restriction and increased consumption of fresh fruit, vegetables, and low fat dairy, alcohol moderation, and smoking cessation.;Monitor prescription use compliance.   Expected Outcomes Short Term: Continued assessment and intervention until BP is < 140/958mHG in hypertensive participants. < 130/8044mG in hypertensive participants with diabetes, heart failure or chronic kidney disease.;Long Term: Maintenance of blood pressure at goal levels.   Lipids Yes   Intervention Provide education and support for participant on nutrition & aerobic/resistive exercise along with prescribed medications to achieve LDL <81m6mDL >40mg6mExpected Outcomes Short Term: Participant states understanding of desired cholesterol values and is compliant with medications prescribed. Participant is following exercise prescription and nutrition guidelines.;Long Term: Cholesterol controlled with medications as prescribed, with individualized exercise RX and with personalized nutrition plan. Value goals: LDL < 81mg,2m > 40 mg.      Core Components/Risk Factors/Patient  Goals Review:      Goals and Risk Factor Review    Row Name 11/23/16 0839 12/09/16 0835           Core Components/Risk Factors/Patient Goals Review   Personal Goals Review Weight Management/Obesity;Tobacco Cessation;Hypertension;Lipids Weight Management/Obesity;Tobacco Cessation;Hypertension;Lipids      Review Lamberto's weight is down some since starting rehab.  He does not have much of an appetite and has lost some weight.  He is watching what he is eating and cut back on sugar and salt.  His blood pressures have been good.  He says that cutting back on salt and losing weight has helped.  He does not have any problems with his medications other than he misses doses on occasion.  He is still not ready to quit smoking. He has cut back  to half a pack a day.  He says that it is really the habit more than the need for nicotine.  He doesn't smoke around other people.  He knows that the only way to quit is to make the decision to quit himself. Johnwesley continues to do well in rehab.  His weight has stalled out between 204-208 lbs.  He has an appetite but chooses not to eat as he continues to watch his salt intake.    His blood pressures continue to do well.  No problems with his medications.  He is still not ready to quit smoking.  Most days he is still at a pack a day, but some days are a little less.         Expected Outcomes Short: Continue to work on weight loss.  Long: Continue to work on risk factor modification and consider quitting smoking.  Short: Continue to work on weight loss.  Long: Continue to work on risk factor modification and smoking reduction.         Core Components/Risk Factors/Patient Goals at Discharge (Final Review):      Goals and Risk Factor Review - 12/09/16 0835      Core Components/Risk Factors/Patient Goals Review   Personal Goals Review Weight Management/Obesity;Tobacco Cessation;Hypertension;Lipids   Review Damiano continues to do well in rehab.  His weight has stalled out between 204-208 lbs.  He has an appetite but chooses not to eat as he continues to watch his salt intake.    His blood pressures continue to do well.  No problems with his medications.  He is still not ready to quit smoking.  Most days he is still at a pack a day, but some days are a little less.      Expected Outcomes Short: Continue to work on weight loss.  Long: Continue to work on risk factor modification and smoking reduction.      ITP Comments:     ITP Comments    Row Name 10/19/16 1322 10/28/16 0636 11/09/16 0745 11/20/16 0908 11/25/16 8110   ITP Comments Medical review completed. Initial IPT created. Documentation of diagnosis can be found in [facility] 08/10/2016 encounter 30 day review. Continue with ITP unless directed changes  per Medical Director review     New start  Yehoshua came in this morning for class.  He said he was having a hard time sitting down and was very achy.  Over the weekend, he fell off the bottom step of a ladder.  We encouraged him to rest and seek medical care if symptoms get worse.  Called to check on status of return.  Check he had fallen off ladder.  The pain is moving down leg.  He is afraid it could be a blood clot.  I encouraged him to go see a doctor.  He hopes to try to return next Wednesday. 30 day review. Continue with ITP unless directed changes per Medical Director review    [place] Name 12/23/16 1054           ITP Comments 30 day review. Continue with ITP unless directed changes per Medical Director review.            Comments:

## 2016-12-23 NOTE — Progress Notes (Signed)
Daily Session Note  Patient Details  Name: Peter [name] MRN: [id number] Date of Birth: Aug 11, 1940 Referring Provider:     Cardiac Rehab from 10/19/2016 in [facility] Cardiac and Pulmonary Rehab  Referring Provider  [name]      Encounter Date: 12/23/2016  Check In:     Session Check In - 12/23/16 0750      Check-In   Location [facility]-Cardiac & Pulmonary Rehab   Staff Present Alberteen Sam, MA, ACSM RCEP, Exercise Physiologist;Krista Frederico Hamman, RN BSN;Jordayn Mink Flavia Shipper   Supervising physician immediately available to respond to emergencies See telemetry face sheet for immediately available ER MD   Medication changes reported     No   Fall or balance concerns reported    No   Warm-up and Cool-down Performed on first and last piece of equipment   Resistance Training Performed Yes   VAD Patient? No     Pain Assessment   Currently in Pain? No/denies   Multiple Pain Sites No         History  Smoking Status  . Current Every Day Smoker  . Packs/day: 0.50  . Years: 0.50  . Types: Cigarettes  Smokeless Tobacco  . Never Used    Comment: 11/23/16 Still not ready to quit smoking.    Goals Met:  Independence with exercise equipment Exercise tolerated well No report of cardiac concerns or symptoms Strength training completed today  Goals Unmet:  Not Applicable  Comments: Pt able to follow exercise prescription today without complaint.  Will continue to monitor for progression.      [place] Name 10/19/16 1406 12/23/16 0839       6 Minute Walk   Phase Initial Discharge    Distance 1200 feet 1365 feet    Distance % Change  - 13.8 %    Distance Feet Change  - 165 ft    Walk Time 6 minutes 6 minutes    # of Rest Breaks 0 0    MPH 2.27 2.59    METS 2.29 3.1    RPE 12 13    VO2 Peak 8.02 10.85    Symptoms Yes (comment) Yes (comment)    Comments hip pain 5/10 hip pain 5/10    Resting HR 87 bpm 75 bpm    Resting BP 148/64 130/58    Max Ex. HR 91  bpm 100 bpm    Max Ex. BP 140/80 196/84    2 Minute Post BP  - 156/80        Dr. Emily Filbert is Medical Director for [place] and [facility].

## 2016-12-25 ENCOUNTER — Encounter: Payer: PPO | Admitting: *Deleted

## 2016-12-25 DIAGNOSIS — I110 Hypertensive heart disease with heart failure: Secondary | ICD-10-CM | POA: Diagnosis not present

## 2016-12-25 DIAGNOSIS — Z955 Presence of coronary angioplasty implant and graft: Secondary | ICD-10-CM

## 2016-12-25 DIAGNOSIS — I5022 Chronic systolic (congestive) heart failure: Secondary | ICD-10-CM

## 2016-12-25 NOTE — Progress Notes (Signed)
Daily Session Note  Patient Details  Name: Peter [name] MRN: [id number] Date of Birth: November 28, 1940 Referring Provider:     Cardiac Rehab from 10/19/2016 in [facility] Cardiac and Pulmonary Rehab  Referring Provider  [name]      Encounter Date: 12/25/2016  Check In:     Session Check In - 12/25/16 0827      Check-In   Location [facility]-Cardiac & Pulmonary Rehab   Staff Present Alberteen Sam, MA, ACSM RCEP, Exercise Physiologist;Amanda Oletta Darter, BA, ACSM CEP, Exercise Physiologist;Meredith Sherryll Burger, RN BSN   Supervising physician immediately available to respond to emergencies See telemetry face sheet for immediately available ER MD   Medication changes reported     No   Fall or balance concerns reported    No   Tobacco Cessation No Change   Warm-up and Cool-down Performed on first and last piece of equipment   Resistance Training Performed Yes   VAD Patient? No     Pain Assessment   Currently in Pain? No/denies   Multiple Pain Sites No         History  Smoking Status  . Current Every Day Smoker  . Packs/day: 0.50  . Years: 0.50  . Types: Cigarettes  Smokeless Tobacco  . Never Used    Comment: 11/23/16 Still not ready to quit smoking.    Goals Met:  Independence with exercise equipment Exercise tolerated well No report of cardiac concerns or symptoms Strength training completed today  Goals Unmet:  Not Applicable  Comments: Pt able to follow exercise prescription today without complaint.  Will continue to monitor for progression.    Dr. Emily Filbert is Medical Director for [place] and [facility].

## 2016-12-30 DIAGNOSIS — I110 Hypertensive heart disease with heart failure: Secondary | ICD-10-CM | POA: Diagnosis not present

## 2016-12-30 DIAGNOSIS — I5022 Chronic systolic (congestive) heart failure: Secondary | ICD-10-CM

## 2016-12-30 NOTE — Progress Notes (Signed)
Daily Session Note  Patient Details  Name: Peter [name] MRN: [id number] Date of Birth: [birth year] Referring Provider:     Cardiac Rehab from 10/19/2016 in [facility] Cardiac and Pulmonary Rehab  Referring Provider  [name]      Encounter Date: 12/30/2016  Check In:     Session Check In - 12/30/16 0739      Check-In   Location [facility]-Cardiac & Pulmonary Rehab   Staff Present Alberteen Sam, MA, ACSM RCEP, Exercise Physiologist;Susanne Bice, RN, BSN, CCRP;Zosia Lucchese Flavia Shipper   Supervising physician immediately available to respond to emergencies See telemetry face sheet for immediately available ER MD   Medication changes reported     No   Fall or balance concerns reported    No   Warm-up and Cool-down Performed on first and last piece of equipment   Resistance Training Performed Yes   VAD Patient? No     Pain Assessment   Currently in Pain? No/denies   Multiple Pain Sites No         History  Smoking Status  . Current Every Day Smoker  . Packs/day: 0.50  . Years: 0.50  . Types: Cigarettes  Smokeless Tobacco  . Never Used    Comment: 11/23/16 Still not ready to quit smoking.    Goals Met:  Independence with exercise equipment Exercise tolerated well No report of cardiac concerns or symptoms Strength training completed today  Goals Unmet:  Not Applicable  Comments: Pt able to follow exercise prescription today without complaint.  Will continue to monitor for progression.   Dr. Emily Filbert is Medical Director for [place] and [facility].

## 2017-01-01 ENCOUNTER — Encounter: Payer: PPO | Admitting: *Deleted

## 2017-01-01 DIAGNOSIS — I110 Hypertensive heart disease with heart failure: Secondary | ICD-10-CM | POA: Diagnosis not present

## 2017-01-01 DIAGNOSIS — Z955 Presence of coronary angioplasty implant and graft: Secondary | ICD-10-CM

## 2017-01-01 DIAGNOSIS — I5022 Chronic systolic (congestive) heart failure: Secondary | ICD-10-CM

## 2017-01-01 NOTE — Progress Notes (Signed)
Daily Session Note  Patient Details  Name: Peter [name] MRN: [id number] Date of Birth: [birth year] Referring Provider:     Cardiac Rehab from 10/19/2016 in [facility] Cardiac and Pulmonary Rehab  Referring Provider  [name]      Encounter Date: 01/01/2017  Check In:     Session Check In - 01/01/17 0832      Check-In   Location [facility]-Cardiac & Pulmonary Rehab   Staff Present Alberteen Sam, MA, ACSM RCEP, Exercise Physiologist;Amanda Oletta Darter, BA, ACSM CEP, Exercise Physiologist;Carroll Enterkin, RN, BSN   Supervising physician immediately available to respond to emergencies See telemetry face sheet for immediately available ER MD   Medication changes reported     No   Fall or balance concerns reported    No   Warm-up and Cool-down Performed on first and last piece of equipment   Resistance Training Performed Yes   VAD Patient? No     Pain Assessment   Currently in Pain? No/denies   Multiple Pain Sites No         History  Smoking Status  . Current Every Day Smoker  . Packs/day: 0.50  . Years: 0.50  . Types: Cigarettes  Smokeless Tobacco  . Never Used    Comment: 11/23/16 Still not ready to quit smoking.    Goals Met:  Independence with exercise equipment Exercise tolerated well Personal goals reviewed No report of cardiac concerns or symptoms Strength training completed today  Goals Unmet:  Not Applicable  Comments: Pt able to follow exercise prescription today without complaint.  Will continue to monitor for progression. See ITP for goals review    Dr. Emily Filbert is Medical Director for [place] and [facility].

## 2017-01-04 ENCOUNTER — Encounter: Payer: PPO | Admitting: *Deleted

## 2017-01-04 DIAGNOSIS — I110 Hypertensive heart disease with heart failure: Secondary | ICD-10-CM | POA: Diagnosis not present

## 2017-01-04 DIAGNOSIS — Z955 Presence of coronary angioplasty implant and graft: Secondary | ICD-10-CM

## 2017-01-04 DIAGNOSIS — I5022 Chronic systolic (congestive) heart failure: Secondary | ICD-10-CM

## 2017-01-04 NOTE — Progress Notes (Signed)
Daily Session Note  Patient Details  Name: Peter [name] MRN: [id number] Date of Birth: [birth year] Referring Provider:     Cardiac Rehab from 10/19/2016 in [facility] Cardiac and Pulmonary Rehab  Referring Provider  [name]      Encounter Date: 01/04/2017  Check In:     Session Check In - 01/04/17 0801      Check-In   Location [facility]-Cardiac & Pulmonary Rehab   Staff Present Alberteen Sam, MA, ACSM RCEP, Exercise Physiologist;Carroll Enterkin, RN, Moises Blood, BS, ACSM CEP, Exercise Physiologist   Supervising physician immediately available to respond to emergencies See telemetry face sheet for immediately available ER MD   Medication changes reported     No   Fall or balance concerns reported    No   Tobacco Cessation No Change   Warm-up and Cool-down Performed on first and last piece of equipment   Resistance Training Performed Yes   VAD Patient? No     Pain Assessment   Currently in Pain? No/denies   Multiple Pain Sites No         History  Smoking Status  . Current Every Day Smoker  . Packs/day: 0.50  . Years: 0.50  . Types: Cigarettes  Smokeless Tobacco  . Never Used    Comment: 11/23/16 Still not ready to quit smoking.    Goals Met:  Independence with exercise equipment Exercise tolerated well No report of cardiac concerns or symptoms Strength training completed today  Goals Unmet:  Not Applicable  Comments: Pt able to follow exercise prescription today without complaint.  Will continue to monitor for progression.    Dr. Emily Filbert is Medical Director for [place] and [facility].

## 2017-01-06 DIAGNOSIS — I5022 Chronic systolic (congestive) heart failure: Secondary | ICD-10-CM

## 2017-01-06 DIAGNOSIS — I110 Hypertensive heart disease with heart failure: Secondary | ICD-10-CM | POA: Diagnosis not present

## 2017-01-06 NOTE — Patient Instructions (Signed)
Discharge Instructions  Patient Details  Name: Peter [name] MRN: [id number] Date of Birth: 1940-08-10 Referring Provider:  [name], MD   Number of Visits: 36/36  Reason for Discharge:  Patient reached a stable level of exercise. Patient independent in their exercise. Patient has met program and personal goals.  Smoking History:  History  Smoking Status  . Current Every Day Smoker  . Packs/day: 0.50  . Years: 0.50  . Types: Cigarettes  Smokeless Tobacco  . Never Used    Comment: 11/23/16 Still not ready to quit smoking.    Diagnosis:  Heart failure, chronic systolic (HCC)  Initial Exercise Prescription:     Initial Exercise Prescription - 10/19/16 1400      Date of Initial Exercise RX and Referring Provider   Date 10/19/16   Referring Provider [name]     Treadmill   MPH 1.7  3/3/3   Grade 0   Minutes 15   METs 2.3     Recumbant Bike   Level 1   RPM 60   Minutes 15   METs 2     REL-XR   Level 1   Speed 60   Minutes 15   METs 2.3     T5 Nustep   Level 1   SPM 80   Minutes 15   METs 2     Prescription Details   Frequency (times per week) 3   Duration Progress to 30 minutes of continuous aerobic without signs/symptoms of physical distress     Intensity   THRR 40-80% of Max Heartrate 109-132   Ratings of Perceived Exertion 11-13   Perceived Dyspnea 0-4     Resistance Training   Training Prescription Yes   Weight 3   Reps 10-15      Discharge Exercise Prescription (Final Exercise Prescription Changes):     Exercise Prescription Changes - 12/30/16 1500      Response to Exercise   Blood Pressure (Admit) 124/64   Blood Pressure (Exercise) 140/50   Blood Pressure (Exit) 126/64   Heart Rate (Admit) 81 bpm   Heart Rate (Exercise) 115 bpm   Heart Rate (Exit) 54 bpm   Rating of Perceived Exertion (Exercise) 12   Symptoms hip pain on treadmill   Duration Continue with 45 min of aerobic exercise without signs/symptoms of  physical distress.   Intensity THRR unchanged     Progression   Progression Continue to progress workloads to maintain intensity without signs/symptoms of physical distress.   Average METs 3.21     Resistance Training   Training Prescription Yes   Weight 4 lbs   Reps 10-15     Interval Training   Interval Training No     Treadmill   MPH 1.7   Grade 1   Minutes 15   METs 2.54     REL-XR   Level 7   Minutes 15   METs 4.9     T5 Nustep   Level 5   Minutes 15   METs 2.2     Home Exercise Plan   Plans to continue exercise at Home (comment)  walking with plans to join [employer] after graduation   Frequency Add 2 additional days to program exercise sessions.   Initial Home Exercises Provided 10/30/16      Functional Capacity:     6 Minute Walk    Row Name 10/19/16 1406 12/23/16 0839       6 Minute Walk   Phase Initial Discharge  Distance 1200 feet 1365 feet    Distance % Change  - 13.8 %    Distance Feet Change  - 165 ft    Walk Time 6 minutes 6 minutes    # of Rest Breaks 0 0    MPH 2.27 2.59    METS 2.29 3.1    RPE 12 13    VO2 Peak 8.02 10.85    Symptoms Yes (comment) Yes (comment)    Comments hip pain 5/10 hip pain 5/10    Resting HR 87 bpm 75 bpm    Resting BP 148/64 130/58    Max Ex. HR 91 bpm 100 bpm    Max Ex. BP 140/80 196/84    2 Minute Post BP  - 156/80       Quality of Life:     Quality of Life - 01/01/17 0909      Quality of Life Scores   Health/Function Pre 22.87 %   Health/Function Post 24.03 %   Health/Function % Change 5.07 %   Socioeconomic Pre 23.21 %   Socioeconomic Post 27.21 %   Socioeconomic % Change  17.23 %   Psych/Spiritual Pre 22.5 %   Psych/Spiritual Post 28.07 %   Psych/Spiritual % Change 24.76 %   Family Pre 26.5 %   Family Post 25.2 %   Family % Change -4.91 %   GLOBAL Pre 23.4 %   GLOBAL Post 25.69 %   GLOBAL % Change 9.79 %      Personal Goals: Goals established at orientation with interventions  provided to work toward goal.     Personal Goals and Risk Factors at Admission - 10/19/16 1524      Core Components/Risk Factors/Patient Goals on Admission    Weight Management Yes;Weight Maintenance   Admit Weight 210 lb 14.4 oz (95.7 kg)   Goal Weight: Short Term 210 lb (95.3 kg)   Goal Weight: Long Term 210 lb (95.3 kg)   Expected Outcomes Short Term: Continue to assess and modify interventions until short term weight is achieved;Long Term: Adherence to nutrition and physical activity/exercise program aimed toward attainment of established weight goal;Weight Maintenance: Understanding of the daily nutrition guidelines, which includes 25-35% calories from fat, 7% or less cal from saturated fats, less than '200mg'$  cholesterol, less than 1.5gm of sodium, & 5 or more servings of fruits and vegetables daily   Intervention Assist the participant in steps to quit. Provide individualized education and counseling about committing to Tobacco Cessation, relapse prevention, and pharmacological support that can be provided by physician.;Advice worker, assist with locating and accessing local/national Quit Smoking programs, and support quit date choice.   Expected Outcomes Short Term: Will demonstrate readiness to quit, by selecting a quit date.;Short Term: Will quit all tobacco product use, adhering to prevention of relapse plan.;Long Term: Complete abstinence from all tobacco products for at least 12 months from quit date.   Hypertension Yes   Intervention Provide education on lifestyle modifcations including regular physical activity/exercise, weight management, moderate sodium restriction and increased consumption of fresh fruit, vegetables, and low fat dairy, alcohol moderation, and smoking cessation.;Monitor prescription use compliance.   Expected Outcomes Short Term: Continued assessment and intervention until BP is < 140/71m HG in hypertensive participants. < 130/862mHG in hypertensive  participants with diabetes, heart failure or chronic kidney disease.;Long Term: Maintenance of blood pressure at goal levels.   Lipids Yes   Intervention Provide education and support for participant on nutrition & aerobic/resistive exercise along with  prescribed medications to achieve LDL '70mg'$ , HDL >'40mg'$ .   Expected Outcomes Short Term: Participant states understanding of desired cholesterol values and is compliant with medications prescribed. Participant is following exercise prescription and nutrition guidelines.;Long Term: Cholesterol controlled with medications as prescribed, with individualized exercise RX and with personalized nutrition plan. Value goals: LDL < '70mg'$ , HDL > 40 mg.       Personal Goals Discharge:     Goals and Risk Factor Review - 01/01/17 0833      Core Components/Risk Factors/Patient Goals Review   Personal Goals Review Weight Management/Obesity;Tobacco Cessation;Hypertension;Lipids   Review Benicio is feeling good overall.  His weight has been stable and he continues to lose.  Blood pressures have been good and his medications seem to working for him.   Expected Outcomes Short: Graduate  Long: Continue to work on risk factors modfications.       Exercise Goals and Review:     Exercise Goals    Row Name 10/19/16 1408             Exercise Goals   Increase Physical Activity Yes       Intervention Provide advice, education, support and counseling about physical activity/exercise needs.;Develop an individualized exercise prescription for aerobic and resistive training based on initial evaluation findings, risk stratification, comorbidities and participant's personal goals.       Expected Outcomes Achievement of increased cardiorespiratory fitness and enhanced flexibility, muscular endurance and strength shown through measurements of functional capacity and personal statement of participant.       Increase Strength and Stamina Yes       Intervention Provide  advice, education, support and counseling about physical activity/exercise needs.;Develop an individualized exercise prescription for aerobic and resistive training based on initial evaluation findings, risk stratification, comorbidities and participant's personal goals.       Expected Outcomes Achievement of increased cardiorespiratory fitness and enhanced flexibility, muscular endurance and strength shown through measurements of functional capacity and personal statement of participant.          Nutrition & Weight - Outcomes:     Pre Biometrics - 10/28/16 1501      Pre Biometrics   Waist Circumference 42.5 inches   Hip Circumference 43.5 inches   Waist to Hip Ratio 0.98 %   Single Leg Stand 1.51 seconds         Post Biometrics - 12/23/16 0844       Post  Biometrics   Height 5' 10.75" (1.797 m)   Weight 209 lb 14.4 oz (95.2 kg)   Waist Circumference 44 inches   Hip Circumference 43.5 inches   Waist to Hip Ratio 1.01 %   BMI (Calculated) 29.48   Single Leg Stand 2.21 seconds      Nutrition:     Nutrition Therapy & Goals - 11/23/16 1316      Nutrition Therapy   Diet Instructed on a meal plan based on heart healthy dietary guidelines   Drug/Food Interactions Statins/Certain Fruits   Protein (specify units) 7   Fiber 25 grams   Whole Grain Foods 3 servings   Saturated Fats 12 max. grams   Fruits and Vegetables 5 servings/day   Sodium 1500 grams     Personal Nutrition Goals   Nutrition Goal Try some low sodium products such as [place] low sodium sauces, [employer] very low sodium tuna, Mrs. DASH lemon pepper and taco seasoning, Nathan french fries   Personal Goal #2 Add low sodium sides such as steamed vegetables and fruits  to entrees that have more sodium.    Personal Goal #3 Read labels for saturated fat, trans fat and sodium.   Comments Patient eats out frequently but was interested in simple lower sodium meals that can be prepared at home as well as lower sodium  choices when eating "out". Discussed this at length.     Intervention Plan   Intervention Prescribe, educate and counsel regarding individualized specific dietary modifications aiming towards targeted core components such as weight, hypertension, lipid management, diabetes, heart failure and other comorbidities.;Nutrition handout(s) given to patient.   Expected Outcomes Short Term Goal: Understand basic principles of dietary content, such as calories, fat, sodium, cholesterol and nutrients.;Short Term Goal: A plan has been developed with personal nutrition goals set during dietitian appointment.;Long Term Goal: Adherence to prescribed nutrition plan.      Nutrition Discharge:     Nutrition Assessments - 01/01/17 0909      MEDFICTS Scores   Pre Score 61   Post Score 37   Score Difference -24      Education Questionnaire Score:     Knowledge Questionnaire Score - 01/01/17 0909      Knowledge Questionnaire Score   Pre Score 23/28   Post Score 27/28      Goals reviewed with patient; copy given to patient.

## 2017-01-06 NOTE — Progress Notes (Deleted)
Pulmonary Individual Treatment Plan  Patient Details  Name: Peter [name] MRN: [id number] Date of Birth: December 22, 1940 Referring Provider:     Cardiac Rehab from 10/19/2016 in [facility] Cardiac and Pulmonary Rehab  Referring Provider  [name]      Initial Encounter Date:    Cardiac Rehab from 10/19/2016 in [facility] Cardiac and Pulmonary Rehab  Date  10/19/16  Referring Provider  [name]      Visit Diagnosis: Heart failure, chronic systolic ([place])  Patient's Home Medications on Admission:  Current Outpatient Prescriptions:  .  amLODipine-benazepril (LOTREL) 10-40 MG capsule, TAKE 1 CAPSULE BY MOUTH EVERY DAY, Disp: , Rfl:  .  aspirin 325 MG tablet, Take 325 mg by mouth 2 (two) times daily. Patient takes if there is any pain or burning in the chest., Disp: , Rfl:  .  atorvastatin (LIPITOR) 40 MG tablet, Take 1 tablet (40 mg total) by mouth daily at 6 PM., Disp: 30 tablet, Rfl: 0 .  carvedilol (COREG) 3.125 MG tablet, Take 3.125 mg by mouth 2 (two) times daily with a meal., Disp: , Rfl:  .  cetirizine (WAL-ZYR) 10 MG tablet, Take 10 mg by mouth., Disp: , Rfl:  .  clobetasol cream (TEMOVATE) 0.05 %, APPLY TO PSORIASIS BID PRN UNTIL SMOOTH, Disp: , Rfl: 1 .  clopidogrel (PLAVIX) 75 MG tablet, TAKE 1 TABLET(75 MG) BY MOUTH EVERY DAY, Disp: 30 tablet, Rfl: 1 .  cyanocobalamin 1000 MCG tablet, Take 1,000 mcg by mouth daily. , Disp: , Rfl:  .  furosemide (LASIX) 20 MG tablet, Take 1 tablet (20 mg total) by mouth daily. (Patient taking differently: Take 20 mg by mouth 2 (two) times daily. ), Disp: 30 tablet, Rfl: 1 .  GLUCOSAMINE-CHONDROITIN-VIT C PO, Take 1 tablet by mouth daily., Disp: , Rfl:  .  hydrochlorothiazide (HYDRODIURIL) 12.5 MG tablet, Take 12.5 mg by mouth daily. , Disp: , Rfl:  .  isosorbide mononitrate (IMDUR) 30 MG 24 hr tablet, Take 30 mg by mouth daily., Disp: , Rfl:  .  omeprazole (PRILOSEC) 20 MG capsule, Take by mouth., Disp: , Rfl:  .  spironolactone (ALDACTONE) 25 MG tablet,  Take 0.5 tablets by mouth daily., Disp: , Rfl:  .  tamsulosin (FLOMAX) 0.4 MG CAPS capsule, TAKE 1 CAPSULE BY MOUTH EVERY DAY, Disp: , Rfl:   Past Medical History: Past Medical History:  Diagnosis Date  . Anginal pain ([place])   . Asthma   . Bladder infection, acute 03/2011   "had a whole lot of bleeding from this"  . CHF (congestive heart failure) ([place])   . Coronary artery disease   . High cholesterol   . Hypertension   . 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 Myocardial infarction ([place]) 1996  . NSTEMI (non-ST elevated myocardial infarction) ([place]) 12/14/2011  . Shortness of breath 12/14/2011   "@ rest, lying down, w/exertion"    Tobacco Use: History  Smoking Status  . Current Every Day Smoker  . Packs/day: 0.50  . Years: 0.50  . Types: Cigarettes  Smokeless Tobacco  . Never Used    Comment: 11/23/16 Still not ready to quit smoking.    Labs: Recent Review Flowsheet Data    Labs for ITP Cardiac and Pulmonary Rehab Latest Ref Rng & Units 12/16/2011 08/08/2016   Cholestrol 0 - 200 mg/dL 141 108   LDLCALC 0 - 99 mg/dL 83 50   HDL >40 mg/dL 38(L) 36(L)   Trlycerides <  150 mg/dL 102 111       Pulmonary Assessment Scores:   Pulmonary Function Assessment:   Exercise Target Goals:    Exercise Program Goal: Individual exercise prescription set with THRR, safety & activity barriers. Participant demonstrates ability to understand and report RPE using BORG scale, to self-measure pulse accurately, and to acknowledge the importance of the exercise prescription.  Exercise Prescription Goal: Starting with aerobic activity 30 plus minutes a day, 3 days per week for initial exercise prescription. Provide home exercise prescription and guidelines that participant acknowledges understanding prior to discharge.  Activity Barriers & Risk Stratification:   6 Minute Walk:     6 Minute Walk    Row Name 10/19/16 1406 12/23/16 0839       6  Minute Walk   Phase Initial Discharge    Distance 1200 feet 1365 feet    Distance % Change  - 13.8 %    Distance Feet Change  - 165 ft    Walk Time 6 minutes 6 minutes    # of Rest Breaks 0 0    MPH 2.27 2.59    METS 2.29 3.1    RPE 12 13    VO2 Peak 8.02 10.85    Symptoms Yes (comment) Yes (comment)    Comments hip pain 5/10 hip pain 5/10    Resting HR 87 bpm 75 bpm    Resting BP 148/64 130/58    Max Ex. HR 91 bpm 100 bpm    Max Ex. BP 140/80 196/84    2 Minute Post BP  - 156/80      Oxygen Initial Assessment:   Oxygen Re-Evaluation:   Oxygen Discharge (Final Oxygen Re-Evaluation):   Initial Exercise Prescription:     Initial Exercise Prescription - 10/19/16 1400      Date of Initial Exercise RX and Referring Provider   Date 10/19/16   Referring Provider [name]     Treadmill   MPH 1.7  3/3/3   Grade 0   Minutes 15   METs 2.3     Recumbant Bike   Level 1   RPM 60   Minutes 15   METs 2     REL-XR   Level 1   Speed 60   Minutes 15   METs 2.3     T5 Nustep   Level 1   SPM 80   Minutes 15   METs 2     Prescription Details   Frequency (times per week) 3   Duration Progress to 30 minutes of continuous aerobic without signs/symptoms of physical distress     Intensity   THRR 40-80% of Max Heartrate 109-132   Ratings of Perceived Exertion 11-13   Perceived Dyspnea 0-4     Resistance Training   Training Prescription Yes   Weight 3   Reps 10-15      Perform Capillary Blood Glucose checks as needed.  Exercise Prescription Changes:     Exercise Prescription Changes    Row Name 10/19/16 1300 10/30/16 0900 11/05/16 1100 12/01/16 1600 12/16/16 1500     Response to Exercise   Blood Pressure (Admit) 148/64  - 118/50 140/62 124/64   Blood Pressure (Exercise) 140/80  - 140/66 146/64 178/70   Blood Pressure (Exit)  -  - 138/56 134/74 122/52   Heart Rate (Admit) 80 bpm  - 79 bpm 88 bpm 77 bpm   Heart Rate (Exercise)  -  - 103 bpm 109 bpm 94 bpm  Heart Rate (Exit) 83 bpm  - 58 bpm 67 bpm 65 bpm   Rating of Perceived Exertion (Exercise) 12  - _0 Symptoms  -  - none none hip pain on treadmill   Duration  -  - Continue with 45 min of aerobic exercise without signs/symptoms of physical distress. Continue with 45 min of aerobic exercise without signs/symptoms of physical distress. Continue with 45 min of aerobic exercise without signs/symptoms of physical distress.   Intensity  -  - THRR unchanged THRR unchanged THRR unchanged     Progression   Progression  -  - Continue to progress workloads to maintain intensity without signs/symptoms of physical distress. Continue to progress workloads to maintain intensity without signs/symptoms of physical distress. Continue to progress workloads to maintain intensity without signs/symptoms of physical distress.   Average METs  -  - 3.07 3.15 3.12     Resistance Training   Training Prescription  -  - Yes Yes Yes   Weight  -  - 3 lbs 3 lbs 3 lbs   Reps  -  - 10-15 10-15 10-15     Interval Training   Interval Training  -  - No No No     Treadmill   MPH  -  - 1.7 1.7 1.7   Grade  -  - 0 0.5 0.5   Minutes  -  - _1 METs  -  - 2.3 2.54 2.42     REL-XR   Level  -  - _2 Minutes  -  - _3 METs  -  - 4.9 4.7 4.8     T5 Nustep   Level  -  - _4 Minutes  -  - _5 METs  -  - 2 2.2 2.1     Home Exercise Plan   Plans to continue exercise at  - [employer] (comment)  walking with plans to join [employer] after graduation Home (comment)  walking with plans to join [employer] after graduation   Frequency  - Add 1 additional day to program exercise sessions. Add 1 additional day to program exercise sessions. Add 1 additional day to program exercise sessions. Add 1 additional day to program exercise sessions.   Initial Home Exercises Provided  - 10/30/16 10/30/16 10/30/16 10/30/16   Row Name 12/30/16 1500             Response to Exercise    Blood Pressure (Admit) 124/64       Blood Pressure (Exercise) 140/50       Blood Pressure (Exit) 126/64       Heart Rate (Admit) 81 bpm       Heart Rate (Exercise) 115 bpm       Heart Rate (Exit) 54 bpm       Rating of Perceived Exertion (Exercise) 12       Symptoms hip pain on treadmill       Duration Continue with 45 min of aerobic exercise without signs/symptoms of physical distress.       Intensity THRR unchanged         Progression   Progression Continue to progress workloads to maintain intensity without signs/symptoms of physical distress.       Average METs 3.21         Resistance Training   Training Prescription Yes  Weight 4 lbs       Reps 10-15         Interval Training   Interval Training No         Treadmill   MPH 1.7       Grade 1       Minutes 15       METs 2.54         REL-XR   Level 7       Minutes 15       METs 4.9         T5 Nustep   Level 5       Minutes 15       METs 2.2         Home Exercise Plan   Plans to continue exercise at Home (comment)  walking with plans to join [employer] after graduation       Frequency Add 2 additional days to program exercise sessions.       Initial Home Exercises Provided 10/30/16          Exercise Comments:     Exercise Comments    Row Name 10/26/16 6789 01/01/17 0834         Exercise Comments First full day of exercise!  Patient was oriented to gym and equipment including functions, settings, policies, and procedures.  Patient's individual exercise prescription and treatment plan were reviewed.  All starting workloads were established based on the results of the 6 minute walk test done at initial orientation visit.  The plan for exercise progression was also introduced and progression will be customized based on patient's performance and goals. Harless had some chest pressure on the treadmill today.  He felt that it was mostly gas and and he was able to continue to exercise on the seated equipment  without a problem.         Exercise Goals and Review:     Exercise Goals    Row Name 10/19/16 1408             Exercise Goals   Increase Physical Activity Yes       Intervention Provide advice, education, support and counseling about physical activity/exercise needs.;Develop an individualized exercise prescription for aerobic and resistive training based on initial evaluation findings, risk stratification, comorbidities and participant's personal goals.       Expected Outcomes Achievement of increased cardiorespiratory fitness and enhanced flexibility, muscular endurance and strength shown through measurements of functional capacity and personal statement of participant.       Increase Strength and Stamina Yes       Intervention Provide advice, education, support and counseling about physical activity/exercise needs.;Develop an individualized exercise prescription for aerobic and resistive training based on initial evaluation findings, risk stratification, comorbidities and participant's personal goals.       Expected Outcomes Achievement of increased cardiorespiratory fitness and enhanced flexibility, muscular endurance and strength shown through measurements of functional capacity and personal statement of participant.          Exercise Goals Re-Evaluation :     Exercise Goals Re-Evaluation    Row Name 10/30/16 0901 11/05/16 1133 11/23/16 0849 12/01/16 1618 12/09/16 0833     Exercise Goal Re-Evaluation   Exercise Goals Review Increase Physical Activity;Increase Strenth and Stamina Increase Physical Activity;Increase Strenth and Stamina Increase Physical Activity;Increase Strenth and Stamina Increase Physical Activity;Increase Strenth and Stamina Increase Physical Activity;Increase Strength and Stamina;Understanding of Exercise Prescription   Comments Drayke has not been exercising  outside class.  He does not want to go to a gym and is considering [employer].  He states he can feel  a difference in his first week of exercise. Jawaun is doing well in rehab.  He is up to level 5 on the NuStep and walking on the treadmill is his most difficult piece.  We will continue to work with him on progression. Carmel has been walking more at home.  He has found it helpful in trying to lose weight.  He has noticed that his strength and stamina has improved since starting. He was surprised at how well he was able to return to exercise despite having been out for two week.s. Jayceon continues to do well in rehab.  He is getting stronger and able to do more at home. He still struggles with walking on the treadmill due to hip pain, but he has been able to increase other workloads.  We will continue to monitor his progression. Vlad has been doing well in rehab.  He was able to add some incline to the treadmill today!!  He is still only walking some at home due to his hip pain.  However, he does feel that his strength and stamina are getting better and he is able to do more around the house..   Expected Outcomes Short - Bereket will ad 1 day to his exercise routine.  Long - Frederico will exercise 3-5 days a wekk on a regular basis. Short: Jason will try to add in some exercise at home.  Long: Continue to make exercise part of routine. Short: Continue to walk at home.  Long: Contintue to make exercise part of routine.  Short: Continue to try to walk more at home.  Long: Continue to make exercise part of routine.  Short: Try to walk more at home.  Long: Aim to get in some exercise everyday.    [place] Name 12/16/16 1536 12/23/16 0840 12/30/16 1516         Exercise Goal Re-Evaluation   Exercise Goals Review Increase Physical Activity;Increase Strength and Stamina Increase Physical Activity;Increase Strength and Stamina Increase Physical Activity;Increase Strength and Stamina     Comments Lanny contineus to do well in rehab.  He is up to level 7 on the XR.  We will continue to monitor his progression.  Improved walk test by 165 ft!!! Yvone Neu will be graduating soon!  He did well on his walk test.  He has added a 1% grade to his treadmill.  We will continue to monitor his progress until graduation.      Expected Outcomes Short: Continue to try to walk as much as possible.  Long: Make exercise part of routine.  Short: [employer].  Long: Continue to walk on own. Short: [employer].  Long: Continue to walk on own.        Discharge Exercise Prescription (Final Exercise Prescription Changes):     Exercise Prescription Changes - 12/30/16 1500      Response to Exercise   Blood Pressure (Admit) 124/64   Blood Pressure (Exercise) 140/50   Blood Pressure (Exit) 126/64   Heart Rate (Admit) 81 bpm   Heart Rate (Exercise) 115 bpm   Heart Rate (Exit) 54 bpm   Rating of Perceived Exertion (Exercise) 12   Symptoms hip pain on treadmill   Duration Continue with 45 min of aerobic exercise without signs/symptoms of physical distress.   Intensity THRR unchanged     Progression   Progression Continue to progress  workloads to maintain intensity without signs/symptoms of physical distress.   Average METs 3.21     Resistance Training   Training Prescription Yes   Weight 4 lbs   Reps 10-15     Interval Training   Interval Training No     Treadmill   MPH 1.7   Grade 1   Minutes 15   METs 2.54     REL-XR   Level 7   Minutes 15   METs 4.9     T5 Nustep   Level 5   Minutes 15   METs 2.2     Home Exercise Plan   Plans to continue exercise at Home (comment)  walking with plans to join [employer] after graduation   Frequency Add 2 additional days to program exercise sessions.   Initial Home Exercises Provided 10/30/16      Nutrition:  Target Goals: Understanding of nutrition guidelines, daily intake of sodium <1542m, cholesterol <2028m calories 30% from fat and 7% or less from saturated fats, daily to have 5 or more servings of fruits and vegetables.  Biometrics:     Pre Biometrics  - 10/28/16 1501      Pre Biometrics   Waist Circumference 42.5 inches   Hip Circumference 43.5 inches   Waist to Hip Ratio 0.98 %   Single Leg Stand 1.51 seconds         Post Biometrics - 12/23/16 0844       Post  Biometrics   Height 5' 10.75" (1.797 m)   Weight 209 lb 14.4 oz (95.2 kg)   Waist Circumference 44 inches   Hip Circumference 43.5 inches   Waist to Hip Ratio 1.01 %   BMI (Calculated) 29.48   Single Leg Stand 2.21 seconds      Nutrition Therapy Plan and Nutrition Goals:     Nutrition Therapy & Goals - 11/23/16 1316      Nutrition Therapy   Diet Instructed on a meal plan based on heart healthy dietary guidelines   Drug/Food Interactions Statins/Certain Fruits   Protein (specify units) 7   Fiber 25 grams   Whole Grain Foods 3 servings   Saturated Fats 12 max. grams   Fruits and Vegetables 5 servings/day   Sodium 1500 grams     Personal Nutrition Goals   Nutrition Goal Try some low sodium products such as Vi[place]ow sodium sauces, StarKist very low sodium tuna, Mrs. DASH lemon pepper and taco seasoning, Nathan french fries   Personal Goal #2 Add low sodium sides such as steamed vegetables and fruits to entrees that have more sodium.    Personal Goal #3 Read labels for saturated fat, trans fat and sodium.   Comments Patient eats out frequently but was interested in simple lower sodium meals that can be prepared at home as well as lower sodium choices when eating "out". Discussed this at length.     Intervention Plan   Intervention Prescribe, educate and counsel regarding individualized specific dietary modifications aiming towards targeted core components such as weight, hypertension, lipid management, diabetes, heart failure and other comorbidities.;Nutrition handout(s) given to patient.   Expected Outcomes Short Term Goal: Understand basic principles of dietary content, such as calories, fat, sodium, cholesterol and nutrients.;Short Term Goal: A plan has been  developed with personal nutrition goals set during dietitian appointment.;Long Term Goal: Adherence to prescribed nutrition plan.      Nutrition Discharge: Rate Your Plate Scores:     Nutrition Assessments - 01/01/17 099935  MEDFICTS Scores   Pre Score 61   Post Score 37   Score Difference -24      Nutrition Goals Re-Evaluation:     Nutrition Goals Re-Evaluation    [place] Name 11/23/16 0852 12/09/16 0841           Goals   Current Weight 209 lb (94.8 kg) 212 lb (96.2 kg)      Nutrition Goal  - Try some low sodium products such as [place] low sodium sauces, StarKist very low sodium tuna, Mrs. DASH lemon pepper and taco seasoning, Nathan french fries; Read labels, steam vegetables and fruits      Comment has appt to meet with dietician today Filiberto has really started watching his sodium intake very closely.  He uses Mrs. Dash routinely.  He also has really focused on reading his food labels.  However, he does not choose to eat frequently.       Expected Outcome work on poor appetite Short: Try to eat more frequently.  Long: Continue to work on weight loss and sodium intake.          Nutrition Goals Discharge (Final Nutrition Goals Re-Evaluation):     Nutrition Goals Re-Evaluation - 12/09/16 0841      Goals   Current Weight 212 lb (96.2 kg)   Nutrition Goal Try some low sodium products such as [place] low sodium sauces, StarKist very low sodium tuna, Mrs. DASH lemon pepper and taco seasoning, Nathan french fries; Read labels, steam vegetables and fruits   Comment Quindell has really started watching his sodium intake very closely.  He uses Mrs. Dash routinely.  He also has really focused on reading his food labels.  However, he does not choose to eat frequently.    Expected Outcome Short: Try to eat more frequently.  Long: Continue to work on weight loss and sodium intake.       Psychosocial: Target Goals: Acknowledge presence or absence of significant depression and/or  stress, maximize coping skills, provide positive support system. Participant is able to verbalize types and ability to use techniques and skills needed for reducing stress and depression.   Initial Review & Psychosocial Screening:     Initial Psych Review & Screening - 10/19/16 1344      Initial Review   Current issues with None Identified     Family Dynamics   Good Support System? Yes  Good friend Nelida Meuse     Barriers   Psychosocial barriers to participate in program There are no identifiable barriers or psychosocial needs.;The patient should benefit from training in stress management and relaxation.     Screening Interventions   Interventions Encouraged to exercise;Provide feedback about the scores to participant;To provide support and resources with identified psychosocial needs      Quality of Life Scores:     Quality of Life - 01/01/17 0909      Quality of Life Scores   Health/Function Pre 22.87 %   Health/Function Post 24.03 %   Health/Function % Change 5.07 %   Socioeconomic Pre 23.21 %   Socioeconomic Post 27.21 %   Socioeconomic % Change  17.23 %   Psych/Spiritual Pre 22.5 %   Psych/Spiritual Post 28.07 %   Psych/Spiritual % Change 24.76 %   Family Pre 26.5 %   Family Post 25.2 %   Family % Change -4.91 %   GLOBAL Pre 23.4 %   GLOBAL Post 25.69 %   GLOBAL % Change 9.79 %  PHQ-9: Recent Review Flowsheet Data    Depression screen [facility] 2/9 01/01/2017 12/18/2016 10/19/2016 09/15/2016 08/18/2016   Decreased Interest 0 0 0 0 0   Down, Depressed, Hopeless 0 0 0 0 0   PHQ - 2 Score 0 0 0 0 0   Altered sleeping 0 - 0 - -   Tired, decreased energy 0 - 1 - -   Change in appetite 0 - 0 - -   Feeling bad or failure about yourself  0 - 0 - -   Trouble concentrating 0 - 0 - -   Moving slowly or fidgety/restless 0 - 0 - -   Suicidal thoughts 0 - 0 - -   PHQ-9 Score 0 - 1 - -   Difficult doing work/chores Not difficult at all - Not difficult at all - -      Interpretation of Total Score  Total Score Depression Severity:  1-4 = Minimal depression, 5-9 = Mild depression, 10-14 = Moderate depression, 15-19 = Moderately severe depression, 20-27 = Severe depression   Psychosocial Evaluation and Intervention:     Psychosocial Evaluation - 11/23/16 0931      Psychosocial Evaluation & Interventions   Interventions Encouraged to exercise with the program and follow exercise prescription   Comments Counselor met with Mr. Riggenbach Garofano) today for initial psychosocial evaluation.  He is a 76 year old who had a stent blockage recently.  He also had a recent fall off a step ladder that took him out of this program for several weeks.  Sheena has a strong support system with a significant other; daughter in [place]; Brother in [place]; and a strong men's support group that he actively participates in.  Adal has minimal health issues and although he doesn't sleep well at night in general, he reports getting about 6 hours in a 24 hour period most days.  His appetite has decreased since the recent cardiac event.  He denies a history of depression or anxiety or any currrent symptoms.  He is typically in a positive mood and has minimal stress reported in his life, other than his health.  Taris has goals to improve his health and increase his stamina and strength.  Staff will follow him throughout the course of this program.     Expected Outcomes Lafe will benefit from consistent exercise to achieve his stated goals.  He is scheduled to meet with the dietician to address his sodium restrictions.  The educational components of this program will also be helpful for Natasha to learn ways to improve his health and manage his disease.     Continue Psychosocial Services  Follow up required by staff      Psychosocial Re-Evaluation:     Psychosocial Re-Evaluation    Row Name 11/23/16 0855 12/09/16 0925           Psychosocial Re-Evaluation   Current  issues with Current Sleep Concerns Current Sleep Concerns      Comments Corbyn does not have any major stressors.  He just lets things go and sloughs them off.   Looking back he realizes just what stress really was and now its now where near as bad. He does not have a good sleep routine and thus continues to wake several times at night. But he consistently gets 6-7 hours a night Cesare has been doing well in rehab and is opening up more. 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  He is still getting enough rest, but he continues to be up and down with his bladder at night.  His biggest stressor currently has been trying to finish his taxes from last year.  He continues to smoke and is not ready to quit yet.       Expected Outcomes Short: Continue to maintain postive attitude.  Long: Work on stress routine. Short: Continue to have a postive attitude.  Long: Continue to get good sleep and work on quitting smoking.          Psychosocial Discharge (Final Psychosocial Re-Evaluation):     Psychosocial Re-Evaluation - 12/09/16 0925      Psychosocial Re-Evaluation   Current issues with Current Sleep Concerns   Comments Rontae has been doing well in rehab and is opening up more. 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  He is still getting enough rest, but he continues to be up and down with his bladder at night.  His biggest stressor currently has been trying to finish his taxes from last year.  He continues to smoke and is not ready to quit yet.    Expected Outcomes Short: Continue to have a postive attitude.  Long: Continue to get good sleep and work on quitting smoking.       Education: Education Goals: Education classes will be provided on a weekly basis, covering required topics. Participant will state understanding/return demonstration of topics presented.  Learning Barriers/Preferences:     Learning  Barriers/Preferences - 10/19/16 1347      Learning Barriers/Preferences   Learning Barriers None   Learning Preferences None      Education Topics: Initial Evaluation Education: - Verbal, written and demonstration of respiratory meds, RPE/PD scales, oximetry and breathing techniques. Instruction on use of nebulizers and MDIs: cleaning and proper use, rinsing mouth with steroid doses and importance of monitoring MDI activations.   General Nutrition Guidelines/Fats and Fiber: -Group instruction provided by verbal, written material, models and posters to present the general guidelines for heart healthy nutrition. Gives an explanation and review of dietary fats and fiber.   Cardiac Rehab from 01/04/2017 in [facility] Cardiac and Pulmonary Rehab  Date  11/30/16  Educator  CR  Instruction Review Code  1- Verbalizes Understanding      Controlling Sodium/Reading Food Labels: -Group verbal and written material supporting the discussion of sodium use in heart healthy nutrition. Review and explanation with models, verbal and written materials for utilization of the food label.   Cardiac Rehab from 01/04/2017 in [facility] Cardiac and Pulmonary Rehab  Date  12/07/16  Educator  CR  Instruction Review Code  1- Verbalizes Understanding      Exercise Physiology & Risk Factors: - Group verbal and written instruction with models to review the exercise physiology of the cardiovascular system and associated critical values. Details cardiovascular disease risk factors and the goals associated with each risk factor.   Cardiac Rehab from 01/04/2017 in [facility] Cardiac and Pulmonary Rehab  Date  12/16/16  Educator  [facility]  Instruction Review Code  1- Verbalizes Understanding      Aerobic Exercise & Resistance Training: - Gives group verbal and written discussion on the health impact of inactivity. On the components of aerobic and resistive training programs and the benefits of this training and how to safely progress  through these programs.   Cardiac Rehab from 01/04/2017 in [facility] Cardiac and Pulmonary Rehab  Date  12/21/16  Educator  [facility]  Instruction Review Code  1- Geologist, engineering, Balance, General Exercise Guidelines: - Provides group verbal and written instruction on the benefits of flexibility and balance training programs. Provides general exercise guidelines with specific guidelines to those with heart or lung disease. Demonstration and skill practice provided.   Cardiac Rehab from 01/04/2017 in [facility] Cardiac and Pulmonary Rehab  Date  12/23/16  Educator  [facility]  Instruction Review Code  1- Verbalizes Understanding      Stress Management: - Provides group verbal and written instruction about the health risks of elevated stress, cause of high stress, and healthy ways to reduce stress.   Cardiac Rehab from 01/04/2017 in [facility] Cardiac and Pulmonary Rehab  Date  12/30/16  Educator  [facility]  Instruction Review Code  1- Verbalizes Understanding      Depression: - Provides group verbal and written instruction on the correlation between heart/lung disease and depressed mood, treatment options, and the stigmas associated with seeking treatment.   Cardiac Rehab from 01/04/2017 in [facility] Cardiac and Pulmonary Rehab  Date  12/02/16  Educator  [facility]  Instruction Review Code (retired)  2- meets goals/outcomes      Exercise & Equipment Safety: - Individual verbal instruction and demonstration of equipment use and safety with use of the equipment.   Cardiac Rehab from 01/04/2017 in [facility] Cardiac and Pulmonary Rehab  Date  10/19/16  Educator  Sb  Instruction Review Code  1- Verbalizes Understanding      Infection Prevention: - Provides verbal and written material to individual with discussion of infection control including proper hand washing and proper equipment cleaning during exercise session.   Cardiac Rehab from 01/04/2017 in [facility] Cardiac and Pulmonary Rehab  Date  10/19/16  Educator   Sb  Instruction Review Code  1- Verbalizes Understanding      Falls Prevention: - Provides verbal and written material to individual with discussion of falls prevention and safety.   Cardiac Rehab from 01/04/2017 in [facility] Cardiac and Pulmonary Rehab  Date  10/19/16  Educator  SB  Instruction Review Code (retired)  2- meets goals/outcomes  Instruction Review Code  1- Science writer Understanding      Diabetes: - Individual verbal and written instruction to review signs/symptoms of diabetes, desired ranges of glucose level fasting, after meals and with exercise. Advice that pre and post exercise glucose checks will be done for 3 sessions at entry of program.   Chronic Lung Diseases: - Group verbal and written instruction to review new updates, new respiratory medications, new advancements in procedures and treatments. Provide informative websites and "800" numbers of self-education.   Lung Procedures: - Group verbal and written instruction to describe testing methods done to diagnose lung disease. Review the outcome of test results. Describe the treatment choices: Pulmonary Function Tests, ABGs and oximetry.   Energy Conservation: - Provide group verbal and written instruction for methods to conserve energy, plan and organize activities. Instruct on pacing techniques, use of adaptive equipment and posture/positioning to relieve shortness of breath.   Triggers: - Group verbal and written instruction to review types of environmental controls: home humidity, furnaces, filters, dust mite/pet prevention, HEPA vacuums. To discuss weather changes, air quality and the benefits of nasal washing.   Exacerbations: - Group verbal and written instruction to provide: warning signs, infection symptoms, calling MD promptly, preventive modes, and value of vaccinations. Review: effective airway clearance, coughing and/or vibration techniques. Create an Sports administrator.   Oxygen: - Individual and group  verbal  and written instruction on oxygen therapy. Includes supplement oxygen, available portable oxygen systems, continuous and intermittent flow rates, oxygen safety, concentrators, and Medicare reimbursement for oxygen.   Respiratory Medications: - Group verbal and written instruction to review medications for lung disease. Drug class, frequency, complications, importance of spacers, rinsing mouth after steroid MDI's, and proper cleaning methods for nebulizers.   AED/CPR: - Group verbal and written instruction with the use of models to demonstrate the basic use of the AED with the basic ABC's of resuscitation.   Breathing Retraining: - Provides individuals verbal and written instruction on purpose, frequency, and proper technique of diaphragmatic breathing and pursed-lipped breathing. Applies individual practice skills.   Anatomy and Physiology of the Lungs: - Group verbal and written instruction with the use of models to provide basic lung anatomy and physiology related to function, structure and complications of lung disease.   Anatomy & Physiology of the Heart: - Group verbal and written instruction and models provide basic cardiac anatomy and physiology, with the coronary electrical and arterial systems. Review of: AMI, Angina, Valve disease, Heart Failure, Cardiac Arrhythmia, Pacemakers, and the ICD.   Cardiac Rehab from 01/04/2017 in [facility] Cardiac and Pulmonary Rehab  Date  11/02/16  Educator  CE  Instruction Review Code  1- Verbalizes Understanding      Heart Failure: - Group verbal and written instruction on the basics of heart failure: signs/symptoms, treatments, explanation of ejection fraction, enlarged heart and cardiomyopathy.   Sleep Apnea: - Individual verbal and written instruction to review Obstructive Sleep Apnea. Review of risk factors, methods for diagnosing and types of masks and machines for OSA.   Anxiety: - Provides group, verbal and written instruction on  the correlation between heart/lung disease and anxiety, treatment options, and management of anxiety.   Relaxation: - Provides group, verbal and written instruction about the benefits of relaxation for patients with heart/lung disease. Also provides patients with examples of relaxation techniques.   Cardiac Medications: - Group verbal and written instruction to review commonly prescribed medications for heart disease. Reviews the medication, class of the drug, and side effects.   Know Your Numbers: -Group verbal and written instruction about important numbers in your health.  Review of Cholesterol, Blood Pressure, Diabetes, and BMI and the role they play in your overall health.   Other: -Provides group and verbal instruction on various topics (see comments)    Knowledge Questionnaire Score:     Knowledge Questionnaire Score - 01/01/17 0909      Knowledge Questionnaire Score   Pre Score 23/28   Post Score 27/28       Core Components/Risk Factors/Patient Goals at Admission:     Personal Goals and Risk Factors at Admission - 10/19/16 1524      Core Components/Risk Factors/Patient Goals on Admission    Weight Management Yes;Weight Maintenance   Admit Weight 210 lb 14.4 oz (95.7 kg)   Goal Weight: Short Term 210 lb (95.3 kg)   Goal Weight: Long Term 210 lb (95.3 kg)   Expected Outcomes Short Term: Continue to assess and modify interventions until short term weight is achieved;Long Term: Adherence to nutrition and physical activity/exercise program aimed toward attainment of established weight goal;Weight Maintenance: Understanding of the daily nutrition guidelines, which includes 25-35% calories from fat, 7% or less cal from saturated fats, less than 246m cholesterol, less than 1.5gm of sodium, & 5 or more servings of fruits and vegetables daily   Intervention Assist the participant in steps to quit. Provide individualized  education and counseling about committing to Tobacco  Cessation, relapse prevention, and pharmacological support that can be provided by physician.;Advice worker, assist with locating and accessing local/national Quit Smoking programs, and support quit date choice.   Expected Outcomes Short Term: Will demonstrate readiness to quit, by selecting a quit date.;Short Term: Will quit all tobacco product use, adhering to prevention of relapse plan.;Long Term: Complete abstinence from all tobacco products for at least 12 months from quit date.   Hypertension Yes   Intervention Provide education on lifestyle modifcations including regular physical activity/exercise, weight management, moderate sodium restriction and increased consumption of fresh fruit, vegetables, and low fat dairy, alcohol moderation, and smoking cessation.;Monitor prescription use compliance.   Expected Outcomes Short Term: Continued assessment and intervention until BP is < 140/23m HG in hypertensive participants. < 130/832mHG in hypertensive participants with diabetes, heart failure or chronic kidney disease.;Long Term: Maintenance of blood pressure at goal levels.   Lipids Yes   Intervention Provide education and support for participant on nutrition & aerobic/resistive exercise along with prescribed medications to achieve LDL <7090mHDL >37m7m Expected Outcomes Short Term: Participant states understanding of desired cholesterol values and is compliant with medications prescribed. Participant is following exercise prescription and nutrition guidelines.;Long Term: Cholesterol controlled with medications as prescribed, with individualized exercise RX and with personalized nutrition plan. Value goals: LDL < 70mg35mL > 40 mg.      Core Components/Risk Factors/Patient Goals Review:      Goals and Risk Factor Review    Row Name 11/23/16 0839 12/09/16 0835 01/01/17 0833         Core Components/Risk Factors/Patient Goals Review   Personal Goals Review Weight  Management/Obesity;Tobacco Cessation;Hypertension;Lipids Weight Management/Obesity;Tobacco Cessation;Hypertension;Lipids Weight Management/Obesity;Tobacco Cessation;Hypertension;Lipids     Review Amonte's weight is down some since starting rehab.  He does not have much of an appetite and has lost some weight.  He is watching what he is eating and cut back on sugar and salt.  His blood pressures have been good.  He says that cutting back on salt and losing weight has helped.  He does not have any problems with his medications other than he misses doses on occasion.  He is still not ready to quit smoking. He has cut back to half a pack a day.  He says that it is really the habit more than the need for nicotine.  He doesn't smoke around other people.  He knows that the only way to quit is to make the decision to quit himself. [place]ues to do well in rehab.  His weight has stalled out between 204-208 lbs.  He has an appetite but chooses not to eat as he continues to watch his salt intake.    His blood pressures continue to do well.  No problems with his medications.  He is still not ready to quit smoking.  Most days he is still at a pack a day, but some days are a little less.    KenneFloreeling good overall.  His weight has been stable and he continues to lose.  Blood pressures have been good and his medications seem to working for him.     Expected Outcomes Short: Continue to work on weight loss.  Long: Continue to work on risk factor modification and consider quitting smoking.  Short: Continue to work on weight loss.  Long: Continue to work on risk factor modification and smoking reduction. Short: Graduate  Long: Continue to work  on risk factors modfications.         Core Components/Risk Factors/Patient Goals at Discharge (Final Review):      Goals and Risk Factor Review - 01/01/17 0833      Core Components/Risk Factors/Patient Goals Review   Personal Goals Review Weight  Management/Obesity;Tobacco Cessation;Hypertension;Lipids   Review Arvid is feeling good overall.  His weight has been stable and he continues to lose.  Blood pressures have been good and his medications seem to working for him.   Expected Outcomes Short: Graduate  Long: Continue to work on risk factors modfications.       ITP Comments:     ITP Comments    Row Name 10/19/16 1322 10/28/16 0636 11/09/16 0745 11/20/16 0908 11/25/16 1281   ITP Comments Medical review completed. Initial IPT created. Documentation of diagnosis can be found in [facility] 08/10/2016 encounter 30 day review. Continue with ITP unless directed changes per Medical Director review     New start  Alfie came in this morning for class.  He said he was having a hard time sitting down and was very achy.  Over the weekend, he fell off the bottom step of a ladder.  We encouraged him to rest and seek medical care if symptoms get worse.  Called to check on status of return.  Check he had fallen off ladder.  The pain is moving down leg.  He is afraid it could be a blood clot.  I encouraged him to go see a doctor.  He hopes to try to return next Wednesday. 30 day review. Continue with ITP unless directed changes per Medical Director review    [place] Name 12/23/16 1054           ITP Comments 30 day review. Continue with ITP unless directed changes per Medical Director review.            Comments: Discharge ITP

## 2017-01-06 NOTE — Progress Notes (Signed)
Daily Session Note  Patient Details  Name: Peter [name] MRN: [id number] Date of Birth: 1940-07-08 Referring Provider:     Cardiac Rehab from 10/19/2016 in [facility] Cardiac and Pulmonary Rehab  Referring Provider  [name]      Encounter Date: 01/06/2017  Check In:     Session Check In - 01/06/17 0805      Check-In   Location [facility]-Cardiac & Pulmonary Rehab   Staff Present Heath Lark, RN, BSN, CCRP;Jessica Luan Pulling, MA, ACSM RCEP, Exercise Physiologist;Joseph Flavia Shipper   Supervising physician immediately available to respond to emergencies See telemetry face sheet for immediately available ER MD   Medication changes reported     No   Fall or balance concerns reported    No   Warm-up and Cool-down Performed on first and last piece of equipment   Resistance Training Performed Yes   VAD Patient? No     Pain Assessment   Currently in Pain? No/denies   Multiple Pain Sites No         History  Smoking Status  . Current Every Day Smoker  . Packs/day: 0.50  . Years: 0.50  . Types: Cigarettes  Smokeless Tobacco  . Never Used    Comment: 11/23/16 Still not ready to quit smoking.    Goals Met:  Independence with exercise equipment Exercise tolerated well No report of cardiac concerns or symptoms Strength training completed today  Goals Unmet:  Not Applicable  Comments: Pt able to follow exercise prescription today without complaint.  Will continue to monitor for progression.  Peter [name] graduated today from cardiac rehab with 36 sessions completed.  Details of the patient's exercise prescription and what He needs to do in order to continue the prescription and progress were discussed with patient.  Patient was given a copy of prescription and goals.  Patient verbalized understanding.  Peter [name] plans to continue to exercise by joining [name]..   Dr. Emily Filbert is Medical Director for [facility].

## 2017-01-06 NOTE — Progress Notes (Signed)
Cardiac Individual Treatment Plan  Patient Details  Name: XADRIAN CRAIGHEAD MRN: [id number] Date of Birth: [birth year] Referring Provider:     Cardiac Rehab from 10/19/2016 in [facility] Cardiac and Pulmonary Rehab  Referring Provider  [name]      Initial Encounter Date:    Cardiac Rehab from 10/19/2016 in [facility] Cardiac and Pulmonary Rehab  Date  10/19/16  Referring Provider  [name]      Visit Diagnosis: Heart failure, chronic systolic ([place])  Patient's Home Medications on Admission:  Current Outpatient Prescriptions:  .  amLODipine-benazepril (LOTREL) 10-40 MG capsule, TAKE 1 CAPSULE BY MOUTH EVERY DAY, Disp: , Rfl:  .  aspirin 325 MG tablet, Take 325 mg by mouth 2 (two) times daily. Patient takes if there is any pain or burning in the chest., Disp: , Rfl:  .  atorvastatin (LIPITOR) 40 MG tablet, Take 1 tablet (40 mg total) by mouth daily at 6 PM., Disp: 30 tablet, Rfl: 0 .  carvedilol (COREG) 3.125 MG tablet, Take 3.125 mg by mouth 2 (two) times daily with a meal., Disp: , Rfl:  .  cetirizine (WAL-ZYR) 10 MG tablet, Take 10 mg by mouth., Disp: , Rfl:  .  clobetasol cream (TEMOVATE) 0.05 %, APPLY TO PSORIASIS BID PRN UNTIL SMOOTH, Disp: , Rfl: 1 .  clopidogrel (PLAVIX) 75 MG tablet, TAKE 1 TABLET(75 MG) BY MOUTH EVERY DAY, Disp: 30 tablet, Rfl: 1 .  cyanocobalamin 1000 MCG tablet, Take 1,000 mcg by mouth daily. , Disp: , Rfl:  .  furosemide (LASIX) 20 MG tablet, Take 1 tablet (20 mg total) by mouth daily. (Patient taking differently: Take 20 mg by mouth 2 (two) times daily. ), Disp: 30 tablet, Rfl: 1 .  GLUCOSAMINE-CHONDROITIN-VIT C PO, Take 1 tablet by mouth daily., Disp: , Rfl:  .  hydrochlorothiazide (HYDRODIURIL) 12.5 MG tablet, Take 12.5 mg by mouth daily. , Disp: , Rfl:  .  isosorbide mononitrate (IMDUR) 30 MG 24 hr tablet, Take 30 mg by mouth daily., Disp: , Rfl:  .  omeprazole (PRILOSEC) 20 MG capsule, Take by mouth., Disp: , Rfl:  .  spironolactone (ALDACTONE) 25 MG tablet, Take  0.5 tablets by mouth daily., Disp: , Rfl:  .  tamsulosin (FLOMAX) 0.4 MG CAPS capsule, TAKE 1 CAPSULE BY MOUTH EVERY DAY, Disp: , Rfl:   Past Medical History: Past Medical History:  Diagnosis Date  . Anginal pain ([place])   . Asthma   . Bladder infection, acute 03/2011   "had a whole lot of bleeding from this"  . CHF (congestive heart failure) ([place])   . Coronary artery disease   . High cholesterol   . Hypertension   . 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 Myocardial infarction ([place]) 1996  . NSTEMI (non-ST elevated myocardial infarction) ([place]) 12/14/2011  . Shortness of breath 12/14/2011   "@ rest, lying down, w/exertion"    Tobacco Use: History  Smoking Status  . Current Every Day Smoker  . Packs/day: 0.50  . Years: 0.50  . Types: Cigarettes  Smokeless Tobacco  . Never Used    Comment: 11/23/16 Still not ready to quit smoking.    Labs: Recent Review Flowsheet Data    Labs for ITP Cardiac and Pulmonary Rehab Latest Ref Rng & Units 12/16/2011 08/08/2016   Cholestrol 0 - 200 mg/dL 141 108   LDLCALC 0 - 99 mg/dL 83 50   HDL >40 mg/dL 38(L) 36(L)   Trlycerides <  150 mg/dL 102 111       Exercise Target Goals:    Exercise Program Goal: Individual exercise prescription set with THRR, safety & activity barriers. Participant demonstrates ability to understand and report RPE using BORG scale, to self-measure pulse accurately, and to acknowledge the importance of the exercise prescription.  Exercise Prescription Goal: Starting with aerobic activity 30 plus minutes a day, 3 days per week for initial exercise prescription. Provide home exercise prescription and guidelines that participant acknowledges understanding prior to discharge.  Activity Barriers & Risk Stratification:   6 Minute Walk:     6 Minute Walk    Row Name 10/19/16 1406 12/23/16 0839       6 Minute Walk   Phase Initial Discharge    Distance 1200 feet 1365 feet     Distance % Change  - 13.8 %    Distance Feet Change  - 165 ft    Walk Time 6 minutes 6 minutes    # of Rest Breaks 0 0    MPH 2.27 2.59    METS 2.29 3.1    RPE 12 13    VO2 Peak 8.02 10.85    Symptoms Yes (comment) Yes (comment)    Comments hip pain 5/10 hip pain 5/10    Resting HR 87 bpm 75 bpm    Resting BP 148/64 130/58    Max Ex. HR 91 bpm 100 bpm    Max Ex. BP 140/80 196/84    2 Minute Post BP  - 156/80       Oxygen Initial Assessment:   Oxygen Re-Evaluation:   Oxygen Discharge (Final Oxygen Re-Evaluation):   Initial Exercise Prescription:     Initial Exercise Prescription - 10/19/16 1400      Date of Initial Exercise RX and Referring Provider   Date 10/19/16   Referring Provider [name]     Treadmill   MPH 1.7  3/3/3   Grade 0   Minutes 15   METs 2.3     Recumbant Bike   Level 1   RPM 60   Minutes 15   METs 2     REL-XR   Level 1   Speed 60   Minutes 15   METs 2.3     T5 Nustep   Level 1   SPM 80   Minutes 15   METs 2     Prescription Details   Frequency (times per week) 3   Duration Progress to 30 minutes of continuous aerobic without signs/symptoms of physical distress     Intensity   THRR 40-80% of Max Heartrate 109-132   Ratings of Perceived Exertion 11-13   Perceived Dyspnea 0-4     Resistance Training   Training Prescription Yes   Weight 3   Reps 10-15      Perform Capillary Blood Glucose checks as needed.  Exercise Prescription Changes:     Exercise Prescription Changes    Row Name 10/19/16 1300 10/30/16 0900 11/05/16 1100 12/01/16 1600 12/16/16 1500     Response to Exercise   Blood Pressure (Admit) 148/64  - 118/50 140/62 124/64   Blood Pressure (Exercise) 140/80  - 140/66 146/64 178/70   Blood Pressure (Exit)  -  - 138/56 134/74 122/52   Heart Rate (Admit) 80 bpm  - 79 bpm 88 bpm 77 bpm   Heart Rate (Exercise)  -  - 103 bpm 109 bpm 94 bpm   Heart Rate (Exit) 83 bpm  - 58 bpm 67  bpm 65 bpm   Rating of  Perceived Exertion (Exercise) 12  - _0 Symptoms  -  - none none hip pain on treadmill   Duration  -  - Continue with 45 min of aerobic exercise without signs/symptoms of physical distress. Continue with 45 min of aerobic exercise without signs/symptoms of physical distress. Continue with 45 min of aerobic exercise without signs/symptoms of physical distress.   Intensity  -  - THRR unchanged THRR unchanged THRR unchanged     Progression   Progression  -  - Continue to progress workloads to maintain intensity without signs/symptoms of physical distress. Continue to progress workloads to maintain intensity without signs/symptoms of physical distress. Continue to progress workloads to maintain intensity without signs/symptoms of physical distress.   Average METs  -  - 3.07 3.15 3.12     Resistance Training   Training Prescription  -  - Yes Yes Yes   Weight  -  - 3 lbs 3 lbs 3 lbs   Reps  -  - 10-15 10-15 10-15     Interval Training   Interval Training  -  - No No No     Treadmill   MPH  -  - 1.7 1.7 1.7   Grade  -  - 0 0.5 0.5   Minutes  -  - _1 METs  -  - 2.3 2.54 2.42     REL-XR   Level  -  - _2 Minutes  -  - _3 METs  -  - 4.9 4.7 4.8     T5 Nustep   Level  -  - _4 Minutes  -  - _5 METs  -  - 2 2.2 2.1     Home Exercise Plan   Plans to continue exercise at  - [employer] (comment)  walking with plans to join [employer] after graduation Home (comment)  walking with plans to join [employer] after graduation   Frequency  - Add 1 additional day to program exercise sessions. Add 1 additional day to program exercise sessions. Add 1 additional day to program exercise sessions. Add 1 additional day to program exercise sessions.   Initial Home Exercises Provided  - 10/30/16 10/30/16 10/30/16 10/30/16   Row Name 12/30/16 1500             Response to Exercise   Blood Pressure (Admit) 124/64       Blood Pressure (Exercise)  140/50       Blood Pressure (Exit) 126/64       Heart Rate (Admit) 81 bpm       Heart Rate (Exercise) 115 bpm       Heart Rate (Exit) 54 bpm       Rating of Perceived Exertion (Exercise) 12       Symptoms hip pain on treadmill       Duration Continue with 45 min of aerobic exercise without signs/symptoms of physical distress.       Intensity THRR unchanged         Progression   Progression Continue to progress workloads to maintain intensity without signs/symptoms of physical distress.       Average METs 3.21         Resistance Training   Training Prescription Yes       Weight 4 lbs  Reps 10-15         Interval Training   Interval Training No         Treadmill   MPH 1.7       Grade 1       Minutes 15       METs 2.54         REL-XR   Level 7       Minutes 15       METs 4.9         T5 Nustep   Level 5       Minutes 15       METs 2.2         Home Exercise Plan   Plans to continue exercise at Home (comment)  walking with plans to join [employer] after graduation       Frequency Add 2 additional days to program exercise sessions.       Initial Home Exercises Provided 10/30/16          Exercise Comments:     Exercise Comments    Row Name 10/26/16 1275 01/01/17 0834         Exercise Comments First full day of exercise!  Patient was oriented to gym and equipment including functions, settings, policies, and procedures.  Patient's individual exercise prescription and treatment plan were reviewed.  All starting workloads were established based on the results of the 6 minute walk test done at initial orientation visit.  The plan for exercise progression was also introduced and progression will be customized based on patient's performance and goals. Vian had some chest pressure on the treadmill today.  He felt that it was mostly gas and and he was able to continue to exercise on the seated equipment without a problem.         Exercise Goals and Review:      Exercise Goals    Row Name 10/19/16 1408             Exercise Goals   Increase Physical Activity Yes       Intervention Provide advice, education, support and counseling about physical activity/exercise needs.;Develop an individualized exercise prescription for aerobic and resistive training based on initial evaluation findings, risk stratification, comorbidities and participant's personal goals.       Expected Outcomes Achievement of increased cardiorespiratory fitness and enhanced flexibility, muscular endurance and strength shown through measurements of functional capacity and personal statement of participant.       Increase Strength and Stamina Yes       Intervention Provide advice, education, support and counseling about physical activity/exercise needs.;Develop an individualized exercise prescription for aerobic and resistive training based on initial evaluation findings, risk stratification, comorbidities and participant's personal goals.       Expected Outcomes Achievement of increased cardiorespiratory fitness and enhanced flexibility, muscular endurance and strength shown through measurements of functional capacity and personal statement of participant.          Exercise Goals Re-Evaluation :     Exercise Goals Re-Evaluation    Row Name 10/30/16 0901 11/05/16 1133 11/23/16 0849 12/01/16 1618 12/09/16 0833     Exercise Goal Re-Evaluation   Exercise Goals Review Increase Physical Activity;Increase Strenth and Stamina Increase Physical Activity;Increase Strenth and Stamina Increase Physical Activity;Increase Strenth and Stamina Increase Physical Activity;Increase Strenth and Stamina Increase Physical Activity;Increase Strength and Stamina;Understanding of Exercise Prescription   Comments Akin has not been exercising outside class.  He does not want to go  to a gym and is considering [employer].  He states he can feel a difference in his first week of exercise. Ibrahim is doing  well in rehab.  He is up to level 5 on the NuStep and walking on the treadmill is his most difficult piece.  We will continue to work with him on progression. Aquiles has been walking more at home.  He has found it helpful in trying to lose weight.  He has noticed that his strength and stamina has improved since starting. He was surprised at how well he was able to return to exercise despite having been out for two week.s. Blayton continues to do well in rehab.  He is getting stronger and able to do more at home. He still struggles with walking on the treadmill due to hip pain, but he has been able to increase other workloads.  We will continue to monitor his progression. Dell has been doing well in rehab.  He was able to add some incline to the treadmill today!!  He is still only walking some at home due to his hip pain.  However, he does feel that his strength and stamina are getting better and he is able to do more around the house..   Expected Outcomes Short - Ike will ad 1 day to his exercise routine.  Long - Zahi will exercise 3-5 days a wekk on a regular basis. Short: Crystal will try to add in some exercise at home.  Long: Continue to make exercise part of routine. Short: Continue to walk at home.  Long: Contintue to make exercise part of routine.  Short: Continue to try to walk more at home.  Long: Continue to make exercise part of routine.  Short: Try to walk more at home.  Long: Aim to get in some exercise everyday.    [place] Name 12/16/16 1536 12/23/16 0840 12/30/16 1516         Exercise Goal Re-Evaluation   Exercise Goals Review Increase Physical Activity;Increase Strength and Stamina Increase Physical Activity;Increase Strength and Stamina Increase Physical Activity;Increase Strength and Stamina     Comments Ziyad contineus to do well in rehab.  He is up to level 7 on the XR.  We will continue to monitor his progression. Improved walk test by 165 ft!!! Yvone Neu will be graduating soon!  He  did well on his walk test.  He has added a 1% grade to his treadmill.  We will continue to monitor his progress until graduation.      Expected Outcomes Short: Continue to try to walk as much as possible.  Long: Make exercise part of routine.  Short: [employer].  Long: Continue to walk on own. Short: [employer].  Long: Continue to walk on own.        Discharge Exercise Prescription (Final Exercise Prescription Changes):     Exercise Prescription Changes - 12/30/16 1500      Response to Exercise   Blood Pressure (Admit) 124/64   Blood Pressure (Exercise) 140/50   Blood Pressure (Exit) 126/64   Heart Rate (Admit) 81 bpm   Heart Rate (Exercise) 115 bpm   Heart Rate (Exit) 54 bpm   Rating of Perceived Exertion (Exercise) 12   Symptoms hip pain on treadmill   Duration Continue with 45 min of aerobic exercise without signs/symptoms of physical distress.   Intensity THRR unchanged     Progression   Progression Continue to progress workloads to maintain intensity without signs/symptoms of physical distress.  Average METs 3.21     Resistance Training   Training Prescription Yes   Weight 4 lbs   Reps 10-15     Interval Training   Interval Training No     Treadmill   MPH 1.7   Grade 1   Minutes 15   METs 2.54     REL-XR   Level 7   Minutes 15   METs 4.9     T5 Nustep   Level 5   Minutes 15   METs 2.2     Home Exercise Plan   Plans to continue exercise at Home (comment)  walking with plans to join [employer] after graduation   Frequency Add 2 additional days to program exercise sessions.   Initial Home Exercises Provided 10/30/16      Nutrition:  Target Goals: Understanding of nutrition guidelines, daily intake of sodium <1542m, cholesterol <2039m calories 30% from fat and 7% or less from saturated fats, daily to have 5 or more servings of fruits and vegetables.  Biometrics:     Pre Biometrics - 10/28/16 1501      Pre Biometrics   Waist Circumference  42.5 inches   Hip Circumference 43.5 inches   Waist to Hip Ratio 0.98 %   Single Leg Stand 1.51 seconds         Post Biometrics - 12/23/16 0844       Post  Biometrics   Height 5' 10.75" (1.797 m)   Weight 209 lb 14.4 oz (95.2 kg)   Waist Circumference 44 inches   Hip Circumference 43.5 inches   Waist to Hip Ratio 1.01 %   BMI (Calculated) 29.48   Single Leg Stand 2.21 seconds      Nutrition Therapy Plan and Nutrition Goals:     Nutrition Therapy & Goals - 11/23/16 1316      Nutrition Therapy   Diet Instructed on a meal plan based on heart healthy dietary guidelines   Drug/Food Interactions Statins/Certain Fruits   Protein (specify units) 7   Fiber 25 grams   Whole Grain Foods 3 servings   Saturated Fats 12 max. grams   Fruits and Vegetables 5 servings/day   Sodium 1500 grams     Personal Nutrition Goals   Nutrition Goal Try some low sodium products such as Vi[place]ow sodium sauces, StarKist very low sodium tuna, Mrs. DASH lemon pepper and taco seasoning, Nathan french fries   Personal Goal #2 Add low sodium sides such as steamed vegetables and fruits to entrees that have more sodium.    Personal Goal #3 Read labels for saturated fat, trans fat and sodium.   Comments Patient eats out frequently but was interested in simple lower sodium meals that can be prepared at home as well as lower sodium choices when eating "out". Discussed this at length.     Intervention Plan   Intervention Prescribe, educate and counsel regarding individualized specific dietary modifications aiming towards targeted core components such as weight, hypertension, lipid management, diabetes, heart failure and other comorbidities.;Nutrition handout(s) given to patient.   Expected Outcomes Short Term Goal: Understand basic principles of dietary content, such as calories, fat, sodium, cholesterol and nutrients.;Short Term Goal: A plan has been developed with personal nutrition goals set during dietitian  appointment.;Long Term Goal: Adherence to prescribed nutrition plan.      Nutrition Discharge: Rate Your Plate Scores:     Nutrition Assessments - 01/01/17 0909      MEDFICTS Scores   Pre Score 61  Post Score 37   Score Difference -24      Nutrition Goals Re-Evaluation:     Nutrition Goals Re-Evaluation    [place] Name 11/23/16 0852 12/09/16 0841           Goals   Current Weight 209 lb (94.8 kg) 212 lb (96.2 kg)      Nutrition Goal  - Try some low sodium products such as [place] low sodium sauces, StarKist very low sodium tuna, Mrs. DASH lemon pepper and taco seasoning, Nathan french fries; Read labels, steam vegetables and fruits      Comment has appt to meet with dietician today Chief has really started watching his sodium intake very closely.  He uses Mrs. Dash routinely.  He also has really focused on reading his food labels.  However, he does not choose to eat frequently.       Expected Outcome work on poor appetite Short: Try to eat more frequently.  Long: Continue to work on weight loss and sodium intake.          Nutrition Goals Discharge (Final Nutrition Goals Re-Evaluation):     Nutrition Goals Re-Evaluation - 12/09/16 0841      Goals   Current Weight 212 lb (96.2 kg)   Nutrition Goal Try some low sodium products such as [place] low sodium sauces, StarKist very low sodium tuna, Mrs. DASH lemon pepper and taco seasoning, Nathan french fries; Read labels, steam vegetables and fruits   Comment Devron has really started watching his sodium intake very closely.  He uses Mrs. Dash routinely.  He also has really focused on reading his food labels.  However, he does not choose to eat frequently.    Expected Outcome Short: Try to eat more frequently.  Long: Continue to work on weight loss and sodium intake.       Psychosocial: Target Goals: Acknowledge presence or absence of significant depression and/or stress, maximize coping skills, provide positive support  system. Participant is able to verbalize types and ability to use techniques and skills needed for reducing stress and depression.   Initial Review & Psychosocial Screening:     Initial Psych Review & Screening - 10/19/16 1344      Initial Review   Current issues with None Identified     Family Dynamics   Good Support System? Yes  Good friend Nelida Meuse     Barriers   Psychosocial barriers to participate in program There are no identifiable barriers or psychosocial needs.;The patient should benefit from training in stress management and relaxation.     Screening Interventions   Interventions Encouraged to exercise;Provide feedback about the scores to participant;To provide support and resources with identified psychosocial needs      Quality of Life Scores:      Quality of Life - 01/01/17 0909      Quality of Life Scores   Health/Function Pre 22.87 %   Health/Function Post 24.03 %   Health/Function % Change 5.07 %   Socioeconomic Pre 23.21 %   Socioeconomic Post 27.21 %   Socioeconomic % Change  17.23 %   Psych/Spiritual Pre 22.5 %   Psych/Spiritual Post 28.07 %   Psych/Spiritual % Change 24.76 %   Family Pre 26.5 %   Family Post 25.2 %   Family % Change -4.91 %   GLOBAL Pre 23.4 %   GLOBAL Post 25.69 %   GLOBAL % Change 9.79 %      PHQ-9: Recent Review Flowsheet Data  Depression screen [facility] 2/9 01/01/2017 12/18/2016 10/19/2016 09/15/2016 08/18/2016   Decreased Interest 0 0 0 0 0   Down, Depressed, Hopeless 0 0 0 0 0   PHQ - 2 Score 0 0 0 0 0   Altered sleeping 0 - 0 - -   Tired, decreased energy 0 - 1 - -   Change in appetite 0 - 0 - -   Feeling bad or failure about yourself  0 - 0 - -   Trouble concentrating 0 - 0 - -   Moving slowly or fidgety/restless 0 - 0 - -   Suicidal thoughts 0 - 0 - -   PHQ-9 Score 0 - 1 - -   Difficult doing work/chores Not difficult at all - Not difficult at all - -     Interpretation of Total Score  Total Score Depression  Severity:  1-4 = Minimal depression, 5-9 = Mild depression, 10-14 = Moderate depression, 15-19 = Moderately severe depression, 20-27 = Severe depression   Psychosocial Evaluation and Intervention:     Psychosocial Evaluation - 11/23/16 0931      Psychosocial Evaluation & Interventions   Interventions Encouraged to exercise with the program and follow exercise prescription   Comments Counselor met with Mr. Velardi Meisenheimer) today for initial psychosocial evaluation.  He is a 76 year old who had a stent blockage recently.  He also had a recent fall off a step ladder that took him out of this program for several weeks.  Donis has a strong support system with a significant other; daughter in [place]; Brother in [place]; and a strong men's support group that he actively participates in.  Nathanyl has minimal health issues and although he doesn't sleep well at night in general, he reports getting about 6 hours in a 24 hour period most days.  His appetite has decreased since the recent cardiac event.  He denies a history of depression or anxiety or any currrent symptoms.  He is typically in a positive mood and has minimal stress reported in his life, other than his health.  Michele has goals to improve his health and increase his stamina and strength.  Staff will follow him throughout the course of this program.     Expected Outcomes Kiernan will benefit from consistent exercise to achieve his stated goals.  He is scheduled to meet with the dietician to address his sodium restrictions.  The educational components of this program will also be helpful for Dakwon to learn ways to improve his health and manage his disease.     Continue Psychosocial Services  Follow up required by staff      Psychosocial Re-Evaluation:     Psychosocial Re-Evaluation    Row Name 11/23/16 0855 12/09/16 0925           Psychosocial Re-Evaluation   Current issues with Current Sleep Concerns Current Sleep Concerns       Comments Mc does not have any major stressors.  He just lets things go and sloughs them off.   Looking back he realizes just what stress really was and now its now where near as bad. He does not have a good sleep routine and thus continues to wake several times at night. But he consistently gets 6-7 hours a night Kieren has been doing well in rehab and is opening up more. 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  He is still getting enough rest, but he  continues to be up and down with his bladder at night.  His biggest stressor currently has been trying to finish his taxes from last year.  He continues to smoke and is not ready to quit yet.       Expected Outcomes Short: Continue to maintain postive attitude.  Long: Work on stress routine. Short: Continue to have a postive attitude.  Long: Continue to get good sleep and work on quitting smoking.          Psychosocial Discharge (Final Psychosocial Re-Evaluation):     Psychosocial Re-Evaluation - 12/09/16 0925      Psychosocial Re-Evaluation   Current issues with Current Sleep Concerns   Comments Bryar has been doing well in rehab and is opening up more. 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  He is still getting enough rest, but he continues to be up and down with his bladder at night.  His biggest stressor currently has been trying to finish his taxes from last year.  He continues to smoke and is not ready to quit yet.    Expected Outcomes Short: Continue to have a postive attitude.  Long: Continue to get good sleep and work on quitting smoking.       Vocational Rehabilitation: Provide vocational rehab assistance to qualifying candidates.   Vocational Rehab Evaluation & Intervention:   Education: Education Goals: Education classes will be provided on a variety of topics geared toward better understanding of heart health and risk factor  modification. Participant will state understanding/return demonstration of topics presented as noted by education test scores.  Learning Barriers/Preferences:     Learning Barriers/Preferences - 10/19/16 1347      Learning Barriers/Preferences   Learning Barriers None   Learning Preferences None      Education Topics: General Nutrition Guidelines/Fats and Fiber: -Group instruction provided by verbal, written material, models and posters to present the general guidelines for heart healthy nutrition. Gives an explanation and review of dietary fats and fiber.   Cardiac Rehab from 01/04/2017 in [facility] Cardiac and Pulmonary Rehab  Date  11/30/16  Educator  CR  Instruction Review Code  1- Verbalizes Understanding      Controlling Sodium/Reading Food Labels: -Group verbal and written material supporting the discussion of sodium use in heart healthy nutrition. Review and explanation with models, verbal and written materials for utilization of the food label.   Cardiac Rehab from 01/04/2017 in [facility] Cardiac and Pulmonary Rehab  Date  12/07/16  Educator  CR  Instruction Review Code  1- Verbalizes Understanding      Exercise Physiology & Risk Factors: - Group verbal and written instruction with models to review the exercise physiology of the cardiovascular system and associated critical values. Details cardiovascular disease risk factors and the goals associated with each risk factor.   Cardiac Rehab from 01/04/2017 in [facility] Cardiac and Pulmonary Rehab  Date  12/16/16  Educator  [facility]  Instruction Review Code  1- Verbalizes Understanding      Aerobic Exercise & Resistance Training: - Gives group verbal and written discussion on the health impact of inactivity. On the components of aerobic and resistive training programs and the benefits of this training and how to safely progress through these programs.   Cardiac Rehab from 01/04/2017 in [facility] Cardiac and Pulmonary Rehab  Date  12/21/16   Educator  [facility]  Instruction Review Code  1- Geologist, engineering, Balance, General  Exercise Guidelines: - Provides group verbal and written instruction on the benefits of flexibility and balance training programs. Provides general exercise guidelines with specific guidelines to those with heart or lung disease. Demonstration and skill practice provided.   Cardiac Rehab from 01/04/2017 in [facility] Cardiac and Pulmonary Rehab  Date  12/23/16  Educator  [facility]  Instruction Review Code  1- Verbalizes Understanding      Stress Management: - Provides group verbal and written instruction about the health risks of elevated stress, cause of high stress, and healthy ways to reduce stress.   Cardiac Rehab from 01/04/2017 in [facility] Cardiac and Pulmonary Rehab  Date  12/30/16  Educator  [facility]  Instruction Review Code  1- Verbalizes Understanding      Depression: - Provides group verbal and written instruction on the correlation between heart/lung disease and depressed mood, treatment options, and the stigmas associated with seeking treatment.   Cardiac Rehab from 01/04/2017 in [facility] Cardiac and Pulmonary Rehab  Date  12/02/16  Educator  [facility]  Instruction Review Code (retired)  2- meets Designer, fashion/clothing & Physiology of the Heart: - Group verbal and written instruction and models provide basic cardiac anatomy and physiology, with the coronary electrical and arterial systems. Review of: AMI, Angina, Valve disease, Heart Failure, Cardiac Arrhythmia, Pacemakers, and the ICD.   Cardiac Rehab from 01/04/2017 in [facility] Cardiac and Pulmonary Rehab  Date  11/02/16  Educator  CE  Instruction Review Code  1- Verbalizes Understanding      Cardiac Procedures: - Group verbal and written instruction to review commonly prescribed medications for heart disease. Reviews the medication, class of the drug, and side effects. Includes the steps to properly store meds and maintain the prescription  regimen. (beta blockers and nitrates)   Cardiac Rehab from 01/04/2017 in [facility] Cardiac and Pulmonary Rehab  Date  01/04/17  Educator  CE  Instruction Review Code  1- Verbalizes Understanding      Cardiac Medications I: - Group verbal and written instruction to review commonly prescribed medications for heart disease. Reviews the medication, class of the drug, and side effects. Includes the steps to properly store meds and maintain the prescription regimen.   Cardiac Medications II: -Group verbal and written instruction to review commonly prescribed medications for heart disease. Reviews the medication, class of the drug, and side effects. (all other drug classes)    Go Sex-Intimacy & Heart Disease, Get SMART - Goal Setting: - Group verbal and written instruction through game format to discuss heart disease and the return to sexual intimacy. Provides group verbal and written material to discuss and apply goal setting through the application of the S.M.A.R.T. Method.   Cardiac Rehab from 01/04/2017 in [facility] Cardiac and Pulmonary Rehab  Date  01/04/17  Educator  CE  Instruction Review Code  1- Verbalizes Understanding      Other Matters of the Heart: - Provides group verbal, written materials and models to describe Heart Failure, Angina, Valve Disease, Peripheral Artery Disease, and Diabetes in the realm of heart disease. Includes description of the disease process and treatment options available to the cardiac patient.   Cardiac Rehab from 01/04/2017 in [facility] Cardiac and Pulmonary Rehab  Date  11/02/16  Educator  CE  Instruction Review Code  1- Verbalizes Understanding      Exercise & Equipment Safety: - Individual verbal instruction and demonstration of equipment use and safety with use of the equipment.   Cardiac Rehab from 01/04/2017 in [facility]  Cardiac and Pulmonary Rehab  Date  10/19/16  Educator  Sb  Instruction Review Code  1- Verbalizes Understanding      Infection  Prevention: - Provides verbal and written material to individual with discussion of infection control including proper hand washing and proper equipment cleaning during exercise session.   Cardiac Rehab from 01/04/2017 in [facility] Cardiac and Pulmonary Rehab  Date  10/19/16  Educator  Sb  Instruction Review Code  1- Verbalizes Understanding      Falls Prevention: - Provides verbal and written material to individual with discussion of falls prevention and safety.   Cardiac Rehab from 01/04/2017 in [facility] Cardiac and Pulmonary Rehab  Date  10/19/16  Educator  SB  Instruction Review Code (retired)  2- meets goals/outcomes  Instruction Review Code  1- Science writer Understanding      Diabetes: - Individual verbal and written instruction to review signs/symptoms of diabetes, desired ranges of glucose level fasting, after meals and with exercise. Acknowledge that pre and post exercise glucose checks will be done for 3 sessions at entry of program.   Other: -Provides group and verbal instruction on various topics (see comments)    Knowledge Questionnaire Score:     Knowledge Questionnaire Score - 01/01/17 0909      Knowledge Questionnaire Score   Pre Score 23/28   Post Score 27/28      Core Components/Risk Factors/Patient Goals at Admission:     Personal Goals and Risk Factors at Admission - 10/19/16 1524      Core Components/Risk Factors/Patient Goals on Admission    Weight Management Yes;Weight Maintenance   Admit Weight 210 lb 14.4 oz (95.7 kg)   Goal Weight: Short Term 210 lb (95.3 kg)   Goal Weight: Long Term 210 lb (95.3 kg)   Expected Outcomes Short Term: Continue to assess and modify interventions until short term weight is achieved;Long Term: Adherence to nutrition and physical activity/exercise program aimed toward attainment of established weight goal;Weight Maintenance: Understanding of the daily nutrition guidelines, which includes 25-35% calories from fat, 7% or less cal  from saturated fats, less than 248m cholesterol, less than 1.5gm of sodium, & 5 or more servings of fruits and vegetables daily   Intervention Assist the participant in steps to quit. Provide individualized education and counseling about committing to Tobacco Cessation, relapse prevention, and pharmacological support that can be provided by physician.;OAdvice worker assist with locating and accessing local/national Quit Smoking programs, and support quit date choice.   Expected Outcomes Short Term: Will demonstrate readiness to quit, by selecting a quit date.;Short Term: Will quit all tobacco product use, adhering to prevention of relapse plan.;Long Term: Complete abstinence from all tobacco products for at least 12 months from quit date.   Hypertension Yes   Intervention Provide education on lifestyle modifcations including regular physical activity/exercise, weight management, moderate sodium restriction and increased consumption of fresh fruit, vegetables, and low fat dairy, alcohol moderation, and smoking cessation.;Monitor prescription use compliance.   Expected Outcomes Short Term: Continued assessment and intervention until BP is < 140/910mHG in hypertensive participants. < 130/8022mG in hypertensive participants with diabetes, heart failure or chronic kidney disease.;Long Term: Maintenance of blood pressure at goal levels.   Lipids Yes   Intervention Provide education and support for participant on nutrition & aerobic/resistive exercise along with prescribed medications to achieve LDL <79m36mDL >40mg72mExpected Outcomes Short Term: Participant states understanding of desired cholesterol values and is compliant with medications prescribed. Participant is following  exercise prescription and nutrition guidelines.;Long Term: Cholesterol controlled with medications as prescribed, with individualized exercise RX and with personalized nutrition plan. Value goals: LDL < 4m, HDL > 40  mg.      Core Components/Risk Factors/Patient Goals Review:      Goals and Risk Factor Review    Row Name 11/23/16 0839 12/09/16 0835 01/01/17 0833         Core Components/Risk Factors/Patient Goals Review   Personal Goals Review Weight Management/Obesity;Tobacco Cessation;Hypertension;Lipids Weight Management/Obesity;Tobacco Cessation;Hypertension;Lipids Weight Management/Obesity;Tobacco Cessation;Hypertension;Lipids     Review Etienne's weight is down some since starting rehab.  He does not have much of an appetite and has lost some weight.  He is watching what he is eating and cut back on sugar and salt.  His blood pressures have been good.  He says that cutting back on salt and losing weight has helped.  He does not have any problems with his medications other than he misses doses on occasion.  He is still not ready to quit smoking. He has cut back to half a pack a day.  He says that it is really the habit more than the need for nicotine.  He doesn't smoke around other people.  He knows that the only way to quit is to make the decision to quit himself. KJowelcontinues to do well in rehab.  His weight has stalled out between 204-208 lbs.  He has an appetite but chooses not to eat as he continues to watch his salt intake.    His blood pressures continue to do well.  No problems with his medications.  He is still not ready to quit smoking.  Most days he is still at a pack a day, but some days are a little less.    KPiercenis feeling good overall.  His weight has been stable and he continues to lose.  Blood pressures have been good and his medications seem to working for him.     Expected Outcomes Short: Continue to work on weight loss.  Long: Continue to work on risk factor modification and consider quitting smoking.  Short: Continue to work on weight loss.  Long: Continue to work on risk factor modification and smoking reduction. Short: Graduate  Long: Continue to work on risk factors  modfications.         Core Components/Risk Factors/Patient Goals at Discharge (Final Review):      Goals and Risk Factor Review - 01/01/17 0833      Core Components/Risk Factors/Patient Goals Review   Personal Goals Review Weight Management/Obesity;Tobacco Cessation;Hypertension;Lipids   Review KPervisis feeling good overall.  His weight has been stable and he continues to lose.  Blood pressures have been good and his medications seem to working for him.   Expected Outcomes Short: Graduate  Long: Continue to work on risk factors modfications.       ITP Comments:     ITP Comments    Row Name 10/19/16 1322 10/28/16 0636 11/09/16 0745 11/20/16 0908 11/25/16 [id number]  ITP Comments Medical review completed. Initial IPT created. Documentation of diagnosis can be found in [employer]/2018 encounter 30 day review. Continue with ITP unless directed changes per Medical Director review     New start  KBreckencame in this morning for class.  He said he was having a hard time sitting down and was very achy.  Over the weekend, he fell off the bottom step of a ladder.  We encouraged him to rest and  seek medical care if symptoms get worse.  Called to check on status of return.  Check he had fallen off ladder.  The pain is moving down leg.  He is afraid it could be a blood clot.  I encouraged him to go see a doctor.  He hopes to try to return next Wednesday. 30 day review. Continue with ITP unless directed changes per Medical Director review    [place] Name 12/23/16 1054           ITP Comments 30 day review. Continue with ITP unless directed changes per Medical Director review.            Comments: Discharge ITP

## 2017-01-06 NOTE — Progress Notes (Signed)
Discharge Progress Report  Patient Details  Name: Peter [name] MRN: [id number] Date of Birth: 1940-11-23 Referring Provider:     Cardiac Rehab from 10/19/2016 in [facility]  Referring Provider  [name]       Number of Visits: 36  Reason for Discharge:  Patient reached a stable level of exercise. Patient independent in their exercise. Patient has met program and personal goals.  Smoking History:  History  Smoking Status  . Current Every Day Smoker  . Packs/day: 0.50  . Years: 0.50  . Types: Cigarettes  Smokeless Tobacco  . Never Used    Comment: 11/23/16 Still not ready to quit smoking.    Diagnosis:  Heart failure, chronic systolic (HCC)  ADL UCSD:   Initial Exercise Prescription:     Initial Exercise Prescription - 10/19/16 1400      Date of Initial Exercise RX and Referring Provider   Date 10/19/16   Referring Provider [name]     Treadmill   MPH 1.7  3/3/3   Grade 0   Minutes 15   METs 2.3     Recumbant Bike   Level 1   RPM 60   Minutes 15   METs 2     REL-XR   Level 1   Speed 60   Minutes 15   METs 2.3     T5 Nustep   Level 1   SPM 80   Minutes 15   METs 2     Prescription Details   Frequency (times per week) 3   Duration Progress to 30 minutes of continuous aerobic without signs/symptoms of physical distress     Intensity   THRR 40-80% of Max Heartrate 109-132   Ratings of Perceived Exertion 11-13   Perceived Dyspnea 0-4     Resistance Training   Training Prescription Yes   Weight 3   Reps 10-15      Discharge Exercise Prescription (Final Exercise Prescription Changes):     Exercise Prescription Changes - 12/30/16 1500      Response to Exercise   Blood Pressure (Admit) 124/64   Blood Pressure (Exercise) 140/50   Blood Pressure (Exit) 126/64   Heart Rate (Admit) 81 bpm   Heart Rate (Exercise) 115 bpm   Heart Rate (Exit) 54 bpm   Rating of Perceived Exertion (Exercise) 12   Symptoms hip  pain on treadmill   Duration Continue with 45 min of aerobic exercise without signs/symptoms of physical distress.   Intensity THRR unchanged     Progression   Progression Continue to progress workloads to maintain intensity without signs/symptoms of physical distress.   Average METs 3.21     Resistance Training   Training Prescription Yes   Weight 4 lbs   Reps 10-15     Interval Training   Interval Training No     Treadmill   MPH 1.7   Grade 1   Minutes 15   METs 2.54     REL-XR   Level 7   Minutes 15   METs 4.9     T5 Nustep   Level 5   Minutes 15   METs 2.2     Home Exercise Plan   Plans to continue exercise at Home (comment)  walking with plans to join [employer] after graduation   Frequency Add 2 additional days to program exercise sessions.   Initial Home Exercises Provided 10/30/16      Functional Capacity:     6 Minute  Walk    Row Name 10/19/16 1406 12/23/16 0839       6 Minute Walk   Phase Initial Discharge    Distance 1200 feet 1365 feet    Distance % Change  - 13.8 %    Distance Feet Change  - 165 ft    Walk Time 6 minutes 6 minutes    # of Rest Breaks 0 0    MPH 2.27 2.59    METS 2.29 3.1    RPE 12 13    VO2 Peak 8.02 10.85    Symptoms Yes (comment) Yes (comment)    Comments hip pain 5/10 hip pain 5/10    Resting HR 87 bpm 75 bpm    Resting BP 148/64 130/58    Max Ex. HR 91 bpm 100 bpm    Max Ex. BP 140/80 196/84    2 Minute Post BP  - 156/80       Psychological, QOL, Others - Outcomes: PHQ 2/9: Depression screen [facility] 2/9 01/01/2017 12/18/2016 10/19/2016 09/15/2016 08/18/2016  Decreased Interest 0 0 0 0 0  Down, Depressed, Hopeless 0 0 0 0 0  PHQ - 2 Score 0 0 0 0 0  Altered sleeping 0 - 0 - -  Tired, decreased energy 0 - 1 - -  Change in appetite 0 - 0 - -  Feeling bad or failure about yourself  0 - 0 - -  Trouble concentrating 0 - 0 - -  Moving slowly or fidgety/restless 0 - 0 - -  Suicidal thoughts 0 - 0 - -  PHQ-9 Score 0 - 1 - -   Difficult doing work/chores Not difficult at all - Not difficult at all - -    Quality of Life:     Quality of Life - 01/01/17 0909      Quality of Life Scores   Health/Function Pre 22.87 %   Health/Function Post 24.03 %   Health/Function % Change 5.07 %   Socioeconomic Pre 23.21 %   Socioeconomic Post 27.21 %   Socioeconomic % Change  17.23 %   Psych/Spiritual Pre 22.5 %   Psych/Spiritual Post 28.07 %   Psych/Spiritual % Change 24.76 %   Family Pre 26.5 %   Family Post 25.2 %   Family % Change -4.91 %   GLOBAL Pre 23.4 %   GLOBAL Post 25.69 %   GLOBAL % Change 9.79 %      Personal Goals: Goals established at orientation with interventions provided to work toward goal.     Personal Goals and Risk Factors at Admission - 10/19/16 1524      Core Components/Risk Factors/Patient Goals on Admission    Weight Management Yes;Weight Maintenance   Admit Weight 210 lb 14.4 oz (95.7 kg)   Goal Weight: Short Term 210 lb (95.3 kg)   Goal Weight: Long Term 210 lb (95.3 kg)   Expected Outcomes Short Term: Continue to assess and modify interventions until short term weight is achieved;Long Term: Adherence to nutrition and physical activity/exercise program aimed toward attainment of established weight goal;Weight Maintenance: Understanding of the daily nutrition guidelines, which includes 25-35% calories from fat, 7% or less cal from saturated fats, less than 276m cholesterol, less than 1.5gm of sodium, & 5 or more servings of fruits and vegetables daily   Intervention Assist the participant in steps to quit. Provide individualized education and counseling about committing to Tobacco Cessation, relapse prevention, and pharmacological support that can be provided by physician.;Offer self-teaching  materials, assist with locating and accessing local/national Quit Smoking programs, and support quit date choice.   Expected Outcomes Short Term: Will demonstrate readiness to quit, by selecting a  quit date.;Short Term: Will quit all tobacco product use, adhering to prevention of relapse plan.;Long Term: Complete abstinence from all tobacco products for at least 12 months from quit date.   Hypertension Yes   Intervention Provide education on lifestyle modifcations including regular physical activity/exercise, weight management, moderate sodium restriction and increased consumption of fresh fruit, vegetables, and low fat dairy, alcohol moderation, and smoking cessation.;Monitor prescription use compliance.   Expected Outcomes Short Term: Continued assessment and intervention until BP is < 140/14m HG in hypertensive participants. < 130/827mHG in hypertensive participants with diabetes, heart failure or chronic kidney disease.;Long Term: Maintenance of blood pressure at goal levels.   Lipids Yes   Intervention Provide education and support for participant on nutrition & aerobic/resistive exercise along with prescribed medications to achieve LDL <7055mHDL >6m9m Expected Outcomes Short Term: Participant states understanding of desired cholesterol values and is compliant with medications prescribed. Participant is following exercise prescription and nutrition guidelines.;Long Term: Cholesterol controlled with medications as prescribed, with individualized exercise RX and with personalized nutrition plan. Value goals: LDL < 70mg52mL > 40 mg.       Personal Goals Discharge:     Goals and Risk Factor Review    Row Name 11/23/16 0839 12/09/16 0835 01/01/17 0833         Core Components/Risk Factors/Patient Goals Review   Personal Goals Review Weight Management/Obesity;Tobacco Cessation;Hypertension;Lipids Weight Management/Obesity;Tobacco Cessation;Hypertension;Lipids Weight Management/Obesity;Tobacco Cessation;Hypertension;Lipids     Review Cace's weight is down some since starting rehab.  He does not have much of an appetite and has lost some weight.  He is watching what he is eating and  cut back on sugar and salt.  His blood pressures have been good.  He says that cutting back on salt and losing weight has helped.  He does not have any problems with his medications other than he misses doses on occasion.  He is still not ready to quit smoking. He has cut back to half a pack a day.  He says that it is really the habit more than the need for nicotine.  He doesn't smoke around other people.  He knows that the only way to quit is to make the decision to quit himself. KenneYarnellinues to do well in rehab.  His weight has stalled out between 204-208 lbs.  He has an appetite but chooses not to eat as he continues to watch his salt intake.    His blood pressures continue to do well.  No problems with his medications.  He is still not ready to quit smoking.  Most days he is still at a pack a day, but some days are a little less.    KenneChazzeeling good overall.  His weight has been stable and he continues to lose.  Blood pressures have been good and his medications seem to working for him.     Expected Outcomes Short: Continue to work on weight loss.  Long: Continue to work on risk factor modification and consider quitting smoking.  Short: Continue to work on weight loss.  Long: Continue to work on risk factor modification and smoking reduction. Short: Graduate  Long: Continue to work on risk factors modfications.         Exercise Goals and Review:     Exercise Goals  [place] Name 10/19/16 1408             Exercise Goals   Increase Physical Activity Yes       Intervention Provide advice, education, support and counseling about physical activity/exercise needs.;Develop an individualized exercise prescription for aerobic and resistive training based on initial evaluation findings, risk stratification, comorbidities and participant's personal goals.       Expected Outcomes Achievement of increased cardiorespiratory fitness and enhanced flexibility, muscular endurance and strength shown  through measurements of functional capacity and personal statement of participant.       Increase Strength and Stamina Yes       Intervention Provide advice, education, support and counseling about physical activity/exercise needs.;Develop an individualized exercise prescription for aerobic and resistive training based on initial evaluation findings, risk stratification, comorbidities and participant's personal goals.       Expected Outcomes Achievement of increased cardiorespiratory fitness and enhanced flexibility, muscular endurance and strength shown through measurements of functional capacity and personal statement of participant.          Nutrition & Weight - Outcomes:     Pre Biometrics - 10/28/16 1501      Pre Biometrics   Waist Circumference 42.5 inches   Hip Circumference 43.5 inches   Waist to Hip Ratio 0.98 %   Single Leg Stand 1.51 seconds         Post Biometrics - 12/23/16 0844       Post  Biometrics   Height 5' 10.75" (1.797 m)   Weight 209 lb 14.4 oz (95.2 kg)   Waist Circumference 44 inches   Hip Circumference 43.5 inches   Waist to Hip Ratio 1.01 %   BMI (Calculated) 29.48   Single Leg Stand 2.21 seconds      Nutrition:     Nutrition Therapy & Goals - 11/23/16 1316      Nutrition Therapy   Diet Instructed on a meal plan based on heart healthy dietary guidelines   Drug/Food Interactions Statins/Certain Fruits   Protein (specify units) 7   Fiber 25 grams   Whole Grain Foods 3 servings   Saturated Fats 12 max. grams   Fruits and Vegetables 5 servings/day   Sodium 1500 grams     Personal Nutrition Goals   Nutrition Goal Try some low sodium products such as [place] low sodium sauces, StarKist very low sodium tuna, Mrs. DASH lemon pepper and taco seasoning, Nathan french fries   Personal Goal #2 Add low sodium sides such as steamed vegetables and fruits to entrees that have more sodium.    Personal Goal #3 Read labels for saturated fat, trans fat and  sodium.   Comments Patient eats out frequently but was interested in simple lower sodium meals that can be prepared at home as well as lower sodium choices when eating "out". Discussed this at length.     Intervention Plan   Intervention Prescribe, educate and counsel regarding individualized specific dietary modifications aiming towards targeted core components such as weight, hypertension, lipid management, diabetes, heart failure and other comorbidities.;Nutrition handout(s) given to patient.   Expected Outcomes Short Term Goal: Understand basic principles of dietary content, such as calories, fat, sodium, cholesterol and nutrients.;Short Term Goal: A plan has been developed with personal nutrition goals set during dietitian appointment.;Long Term Goal: Adherence to prescribed nutrition plan.      Nutrition Discharge:     Nutrition Assessments - 01/01/17 0909      MEDFICTS Scores   Pre Score 61  Post Score 37   Score Difference -24      Education Questionnaire Score:     Knowledge Questionnaire Score - 01/01/17 0909      Knowledge Questionnaire Score   Pre Score 23/28   Post Score 27/28      Goals reviewed with patient; copy given to patient.

## 2017-01-13 DIAGNOSIS — R05 Cough: Secondary | ICD-10-CM | POA: Diagnosis not present

## 2017-01-13 DIAGNOSIS — J309 Allergic rhinitis, unspecified: Secondary | ICD-10-CM | POA: Diagnosis not present

## 2017-01-13 DIAGNOSIS — R490 Dysphonia: Secondary | ICD-10-CM | POA: Diagnosis not present

## 2017-01-13 DIAGNOSIS — H6123 Impacted cerumen, bilateral: Secondary | ICD-10-CM | POA: Diagnosis not present

## 2017-01-13 DIAGNOSIS — F17200 Nicotine dependence, unspecified, uncomplicated: Secondary | ICD-10-CM | POA: Diagnosis not present

## 2017-01-21 ENCOUNTER — Ambulatory Visit: Payer: Medicare Other | Admitting: Urology

## 2017-02-02 DIAGNOSIS — C44519 Basal cell carcinoma of skin of other part of trunk: Secondary | ICD-10-CM | POA: Diagnosis not present

## 2017-02-02 DIAGNOSIS — Z85820 Personal history of malignant melanoma of skin: Secondary | ICD-10-CM | POA: Diagnosis not present

## 2017-02-02 DIAGNOSIS — Z85828 Personal history of other malignant neoplasm of skin: Secondary | ICD-10-CM | POA: Diagnosis not present

## 2017-02-02 DIAGNOSIS — X32XXXA Exposure to sunlight, initial encounter: Secondary | ICD-10-CM | POA: Diagnosis not present

## 2017-02-02 DIAGNOSIS — D2261 Melanocytic nevi of right upper limb, including shoulder: Secondary | ICD-10-CM | POA: Diagnosis not present

## 2017-02-02 DIAGNOSIS — D225 Melanocytic nevi of trunk: Secondary | ICD-10-CM | POA: Diagnosis not present

## 2017-02-02 DIAGNOSIS — L570 Actinic keratosis: Secondary | ICD-10-CM | POA: Diagnosis not present

## 2017-02-02 DIAGNOSIS — D485 Neoplasm of uncertain behavior of skin: Secondary | ICD-10-CM | POA: Diagnosis not present

## 2017-02-04 ENCOUNTER — Encounter: Payer: Self-pay | Admitting: Urology

## 2017-02-04 ENCOUNTER — Ambulatory Visit (INDEPENDENT_AMBULATORY_CARE_PROVIDER_SITE_OTHER): Payer: PPO | Admitting: Urology

## 2017-02-04 VITALS — BP 151/66 | HR 59 | Ht 70.75 in | Wt 209.7 lb

## 2017-02-04 DIAGNOSIS — N400 Enlarged prostate without lower urinary tract symptoms: Secondary | ICD-10-CM

## 2017-02-04 LAB — URINALYSIS, COMPLETE
Bilirubin, UA: NEGATIVE
Glucose, UA: NEGATIVE
Ketones, UA: NEGATIVE
Leukocytes, UA: NEGATIVE
NITRITE UA: NEGATIVE
PH UA: 7.5 (ref 5.0–7.5)
Protein, UA: NEGATIVE
RBC UA: NEGATIVE
SPEC GRAV UA: 1.01 (ref 1.005–1.030)
Urobilinogen, Ur: 0.2 mg/dL (ref 0.2–1.0)

## 2017-02-04 NOTE — Progress Notes (Signed)
02/04/2017 9:58 AM   [name] Mar 18, 1941 [id number]  Referring provider: Tracie Harrier, MD [address] [facility] [place], [place] [address]  Chief Complaint  Patient presents with  . Benign Prostatic Hypertrophy    HPI: The patient is a 76 year old gentleman with a past medical history of BPH on Flomax and finasteride who presents for annual follow-up.  He has no complaints at this time.  His nocturia x1.  He has intermittent weak stream.  He is not bothered by this.  He denies urgency, significant incontinence, or feeling of incomplete bladder emptying.  No UTIs, gross hematuria, or nephrolithiasis over the last year.  Urinalysis today is clear.  He does have a distant history of gross hematuria with a negative workup.  He does have comorbidities including 3 myocardial infarctions and tobacco abuse history.   PMH: Past Medical History:  Diagnosis Date  . Anginal pain ([place])   . Asthma   . Bladder infection, acute 03/2011   "had a whole lot of bleeding from this"  . CHF (congestive heart failure) ([place])   . Coronary artery disease   . High cholesterol   . Hypertension   . 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 Myocardial infarction ([place]) 1996  . NSTEMI (non-ST elevated myocardial infarction) ([place]) 12/14/2011  . Shortness of breath 12/14/2011   "@ rest, lying down, w/exertion"    Surgical History: Past Surgical History:  Procedure Laterality Date  . [place]; ~ 2003; ~ 2008   "total of 3"  . CORONARY STENT INTERVENTION N/A 08/10/2016   Procedure: Coronary Stent Intervention;  Surgeon: [name], MD;  Location: [facility];  Service: Cardiovascular;  Laterality: N/A;  . HERNIA REPAIR  ~ 2001   "abdominal w/mesh implanted"  . LEFT HEART CATH AND CORONARY ANGIOGRAPHY N/A 08/10/2016   Procedure: Left Heart Cath and Coronary Angiography;   Surgeon: [name], MD;  Location: [facility];  Service: Cardiovascular;  Laterality: N/A;  . LEFT HEART CATHETERIZATION WITH CORONARY ANGIOGRAM N/A 12/16/2011   Procedure: LEFT HEART CATHETERIZATION WITH CORONARY ANGIOGRAM;  Surgeon: Minus Breeding, MD;  Location: [facility];  Service: Cardiovascular;  Laterality: N/A;  . PERCUTANEOUS CORONARY STENT INTERVENTION (PCI-S) N/A 12/17/2011   Procedure: PERCUTANEOUS CORONARY STENT INTERVENTION (PCI-S);  Surgeon: Sherren Mocha, MD;  Location: [facility] CATH LAB;  Service: Cardiovascular;  Laterality: N/A;  . TONSILLECTOMY     "I was a kid"    Home Medications:  Allergies as of 02/04/2017      Reactions   Iodinated Diagnostic Agents Shortness Of Breath   Iodinated Glycerol  [glycerol, Iodinated] Shortness Of Breath      Medication List       Accurate as of 02/04/17  9:58 AM. Always use your most recent med list.          amLODipine-benazepril 10-40 MG capsule Commonly known as:  LOTREL TAKE 1 CAPSULE BY MOUTH EVERY DAY   aspirin 325 MG tablet Take 325 mg by mouth 2 (two) times daily. Patient takes if there is any pain or burning in the chest.   atorvastatin 40 MG tablet Commonly known as:  LIPITOR Take 1 tablet (40 mg total) by mouth daily at 6 PM.   carvedilol 3.125 MG tablet Commonly known as:  COREG Take 3.125 mg by mouth 2 (two) times daily with a meal.   clobetasol cream  0.05 % Commonly known as:  TEMOVATE APPLY TO PSORIASIS BID PRN UNTIL SMOOTH   clopidogrel 75 MG tablet Commonly known as:  PLAVIX TAKE 1 TABLET(75 MG) BY MOUTH EVERY DAY   cyanocobalamin 1000 MCG tablet Take 1,000 mcg by mouth daily.   finasteride 5 MG tablet Commonly known as:  PROSCAR Take 5 mg by mouth daily.   furosemide 20 MG tablet Commonly known as:  LASIX Take 1 tablet (20 mg total) by mouth daily.   GLUCOSAMINE-CHONDROITIN-VIT C PO Take 1 tablet by mouth daily.   hydrochlorothiazide 12.5 MG tablet Commonly known as:   HYDRODIURIL Take 12.5 mg by mouth daily.   isosorbide mononitrate 30 MG 24 hr tablet Commonly known as:  IMDUR Take 30 mg by mouth daily.   omeprazole 20 MG capsule Commonly known as:  PRILOSEC Take by mouth.   spironolactone 25 MG tablet Commonly known as:  ALDACTONE Take 0.5 tablets by mouth daily.   tamsulosin 0.4 MG Caps capsule Commonly known as:  FLOMAX TAKE 1 CAPSULE BY MOUTH EVERY DAY   WAL-ZYR 10 MG tablet Generic drug:  cetirizine Take 10 mg by mouth.       Allergies:  Allergies  Allergen Reactions  . Iodinated Diagnostic Agents Shortness Of Breath  . Iodinated Glycerol  [Glycerol, Iodinated] Shortness Of Breath    Family History: Family History  Problem Relation Age of Onset  . Family history unknown: Yes    Social History:  reports that he has been smoking Cigarettes.  He has a 0.25 pack-year smoking history. He has never used smokeless tobacco. He reports that he drinks about 0.6 oz of alcohol per week . He reports that he does not use drugs.  ROS: UROLOGY Frequent Urination?: No Hard to postpone urination?: No Burning/pain with urination?: No Get up at night to urinate?: Yes Leakage of urine?: No Urine stream starts and stops?: No Trouble starting stream?: No Do you have to strain to urinate?: No Blood in urine?: No Urinary tract infection?: No Sexually transmitted disease?: No Injury to kidneys or bladder?: No Painful intercourse?: No Weak stream?: Yes Erection problems?: No Penile pain?: No  Gastrointestinal Nausea?: No Vomiting?: No Indigestion/heartburn?: No Diarrhea?: No Constipation?: No  Constitutional Fever: No Night sweats?: No Weight loss?: No Fatigue?: No  Skin Skin rash/lesions?: No Itching?: No  Eyes Blurred vision?: No Double vision?: No  Ears/Nose/Throat Sore throat?: No Sinus problems?: No  Hematologic/Lymphatic Swollen glands?: No Easy bruising?: No  Cardiovascular Leg swelling?: No Chest pain?:  No  Respiratory Cough?: No Shortness of breath?: No  Endocrine Excessive thirst?: No  Musculoskeletal Back pain?: No Joint pain?: No  Neurological Headaches?: No Dizziness?: No  Psychologic Depression?: No Anxiety?: No  Physical Exam: BP (!) 151/66 (BP Location: Right Arm, Patient Position: Sitting, Cuff Size: Normal)   Pulse (!) 59   Ht 5' 10.75" (1.797 m)   Wt 209 lb 11.2 oz (95.1 kg)   BMI 29.45 kg/m   Constitutional:  Alert and oriented, No acute distress. HEENT: [place] AT, moist mucus membranes.  Trachea midline, no masses. Cardiovascular: No clubbing, cyanosis, or edema. Respiratory: Normal respiratory effort, no increased work of breathing. GI: Abdomen is soft, nontender, nondistended, no abdominal masses GU: No CVA tenderness.  Skin: No rashes, bruises or suspicious lesions. Lymph: No cervical or inguinal adenopathy. Neurologic: Grossly intact, no focal deficits, moving all 4 extremities. Psychiatric: Normal mood and affect.  Laboratory Data: Lab Results  Component Value Date   WBC 6.4 09/07/2016  HGB 11.0 (L) 09/07/2016   HCT 33.3 (L) 09/07/2016   MCV 80.6 09/07/2016   PLT 166 09/07/2016    Lab Results  Component Value Date   CREATININE 1.56 (H) 12/18/2016    No results found for: PSA  No results found for: TESTOSTERONE  No results found for: HGBA1C  Urinalysis    Component Value Date/Time   COLORURINE YELLOW (A) 06/20/2015 1626   APPEARANCEUR Clear 01/23/2016 1006   LABSPEC 1.011 06/20/2015 1626   PHURINE 7.0 06/20/2015 1626   GLUCOSEU Negative 01/23/2016 1006   HGBUR NEGATIVE 06/20/2015 1626   BILIRUBINUR Negative 01/23/2016 1006   KETONESUR NEGATIVE 06/20/2015 1626   PROTEINUR 2+ (A) 01/23/2016 1006   PROTEINUR 100 (A) 06/20/2015 1626   UROBILINOGEN 0.2 04/06/2011 0900   NITRITE Negative 01/23/2016 1006   NITRITE NEGATIVE 06/20/2015 1626   LEUKOCYTESUR Negative 01/23/2016 1006    Assessment & Plan:    1. BPH Continue Flomax  and finasteride  Return in about 1 year (around 02/04/2018).  Nickie Retort, MD  [facility] [address] [place], [place] [address] [phone]

## 2017-03-26 DIAGNOSIS — C44519 Basal cell carcinoma of skin of other part of trunk: Secondary | ICD-10-CM | POA: Diagnosis not present

## 2017-03-30 DIAGNOSIS — I10 Essential (primary) hypertension: Secondary | ICD-10-CM | POA: Diagnosis not present

## 2017-03-30 DIAGNOSIS — F17200 Nicotine dependence, unspecified, uncomplicated: Secondary | ICD-10-CM | POA: Diagnosis not present

## 2017-03-30 DIAGNOSIS — R0609 Other forms of dyspnea: Secondary | ICD-10-CM | POA: Diagnosis not present

## 2017-03-30 DIAGNOSIS — Z23 Encounter for immunization: Secondary | ICD-10-CM | POA: Diagnosis not present

## 2017-03-30 DIAGNOSIS — E785 Hyperlipidemia, unspecified: Secondary | ICD-10-CM | POA: Diagnosis not present

## 2017-03-30 DIAGNOSIS — Z9889 Other specified postprocedural states: Secondary | ICD-10-CM | POA: Diagnosis not present

## 2017-03-30 DIAGNOSIS — I739 Peripheral vascular disease, unspecified: Secondary | ICD-10-CM | POA: Diagnosis not present

## 2017-03-30 DIAGNOSIS — I5022 Chronic systolic (congestive) heart failure: Secondary | ICD-10-CM | POA: Diagnosis not present

## 2017-03-30 DIAGNOSIS — G4733 Obstructive sleep apnea (adult) (pediatric): Secondary | ICD-10-CM | POA: Diagnosis not present

## 2017-03-30 DIAGNOSIS — J449 Chronic obstructive pulmonary disease, unspecified: Secondary | ICD-10-CM | POA: Diagnosis not present

## 2017-03-30 DIAGNOSIS — R002 Palpitations: Secondary | ICD-10-CM | POA: Diagnosis not present

## 2017-03-30 DIAGNOSIS — E669 Obesity, unspecified: Secondary | ICD-10-CM | POA: Diagnosis not present

## 2017-03-30 DIAGNOSIS — I2510 Atherosclerotic heart disease of native coronary artery without angina pectoris: Secondary | ICD-10-CM | POA: Diagnosis not present

## 2017-04-12 DIAGNOSIS — R490 Dysphonia: Secondary | ICD-10-CM | POA: Diagnosis not present

## 2017-04-12 DIAGNOSIS — R0982 Postnasal drip: Secondary | ICD-10-CM | POA: Diagnosis not present

## 2017-04-12 DIAGNOSIS — Z0000 Encounter for general adult medical examination without abnormal findings: Secondary | ICD-10-CM | POA: Diagnosis not present

## 2017-04-12 DIAGNOSIS — I2510 Atherosclerotic heart disease of native coronary artery without angina pectoris: Secondary | ICD-10-CM | POA: Diagnosis not present

## 2017-04-12 DIAGNOSIS — Z720 Tobacco use: Secondary | ICD-10-CM | POA: Diagnosis not present

## 2017-04-12 DIAGNOSIS — I5022 Chronic systolic (congestive) heart failure: Secondary | ICD-10-CM | POA: Diagnosis not present

## 2017-04-19 DIAGNOSIS — E7800 Pure hypercholesterolemia, unspecified: Secondary | ICD-10-CM | POA: Diagnosis not present

## 2017-04-19 DIAGNOSIS — Z720 Tobacco use: Secondary | ICD-10-CM | POA: Diagnosis not present

## 2017-04-19 DIAGNOSIS — I2510 Atherosclerotic heart disease of native coronary artery without angina pectoris: Secondary | ICD-10-CM | POA: Diagnosis not present

## 2017-04-19 DIAGNOSIS — L989 Disorder of the skin and subcutaneous tissue, unspecified: Secondary | ICD-10-CM | POA: Diagnosis not present

## 2017-04-19 DIAGNOSIS — Z23 Encounter for immunization: Secondary | ICD-10-CM | POA: Diagnosis not present

## 2017-04-19 DIAGNOSIS — I10 Essential (primary) hypertension: Secondary | ICD-10-CM | POA: Diagnosis not present

## 2017-04-19 DIAGNOSIS — M150 Primary generalized (osteo)arthritis: Secondary | ICD-10-CM | POA: Diagnosis not present

## 2017-04-21 DIAGNOSIS — R58 Hemorrhage, not elsewhere classified: Secondary | ICD-10-CM | POA: Diagnosis not present

## 2017-04-21 DIAGNOSIS — L400 Psoriasis vulgaris: Secondary | ICD-10-CM | POA: Diagnosis not present

## 2017-04-21 DIAGNOSIS — L538 Other specified erythematous conditions: Secondary | ICD-10-CM | POA: Diagnosis not present

## 2017-04-21 DIAGNOSIS — L820 Inflamed seborrheic keratosis: Secondary | ICD-10-CM | POA: Diagnosis not present

## 2017-06-16 NOTE — Progress Notes (Deleted)
Patient ID: Peter [name], male    DOB: 1940-08-10, 77 y.o.   MRN: [id number]  HPI  Mr Geraldo is a 77 y/o male with a history of asthma, CAD, hyperlipidemia, HTN, hypokalemia, MI, NSTEMI, multiple stents, current tobacco use and chronic heart failure.   Echo report done 10/01/16 reviewed and shows an EF of 35% along with moderate MR/TR. Reviewed echo report on 08/08/16 which showed an EF of 35-40% along with mild AR and moderate MR/TR. EF has declined from a previous echo done 12/15/11. Cardiac catheterization done 08/10/16 showed one vessel CAD with high-grade in stent restenosis in the proximal LAD. DES successfully placed in proximal LAD.  Has not been admitted or been in the ED in the last 6 months.    He presents today for a follow-up visit today with a chief complaint of   Past Medical History:  Diagnosis Date  . Anginal pain ([place])   . Asthma   . Bladder infection, acute 03/2011   "had a whole lot of bleeding from this"  . CHF (congestive heart failure) ([place])   . Coronary artery disease   . High cholesterol   . Hypertension   . 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 Myocardial infarction ([place]) 1996  . NSTEMI (non-ST elevated myocardial infarction) ([place]) 12/14/2011  . Shortness of breath 12/14/2011   "@ rest, lying down, w/exertion"   Past Surgical History:  Procedure Laterality Date  . [place]; ~ 2003; ~ 2008   "total of 3"  . CORONARY STENT INTERVENTION N/A 08/10/2016   Procedure: Coronary Stent Intervention;  Surgeon: [name], MD;  Location: [place] [facility];  Service: Cardiovascular;  Laterality: N/A;  . HERNIA REPAIR  ~ 2001   "abdominal w/mesh implanted"  . LEFT HEART CATH AND CORONARY ANGIOGRAPHY N/A 08/10/2016   Procedure: Left Heart Cath and Coronary Angiography;  Surgeon: [name], MD;  Location: [place] [facility];  Service: Cardiovascular;  Laterality:  N/A;  . LEFT HEART CATHETERIZATION WITH CORONARY ANGIOGRAM N/A 12/16/2011   Procedure: LEFT HEART CATHETERIZATION WITH CORONARY ANGIOGRAM;  Surgeon: Minus Breeding, MD;  Location: [facility] CATH LAB;  Service: Cardiovascular;  Laterality: N/A;  . PERCUTANEOUS CORONARY STENT INTERVENTION (PCI-S) N/A 12/17/2011   Procedure: PERCUTANEOUS CORONARY STENT INTERVENTION (PCI-S);  Surgeon: Sherren Mocha, MD;  Location: [facility] CATH LAB;  Service: Cardiovascular;  Laterality: N/A;  . TONSILLECTOMY     "I was a kid"   Family History  Family history unknown: Yes   Social History   Tobacco Use  . Smoking status: Current Every Day Smoker    Packs/day: 0.50    Years: 0.50    Pack years: 0.25    Types: Cigarettes  . Smokeless tobacco: Never Used  . Tobacco comment: 11/23/16 Still not ready to quit smoking.  Substance Use Topics  . Alcohol use: Yes    Alcohol/week: 0.6 oz    Types: 1 Cans of beer per week    Comment: 12/14/2011 "if my kidneys are sore, I drink 1 beer/day; if not sore; no beer; occasionally drink socially; ave 1 beer/wk maybe"   Allergies  Allergen Reactions  . Iodinated Diagnostic Agents Shortness Of Breath  . Iodinated Glycerol  [Glycerol, Iodinated] Shortness Of Breath    Review of Systems  Constitutional: Positive for fatigue (minimal). Negative for appetite change.  HENT: Positive for congestion. Negative for rhinorrhea and sore throat.  Eyes: Negative.   Respiratory: Positive for shortness of breath (occasionally). Negative for cough and chest tightness.   Cardiovascular: Negative for chest pain, palpitations and leg swelling.  Gastrointestinal: Negative for abdominal distention and abdominal pain.  Endocrine: Negative.   Genitourinary: Negative.   Musculoskeletal: Negative for back pain and neck pain.  Skin: Negative.   Allergic/Immunologic: Negative.   Neurological: Positive for dizziness and light-headedness.  Hematological: Negative for adenopathy. Bruises/bleeds easily.   Psychiatric/Behavioral: Negative for dysphoric mood and sleep disturbance (sleeping well). The patient is not nervous/anxious.     Physical Exam  Constitutional: He is oriented to person, place, and time. He appears well-developed and well-nourished.  HENT:  Head: Normocephalic and atraumatic.  Neck: Normal range of motion. Neck supple. No JVD present.  Cardiovascular: Regular rhythm. Bradycardia present.  Pulmonary/Chest: Effort normal. He has no wheezes. He has no rales.  Abdominal: Soft. He exhibits no distension. There is no tenderness.  Musculoskeletal: He exhibits no edema or tenderness.  Neurological: He is alert and oriented to person, place, and time.  Skin: Skin is warm and dry.  Psychiatric: He has a normal mood and affect. His behavior is normal. Thought content normal.  Nursing note and vitals reviewed.  Assessment & Plan:  1: Chronic heart failure with reduced ejection fraction- - NYHA class II - euvolemic today - weighing daily. Weight down 6 pounds since he was last here. Instructed to call for an overnight weight gain of >2 pounds or a weekly weight gain of >5 pounds - not adding salt and has been reading food labels. Reviewed the importance of closely following a 2000mg  sodium diet - isn't interested in entresto at this time; will continue to discuss with him - saw cardiologist (Dr. Clayborn Bigness) 10/06/16  -  -   2: HTN- - BP looks good today - saw PCP (Hande) 12/07/16 - BMP on 12/18/16 reviewed and showed sodium 140, potassium 4.0 and GFR 41  3: Tobacco use- - smoking 1 ppd of cigarettes - Complete cessation discussed for 3 minutes with him   Patient did not bring his medications nor a list. Each medication was verbally reviewed with the patient and he was encouraged to bring the bottles to every visit to confirm accuracy of list

## 2017-06-17 ENCOUNTER — Telehealth: Payer: Self-pay | Admitting: Family

## 2017-06-17 ENCOUNTER — Ambulatory Visit: Payer: PPO | Admitting: Family

## 2017-06-17 NOTE — Telephone Encounter (Signed)
Patient did not show for his Heart Failure Clinic appointment on 06/17/17. Will attempt to reschedule.

## 2017-06-17 NOTE — Telephone Encounter (Deleted)
Patient did not show for his Heart Failure Clinic appointment on 06/17/17. Will attempt to reschedule.   This is the 3rd appointment that he has missed.

## 2017-06-21 NOTE — Progress Notes (Signed)
Patient ID: Peter [name], male    DOB: 09-27-1940, 77 y.o.   MRN: [id number]  HPI  Peter [name] is a 77 y/o male with a history of asthma, CAD, hyperlipidemia, HTN, hypokalemia, MI, NSTEMI, multiple stents, current tobacco use and chronic heart failure.   Echo report done 10/01/16 reviewed and shows an EF of 35% along with moderate Peter/TR. Reviewed echo report on 08/08/16 which showed an EF of 35-40% along with mild AR and moderate Peter/TR. EF has declined from a previous echo done 12/15/11. Cardiac catheterization done 08/10/16 showed one vessel CAD with high-grade in stent restenosis in the proximal LAD. DES successfully placed in proximal LAD.  Has not been admitted or been in the ED in the last 6 months.    He presents today for a follow-up visit today with a chief complaint of minimal shortness of breath upon moderate exertion. He describes this as chronic in nature having been present for several years with varying levels of severity. He has associated fatigue, head congestion and gradual weight gain after eating [place]. He denies any difficulty sleeping, abdominal distention, palpitations, edema, chest pain, dizziness or cough.   Past Medical History:  Diagnosis Date  . Anginal pain ([place])   . Asthma   . Bladder infection, acute 03/2011   "had a whole lot of bleeding from this"  . CHF (congestive heart failure) ([place])   . Coronary artery disease   . High cholesterol   . Hypertension   . 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 Myocardial infarction ([place]) 1996  . NSTEMI (non-ST elevated myocardial infarction) ([place]) 12/14/2011  . Shortness of breath 12/14/2011   "@ rest, lying down, w/exertion"   Past Surgical History:  Procedure Laterality Date  . [place]; ~ 2003; ~ 2008   "total of 3"  . CORONARY STENT INTERVENTION N/A 08/10/2016   Procedure: Coronary Stent Intervention;  Surgeon: [name], MD;  Location: [facility];  Service: Cardiovascular;  Laterality: N/A;  . HERNIA REPAIR  ~ 2001   "abdominal w/mesh implanted"  . LEFT HEART CATH AND CORONARY ANGIOGRAPHY N/A 08/10/2016   Procedure: Left Heart Cath and Coronary Angiography;  Surgeon: [name], MD;  Location: [place] [facility];  Service: Cardiovascular;  Laterality: N/A;  . LEFT HEART CATHETERIZATION WITH CORONARY ANGIOGRAM N/A 12/16/2011   Procedure: LEFT HEART CATHETERIZATION WITH CORONARY ANGIOGRAM;  Surgeon: Minus Breeding, MD;  Location: [facility] CATH LAB;  Service: Cardiovascular;  Laterality: N/A;  . PERCUTANEOUS CORONARY STENT INTERVENTION (PCI-S) N/A 12/17/2011   Procedure: PERCUTANEOUS CORONARY STENT INTERVENTION (PCI-S);  Surgeon: Sherren Mocha, MD;  Location[facility] C[facility] LAB;  Service: Cardiovascular;  Laterality: N/A;  . TONSILLECTOMY     "I was a kid"   Family History  Family history unknown: Yes   Social History   Tobacco Use  . Smoking status: Current Every Day Smoker    Packs/day: 0.50    Years: 0.50    Pack years: 0.25    Types: Cigarettes  . Smokeless tobacco: Never Used  . Tobacco comment: 11/23/16 Still not ready to quit smoking.  Substance Use Topics  . Alcohol use: Yes    Alcohol/week: 0.6 oz    Types: 1 Cans of beer per week    Comment: 12/14/2011 "if my kidneys are sore, I drink 1 beer/day; if not sore; no beer; occasionally drink socially; ave 1 beer/wk  maybe"   Allergies  Allergen Reactions  . Iodinated Diagnostic Agents Shortness Of Breath  . Iodinated Glycerol  [Glycerol, Iodinated] Shortness Of Breath   Prior to Admission medications   Medication Sig Start Date End Date Taking? Authorizing Provider  amLODipine-benazepril (LOTREL) 10-40 MG capsule TAKE 1 CAPSULE BY MOUTH EVERY DAY 10/21/15  Yes [provider]  aspirin 325 MG tablet Take 325 mg by mouth 2 (two) times daily. Patient takes if there is any pain or burning in the chest.   Yes [provider]  atorvastatin (LIPITOR) 40 MG tablet Take 1 tablet (40 mg total) by mouth daily at 6 PM. 12/18/11  Yes Annita Brod, MD  carvedilol (COREG) 3.125 MG tablet Take 3.125 mg by mouth 2 (two) times daily with a meal.   Yes [provider]  cetirizine (WAL-ZYR) 10 MG tablet Take 10 mg by mouth.   Yes [provider]  clobetasol cream (TEMOVATE) 0.05 % APPLY TO PSORIASIS BID PRN UNTIL SMOOTH 11/05/14  Yes [provider]  clopidogrel (PLAVIX) 75 MG tablet TAKE 1 TABLET(75 MG) BY MOUTH EVERY DAY 08/11/16  Yes Fritzi Mandes, MD  cyanocobalamin 1000 MCG tablet Take 1,000 mcg by mouth daily.    Yes [provider]  finasteride (PROSCAR) 5 MG tablet Take 5 mg by mouth daily.   Yes [provider]  furosemide (LASIX) 20 MG tablet Take 1 tablet (20 mg total) by mouth daily. Patient taking differently: Take 20 mg by mouth 2 (two) times daily.  09/10/16 09/10/17 Yes Epifanio Lesches, MD  GLUCOSAMINE-CHONDROITIN-VIT C PO Take 1 tablet by mouth daily.   Yes [provider]  hydrochlorothiazide (HYDRODIURIL) 12.5 MG tablet Take 12.5 mg by mouth daily.  04/22/15  Yes [provider]  isosorbide mononitrate (IMDUR) 30 MG 24 hr tablet Take 30 mg by mouth daily. 06/18/16  Yes [provider]  omeprazole (PRILOSEC) 20 MG capsule Take by mouth. 01/16/16  Yes [provider]  spironolactone (ALDACTONE) 25 MG tablet Take 0.5 tablets by mouth daily. 10/06/16 10/06/17 Yes [provider]  tamsulosin (FLOMAX) 0.4 MG CAPS capsule TAKE 1 CAPSULE BY MOUTH EVERY DAY 10/28/15  Yes [provider]    Review of Systems  Constitutional: Positive for fatigue (minimal). Negative for appetite change.  HENT: Positive for congestion. Negative for rhinorrhea and sore throat.   Eyes: Negative.   Respiratory: Positive for shortness of breath (occasionally). Negative for cough and chest tightness.   Cardiovascular: Negative for chest  pain, palpitations and leg swelling.  Gastrointestinal: Negative for abdominal distention and abdominal pain.  Endocrine: Negative.   Genitourinary: Negative.   Musculoskeletal: Negative for back pain and neck pain.  Skin: Negative.   Allergic/Immunologic: Negative.   Neurological: Negative for dizziness and light-headedness.  Hematological: Negative for adenopathy. Bruises/bleeds easily.  Psychiatric/Behavioral: Negative for dysphoric mood and sleep disturbance (sleeping well). The patient is not nervous/anxious.    Vitals:   06/22/17 1218  BP: (!) 125/56  Pulse: 81  Resp: 18  SpO2: 95%  Weight: 217 lb 4 oz (98.5 kg)  Height: 5\' 11"  (1.803 m)   Wt Readings from Last 3 Encounters:  06/22/17 217 lb 4 oz (98.5 kg)  02/04/17 209 lb 11.2 oz (95.1 kg)  12/23/16 209 lb 14.4 oz (95.2 kg)   Lab Results  Component Value Date   CREATININE 1.56 (H) 12/18/2016   CREATININE 1.52 (H) 09/10/2016   CREATININE 1.51 (H) 09/09/2016    Physical Exam  Constitutional:  He is oriented to person, place, and time. He appears well-developed and well-nourished.  HENT:  Head: Normocephalic and atraumatic.  Neck: Normal range of motion. Neck supple. No JVD present.  Cardiovascular: Normal rate and regular rhythm.  Pulmonary/Chest: Effort normal. He has no wheezes. He has no rales.  Abdominal: Soft. He exhibits no distension. There is no tenderness.  Musculoskeletal: He exhibits no edema or tenderness.  Neurological: He is alert and oriented to person, place, and time.  Skin: Skin is warm and dry.  Psychiatric: He has a normal mood and affect. His behavior is normal. Thought content normal.  Nursing note and vitals reviewed.  Assessment & Plan:  1: Chronic heart failure with reduced ejection fraction- - NYHA class II - euvolemic today - weighing daily. Reminded to call for an overnight weight gain of >2 pounds or a weekly weight gain of >5 pounds - weight up 6 pounds since he was last here  September 2018 - says that he ate [place] food the other night and gained 4 pounds overnight and he has now dropped 2 pounds back down - not adding salt and has been reading food labels. Reviewed the importance of closely following a 2000mg  sodium diet - isn't interested in entresto  - saw cardiologist (Dr. Clayborn Bigness) 03/30/17 and returns in 6 months (June 2019) - received flu vaccine for this season  2: HTN- - BP looks good today - saw PCP (Peter [name]) 04/19/17 - BMP on  04/12/17 [facility]) reviewed and showed sodium 141, potassium 4.0 and GFR 45  3: Tobacco use- - smoking 1 ppd of cigarettes and he's not interested in stopping - Complete cessation discussed for 3 minutes with him   Patient did not bring his medications nor a list. Each medication was verbally reviewed with the patient and he was encouraged to bring the bottles to every visit to confirm accuracy of list  Patient opts to not make a return appointment at this time. He was advised that he could call back at any point in the future for another appointment.

## 2017-06-22 ENCOUNTER — Encounter: Payer: Self-pay | Admitting: Family

## 2017-06-22 ENCOUNTER — Ambulatory Visit: Payer: PPO | Attending: Family | Admitting: Family

## 2017-06-22 VITALS — BP 125/56 | HR 81 | Resp 18 | Ht 71.0 in | Wt 217.2 lb

## 2017-06-22 DIAGNOSIS — I509 Heart failure, unspecified: Secondary | ICD-10-CM | POA: Insufficient documentation

## 2017-06-22 DIAGNOSIS — I2510 Atherosclerotic heart disease of native coronary artery without angina pectoris: Secondary | ICD-10-CM | POA: Diagnosis not present

## 2017-06-22 DIAGNOSIS — I110 Hypertensive heart disease with heart failure: Secondary | ICD-10-CM | POA: Diagnosis not present

## 2017-06-22 DIAGNOSIS — E876 Hypokalemia: Secondary | ICD-10-CM | POA: Insufficient documentation

## 2017-06-22 DIAGNOSIS — Z955 Presence of coronary angioplasty implant and graft: Secondary | ICD-10-CM | POA: Diagnosis not present

## 2017-06-22 DIAGNOSIS — Z9889 Other specified postprocedural states: Secondary | ICD-10-CM | POA: Insufficient documentation

## 2017-06-22 DIAGNOSIS — J45909 Unspecified asthma, uncomplicated: Secondary | ICD-10-CM | POA: Insufficient documentation

## 2017-06-22 DIAGNOSIS — Z79899 Other long term (current) drug therapy: Secondary | ICD-10-CM | POA: Diagnosis not present

## 2017-06-22 DIAGNOSIS — F17210 Nicotine dependence, cigarettes, uncomplicated: Secondary | ICD-10-CM | POA: Diagnosis not present

## 2017-06-22 DIAGNOSIS — I252 Old myocardial infarction: Secondary | ICD-10-CM | POA: Diagnosis not present

## 2017-06-22 DIAGNOSIS — Z7982 Long term (current) use of aspirin: Secondary | ICD-10-CM | POA: Insufficient documentation

## 2017-06-22 DIAGNOSIS — I10 Essential (primary) hypertension: Secondary | ICD-10-CM

## 2017-06-22 DIAGNOSIS — Z720 Tobacco use: Secondary | ICD-10-CM

## 2017-06-22 DIAGNOSIS — I5022 Chronic systolic (congestive) heart failure: Secondary | ICD-10-CM

## 2017-06-22 DIAGNOSIS — R0602 Shortness of breath: Secondary | ICD-10-CM | POA: Diagnosis present

## 2017-06-22 NOTE — Patient Instructions (Signed)
Continue weighing daily and call for an overnight weight gain of > 2 pounds or a weekly weight gain of >5 pounds. 

## 2017-07-02 DIAGNOSIS — Z85820 Personal history of malignant melanoma of skin: Secondary | ICD-10-CM | POA: Diagnosis not present

## 2017-07-02 DIAGNOSIS — D2261 Melanocytic nevi of right upper limb, including shoulder: Secondary | ICD-10-CM | POA: Diagnosis not present

## 2017-07-02 DIAGNOSIS — L820 Inflamed seborrheic keratosis: Secondary | ICD-10-CM | POA: Diagnosis not present

## 2017-07-02 DIAGNOSIS — D225 Melanocytic nevi of trunk: Secondary | ICD-10-CM | POA: Diagnosis not present

## 2017-07-02 DIAGNOSIS — Z85828 Personal history of other malignant neoplasm of skin: Secondary | ICD-10-CM | POA: Diagnosis not present

## 2017-07-02 DIAGNOSIS — L538 Other specified erythematous conditions: Secondary | ICD-10-CM | POA: Diagnosis not present

## 2017-07-20 DIAGNOSIS — H353232 Exudative age-related macular degeneration, bilateral, with inactive choroidal neovascularization: Secondary | ICD-10-CM | POA: Diagnosis not present

## 2017-10-05 DIAGNOSIS — I739 Peripheral vascular disease, unspecified: Secondary | ICD-10-CM | POA: Diagnosis not present

## 2017-10-05 DIAGNOSIS — I5022 Chronic systolic (congestive) heart failure: Secondary | ICD-10-CM | POA: Diagnosis not present

## 2017-10-05 DIAGNOSIS — R002 Palpitations: Secondary | ICD-10-CM | POA: Diagnosis not present

## 2017-10-05 DIAGNOSIS — G4733 Obstructive sleep apnea (adult) (pediatric): Secondary | ICD-10-CM | POA: Diagnosis not present

## 2017-10-05 DIAGNOSIS — E785 Hyperlipidemia, unspecified: Secondary | ICD-10-CM | POA: Diagnosis not present

## 2017-10-05 DIAGNOSIS — I429 Cardiomyopathy, unspecified: Secondary | ICD-10-CM | POA: Diagnosis not present

## 2017-10-05 DIAGNOSIS — Z9889 Other specified postprocedural states: Secondary | ICD-10-CM | POA: Diagnosis not present

## 2017-10-05 DIAGNOSIS — I10 Essential (primary) hypertension: Secondary | ICD-10-CM | POA: Diagnosis not present

## 2017-10-05 DIAGNOSIS — I2510 Atherosclerotic heart disease of native coronary artery without angina pectoris: Secondary | ICD-10-CM | POA: Diagnosis not present

## 2017-10-05 DIAGNOSIS — E669 Obesity, unspecified: Secondary | ICD-10-CM | POA: Diagnosis not present

## 2017-10-05 DIAGNOSIS — Z7689 Persons encountering health services in other specified circumstances: Secondary | ICD-10-CM | POA: Diagnosis not present

## 2017-10-05 DIAGNOSIS — R0609 Other forms of dyspnea: Secondary | ICD-10-CM | POA: Diagnosis not present

## 2017-10-26 DIAGNOSIS — I5022 Chronic systolic (congestive) heart failure: Secondary | ICD-10-CM | POA: Diagnosis not present

## 2017-10-26 DIAGNOSIS — I429 Cardiomyopathy, unspecified: Secondary | ICD-10-CM | POA: Diagnosis not present

## 2017-10-29 DIAGNOSIS — F17200 Nicotine dependence, unspecified, uncomplicated: Secondary | ICD-10-CM | POA: Diagnosis not present

## 2017-10-29 DIAGNOSIS — I2510 Atherosclerotic heart disease of native coronary artery without angina pectoris: Secondary | ICD-10-CM | POA: Diagnosis not present

## 2017-10-29 DIAGNOSIS — I5022 Chronic systolic (congestive) heart failure: Secondary | ICD-10-CM | POA: Diagnosis not present

## 2017-11-25 DIAGNOSIS — I716 Thoracoabdominal aortic aneurysm, without rupture: Secondary | ICD-10-CM | POA: Diagnosis not present

## 2017-11-25 DIAGNOSIS — Z006 Encounter for examination for normal comparison and control in clinical research program: Secondary | ICD-10-CM | POA: Diagnosis not present

## 2017-11-25 DIAGNOSIS — Z95828 Presence of other vascular implants and grafts: Secondary | ICD-10-CM | POA: Diagnosis not present

## 2017-12-01 DIAGNOSIS — Z720 Tobacco use: Secondary | ICD-10-CM | POA: Diagnosis not present

## 2017-12-01 DIAGNOSIS — I10 Essential (primary) hypertension: Secondary | ICD-10-CM | POA: Diagnosis not present

## 2017-12-01 DIAGNOSIS — Z125 Encounter for screening for malignant neoplasm of prostate: Secondary | ICD-10-CM | POA: Diagnosis not present

## 2017-12-01 DIAGNOSIS — L989 Disorder of the skin and subcutaneous tissue, unspecified: Secondary | ICD-10-CM | POA: Diagnosis not present

## 2017-12-01 DIAGNOSIS — E7800 Pure hypercholesterolemia, unspecified: Secondary | ICD-10-CM | POA: Diagnosis not present

## 2017-12-01 DIAGNOSIS — I2510 Atherosclerotic heart disease of native coronary artery without angina pectoris: Secondary | ICD-10-CM | POA: Diagnosis not present

## 2017-12-01 DIAGNOSIS — M150 Primary generalized (osteo)arthritis: Secondary | ICD-10-CM | POA: Diagnosis not present

## 2017-12-04 DIAGNOSIS — I716 Thoracoabdominal aortic aneurysm, without rupture: Secondary | ICD-10-CM | POA: Diagnosis not present

## 2017-12-04 DIAGNOSIS — Z006 Encounter for examination for normal comparison and control in clinical research program: Secondary | ICD-10-CM | POA: Diagnosis not present

## 2017-12-04 DIAGNOSIS — R918 Other nonspecific abnormal finding of lung field: Secondary | ICD-10-CM | POA: Diagnosis not present

## 2017-12-08 DIAGNOSIS — D649 Anemia, unspecified: Secondary | ICD-10-CM | POA: Diagnosis not present

## 2017-12-08 DIAGNOSIS — Z0000 Encounter for general adult medical examination without abnormal findings: Secondary | ICD-10-CM | POA: Diagnosis not present

## 2017-12-08 DIAGNOSIS — Z720 Tobacco use: Secondary | ICD-10-CM | POA: Diagnosis not present

## 2017-12-08 DIAGNOSIS — I712 Thoracic aortic aneurysm, without rupture: Secondary | ICD-10-CM | POA: Diagnosis not present

## 2017-12-08 DIAGNOSIS — I5022 Chronic systolic (congestive) heart failure: Secondary | ICD-10-CM | POA: Diagnosis not present

## 2017-12-08 DIAGNOSIS — I10 Essential (primary) hypertension: Secondary | ICD-10-CM | POA: Diagnosis not present

## 2017-12-08 DIAGNOSIS — I255 Ischemic cardiomyopathy: Secondary | ICD-10-CM | POA: Diagnosis not present

## 2017-12-14 DIAGNOSIS — I739 Peripheral vascular disease, unspecified: Secondary | ICD-10-CM | POA: Diagnosis not present

## 2017-12-14 DIAGNOSIS — Z01810 Encounter for preprocedural cardiovascular examination: Secondary | ICD-10-CM | POA: Diagnosis not present

## 2017-12-14 DIAGNOSIS — I447 Left bundle-branch block, unspecified: Secondary | ICD-10-CM | POA: Diagnosis not present

## 2017-12-14 DIAGNOSIS — I10 Essential (primary) hypertension: Secondary | ICD-10-CM | POA: Diagnosis not present

## 2017-12-14 DIAGNOSIS — I5022 Chronic systolic (congestive) heart failure: Secondary | ICD-10-CM | POA: Diagnosis not present

## 2017-12-15 DIAGNOSIS — I739 Peripheral vascular disease, unspecified: Secondary | ICD-10-CM | POA: Diagnosis not present

## 2017-12-15 DIAGNOSIS — I493 Ventricular premature depolarization: Secondary | ICD-10-CM | POA: Diagnosis not present

## 2017-12-15 DIAGNOSIS — I255 Ischemic cardiomyopathy: Secondary | ICD-10-CM | POA: Diagnosis not present

## 2017-12-15 DIAGNOSIS — Z6831 Body mass index (BMI) 31.0-31.9, adult: Secondary | ICD-10-CM | POA: Diagnosis not present

## 2017-12-15 DIAGNOSIS — N400 Enlarged prostate without lower urinary tract symptoms: Secondary | ICD-10-CM | POA: Diagnosis not present

## 2017-12-15 DIAGNOSIS — I447 Left bundle-branch block, unspecified: Secondary | ICD-10-CM | POA: Diagnosis not present

## 2017-12-15 DIAGNOSIS — I2510 Atherosclerotic heart disease of native coronary artery without angina pectoris: Secondary | ICD-10-CM | POA: Diagnosis not present

## 2017-12-15 DIAGNOSIS — Z85828 Personal history of other malignant neoplasm of skin: Secondary | ICD-10-CM | POA: Diagnosis not present

## 2017-12-15 DIAGNOSIS — I5022 Chronic systolic (congestive) heart failure: Secondary | ICD-10-CM | POA: Diagnosis not present

## 2017-12-15 DIAGNOSIS — E669 Obesity, unspecified: Secondary | ICD-10-CM | POA: Diagnosis not present

## 2017-12-15 DIAGNOSIS — F17210 Nicotine dependence, cigarettes, uncomplicated: Secondary | ICD-10-CM | POA: Diagnosis not present

## 2017-12-15 DIAGNOSIS — I130 Hypertensive heart and chronic kidney disease with heart failure and stage 1 through stage 4 chronic kidney disease, or unspecified chronic kidney disease: Secondary | ICD-10-CM | POA: Diagnosis not present

## 2017-12-15 DIAGNOSIS — N189 Chronic kidney disease, unspecified: Secondary | ICD-10-CM | POA: Diagnosis not present

## 2017-12-15 DIAGNOSIS — I252 Old myocardial infarction: Secondary | ICD-10-CM | POA: Diagnosis not present

## 2017-12-15 DIAGNOSIS — Z8711 Personal history of peptic ulcer disease: Secondary | ICD-10-CM | POA: Diagnosis not present

## 2017-12-15 DIAGNOSIS — Z955 Presence of coronary angioplasty implant and graft: Secondary | ICD-10-CM | POA: Diagnosis not present

## 2017-12-16 DIAGNOSIS — I255 Ischemic cardiomyopathy: Secondary | ICD-10-CM | POA: Diagnosis not present

## 2017-12-16 DIAGNOSIS — Z95810 Presence of automatic (implantable) cardiac defibrillator: Secondary | ICD-10-CM | POA: Insufficient documentation

## 2017-12-18 ENCOUNTER — Other Ambulatory Visit: Payer: Self-pay

## 2017-12-18 ENCOUNTER — Encounter: Payer: Self-pay | Admitting: Emergency Medicine

## 2017-12-18 ENCOUNTER — Emergency Department
Admission: EM | Admit: 2017-12-18 | Discharge: 2017-12-18 | Disposition: A | Discharge status: Home / Self Care | Payer: PPO | Attending: Emergency Medicine | Admitting: Emergency Medicine

## 2017-12-18 DIAGNOSIS — I252 Old myocardial infarction: Secondary | ICD-10-CM | POA: Diagnosis not present

## 2017-12-18 DIAGNOSIS — Z79899 Other long term (current) drug therapy: Secondary | ICD-10-CM | POA: Insufficient documentation

## 2017-12-18 DIAGNOSIS — T819XXA Unspecified complication of procedure, initial encounter: Secondary | ICD-10-CM | POA: Diagnosis not present

## 2017-12-18 DIAGNOSIS — I2510 Atherosclerotic heart disease of native coronary artery without angina pectoris: Secondary | ICD-10-CM | POA: Diagnosis not present

## 2017-12-18 DIAGNOSIS — Z7902 Long term (current) use of antithrombotics/antiplatelets: Secondary | ICD-10-CM | POA: Insufficient documentation

## 2017-12-18 DIAGNOSIS — F17210 Nicotine dependence, cigarettes, uncomplicated: Secondary | ICD-10-CM | POA: Insufficient documentation

## 2017-12-18 DIAGNOSIS — Z7982 Long term (current) use of aspirin: Secondary | ICD-10-CM | POA: Insufficient documentation

## 2017-12-18 DIAGNOSIS — I110 Hypertensive heart disease with heart failure: Secondary | ICD-10-CM | POA: Insufficient documentation

## 2017-12-18 DIAGNOSIS — Y829 Unspecified medical devices associated with adverse incidents: Secondary | ICD-10-CM | POA: Diagnosis not present

## 2017-12-18 DIAGNOSIS — T82838A Hemorrhage of vascular prosthetic devices, implants and grafts, initial encounter: Secondary | ICD-10-CM | POA: Insufficient documentation

## 2017-12-18 DIAGNOSIS — I509 Heart failure, unspecified: Secondary | ICD-10-CM | POA: Insufficient documentation

## 2017-12-18 NOTE — Discharge Instructions (Signed)
Keep the dressing in place. Call your surgeon on Monday to determine when the dressing needs to be removed.  Return to the emergency room if you notice continued bleeding, dizziness, chest pain or shortness of breath, fever.

## 2017-12-18 NOTE — ED Provider Notes (Signed)
[facility] Emergency Department Provider Note  ____________________________________________  Time seen: Approximately 7:24 AM  I have reviewed the triage vital signs and the nursing notes.   HISTORY  Chief Complaint bleeding around pacemaker   HPI Peter [name] is a 77 y.o. male POD 3 from pacemaker placement at [facility] who presents for evaluation of bleeding over the surgical site. Patient reports oozing since the procedure but noted increased bleeding overnight. The bleeding is coming from the surgical site. Patient is on Plavix. No dizziness, CP, SOB, fever.  Past Medical History:  Diagnosis Date  . Anginal pain ([place])   . Asthma   . Bladder infection, acute 03/2011   "had a whole lot of bleeding from this"  . CHF (congestive heart failure) ([place])   . Coronary artery disease   . High cholesterol   . Hypertension   . 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 Myocardial infarction ([place]) 1996  . NSTEMI (non-ST elevated myocardial infarction) ([place]) 12/14/2011  . Shortness of breath 12/14/2011   "@ rest, lying down, w/exertion"    Patient Active Problem List   Diagnosis Date Noted  . Congestive heart failure ([place]) 01/17/2015  . Current tobacco use 01/17/2015  . Hypercholesterolemia 01/17/2015  . Peripheral vascular disease ([place]) 01/17/2015  . 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 Arthritis, degenerative 07/22/2013  . H/O angina pectoris 07/22/2013  . Adiposity 07/22/2013  . 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 Hypokalemia 12/15/2011  . SOB (shortness of breath) 12/14/2011  . CAD (coronary artery disease) 12/14/2011  . HTN (hypertension) 12/14/2011  . Hyperlipidemia 12/14/2011  . GERD (gastroesophageal reflux disease) 12/14/2011  . BPH (benign prostatic hyperplasia) 12/14/2011  . 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 [place]; ~ 2003; ~ 2008   "total of 3"  . CORONARY STENT INTERVENTION N/A 08/10/2016   Procedure: Coronary Stent Intervention;  Surgeon: [name], MD;  Location: [place] CV LAB;  Service: Cardiovascular;  Laterality: N/A;  . HERNIA REPAIR  ~ 2001   "abdominal w/mesh implanted"  . LEFT HEART CATH AND CORONARY ANGIOGRAPHY N/A 08/10/2016   Procedure: Left Heart Cath and Coronary Angiography;  Surgeon: [name], MD;  Location: [place] CV LAB;  Service: Cardiovascular;  Laterality: N/A;  . LEFT HEART CATHETERIZATION WITH CORONARY ANGIOGRAM N/A 12/16/2011   Procedure: LEFT HEART CATHETERIZATION WITH CORONARY ANGIOGRAM;  Surgeon: Minus Breeding, MD;  Location: [facility] CATH LAB;  Service: Cardiovascular;  Laterality: N/A;  . PERCUTANEOUS CORONARY STENT INTERVENTION (PCI-S) N/A 12/17/2011   Procedure: PERCUTANEOUS CORONARY STENT INTERVENTION (PCI-S);  Surgeon: Sherren Mocha, MD;  Location: [facility] CATH LAB;  Service: Cardiovascular;  Laterality: N/A;  . TONSILLECTOMY     "I was a kid"    Prior to Admission medications   Medication Sig Start Date End Date Taking? Authorizing Provider  amLODipine-benazepril (LOTREL) 10-40 MG capsule TAKE 1 CAPSULE BY MOUTH EVERY DAY 10/21/15   [provider]  aspirin 325 MG tablet Take 325 mg by mouth 2 (two) times daily. Patient takes if there is any pain or burning in the chest.    [provider]  atorvastatin (LIPITOR) 40 MG tablet Take 1 tablet (40 mg total) by mouth daily at 6 PM. 12/18/11   Annita Brod, MD  carvedilol (COREG) 3.125 MG tablet Take 3.125 mg by mouth 2 (two) times daily with a meal.    [provider]  cetirizine (WAL-ZYR) 10 MG tablet Take 10 mg by mouth.    [provider]  clobetasol cream (TEMOVATE) 0.05 % APPLY TO PSORIASIS BID PRN UNTIL SMOOTH 11/05/14   [provider]  clopidogrel (PLAVIX) 75 MG tablet TAKE 1 TABLET(75 MG) BY MOUTH EVERY DAY 08/11/16   Fritzi Mandes, MD  cyanocobalamin 1000 MCG tablet Take 1,000 mcg by mouth daily.     [provider]  finasteride (PROSCAR) 5 MG tablet Take 5 mg by mouth daily.    [provider]  furosemide (LASIX) 20 MG tablet Take 1 tablet (20 mg total) by mouth daily. Patient taking differently: Take 20 mg by mouth 2 (two) times daily.  09/10/16 09/10/17  Epifanio Lesches, MD  GLUCOSAMINE-CHONDROITIN-VIT C PO Take 1 tablet by mouth daily.    [provider]  hydrochlorothiazide (HYDRODIURIL) 12.5 MG tablet Take 12.5 mg by mouth daily.  04/22/15   [provider]  isosorbide mononitrate (IMDUR) 30 MG 24 hr tablet Take 30 mg by mouth daily. 06/18/16   [provider]  omeprazole (PRILOSEC) 20 MG capsule Take by mouth. 01/16/16   [provider]  tamsulosin (FLOMAX) 0.4 MG CAPS capsule TAKE 1 CAPSULE BY MOUTH EVERY DAY 10/28/15   [provider]    Allergies Iodinated diagnostic agents and Iodinated glycerol  [glycerol, iodinated]  Family History  Family history unknown: Yes    Social History Social History   Tobacco Use  . Smoking status: Current Every Day Smoker    Packs/day: 0.50    Years: 0.50    Pack years: 0.25    Types: Cigarettes  . Smokeless tobacco: Never Used  . Tobacco comment: 11/23/16 Still not ready to quit smoking.  Substance Use Topics  . Alcohol use: Yes    Alcohol/week: 1.0 standard drinks    Types: 1 Cans of beer per week    Comment: 12/14/2011 "if my kidneys are sore, I drink 1 beer/day; if not sore; no beer; occasionally drink socially; ave 1 beer/wk maybe"  . Drug use: No    Review of Systems  Constitutional: Negative for fever. Eyes: Negative for visual changes. ENT: Negative for sore throat. Neck: No neck pain  Cardiovascular: Negative for chest pain. Respiratory: Negative for shortness of breath. Gastrointestinal:  Negative for abdominal pain, vomiting or diarrhea. Genitourinary: Negative for dysuria. Musculoskeletal: Negative for back pain. Skin: Negative for rash. + bleeding from surgical site Neurological: Negative for headaches, weakness or numbness. Psych: No SI or HI  ____________________________________________   PHYSICAL EXAM:  VITAL SIGNS: ED Triage Vitals  Enc Vitals Group     BP 12/18/17 0704 120/87     Pulse Rate 12/18/17 0704 (!) 101     Resp 12/18/17 0704 18     Temp 12/18/17 0704 98.4 F (36.9 C)     Temp Source 12/18/17 0704 Oral     SpO2 12/18/17 0704 96 %     Weight 12/18/17 0703 213 lb 6.5 oz (96.8 kg)     Height --      Head Circumference --      Peak Flow --      Pain Score --      Pain Loc --      Pain Edu? --      Excl. in [place]? --     Constitutional: Alert and oriented. Well  appearing and in no apparent distress. HEENT:      Head: Normocephalic and atraumatic.         Eyes: Conjunctivae are normal. Sclera is non-icteric.       Mouth/Throat: Mucous membranes are moist.       Neck: Supple with no signs of meningismus. Cardiovascular: Regular rate and rhythm. No murmurs, gallops, or rubs. 2+ symmetrical distal pulses are present in all extremities. No JVD. Chest wall: Diffuse bruising noted across the chest wall. Pacer site has a small clot coming from underneath surgical steristrips, no active bleeding. Respiratory: Normal respiratory effort. Lungs are clear to auscultation bilaterally. No wheezes, crackles, or rhonchi.  Musculoskeletal: Nontender with normal range of motion in all extremities. No edema, cyanosis, or erythema of extremities. Neurologic: Normal speech and language. Face is symmetric. Moving all extremities. No gross focal neurologic deficits are appreciated. Skin: Skin is warm, dry and intact. No rash noted. Psychiatric: Mood and affect are normal. Speech and behavior are normal.  ____________________________________________   LABS (all labs  ordered are listed, but only abnormal results are displayed)  Labs Reviewed - No data to display ____________________________________________  EKG  none  ____________________________________________  RADIOLOGY  none  ____________________________________________   PROCEDURES  Procedure(s) performed: None Procedures Critical Care performed:  None ____________________________________________   INITIAL IMPRESSION / ASSESSMENT AND PLAN / ED COURSE  77 y.o. male POD 3 from pacemaker placement at [facility] who presents for evaluation of bleeding over the surgical site.  On exam patient has chest wall bruising which is consistent with recent surgery.  The most medial Steri-Strip seems to be coming off and there was a small amount of oozing from that area.  That was controlled with minimal pressure after blood clot was removed.  I reapply a new layer of Steri-Strips over the ones placed by the surgeon.  Patient was monitored for 30 minutes with no recurrence of the bleeding.  The wound was redressed.  Recommended follow-up with his cardiologist on Monday if the issue is ongoing.  Discussed signs and symptoms of acute blood loss anemia and recommended return to the emergency room if these develop.    _________________________ 8:17 AM on 12/18/2017 ----------------------------------------- No signs of active bleeding.  Wound was redressed.  Recommended follow-up with his cardiologist on Monday or return to the emergency room if bleeding restarts    As part of my medical decision making, I reviewed the following data within the [place] notes reviewed and incorporated, Old chart reviewed, Notes from prior ED visits and [place] Controlled Substance Database    Pertinent labs & imaging results that were available during my care of the patient were reviewed by me and considered in my medical decision making (see chart for  details).    ____________________________________________   FINAL CLINICAL IMPRESSION(S) / ED DIAGNOSES  Final diagnoses:  Postoperative or surgical complication, initial encounter      NEW MEDICATIONS STARTED DURING THIS VISIT:  ED Discharge Orders    None       Note:  This document was prepared using Dragon voice recognition software and may include unintentional dictation errors.    Alfred Levins, [place], MD 12/18/17 [phone]

## 2017-12-18 NOTE — ED Notes (Signed)
First Nurse Note: Patient states he had a pacemaker placed at [facility] on 12/15/17 and is here this AM with bleeding from insertion site.

## 2017-12-18 NOTE — ED Notes (Signed)
Pt had pacemaker placed Wednesday - today he is experiencing bleeding at the site of the new pacemaker - gauze/steri strips are saturated with blood - at this time no active bleeding noted - pt has significant bruising across entire chest wall and upper/mid abd - Dr Alfred Levins at bedside - provider removed moderate size blood clot from around area - provider placed additional steri strips to the area and we will observe pt for the next 30 minutes for bleeding

## 2017-12-18 NOTE — ED Triage Notes (Signed)
Pt here for bleeding around pacemaker site. Was placed Wednesday and had some slight oozing but reports increased bleeding today.

## 2017-12-21 ENCOUNTER — Emergency Department
Admission: EM | Admit: 2017-12-21 | Discharge: 2017-12-21 | Disposition: A | Discharge status: Home / Self Care | Payer: PPO | Attending: Emergency Medicine | Admitting: Emergency Medicine

## 2017-12-21 ENCOUNTER — Encounter: Payer: Self-pay | Admitting: Emergency Medicine

## 2017-12-21 DIAGNOSIS — R531 Weakness: Secondary | ICD-10-CM

## 2017-12-21 DIAGNOSIS — I2510 Atherosclerotic heart disease of native coronary artery without angina pectoris: Secondary | ICD-10-CM | POA: Diagnosis not present

## 2017-12-21 DIAGNOSIS — I252 Old myocardial infarction: Secondary | ICD-10-CM | POA: Insufficient documentation

## 2017-12-21 DIAGNOSIS — F17210 Nicotine dependence, cigarettes, uncomplicated: Secondary | ICD-10-CM | POA: Insufficient documentation

## 2017-12-21 DIAGNOSIS — Z9889 Other specified postprocedural states: Secondary | ICD-10-CM | POA: Diagnosis not present

## 2017-12-21 DIAGNOSIS — I5022 Chronic systolic (congestive) heart failure: Secondary | ICD-10-CM | POA: Diagnosis not present

## 2017-12-21 DIAGNOSIS — I509 Heart failure, unspecified: Secondary | ICD-10-CM | POA: Diagnosis not present

## 2017-12-21 DIAGNOSIS — E7800 Pure hypercholesterolemia, unspecified: Secondary | ICD-10-CM | POA: Insufficient documentation

## 2017-12-21 DIAGNOSIS — Z7902 Long term (current) use of antithrombotics/antiplatelets: Secondary | ICD-10-CM | POA: Diagnosis not present

## 2017-12-21 DIAGNOSIS — I110 Hypertensive heart disease with heart failure: Secondary | ICD-10-CM | POA: Insufficient documentation

## 2017-12-21 DIAGNOSIS — Z7982 Long term (current) use of aspirin: Secondary | ICD-10-CM | POA: Insufficient documentation

## 2017-12-21 DIAGNOSIS — I10 Essential (primary) hypertension: Secondary | ICD-10-CM | POA: Diagnosis not present

## 2017-12-21 DIAGNOSIS — I447 Left bundle-branch block, unspecified: Secondary | ICD-10-CM | POA: Diagnosis not present

## 2017-12-21 DIAGNOSIS — E875 Hyperkalemia: Secondary | ICD-10-CM | POA: Diagnosis not present

## 2017-12-21 DIAGNOSIS — I739 Peripheral vascular disease, unspecified: Secondary | ICD-10-CM | POA: Diagnosis not present

## 2017-12-21 DIAGNOSIS — Z79899 Other long term (current) drug therapy: Secondary | ICD-10-CM | POA: Insufficient documentation

## 2017-12-21 DIAGNOSIS — R002 Palpitations: Secondary | ICD-10-CM | POA: Diagnosis not present

## 2017-12-21 DIAGNOSIS — Z95810 Presence of automatic (implantable) cardiac defibrillator: Secondary | ICD-10-CM | POA: Diagnosis not present

## 2017-12-21 DIAGNOSIS — F17200 Nicotine dependence, unspecified, uncomplicated: Secondary | ICD-10-CM | POA: Diagnosis not present

## 2017-12-21 DIAGNOSIS — I255 Ischemic cardiomyopathy: Secondary | ICD-10-CM | POA: Diagnosis not present

## 2017-12-21 DIAGNOSIS — Z950 Presence of cardiac pacemaker: Secondary | ICD-10-CM | POA: Diagnosis not present

## 2017-12-21 DIAGNOSIS — E669 Obesity, unspecified: Secondary | ICD-10-CM | POA: Diagnosis not present

## 2017-12-21 DIAGNOSIS — J449 Chronic obstructive pulmonary disease, unspecified: Secondary | ICD-10-CM | POA: Diagnosis not present

## 2017-12-21 LAB — URINALYSIS, COMPLETE (UACMP) WITH MICROSCOPIC
BILIRUBIN URINE: NEGATIVE
Bacteria, UA: NONE SEEN
Glucose, UA: NEGATIVE mg/dL
Hgb urine dipstick: NEGATIVE
KETONES UR: NEGATIVE mg/dL
LEUKOCYTES UA: NEGATIVE
NITRITE: NEGATIVE
Protein, ur: NEGATIVE mg/dL
SPECIFIC GRAVITY, URINE: 1.008 (ref 1.005–1.030)
SQUAMOUS EPITHELIAL / LPF: NONE SEEN (ref 0–5)
pH: 7 (ref 5.0–8.0)

## 2017-12-21 LAB — BASIC METABOLIC PANEL
Anion gap: 7 (ref 5–15)
BUN: 29 mg/dL — ABNORMAL HIGH (ref 8–23)
CHLORIDE: 100 mmol/L (ref 98–111)
CO2: 27 mmol/L (ref 22–32)
Calcium: 8.3 mg/dL — ABNORMAL LOW (ref 8.9–10.3)
Creatinine, Ser: 1.4 mg/dL — ABNORMAL HIGH (ref 0.61–1.24)
GFR calc non Af Amer: 47 mL/min — ABNORMAL LOW (ref >60)
GFR, EST AFRICAN AMERICAN: 54 mL/min — AB (ref >60)
Glucose, Bld: 109 mg/dL — ABNORMAL HIGH (ref 70–99)
POTASSIUM: 5.2 mmol/L — AB (ref 3.5–5.1)
SODIUM: 134 mmol/L — AB (ref 135–145)

## 2017-12-21 LAB — CBC
HCT: 33.1 % — ABNORMAL LOW (ref 40.0–52.0)
HEMOGLOBIN: 11.4 g/dL — AB (ref 13.0–18.0)
MCH: 30.4 pg (ref 26.0–34.0)
MCHC: 34.4 g/dL (ref 32.0–36.0)
MCV: 88.3 fL (ref 80.0–100.0)
Platelets: 169 10*3/uL (ref 150–440)
RBC: 3.75 MIL/uL — ABNORMAL LOW (ref 4.40–5.90)
RDW: 13.5 % (ref 11.5–14.5)
WBC: 7.5 10*3/uL (ref 3.8–10.6)

## 2017-12-21 LAB — TROPONIN I: TROPONIN I: 0.05 ng/mL — AB (ref <0.03)

## 2017-12-21 MED ORDER — PATIROMER SORBITEX CALCIUM 8.4 G PO PACK
8.4000 g | PACK | Freq: Once | ORAL | Status: AC
  Administered 2017-12-21: 8.4 g via ORAL
  Filled 2017-12-21: qty 1

## 2017-12-21 NOTE — ED Triage Notes (Signed)
Pt to ED by referral by PCP for bloodwork. Pt had pacemaker placed last Wednesday. Pt with c/o of increased weakness.

## 2017-12-21 NOTE — ED Notes (Signed)
FIRST NURSE NOTE:  Pt sent here by Dr. Clayborn Bigness, pt is POD 5 from pacemaker placement.  No distress noted at registration.

## 2017-12-21 NOTE — ED Provider Notes (Signed)
[facility] Emergency Department Provider Note ____________________________________________   First MD Initiated Contact with Patient 12/21/17 1212     (approximate)  I have reviewed the triage vital signs and the nursing notes.   HISTORY  Chief Complaint No chief complaint on file.  HPI Peter [name] is a 77 y.o. male with a recent history of pacemaker implantation was presented to the emergency department with several hours of weakness that started this morning.  He denies any shortness of breath or chest pain.  Denies any focal weakness.  Denies any burning with urination.  Was here on the seventh for bleeding over his recently placed pacemaker site.  However, he says that the bandages have not any further saturation of the blood since being here this past Saturday.  He was sent in by his doctor, Dr. call would, for further evaluation in the emergency department.  The patient says that he is back to his baseline at this time.  Denies feeling weak at this time anymore.  Denies any change in his sleeping pattern.  Eating and drinking normally.  No nausea or vomiting.  Past Medical History:  Diagnosis Date  . Anginal pain ([place])   . Asthma   . Bladder infection, acute 03/2011   "had a whole lot of bleeding from this"  . CHF (congestive heart failure) ([place])   . Coronary artery disease   . High cholesterol   . Hypertension   . 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 Myocardial infarction ([place]) 1996  . NSTEMI (non-ST elevated myocardial infarction) ([place]) 12/14/2011  . Shortness of breath 12/14/2011   "@ rest, lying down, w/exertion"    Patient Active Problem List   Diagnosis Date Noted  . Congestive heart failure ([place]) 01/17/2015  . Current tobacco use 01/17/2015  . Hypercholesterolemia 01/17/2015  . Peripheral vascular disease ([place]) 01/17/2015  . 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  Arthritis, degenerative 07/22/2013  . H/O angina pectoris 07/22/2013  . Adiposity 07/22/2013  . 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 Hypokalemia 12/15/2011  . SOB (shortness of breath) 12/14/2011  . CAD (coronary artery disease) 12/14/2011  . HTN (hypertension) 12/14/2011  . Hyperlipidemia 12/14/2011  . GERD (gastroesophageal reflux disease) 12/14/2011  . BPH (benign prostatic hyperplasia) 12/14/2011  . 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 [place]; ~ 2003; ~ 2008   "total of 3"  . CORONARY STENT INTERVENTION N/A 08/10/2016   Procedure: Coronary Stent Intervention;  Surgeon: [name], MD;  Location: [place] CV LAB;  Service: Cardiovascular;  Laterality: N/A;  . HERNIA REPAIR  ~ 2001   "abdominal w/mesh implanted"  . LEFT HEART CATH AND CORONARY ANGIOGRAPHY N/A 08/10/2016   Procedure: Left Heart Cath and Coronary Angiography;  Surgeon: [name], MD;  Location: [place] CV LAB;  Service: Cardiovascular;  Laterality: N/A;  . LEFT HEART CATHETERIZATION WITH CORONARY ANGIOGRAM N/A 12/16/2011   Procedure: LEFT HEART CATHETERIZATION WITH CORONARY ANGIOGRAM;  Surgeon: Minus Breeding, MD;  Location: [facility] CATH LAB;  Service: Cardiovascular;  Laterality: N/A;  . PERCUTANEOUS CORONARY STENT INTERVENTION (PCI-S) N/A 12/17/2011   Procedure: PERCUTANEOUS CORONARY STENT INTERVENTION (PCI-S);  Surgeon: Sherren Mocha, MD;  Location: [facility] CATH LAB;  Service: Cardiovascular;  Laterality: N/A;  . TONSILLECTOMY     "  I was a kid"    Prior to Admission medications   Medication Sig Start Date End Date Taking? Authorizing Provider  amLODipine-benazepril (LOTREL) 10-40 MG capsule TAKE 1 CAPSULE BY MOUTH EVERY DAY 10/21/15   [provider]  aspirin 325 MG tablet Take 325 mg by mouth 2 (two) times  daily. Patient takes if there is any pain or burning in the chest.    [provider]  atorvastatin (LIPITOR) 40 MG tablet Take 1 tablet (40 mg total) by mouth daily at 6 PM. 12/18/11   Annita Brod, MD  carvedilol (COREG) 3.125 MG tablet Take 3.125 mg by mouth 2 (two) times daily with a meal.    [provider]  cetirizine (WAL-ZYR) 10 MG tablet Take 10 mg by mouth.    [provider]  clobetasol cream (TEMOVATE) 0.05 % APPLY TO PSORIASIS BID PRN UNTIL SMOOTH 11/05/14   [provider]  clopidogrel (PLAVIX) 75 MG tablet TAKE 1 TABLET(75 MG) BY MOUTH EVERY DAY 08/11/16   Fritzi Mandes, MD  cyanocobalamin 1000 MCG tablet Take 1,000 mcg by mouth daily.     [provider]  finasteride (PROSCAR) 5 MG tablet Take 5 mg by mouth daily.    [provider]  furosemide (LASIX) 20 MG tablet Take 1 tablet (20 mg total) by mouth daily. Patient taking differently: Take 20 mg by mouth 2 (two) times daily.  09/10/16 09/10/17  Epifanio Lesches, MD  GLUCOSAMINE-CHONDROITIN-VIT C PO Take 1 tablet by mouth daily.    [provider]  hydrochlorothiazide (HYDRODIURIL) 12.5 MG tablet Take 12.5 mg by mouth daily.  04/22/15   [provider]  isosorbide mononitrate (IMDUR) 30 MG 24 hr tablet Take 30 mg by mouth daily. 06/18/16   [provider]  omeprazole (PRILOSEC) 20 MG capsule Take by mouth. 01/16/16   [provider]  tamsulosin (FLOMAX) 0.4 MG CAPS capsule TAKE 1 CAPSULE BY MOUTH EVERY DAY 10/28/15   [provider]    Allergies Iodinated diagnostic agents and Iodinated glycerol  [glycerol, iodinated]  Family History  Family history unknown: Yes    Social History Social History   Tobacco Use  . Smoking status: Current Every Day Smoker    Packs/day: 0.50    Years: 0.50    Pack years: 0.25    Types: Cigarettes  . Smokeless tobacco: Never Used  . Tobacco comment: 11/23/16 Still not ready to quit smoking.    Substance Use Topics  . Alcohol use: Yes    Alcohol/week: 1.0 standard drinks    Types: 1 Cans of beer per week    Comment: 12/14/2011 "if my kidneys are sore, I drink 1 beer/day; if not sore; no beer; occasionally drink socially; ave 1 beer/wk maybe"  . Drug use: No    Review of Systems  Constitutional: No fever/chills Eyes: No visual changes. ENT: No sore throat. Cardiovascular: Denies chest pain. Respiratory: Denies shortness of breath. Gastrointestinal: No abdominal pain.  No nausea, no vomiting.  No diarrhea.  No constipation. Genitourinary: Negative for dysuria. Musculoskeletal: Negative for back pain. Skin: Negative for rash. Neurological: Negative for headaches, focal weakness or numbness.   ____________________________________________   PHYSICAL EXAM:  VITAL SIGNS: ED Triage Vitals  Enc Vitals Group     BP 12/21/17 1151 135/61     Pulse Rate 12/21/17 1151 62     Resp 12/21/17 1151 16     Temp 12/21/17 1151 98.4 F (36.9 C)     Temp src --  SpO2 12/21/17 1151 98 %     Weight 12/21/17 1154 213 lb 6.5 oz (96.8 kg)     Height 12/21/17 1154 5\' 10"  (1.778 m)     Head Circumference --      Peak Flow --      Pain Score 12/21/17 1154 0     Pain Loc --      Pain Edu? --      Excl. in [place]? --     Constitutional: Alert and oriented. Well appearing and in no acute distress. Eyes: Conjunctivae are normal.  Head: Atraumatic. Nose: No congestion/rhinnorhea. Mouth/Throat: Mucous membranes are moist.  Neck: No stridor.   Cardiovascular: Normal rate, regular rhythm. Grossly normal heart sounds.  Pacer pocket with bandages with a small amount of maroon, dried blood on them.  I remove the bandages and the patient has a Steri-Strips works are still over the pocket.  No active bleeding at this time.  No tenderness to palpation.  There is ecchymosis across the chest which appears to be already starting to breakdown. Respiratory: Normal respiratory effort.  No retractions.  Lungs CTAB. Gastrointestinal: Soft and nontender. No distention. No CVA tenderness. Musculoskeletal: No lower extremity tenderness nor edema.  No joint effusions. Neurologic:  Normal speech and language. No gross focal neurologic deficits are appreciated. Skin:  Skin is warm, dry and intact. No rash noted. Psychiatric: Mood and affect are normal. Speech and behavior are normal.  ____________________________________________   LABS (all labs ordered are listed, but only abnormal results are displayed)  Labs Reviewed  BASIC METABOLIC PANEL - Abnormal; Notable for the following components:      Result Value   Sodium 134 (*)    Potassium 5.2 (*)    Glucose, Bld 109 (*)    BUN 29 (*)    Creatinine, Ser 1.40 (*)    Calcium 8.3 (*)    GFR calc non Af Amer 47 (*)    GFR calc Af Amer 54 (*)    All other components within normal limits  CBC - Abnormal; Notable for the following components:   RBC 3.75 (*)    Hemoglobin 11.4 (*)    HCT 33.1 (*)    All other components within normal limits  URINALYSIS, COMPLETE (UACMP) WITH MICROSCOPIC - Abnormal; Notable for the following components:   Color, Urine YELLOW (*)    APPearance HAZY (*)    All other components within normal limits  TROPONIN I - Abnormal; Notable for the following components:   Troponin I 0.05 (*)    All other components within normal limits  CBG MONITORING, ED   ____________________________________________  EKG  ED ECG REPORT I, Doran Stabler, the attending physician, personally viewed and interpreted this ECG.   Date: 12/21/2017  EKG Time: 1155  Rate: 55  Rhythm: Dual-chamber pacing  Axis: Normal  Intervals:nonspecific intraventricular conduction delay  ST&T Change: T wave inversions in 1, aVL.  No ST segment elevation or depression.  ____________________________________________  RADIOLOGY   ____________________________________________   PROCEDURES  Procedure(s) performed:   Procedures  Critical  Care performed:   ____________________________________________   INITIAL IMPRESSION / ASSESSMENT AND PLAN / ED COURSE  Pertinent labs & imaging results that were available during my care of the patient were reviewed by me and considered in my medical decision making (see chart for details).  DDX: ACS, UTI, electrolyte abnormality, anemia As part of my medical decision making, I reviewed the following data within the [place] Notes from  prior ED visits  ----------------------------------------- 1:42 PM on 12/21/2017 -----------------------------------------  Patient found to have slightly elevated potassium.  We will give him a dose of [place].  He will follow-up with Dr. Danny Lawless later in the week for repeat blood draw.  He says that he will be following up with Dr. call would later this afternoon.  He is understanding of the diagnosis as well as treatment and willing to comply. ____________________________________________   FINAL CLINICAL IMPRESSION(S) / ED DIAGNOSES  Hyperkalemia.  Weakness.  NEW MEDICATIONS STARTED DURING THIS VISIT:  New Prescriptions   No medications on file     Note:  This document was prepared using Dragon voice recognition software and may include unintentional dictation errors.     Orbie Pyo, MD 12/21/17 1343

## 2017-12-30 DIAGNOSIS — E785 Hyperlipidemia, unspecified: Secondary | ICD-10-CM | POA: Diagnosis not present

## 2017-12-30 DIAGNOSIS — Z95810 Presence of automatic (implantable) cardiac defibrillator: Secondary | ICD-10-CM | POA: Diagnosis not present

## 2017-12-30 DIAGNOSIS — I5022 Chronic systolic (congestive) heart failure: Secondary | ICD-10-CM | POA: Diagnosis not present

## 2017-12-30 DIAGNOSIS — I255 Ischemic cardiomyopathy: Secondary | ICD-10-CM | POA: Diagnosis not present

## 2017-12-30 DIAGNOSIS — E669 Obesity, unspecified: Secondary | ICD-10-CM | POA: Diagnosis not present

## 2017-12-30 DIAGNOSIS — F17200 Nicotine dependence, unspecified, uncomplicated: Secondary | ICD-10-CM | POA: Diagnosis not present

## 2017-12-30 DIAGNOSIS — G4733 Obstructive sleep apnea (adult) (pediatric): Secondary | ICD-10-CM | POA: Diagnosis not present

## 2017-12-30 DIAGNOSIS — R0609 Other forms of dyspnea: Secondary | ICD-10-CM | POA: Diagnosis not present

## 2018-01-18 DIAGNOSIS — H353232 Exudative age-related macular degeneration, bilateral, with inactive choroidal neovascularization: Secondary | ICD-10-CM | POA: Diagnosis not present

## 2018-02-03 ENCOUNTER — Ambulatory Visit: Payer: PPO

## 2018-02-14 ENCOUNTER — Encounter: Payer: Self-pay | Admitting: Urology

## 2018-02-14 ENCOUNTER — Ambulatory Visit: Payer: PPO | Admitting: Urology

## 2018-02-14 ENCOUNTER — Other Ambulatory Visit: Payer: Self-pay

## 2018-02-14 VITALS — BP 153/71 | HR 79 | Ht 70.0 in | Wt 219.1 lb

## 2018-02-14 DIAGNOSIS — N401 Enlarged prostate with lower urinary tract symptoms: Secondary | ICD-10-CM | POA: Diagnosis not present

## 2018-02-14 DIAGNOSIS — N400 Enlarged prostate without lower urinary tract symptoms: Secondary | ICD-10-CM | POA: Diagnosis not present

## 2018-02-14 LAB — URINALYSIS, COMPLETE
Bilirubin, UA: NEGATIVE
GLUCOSE, UA: NEGATIVE
Ketones, UA: NEGATIVE
Leukocytes, UA: NEGATIVE
NITRITE UA: NEGATIVE
PH UA: 6 (ref 5.0–7.5)
Protein, UA: NEGATIVE
Specific Gravity, UA: 1.01 (ref 1.005–1.030)
UUROB: 0.2 mg/dL (ref 0.2–1.0)

## 2018-02-14 LAB — BLADDER SCAN AMB NON-IMAGING: SCAN RESULT: 67

## 2018-02-14 LAB — MICROSCOPIC EXAMINATION
BACTERIA UA: NONE SEEN
EPITHELIAL CELLS (NON RENAL): NONE SEEN /HPF (ref 0–10)
WBC UA: NONE SEEN /HPF (ref 0–5)

## 2018-02-14 MED ORDER — FINASTERIDE 5 MG PO TABS: 5.0000 mg | ORAL_TABLET | Freq: Every day | ORAL | 11 refills | Status: DC

## 2018-02-14 MED ORDER — TAMSULOSIN HCL 0.4 MG PO CAPS: 0.4000 mg | ORAL_CAPSULE | Freq: Every day | ORAL | 11 refills | Status: DC

## 2018-02-14 NOTE — Progress Notes (Signed)
   02/14/2018 10:15 AM   Peter [name] 1941-04-08 [id number]  Reason for visit: Follow up BPH/LUTS  HPI: I saw Peter [name] in urology clinic today in follow-up for BPH/LUTS.  He was previously a patient of Dr. Pilar Jarvis.  He is a 77 year old very comorbid male with extensive cardiac history including multiple stents on anticoagulation currently with Plavix and aspirin.  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  He underwent repeat biopsy in 2013 which was benign.  He is on maximal medical therapy with Flomax and finasteride.  At a previous visit, he had agreed to discontinue PSA screening secondary to his co-morbidities.  He currently is satisfied with his urination and feels like he is emptying well.  He occasionally has a weak stream but for the most part is emptying well.  His PVR in clinic today is 75 cc.  He denies any recurrence of gross hematuria.  He continues to smoke.  Prostate measures 123 g on CT  ROS: Please see flowsheet from today's date for complete review of systems.  Physical Exam: BP (!) 153/71   Pulse 79   Ht 5\' 10"  (1.778 m)   Wt 219 lb 1.6 oz (99.4 kg)   BMI 31.44 kg/m    Constitutional:  Alert and oriented, No acute distress. Respiratory: Normal respiratory effort, no increased work of breathing. GI: Abdomen is soft, nontender, nondistended, no abdominal masses GU: No CVA tenderness Skin: No rashes, bruises or suspicious lesions. Neurologic: Grossly intact, no focal deficits, moving all 4 extremities. Psychiatric: Normal mood and affect  Pertinent Imaging:  I have personally reviewed the CT abdomen pelvis without contrast dated 01/2016.  Prostate measures 123 g  Assessment & Plan:   In summary, [name] is an extremely comorbid 77 year old male with extensive cardiac history and multiple cardiac stents, on anticoagulation with aspirin and Plavix, and well-controlled urinary symptoms with  Flomax and finasteride.  His PVR is low in clinic today.  We discussed he would be a high risk surgical candidate for HOLEP, and we would not recommend proceeding with any surgical intervention unless he was in urinary retention.  We also discussed stopping PSA screening, as he has numerous comorbidities and the risks significantly outweigh the benefits.  We discussed the low, but not 0 risk of missing a clinically significant prostate cancer.  Flomax and finasteride refilled.   Return in about 1 year (around 02/15/2019) for PVR, IPSS.  Billey Co, [facility] [address], [place], [place] [address] [phone]

## 2018-03-18 DIAGNOSIS — Z95810 Presence of automatic (implantable) cardiac defibrillator: Secondary | ICD-10-CM | POA: Diagnosis not present

## 2018-03-18 DIAGNOSIS — I5022 Chronic systolic (congestive) heart failure: Secondary | ICD-10-CM | POA: Diagnosis not present

## 2018-03-25 DIAGNOSIS — Z85820 Personal history of malignant melanoma of skin: Secondary | ICD-10-CM | POA: Diagnosis not present

## 2018-03-25 DIAGNOSIS — D485 Neoplasm of uncertain behavior of skin: Secondary | ICD-10-CM | POA: Diagnosis not present

## 2018-03-25 DIAGNOSIS — C44319 Basal cell carcinoma of skin of other parts of face: Secondary | ICD-10-CM | POA: Diagnosis not present

## 2018-03-25 DIAGNOSIS — Z08 Encounter for follow-up examination after completed treatment for malignant neoplasm: Secondary | ICD-10-CM | POA: Diagnosis not present

## 2018-03-25 DIAGNOSIS — X32XXXA Exposure to sunlight, initial encounter: Secondary | ICD-10-CM | POA: Diagnosis not present

## 2018-03-25 DIAGNOSIS — Z872 Personal history of diseases of the skin and subcutaneous tissue: Secondary | ICD-10-CM | POA: Diagnosis not present

## 2018-03-25 DIAGNOSIS — C44619 Basal cell carcinoma of skin of left upper limb, including shoulder: Secondary | ICD-10-CM | POA: Diagnosis not present

## 2018-03-25 DIAGNOSIS — L570 Actinic keratosis: Secondary | ICD-10-CM | POA: Diagnosis not present

## 2018-03-25 DIAGNOSIS — Z85828 Personal history of other malignant neoplasm of skin: Secondary | ICD-10-CM | POA: Diagnosis not present

## 2018-03-25 DIAGNOSIS — Z09 Encounter for follow-up examination after completed treatment for conditions other than malignant neoplasm: Secondary | ICD-10-CM | POA: Diagnosis not present

## 2018-05-11 DIAGNOSIS — I712 Thoracic aortic aneurysm, without rupture: Secondary | ICD-10-CM | POA: Diagnosis not present

## 2018-05-11 DIAGNOSIS — R7309 Other abnormal glucose: Secondary | ICD-10-CM | POA: Diagnosis not present

## 2018-05-11 DIAGNOSIS — Z0000 Encounter for general adult medical examination without abnormal findings: Secondary | ICD-10-CM | POA: Diagnosis not present

## 2018-05-11 DIAGNOSIS — Z720 Tobacco use: Secondary | ICD-10-CM | POA: Diagnosis not present

## 2018-05-11 DIAGNOSIS — I10 Essential (primary) hypertension: Secondary | ICD-10-CM | POA: Diagnosis not present

## 2018-05-11 DIAGNOSIS — I255 Ischemic cardiomyopathy: Secondary | ICD-10-CM | POA: Diagnosis not present

## 2018-05-11 DIAGNOSIS — D649 Anemia, unspecified: Secondary | ICD-10-CM | POA: Diagnosis not present

## 2018-05-11 DIAGNOSIS — I5022 Chronic systolic (congestive) heart failure: Secondary | ICD-10-CM | POA: Diagnosis not present

## 2018-05-13 DIAGNOSIS — C4441 Basal cell carcinoma of skin of scalp and neck: Secondary | ICD-10-CM | POA: Diagnosis not present

## 2018-05-18 DIAGNOSIS — R7309 Other abnormal glucose: Secondary | ICD-10-CM | POA: Diagnosis not present

## 2018-05-18 DIAGNOSIS — Z85828 Personal history of other malignant neoplasm of skin: Secondary | ICD-10-CM | POA: Diagnosis not present

## 2018-05-18 DIAGNOSIS — M150 Primary generalized (osteo)arthritis: Secondary | ICD-10-CM | POA: Diagnosis not present

## 2018-05-18 DIAGNOSIS — N183 Chronic kidney disease, stage 3 unspecified: Secondary | ICD-10-CM | POA: Insufficient documentation

## 2018-05-18 DIAGNOSIS — Z720 Tobacco use: Secondary | ICD-10-CM | POA: Diagnosis not present

## 2018-05-18 DIAGNOSIS — I5022 Chronic systolic (congestive) heart failure: Secondary | ICD-10-CM | POA: Diagnosis not present

## 2018-05-18 DIAGNOSIS — I10 Essential (primary) hypertension: Secondary | ICD-10-CM | POA: Diagnosis not present

## 2018-05-18 DIAGNOSIS — Z95810 Presence of automatic (implantable) cardiac defibrillator: Secondary | ICD-10-CM | POA: Diagnosis not present

## 2018-05-18 DIAGNOSIS — I2510 Atherosclerotic heart disease of native coronary artery without angina pectoris: Secondary | ICD-10-CM | POA: Diagnosis not present

## 2018-05-18 DIAGNOSIS — I712 Thoracic aortic aneurysm, without rupture: Secondary | ICD-10-CM | POA: Diagnosis not present

## 2018-05-18 DIAGNOSIS — Z0000 Encounter for general adult medical examination without abnormal findings: Secondary | ICD-10-CM | POA: Diagnosis not present

## 2018-05-18 DIAGNOSIS — N400 Enlarged prostate without lower urinary tract symptoms: Secondary | ICD-10-CM | POA: Diagnosis not present

## 2018-05-20 DIAGNOSIS — C44319 Basal cell carcinoma of skin of other parts of face: Secondary | ICD-10-CM | POA: Diagnosis not present

## 2018-06-22 DIAGNOSIS — I447 Left bundle-branch block, unspecified: Secondary | ICD-10-CM | POA: Diagnosis not present

## 2018-06-22 DIAGNOSIS — Z95810 Presence of automatic (implantable) cardiac defibrillator: Secondary | ICD-10-CM | POA: Diagnosis not present

## 2018-06-22 DIAGNOSIS — I5022 Chronic systolic (congestive) heart failure: Secondary | ICD-10-CM | POA: Diagnosis not present

## 2018-06-22 DIAGNOSIS — J449 Chronic obstructive pulmonary disease, unspecified: Secondary | ICD-10-CM | POA: Diagnosis not present

## 2018-06-22 DIAGNOSIS — E785 Hyperlipidemia, unspecified: Secondary | ICD-10-CM | POA: Diagnosis not present

## 2018-06-22 DIAGNOSIS — I255 Ischemic cardiomyopathy: Secondary | ICD-10-CM | POA: Diagnosis not present

## 2018-06-22 DIAGNOSIS — R0609 Other forms of dyspnea: Secondary | ICD-10-CM | POA: Diagnosis not present

## 2018-06-22 DIAGNOSIS — G4733 Obstructive sleep apnea (adult) (pediatric): Secondary | ICD-10-CM | POA: Diagnosis not present

## 2018-06-22 DIAGNOSIS — F17200 Nicotine dependence, unspecified, uncomplicated: Secondary | ICD-10-CM | POA: Diagnosis not present

## 2018-06-22 DIAGNOSIS — E669 Obesity, unspecified: Secondary | ICD-10-CM | POA: Diagnosis not present

## 2018-06-22 DIAGNOSIS — I739 Peripheral vascular disease, unspecified: Secondary | ICD-10-CM | POA: Diagnosis not present

## 2018-06-22 DIAGNOSIS — Z955 Presence of coronary angioplasty implant and graft: Secondary | ICD-10-CM | POA: Diagnosis not present

## 2018-08-23 DIAGNOSIS — Z85820 Personal history of malignant melanoma of skin: Secondary | ICD-10-CM | POA: Diagnosis not present

## 2018-08-23 DIAGNOSIS — L538 Other specified erythematous conditions: Secondary | ICD-10-CM | POA: Diagnosis not present

## 2018-08-23 DIAGNOSIS — D2271 Melanocytic nevi of right lower limb, including hip: Secondary | ICD-10-CM | POA: Diagnosis not present

## 2018-08-23 DIAGNOSIS — L820 Inflamed seborrheic keratosis: Secondary | ICD-10-CM | POA: Diagnosis not present

## 2018-08-23 DIAGNOSIS — L821 Other seborrheic keratosis: Secondary | ICD-10-CM | POA: Diagnosis not present

## 2018-08-23 DIAGNOSIS — L570 Actinic keratosis: Secondary | ICD-10-CM | POA: Diagnosis not present

## 2018-08-23 DIAGNOSIS — Z85828 Personal history of other malignant neoplasm of skin: Secondary | ICD-10-CM | POA: Diagnosis not present

## 2018-08-23 DIAGNOSIS — Z08 Encounter for follow-up examination after completed treatment for malignant neoplasm: Secondary | ICD-10-CM | POA: Diagnosis not present

## 2018-08-23 DIAGNOSIS — X32XXXA Exposure to sunlight, initial encounter: Secondary | ICD-10-CM | POA: Diagnosis not present

## 2018-08-23 DIAGNOSIS — L853 Xerosis cutis: Secondary | ICD-10-CM | POA: Diagnosis not present

## 2018-08-31 DIAGNOSIS — H353232 Exudative age-related macular degeneration, bilateral, with inactive choroidal neovascularization: Secondary | ICD-10-CM | POA: Diagnosis not present

## 2018-09-09 DIAGNOSIS — I4891 Unspecified atrial fibrillation: Secondary | ICD-10-CM | POA: Diagnosis not present

## 2018-09-09 DIAGNOSIS — Z4502 Encounter for adjustment and management of automatic implantable cardiac defibrillator: Secondary | ICD-10-CM | POA: Diagnosis not present

## 2018-09-09 DIAGNOSIS — I471 Supraventricular tachycardia: Secondary | ICD-10-CM | POA: Diagnosis not present

## 2018-09-09 DIAGNOSIS — Z95810 Presence of automatic (implantable) cardiac defibrillator: Secondary | ICD-10-CM | POA: Diagnosis not present

## 2018-09-30 ENCOUNTER — Telehealth: Payer: Self-pay | Admitting: Urology

## 2018-09-30 NOTE — Telephone Encounter (Signed)
Called pt informed him of several refills on both finasteride and tamsulosin at the [facility] on [address].Pt states that he gets his medications at the [facility] on [address]. Called the pharmacy and left message for them to transfer pt's medications over to them.

## 2018-09-30 NOTE — Telephone Encounter (Signed)
Pt calls office stating he is out of his Tamsulosin 0.4mg , has no urinary problems during the day but seems to be getting up every 2-3 hrs during the night with trouble starting urination. Pt denies any signs or symptoms of burning, frequency, fever, chills, nausea/vomiting. Asks for a refill to be sent to [facility] at [place] at the [facility] location as listed in his chart. Please advise pt at [phone] Pt would like to know when this has been sent in.   Pt states without this med he has ended up hospitalized 11 times with infection. He asks to please send in as soon as possible. Thanks.

## 2018-11-24 DIAGNOSIS — S51011A Laceration without foreign body of right elbow, initial encounter: Secondary | ICD-10-CM | POA: Diagnosis not present

## 2018-11-24 DIAGNOSIS — Z7901 Long term (current) use of anticoagulants: Secondary | ICD-10-CM | POA: Diagnosis not present

## 2018-12-06 DIAGNOSIS — Z720 Tobacco use: Secondary | ICD-10-CM | POA: Diagnosis not present

## 2018-12-06 DIAGNOSIS — Z125 Encounter for screening for malignant neoplasm of prostate: Secondary | ICD-10-CM | POA: Diagnosis not present

## 2018-12-06 DIAGNOSIS — R7309 Other abnormal glucose: Secondary | ICD-10-CM | POA: Diagnosis not present

## 2018-12-06 DIAGNOSIS — M8949 Other hypertrophic osteoarthropathy, multiple sites: Secondary | ICD-10-CM | POA: Diagnosis not present

## 2018-12-06 DIAGNOSIS — I5022 Chronic systolic (congestive) heart failure: Secondary | ICD-10-CM | POA: Diagnosis not present

## 2018-12-06 DIAGNOSIS — N183 Chronic kidney disease, stage 3 (moderate): Secondary | ICD-10-CM | POA: Diagnosis not present

## 2018-12-06 DIAGNOSIS — Z85828 Personal history of other malignant neoplasm of skin: Secondary | ICD-10-CM | POA: Diagnosis not present

## 2018-12-06 DIAGNOSIS — N400 Enlarged prostate without lower urinary tract symptoms: Secondary | ICD-10-CM | POA: Diagnosis not present

## 2018-12-06 DIAGNOSIS — Z95810 Presence of automatic (implantable) cardiac defibrillator: Secondary | ICD-10-CM | POA: Diagnosis not present

## 2018-12-06 DIAGNOSIS — I10 Essential (primary) hypertension: Secondary | ICD-10-CM | POA: Diagnosis not present

## 2018-12-06 DIAGNOSIS — Z0000 Encounter for general adult medical examination without abnormal findings: Secondary | ICD-10-CM | POA: Diagnosis not present

## 2018-12-06 DIAGNOSIS — I2510 Atherosclerotic heart disease of native coronary artery without angina pectoris: Secondary | ICD-10-CM | POA: Diagnosis not present

## 2018-12-06 DIAGNOSIS — I712 Thoracic aortic aneurysm, without rupture: Secondary | ICD-10-CM | POA: Diagnosis not present

## 2018-12-09 DIAGNOSIS — D0439 Carcinoma in situ of skin of other parts of face: Secondary | ICD-10-CM | POA: Diagnosis not present

## 2018-12-09 DIAGNOSIS — D485 Neoplasm of uncertain behavior of skin: Secondary | ICD-10-CM | POA: Diagnosis not present

## 2018-12-13 DIAGNOSIS — R0602 Shortness of breath: Secondary | ICD-10-CM | POA: Diagnosis not present

## 2018-12-13 DIAGNOSIS — Z4502 Encounter for adjustment and management of automatic implantable cardiac defibrillator: Secondary | ICD-10-CM | POA: Diagnosis not present

## 2018-12-13 DIAGNOSIS — I255 Ischemic cardiomyopathy: Secondary | ICD-10-CM | POA: Diagnosis not present

## 2018-12-13 DIAGNOSIS — Z0000 Encounter for general adult medical examination without abnormal findings: Secondary | ICD-10-CM | POA: Diagnosis not present

## 2018-12-13 DIAGNOSIS — R05 Cough: Secondary | ICD-10-CM | POA: Diagnosis not present

## 2018-12-13 DIAGNOSIS — Z7902 Long term (current) use of antithrombotics/antiplatelets: Secondary | ICD-10-CM | POA: Diagnosis not present

## 2018-12-13 DIAGNOSIS — I130 Hypertensive heart and chronic kidney disease with heart failure and stage 1 through stage 4 chronic kidney disease, or unspecified chronic kidney disease: Secondary | ICD-10-CM | POA: Diagnosis not present

## 2018-12-13 DIAGNOSIS — F17210 Nicotine dependence, cigarettes, uncomplicated: Secondary | ICD-10-CM | POA: Diagnosis not present

## 2018-12-13 DIAGNOSIS — F17200 Nicotine dependence, unspecified, uncomplicated: Secondary | ICD-10-CM | POA: Diagnosis not present

## 2018-12-13 DIAGNOSIS — Z95810 Presence of automatic (implantable) cardiac defibrillator: Secondary | ICD-10-CM | POA: Diagnosis not present

## 2018-12-13 DIAGNOSIS — R972 Elevated prostate specific antigen [PSA]: Secondary | ICD-10-CM | POA: Diagnosis not present

## 2018-12-13 DIAGNOSIS — N183 Chronic kidney disease, stage 3 (moderate): Secondary | ICD-10-CM | POA: Diagnosis not present

## 2018-12-13 DIAGNOSIS — K219 Gastro-esophageal reflux disease without esophagitis: Secondary | ICD-10-CM | POA: Diagnosis not present

## 2018-12-13 DIAGNOSIS — I10 Essential (primary) hypertension: Secondary | ICD-10-CM | POA: Diagnosis not present

## 2018-12-13 DIAGNOSIS — I5022 Chronic systolic (congestive) heart failure: Secondary | ICD-10-CM | POA: Diagnosis not present

## 2018-12-13 DIAGNOSIS — R7309 Other abnormal glucose: Secondary | ICD-10-CM | POA: Diagnosis not present

## 2018-12-13 DIAGNOSIS — E785 Hyperlipidemia, unspecified: Secondary | ICD-10-CM | POA: Diagnosis not present

## 2018-12-19 DIAGNOSIS — Z4502 Encounter for adjustment and management of automatic implantable cardiac defibrillator: Secondary | ICD-10-CM | POA: Insufficient documentation

## 2019-01-03 DIAGNOSIS — E785 Hyperlipidemia, unspecified: Secondary | ICD-10-CM | POA: Diagnosis not present

## 2019-01-03 DIAGNOSIS — I2510 Atherosclerotic heart disease of native coronary artery without angina pectoris: Secondary | ICD-10-CM | POA: Diagnosis not present

## 2019-01-03 DIAGNOSIS — F17200 Nicotine dependence, unspecified, uncomplicated: Secondary | ICD-10-CM | POA: Diagnosis not present

## 2019-01-03 DIAGNOSIS — Z9889 Other specified postprocedural states: Secondary | ICD-10-CM | POA: Diagnosis not present

## 2019-01-03 DIAGNOSIS — E669 Obesity, unspecified: Secondary | ICD-10-CM | POA: Diagnosis not present

## 2019-01-03 DIAGNOSIS — Z95810 Presence of automatic (implantable) cardiac defibrillator: Secondary | ICD-10-CM | POA: Diagnosis not present

## 2019-01-03 DIAGNOSIS — G4733 Obstructive sleep apnea (adult) (pediatric): Secondary | ICD-10-CM | POA: Diagnosis not present

## 2019-01-03 DIAGNOSIS — J449 Chronic obstructive pulmonary disease, unspecified: Secondary | ICD-10-CM | POA: Diagnosis not present

## 2019-01-03 DIAGNOSIS — I739 Peripheral vascular disease, unspecified: Secondary | ICD-10-CM | POA: Diagnosis not present

## 2019-01-03 DIAGNOSIS — I255 Ischemic cardiomyopathy: Secondary | ICD-10-CM | POA: Diagnosis not present

## 2019-01-03 DIAGNOSIS — I5022 Chronic systolic (congestive) heart failure: Secondary | ICD-10-CM | POA: Diagnosis not present

## 2019-01-03 DIAGNOSIS — R0600 Dyspnea, unspecified: Secondary | ICD-10-CM | POA: Diagnosis not present

## 2019-02-01 DIAGNOSIS — D0439 Carcinoma in situ of skin of other parts of face: Secondary | ICD-10-CM | POA: Diagnosis not present

## 2019-02-20 ENCOUNTER — Ambulatory Visit: Payer: PPO | Admitting: Urology

## 2019-02-20 ENCOUNTER — Encounter: Payer: Self-pay | Admitting: Urology

## 2019-02-20 ENCOUNTER — Other Ambulatory Visit: Payer: Self-pay

## 2019-02-20 VITALS — BP 137/63 | HR 78 | Ht 72.0 in | Wt 218.0 lb

## 2019-02-20 DIAGNOSIS — N400 Enlarged prostate without lower urinary tract symptoms: Secondary | ICD-10-CM

## 2019-02-20 LAB — BLADDER SCAN AMB NON-IMAGING

## 2019-02-20 MED ORDER — TAMSULOSIN HCL 0.4 MG PO CAPS: 0.4000 mg | ORAL_CAPSULE | Freq: Every day | ORAL | 11 refills | Status: DC

## 2019-02-20 MED ORDER — FINASTERIDE 5 MG PO TABS: 5.0000 mg | ORAL_TABLET | Freq: Every day | ORAL | 11 refills | Status: DC

## 2019-02-20 NOTE — Patient Instructions (Signed)

## 2019-02-20 NOTE — Progress Notes (Signed)
   02/20/2019 10:34 AM   Peter [name] [birth year] [id number]  Reason for visit: Follow up BPH and urinary symptoms  HPI: I saw [name] back in urology clinic for BPH and urinary symptoms.  He was previously followed by Dr. Pilar Jarvis.  He is a 78 year old comorbid male with stents of cardiac history, multiple vascular stents, and new pacemaker, anticoagulated with aspirin and Plavix.  He has a large 123 g prostate on prior CT and has chronic urinary symptoms of weak stream and difficulty starting stream.  He is on maximal medical therapy with Flomax and finasteride.  He reports has been doing well over the last year and denies any gross hematuria or any worsening of his urinary symptoms.  His primary issue is some post void dribbling, but he really is minimally bothered by his symptoms at this time.  We discussed he is not good candidate for an outlet procedure with his comorbidities and anticoagulation and extensive cardiac history.  Prostatic artery embolization could be considered, but would not be ideal either with his CKD.  Flomax and finasteride refilled RTC 1 year symptom check  A total of 15 minutes were spent face-to-face with the patient, greater than 50% was spent in patient education, counseling, and coordination of care regarding BPH and urinary symptoms.   Billey Co, [facility] [address], [place], [place] [address] [phone]

## 2019-02-24 DIAGNOSIS — L570 Actinic keratosis: Secondary | ICD-10-CM | POA: Diagnosis not present

## 2019-02-24 DIAGNOSIS — Z08 Encounter for follow-up examination after completed treatment for malignant neoplasm: Secondary | ICD-10-CM | POA: Diagnosis not present

## 2019-02-24 DIAGNOSIS — C44212 Basal cell carcinoma of skin of right ear and external auricular canal: Secondary | ICD-10-CM | POA: Diagnosis not present

## 2019-02-24 DIAGNOSIS — Z85828 Personal history of other malignant neoplasm of skin: Secondary | ICD-10-CM | POA: Diagnosis not present

## 2019-02-24 DIAGNOSIS — D1801 Hemangioma of skin and subcutaneous tissue: Secondary | ICD-10-CM | POA: Diagnosis not present

## 2019-02-24 DIAGNOSIS — D2262 Melanocytic nevi of left upper limb, including shoulder: Secondary | ICD-10-CM | POA: Diagnosis not present

## 2019-02-24 DIAGNOSIS — X32XXXA Exposure to sunlight, initial encounter: Secondary | ICD-10-CM | POA: Diagnosis not present

## 2019-02-24 DIAGNOSIS — D485 Neoplasm of uncertain behavior of skin: Secondary | ICD-10-CM | POA: Diagnosis not present

## 2019-02-24 DIAGNOSIS — D2271 Melanocytic nevi of right lower limb, including hip: Secondary | ICD-10-CM | POA: Diagnosis not present

## 2019-02-24 DIAGNOSIS — Z85820 Personal history of malignant melanoma of skin: Secondary | ICD-10-CM | POA: Diagnosis not present

## 2019-02-24 DIAGNOSIS — D225 Melanocytic nevi of trunk: Secondary | ICD-10-CM | POA: Diagnosis not present

## 2019-03-01 DIAGNOSIS — H353232 Exudative age-related macular degeneration, bilateral, with inactive choroidal neovascularization: Secondary | ICD-10-CM | POA: Diagnosis not present

## 2019-03-16 DIAGNOSIS — Z4502 Encounter for adjustment and management of automatic implantable cardiac defibrillator: Secondary | ICD-10-CM | POA: Diagnosis not present

## 2019-03-16 DIAGNOSIS — Z95810 Presence of automatic (implantable) cardiac defibrillator: Secondary | ICD-10-CM | POA: Diagnosis not present

## 2019-03-16 DIAGNOSIS — I252 Old myocardial infarction: Secondary | ICD-10-CM | POA: Diagnosis not present

## 2019-03-16 DIAGNOSIS — I10 Essential (primary) hypertension: Secondary | ICD-10-CM | POA: Diagnosis not present

## 2019-03-16 DIAGNOSIS — F17210 Nicotine dependence, cigarettes, uncomplicated: Secondary | ICD-10-CM | POA: Diagnosis not present

## 2019-03-16 DIAGNOSIS — E785 Hyperlipidemia, unspecified: Secondary | ICD-10-CM | POA: Diagnosis not present

## 2019-03-16 DIAGNOSIS — I716 Thoracoabdominal aortic aneurysm, without rupture: Secondary | ICD-10-CM | POA: Diagnosis not present

## 2019-03-16 DIAGNOSIS — I5022 Chronic systolic (congestive) heart failure: Secondary | ICD-10-CM | POA: Diagnosis not present

## 2019-03-16 DIAGNOSIS — I2510 Atherosclerotic heart disease of native coronary artery without angina pectoris: Secondary | ICD-10-CM | POA: Diagnosis not present

## 2019-03-17 DIAGNOSIS — Z95810 Presence of automatic (implantable) cardiac defibrillator: Secondary | ICD-10-CM | POA: Diagnosis not present

## 2019-03-21 ENCOUNTER — Encounter: Payer: Self-pay | Admitting: Physician Assistant

## 2019-03-21 ENCOUNTER — Ambulatory Visit (INDEPENDENT_AMBULATORY_CARE_PROVIDER_SITE_OTHER): Payer: PPO | Admitting: Physician Assistant

## 2019-03-21 ENCOUNTER — Other Ambulatory Visit: Payer: Self-pay

## 2019-03-21 VITALS — BP 196/84 | HR 79 | Ht 72.0 in | Wt 218.0 lb

## 2019-03-21 DIAGNOSIS — N401 Enlarged prostate with lower urinary tract symptoms: Secondary | ICD-10-CM | POA: Diagnosis not present

## 2019-03-21 DIAGNOSIS — R350 Frequency of micturition: Secondary | ICD-10-CM

## 2019-03-21 DIAGNOSIS — R300 Dysuria: Secondary | ICD-10-CM | POA: Diagnosis not present

## 2019-03-21 DIAGNOSIS — R3129 Other microscopic hematuria: Secondary | ICD-10-CM

## 2019-03-21 LAB — URINALYSIS, COMPLETE
Bilirubin, UA: NEGATIVE
Glucose, UA: NEGATIVE
Ketones, UA: NEGATIVE
Leukocytes,UA: NEGATIVE
Nitrite, UA: NEGATIVE
Specific Gravity, UA: 1.025 (ref 1.005–1.030)
Urobilinogen, Ur: 0.2 mg/dL (ref 0.2–1.0)
pH, UA: 6.5 (ref 5.0–7.5)

## 2019-03-21 LAB — MICROSCOPIC EXAMINATION: Bacteria, UA: NONE SEEN

## 2019-03-21 LAB — BLADDER SCAN AMB NON-IMAGING

## 2019-03-21 NOTE — Progress Notes (Signed)
03/21/2019 11:13 AM   Peter [name] 09/16/1940 [id number]  CC: Urgency, frequency, odd sensation of bladder  HPI: Peter [name] is a 78 y.o. male who presents today for evaluation of possible UTI. He is an established [facility] patient who last saw Dr. Diamantina Providence on 02/20/2019 for follow-up of BPH with LUTS including weak stream, hesitancy, and post void dribbling, on Flomax and finasteride.  Today, he reports urinary urgency, frequency, and an unusual bladder sensation x5 days.  He states his symptoms began following IV contrast administration for a CTA chest abdomen and pelvis.  He denies gross hematuria, fever, chills, and a history of nephrolithiasis.  In-office UA today positive for 1+ blood and 2+ protein; urine microscopy with 3-10 RBCs/HPF. PVR 9mL.  Of note, he reports an approximate 62-pack-year smoking history.  PMH: Past Medical History:  Diagnosis Date  . Anginal pain ([place])   . Asthma   . Bladder infection, acute 03/2011   "had a whole lot of bleeding from this"  . CHF (congestive heart failure) ([place])   . Coronary artery disease   . High cholesterol   . Hypertension   . 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 Myocardial infarction ([place]) 1996  . NSTEMI (non-ST elevated myocardial infarction) ([place]) 12/14/2011  . Shortness of breath 12/14/2011   "@ rest, lying down, w/exertion"    Surgical History: Past Surgical History:  Procedure Laterality Date  . [place]; ~ 2003; ~ 2008   "total of 3"  . CORONARY STENT INTERVENTION N/A 08/10/2016   Procedure: Coronary Stent Intervention;  Surgeon: [name], MD;  Location: [facility];  Service: Cardiovascular;  Laterality: N/A;  . HERNIA REPAIR  ~ 2001   "abdominal w/mesh implanted"  . LEFT HEART CATH AND CORONARY ANGIOGRAPHY N/A 08/10/2016   Procedure: Left Heart Cath and Coronary Angiography;  Surgeon: [name], MD;  Location: [facility];  Service: Cardiovascular;  Laterality: N/A;  . LEFT HEART CATHETERIZATION WITH CORONARY ANGIOGRAM N/A 12/16/2011   Procedure: LEFT HEART CATHETERIZATION WITH CORONARY ANGIOGRAM;  Surgeon: Minus Breeding, MD;  Location: [facility] CATH LAB;  Service: Cardiovascular;  Laterality: N/A;  . PERCUTANEOUS CORONARY STENT INTERVENTION (PCI-S) N/A 12/17/2011   Procedure: PERCUTANEOUS CORONARY STENT INTERVENTION (PCI-S);  Surgeon: Sherren Mocha, MD;  Location: [facility] CATH LAB;  Service: Cardiovascular;  Laterality: N/A;  . TONSILLECTOMY     "I was a kid"    Home Medications:  Allergies as of 03/21/2019      Reactions   Iodinated Diagnostic Agents Shortness Of Breath   Iodinated Glycerol  [glycerol, Iodinated] Shortness Of Breath      Medication List       Accurate as of March 21, 2019 11:13 AM. If you have any questions, ask your nurse or doctor.        amLODipine-benazepril 10-40 MG capsule Commonly known as: LOTREL TAKE 1 CAPSULE BY MOUTH EVERY DAY   aspirin 325 MG tablet Take 325 mg by mouth 2 (two) times daily. Patient takes if there is any pain or burning in the chest.   atorvastatin 40 MG tablet Commonly known as: LIPITOR Take 1 tablet (40 mg total) by mouth daily at 6 PM.   carvedilol 3.125 MG tablet Commonly known as: COREG Take 3.125 mg by mouth 2 (two) times daily with a meal.   clobetasol cream 0.05 % Commonly known as: TEMOVATE APPLY TO  PSORIASIS BID PRN UNTIL SMOOTH   clopidogrel 75 MG tablet Commonly known as: PLAVIX TAKE 1 TABLET(75 MG) BY MOUTH EVERY DAY   cyanocobalamin 1000 MCG tablet Take 1,000 mcg by mouth daily.   finasteride 5 MG tablet Commonly known as: PROSCAR Take 1 tablet (5 mg total) by mouth daily.   furosemide 20 MG tablet Commonly known as: Lasix Take 1 tablet (20 mg total) by mouth daily. What changed: when to take this   GLUCOSAMINE-CHONDROITIN-VIT C PO Take 1 tablet by mouth daily.    hydrochlorothiazide 12.5 MG tablet Commonly known as: HYDRODIURIL Take 12.5 mg by mouth daily.   isosorbide mononitrate 30 MG 24 hr tablet Commonly known as: IMDUR Take 30 mg by mouth daily.   omeprazole 20 MG capsule Commonly known as: PRILOSEC Take by mouth.   tamsulosin 0.4 MG Caps capsule Commonly known as: FLOMAX Take 1 capsule (0.4 mg total) by mouth daily.   Wal-Zyr 10 MG tablet Generic drug: cetirizine Take 10 mg by mouth.       Allergies:  Allergies  Allergen Reactions  . Iodinated Diagnostic Agents Shortness Of Breath  . Iodinated Glycerol  [Glycerol, Iodinated] Shortness Of Breath    Family History: Family History  Family history unknown: Yes    Social History:   reports that he has been smoking cigarettes. He has a 0.25 pack-year smoking history. He has never used smokeless tobacco. He reports current alcohol use of about 1.0 standard drinks of alcohol per week. He reports that he does not use drugs.  ROS: UROLOGY Frequent Urination?: Yes Hard to postpone urination?: Yes Burning/pain with urination?: Yes Get up at night to urinate?: Yes Leakage of urine?: Yes Urine stream starts and stops?: Yes Trouble starting stream?: Yes Do you have to strain to urinate?: Yes Blood in urine?: No Urinary tract infection?: Yes Sexually transmitted disease?: No Injury to kidneys or bladder?: No Painful intercourse?: No Weak stream?: Yes Erection problems?: Yes Penile pain?: No  Gastrointestinal Nausea?: No Vomiting?: No Indigestion/heartburn?: No Diarrhea?: No Constipation?: No  Constitutional Fever: No Night sweats?: No Weight loss?: No Fatigue?: No  Skin Skin rash/lesions?: No Itching?: No  Eyes Blurred vision?: No Double vision?: No  Ears/Nose/Throat Sore throat?: No Sinus problems?: No  Hematologic/Lymphatic Swollen glands?: No Easy bruising?: No  Cardiovascular Leg swelling?: No Chest pain?: No  Respiratory Cough?: No  Shortness of breath?: No  Endocrine Excessive thirst?: No  Musculoskeletal Back pain?: No Joint pain?: No  Neurological Headaches?: No Dizziness?: No  Psychologic Depression?: No Anxiety?: No  Physical Exam: BP (!) 196/84   Pulse 79   Ht 6' (1.829 m)   Wt 218 lb (98.9 kg)   BMI 29.57 kg/m   Constitutional:  Alert and oriented, no acute distress, nontoxic appearing HEENT: [place], AT Cardiovascular: No clubbing, cyanosis, or edema Respiratory: Normal respiratory effort, no increased work of breathing Skin: No rashes, bruises or suspicious lesions Neurologic: Grossly intact, no focal deficits, moving all 4 extremities Psychiatric: Normal mood and affect  Laboratory Data: Results for orders placed or performed in visit on 03/21/19  Microscopic Examination   URINE  Result Value Ref Range   WBC, UA 0-5 0 - 5 /hpf   RBC 3-10 (A) 0 - 2 /hpf   Epithelial Cells (non renal) 0-10 0 - 10 /hpf   Bacteria, UA None seen None seen/Few  Urinalysis, Complete  Result Value Ref Range   Specific Gravity, UA 1.025 1.005 - 1.030   pH, UA 6.5 5.0 -  7.5   Color, UA Yellow Yellow   Appearance Ur Clear Clear   Leukocytes,UA Negative Negative   Protein,UA 2+ (A) Negative/Trace   Glucose, UA Negative Negative   Ketones, UA Negative Negative   RBC, UA 1+ (A) Negative   Bilirubin, UA Negative Negative   Urobilinogen, Ur 0.2 0.2 - 1.0 mg/dL   Nitrite, UA Negative Negative   Microscopic Examination See below:   Bladder Scan (Post Void Residual) in office  Result Value Ref Range   Scan Result 50ML    Assessment & Plan:   1. Urinary frequency 78 year old male with PMH BPH with LUTS here with a 5-day history of urgency, frequency, and an unusual bladder sensation.  UA significant for microscopic hematuria without markers for infection.  Will send for culture.  No antibiotics today, I advised him that I would contact him if I needed to treat him for an infection.  He expressed understanding. -  Urinalysis, Complete - CULTURE, URINE COMPREHENSIVE  2. Benign prostatic hyperplasia with lower urinary tract symptoms, symptom details unspecified PVR WNL today.  I do not believe urinary retention is contributing to his presentation today - Bladder Scan (Post Void Residual) in office  3. Microscopic hematuria UA significant for microscopic hematuria today with a 62-pack-year smoking history.  If urine culture is positive, will have patient return to clinic for repeat UA following treatment to prove resolution of this.  If MH persists or if urine culture is negative, we will schedule him for hematuria work-up.  Return if symptoms worsen or fail to improve.  Debroah Loop, PA-C  [facility] [address] [place], [place] [address] [phone]

## 2019-03-24 ENCOUNTER — Other Ambulatory Visit: Payer: Self-pay | Admitting: Internal Medicine

## 2019-03-24 DIAGNOSIS — R0602 Shortness of breath: Secondary | ICD-10-CM

## 2019-03-25 LAB — CULTURE, URINE COMPREHENSIVE

## 2019-03-27 ENCOUNTER — Telehealth: Payer: Self-pay | Admitting: Physician Assistant

## 2019-03-27 ENCOUNTER — Other Ambulatory Visit: Payer: Self-pay | Admitting: Physician Assistant

## 2019-03-27 DIAGNOSIS — D485 Neoplasm of uncertain behavior of skin: Secondary | ICD-10-CM | POA: Diagnosis not present

## 2019-03-27 DIAGNOSIS — L723 Sebaceous cyst: Secondary | ICD-10-CM | POA: Diagnosis not present

## 2019-03-27 DIAGNOSIS — R3129 Other microscopic hematuria: Secondary | ICD-10-CM

## 2019-03-27 MED ORDER — SULFAMETHOXAZOLE-TRIMETHOPRIM 800-160 MG PO TABS: 1.0000 | ORAL_TABLET | Freq: Two times a day (BID) | ORAL | 0 refills | Status: AC

## 2019-03-27 NOTE — Telephone Encounter (Signed)
Patient's urine culture resulted with low counts of Staph lugdunensis.  Will treat given irritative symptoms and MH.  Patient has renal impairment, however creatinine clearance is estimated at greater than 50 mL/min.  Prescribing Bactrim DS twice daily x5 days today.  No dose adjustment for renal impairment indicated.  I spoke with the patient via telephone to inform him of this. He expressed understanding.  Please contact patient to schedule him for lab visit in 1 week for repeat UA to prove resolution of microscopic hematuria.  UA order already in. If MH has not resolved, will proceed with hematuria work-up.

## 2019-03-27 NOTE — Telephone Encounter (Signed)
Appt made and pt confirmed. °

## 2019-04-03 ENCOUNTER — Other Ambulatory Visit: Payer: Self-pay

## 2019-04-03 ENCOUNTER — Other Ambulatory Visit: Payer: PPO

## 2019-04-03 DIAGNOSIS — R3129 Other microscopic hematuria: Secondary | ICD-10-CM

## 2019-04-03 DIAGNOSIS — C44212 Basal cell carcinoma of skin of right ear and external auricular canal: Secondary | ICD-10-CM | POA: Diagnosis not present

## 2019-04-03 LAB — MICROSCOPIC EXAMINATION
Bacteria, UA: NONE SEEN
WBC, UA: NONE SEEN /hpf (ref 0–5)

## 2019-04-03 LAB — URINALYSIS, COMPLETE
Bilirubin, UA: NEGATIVE
Glucose, UA: NEGATIVE
Ketones, UA: NEGATIVE
Leukocytes,UA: NEGATIVE
Nitrite, UA: NEGATIVE
Specific Gravity, UA: 1.02 (ref 1.005–1.030)
Urobilinogen, Ur: 0.2 mg/dL (ref 0.2–1.0)
pH, UA: 6 (ref 5.0–7.5)

## 2019-04-04 ENCOUNTER — Ambulatory Visit: Payer: PPO | Attending: Internal Medicine

## 2019-04-11 ENCOUNTER — Ambulatory Visit: Payer: PPO

## 2019-04-12 DIAGNOSIS — Z20828 Contact with and (suspected) exposure to other viral communicable diseases: Secondary | ICD-10-CM | POA: Diagnosis not present

## 2019-04-12 DIAGNOSIS — I255 Ischemic cardiomyopathy: Secondary | ICD-10-CM | POA: Diagnosis not present

## 2019-04-12 DIAGNOSIS — I5022 Chronic systolic (congestive) heart failure: Secondary | ICD-10-CM | POA: Diagnosis not present

## 2019-06-15 DIAGNOSIS — I255 Ischemic cardiomyopathy: Secondary | ICD-10-CM | POA: Diagnosis not present

## 2019-06-15 DIAGNOSIS — I5022 Chronic systolic (congestive) heart failure: Secondary | ICD-10-CM | POA: Diagnosis not present

## 2019-06-15 DIAGNOSIS — N1832 Chronic kidney disease, stage 3b: Secondary | ICD-10-CM | POA: Diagnosis not present

## 2019-06-15 DIAGNOSIS — Z0000 Encounter for general adult medical examination without abnormal findings: Secondary | ICD-10-CM | POA: Diagnosis not present

## 2019-06-15 DIAGNOSIS — Z720 Tobacco use: Secondary | ICD-10-CM | POA: Diagnosis not present

## 2019-06-15 DIAGNOSIS — I10 Essential (primary) hypertension: Secondary | ICD-10-CM | POA: Diagnosis not present

## 2019-06-15 DIAGNOSIS — I2510 Atherosclerotic heart disease of native coronary artery without angina pectoris: Secondary | ICD-10-CM | POA: Diagnosis not present

## 2019-06-28 DIAGNOSIS — E669 Obesity, unspecified: Secondary | ICD-10-CM | POA: Diagnosis not present

## 2019-06-28 DIAGNOSIS — E785 Hyperlipidemia, unspecified: Secondary | ICD-10-CM | POA: Diagnosis not present

## 2019-06-28 DIAGNOSIS — J449 Chronic obstructive pulmonary disease, unspecified: Secondary | ICD-10-CM | POA: Diagnosis not present

## 2019-06-28 DIAGNOSIS — I255 Ischemic cardiomyopathy: Secondary | ICD-10-CM | POA: Diagnosis not present

## 2019-06-28 DIAGNOSIS — I739 Peripheral vascular disease, unspecified: Secondary | ICD-10-CM | POA: Diagnosis not present

## 2019-06-28 DIAGNOSIS — G4733 Obstructive sleep apnea (adult) (pediatric): Secondary | ICD-10-CM | POA: Diagnosis not present

## 2019-06-28 DIAGNOSIS — R0600 Dyspnea, unspecified: Secondary | ICD-10-CM | POA: Diagnosis not present

## 2019-06-28 DIAGNOSIS — Z9889 Other specified postprocedural states: Secondary | ICD-10-CM | POA: Diagnosis not present

## 2019-06-28 DIAGNOSIS — Z955 Presence of coronary angioplasty implant and graft: Secondary | ICD-10-CM | POA: Diagnosis not present

## 2019-06-28 DIAGNOSIS — Z95810 Presence of automatic (implantable) cardiac defibrillator: Secondary | ICD-10-CM | POA: Diagnosis not present

## 2019-06-28 DIAGNOSIS — F17200 Nicotine dependence, unspecified, uncomplicated: Secondary | ICD-10-CM | POA: Diagnosis not present

## 2019-06-28 DIAGNOSIS — I5022 Chronic systolic (congestive) heart failure: Secondary | ICD-10-CM | POA: Diagnosis not present

## 2019-07-28 DIAGNOSIS — Z85820 Personal history of malignant melanoma of skin: Secondary | ICD-10-CM | POA: Diagnosis not present

## 2019-07-28 DIAGNOSIS — L821 Other seborrheic keratosis: Secondary | ICD-10-CM | POA: Diagnosis not present

## 2019-07-28 DIAGNOSIS — Z85828 Personal history of other malignant neoplasm of skin: Secondary | ICD-10-CM | POA: Diagnosis not present

## 2019-07-28 DIAGNOSIS — L570 Actinic keratosis: Secondary | ICD-10-CM | POA: Diagnosis not present

## 2019-07-28 DIAGNOSIS — D225 Melanocytic nevi of trunk: Secondary | ICD-10-CM | POA: Diagnosis not present

## 2019-07-28 DIAGNOSIS — L820 Inflamed seborrheic keratosis: Secondary | ICD-10-CM | POA: Diagnosis not present

## 2019-07-28 DIAGNOSIS — D2261 Melanocytic nevi of right upper limb, including shoulder: Secondary | ICD-10-CM | POA: Diagnosis not present

## 2019-07-28 DIAGNOSIS — D2271 Melanocytic nevi of right lower limb, including hip: Secondary | ICD-10-CM | POA: Diagnosis not present

## 2019-07-28 DIAGNOSIS — X32XXXA Exposure to sunlight, initial encounter: Secondary | ICD-10-CM | POA: Diagnosis not present

## 2019-07-28 DIAGNOSIS — D2262 Melanocytic nevi of left upper limb, including shoulder: Secondary | ICD-10-CM | POA: Diagnosis not present

## 2019-07-28 DIAGNOSIS — L538 Other specified erythematous conditions: Secondary | ICD-10-CM | POA: Diagnosis not present

## 2019-08-28 DIAGNOSIS — H353232 Exudative age-related macular degeneration, bilateral, with inactive choroidal neovascularization: Secondary | ICD-10-CM | POA: Diagnosis not present

## 2019-09-13 DIAGNOSIS — S80812A Abrasion, left lower leg, initial encounter: Secondary | ICD-10-CM | POA: Diagnosis not present

## 2019-09-13 DIAGNOSIS — S60512A Abrasion of left hand, initial encounter: Secondary | ICD-10-CM | POA: Diagnosis not present

## 2019-09-13 DIAGNOSIS — W010XXA Fall on same level from slipping, tripping and stumbling without subsequent striking against object, initial encounter: Secondary | ICD-10-CM | POA: Diagnosis not present

## 2019-09-15 DIAGNOSIS — W010XXD Fall on same level from slipping, tripping and stumbling without subsequent striking against object, subsequent encounter: Secondary | ICD-10-CM | POA: Diagnosis not present

## 2019-09-15 DIAGNOSIS — S80812D Abrasion, left lower leg, subsequent encounter: Secondary | ICD-10-CM | POA: Diagnosis not present

## 2019-09-15 DIAGNOSIS — S60512D Abrasion of left hand, subsequent encounter: Secondary | ICD-10-CM | POA: Diagnosis not present

## 2019-09-21 ENCOUNTER — Other Ambulatory Visit: Payer: Self-pay

## 2019-09-21 MED ORDER — FINASTERIDE 5 MG PO TABS: 5.0000 mg | ORAL_TABLET | Freq: Every day | ORAL | 11 refills | Status: DC

## 2019-09-21 NOTE — Telephone Encounter (Signed)
RX sent

## 2019-11-08 DIAGNOSIS — N1832 Chronic kidney disease, stage 3b: Secondary | ICD-10-CM | POA: Diagnosis not present

## 2019-11-08 DIAGNOSIS — I2510 Atherosclerotic heart disease of native coronary artery without angina pectoris: Secondary | ICD-10-CM | POA: Diagnosis not present

## 2019-11-08 DIAGNOSIS — Z720 Tobacco use: Secondary | ICD-10-CM | POA: Diagnosis not present

## 2019-11-08 DIAGNOSIS — I255 Ischemic cardiomyopathy: Secondary | ICD-10-CM | POA: Diagnosis not present

## 2019-11-08 DIAGNOSIS — I5022 Chronic systolic (congestive) heart failure: Secondary | ICD-10-CM | POA: Diagnosis not present

## 2019-11-08 DIAGNOSIS — I10 Essential (primary) hypertension: Secondary | ICD-10-CM | POA: Diagnosis not present

## 2019-11-15 DIAGNOSIS — Z95810 Presence of automatic (implantable) cardiac defibrillator: Secondary | ICD-10-CM | POA: Diagnosis not present

## 2019-11-15 DIAGNOSIS — I5022 Chronic systolic (congestive) heart failure: Secondary | ICD-10-CM | POA: Diagnosis not present

## 2019-11-15 DIAGNOSIS — E7800 Pure hypercholesterolemia, unspecified: Secondary | ICD-10-CM | POA: Diagnosis not present

## 2019-11-15 DIAGNOSIS — R7309 Other abnormal glucose: Secondary | ICD-10-CM | POA: Diagnosis not present

## 2019-11-15 DIAGNOSIS — Z720 Tobacco use: Secondary | ICD-10-CM | POA: Diagnosis not present

## 2019-11-15 DIAGNOSIS — N1832 Chronic kidney disease, stage 3b: Secondary | ICD-10-CM | POA: Diagnosis not present

## 2019-11-15 DIAGNOSIS — I10 Essential (primary) hypertension: Secondary | ICD-10-CM | POA: Diagnosis not present

## 2019-11-16 ENCOUNTER — Other Ambulatory Visit: Payer: Self-pay

## 2019-11-16 ENCOUNTER — Telehealth: Payer: Self-pay

## 2019-11-16 ENCOUNTER — Ambulatory Visit: Payer: PPO | Admitting: Urology

## 2019-11-16 ENCOUNTER — Encounter: Payer: Self-pay | Admitting: Urology

## 2019-11-16 VITALS — BP 165/69 | HR 74 | Ht 71.0 in | Wt 215.0 lb

## 2019-11-16 DIAGNOSIS — N3001 Acute cystitis with hematuria: Secondary | ICD-10-CM | POA: Diagnosis not present

## 2019-11-16 DIAGNOSIS — R310 Gross hematuria: Secondary | ICD-10-CM

## 2019-11-16 DIAGNOSIS — R300 Dysuria: Secondary | ICD-10-CM

## 2019-11-16 LAB — BLADDER SCAN AMB NON-IMAGING

## 2019-11-16 MED ORDER — SULFAMETHOXAZOLE-TRIMETHOPRIM 800-160 MG PO TABS: 1.0000 | ORAL_TABLET | Freq: Two times a day (BID) | ORAL | 0 refills | Status: DC

## 2019-11-16 NOTE — Progress Notes (Addendum)
11/16/2019 4:56 PM   Peter [name] 1941/01/26 [id number]  Referring provider: Tracie Harrier, MD [address] [facility] [place],  [place] [address]  Chief Complaint  Patient presents with  . Dysuria    [name] is a 79 y.o. male with a history of BPH w/ LUTS, urinary frequency and microscopic hematuria who presents today for evaluation of hematuria and dysuria.   BPH with LUTS He saw Dr. Diamantina [name] on 02/20/2019 for follow-up of BPH with LUTS including weak stream, hesitancy, and post void dribbling, on Flomax and finasteride.  He last saw Peter Loop, PA-C on 03/21/2019. He reported urinary urgency, frequency, and an unusual bladder sensation x5 days.  He stated his symptoms began following IV contrast administration for a CTA chest abdomen and pelvis.  He denied gross hematuria, fever, chills, and a history of nephrolithiasis. In-office UA positive for 1+ blood and 2+ protein; urine microscopy with 3-10 RBCs/HPF. PVR 17mL. Urine culture was positive for Staphylococcus lugdunensis. He was treated with 5 days of culture appropriate antibiotic. His follow up UA was negative for micro heme.    Reports this morning around 1 AM he had mild hematuria. Later on this morning he had increased hematuria. He has burning with urination. Denies fevers, chills, nausea, vomiting, and diarrhea.  He has adequate emptying. He has no drank any fluids today. PVR is 34 mL. He is concerned symptoms could be related to drinking caffeinated beverages.     Patient has an approximate 63-pack-year smoking history.   PMH: Past Medical History:  Diagnosis Date  . Anginal pain ([place])   . Asthma   . Bladder infection, acute 03/2011   "had a whole lot of bleeding from this"  . CHF (congestive heart failure) ([place])   . Coronary artery disease   . High cholesterol   . Hypertension   . 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 Myocardial infarction ([place]) 1996  . NSTEMI (non-ST elevated myocardial infarction) ([place]) 12/14/2011  . Shortness of breath 12/14/2011   "@ rest, lying down, w/exertion"    Surgical History: Past Surgical History:  Procedure Laterality Date  . [place]; ~ 2003; ~ 2008   "total of 3"  . CORONARY STENT INTERVENTION N/A 08/10/2016   Procedure: Coronary Stent Intervention;  Surgeon: [name], MD;  Location: [facility];  Service: Cardiovascular;  Laterality: N/A;  . HERNIA REPAIR  ~ 2001   "abdominal w/mesh implanted"  . LEFT HEART CATH AND CORONARY ANGIOGRAPHY N/A 08/10/2016   Procedure: Left Heart Cath and Coronary Angiography;  Surgeon: [name], MD;  Location: [place];  Service: Cardiovascular;  Laterality: N/A;  . LEFT HEART CATHETERIZATION WITH CORONARY ANGIOGRAM N/A 12/16/2011   Procedure: LEFT HEART CATHETERIZATION WITH CORONARY ANGIOGRAM;  Surgeon: Minus Breeding, MD;  [facility] C[facility] LAB;  Service: Cardiovascular;  Laterality: N/A;  . PERCUTANEOUS CORONARY STENT INTERVENTION (PCI-S) N/A 12/17/2011   Procedure: PERCUTANEOUS CORONARY STENT INTERVENTION (PCI-S);  Surgeon: Sherren Mocha, MD;  Location: [facility] CATH LAB;  Service: Cardiovascular;  Laterality: N/A;  . TONSILLECTOMY     "I was a kid"    Home Medications:  Allergies as of 11/16/2019      Reactions   Iodinated Diagnostic Agents Shortness Of Breath   Iodinated Glycerol  [glycerol, Iodinated] Shortness Of Breath      Medication List  Accurate as of November 16, 2019  4:56 PM. If you have any questions, ask your nurse or doctor.        amLODipine 10 MG tablet Commonly known as: NORVASC Take 10 mg by mouth daily.   amLODipine-benazepril 10-40 MG capsule Commonly known as: LOTREL TAKE 1 CAPSULE BY MOUTH EVERY DAY   aspirin 325 MG tablet Take 325 mg by mouth 2 (two) times daily. Patient takes if there is any pain or burning in the  chest.   atorvastatin 40 MG tablet Commonly known as: LIPITOR Take 1 tablet (40 mg total) by mouth daily at 6 PM.   carvedilol 3.125 MG tablet Commonly known as: COREG Take 3.125 mg by mouth 2 (two) times daily with a meal.   clobetasol cream 0.05 % Commonly known as: TEMOVATE APPLY TO PSORIASIS BID PRN UNTIL SMOOTH   clopidogrel 75 MG tablet Commonly known as: PLAVIX TAKE 1 TABLET(75 MG) BY MOUTH EVERY DAY   cyanocobalamin 1000 MCG tablet Take 1,000 mcg by mouth daily.   finasteride 5 MG tablet Commonly known as: PROSCAR Take 1 tablet (5 mg total) by mouth daily.   furosemide 20 MG tablet Commonly known as: Lasix Take 1 tablet (20 mg total) by mouth daily. What changed: when to take this   GLUCOSAMINE-CHONDROITIN-VIT C PO Take 1 tablet by mouth daily.   hydrochlorothiazide 12.5 MG tablet Commonly known as: HYDRODIURIL Take 12.5 mg by mouth daily.   isosorbide mononitrate 30 MG 24 hr tablet Commonly known as: IMDUR Take 30 mg by mouth daily.   losartan 100 MG tablet Commonly known as: COZAAR Take 100 mg by mouth daily.   omeprazole 20 MG capsule Commonly known as: PRILOSEC Take by mouth.   spironolactone 25 MG tablet Commonly known as: ALDACTONE SMARTSIG:0.25 Tablet(s) By Mouth Daily   sulfamethoxazole-trimethoprim 800-160 MG tablet Commonly known as: BACTRIM DS Take 1 tablet by mouth every 12 (twelve) hours. Started by: Peter Council, PA-C   tamsulosin 0.4 MG Caps capsule Commonly known as: FLOMAX Take 1 capsule (0.4 mg total) by mouth daily.   Wal-Zyr 10 MG tablet Generic drug: cetirizine Take 10 mg by mouth.       Allergies:  Allergies  Allergen Reactions  . Iodinated Diagnostic Agents Shortness Of Breath  . Iodinated Glycerol  [Glycerol, Iodinated] Shortness Of Breath    Family History: Family History  Family history unknown: Yes    Social History:  reports that he has been smoking cigarettes. He has a 0.25 pack-year smoking  history. He has never used smokeless tobacco. He reports current alcohol use of about 1.0 standard drink of alcohol per week. He reports that he does not use drugs.  ROS: Pertinent ROS in HPI  Physical Exam: BP (!) 165/69 (BP Location: Left Arm, Patient Position: Sitting, Cuff Size: Normal)   Pulse 74   Ht 5\' 11"  (1.803 m)   Wt 215 lb (97.5 kg)   BMI 29.99 kg/m   Constitutional:  Well nourished. Alert and oriented, No acute distress. HEENT: [place] AT, mask in place.  Trachea midline Cardiovascular: No clubbing, cyanosis, or edema. Respiratory: Normal respiratory effort, no increased work of breathing. Neurologic: Grossly intact, no focal deficits, moving all 4 extremities. Psychiatric: Normal mood and affect.  Laboratory Data:  Urinalysis  Component     Latest Ref Rng & Units 11/16/2019  Specific Gravity, UA     1.005 - 1.030 1.020  pH, UA     5.0 - 7.5 5.5  Color, UA  Yellow Yellow  Appearance Ur     Clear Cloudy (A)  Leukocytes,UA     Negative 2+ (A)  Protein,UA     Negative/Trace 2+ (A)  Glucose, UA     Negative Negative  Ketones, UA     Negative Negative  RBC, UA     Negative 3+ (A)  Bilirubin, UA     Negative Negative  Urobilinogen, Ur     0.2 - 1.0 mg/dL 0.2  Nitrite, UA     Negative Positive (A)  Microscopic Examination      See below:   Component     Latest Ref Rng & Units 11/16/2019  WBC, UA     0 - 5 /hpf >30 (A)  RBC     0 - 2 /hpf >30 (A)  Epithelial Cells (non renal)     0 - 10 /hpf 0-10  Bacteria, UA     None seen/Few Many (A)    I have reviewed the labs.  Pertinent image  Results for orders placed or performed in visit on 11/16/19  Bladder Scan (Post Void Residual) in office  Result Value Ref Range   Scan Result 33mL      Assessment & Plan:    1. UTI  UA today is consistent with infection.  -Urine culture sent. -Bactrim 800-160 mg tablet BID Rx sent and may be adjusted according to culture results.   2. Gross hematuria  UA  shows nitrite positive, >30 WBC, >30 RBC, many bacteria  Follow up in 3 weeks to ensure microscopic hematuria has cleared with the treatment of UTI.   Return in about 3 weeks (around 12/07/2019) for follow up for UTI and micro heme .  These notes generated with voice recognition software. I apologize for typographical errors.  Fransico Him, am acting as a Education administrator for [employer],  I have reviewed the above documentation for accuracy and completeness, and I agree with the above.    Peter Council, PA-C   [facility] [address]  [place], [place] [address] [phone]

## 2019-11-16 NOTE — Telephone Encounter (Signed)
Patient called stating he has been passing blood in his urine and is having some burning with urination as well. He was added on to PA schedule today for further evaluation

## 2019-11-17 LAB — MICROSCOPIC EXAMINATION
RBC, Urine: >30 /hpf — AB (ref 0–2)
WBC, UA: >30 /hpf — AB (ref 0–5)

## 2019-11-17 LAB — URINALYSIS, COMPLETE
Bilirubin, UA: NEGATIVE
Glucose, UA: NEGATIVE
Ketones, UA: NEGATIVE
Nitrite, UA: POSITIVE — AB
Specific Gravity, UA: 1.02 (ref 1.005–1.030)
Urobilinogen, Ur: 0.2 mg/dL (ref 0.2–1.0)
pH, UA: 5.5 (ref 5.0–7.5)

## 2019-11-20 ENCOUNTER — Telehealth: Payer: Self-pay | Admitting: Family Medicine

## 2019-11-20 LAB — CULTURE, URINE COMPREHENSIVE

## 2019-11-20 NOTE — Telephone Encounter (Signed)
Patient notified and voiced understanding.

## 2019-11-20 NOTE — Telephone Encounter (Signed)
-----   Message from Nori Riis, PA-C sent at 11/20/2019  8:04 AM EDT ----- Please let Mr. Tallman know that his urine culture was positive for infection.  The antibiotic, Septra DS (sulfamethoxazole/trimethoprim), is the appropriate antibiotic.  It is important that we recheck the urine to ensure the microscopic blood has cleared as scheduled.

## 2019-12-07 ENCOUNTER — Other Ambulatory Visit: Payer: Self-pay

## 2019-12-07 ENCOUNTER — Ambulatory Visit (INDEPENDENT_AMBULATORY_CARE_PROVIDER_SITE_OTHER): Payer: PPO | Admitting: Physician Assistant

## 2019-12-07 ENCOUNTER — Encounter: Payer: Self-pay | Admitting: Physician Assistant

## 2019-12-07 VITALS — BP 146/73 | HR 67 | Ht 71.0 in | Wt 216.4 lb

## 2019-12-07 DIAGNOSIS — R3129 Other microscopic hematuria: Secondary | ICD-10-CM | POA: Diagnosis not present

## 2019-12-07 DIAGNOSIS — R350 Frequency of micturition: Secondary | ICD-10-CM | POA: Diagnosis not present

## 2019-12-07 NOTE — Progress Notes (Signed)
12/07/2019 3:24 PM   [name] Jul 22, 1940 [id number]  CC: Chief Complaint  Patient presents with  . Urinary Frequency    HPI: Peter [name] is a 79 y.o. male with PMH BPH with LUTS on Flomax and finasteride, urinary frequency, and microscopic hematuria who presents today for follow-up UA. He was seen most recently by [facility] on 11/16/2019 for evaluation of gross hematuria with a grossly infected UA. He was treated with Bactrim DS twice daily x7 days; urine culture ultimately grew pansensitive E. coli.  Today he reports feeling well and denies dysuria, urgency, and frequency.  He has no acute concerns today.  He completed antibiotics as prescribed.  In-office UA today positive for trace-intact blood; urine microscopy pan negative.  PMH: Past Medical History:  Diagnosis Date  . Anginal pain ([place])   . Asthma   . Bladder infection, acute 03/2011   "had a whole lot of bleeding from this"  . CHF (congestive heart failure) ([place])   . Coronary artery disease   . High cholesterol   . Hypertension   . 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 Myocardial infarction ([place]) 1996  . NSTEMI (non-ST elevated myocardial infarction) ([place]) 12/14/2011  . Shortness of breath 12/14/2011   "@ rest, lying down, w/exertion"    Surgical History: Past Surgical History:  Procedure Laterality Date  . [place]; ~ 2003; ~ 2008   "total of 3"  . CORONARY STENT INTERVENTION N/A 08/10/2016   Procedure: Coronary Stent Intervention;  Surgeon: [name], MD;  Location: [facility];  Service: Cardiovascular;  Laterality: N/A;  . HERNIA REPAIR  ~ 2001   "abdominal w/mesh implanted"  . LEFT HEART CATH AND CORONARY ANGIOGRAPHY N/A 08/10/2016   Procedure: Left Heart Cath and Coronary Angiography;  Surgeon: [name], MD;  Location: [facility];  Service: Cardiovascular;   Laterality: N/A;  . LEFT HEART CATHETERIZATION WITH CORONARY ANGIOGRAM N/A 12/16/2011   Procedure: LEFT HEART CATHETERIZATION WITH CORONARY ANGIOGRAM;  Surgeon: Minus Breeding, MD;  Location[facility];  Service: Cardiovascular;  Laterality: N/A;  . PERCUTANEOUS CORONARY STENT INTERVENTION (PCI-S) N/A 12/17/2011   Procedure: PERCUTANEOUS CORONARY STENT INTERVENTION (PCI-S);  Surgeon: Sherren Mocha, MD;  Location: [facility] CATH LAB;  Service: Cardiovascular;  Laterality: N/A;  . TONSILLECTOMY     "I was a kid"    Home Medications:  Allergies as of 12/07/2019      Reactions   Iodinated Diagnostic Agents Shortness Of Breath   Iodinated Glycerol  [glycerol, Iodinated] Shortness Of Breath      Medication List       Accurate as of December 07, 2019 11:59 PM. If you have any questions, ask your nurse or doctor.        amLODipine 10 MG tablet Commonly known as: NORVASC Take 10 mg by mouth daily.   amLODipine-benazepril 10-40 MG capsule Commonly known as: LOTREL TAKE 1 CAPSULE BY MOUTH EVERY DAY   aspirin 325 MG tablet Take 325 mg by mouth 2 (two) times daily. Patient takes if there is any pain or burning in the chest.   atorvastatin 40 MG tablet Commonly known as: LIPITOR Take 1 tablet (40 mg total) by mouth daily at 6 PM.   carvedilol 3.125 MG tablet Commonly known as: COREG Take 3.125 mg by mouth 2 (two) times daily with a meal.   clobetasol cream 0.05 %  Commonly known as: TEMOVATE APPLY TO PSORIASIS BID PRN UNTIL SMOOTH   clopidogrel 75 MG tablet Commonly known as: PLAVIX TAKE 1 TABLET(75 MG) BY MOUTH EVERY DAY   cyanocobalamin 1000 MCG tablet Take 1,000 mcg by mouth daily.   finasteride 5 MG tablet Commonly known as: PROSCAR Take 1 tablet (5 mg total) by mouth daily.   furosemide 20 MG tablet Commonly known as: Lasix Take 1 tablet (20 mg total) by mouth daily. What changed: when to take this   GLUCOSAMINE-CHONDROITIN-VIT C PO Take 1 tablet by mouth daily.     hydrochlorothiazide 12.5 MG tablet Commonly known as: HYDRODIURIL Take 12.5 mg by mouth daily.   isosorbide mononitrate 30 MG 24 hr tablet Commonly known as: IMDUR Take 30 mg by mouth daily.   losartan 100 MG tablet Commonly known as: COZAAR Take 100 mg by mouth daily.   omeprazole 20 MG capsule Commonly known as: PRILOSEC Take by mouth.   spironolactone 25 MG tablet Commonly known as: ALDACTONE SMARTSIG:0.25 Tablet(s) By Mouth Daily   sulfamethoxazole-trimethoprim 800-160 MG tablet Commonly known as: BACTRIM DS Take 1 tablet by mouth every 12 (twelve) hours.   tamsulosin 0.4 MG Caps capsule Commonly known as: FLOMAX Take 1 capsule (0.4 mg total) by mouth daily.   Wal-Zyr 10 MG tablet Generic drug: cetirizine Take 10 mg by mouth.       Allergies:  Allergies  Allergen Reactions  . Iodinated Diagnostic Agents Shortness Of Breath  . Iodinated Glycerol  [Glycerol, Iodinated] Shortness Of Breath    Family History: Family History  Family history unknown: Yes    Social History:   reports that he has been smoking cigarettes. He has a 0.25 pack-year smoking history. He has never used smokeless tobacco. He reports current alcohol use of about 1.0 standard drink of alcohol per week. He reports that he does not use drugs.  Physical Exam: BP (!) 146/73 (BP Location: Left Arm, Patient Position: Sitting, Cuff Size: Normal)   Pulse 67   Ht 5\' 11"  (1.803 m)   Wt 216 lb 6.4 oz (98.2 kg)   BMI 30.18 kg/m   Constitutional:  Alert and oriented, no acute distress, nontoxic appearing HEENT: [place], AT Cardiovascular: No clubbing, cyanosis, or edema Respiratory: Normal respiratory effort, no increased work of breathing Skin: No rashes, bruises or suspicious lesions Neurologic: Grossly intact, no focal deficits, moving all 4 extremities Psychiatric: Normal mood and affect  Laboratory Data: Results for orders placed or performed in visit on 12/07/19  Microscopic Examination    Urine  Result Value Ref Range   WBC, UA 0-5 0 - 5 /hpf   RBC 0-2 0 - 2 /hpf   Epithelial Cells (non renal) None seen 0 - 10 /hpf   Bacteria, UA None seen None seen/Few  Urinalysis, Complete  Result Value Ref Range   Specific Gravity, UA 1.010 1.005 - 1.030   pH, UA 5.5 5.0 - 7.5   Color, UA Yellow Yellow   Appearance Ur Clear Clear   Leukocytes,UA Negative Negative   Protein,UA Negative Negative/Trace   Glucose, UA Negative Negative   Ketones, UA Negative Negative   RBC, UA Trace (A) Negative   Bilirubin, UA Negative Negative   Urobilinogen, Ur 0.2 0.2 - 1.0 mg/dL   Nitrite, UA Negative Negative   Microscopic Examination See below:    Assessment & Plan:   1. Microscopic hematuria Resolved on UA today following completion of culture appropriate antibiotics for acute cystitis.  No further intervention indicated.  Patient to follow-up as needed.  He expressed understanding. - Urinalysis, Complete   Return if symptoms worsen or fail to improve.  Debroah Loop, PA-C  [facility] [address] [place], [place] [address] [phone]

## 2019-12-08 ENCOUNTER — Ambulatory Visit: Payer: Self-pay | Admitting: Physician Assistant

## 2019-12-08 LAB — URINALYSIS, COMPLETE
Bilirubin, UA: NEGATIVE
Glucose, UA: NEGATIVE
Ketones, UA: NEGATIVE
Leukocytes,UA: NEGATIVE
Nitrite, UA: NEGATIVE
Protein,UA: NEGATIVE
Specific Gravity, UA: 1.01 (ref 1.005–1.030)
Urobilinogen, Ur: 0.2 mg/dL (ref 0.2–1.0)
pH, UA: 5.5 (ref 5.0–7.5)

## 2019-12-08 LAB — MICROSCOPIC EXAMINATION
Bacteria, UA: NONE SEEN
Epithelial Cells (non renal): NONE SEEN /hpf (ref 0–10)

## 2019-12-20 DIAGNOSIS — H6123 Impacted cerumen, bilateral: Secondary | ICD-10-CM | POA: Diagnosis not present

## 2019-12-28 DIAGNOSIS — Z955 Presence of coronary angioplasty implant and graft: Secondary | ICD-10-CM | POA: Diagnosis not present

## 2019-12-28 DIAGNOSIS — G4733 Obstructive sleep apnea (adult) (pediatric): Secondary | ICD-10-CM | POA: Diagnosis not present

## 2019-12-28 DIAGNOSIS — Z9889 Other specified postprocedural states: Secondary | ICD-10-CM | POA: Diagnosis not present

## 2019-12-28 DIAGNOSIS — E785 Hyperlipidemia, unspecified: Secondary | ICD-10-CM | POA: Diagnosis not present

## 2019-12-28 DIAGNOSIS — I255 Ischemic cardiomyopathy: Secondary | ICD-10-CM | POA: Diagnosis not present

## 2019-12-28 DIAGNOSIS — Z95810 Presence of automatic (implantable) cardiac defibrillator: Secondary | ICD-10-CM | POA: Diagnosis not present

## 2019-12-28 DIAGNOSIS — F17200 Nicotine dependence, unspecified, uncomplicated: Secondary | ICD-10-CM | POA: Diagnosis not present

## 2019-12-28 DIAGNOSIS — I5022 Chronic systolic (congestive) heart failure: Secondary | ICD-10-CM | POA: Diagnosis not present

## 2019-12-28 DIAGNOSIS — R0600 Dyspnea, unspecified: Secondary | ICD-10-CM | POA: Diagnosis not present

## 2019-12-28 DIAGNOSIS — J449 Chronic obstructive pulmonary disease, unspecified: Secondary | ICD-10-CM | POA: Diagnosis not present

## 2019-12-28 DIAGNOSIS — Z8679 Personal history of other diseases of the circulatory system: Secondary | ICD-10-CM | POA: Diagnosis not present

## 2020-01-29 DIAGNOSIS — D2261 Melanocytic nevi of right upper limb, including shoulder: Secondary | ICD-10-CM | POA: Diagnosis not present

## 2020-01-29 DIAGNOSIS — Z85828 Personal history of other malignant neoplasm of skin: Secondary | ICD-10-CM | POA: Diagnosis not present

## 2020-01-29 DIAGNOSIS — Z85820 Personal history of malignant melanoma of skin: Secondary | ICD-10-CM | POA: Diagnosis not present

## 2020-01-29 DIAGNOSIS — D225 Melanocytic nevi of trunk: Secondary | ICD-10-CM | POA: Diagnosis not present

## 2020-01-29 DIAGNOSIS — L570 Actinic keratosis: Secondary | ICD-10-CM | POA: Diagnosis not present

## 2020-01-29 DIAGNOSIS — L89321 Pressure ulcer of left buttock, stage 1: Secondary | ICD-10-CM | POA: Diagnosis not present

## 2020-01-29 DIAGNOSIS — X32XXXA Exposure to sunlight, initial encounter: Secondary | ICD-10-CM | POA: Diagnosis not present

## 2020-01-29 DIAGNOSIS — D2272 Melanocytic nevi of left lower limb, including hip: Secondary | ICD-10-CM | POA: Diagnosis not present

## 2020-01-29 DIAGNOSIS — L821 Other seborrheic keratosis: Secondary | ICD-10-CM | POA: Diagnosis not present

## 2020-02-22 ENCOUNTER — Encounter: Payer: Self-pay | Admitting: Urology

## 2020-02-22 ENCOUNTER — Other Ambulatory Visit: Payer: Self-pay

## 2020-02-22 ENCOUNTER — Ambulatory Visit: Payer: PPO | Admitting: Urology

## 2020-02-22 VITALS — BP 134/63 | HR 76 | Ht 71.0 in | Wt 222.8 lb

## 2020-02-22 DIAGNOSIS — N390 Urinary tract infection, site not specified: Secondary | ICD-10-CM

## 2020-02-22 DIAGNOSIS — R3129 Other microscopic hematuria: Secondary | ICD-10-CM

## 2020-02-22 DIAGNOSIS — N401 Enlarged prostate with lower urinary tract symptoms: Secondary | ICD-10-CM | POA: Diagnosis not present

## 2020-02-22 MED ORDER — FINASTERIDE 5 MG PO TABS: 5.0000 mg | ORAL_TABLET | Freq: Every day | ORAL | 3 refills | Status: DC

## 2020-02-22 MED ORDER — TAMSULOSIN HCL 0.4 MG PO CAPS: 0.4000 mg | ORAL_CAPSULE | Freq: Every day | ORAL | 3 refills | Status: DC

## 2020-02-22 NOTE — Progress Notes (Signed)
   02/22/2020 5:25 PM   [name] July 27, 1940 [id number]  Reason for visit: Follow up BPH and urinary symptoms, UTIs  HPI: I saw Peter [name] back in urology clinic for BPH and urinary symptoms.  He was previously followed by Dr. Pilar Jarvis.  He is a 79 year old comorbid male with stents of cardiac history, multiple vascular stents, pacemaker, anticoagulated with aspirin and Plavix.  He has a large 123 g prostate on prior CT and has chronic urinary symptoms of weak stream and difficulty starting stream.  He is on maximal medical therapy with Flomax and finasteride.    He overall reports that his symptoms are stable.  IPSS score today is 14, with quality of life mostly satisfied, and PVR is normal at 28 mL.  He really denies any significant urinary complaints today and feels like he is happy with his urinary symptoms at the time being.  He did have a culture documented E. coli UTI in August 2021 that was treated with culture appropriate antibiotics.  I recommended cranberry tablets for UTI prevention.  We discussed that if he develops recurrent UTIs or worsening urinary symptoms, we may need to consider outlet procedures, however he would be high risk with his number of medical problems and very large prostate.  He is very averse to surgery unless absolutely necessary, which I think is very reasonable.  Flomax and finasteride refilled Cranberry tablets for UTI prophylaxis RTC 1 year symptom check  Billey Co, MD  [facility] [address], [place], [place] [address] [phone]

## 2020-02-22 NOTE — Patient Instructions (Signed)
Take cranberry tablet supplement to prevent UTI.   Benign Prostatic Hyperplasia  Benign prostatic hyperplasia (BPH) is an enlarged prostate gland that is caused by the normal aging process and not by cancer. The prostate is a walnut-sized gland that is involved in the production of semen. It is located in front of the rectum and below the bladder. The bladder stores urine and the urethra is the tube that carries the urine out of the body. The prostate may get bigger as a man gets older. An enlarged prostate can press on the urethra. This can make it harder to pass urine. The build-up of urine in the bladder can cause infection. Back pressure and infection may progress to bladder damage and kidney (renal) failure. What are the causes? This condition is part of a normal aging process. However, not all men develop problems from this condition. If the prostate enlarges away from the urethra, urine flow will not be blocked. If it enlarges toward the urethra and compresses it, there will be problems passing urine. What increases the risk? This condition is more likely to develop in men over the age of 45 years. What are the signs or symptoms? Symptoms of this condition include:  Getting up often during the night to urinate.  Needing to urinate frequently during the day.  Difficulty starting urine flow.  Decrease in size and strength of your urine stream.  Leaking (dribbling) after urinating.  Inability to pass urine. This needs immediate treatment.  Inability to completely empty your bladder.  Pain when you pass urine. This is more common if there is also an infection.  Urinary tract infection (UTI). How is this diagnosed? This condition is diagnosed based on your medical history, a physical exam, and your symptoms. Tests will also be done, such as:  A post-void bladder scan. This measures any amount of urine that may remain in your bladder after you finish urinating.  A digital rectal  exam. In a rectal exam, your health care provider checks your prostate by putting a lubricated, gloved finger into your rectum to feel the back of your prostate gland. This exam detects the size of your gland and any abnormal lumps or growths.  An exam of your urine (urinalysis).  A prostate specific antigen (PSA) screening. This is a blood test used to screen for prostate cancer.  An ultrasound. This test uses sound waves to electronically produce a picture of your prostate gland. Your health care provider may refer you to a specialist in kidney and prostate diseases (urologist). How is this treated? Once symptoms begin, your health care provider will monitor your condition (active surveillance or watchful waiting). Treatment for this condition will depend on the severity of your condition. Treatment may include:  Observation and yearly exams. This may be the only treatment needed if your condition and symptoms are mild.  Medicines to relieve your symptoms, including: ? Medicines to shrink the prostate. ? Medicines to relax the muscle of the prostate.  Surgery in severe cases. Surgery may include: ? Prostatectomy. In this procedure, the prostate tissue is removed completely through an open incision or with a laparoscope or robotics. ? Transurethral resection of the prostate (TURP). In this procedure, a tool is inserted through the opening at the tip of the penis (urethra). It is used to cut away tissue of the inner core of the prostate. The pieces are removed through the same opening of the penis. This removes the blockage. ? Transurethral incision (TUIP). In this procedure,  small cuts are made in the prostate. This lessens the prostate's pressure on the urethra. ? Transurethral microwave thermotherapy (TUMT). This procedure uses microwaves to create heat. The heat destroys and removes a small amount of prostate tissue. ? Transurethral needle ablation (TUNA). This procedure uses radio  frequencies to destroy and remove a small amount of prostate tissue. ? Interstitial laser coagulation ([place]). This procedure uses a laser to destroy and remove a small amount of prostate tissue. ? Transurethral electrovaporization (TUVP). This procedure uses electrodes to destroy and remove a small amount of prostate tissue. ? Prostatic urethral lift. This procedure inserts an implant to push the lobes of the prostate away from the urethra. Follow these instructions at home:  Take over-the-counter and prescription medicines only as told by your health care provider.  Monitor your symptoms for any changes. Contact your health care provider with any changes.  Avoid drinking large amounts of liquid before going to bed or out in public.  Avoid or reduce how much caffeine or alcohol you drink.  Give yourself time when you urinate.  Keep all follow-up visits as told by your health care provider. This is important. Contact a health care provider if:  You have unexplained back pain.  Your symptoms do not get better with treatment.  You develop side effects from the medicine you are taking.  Your urine becomes very dark or has a bad smell.  Your lower abdomen becomes distended and you have trouble passing your urine. Get help right away if:  You have a fever or chills.  You suddenly cannot urinate.  You feel lightheaded, or very dizzy, or you faint.  There are large amounts of blood or clots in the urine.  Your urinary problems become hard to manage.  You develop moderate to severe low back or flank pain. The flank is the side of your body between the ribs and the hip. These symptoms may represent a serious problem that is an emergency. Do not wait to see if the symptoms will go away. Get medical help right away. Call your local emergency services (911 in the [place] Do not drive yourself to the hospital. Summary  Benign prostatic hyperplasia (BPH) is an enlarged prostate that is  caused by the normal aging process and not by cancer.  An enlarged prostate can press on the urethra. This can make it hard to pass urine.  This condition is part of a normal aging process and is more likely to develop in men over the age of 17 years.  Get help right away if you suddenly cannot urinate. This information is not intended to replace advice given to you by your health care provider. Make sure you discuss any questions you have with your health care provider. Document Revised: 02/22/2018 Document Reviewed: 05/04/2016 Elsevier Patient Education  2020 [employer].

## 2020-03-04 ENCOUNTER — Ambulatory Visit
Admission: RE | Admit: 2020-03-04 | Discharge: 2020-03-04 | Disposition: A | Discharge status: Home / Self Care | Payer: PPO | Source: Ambulatory Visit | Attending: Physician Assistant | Admitting: Physician Assistant

## 2020-03-04 ENCOUNTER — Other Ambulatory Visit (HOSPITAL_COMMUNITY): Payer: Self-pay | Admitting: Physician Assistant

## 2020-03-04 ENCOUNTER — Other Ambulatory Visit: Payer: Self-pay

## 2020-03-04 ENCOUNTER — Other Ambulatory Visit: Payer: Self-pay | Admitting: Physician Assistant

## 2020-03-04 DIAGNOSIS — I10 Essential (primary) hypertension: Secondary | ICD-10-CM | POA: Diagnosis not present

## 2020-03-04 DIAGNOSIS — R600 Localized edema: Secondary | ICD-10-CM

## 2020-03-04 DIAGNOSIS — N1832 Chronic kidney disease, stage 3b: Secondary | ICD-10-CM | POA: Diagnosis not present

## 2020-03-04 DIAGNOSIS — I5022 Chronic systolic (congestive) heart failure: Secondary | ICD-10-CM | POA: Diagnosis not present

## 2020-03-20 DIAGNOSIS — Z79899 Other long term (current) drug therapy: Secondary | ICD-10-CM | POA: Diagnosis not present

## 2020-03-20 DIAGNOSIS — E7800 Pure hypercholesterolemia, unspecified: Secondary | ICD-10-CM | POA: Diagnosis not present

## 2020-03-20 DIAGNOSIS — I5022 Chronic systolic (congestive) heart failure: Secondary | ICD-10-CM | POA: Diagnosis not present

## 2020-03-20 DIAGNOSIS — R7309 Other abnormal glucose: Secondary | ICD-10-CM | POA: Diagnosis not present

## 2020-03-20 DIAGNOSIS — N1832 Chronic kidney disease, stage 3b: Secondary | ICD-10-CM | POA: Diagnosis not present

## 2020-03-20 DIAGNOSIS — I10 Essential (primary) hypertension: Secondary | ICD-10-CM | POA: Diagnosis not present

## 2020-03-20 DIAGNOSIS — D649 Anemia, unspecified: Secondary | ICD-10-CM | POA: Diagnosis not present

## 2020-03-20 DIAGNOSIS — Z95810 Presence of automatic (implantable) cardiac defibrillator: Secondary | ICD-10-CM | POA: Diagnosis not present

## 2020-03-20 DIAGNOSIS — Z720 Tobacco use: Secondary | ICD-10-CM | POA: Diagnosis not present

## 2020-03-27 DIAGNOSIS — R7309 Other abnormal glucose: Secondary | ICD-10-CM | POA: Diagnosis not present

## 2020-03-27 DIAGNOSIS — L03115 Cellulitis of right lower limb: Secondary | ICD-10-CM | POA: Diagnosis not present

## 2020-03-27 DIAGNOSIS — I5022 Chronic systolic (congestive) heart failure: Secondary | ICD-10-CM | POA: Diagnosis not present

## 2020-03-27 DIAGNOSIS — I2510 Atherosclerotic heart disease of native coronary artery without angina pectoris: Secondary | ICD-10-CM | POA: Diagnosis not present

## 2020-03-27 DIAGNOSIS — F17200 Nicotine dependence, unspecified, uncomplicated: Secondary | ICD-10-CM | POA: Diagnosis not present

## 2020-03-27 DIAGNOSIS — I10 Essential (primary) hypertension: Secondary | ICD-10-CM | POA: Diagnosis not present

## 2020-03-27 DIAGNOSIS — I255 Ischemic cardiomyopathy: Secondary | ICD-10-CM | POA: Diagnosis not present

## 2020-03-27 DIAGNOSIS — M7989 Other specified soft tissue disorders: Secondary | ICD-10-CM | POA: Diagnosis not present

## 2020-03-27 DIAGNOSIS — Z0000 Encounter for general adult medical examination without abnormal findings: Secondary | ICD-10-CM | POA: Diagnosis not present

## 2020-04-01 DIAGNOSIS — H353232 Exudative age-related macular degeneration, bilateral, with inactive choroidal neovascularization: Secondary | ICD-10-CM | POA: Diagnosis not present

## 2020-06-25 ENCOUNTER — Other Ambulatory Visit: Payer: Self-pay | Admitting: Internal Medicine

## 2020-06-25 DIAGNOSIS — E669 Obesity, unspecified: Secondary | ICD-10-CM | POA: Diagnosis not present

## 2020-06-25 DIAGNOSIS — Z23 Encounter for immunization: Secondary | ICD-10-CM | POA: Diagnosis not present

## 2020-06-25 DIAGNOSIS — I824Y9 Acute embolism and thrombosis of unspecified deep veins of unspecified proximal lower extremity: Secondary | ICD-10-CM

## 2020-06-25 DIAGNOSIS — I255 Ischemic cardiomyopathy: Secondary | ICD-10-CM | POA: Diagnosis not present

## 2020-06-25 DIAGNOSIS — F17200 Nicotine dependence, unspecified, uncomplicated: Secondary | ICD-10-CM | POA: Diagnosis not present

## 2020-06-25 DIAGNOSIS — Z9889 Other specified postprocedural states: Secondary | ICD-10-CM | POA: Diagnosis not present

## 2020-06-25 DIAGNOSIS — Z955 Presence of coronary angioplasty implant and graft: Secondary | ICD-10-CM | POA: Diagnosis not present

## 2020-06-25 DIAGNOSIS — I5022 Chronic systolic (congestive) heart failure: Secondary | ICD-10-CM | POA: Diagnosis not present

## 2020-06-25 DIAGNOSIS — Z95810 Presence of automatic (implantable) cardiac defibrillator: Secondary | ICD-10-CM | POA: Diagnosis not present

## 2020-06-25 DIAGNOSIS — J449 Chronic obstructive pulmonary disease, unspecified: Secondary | ICD-10-CM | POA: Diagnosis not present

## 2020-06-25 DIAGNOSIS — E785 Hyperlipidemia, unspecified: Secondary | ICD-10-CM | POA: Diagnosis not present

## 2020-06-25 DIAGNOSIS — G4733 Obstructive sleep apnea (adult) (pediatric): Secondary | ICD-10-CM | POA: Diagnosis not present

## 2020-06-25 DIAGNOSIS — M7989 Other specified soft tissue disorders: Secondary | ICD-10-CM

## 2020-06-25 DIAGNOSIS — R0600 Dyspnea, unspecified: Secondary | ICD-10-CM | POA: Diagnosis not present

## 2020-06-26 ENCOUNTER — Other Ambulatory Visit: Payer: Self-pay

## 2020-06-26 ENCOUNTER — Ambulatory Visit
Admission: RE | Admit: 2020-06-26 | Discharge: 2020-06-26 | Disposition: A | Discharge status: Home / Self Care | Payer: PPO | Source: Ambulatory Visit | Attending: Internal Medicine | Admitting: Internal Medicine

## 2020-06-26 DIAGNOSIS — R600 Localized edema: Secondary | ICD-10-CM | POA: Diagnosis not present

## 2020-06-26 DIAGNOSIS — I824Y9 Acute embolism and thrombosis of unspecified deep veins of unspecified proximal lower extremity: Secondary | ICD-10-CM | POA: Diagnosis not present

## 2020-06-26 DIAGNOSIS — M7989 Other specified soft tissue disorders: Secondary | ICD-10-CM | POA: Insufficient documentation

## 2020-06-27 DIAGNOSIS — R2232 Localized swelling, mass and lump, left upper limb: Secondary | ICD-10-CM | POA: Diagnosis not present

## 2020-06-27 DIAGNOSIS — R600 Localized edema: Secondary | ICD-10-CM | POA: Diagnosis not present

## 2020-07-15 DIAGNOSIS — D2272 Melanocytic nevi of left lower limb, including hip: Secondary | ICD-10-CM | POA: Diagnosis not present

## 2020-07-15 DIAGNOSIS — R600 Localized edema: Secondary | ICD-10-CM | POA: Diagnosis not present

## 2020-07-15 DIAGNOSIS — D2261 Melanocytic nevi of right upper limb, including shoulder: Secondary | ICD-10-CM | POA: Diagnosis not present

## 2020-07-15 DIAGNOSIS — L308 Other specified dermatitis: Secondary | ICD-10-CM | POA: Diagnosis not present

## 2020-07-15 DIAGNOSIS — Z85820 Personal history of malignant melanoma of skin: Secondary | ICD-10-CM | POA: Diagnosis not present

## 2020-07-15 DIAGNOSIS — Z85828 Personal history of other malignant neoplasm of skin: Secondary | ICD-10-CM | POA: Diagnosis not present

## 2020-07-15 DIAGNOSIS — R202 Paresthesia of skin: Secondary | ICD-10-CM | POA: Diagnosis not present

## 2020-07-15 DIAGNOSIS — I872 Venous insufficiency (chronic) (peripheral): Secondary | ICD-10-CM | POA: Diagnosis not present

## 2020-07-19 DIAGNOSIS — M7989 Other specified soft tissue disorders: Secondary | ICD-10-CM | POA: Diagnosis not present

## 2020-07-19 DIAGNOSIS — F17200 Nicotine dependence, unspecified, uncomplicated: Secondary | ICD-10-CM | POA: Diagnosis not present

## 2020-07-19 DIAGNOSIS — I10 Essential (primary) hypertension: Secondary | ICD-10-CM | POA: Diagnosis not present

## 2020-07-19 DIAGNOSIS — I2510 Atherosclerotic heart disease of native coronary artery without angina pectoris: Secondary | ICD-10-CM | POA: Diagnosis not present

## 2020-07-19 DIAGNOSIS — R7309 Other abnormal glucose: Secondary | ICD-10-CM | POA: Diagnosis not present

## 2020-07-19 DIAGNOSIS — I5022 Chronic systolic (congestive) heart failure: Secondary | ICD-10-CM | POA: Diagnosis not present

## 2020-07-19 DIAGNOSIS — L03115 Cellulitis of right lower limb: Secondary | ICD-10-CM | POA: Diagnosis not present

## 2020-07-19 DIAGNOSIS — Z0000 Encounter for general adult medical examination without abnormal findings: Secondary | ICD-10-CM | POA: Diagnosis not present

## 2020-07-19 DIAGNOSIS — I255 Ischemic cardiomyopathy: Secondary | ICD-10-CM | POA: Diagnosis not present

## 2020-07-25 DIAGNOSIS — I739 Peripheral vascular disease, unspecified: Secondary | ICD-10-CM | POA: Diagnosis not present

## 2020-07-25 DIAGNOSIS — F17210 Nicotine dependence, cigarettes, uncomplicated: Secondary | ICD-10-CM | POA: Diagnosis not present

## 2020-07-25 DIAGNOSIS — Z0000 Encounter for general adult medical examination without abnormal findings: Secondary | ICD-10-CM | POA: Diagnosis not present

## 2020-07-25 DIAGNOSIS — Z720 Tobacco use: Secondary | ICD-10-CM | POA: Diagnosis not present

## 2020-07-25 DIAGNOSIS — I10 Essential (primary) hypertension: Secondary | ICD-10-CM | POA: Diagnosis not present

## 2020-07-25 DIAGNOSIS — Z85828 Personal history of other malignant neoplasm of skin: Secondary | ICD-10-CM | POA: Diagnosis not present

## 2020-07-25 DIAGNOSIS — I5022 Chronic systolic (congestive) heart failure: Secondary | ICD-10-CM | POA: Diagnosis not present

## 2020-07-25 DIAGNOSIS — Z95810 Presence of automatic (implantable) cardiac defibrillator: Secondary | ICD-10-CM | POA: Diagnosis not present

## 2020-07-25 DIAGNOSIS — N1832 Chronic kidney disease, stage 3b: Secondary | ICD-10-CM | POA: Diagnosis not present

## 2020-07-25 DIAGNOSIS — I255 Ischemic cardiomyopathy: Secondary | ICD-10-CM | POA: Diagnosis not present

## 2020-07-25 DIAGNOSIS — I2510 Atherosclerotic heart disease of native coronary artery without angina pectoris: Secondary | ICD-10-CM | POA: Diagnosis not present

## 2020-10-01 DIAGNOSIS — Z961 Presence of intraocular lens: Secondary | ICD-10-CM | POA: Diagnosis not present

## 2020-10-01 DIAGNOSIS — H353232 Exudative age-related macular degeneration, bilateral, with inactive choroidal neovascularization: Secondary | ICD-10-CM | POA: Diagnosis not present

## 2020-10-10 ENCOUNTER — Other Ambulatory Visit: Payer: Self-pay | Admitting: Urology

## 2020-10-10 DIAGNOSIS — N401 Enlarged prostate with lower urinary tract symptoms: Secondary | ICD-10-CM

## 2020-11-18 DIAGNOSIS — I739 Peripheral vascular disease, unspecified: Secondary | ICD-10-CM | POA: Diagnosis not present

## 2020-11-18 DIAGNOSIS — Z95810 Presence of automatic (implantable) cardiac defibrillator: Secondary | ICD-10-CM | POA: Diagnosis not present

## 2020-11-18 DIAGNOSIS — Z85828 Personal history of other malignant neoplasm of skin: Secondary | ICD-10-CM | POA: Diagnosis not present

## 2020-11-18 DIAGNOSIS — I10 Essential (primary) hypertension: Secondary | ICD-10-CM | POA: Diagnosis not present

## 2020-11-18 DIAGNOSIS — Z79899 Other long term (current) drug therapy: Secondary | ICD-10-CM | POA: Diagnosis not present

## 2020-11-18 DIAGNOSIS — R7309 Other abnormal glucose: Secondary | ICD-10-CM | POA: Diagnosis not present

## 2020-11-18 DIAGNOSIS — N1832 Chronic kidney disease, stage 3b: Secondary | ICD-10-CM | POA: Diagnosis not present

## 2020-11-18 DIAGNOSIS — D649 Anemia, unspecified: Secondary | ICD-10-CM | POA: Diagnosis not present

## 2020-11-18 DIAGNOSIS — I5022 Chronic systolic (congestive) heart failure: Secondary | ICD-10-CM | POA: Diagnosis not present

## 2020-11-18 DIAGNOSIS — Z720 Tobacco use: Secondary | ICD-10-CM | POA: Diagnosis not present

## 2020-11-18 DIAGNOSIS — I255 Ischemic cardiomyopathy: Secondary | ICD-10-CM | POA: Diagnosis not present

## 2020-11-18 DIAGNOSIS — I2510 Atherosclerotic heart disease of native coronary artery without angina pectoris: Secondary | ICD-10-CM | POA: Diagnosis not present

## 2020-11-26 DIAGNOSIS — R809 Proteinuria, unspecified: Secondary | ICD-10-CM | POA: Diagnosis not present

## 2020-11-26 DIAGNOSIS — I10 Essential (primary) hypertension: Secondary | ICD-10-CM | POA: Diagnosis not present

## 2020-11-26 DIAGNOSIS — R21 Rash and other nonspecific skin eruption: Secondary | ICD-10-CM | POA: Diagnosis not present

## 2020-11-26 DIAGNOSIS — D649 Anemia, unspecified: Secondary | ICD-10-CM | POA: Diagnosis not present

## 2020-11-26 DIAGNOSIS — N401 Enlarged prostate with lower urinary tract symptoms: Secondary | ICD-10-CM | POA: Diagnosis not present

## 2020-11-26 DIAGNOSIS — I5022 Chronic systolic (congestive) heart failure: Secondary | ICD-10-CM | POA: Diagnosis not present

## 2020-11-26 DIAGNOSIS — N1832 Chronic kidney disease, stage 3b: Secondary | ICD-10-CM | POA: Diagnosis not present

## 2020-11-26 DIAGNOSIS — Z720 Tobacco use: Secondary | ICD-10-CM | POA: Diagnosis not present

## 2020-11-26 DIAGNOSIS — Z95810 Presence of automatic (implantable) cardiac defibrillator: Secondary | ICD-10-CM | POA: Diagnosis not present

## 2020-11-26 DIAGNOSIS — R7309 Other abnormal glucose: Secondary | ICD-10-CM | POA: Diagnosis not present

## 2020-12-17 DIAGNOSIS — D649 Anemia, unspecified: Secondary | ICD-10-CM | POA: Diagnosis not present

## 2020-12-17 DIAGNOSIS — N1832 Chronic kidney disease, stage 3b: Secondary | ICD-10-CM | POA: Diagnosis not present

## 2020-12-17 DIAGNOSIS — I509 Heart failure, unspecified: Secondary | ICD-10-CM | POA: Diagnosis not present

## 2020-12-17 DIAGNOSIS — I255 Ischemic cardiomyopathy: Secondary | ICD-10-CM | POA: Diagnosis not present

## 2020-12-17 DIAGNOSIS — I10 Essential (primary) hypertension: Secondary | ICD-10-CM | POA: Diagnosis not present

## 2021-01-08 DIAGNOSIS — Z955 Presence of coronary angioplasty implant and graft: Secondary | ICD-10-CM | POA: Diagnosis not present

## 2021-01-08 DIAGNOSIS — I255 Ischemic cardiomyopathy: Secondary | ICD-10-CM | POA: Diagnosis not present

## 2021-01-08 DIAGNOSIS — I739 Peripheral vascular disease, unspecified: Secondary | ICD-10-CM | POA: Diagnosis not present

## 2021-01-08 DIAGNOSIS — E669 Obesity, unspecified: Secondary | ICD-10-CM | POA: Diagnosis not present

## 2021-01-08 DIAGNOSIS — I5020 Unspecified systolic (congestive) heart failure: Secondary | ICD-10-CM | POA: Diagnosis not present

## 2021-01-08 DIAGNOSIS — I712 Thoracic aortic aneurysm, without rupture: Secondary | ICD-10-CM | POA: Diagnosis not present

## 2021-01-08 DIAGNOSIS — N183 Chronic kidney disease, stage 3 unspecified: Secondary | ICD-10-CM | POA: Diagnosis not present

## 2021-01-08 DIAGNOSIS — I25118 Atherosclerotic heart disease of native coronary artery with other forms of angina pectoris: Secondary | ICD-10-CM | POA: Diagnosis not present

## 2021-01-08 DIAGNOSIS — I10 Essential (primary) hypertension: Secondary | ICD-10-CM | POA: Diagnosis not present

## 2021-01-08 DIAGNOSIS — Z95810 Presence of automatic (implantable) cardiac defibrillator: Secondary | ICD-10-CM | POA: Diagnosis not present

## 2021-01-08 DIAGNOSIS — Z720 Tobacco use: Secondary | ICD-10-CM | POA: Diagnosis not present

## 2021-01-08 DIAGNOSIS — E782 Mixed hyperlipidemia: Secondary | ICD-10-CM | POA: Diagnosis not present

## 2021-01-16 DIAGNOSIS — R053 Chronic cough: Secondary | ICD-10-CM | POA: Diagnosis not present

## 2021-01-17 DIAGNOSIS — R053 Chronic cough: Secondary | ICD-10-CM | POA: Diagnosis not present

## 2021-01-27 DIAGNOSIS — D044 Carcinoma in situ of skin of scalp and neck: Secondary | ICD-10-CM | POA: Diagnosis not present

## 2021-01-27 DIAGNOSIS — D2271 Melanocytic nevi of right lower limb, including hip: Secondary | ICD-10-CM | POA: Diagnosis not present

## 2021-01-27 DIAGNOSIS — Z85828 Personal history of other malignant neoplasm of skin: Secondary | ICD-10-CM | POA: Diagnosis not present

## 2021-01-27 DIAGNOSIS — Z85820 Personal history of malignant melanoma of skin: Secondary | ICD-10-CM | POA: Diagnosis not present

## 2021-01-27 DIAGNOSIS — X32XXXA Exposure to sunlight, initial encounter: Secondary | ICD-10-CM | POA: Diagnosis not present

## 2021-01-27 DIAGNOSIS — D2261 Melanocytic nevi of right upper limb, including shoulder: Secondary | ICD-10-CM | POA: Diagnosis not present

## 2021-01-27 DIAGNOSIS — D485 Neoplasm of uncertain behavior of skin: Secondary | ICD-10-CM | POA: Diagnosis not present

## 2021-01-27 DIAGNOSIS — L821 Other seborrheic keratosis: Secondary | ICD-10-CM | POA: Diagnosis not present

## 2021-01-27 DIAGNOSIS — L570 Actinic keratosis: Secondary | ICD-10-CM | POA: Diagnosis not present

## 2021-02-01 IMAGING — US US EXTREM LOW VENOUS*R*
1 series · 13 of 24 positions shown · non-contrast
Comparison: None.

CLINICAL DATA: 78-year-old male with history of leg edema.

EXAM:
RIGHT LOWER EXTREMITY VENOUS DOPPLER ULTRASOUND
TECHNIQUE: Gray-scale sonography with graded compression, as well as color
Doppler and duplex ultrasound were performed to evaluate the right
lower extremity deep venous systems from the level of the common
femoral vein and including the common femoral, femoral, profunda
femoral, popliteal and calf veins including the posterior tibial,
peroneal and gastrocnemius veins when visible. Spectral Doppler was
utilized to evaluate flow at rest and with distal augmentation
maneuvers in the common femoral, femoral and popliteal veins. The
contralateral common femoral vein was also evaluated for comparison.

[Series 1: us venous img lower uni right (dvt) · portal-venous · 13 of 37 slices shown]
[im 1/37]
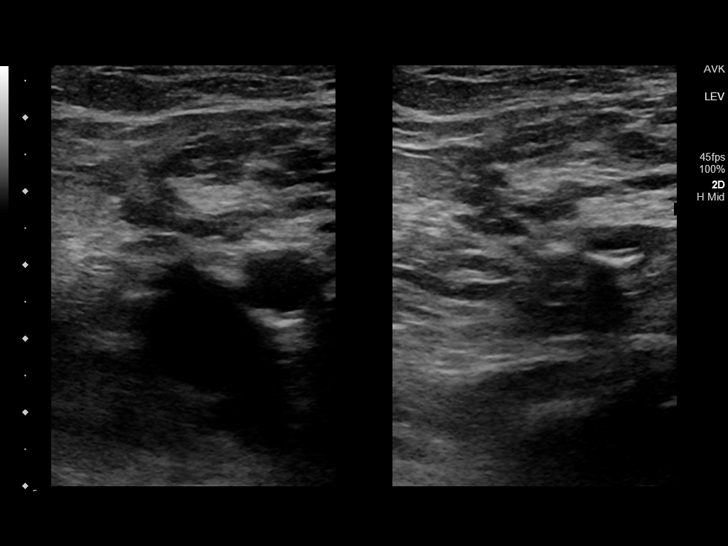
[im 4/37]
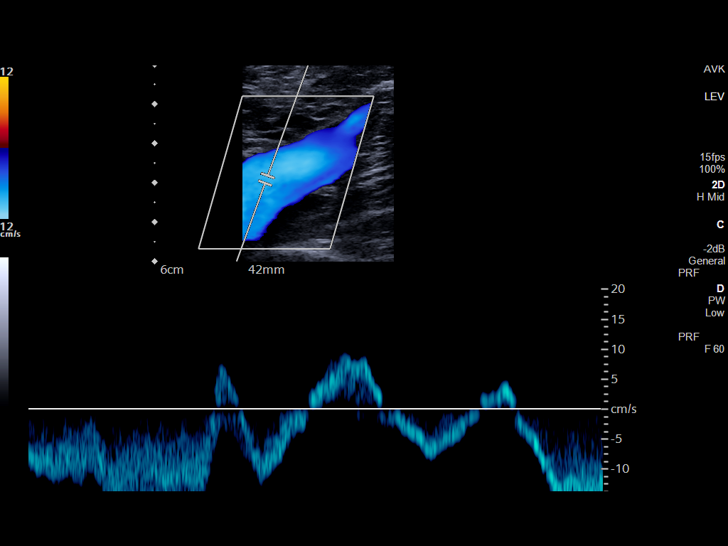
[im 7/37]
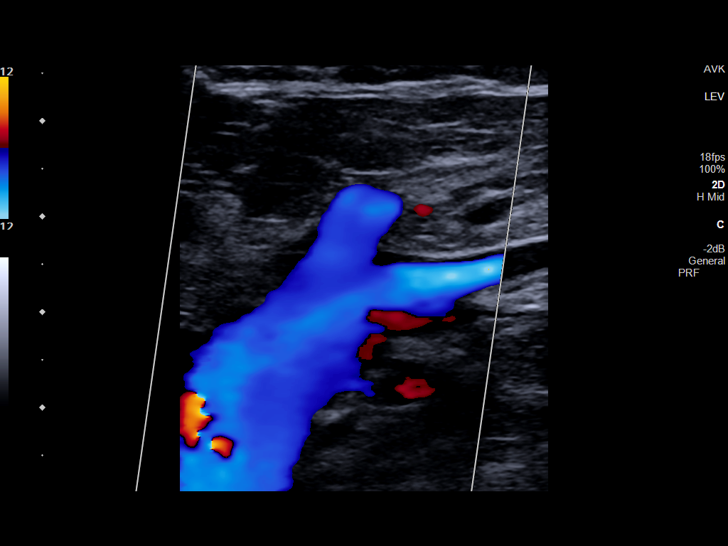
[im 10/37]
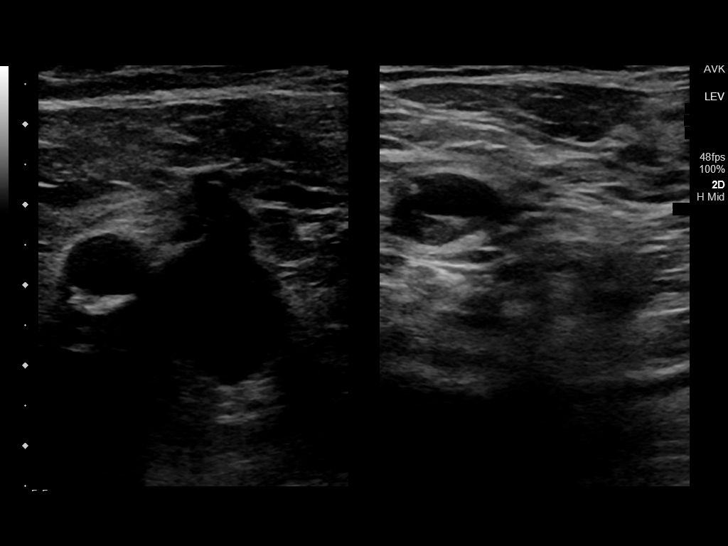
[im 13/37]
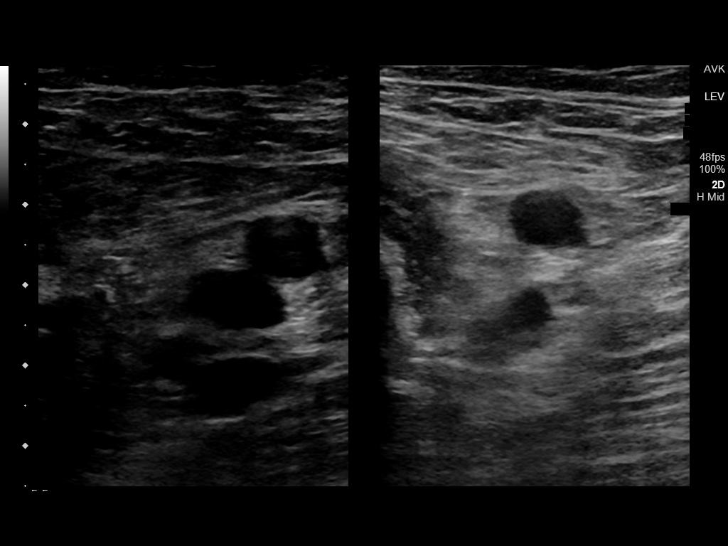
[im 16/37]
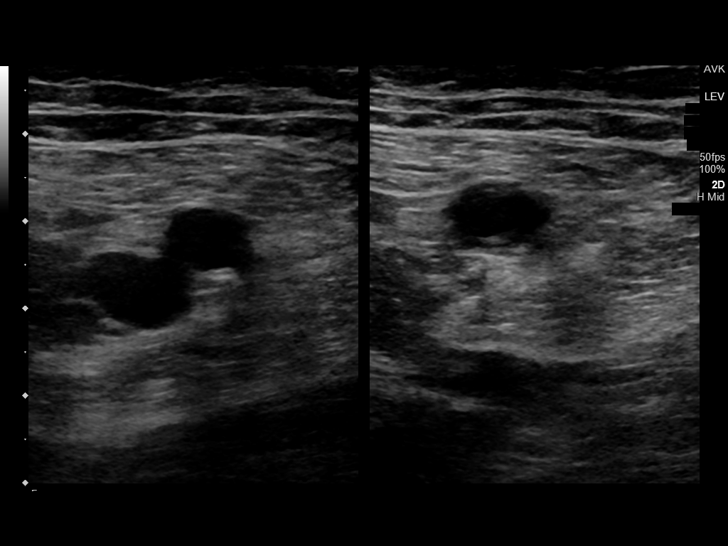
[im 19/37]
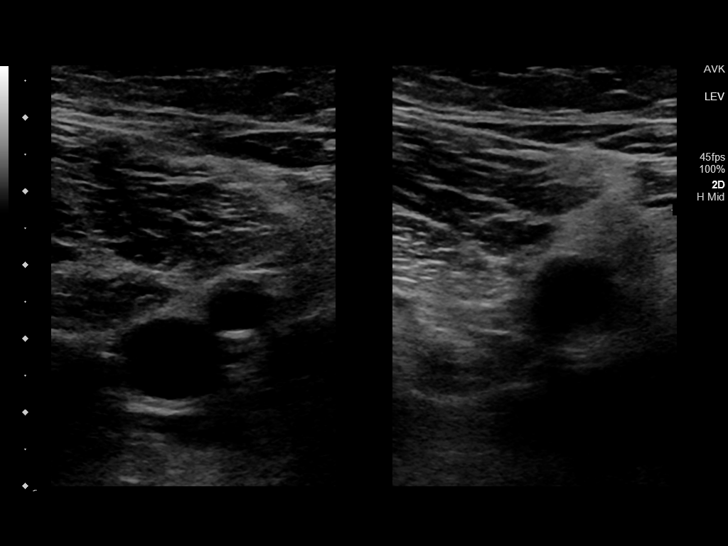
[im 21/37]
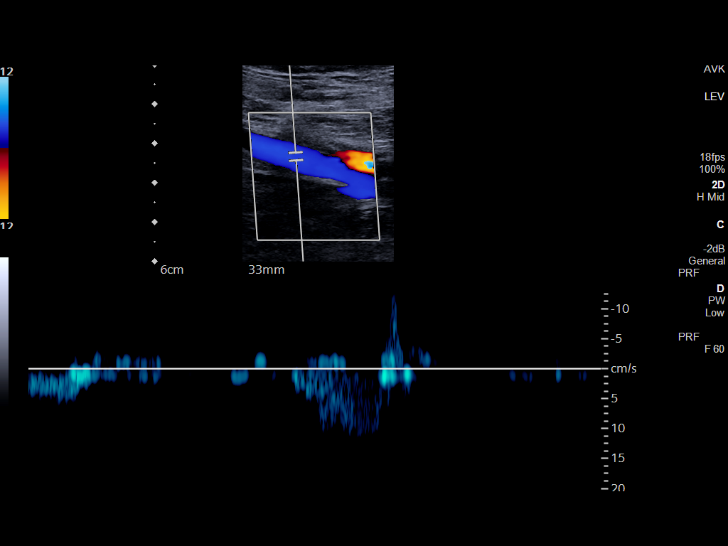
[im 24/37]
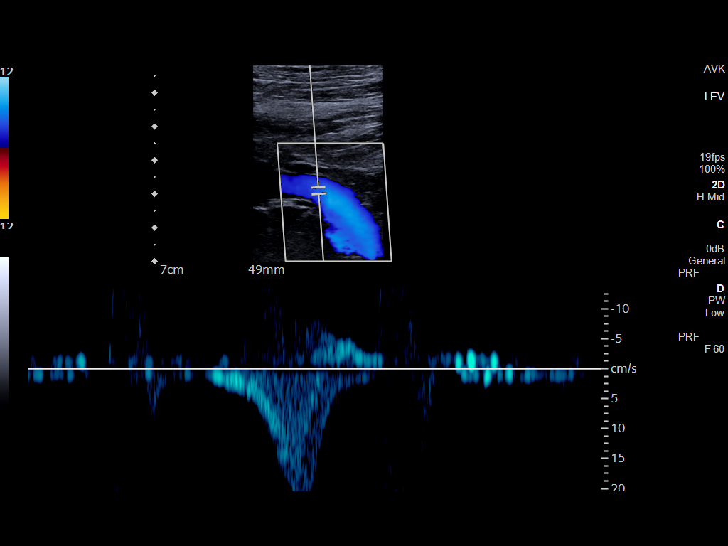
[im 27/37]
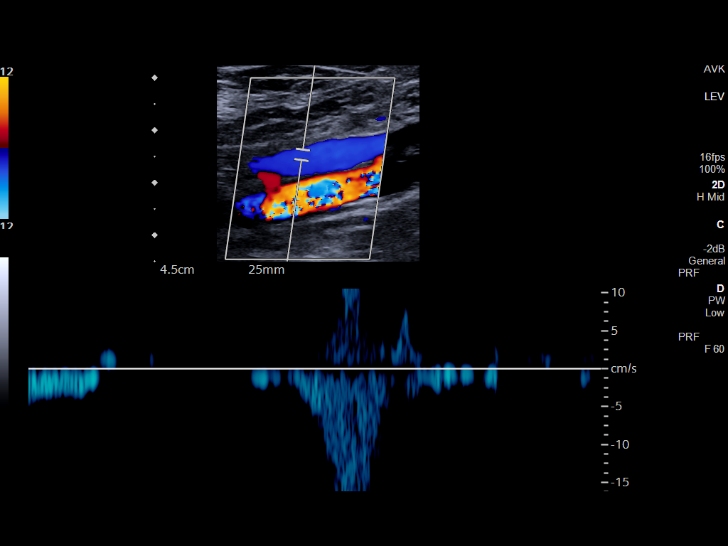
[im 30/37]
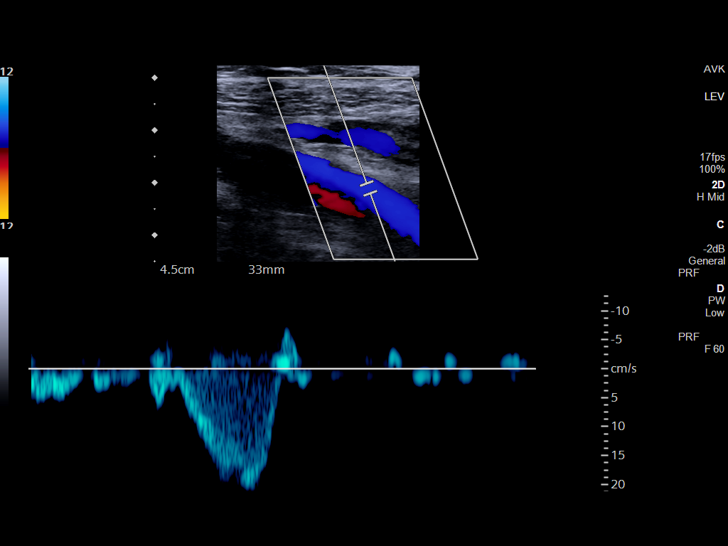
[im 33/37]
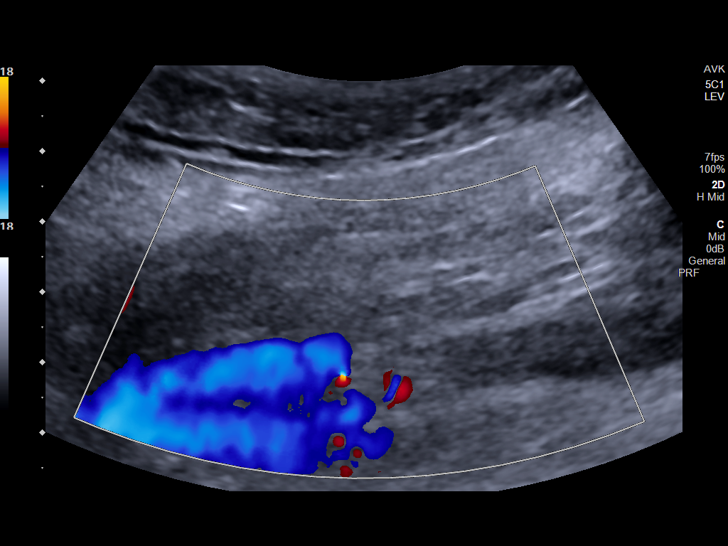
[im 37/37]
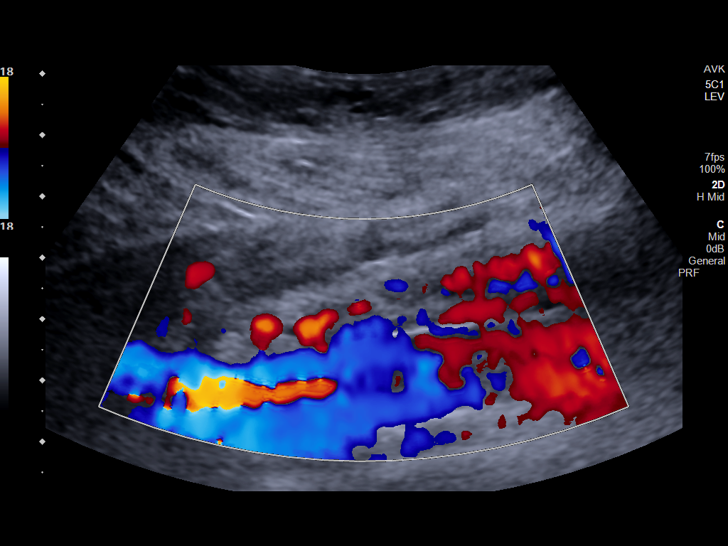

[13 of 24 positions shown; findings below may reference images not displayed]

FINDINGS: RIGHT LOWER EXTREMITY

Common Femoral Vein: No evidence of thrombus. Normal
compressibility, respiratory phasicity and response to augmentation.

Central Greater Saphenous Vein: No evidence of thrombus. Normal
compressibility and flow on color Doppler imaging.

Central Profunda Femoral Vein: No evidence of thrombus. Normal
compressibility and flow on color Doppler imaging.

Femoral Vein: No evidence of thrombus. Normal compressibility,
respiratory phasicity and response to augmentation.

Popliteal Vein: No evidence of thrombus. Normal compressibility,
respiratory phasicity and response to augmentation.

Calf Veins: No evidence of thrombus. Normal compressibility and flow
on color Doppler imaging.

Other Findings:  None.

LEFT LOWER EXTREMITY

Common Femoral Vein: No evidence of thrombus. Normal
compressibility, respiratory phasicity and response to augmentation.
IMPRESSION: No evidence of right lower extremity deep venous thrombosis.

## 2021-02-18 DIAGNOSIS — I5022 Chronic systolic (congestive) heart failure: Secondary | ICD-10-CM | POA: Diagnosis not present

## 2021-02-26 ENCOUNTER — Ambulatory Visit: Payer: Self-pay | Admitting: Urology

## 2021-02-27 ENCOUNTER — Other Ambulatory Visit: Payer: Self-pay

## 2021-02-27 ENCOUNTER — Ambulatory Visit: Payer: PPO | Admitting: Urology

## 2021-02-27 ENCOUNTER — Encounter: Payer: Self-pay | Admitting: Urology

## 2021-02-27 VITALS — BP 189/76 | HR 71 | Ht 70.0 in | Wt 210.0 lb

## 2021-02-27 DIAGNOSIS — N401 Enlarged prostate with lower urinary tract symptoms: Secondary | ICD-10-CM | POA: Diagnosis not present

## 2021-02-27 LAB — BLADDER SCAN AMB NON-IMAGING

## 2021-02-27 NOTE — Patient Instructions (Signed)

## 2021-02-27 NOTE — Progress Notes (Signed)
   02/27/2021 1:55 PM   Peter [name] September 12, 1940 [id number]  Reason for visit: Follow up BPH and urinary symptoms, UTIs  HPI: Comorbid 80 year old male previously followed by Dr. Pilar Jarvis with stents for CAD and remains on aspirin and Plavix.  He is on maximal medical therapy with Flomax and finasteride for BPH with symptoms of weak stream and difficulty initiating urination.  Prostate measures 123 g on prior CT.  He had a culture documented E. coli UTI in August 2021 treated with culture appropriate antibiotics, he denies any UTIs over the last year.  Overall urinary symptoms are stable over the last year, and really his main issue is just difficulty initiating the stream in the morning that improves throughout the day.  PVR is normal today at 29 mL.  We again reviewed options like HOLEP with his large prostate versus maximal medical therapy, and risks and benefits discussed.  He would like to continue maximal medical therapy which is very reasonable.  Return precautions discussed.  Continue Flomax and finasteride RTC 1 year PVR     Billey Co, MD  [facility] [address], [place], [place] [address] [phone]

## 2021-03-03 DIAGNOSIS — I5020 Unspecified systolic (congestive) heart failure: Secondary | ICD-10-CM | POA: Diagnosis not present

## 2021-03-10 DIAGNOSIS — I255 Ischemic cardiomyopathy: Secondary | ICD-10-CM | POA: Diagnosis not present

## 2021-03-10 DIAGNOSIS — Z9889 Other specified postprocedural states: Secondary | ICD-10-CM | POA: Diagnosis not present

## 2021-03-10 DIAGNOSIS — Z720 Tobacco use: Secondary | ICD-10-CM | POA: Diagnosis not present

## 2021-03-10 DIAGNOSIS — N183 Chronic kidney disease, stage 3 unspecified: Secondary | ICD-10-CM | POA: Diagnosis not present

## 2021-03-10 DIAGNOSIS — I10 Essential (primary) hypertension: Secondary | ICD-10-CM | POA: Diagnosis not present

## 2021-03-10 DIAGNOSIS — R600 Localized edema: Secondary | ICD-10-CM | POA: Diagnosis not present

## 2021-03-10 DIAGNOSIS — E782 Mixed hyperlipidemia: Secondary | ICD-10-CM | POA: Diagnosis not present

## 2021-03-10 DIAGNOSIS — Z95810 Presence of automatic (implantable) cardiac defibrillator: Secondary | ICD-10-CM | POA: Diagnosis not present

## 2021-03-10 DIAGNOSIS — I2510 Atherosclerotic heart disease of native coronary artery without angina pectoris: Secondary | ICD-10-CM | POA: Diagnosis not present

## 2021-03-10 DIAGNOSIS — I5020 Unspecified systolic (congestive) heart failure: Secondary | ICD-10-CM | POA: Diagnosis not present

## 2021-03-10 DIAGNOSIS — I739 Peripheral vascular disease, unspecified: Secondary | ICD-10-CM | POA: Diagnosis not present

## 2021-03-10 DIAGNOSIS — Z955 Presence of coronary angioplasty implant and graft: Secondary | ICD-10-CM | POA: Diagnosis not present

## 2021-03-17 DIAGNOSIS — I10 Essential (primary) hypertension: Secondary | ICD-10-CM | POA: Diagnosis not present

## 2021-03-17 DIAGNOSIS — J40 Bronchitis, not specified as acute or chronic: Secondary | ICD-10-CM | POA: Diagnosis not present

## 2021-03-17 DIAGNOSIS — N1832 Chronic kidney disease, stage 3b: Secondary | ICD-10-CM | POA: Diagnosis not present

## 2021-03-17 DIAGNOSIS — I5022 Chronic systolic (congestive) heart failure: Secondary | ICD-10-CM | POA: Diagnosis not present

## 2021-03-25 DIAGNOSIS — I10 Essential (primary) hypertension: Secondary | ICD-10-CM | POA: Diagnosis not present

## 2021-03-25 DIAGNOSIS — J40 Bronchitis, not specified as acute or chronic: Secondary | ICD-10-CM | POA: Diagnosis not present

## 2021-03-25 DIAGNOSIS — I5022 Chronic systolic (congestive) heart failure: Secondary | ICD-10-CM | POA: Diagnosis not present

## 2021-04-03 ENCOUNTER — Encounter: Payer: Self-pay | Admitting: *Deleted

## 2021-04-03 ENCOUNTER — Inpatient Hospital Stay
Admission: EM | Admit: 2021-04-03 | Discharge: 2021-04-09 | DRG: 291 | Disposition: A | Discharge status: Home Health | Payer: PPO | Attending: Family Medicine | Admitting: Family Medicine

## 2021-04-03 ENCOUNTER — Emergency Department: Payer: PPO

## 2021-04-03 ENCOUNTER — Other Ambulatory Visit: Payer: Self-pay

## 2021-04-03 DIAGNOSIS — E7800 Pure hypercholesterolemia, unspecified: Secondary | ICD-10-CM | POA: Diagnosis present

## 2021-04-03 DIAGNOSIS — Z79899 Other long term (current) drug therapy: Secondary | ICD-10-CM

## 2021-04-03 DIAGNOSIS — N184 Chronic kidney disease, stage 4 (severe): Secondary | ICD-10-CM | POA: Diagnosis present

## 2021-04-03 DIAGNOSIS — R609 Edema, unspecified: Secondary | ICD-10-CM | POA: Diagnosis not present

## 2021-04-03 DIAGNOSIS — Z20822 Contact with and (suspected) exposure to covid-19: Secondary | ICD-10-CM | POA: Diagnosis present

## 2021-04-03 DIAGNOSIS — D696 Thrombocytopenia, unspecified: Secondary | ICD-10-CM | POA: Diagnosis present

## 2021-04-03 DIAGNOSIS — R0602 Shortness of breath: Secondary | ICD-10-CM | POA: Diagnosis not present

## 2021-04-03 DIAGNOSIS — I509 Heart failure, unspecified: Secondary | ICD-10-CM | POA: Diagnosis present

## 2021-04-03 DIAGNOSIS — I739 Peripheral vascular disease, unspecified: Secondary | ICD-10-CM | POA: Diagnosis present

## 2021-04-03 DIAGNOSIS — Z8679 Personal history of other diseases of the circulatory system: Secondary | ICD-10-CM | POA: Diagnosis not present

## 2021-04-03 DIAGNOSIS — D649 Anemia, unspecified: Secondary | ICD-10-CM | POA: Diagnosis not present

## 2021-04-03 DIAGNOSIS — Z9889 Other specified postprocedural states: Secondary | ICD-10-CM

## 2021-04-03 DIAGNOSIS — I5033 Acute on chronic diastolic (congestive) heart failure: Secondary | ICD-10-CM | POA: Diagnosis present

## 2021-04-03 DIAGNOSIS — I130 Hypertensive heart and chronic kidney disease with heart failure and stage 1 through stage 4 chronic kidney disease, or unspecified chronic kidney disease: Secondary | ICD-10-CM | POA: Diagnosis present

## 2021-04-03 DIAGNOSIS — N138 Other obstructive and reflux uropathy: Secondary | ICD-10-CM | POA: Diagnosis present

## 2021-04-03 DIAGNOSIS — N401 Enlarged prostate with lower urinary tract symptoms: Secondary | ICD-10-CM | POA: Diagnosis present

## 2021-04-03 DIAGNOSIS — I129 Hypertensive chronic kidney disease with stage 1 through stage 4 chronic kidney disease, or unspecified chronic kidney disease: Secondary | ICD-10-CM | POA: Diagnosis not present

## 2021-04-03 DIAGNOSIS — E669 Obesity, unspecified: Secondary | ICD-10-CM | POA: Diagnosis present

## 2021-04-03 DIAGNOSIS — Z955 Presence of coronary angioplasty implant and graft: Secondary | ICD-10-CM | POA: Diagnosis not present

## 2021-04-03 DIAGNOSIS — Z91041 Radiographic dye allergy status: Secondary | ICD-10-CM

## 2021-04-03 DIAGNOSIS — Z6831 Body mass index (BMI) 31.0-31.9, adult: Secondary | ICD-10-CM

## 2021-04-03 DIAGNOSIS — J449 Chronic obstructive pulmonary disease, unspecified: Secondary | ICD-10-CM | POA: Diagnosis present

## 2021-04-03 DIAGNOSIS — I252 Old myocardial infarction: Secondary | ICD-10-CM

## 2021-04-03 DIAGNOSIS — Z87891 Personal history of nicotine dependence: Secondary | ICD-10-CM | POA: Diagnosis not present

## 2021-04-03 DIAGNOSIS — I2510 Atherosclerotic heart disease of native coronary artery without angina pectoris: Secondary | ICD-10-CM | POA: Diagnosis present

## 2021-04-03 DIAGNOSIS — R0600 Dyspnea, unspecified: Secondary | ICD-10-CM | POA: Diagnosis not present

## 2021-04-03 DIAGNOSIS — K8020 Calculus of gallbladder without cholecystitis without obstruction: Secondary | ICD-10-CM | POA: Diagnosis not present

## 2021-04-03 DIAGNOSIS — I5023 Acute on chronic systolic (congestive) heart failure: Secondary | ICD-10-CM

## 2021-04-03 DIAGNOSIS — Z95810 Presence of automatic (implantable) cardiac defibrillator: Secondary | ICD-10-CM

## 2021-04-03 DIAGNOSIS — D631 Anemia in chronic kidney disease: Secondary | ICD-10-CM | POA: Diagnosis present

## 2021-04-03 DIAGNOSIS — I248 Other forms of acute ischemic heart disease: Secondary | ICD-10-CM | POA: Diagnosis present

## 2021-04-03 DIAGNOSIS — K439 Ventral hernia without obstruction or gangrene: Secondary | ICD-10-CM | POA: Diagnosis not present

## 2021-04-03 DIAGNOSIS — K5790 Diverticulosis of intestine, part unspecified, without perforation or abscess without bleeding: Secondary | ICD-10-CM | POA: Diagnosis not present

## 2021-04-03 DIAGNOSIS — R600 Localized edema: Secondary | ICD-10-CM | POA: Diagnosis not present

## 2021-04-03 DIAGNOSIS — Z7902 Long term (current) use of antithrombotics/antiplatelets: Secondary | ICD-10-CM

## 2021-04-03 DIAGNOSIS — D61818 Other pancytopenia: Secondary | ICD-10-CM | POA: Diagnosis not present

## 2021-04-03 DIAGNOSIS — I447 Left bundle-branch block, unspecified: Secondary | ICD-10-CM | POA: Diagnosis present

## 2021-04-03 DIAGNOSIS — I110 Hypertensive heart disease with heart failure: Secondary | ICD-10-CM | POA: Diagnosis not present

## 2021-04-03 DIAGNOSIS — I255 Ischemic cardiomyopathy: Secondary | ICD-10-CM | POA: Diagnosis present

## 2021-04-03 DIAGNOSIS — R069 Unspecified abnormalities of breathing: Secondary | ICD-10-CM | POA: Diagnosis not present

## 2021-04-03 DIAGNOSIS — I10 Essential (primary) hypertension: Secondary | ICD-10-CM | POA: Diagnosis not present

## 2021-04-03 DIAGNOSIS — N179 Acute kidney failure, unspecified: Secondary | ICD-10-CM | POA: Diagnosis present

## 2021-04-03 DIAGNOSIS — H3530 Unspecified macular degeneration: Secondary | ICD-10-CM | POA: Diagnosis present

## 2021-04-03 DIAGNOSIS — I120 Hypertensive chronic kidney disease with stage 5 chronic kidney disease or end stage renal disease: Secondary | ICD-10-CM | POA: Diagnosis not present

## 2021-04-03 DIAGNOSIS — N281 Cyst of kidney, acquired: Secondary | ICD-10-CM | POA: Diagnosis not present

## 2021-04-03 DIAGNOSIS — I5032 Chronic diastolic (congestive) heart failure: Secondary | ICD-10-CM | POA: Diagnosis not present

## 2021-04-03 LAB — CBC
HCT: 18 % — ABNORMAL LOW (ref 39.0–52.0)
Hemoglobin: 5.7 g/dL — ABNORMAL LOW (ref 13.0–17.0)
MCH: 29.7 pg (ref 26.0–34.0)
MCHC: 31.7 g/dL (ref 30.0–36.0)
MCV: 93.8 fL (ref 80.0–100.0)
Platelets: 65 10*3/uL — ABNORMAL LOW (ref 150–400)
RBC: 1.92 MIL/uL — ABNORMAL LOW (ref 4.22–5.81)
RDW: 14.1 % (ref 11.5–15.5)
WBC: 4.9 10*3/uL (ref 4.0–10.5)
nRBC: 0 % (ref 0.0–0.2)

## 2021-04-03 LAB — BASIC METABOLIC PANEL
Anion gap: 4 — ABNORMAL LOW (ref 5–15)
BUN: 67 mg/dL — ABNORMAL HIGH (ref 8–23)
CO2: 23 mmol/L (ref 22–32)
Calcium: 7.5 mg/dL — ABNORMAL LOW (ref 8.9–10.3)
Chloride: 111 mmol/L (ref 98–111)
Creatinine, Ser: 2.95 mg/dL — ABNORMAL HIGH (ref 0.61–1.24)
GFR, Estimated: 21 mL/min — ABNORMAL LOW (ref >60)
Glucose, Bld: 152 mg/dL — ABNORMAL HIGH (ref 70–99)
Potassium: 4.6 mmol/L (ref 3.5–5.1)
Sodium: 138 mmol/L (ref 135–145)

## 2021-04-03 LAB — TROPONIN I (HIGH SENSITIVITY): Troponin I (High Sensitivity): 262 ng/L

## 2021-04-03 NOTE — ED Triage Notes (Signed)
BIB EMS from home.  Hx of CHF.  Compliant with lasix.  Progressive SHOB since Thanksgiving. 12lb weight gain in the past 3 days.  Lungs clear with EMS.  170/106, HR 60s, demand paced, 20G LAC.  Sats 98% on RA

## 2021-04-03 NOTE — ED Triage Notes (Signed)
Pt brought in by ems from home.  Pt has sob and swelling of lower legs   no chest pain.  Iv in place.  Pt alert  speech clear.

## 2021-04-04 ENCOUNTER — Inpatient Hospital Stay: Payer: PPO

## 2021-04-04 ENCOUNTER — Emergency Department: Payer: PPO

## 2021-04-04 ENCOUNTER — Inpatient Hospital Stay
Admit: 2021-04-04 | Discharge: 2021-04-04 | Disposition: A | Discharge status: Home / Self Care | Payer: PPO | Attending: Internal Medicine | Admitting: Internal Medicine

## 2021-04-04 DIAGNOSIS — I9789 Other postprocedural complications and disorders of the circulatory system, not elsewhere classified: Secondary | ICD-10-CM | POA: Insufficient documentation

## 2021-04-04 DIAGNOSIS — J449 Chronic obstructive pulmonary disease, unspecified: Secondary | ICD-10-CM | POA: Diagnosis present

## 2021-04-04 DIAGNOSIS — Z955 Presence of coronary angioplasty implant and graft: Secondary | ICD-10-CM | POA: Diagnosis not present

## 2021-04-04 DIAGNOSIS — I739 Peripheral vascular disease, unspecified: Secondary | ICD-10-CM | POA: Diagnosis present

## 2021-04-04 DIAGNOSIS — I130 Hypertensive heart and chronic kidney disease with heart failure and stage 1 through stage 4 chronic kidney disease, or unspecified chronic kidney disease: Secondary | ICD-10-CM | POA: Diagnosis present

## 2021-04-04 DIAGNOSIS — Z8679 Personal history of other diseases of the circulatory system: Secondary | ICD-10-CM

## 2021-04-04 DIAGNOSIS — I447 Left bundle-branch block, unspecified: Secondary | ICD-10-CM | POA: Diagnosis present

## 2021-04-04 DIAGNOSIS — N281 Cyst of kidney, acquired: Secondary | ICD-10-CM | POA: Diagnosis not present

## 2021-04-04 DIAGNOSIS — Z6831 Body mass index (BMI) 31.0-31.9, adult: Secondary | ICD-10-CM | POA: Diagnosis not present

## 2021-04-04 DIAGNOSIS — D696 Thrombocytopenia, unspecified: Secondary | ICD-10-CM

## 2021-04-04 DIAGNOSIS — N184 Chronic kidney disease, stage 4 (severe): Secondary | ICD-10-CM

## 2021-04-04 DIAGNOSIS — Z79899 Other long term (current) drug therapy: Secondary | ICD-10-CM | POA: Diagnosis not present

## 2021-04-04 DIAGNOSIS — I509 Heart failure, unspecified: Secondary | ICD-10-CM | POA: Diagnosis not present

## 2021-04-04 DIAGNOSIS — I2510 Atherosclerotic heart disease of native coronary artery without angina pectoris: Secondary | ICD-10-CM | POA: Diagnosis present

## 2021-04-04 DIAGNOSIS — N138 Other obstructive and reflux uropathy: Secondary | ICD-10-CM | POA: Diagnosis not present

## 2021-04-04 DIAGNOSIS — R0600 Dyspnea, unspecified: Secondary | ICD-10-CM | POA: Diagnosis not present

## 2021-04-04 DIAGNOSIS — Z20822 Contact with and (suspected) exposure to covid-19: Secondary | ICD-10-CM | POA: Diagnosis present

## 2021-04-04 DIAGNOSIS — D649 Anemia, unspecified: Secondary | ICD-10-CM | POA: Diagnosis present

## 2021-04-04 DIAGNOSIS — N179 Acute kidney failure, unspecified: Secondary | ICD-10-CM | POA: Diagnosis not present

## 2021-04-04 DIAGNOSIS — I120 Hypertensive chronic kidney disease with stage 5 chronic kidney disease or end stage renal disease: Secondary | ICD-10-CM | POA: Diagnosis not present

## 2021-04-04 DIAGNOSIS — K8020 Calculus of gallbladder without cholecystitis without obstruction: Secondary | ICD-10-CM | POA: Diagnosis not present

## 2021-04-04 DIAGNOSIS — H3530 Unspecified macular degeneration: Secondary | ICD-10-CM | POA: Diagnosis present

## 2021-04-04 DIAGNOSIS — Z87891 Personal history of nicotine dependence: Secondary | ICD-10-CM | POA: Diagnosis not present

## 2021-04-04 DIAGNOSIS — K439 Ventral hernia without obstruction or gangrene: Secondary | ICD-10-CM | POA: Diagnosis not present

## 2021-04-04 DIAGNOSIS — Z9889 Other specified postprocedural states: Secondary | ICD-10-CM | POA: Diagnosis not present

## 2021-04-04 DIAGNOSIS — D62 Acute posthemorrhagic anemia: Secondary | ICD-10-CM | POA: Insufficient documentation

## 2021-04-04 DIAGNOSIS — E669 Obesity, unspecified: Secondary | ICD-10-CM | POA: Diagnosis present

## 2021-04-04 DIAGNOSIS — I252 Old myocardial infarction: Secondary | ICD-10-CM | POA: Diagnosis not present

## 2021-04-04 DIAGNOSIS — R0602 Shortness of breath: Secondary | ICD-10-CM | POA: Diagnosis not present

## 2021-04-04 DIAGNOSIS — I129 Hypertensive chronic kidney disease with stage 1 through stage 4 chronic kidney disease, or unspecified chronic kidney disease: Secondary | ICD-10-CM | POA: Diagnosis not present

## 2021-04-04 DIAGNOSIS — Z95810 Presence of automatic (implantable) cardiac defibrillator: Secondary | ICD-10-CM | POA: Diagnosis not present

## 2021-04-04 DIAGNOSIS — I5032 Chronic diastolic (congestive) heart failure: Secondary | ICD-10-CM | POA: Diagnosis not present

## 2021-04-04 DIAGNOSIS — I5033 Acute on chronic diastolic (congestive) heart failure: Secondary | ICD-10-CM | POA: Diagnosis present

## 2021-04-04 DIAGNOSIS — Z7902 Long term (current) use of antithrombotics/antiplatelets: Secondary | ICD-10-CM | POA: Diagnosis not present

## 2021-04-04 DIAGNOSIS — D61818 Other pancytopenia: Secondary | ICD-10-CM | POA: Diagnosis not present

## 2021-04-04 DIAGNOSIS — D631 Anemia in chronic kidney disease: Secondary | ICD-10-CM | POA: Diagnosis present

## 2021-04-04 DIAGNOSIS — K5790 Diverticulosis of intestine, part unspecified, without perforation or abscess without bleeding: Secondary | ICD-10-CM | POA: Diagnosis not present

## 2021-04-04 DIAGNOSIS — N401 Enlarged prostate with lower urinary tract symptoms: Secondary | ICD-10-CM | POA: Diagnosis not present

## 2021-04-04 DIAGNOSIS — Z91041 Radiographic dye allergy status: Secondary | ICD-10-CM | POA: Diagnosis not present

## 2021-04-04 DIAGNOSIS — E7800 Pure hypercholesterolemia, unspecified: Secondary | ICD-10-CM | POA: Diagnosis present

## 2021-04-04 DIAGNOSIS — I248 Other forms of acute ischemic heart disease: Secondary | ICD-10-CM | POA: Diagnosis present

## 2021-04-04 DIAGNOSIS — R600 Localized edema: Secondary | ICD-10-CM | POA: Diagnosis not present

## 2021-04-04 LAB — HEPATIC FUNCTION PANEL
ALT: 30 U/L (ref 0–44)
AST: 14 U/L — ABNORMAL LOW (ref 15–41)
Albumin: 2.9 g/dL — ABNORMAL LOW (ref 3.5–5.0)
Alkaline Phosphatase: 73 U/L (ref 38–126)
Bilirubin, Direct: 0.1 mg/dL (ref 0.0–0.2)
Indirect Bilirubin: 0.8 mg/dL (ref 0.3–0.9)
Total Bilirubin: 0.9 mg/dL (ref 0.3–1.2)
Total Protein: 5 g/dL — ABNORMAL LOW (ref 6.5–8.1)

## 2021-04-04 LAB — TROPONIN I (HIGH SENSITIVITY)
Troponin I (High Sensitivity): 263 ng/L (ref <18)
Troponin I (High Sensitivity): 255 ng/L (ref <18)
Troponin I (High Sensitivity): 256 ng/L (ref <18)

## 2021-04-04 LAB — RETIC PANEL
Immature Retic Fract: 4.4 % (ref 2.3–15.9)
RBC.: 4.07 MIL/uL — ABNORMAL LOW (ref 4.22–5.81)
Retic Count, Absolute: 39.9 10*3/uL (ref 19.0–186.0)
Retic Ct Pct: 1 % (ref 0.4–3.1)
Reticulocyte Hemoglobin: 35 pg (ref >27.9)

## 2021-04-04 LAB — CBC WITH DIFFERENTIAL/PLATELET
Abs Immature Granulocytes: 0.07 10*3/uL (ref 0.00–0.07)
Basophils Absolute: 0 10*3/uL (ref 0.0–0.1)
Basophils Relative: 0 %
Eosinophils Absolute: 0 10*3/uL (ref 0.0–0.5)
Eosinophils Relative: 0 %
HCT: 35.9 % — ABNORMAL LOW (ref 39.0–52.0)
Hemoglobin: 11.9 g/dL — ABNORMAL LOW (ref 13.0–17.0)
Immature Granulocytes: 1 %
Lymphocytes Relative: 16 %
Lymphs Abs: 1.9 10*3/uL (ref 0.7–4.0)
MCH: 29.7 pg (ref 26.0–34.0)
MCHC: 33.1 g/dL (ref 30.0–36.0)
MCV: 89.5 fL (ref 80.0–100.0)
Monocytes Absolute: 1.3 10*3/uL — ABNORMAL HIGH (ref 0.1–1.0)
Monocytes Relative: 11 %
Neutro Abs: 8.5 10*3/uL — ABNORMAL HIGH (ref 1.7–7.7)
Neutrophils Relative %: 72 %
Platelets: 115 10*3/uL — ABNORMAL LOW (ref 150–400)
RBC: 4.01 MIL/uL — ABNORMAL LOW (ref 4.22–5.81)
RDW: 14.1 % (ref 11.5–15.5)
WBC: 11.8 10*3/uL — ABNORMAL HIGH (ref 4.0–10.5)
nRBC: 0 % (ref 0.0–0.2)

## 2021-04-04 LAB — PROTIME-INR
INR: 1.1 (ref 0.8–1.2)
Prothrombin Time: 14.6 seconds (ref 11.4–15.2)

## 2021-04-04 LAB — BRAIN NATRIURETIC PEPTIDE: B Natriuretic Peptide: 761.1 pg/mL — ABNORMAL HIGH (ref 0.0–100.0)

## 2021-04-04 LAB — RESP PANEL BY RT-PCR (FLU A&B, COVID) ARPGX2
Influenza A by PCR: NEGATIVE
Influenza B by PCR: NEGATIVE
SARS Coronavirus 2 by RT PCR: NEGATIVE

## 2021-04-04 LAB — ABO/RH: ABO/RH(D): O POS

## 2021-04-04 LAB — TSH: TSH: 0.598 u[IU]/mL (ref 0.350–4.500)

## 2021-04-04 LAB — MAGNESIUM: Magnesium: 1.7 mg/dL (ref 1.7–2.4)

## 2021-04-04 LAB — LACTATE DEHYDROGENASE: LDH: 201 U/L — ABNORMAL HIGH (ref 98–192)

## 2021-04-04 LAB — IMMATURE PLATELET FRACTION: Immature Platelet Fraction: 11.9 % — ABNORMAL HIGH (ref 1.2–8.6)

## 2021-04-04 LAB — APTT: aPTT: 25 seconds (ref 24–36)

## 2021-04-04 LAB — PREPARE RBC (CROSSMATCH)

## 2021-04-04 LAB — LIPASE, BLOOD: Lipase: 42 U/L (ref 11–51)

## 2021-04-04 LAB — PATHOLOGIST SMEAR REVIEW

## 2021-04-04 MED ORDER — ISOSORBIDE MONONITRATE ER 30 MG PO TB24
30.0000 mg | ORAL_TABLET | Freq: Every day | ORAL | Status: DC
  Administered 2021-04-04 – 2021-04-09 (×6): 30 mg via ORAL
  Filled 2021-04-04 (×6): qty 1

## 2021-04-04 MED ORDER — VITAMIN B-12 1000 MCG PO TABS
1000.0000 ug | ORAL_TABLET | Freq: Every day | ORAL | Status: DC
  Administered 2021-04-04 – 2021-04-09 (×6): 1000 ug via ORAL
  Filled 2021-04-04 (×7): qty 1

## 2021-04-04 MED ORDER — ATORVASTATIN CALCIUM 20 MG PO TABS
40.0000 mg | ORAL_TABLET | Freq: Every day | ORAL | Status: DC
  Administered 2021-04-04 – 2021-04-08 (×5): 40 mg via ORAL
  Filled 2021-04-04 (×5): qty 2

## 2021-04-04 MED ORDER — AMLODIPINE BESYLATE 10 MG PO TABS
10.0000 mg | ORAL_TABLET | Freq: Every day | ORAL | Status: DC
  Administered 2021-04-04 – 2021-04-08 (×5): 10 mg via ORAL
  Filled 2021-04-04: qty 2
  Filled 2021-04-04 (×3): qty 1
  Filled 2021-04-04: qty 2

## 2021-04-04 MED ORDER — TAMSULOSIN HCL 0.4 MG PO CAPS
0.4000 mg | ORAL_CAPSULE | Freq: Every day | ORAL | Status: DC
  Administered 2021-04-04 – 2021-04-09 (×6): 0.4 mg via ORAL
  Filled 2021-04-04 (×6): qty 1

## 2021-04-04 MED ORDER — SODIUM CHLORIDE 0.9% FLUSH
3.0000 mL | Freq: Two times a day (BID) | INTRAVENOUS | Status: DC
  Administered 2021-04-04 – 2021-04-09 (×11): 3 mL via INTRAVENOUS

## 2021-04-04 MED ORDER — SODIUM CHLORIDE 0.9% FLUSH: 3.0000 mL | INTRAVENOUS | Status: DC | PRN

## 2021-04-04 MED ORDER — FUROSEMIDE 10 MG/ML IJ SOLN
40.0000 mg | Freq: Once | INTRAMUSCULAR | Status: AC
  Administered 2021-04-04: 09:00:00 40 mg via INTRAVENOUS
  Filled 2021-04-04: qty 4

## 2021-04-04 MED ORDER — HEPARIN BOLUS VIA INFUSION
4000.0000 [IU] | Freq: Once | INTRAVENOUS | Status: AC
  Administered 2021-04-04: 01:00:00 4000 [IU] via INTRAVENOUS
  Filled 2021-04-04: qty 4000

## 2021-04-04 MED ORDER — ACETAMINOPHEN 325 MG PO TABS: 650.0000 mg | ORAL_TABLET | Freq: Four times a day (QID) | ORAL | Status: DC | PRN

## 2021-04-04 MED ORDER — SODIUM CHLORIDE 0.9 % IV SOLN
10.0000 mL/h | Freq: Once | INTRAVENOUS | Status: AC
  Administered 2021-04-04: 22:00:00 10 mL/h via INTRAVENOUS

## 2021-04-04 MED ORDER — ONDANSETRON HCL 4 MG/2ML IJ SOLN: 4.0000 mg | Freq: Four times a day (QID) | INTRAMUSCULAR | Status: DC | PRN

## 2021-04-04 MED ORDER — HYDRALAZINE HCL 20 MG/ML IJ SOLN
10.0000 mg | Freq: Four times a day (QID) | INTRAMUSCULAR | Status: DC | PRN
  Administered 2021-04-04 – 2021-04-05 (×3): 10 mg via INTRAVENOUS
  Filled 2021-04-04 (×3): qty 1

## 2021-04-04 MED ORDER — MAGNESIUM OXIDE -MG SUPPLEMENT 400 (240 MG) MG PO TABS
800.0000 mg | ORAL_TABLET | Freq: Once | ORAL | Status: AC
  Administered 2021-04-04: 11:00:00 800 mg via ORAL
  Filled 2021-04-04: qty 2

## 2021-04-04 MED ORDER — CARVEDILOL 6.25 MG PO TABS
6.2500 mg | ORAL_TABLET | Freq: Two times a day (BID) | ORAL | Status: DC
  Administered 2021-04-04 – 2021-04-09 (×10): 6.25 mg via ORAL
  Filled 2021-04-04 (×10): qty 1

## 2021-04-04 MED ORDER — ACETAMINOPHEN 650 MG RE SUPP
650.0000 mg | Freq: Four times a day (QID) | RECTAL | Status: DC | PRN
  Filled 2021-04-04: qty 1

## 2021-04-04 MED ORDER — PANTOPRAZOLE SODIUM 40 MG PO TBEC
40.0000 mg | DELAYED_RELEASE_TABLET | Freq: Every day | ORAL | Status: DC
  Administered 2021-04-04 – 2021-04-09 (×6): 40 mg via ORAL
  Filled 2021-04-04 (×6): qty 1

## 2021-04-04 MED ORDER — SODIUM CHLORIDE 0.9 % IV SOLN: 250.0000 mL | INTRAVENOUS | Status: DC | PRN

## 2021-04-04 MED ORDER — FUROSEMIDE 10 MG/ML IJ SOLN
40.0000 mg | Freq: Once | INTRAMUSCULAR | Status: AC
  Administered 2021-04-04: 01:00:00 40 mg via INTRAVENOUS
  Filled 2021-04-04: qty 4

## 2021-04-04 MED ORDER — FINASTERIDE 5 MG PO TABS
5.0000 mg | ORAL_TABLET | Freq: Every day | ORAL | Status: DC
  Administered 2021-04-04 – 2021-04-09 (×6): 5 mg via ORAL
  Filled 2021-04-04 (×7): qty 1

## 2021-04-04 MED ORDER — ENOXAPARIN SODIUM 30 MG/0.3ML IJ SOSY
30.0000 mg | PREFILLED_SYRINGE | INTRAMUSCULAR | Status: DC
  Administered 2021-04-05 – 2021-04-09 (×5): 30 mg via SUBCUTANEOUS
  Filled 2021-04-04 (×5): qty 0.3

## 2021-04-04 MED ORDER — HEPARIN (PORCINE) 25000 UT/250ML-% IV SOLN
1300.0000 [IU]/h | INTRAVENOUS | Status: DC
  Administered 2021-04-04: 01:00:00 1300 [IU]/h via INTRAVENOUS
  Filled 2021-04-04: qty 250

## 2021-04-04 MED ORDER — ONDANSETRON HCL 4 MG PO TABS: 4.0000 mg | ORAL_TABLET | Freq: Four times a day (QID) | ORAL | Status: DC | PRN

## 2021-04-04 NOTE — ED Notes (Signed)
US at bedside

## 2021-04-04 NOTE — ED Notes (Signed)
Patient moved to hospital bed for comfort 

## 2021-04-04 NOTE — ED Provider Notes (Signed)
[facility] Emergency Department Provider Note  ____________________________________________  Time seen: Approximately 12:04 AM  I have reviewed the triage vital signs and the nursing notes.   HISTORY  Chief Complaint Shortness of Breath    HPI Peter [name] is a 80 y.o. male with a history of CHF, hypertension, NSTEMI, aortic aneurysm status post stent graft repair who comes ED complaining of worsening shortness of breath for the past month.  12 pound weight gain over the last 2 or 3 days.  Shortness of breath is worse with exertion, worse with lying down.  Denies chest pain.  No fever cough vomiting or diarrhea.  Denies black or bloody stool.  Symptoms are constant, no alleviating factors, worsening.  Reports compliance with his medications and usual diet plan.  Cardiologist is Dr. Clayborn Bigness  Past Medical History:  Diagnosis Date   Anginal pain ([place])    Asthma    Bladder infection, acute 03/2011   "had a whole lot of bleeding from this"   CHF (congestive heart failure) ([place])    Coronary artery disease    High cholesterol    Hypertension    Macular degeneration 12/14/2011   "had it in my right; getting shots now in my left"   Myocardial infarction [facility]) 1996   NSTEMI (non-ST elevated myocardial infarction) ([place]) 12/14/2011   Shortness of breath 12/14/2011   "@ rest, lying down, w/exertion"     Patient Active Problem List   Diagnosis Date Noted   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 12/19/2018   CKD (chronic kidney disease) stage 3, GFR 30-59 ml/min (HCC) 05/18/2018   Elevated hemoglobin A1c 05/18/2018   History of skin cancer 05/18/2018   Ischemic cardiomyopathy 12/16/2017   S/P ICD (internal cardiac defibrillator) procedure 12/16/2017   Congestive heart failure ([place]) 01/17/2015   Current tobacco use 01/17/2015   Hypercholesterolemia 01/17/2015   Peripheral vascular disease ([place])  01/17/2015   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   Arthritis, degenerative 07/22/2013   H/O angina pectoris 07/22/2013   Adiposity 07/22/2013   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   Hypokalemia 12/15/2011   SOB (shortness of breath) 12/14/2011   CAD (coronary artery disease) 12/14/2011   HTN (hypertension) 12/14/2011   Hyperlipidemia 12/14/2011   GERD (gastroesophageal reflux disease) 12/14/2011   BPH (benign prostatic hyperplasia) 12/14/2011   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/14/2011   Aneurysm of thoracic aorta 12/14/2011     Past Surgical History:  Procedure Laterality Date   CORONARY ANGIOPLASTY WITH [place]; ~ 2003; ~ 2008   "total of 3"   CORONARY STENT INTERVENTION N/A 08/10/2016   Procedure: Coronary Stent Intervention;  Surgeon: [name], MD;  Location: [facility];  Service: Cardiovascular;  Laterality: N/A;   HERNIA REPAIR  ~ 2001   "abdominal w/mesh implanted"   LEFT HEART CATH AND CORONARY ANGIOGRAPHY N/A 08/10/2016   Procedure: Left Heart Cath and Coronary Angiography;  Surgeon: [name], MD;  Location: [facility];  Service: Cardiovascular;  Laterality: N/A;   LEFT HEART CATHETERIZATION WITH CORONARY ANGIOGRAM N/A 12/16/2011   Procedure: LEFT HEART CATHETERIZATION WITH CORONARY ANGIOGRAM;  Surgeon: Minus Breeding, MD;  Location[facility] C[facility];  Service: Cardiovascular;  Laterality: N/A;   PERCUTANEOUS CORONARY STENT INTERVENTION (PCI-S) N/A 12/17/2011   Procedure: PERCUTANEOUS CORONARY STENT INTERVENTION (PCI-S);  Surgeon: Sherren Mocha, MD;  Location: [facility] CATH LAB;  Service: Cardiovascular;  Laterality: N/A;   TONSILLECTOMY     "I was a  kid"     Prior to Admission medications   Medication Sig Start Date End Date Taking? Authorizing Provider  amLODipine (NORVASC) 10 MG tablet Take 10 mg by mouth daily. 11/13/19   [provider]  amLODipine-benazepril  (LOTREL) 10-40 MG capsule TAKE 1 CAPSULE BY MOUTH EVERY DAY 10/21/15   [provider]  atorvastatin (LIPITOR) 40 MG tablet Take 1 tablet (40 mg total) by mouth daily at 6 PM. 12/18/11   Annita Brod, MD  carvedilol (COREG) 3.125 MG tablet Take 3.125 mg by mouth 2 (two) times daily with a meal.    [provider]  cetirizine (ZYRTEC) 10 MG tablet Take 10 mg by mouth.    [provider]  clobetasol cream (TEMOVATE) 0.05 % APPLY TO PSORIASIS BID PRN UNTIL SMOOTH 11/05/14   [provider]  clopidogrel (PLAVIX) 75 MG tablet TAKE 1 TABLET(75 MG) BY MOUTH EVERY DAY 08/11/16   Fritzi Mandes, MD  cyanocobalamin 1000 MCG tablet Take 1,000 mcg by mouth daily.     [provider]  finasteride (PROSCAR) 5 MG tablet Take 1 tablet (5 mg total) by mouth daily. 02/22/20   Billey Co, MD  GLUCOSAMINE-CHONDROITIN-VIT C PO Take 1 tablet by mouth daily.    [provider]  isosorbide mononitrate (IMDUR) 30 MG 24 hr tablet Take 30 mg by mouth daily. 06/18/16   [provider]  losartan (COZAAR) 100 MG tablet Take 100 mg by mouth daily. 11/13/19   [provider]  omeprazole (PRILOSEC) 20 MG capsule Take by mouth. 01/16/16   [provider]  spironolactone (ALDACTONE) 25 MG tablet SMARTSIG:0.25 Tablet(s) By Mouth Daily 10/12/19   [provider]  tamsulosin (FLOMAX) 0.4 MG CAPS capsule Take 1 capsule (0.4 mg total) by mouth daily. 02/22/20   Billey Co, MD     Allergies Iodinated diagnostic agents and Iodinated glycerol  [glycerol, iodinated]   Family History  Family history unknown: Yes    Social History Social History   Tobacco Use   Smoking status: Former    Packs/day: 0.50    Years: 0.50    Pack years: 0.25    Types: Cigarettes   Smokeless tobacco: Never   Tobacco comments:    11/23/16 Still not ready to quit smoking.  Substance Use Topics   Alcohol use: Yes    Alcohol/week: 1.0 standard drink     Types: 1 Cans of beer per week    Comment: 12/14/2011 "if my kidneys are sore, I drink 1 beer/day; if not sore; no beer; occasionally drink socially; ave 1 beer/wk maybe"   Drug use: No    Review of Systems  Constitutional:   No fever or chills.  ENT:   No sore throat. No rhinorrhea. Cardiovascular:   No chest pain or syncope. Respiratory:   Positive shortness of breath. Gastrointestinal:   Negative for abdominal pain, vomiting and diarrhea.  Musculoskeletal:   Negative for focal pain or swelling All other systems reviewed and are negative except as documented above in ROS and HPI.  ____________________________________________   PHYSICAL EXAM:  VITAL SIGNS: ED Triage Vitals  Enc Vitals Group     BP 04/03/21 2216 (!) 155/67     Pulse Rate 04/03/21 2216 62     Resp 04/03/21 2216 20     Temp 04/03/21 2216 98.1 F (36.7 C)     Temp Source 04/03/21 2216 Oral     SpO2 04/03/21 2216 96 %     Weight 04/03/21  2221 220 lb (99.8 kg)     Height 04/03/21 2221 5\' 10"  (1.778 m)     Head Circumference --      Peak Flow --      Pain Score 04/03/21 2220 0     Pain Loc --      Pain Edu? --      Excl. in [place]? --     Vital signs reviewed, nursing assessments reviewed.   Constitutional:   Alert and oriented. Non-toxic appearance. Eyes:   Conjunctivae are normal. EOMI. PERRL. ENT      Head:   Normocephalic and atraumatic.      Nose:   Wearing a mask.      Mouth/Throat:   Wearing a mask.      Neck:   No meningismus. Full ROM. Hematological/Lymphatic/Immunilogical:   No cervical lymphadenopathy. Cardiovascular:   RRR. Symmetric bilateral radial and DP pulses.  No murmurs. Cap refill less than 2 seconds. Respiratory:   Normal respiratory effort without tachypnea/retractions. Breath sounds are clear and equal bilaterally. No wheezes/rales/rhonchi. Gastrointestinal:   Soft and nontender. Non distended. There is no CVA tenderness.  No rebound, rigidity, or guarding.  Rectal exam reveals soft  brown stool, Hemoccult negative Genitourinary:   Edematous genitalia Musculoskeletal:   Normal range of motion in all extremities. No joint effusions.  No lower extremity tenderness.  3+ pitting edema bilateral lower extremities, symmetric calf circumference. Neurologic:   Normal speech and language.  Motor grossly intact. No acute focal neurologic deficits are appreciated.  Skin:    Skin is warm, dry and intact. No rash noted.  No petechiae, purpura, or bullae.  ____________________________________________    LABS (pertinent positives/negatives) (all labs ordered are listed, but only abnormal results are displayed) Labs Reviewed  BASIC METABOLIC PANEL - Abnormal; Notable for the following components:      Result Value   Glucose, Bld 152 (*)    BUN 67 (*)    Creatinine, Ser 2.95 (*)    Calcium 7.5 (*)    GFR, Estimated 21 (*)    Anion gap 4 (*)    All other components within normal limits  CBC - Abnormal; Notable for the following components:   RBC 1.92 (*)    Hemoglobin 5.7 (*)    HCT 18.0 (*)    Platelets 65 (*)    All other components within normal limits  TROPONIN I (HIGH SENSITIVITY) - Abnormal; Notable for the following components:   Troponin I (High Sensitivity) 262 (*)    All other components within normal limits  TROPONIN I (HIGH SENSITIVITY) - Abnormal; Notable for the following components:   Troponin I (High Sensitivity) 263 (*)    All other components within normal limits  PROTIME-INR  APTT  HEPATIC FUNCTION PANEL  LIPASE, BLOOD  MAGNESIUM  TSH  TYPE AND SCREEN  PREPARE RBC (CROSSMATCH)  ABO/RH   ____________________________________________   EKG  Interpreted by me Sinus rhythm rate of 61 left axis, left bundle branch block.  2 PVCs on the strip.  No acute ischemic changes.  ____________________________________________    [id number]  DG Chest 2 View  Result Date: 04/03/2021 CLINICAL DATA:  sob EXAM: CHEST - 2 VIEW COMPARISON:  Chest x-ray  09/07/2016, CT chest 12/14/2011. FINDINGS: The heart and mediastinal contours are unchanged. Coronary artery stent. Aortic calcification. The distal descending thoracic aorta/suprarenal abdominal aorta stent noted on lateral view. Three lead cardiac pacemaker/defibrillator overlies the left chest wall. No focal consolidation. No pulmonary edema. Blunting of bilateral  costophrenic angles. No pneumothorax. No acute osseous abnormality. IMPRESSION: Blunting of bilateral costophrenic angles. Trace pleural effusions not excluded. Electronically Signed   By: Iven Finn M.D.   On: 04/03/2021 22:56    ____________________________________________   PROCEDURES .Critical Care Performed by: Carrie Mew, MD Authorized by: Carrie Mew, MD   Critical care provider statement:    Critical care time (minutes):  35   Critical care time was exclusive of:  Separately billable procedures and treating other patients   Critical care was necessary to treat or prevent imminent or life-threatening deterioration of the following conditions:  Cardiac failure and circulatory failure   Critical care was time spent personally by me on the following activities:  Development of treatment plan with patient or surrogate, discussions with consultants, evaluation of patient's response to treatment, examination of patient, obtaining history from patient or surrogate, ordering and performing treatments and interventions, ordering and review of laboratory studies, ordering and review of radiographic studies, pulse oximetry, re-evaluation of patient's condition and review of old charts  ____________________________________________  DIFFERENTIAL DIAGNOSIS   Non-STEMI, symptomatic anemia, CHF, hypothyroidism, renal failure, electrolyte abnormality, DVT, PE  CLINICAL IMPRESSION / ASSESSMENT AND PLAN / ED COURSE  Medications ordered in the ED: Medications  0.9 %  sodium chloride infusion (has no administration in time  range)  heparin bolus via infusion 4,000 Units (has no administration in time range)  heparin ADULT infusion 100 units/mL (25000 units/244mL) (1,300 Units/hr Intravenous New Bag/Given 04/04/21 0110)  furosemide (LASIX) injection 40 mg (40 mg Intravenous Given 04/04/21 0031)    Pertinent labs & imaging results that were available during my care of the patient were reviewed by me and considered in my medical decision making (see chart for details).  Peter [name] was evaluated in Emergency Department on 04/04/2021 for the symptoms described in the history of present illness. He was evaluated in the context of the global COVID-19 pandemic, which necessitated consideration that the patient might be at risk for infection with the SARS-CoV-2 virus that causes COVID-19. Institutional protocols and algorithms that pertain to the evaluation of patients at risk for COVID-19 are in a state of rapid change based on information released by regulatory bodies including the CDC and federal and state organizations. These policies and algorithms were followed during the patient's care in the ED.   Patient presents with worsening shortness of breath, weight gain and pronounced pitting edema.  Most suggestive of volume overload and CHF exacerbation.  Labs show creatinine of 2.9 compared to her recent baseline of 2.3 from labs seen in [place].  His hemoglobin level is 5.7 compared to a baseline of about 12.  Additionally platelets are low at 65.  Troponin elevated at 260, unchanged with repeat.  This may be his chronic baseline, versus related to CHF.  We will treat with IV heparin for now, IV Lasix, blood transfusion to restore oxygen carrying capacity and plan to admit for further management.  Clinical Course as of 04/04/21 0522  Fri Apr 04, 2021  0451 Unit division: 00 [PS]    Clinical Course User Index [PS] Carrie Mew, MD     ____________________________________________   FINAL CLINICAL  IMPRESSION(S) / ED DIAGNOSES    Final diagnoses:  Acute on chronic systolic congestive heart failure ([place])  Symptomatic anemia     ED Discharge Orders     None       Portions of this note were generated with dragon dictation software. Dictation errors may occur despite best  attempts at proofreading.    Carrie Mew, MD 04/04/21 [phone]

## 2021-04-04 NOTE — H&P (Addendum)
History and Physical    Peter [name] SWH:[id number] DOB: 09/21/1940 DOA: 04/03/2021  PCP: Tracie Harrier, MD   Patient coming from: Home  I have personally briefly reviewed patient's old medical records in [place]  Chief Complaint: Shortness of breath  HPI: Peter [name] is a 80 y.o. male with medical history significant for coronary artery disease status post PCI with stent angioplasty, hypertension, anemia of chronic disease, chronic diastolic dysfunction CHF who presents to the ER for evaluation of several weeks of shortness of breath associated with bilateral lower extremity swelling. Patient states that he had a respiratory infection around Thanksgiving from RSV and influenza and has had difficulty breathing since then.  He notes bilateral lower extremity swelling and insomnia which he attributes to not being able to breathe when he lays down.  He denies having any chest pain and denies having any palpitations or diaphoresis.  He denies having any abdominal pain, no hematemesis, no melena, no nausea, no hematochezia, no dizziness, no lightheadedness, no falls, no fever, no chills, no cough. He takes aspirin occasionally for pain. Sodium 138, potassium 4.6, chloride 111, bicarb 23, glucose 152, BUN 67, creatinine 2.95 above a baseline of 2.3, calcium 7.5, magnesium 1.7, alkaline phosphatase 73, albumin 2.9, lipase 42, AST 14, ALT 30, total protein 5.0, BNP 761, troponin 263, PT 14.6, INR 1.1, white count 4.9, hemoglobin 5.7 down from 11.5 IN 09/22, hematocrit 18, MCV 93.8, RDW 14.1, platelet count 65 Respiratory viral panel is negative CT scan of abdomen and pelvis shows no acute findings in the abdomen or pelvis to suggest retroperitoneal hemorrhage.  Cholelithiasis.  Status post aortic endograft placement. Chest x-ray reviewed by me shows trace pleural effusions. Lower extremity venous Dopplers negative for DVT Twelve-lead EKG reviewed by me shows sinus rhythm with  frequent PVCs and PACs.  Left bundle branch block   ED Course: Patient is an 80 year old male who presents to the ER for evaluation of shortness of breath mostly with exertion since Thanksgiving associated with orthopnea and bilateral lower extremity swelling. Labs reveal pancytopenia with marked anemia, hemoglobin is down to 5.7 from a baseline of 11.5 and platelet count of 65. His troponin is also elevated and he has worsening of his renal function from baseline.  Stool is heme-negative He was transfused 2 units of packed RBC in the ER. He will be admitted to the hospital for further evaluation.   Review of Systems: As per HPI otherwise all other systems reviewed and negative.    Past Medical History:  Diagnosis Date   Anginal pain ([place])    Asthma    Bladder infection, acute 03/2011   "had a whole lot of bleeding from this"   CHF (congestive heart failure) ([place])    Coronary artery disease    High cholesterol    Hypertension    Macular degeneration 12/14/2011   "had it in my right; getting shots now in my left"   Myocardial infarction [facility]) 1996   NSTEMI (non-ST elevated myocardial infarction) ([place]) 12/14/2011   Shortness of breath 12/14/2011   "@ rest, lying down, w/exertion"    Past Surgical History:  Procedure Laterality Date   [place]; ~ 2003; ~ 2008   "total of 3"   CORONARY STENT INTERVENTION N/A 08/10/2016   Procedure: Coronary Stent Intervention;  Surgeon: [name], MD;  Location: [place] [facility];  Service: Cardiovascular;  Laterality: N/A;   HERNIA REPAIR  ~ 2001   "abdominal  w/mesh implanted"   LEFT HEART CATH AND CORONARY ANGIOGRAPHY N/A 08/10/2016   Procedure: Left Heart Cath and Coronary Angiography;  Surgeon: [name], MD;  Location: [place] [facility];  Service: Cardiovascular;  Laterality: N/A;   LEFT HEART CATHETERIZATION WITH CORONARY ANGIOGRAM N/A 12/16/2011   Procedure: LEFT HEART CATHETERIZATION  WITH CORONARY ANGIOGRAM;  Surgeon: Minus Breeding, MD;  Location[facility] CATH LAB;  Service: Cardiovascular;  Laterality: N/A;   PERCUTANEOUS CORONARY STENT INTERVENTION (PCI-S) N/A 12/17/2011   Procedure: PERCUTANEOUS CORONARY STENT INTERVENTION (PCI-S);  Surgeon: Sherren Mocha, MD;  Location: [facility] CATH LAB;  Service: Cardiovascular;  Laterality: N/A;   TONSILLECTOMY     "I was a kid"     reports that he has quit smoking. His smoking use included cigarettes. He has a 0.25 pack-year smoking history. He has never used smokeless tobacco. He reports current alcohol use of about 1.0 standard drink per week. He reports that he does not use drugs.  Allergies  Allergen Reactions   Iodinated Diagnostic Agents Shortness Of Breath   Iodinated Glycerol  [Glycerol, Iodinated] Shortness Of Breath    Family History  Family history unknown: Yes      Prior to Admission medications   Medication Sig Start Date End Date Taking? Authorizing Provider  amLODipine (NORVASC) 10 MG tablet Take 10 mg by mouth daily. 11/13/19  Yes [provider]  atorvastatin (LIPITOR) 40 MG tablet Take 1 tablet (40 mg total) by mouth daily at 6 PM. 12/18/11  Yes Annita Brod, MD  carvedilol (COREG) 6.25 MG tablet Take 6.25 mg by mouth 2 (two) times daily with a meal.   Yes [provider]  clopidogrel (PLAVIX) 75 MG tablet TAKE 1 TABLET(75 MG) BY MOUTH EVERY DAY 08/11/16  Yes Fritzi Mandes, MD  furosemide (LASIX) 20 MG tablet Take 20 mg by mouth daily. 03/24/21  Yes [provider]  isosorbide mononitrate (IMDUR) 30 MG 24 hr tablet Take 30 mg by mouth daily. 06/18/16  Yes [provider]  losartan (COZAAR) 100 MG tablet Take 100 mg by mouth daily. 11/13/19  Yes [provider]  omeprazole (PRILOSEC) 20 MG capsule Take by mouth. 01/16/16  Yes [provider]  spironolactone (ALDACTONE) 25 MG tablet Take 6.25 mg by mouth daily. 10/12/19  Yes [provider]  tamsulosin (FLOMAX) 0.4  MG CAPS capsule Take 1 capsule (0.4 mg total) by mouth daily. 02/22/20  Yes Billey Co, MD  amLODipine-benazepril (LOTREL) 10-40 MG capsule TAKE 1 CAPSULE BY MOUTH EVERY DAY Patient not taking: Reported on 04/04/2021 10/21/15   [provider]  cetirizine (ZYRTEC) 10 MG tablet Take 10 mg by mouth.    [provider]  clobetasol cream (TEMOVATE) 0.05 % APPLY TO PSORIASIS BID PRN UNTIL SMOOTH 11/05/14   [provider]  cyanocobalamin 1000 MCG tablet Take 1,000 mcg by mouth daily.     [provider]  finasteride (PROSCAR) 5 MG tablet Take 1 tablet (5 mg total) by mouth daily. 02/22/20   Billey Co, MD  GLUCOSAMINE-CHONDROITIN-VIT C PO Take 1 tablet by mouth daily.    [provider]  predniSONE (DELTASONE) 10 MG tablet Take 1 tablet by mouth daily. 6, 5, 4, 3, 2, 1 03/28/21   [provider]    Physical Exam: Vitals:   04/04/21 0648 04/04/21 0848 04/04/21 0904 04/04/21 0915  BP: (!) 224/92 (!) 183/121 (!) 215/84   Pulse: 68 72 71 62  Resp: 16 20 20    Temp: Marland Kitchen)  97.5 F (36.4 C) (!) 97.5 F (36.4 C) (!) 97.4 F (36.3 C)   TempSrc: Oral Oral Axillary   SpO2: 97% 98% 97% 100%  Weight:      Height:         Vitals:   04/04/21 0648 04/04/21 0848 04/04/21 0904 04/04/21 0915  BP: (!) 224/92 (!) 183/121 (!) 215/84   Pulse: 68 72 71 62  Resp: 16 20 20    Temp: (!) 97.5 F (36.4 C) (!) 97.5 F (36.4 C) (!) 97.4 F (36.3 C)   TempSrc: Oral Oral Axillary   SpO2: 97% 98% 97% 100%  Weight:      Height:          Constitutional: Alert and oriented x 3 . Not in any apparent distress HEENT:      Head: Normocephalic and atraumatic.         Eyes: PERLA, EOMI, Conjunctivae pallor, sclera is non-icteric.       Mouth/Throat: Mucous membranes are moist.       Neck: Supple with no signs of meningismus. Cardiovascular: Regular rate and rhythm. No murmurs, gallops, or rubs. 2+ symmetrical distal pulses are present . No JVD. 3+ LE  edema Respiratory: Respiratory effort normal .bilateral air entry in both lung fields  Gastrointestinal: Soft, non tender, and non distended with positive bowel sounds.  Genitourinary: No CVA tenderness. Musculoskeletal: Nontender with normal range of motion in all extremities. No cyanosis, or erythema of extremities. Neurologic:  Face is symmetric. Moving all extremities. No gross focal neurologic deficits . Skin: Skin is warm, dry.  Ecchymosis on his extremities Psychiatric: Mood and affect are normal    Labs on Admission: I have personally reviewed following labs and imaging studies  CBC: Recent Labs  Lab 04/03/21 2223  WBC 4.9  HGB 5.7*  HCT 18.0*  MCV 93.8  PLT 65*   Basic Metabolic Panel: Recent Labs  Lab 04/03/21 2223 04/04/21 0025  NA 138  --   K 4.6  --   CL 111  --   CO2 23  --   GLUCOSE 152*  --   BUN 67*  --   CREATININE 2.95*  --   CALCIUM 7.5*  --   MG  --  1.7   GFR: Estimated Creatinine Clearance: 23.3 mL/min (A) (by C-G formula based on SCr of 2.95 mg/dL (H)). Liver Function Tests: Recent Labs  Lab 04/04/21 0025  AST 14*  ALT 30  ALKPHOS 73  BILITOT 0.9  PROT 5.0*  ALBUMIN 2.9*   Recent Labs  Lab 04/04/21 0025  LIPASE 42   No results for input(s): AMMONIA in the last 168 hours. Coagulation Profile: Recent Labs  Lab 04/04/21 0025  INR 1.1   Cardiac Enzymes: No results for input(s): CKTOTAL, CKMB, CKMBINDEX, TROPONINI in the last 168 hours. BNP (last 3 results) No results for input(s): PROBNP in the last 8760 hours. HbA1C: No results for input(s): HGBA1C in the last 72 hours. CBG: No results for input(s): GLUCAP in the last 168 hours. Lipid Profile: No results for input(s): CHOL, HDL, LDLCALC, TRIG, CHOLHDL, LDLDIRECT in the last 72 hours. Thyroid Function Tests: Recent Labs    04/04/21 0025  TSH 0.598   Anemia Panel: No results for input(s): VITAMINB12, FOLATE, FERRITIN, TIBC, IRON, RETICCTPCT in the last 72 hours. Urine  analysis:    Component Value Date/Time   COLORURINE YELLOW (A) 12/21/2017 1306   APPEARANCEUR Clear 12/07/2019 1417   LABSPEC 1.008 12/21/2017 1306   PHURINE 7.0  12/21/2017 1306   GLUCOSEU Negative 12/07/2019 1417   HGBUR NEGATIVE 12/21/2017 1306   BILIRUBINUR Negative 12/07/2019 [place] 12/21/2017 1306   PROTEINUR Negative 12/07/2019 1417   PROTEINUR NEGATIVE 12/21/2017 1306   UROBILINOGEN 0.2 04/06/2011 0900   NITRITE Negative 12/07/2019 1417   NITRITE NEGATIVE 12/21/2017 1306   LEUKOCYTESUR Negative 12/07/2019 1417    Radiological Exams on Admission: CT ABDOMEN PELVIS WO CONTRAST  Result Date: 04/04/2021 CLINICAL DATA:  Retroperitoneal hemorrhage suspected. EXAM: CT ABDOMEN AND PELVIS WITHOUT CONTRAST TECHNIQUE: Multidetector CT imaging of the abdomen and pelvis was performed following the standard protocol without IV contrast. COMPARISON:  01/29/2016 FINDINGS: Lower chest: Bibasilar atelectasis noted with tiny bilateral pleural effusions. Hepatobiliary: No focal abnormality in the liver on this study without intravenous contrast. Layering tiny gallstones evident. No intrahepatic or extrahepatic biliary dilation. Pancreas: No focal mass lesion. No dilatation of the main duct. No intraparenchymal cyst. No peripancreatic edema. Spleen: No splenomegaly. No focal mass lesion. Adrenals/Urinary Tract: No adrenal nodule or mass. Small cyst noted upper pole left kidney. No evidence for hydroureter. The urinary bladder appears normal for the degree of distention. Stomach/Bowel: Stomach is unremarkable. No gastric wall thickening. No evidence of outlet obstruction. Duodenum is normally positioned as is the ligament of Treitz. No small bowel wall thickening. No small bowel dilatation. The terminal ileum is normal. The appendix is normal. No gross colonic mass. No colonic wall thickening. Diverticular changes are noted in the left colon without evidence of diverticulitis.  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 Low-attenuation left para-aortic lymph nodes (image 32/2) are stable since 2017 consistent with benign etiology. No pelvic sidewall lymphadenopathy. Reproductive: The prostate gland and seminal vesicles are unremarkable. Other: No intraperitoneal free fluid. Musculoskeletal: Multiple supraumbilical ventral hernias identified, containing only fat. Stable bone island left anterior pubic ramus. No worrisome lytic or sclerotic osseous abnormality. IMPRESSION: 1. No acute findings in the abdomen or pelvis. Specifically, no findings to suggests retroperitoneal hemorrhage. 2. 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 3. No change in the left para-aortic lymphadenopathy consistent with benign/reactive etiology. 4. Cholelithiasis. 5. Multiple supraumbilical ventral hernias containing only fat. Electronically Signed   By: Misty Stanley M.D.   On: 04/04/2021 07:36   DG Chest 2 View  Result Date: 04/03/2021 CLINICAL DATA:  sob EXAM: CHEST - 2 VIEW COMPARISON:  Chest x-ray 09/07/2016, CT chest 12/14/2011. FINDINGS: The heart and mediastinal contours are unchanged. Coronary artery stent. Aortic calcification. The distal descending thoracic aorta/suprarenal abdominal aorta stent noted on lateral view. Three lead cardiac pacemaker/defibrillator overlies the left chest wall. No focal consolidation. No pulmonary edema. Blunting of bilateral costophrenic angles. No pneumothorax. No acute osseous abnormality. IMPRESSION: Blunting of bilateral costophrenic angles. Trace pleural effusions not excluded. Electronically Signed   By: Iven Finn M.D.   On: 04/03/2021 22:56   US Venous Img Lower Bilateral  Result Date: 04/04/2021 CLINICAL DATA:  Bilateral lower extremity edema, dyspnea EXAM: BILATERAL LOWER EXTREMITY VENOUS  DOPPLER ULTRASOUND TECHNIQUE: Gray-scale sonography with compression, as well as color and duplex ultrasound, were performed to evaluate the deep venous system(s) from the level of the common femoral vein through the popliteal and proximal calf veins. COMPARISON:  None. FINDINGS: VENOUS Normal compressibility of the common femoral, superficial femoral, and popliteal veins, as well as the visualized calf veins. Visualized portions of profunda femoral vein and  great saphenous vein unremarkable. No filling defects to suggest DVT on grayscale or color Doppler imaging. Doppler waveforms show normal direction of venous flow, normal respiratory plasticity and response to augmentation. OTHER None. Limitations: none IMPRESSION: Negative. Electronically Signed   By: Fidela Salisbury M.D.   On: 04/04/2021 03:32     Assessment/Plan Principal Problem:   Pancytopenia ([place]) Active Problems:   CAD (coronary artery disease)   Benign prostatic hyperplasia with urinary obstruction   S/P ICD (internal cardiac defibrillator) procedure   CKD (chronic kidney disease) stage 4, GFR 15-29 ml/min (HCC)   S/P AAA (abdominal aortic aneurysm) repair   Acute anemia   AKI (acute kidney injury) [facility])     Patient is an 80 year old male who presents to the ER for evaluation of worsening shortness of breath associated with bilateral lower extremity swelling     Pancytopenia Patient has severe anemia with hemoglobin of 5.7 down from a baseline of 11.5 in 09/22. Stools are heme-negative He also has severe thrombocytopenia concerning for possible TTP in the setting of anemia and worsening renal function. Will obtain peripheral smear to evaluate for schistocytes Get LDH levels Consult hematology Patient has been transfused 2 units of packed RBC     Elevated troponin/history of coronary artery disease Patient has elevated troponin levels most likely secondary to demand ischemia from anemia.  He denies having any chest  pain We will cycle cardiac enzymes Hold Plavix due to anemia requiring blood transfusion Continue statins, nitrates and carvedilol Obtain 2D echocardiogram to assess LVEF and rule out regional wall motion abnormality Consult cardiology    Chronic diastolic dysfunction CHF Last known LVEF was 50% from 09/22 Hold furosemide, spironolactone and Cozaar due to worsening renal function Continue carvedilol and amlodipine to optimize blood pressure control    Acute worsening of stage III chronic kidney disease At baseline patient has a serum creatinine of 2.3 but today on admission it is 2.95 Hold furosemide, spironolactone and Cozaar Monitor renal function closely     BPH Continue Flomax and finasteride    Hypertension Continue amlodipine and carvedilol  DVT prophylaxis: SCD  Code Status: full code  Family Communication: Greater than 50% of time was spent discussing patient's condition and plan of care with him at the bedside.  All questions and concerns have been addressed.  He verbalizes understanding and agrees to the plan.  He lists his daughter [name] as his healthcare power of attorney and wishes to be a full code at this time. Disposition Plan: Back to previous home environment Consults called: Hematology/cardiology Status:At the time of admission, it appears that the appropriate admission status for this patient is inpatient. This is judged to be reasonable and necessary to provide the required intensity of service to ensure the patient's safety given the presenting symptoms, physical exam findings, and initial radiographic and laboratory data in the context of their comorbid conditions. Patient requires inpatient status due to high intensity of service, high risk for further deterioration and high frequency of surveillance required.     Collier Bullock MD [facility]     04/04/2021, 9:24 AM

## 2021-04-04 NOTE — Consult Note (Signed)
CARDIOLOGY CONSULT NOTE               Patient ID: Peter [name] MRN: [id number] DOB/AGE: 1941/02/12 80 y.o.  Admit date: 04/03/2021 Referring Physician Dr. Marthenia Rolling hospitalist Primary Physician Hande primary Primary Cardiologist [facility] Reason for Consultation shortness of breath congestive heart failure  HPI: Patient 80 year old male multivessel coronary status post PCI and AICD for heart failure AAA repair endograft non-STEMI COPD previous smoking obesity obstructive sleep apnea.  Patient states he recently developed upper respiratory infection probably related to RSV or influenza requiring significant treatment he has gotten progressively worse had more shortness of breath came in and was referred cardiology for shortness of breath dyspnea patient was found to be profoundly anemic hemoglobin of less than 6 with no active bleeding.  Denies any chest pain called rescue to come and was given diuretic therapy which seemed to improve his symptoms.  He complains of lower extremity lower body and scrotal swelling gotten progressively worse with gain of family weight quickly needed aggressive medical therapy  Review of systems complete and found to be negative unless listed above     Past Medical History:  Diagnosis Date   Anginal pain ([place])    Asthma    Bladder infection, acute 03/2011   "had a whole lot of bleeding from this"   CHF (congestive heart failure) ([place])    Coronary artery disease    High cholesterol    Hypertension    Macular degeneration 12/14/2011   "had it in my right; getting shots now in my left"   Myocardial infarction [facility]) 1996   NSTEMI (non-ST elevated myocardial infarction) ([place]) 12/14/2011   Shortness of breath 12/14/2011   "@ rest, lying down, w/exertion"    Past Surgical History:  Procedure Laterality Date   [place]; ~ 2003; ~ 2008   "total of 3"   CORONARY STENT INTERVENTION N/A 08/10/2016   Procedure: Coronary  Stent Intervention;  Surgeon: [name], MD;  Location: [facility];  Service: Cardiovascular;  Laterality: N/A;   HERNIA REPAIR  ~ 2001   "abdominal w/mesh implanted"   LEFT HEART CATH AND CORONARY ANGIOGRAPHY N/A 08/10/2016   Procedure: Left Heart Cath and Coronary Angiography;  Surgeon: [name], MD;  Location: [facility];  Service: Cardiovascular;  Laterality: N/A;   LEFT HEART CATHETERIZATION WITH CORONARY ANGIOGRAM N/A 12/16/2011   Procedure: LEFT HEART CATHETERIZATION WITH CORONARY ANGIOGRAM;  Surgeon: Minus Breeding, MD;  Location: [facility] CATH LAB;  Service: Cardiovascular;  Laterality: N/A;   PERCUTANEOUS CORONARY STENT INTERVENTION (PCI-S) N/A 12/17/2011   Procedure: PERCUTANEOUS CORONARY STENT INTERVENTION (PCI-S);  Surgeon: Sherren Mocha, MD;  Location: [facility] CATH LAB;  Service: Cardiovascular;  Laterality: N/A;   TONSILLECTOMY     "I was a kid"    (Not in a hospital admission)  Social History   Socioeconomic History   Marital status: Widowed    Spouse name: Not on file   Number of children: Not on file   Years of education: Not on file   Highest education level: Not on file  Occupational History   Not on file  Tobacco Use   Smoking status: Former    Packs/day: 0.50    Years: 0.50    Pack years: 0.25    Types: Cigarettes   Smokeless tobacco: Never   Tobacco comments:    11/23/16 Still not ready to quit smoking.  Substance and Sexual Activity   Alcohol use: Yes  Alcohol/week: 1.0 standard drink    Types: 1 Cans of beer per week    Comment: 12/14/2011 "if my kidneys are sore, I drink 1 beer/day; if not sore; no beer; occasionally drink socially; ave 1 beer/wk maybe"   Drug use: No   Sexual activity: Not Currently  Other Topics Concern   Not on file  Social History Narrative   Not on file   Social Determinants of Health   Financial Resource Strain: Not on file  Food Insecurity: Not on file  Transportation Needs: Not on file   Physical Activity: Not on file  Stress: Not on file  Social Connections: Not on file  Intimate Partner Violence: Not on file    Family History  Family history unknown: Yes      Review of systems complete and found to be negative unless listed above      PHYSICAL EXAM  General: Well developed, well nourished, in no acute distress HEENT:  Normocephalic and atramatic Neck:  No JVD.  Lungs: Clear bilaterally to auscultation and percussion. Heart: HRRR . Normal S1 and S2 without gallops or murmurs.  Abdomen: Bowel sounds are positive, abdomen soft and non-tender  Msk:  Back normal, normal gait. Normal strength and tone for age. Extremities: No clubbing, cyanosis or edema.   Neuro: Alert and oriented X 3. Psych:  Good affect, responds appropriately  Labs:   Lab Results  Component Value Date   WBC 11.8 (H) 04/04/2021   HGB 11.9 (L) 04/04/2021   HCT 35.9 (L) 04/04/2021   MCV 89.5 04/04/2021   PLT 115 (L) 04/04/2021    Recent Labs  Lab 04/03/21 2223 04/04/21 0025  NA 138  --   K 4.6  --   CL 111  --   CO2 23  --   BUN 67*  --   CREATININE 2.95*  --   CALCIUM 7.5*  --   PROT  --  5.0*  BILITOT  --  0.9  ALKPHOS  --  73  ALT  --  30  AST  --  14*  GLUCOSE 152*  --    Lab Results  Component Value Date   CKTOTAL 23 (L) 12/11/2013   CKMB 1.0 12/11/2013   TROPONINI 0.05 (HH) 12/21/2017    Lab Results  Component Value Date   CHOL 108 08/08/2016   CHOL 141 12/16/2011   Lab Results  Component Value Date   HDL 36 (L) 08/08/2016   HDL 38 (L) 12/16/2011   Lab Results  Component Value Date   LDLCALC 50 08/08/2016   LDLCALC 83 12/16/2011   Lab Results  Component Value Date   TRIG 111 08/08/2016   TRIG 102 12/16/2011   Lab Results  Component Value Date   CHOLHDL 3.0 08/08/2016   CHOLHDL 3.7 12/16/2011   No results found for: LDLDIRECT    Radiology: CT ABDOMEN PELVIS WO CONTRAST  Result Date: 04/04/2021 CLINICAL DATA:  Retroperitoneal hemorrhage  suspected. EXAM: CT ABDOMEN AND PELVIS WITHOUT CONTRAST TECHNIQUE: Multidetector CT imaging of the abdomen and pelvis was performed following the standard protocol without IV contrast. COMPARISON:  01/29/2016 FINDINGS: Lower chest: Bibasilar atelectasis noted with tiny bilateral pleural effusions. Hepatobiliary: No focal abnormality in the liver on this study without intravenous contrast. Layering tiny gallstones evident. No intrahepatic or extrahepatic biliary dilation. Pancreas: No focal mass lesion. No dilatation of the main duct. No intraparenchymal cyst. No peripancreatic edema. Spleen: No splenomegaly. No focal mass lesion. Adrenals/Urinary Tract: No adrenal nodule or  mass. Small cyst noted upper pole left kidney. No evidence for hydroureter. The urinary bladder appears normal for the degree of distention. Stomach/Bowel: Stomach is unremarkable. No gastric wall thickening. No evidence of outlet obstruction. Duodenum is normally positioned as is the ligament of Treitz. No small bowel wall thickening. No small bowel dilatation. The terminal ileum is normal. The appendix is normal. No gross colonic mass. No colonic wall thickening. Diverticular changes are noted in the left colon without evidence of diverticulitis. 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 Low-attenuation left para-aortic lymph nodes (image 32/2) are stable since 2017 consistent with benign etiology. No pelvic sidewall lymphadenopathy. Reproductive: The prostate gland and seminal vesicles are unremarkable. Other: No intraperitoneal free fluid. Musculoskeletal: Multiple supraumbilical ventral hernias identified, containing only fat. Stable bone island left anterior pubic ramus. No worrisome lytic or sclerotic osseous abnormality. IMPRESSION: 1. No acute findings in the abdomen or pelvis. Specifically, no findings to suggests retroperitoneal hemorrhage. 2.  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 3. No change in the left para-aortic lymphadenopathy consistent with benign/reactive etiology. 4. Cholelithiasis. 5. Multiple supraumbilical ventral hernias containing only fat. Electronically Signed   By: Misty Stanley M.D.   On: 04/04/2021 07:36   DG Chest 2 View  Result Date: 04/03/2021 CLINICAL DATA:  sob EXAM: CHEST - 2 VIEW COMPARISON:  Chest x-ray 09/07/2016, CT chest 12/14/2011. FINDINGS: The heart and mediastinal contours are unchanged. Coronary artery stent. Aortic calcification. The distal descending thoracic aorta/suprarenal abdominal aorta stent noted on lateral view. Three lead cardiac pacemaker/defibrillator overlies the left chest wall. No focal consolidation. No pulmonary edema. Blunting of bilateral costophrenic angles. No pneumothorax. No acute osseous abnormality. IMPRESSION: Blunting of bilateral costophrenic angles. Trace pleural effusions not excluded. Electronically Signed   By: Iven Finn M.D.   On: 04/03/2021 22:56   US Venous Img Lower Bilateral  Result Date: 04/04/2021 CLINICAL DATA:  Bilateral lower extremity edema, dyspnea EXAM: BILATERAL LOWER EXTREMITY VENOUS DOPPLER ULTRASOUND TECHNIQUE: Gray-scale sonography with compression, as well as color and duplex ultrasound, were performed to evaluate the deep venous system(s) from the level of the common femoral vein through the popliteal and proximal calf veins. COMPARISON:  None. FINDINGS: VENOUS Normal compressibility of the common femoral, superficial femoral, and popliteal veins, as well as the visualized calf veins. Visualized portions of profunda femoral vein and great saphenous vein unremarkable. No filling defects to suggest DVT on grayscale or color Doppler imaging. Doppler waveforms show normal direction of venous flow, normal respiratory plasticity and response to augmentation. OTHER None. Limitations:  none IMPRESSION: Negative. Electronically Signed   By: Fidela Salisbury M.D.   On: 04/04/2021 03:32    EKG: Sinus rhythm interventricular conduction delay rate of 60  ASSESSMENT AND PLAN:  Congestive heart failure Anasarca Profound anemia Pancytopenia Obesity .  COPD Multivessel coronary artery disease Respiratory failure Peripheral vascular disease Previous history of smoking Ischemic cardiomyopathy AICD in place Status post respiratory infection probably viral . Plan Agree with diuretic therapy for heart failure management and control Agree with transfusion maintain hemoglobin above 8or 9  Institute systolic heart failure management and care with diuretics beta-blocker Kathreen Cornfield beta-blocker diuretic therapy follow-up with heart failure clinic Agree with hematology involvement and evaluation for pancytopenia Do not necessarily recommend AICD interrogation at this point No invasive cardiac studies anticipated or recommended at this stage Continue inhaler therapy as  necessary for inflammation secondary to viral infection  Signed: Yolonda Kida MD 04/04/2021, 2:38 PM

## 2021-04-04 NOTE — Progress Notes (Addendum)
ANTICOAGULATION CONSULT NOTE - Initial Consult  Pharmacy Consult for Heparin  Indication: ACS/NSTEMI   Allergies  Allergen Reactions   Iodinated Diagnostic Agents Shortness Of Breath   Iodinated Glycerol  [Glycerol, Iodinated] Shortness Of Breath    Patient Measurements: Height: 5\' 10"  (177.8 cm) Weight: 99.8 kg (220 lb) IBW/kg (Calculated) : 73 Heparin Dosing Weight: 93.8 kg   Vital Signs: Temp: 98.1 F (36.7 C) (12/22 2216) Temp Source: Oral (12/22 2216) BP: 196/92 (12/22 2342) Pulse Rate: 93 (12/22 2342)  Labs: Recent Labs    04/03/21 2223  HGB 5.7*  HCT 18.0*  PLT 65*  CREATININE 2.95*  TROPONINIHS 262*    Estimated Creatinine Clearance: 23.3 mL/min (A) (by C-G formula based on SCr of 2.95 mg/dL (H)).   Medical History: Past Medical History:  Diagnosis Date   Anginal pain ([place])    Asthma    Bladder infection, acute 03/2011   "had a whole lot of bleeding from this"   CHF (congestive heart failure) ([facility])    Coronary artery disease    High cholesterol    Hypertension    Macular degeneration 12/14/2011   "had it in my right; getting shots now in my left"   Myocardial infarction [facility]) 1996   NSTEMI (non-ST elevated myocardial infarction) ([place]) 12/14/2011   Shortness of breath 12/14/2011   "@ rest, lying down, w/exertion"    Medications:  (Not in a hospital admission)   Assessment: Pharmacy consulted to dose heparin in this 80 year old male admitted with ACS/NSTEMI.  No prior anticoag noted.  CrCl = 23.3 ml/min  Goal of Therapy:  Heparin level 0.3-0.7 units/ml Monitor platelets by anticoagulation protocol: Yes   Plan:  Give 4000 units bolus x 1 Start heparin infusion at 1300 units/hr Check anti-Xa level in 8 hours and daily while on heparin Continue to monitor H&H and platelets  12/23 @ 0200 :   Heparin gtt d/c'd due to concern for possible bleeding.  Will need to F/U plans for heparin .  Margerite Impastato D 04/04/2021,12:45 AM

## 2021-04-04 NOTE — Consult Note (Signed)
Hematology/Oncology Consult note [facility] [phone] [phone]  Patient Care Team: Tracie Harrier, MD as PCP - General (Internal Medicine) Yolonda Kida, MD as Consulting Physician (Cardiology) Alisa Graff, FNP as Nurse Practitioner (Family Medicine) [name], MD as Consulting Physician (Cardiology)   Name of the patient: Peter [name]  [id number]  10/16/1940   Date of visit: 04/04/21 REASON FOR COSULTATION:  Pancytopenia History of presenting illness-  80 y.o. male with PMH listed at below who presents to ER for evaluation of shortness of breath associated with worsening of bilateral lower extremity swelling. He reports having RSV and influenza infection around Thanksgiving and since then he has not felt completely recovered.  No fever or chills. Denies any melena or blood in the stool.  Denies any abdominal pain Patient has extensive cardiovascular disease including CAD, status post PCI/stenting, hypertension, CHF. He noticed increased bruising.  Patient is on Plavix.  Initial blood work showed pancytopenia 04/03/2021, hemoglobin 5.7, platelet count 65,000.  White count 4.9, troponin was elevated.  Normal coags.  Total bilirubin 0.9, direct bilirubin 0.1. Chest x-ray showed blunting of bilateral costophrenic angles.  Trace pleural effusion not excluded. CT scan of the abdomen pelvis shows no acute findings in abdomen or pelvis to suggest retroperitoneal hemorrhage. aortic endograft placement Cholelithiasis.  Lower extremity Doppler negative for DVT.  EKG showed sinus rhythm with frequent PVCs and PACs.  Left bundle branch block.  Patient is admitted for severe anemia, thrombocytopenia, elevated troponin.  Hematology oncology was consulted for further evaluation of anemia and thrombocytopenia.  Has been transfused with 2 units of packed PRBC.  Patient was seen at bedside.  He reports feeling better  today.   Review of Systems  Constitutional:  Positive for fatigue. Negative for chills and fever.  HENT:   Negative for hearing loss and voice change.   Eyes:  Negative for eye problems and icterus.  Respiratory:  Positive for shortness of breath. Negative for chest tightness, cough and hemoptysis.   Cardiovascular:  Negative for chest pain and leg swelling.  Gastrointestinal:  Negative for abdominal distention, abdominal pain, blood in stool, diarrhea, nausea and vomiting.  Endocrine: Negative for hot flashes.  Genitourinary:  Negative for difficulty urinating, dysuria and frequency.   Musculoskeletal:  Negative for arthralgias.  Skin:  Negative for itching and rash.  Neurological:  Negative for light-headedness and numbness.  Hematological:  Negative for adenopathy. Bruises/bleeds easily.  Psychiatric/Behavioral:  Negative for confusion.    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 12/19/2018   CKD (chronic kidney disease) stage 3, GFR 30-59 ml/min (HCC) 05/18/2018   Elevated hemoglobin A1c 05/18/2018   History of skin cancer 05/18/2018   Ischemic cardiomyopathy 12/16/2017   S/P ICD (internal cardiac defibrillator) procedure 12/16/2017   Acute on chronic systolic CHF (congestive heart failure) ([place]) 09/07/2016   Congestive heart failure ([place]) 01/17/2015   Current tobacco use 01/17/2015  Hypercholesterolemia 01/17/2015   Peripheral vascular disease ([place]) 01/17/2015   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   Arthritis, degenerative 07/22/2013   H/O angina pectoris 07/22/2013   Adiposity 07/22/2013   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   Hypokalemia 12/15/2011   SOB (shortness of breath) 12/14/2011   CAD (coronary artery disease) 12/14/2011   HTN (hypertension) 12/14/2011   Hyperlipidemia 12/14/2011   GERD (gastroesophageal reflux disease) 12/14/2011   BPH (benign prostatic hyperplasia) 12/14/2011   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/14/2011   Aneurysm of thoracic aorta 12/14/2011     Past Medical History:  Diagnosis Date   Anginal pain ([place])    Asthma    Bladder infection, acute 03/2011   "had a whole lot of bleeding from this"   CHF (congestive heart failure) ([place])    Coronary artery disease    High cholesterol    Hypertension    Macular degeneration 12/14/2011   "had it in my right; getting shots now in my left"   Myocardial infarction [facility]) 1996   NSTEMI (non-ST elevated myocardial infarction) ([place]) 12/14/2011   Shortness of breath 12/14/2011   "@ rest, lying down, w/exertion"     Past Surgical History:  Procedure Laterality Date   [place]; ~ 2003; ~ 2008   "total of 3"   CORONARY STENT INTERVENTION N/A 08/10/2016   Procedure: Coronary Stent Intervention;  Surgeon: [name], MD;  Location: [place];  Service: Cardiovascular;  Laterality: N/A;   HERNIA REPAIR  ~ 2001   "abdominal w/mesh implanted"   LEFT HEART CATH AND CORONARY ANGIOGRAPHY N/A 08/10/2016   Procedure: Left Heart Cath and Coronary Angiography;  Surgeon: [name], MD;  Location: [place] [facility];  Service: Cardiovascular;  Laterality: N/A;   LEFT HEART CATHETERIZATION WITH CORONARY ANGIOGRAM N/A 12/16/2011   Procedure: LEFT HEART CATHETERIZATION WITH CORONARY ANGIOGRAM;  Surgeon: Minus Breeding, MD;  Location: [facility] CATH LAB;  Service: Cardiovascular;  Laterality: N/A;   PERCUTANEOUS CORONARY STENT INTERVENTION (PCI-S) N/A 12/17/2011   Procedure: PERCUTANEOUS CORONARY STENT INTERVENTION (PCI-S);  Surgeon: Sherren Mocha, MD;  Location: [facility] CATH LAB;  Service: Cardiovascular;  Laterality: N/A;   TONSILLECTOMY     "I was a kid"    Social History   Socioeconomic History   Marital status: Widowed    Spouse name: Not on file   Number of children: Not on file   Years of education: Not on file   Highest education level: Not on file  Occupational History   Not on file  Tobacco Use   Smoking status: Former    Packs/day: 0.50    Years: 0.50    Pack years: 0.25    Types: Cigarettes   Smokeless tobacco: Never   Tobacco comments:    11/23/16 Still not ready to quit smoking.  Substance and Sexual Activity   Alcohol use: Yes    Alcohol/week: 1.0 standard drink    Types: 1 Cans of beer per week    Comment: 12/14/2011 "if my kidneys are sore, I drink 1 beer/day; if not sore; no beer; occasionally drink socially; ave 1 beer/wk maybe"   Drug use: No   Sexual activity: Not Currently  Other Topics Concern   Not on file  Social History Narrative   Not on file   Social Determinants of Health   Financial Resource Strain: Not on file  Food Insecurity: Not on file  Transportation Needs: Not on file  Physical Activity: Not  on file  Stress: Not on file  Social Connections: Not on file  Intimate Partner Violence: Not on file     Family History  Family history unknown: Yes     Current Facility-Administered Medications:    0.9 %  sodium chloride infusion, 10 mL/hr, Intravenous, Once, Carrie Mew, MD, Held at 04/04/21 0224   0.9 %  sodium chloride infusion, 250 mL, Intravenous, PRN, Agbata, Tochukwu, MD   acetaminophen (TYLENOL) tablet 650 mg, 650 mg, Oral, Q6H PRN **OR** acetaminophen (TYLENOL) suppository 650 mg, 650 mg, Rectal, Q6H PRN, Agbata, Tochukwu, MD   amLODipine  (NORVASC) tablet 10 mg, 10 mg, Oral, Daily, Agbata, Tochukwu, MD, 10 mg at 04/04/21 1042   atorvastatin (LIPITOR) tablet 40 mg, 40 mg, Oral, q1800, Agbata, Tochukwu, MD   carvedilol (COREG) tablet 6.25 mg, 6.25 mg, Oral, BID WC, Agbata, Tochukwu, MD   finasteride (PROSCAR) tablet 5 mg, 5 mg, Oral, Daily, Agbata, Tochukwu, MD, 5 mg at 04/04/21 1043   isosorbide mononitrate (IMDUR) 24 hr tablet 30 mg, 30 mg, Oral, Daily, Agbata, Tochukwu, MD, 30 mg at 04/04/21 1043   ondansetron (ZOFRAN) tablet 4 mg, 4 mg, Oral, Q6H PRN **OR** ondansetron (ZOFRAN) injection 4 mg, 4 mg, Intravenous, Q6H PRN, Agbata, Tochukwu, MD   pantoprazole (PROTONIX) EC tablet 40 mg, 40 mg, Oral, Daily, Agbata, Tochukwu, MD, 40 mg at 04/04/21 1043   sodium chloride flush (NS) 0.9 % injection 3 mL, 3 mL, Intravenous, Q12H, Agbata, Tochukwu, MD, 3 mL at 04/04/21 1043   sodium chloride flush (NS) 0.9 % injection 3 mL, 3 mL, Intravenous, PRN, Agbata, Tochukwu, MD   tamsulosin (FLOMAX) capsule 0.4 mg, 0.4 mg, Oral, Daily, Agbata, Tochukwu, MD, 0.4 mg at 04/04/21 1042   vitamin B-12 (CYANOCOBALAMIN) tablet 1,000 mcg, 1,000 mcg, Oral, Daily, Agbata, Tochukwu, MD, 1,000 mcg at 04/04/21 1043  Current Outpatient Medications:    amLODipine (NORVASC) 10 MG tablet, Take 10 mg by mouth daily., Disp: , Rfl:    atorvastatin (LIPITOR) 40 MG tablet, Take 1 tablet (40 mg total) by mouth daily at 6 PM., Disp: 30 tablet, Rfl: 0   carvedilol (COREG) 6.25 MG tablet, Take 6.25 mg by mouth 2 (two) times daily with a meal., Disp: , Rfl:    clopidogrel (PLAVIX) 75 MG tablet, TAKE 1 TABLET(75 MG) BY MOUTH EVERY DAY, Disp: 30 tablet, Rfl: 1   furosemide (LASIX) 20 MG tablet, Take 20 mg by mouth daily., Disp: , Rfl:    isosorbide mononitrate (IMDUR) 30 MG 24 hr tablet, Take 30 mg by mouth daily., Disp: , Rfl:    losartan (COZAAR) 100 MG tablet, Take 100 mg by mouth daily., Disp: , Rfl:    omeprazole (PRILOSEC) 20 MG capsule, Take by mouth., Disp: , Rfl:     spironolactone (ALDACTONE) 25 MG tablet, Take 6.25 mg by mouth daily., Disp: , Rfl:    tamsulosin (FLOMAX) 0.4 MG CAPS capsule, Take 1 capsule (0.4 mg total) by mouth daily., Disp: 90 capsule, Rfl: 3   cetirizine (ZYRTEC) 10 MG tablet, Take 10 mg by mouth., Disp: , Rfl:    clobetasol cream (TEMOVATE) 0.05 %, APPLY TO PSORIASIS BID PRN UNTIL SMOOTH, Disp: , Rfl: 1   cyanocobalamin 1000 MCG tablet, Take 1,000 mcg by mouth daily. , Disp: , Rfl:    finasteride (PROSCAR) 5 MG tablet, Take 1 tablet (5 mg total) by mouth daily., Disp: 90 tablet, Rfl: 3   GLUCOSAMINE-CHONDROITIN-VIT C PO, Take 1 tablet by mouth daily., Disp: , Rfl:  predniSONE (DELTASONE) 10 MG tablet, Take 1 tablet by mouth daily. 6, 5, 4, 3, 2, 1, Disp: , Rfl:    Physical exam:  Vitals:   04/04/21 0915 04/04/21 0930 04/04/21 0945 04/04/21 1219  BP:  (!) 231/96  (!) 198/65  Pulse: 62  65 65  Resp:  18 13 19   Temp:    (!) 97.5 F (36.4 C)  TempSrc:    Oral  SpO2: 100%  100%   Weight:      Height:       Physical Exam Constitutional:      General: He is not in acute distress.    Appearance: He is not diaphoretic.  HENT:     Head: Normocephalic and atraumatic.     Nose: Nose normal.     Mouth/Throat:     Pharynx: No oropharyngeal exudate.  Eyes:     General: No scleral icterus.    Pupils: Pupils are equal, round, and reactive to light.  Cardiovascular:     Rate and Rhythm: Normal rate and regular rhythm.     Heart sounds: No murmur heard. Pulmonary:     Effort: Pulmonary effort is normal. No respiratory distress.     Breath sounds: No rales.  Chest:     Chest wall: No tenderness.  Abdominal:     General: There is no distension.     Palpations: Abdomen is soft.     Tenderness: There is no abdominal tenderness.  Musculoskeletal:        General: Normal range of motion.     Cervical back: Normal range of motion and neck supple.  Skin:    General: Skin is warm and dry.     Findings: Bruising present. No  erythema.  Neurological:     Mental Status: He is alert and oriented to person, place, and time.     Cranial Nerves: No cranial nerve deficit.     Motor: No abnormal muscle tone.     Coordination: Coordination normal.  Psychiatric:        Mood and Affect: Affect normal.        CMP Latest Ref Rng & Units 04/04/2021  Glucose 70 - 99 mg/dL -  BUN 8 - 23 mg/dL -  Creatinine 0.61 - 1.24 mg/dL -  Sodium 135 - 145 mmol/L -  Potassium 3.5 - 5.1 mmol/L -  Chloride 98 - 111 mmol/L -  CO2 22 - 32 mmol/L -  Calcium 8.9 - 10.3 mg/dL -  Total Protein 6.5 - 8.1 g/dL 5.0(L)  Total Bilirubin 0.3 - 1.2 mg/dL 0.9  Alkaline Phos 38 - 126 U/L 73  AST 15 - 41 U/L 14(L)  ALT 0 - 44 U/L 30   CBC Latest Ref Rng & Units 04/03/2021  WBC 4.0 - 10.5 K/uL 4.9  Hemoglobin 13.0 - 17.0 g/dL 5.7(L)  Hematocrit 39.0 - 52.0 % 18.0(L)  Platelets 150 - 400 K/uL 65(L)    RADIOGRAPHIC STUDIES: I have personally reviewed the radiological images as listed and agreed with the findings in the report. CT ABDOMEN PELVIS WO CONTRAST  Result Date: 04/04/2021 CLINICAL DATA:  Retroperitoneal hemorrhage suspected. EXAM: CT ABDOMEN AND PELVIS WITHOUT CONTRAST TECHNIQUE: Multidetector CT imaging of the abdomen and pelvis was performed following the standard protocol without IV contrast. COMPARISON:  01/29/2016 FINDINGS: Lower chest: Bibasilar atelectasis noted with tiny bilateral pleural effusions. Hepatobiliary: No focal abnormality in the liver on this study without intravenous contrast. Layering tiny gallstones evident. No intrahepatic or extrahepatic biliary  dilation. Pancreas: No focal mass lesion. No dilatation of the main duct. No intraparenchymal cyst. No peripancreatic edema. Spleen: No splenomegaly. No focal mass lesion. Adrenals/Urinary Tract: No adrenal nodule or mass. Small cyst noted upper pole left kidney. No evidence for hydroureter. The urinary bladder appears normal for the degree of distention. Stomach/Bowel:  Stomach is unremarkable. No gastric wall thickening. No evidence of outlet obstruction. Duodenum is normally positioned as is the ligament of Treitz. No small bowel wall thickening. No small bowel dilatation. The terminal ileum is normal. The appendix is normal. No gross colonic mass. No colonic wall thickening. Diverticular changes are noted in the left colon without evidence of diverticulitis. 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 Low-attenuation left para-aortic lymph nodes (image 32/2) are stable since 2017 consistent with benign etiology. No pelvic sidewall lymphadenopathy. Reproductive: The prostate gland and seminal vesicles are unremarkable. Other: No intraperitoneal free fluid. Musculoskeletal: Multiple supraumbilical ventral hernias identified, containing only fat. Stable bone island left anterior pubic ramus. No worrisome lytic or sclerotic osseous abnormality. IMPRESSION: 1. No acute findings in the abdomen or pelvis. Specifically, no findings to suggests retroperitoneal hemorrhage. 2. 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 3. No change in the left para-aortic lymphadenopathy consistent with benign/reactive etiology. 4. Cholelithiasis. 5. Multiple supraumbilical ventral hernias containing only fat. Electronically Signed   By: Misty Stanley M.D.   On: 04/04/2021 07:36   DG Chest 2 View  Result Date: 04/03/2021 CLINICAL DATA:  sob EXAM: CHEST - 2 VIEW COMPARISON:  Chest x-ray 09/07/2016, CT chest 12/14/2011. FINDINGS: The heart and mediastinal contours are unchanged. Coronary artery stent. Aortic calcification. The distal descending thoracic aorta/suprarenal abdominal aorta stent noted on lateral view. Three lead cardiac pacemaker/defibrillator overlies the left chest wall. No focal consolidation. No pulmonary edema. Blunting of  bilateral costophrenic angles. No pneumothorax. No acute osseous abnormality. IMPRESSION: Blunting of bilateral costophrenic angles. Trace pleural effusions not excluded. Electronically Signed   By: Iven Finn M.D.   On: 04/03/2021 22:56   US Venous Img Lower Bilateral  Result Date: 04/04/2021 CLINICAL DATA:  Bilateral lower extremity edema, dyspnea EXAM: BILATERAL LOWER EXTREMITY VENOUS DOPPLER ULTRASOUND TECHNIQUE: Gray-scale sonography with compression, as well as color and duplex ultrasound, were performed to evaluate the deep venous system(s) from the level of the common femoral vein through the popliteal and proximal calf veins. COMPARISON:  None. FINDINGS: VENOUS Normal compressibility of the common femoral, superficial femoral, and popliteal veins, as well as the visualized calf veins. Visualized portions of profunda femoral vein and great saphenous vein unremarkable. No filling defects to suggest DVT on grayscale or color Doppler imaging. Doppler waveforms show normal direction of venous flow, normal respiratory plasticity and response to augmentation. OTHER None. Limitations: none IMPRESSION: Negative. Electronically Signed   By: Fidela Salisbury M.D.   On: 04/04/2021 03:32    Assessment and plan- Patient is a 80 y.o. male presented with acute onset of anemia and thrombocytopenia.  Recent upper respiratory infection.  #Acute anemia and thrombocytopenia. Check reticulocyte panel, LDH, immature platelet fraction, pathology smear, Haptoglobin.  LDH is slightly increased at 201.  Reticulocyte panel did not review any significant reticulocytosis.  Bilirubin is normal.  Hemoglobin has improved to 11.9 today.  Less likely hemolysis. I discussed with pathology Dr. Dessie Coma and the smear is still pending at the time of dictation. I suspect that patient's blood counts  may be secondary to marrow suppression from recent upper respiratory infection, anemia secondary to chronic kidney disease. Agrees  with PRBC transfusion to keep hemoglobin above 7. I will sign out to Dr. Cecille Aver who is on-call for the rest week to follow up the case.  Thank you for allowing me to participate in the care of this patient.   Earlie Server, MD, PhD  04/04/2021

## 2021-04-04 NOTE — ED Notes (Signed)
Pt signed paper copy of blood consent. Placed in medical records box

## 2021-04-05 DIAGNOSIS — D61818 Other pancytopenia: Secondary | ICD-10-CM | POA: Diagnosis not present

## 2021-04-05 LAB — BASIC METABOLIC PANEL
Anion gap: 4 — ABNORMAL LOW (ref 5–15)
BUN: 63 mg/dL — ABNORMAL HIGH (ref 8–23)
CO2: 25 mmol/L (ref 22–32)
Calcium: 7.8 mg/dL — ABNORMAL LOW (ref 8.9–10.3)
Chloride: 111 mmol/L (ref 98–111)
Creatinine, Ser: 2.84 mg/dL — ABNORMAL HIGH (ref 0.61–1.24)
GFR, Estimated: 22 mL/min — ABNORMAL LOW (ref >60)
Glucose, Bld: 103 mg/dL — ABNORMAL HIGH (ref 70–99)
Potassium: 3.8 mmol/L (ref 3.5–5.1)
Sodium: 140 mmol/L (ref 135–145)

## 2021-04-05 LAB — TYPE AND SCREEN
ABO/RH(D): O POS
Antibody Screen: NEGATIVE
Unit division: 0
Unit division: 0

## 2021-04-05 LAB — BPAM RBC
Blood Product Expiration Date: 202301152359
Blood Product Expiration Date: 202301162359
ISSUE DATE / TIME: 202212230205
ISSUE DATE / TIME: 202212230844
Unit Type and Rh: 5100
Unit Type and Rh: 5100

## 2021-04-05 LAB — CBC
HCT: 36 % — ABNORMAL LOW (ref 39.0–52.0)
Hemoglobin: 12.1 g/dL — ABNORMAL LOW (ref 13.0–17.0)
MCH: 29.4 pg (ref 26.0–34.0)
MCHC: 33.6 g/dL (ref 30.0–36.0)
MCV: 87.4 fL (ref 80.0–100.0)
Platelets: 114 10*3/uL — ABNORMAL LOW (ref 150–400)
RBC: 4.12 MIL/uL — ABNORMAL LOW (ref 4.22–5.81)
RDW: 14.1 % (ref 11.5–15.5)
WBC: 8.5 10*3/uL (ref 4.0–10.5)
nRBC: 0 % (ref 0.0–0.2)

## 2021-04-05 LAB — HAPTOGLOBIN: Haptoglobin: 159 mg/dL (ref 38–329)

## 2021-04-05 MED ORDER — FUROSEMIDE 10 MG/ML IJ SOLN
60.0000 mg | Freq: Two times a day (BID) | INTRAMUSCULAR | Status: AC
  Administered 2021-04-05 (×2): 60 mg via INTRAVENOUS
  Filled 2021-04-05 (×2): qty 8

## 2021-04-05 NOTE — Progress Notes (Incomplete)
[facility] Cardiology    SUBJECTIVE: Patient still complains of generalized weakness fatigue.  Also has some trouble swallowing and at times difficulty with breathing he feels relatively unsteady on an easy denies any discrete pain.  Status posttransfusion   Vitals:   04/05/21 0500 04/05/21 0530 04/05/21 0605 04/05/21 0630  BP: (!) 186/86 (!) 200/72 (!) 151/117 (!) 179/93  Pulse: 63 67 67 83  Resp: 18  19 18   Temp:      TempSrc:      SpO2: 96% 98% 95% 95%  Weight:      Height:         Intake/Output Summary (Last 24 hours) at 04/05/2021 1030 Last data filed at 04/05/2021 8250 Gross per 24 hour  Intake 46.33 ml  Output 1450 ml  Net -1403.67 ml      PHYSICAL EXAM  General: Well developed, well nourished, in no acute distress HEENT:  Normocephalic and atramatic Neck:  No JVD.  Lungs: Clear bilaterally to auscultation and percussion. Heart: HRRR . Normal S1 and S2 without gallops or murmurs.  Abdomen: Bowel sounds are positive, abdomen soft and non-tender  Msk:  Back normal, normal gait. Normal strength and tone for age. Extremities: No clubbing, cyanosis or edema.   Neuro: Alert and oriented X 3. Psych:  Good affect, responds appropriately   LABS: Basic Metabolic Panel: Recent Labs    04/03/21 2223 04/04/21 0025 04/05/21 0846  NA 138  --  140  K 4.6  --  3.8  CL 111  --  111  CO2 23  --  25  GLUCOSE 152*  --  103*  BUN 67*  --  63*  CREATININE 2.95*  --  2.84*  CALCIUM 7.5*  --  7.8*  MG  --  1.7  --    Liver Function Tests: Recent Labs    04/04/21 0025  AST 14*  ALT 30  ALKPHOS 73  BILITOT 0.9  PROT 5.0*  ALBUMIN 2.9*   Recent Labs    04/04/21 0025  LIPASE 42   CBC: Recent Labs    04/04/21 1036 04/05/21 0846  WBC 11.8* 8.5  NEUTROABS 8.5*  --   HGB 11.9* 12.1*  HCT 35.9* 36.0*  MCV 89.5 87.4  PLT 115* 114*   Cardiac Enzymes: No results for input(s): CKTOTAL, CKMB, CKMBINDEX, TROPONINI in the last 72 hours. BNP: Invalid input(s):  POCBNP D-Dimer: No results for input(s): DDIMER in the last 72 hours. Hemoglobin A1C: No results for input(s): HGBA1C in the last 72 hours. Fasting Lipid Panel: No results for input(s): CHOL, HDL, LDLCALC, TRIG, CHOLHDL, LDLDIRECT in the last 72 hours. Thyroid Function Tests: Recent Labs    04/04/21 0025  TSH 0.598   Anemia Panel: Recent Labs    04/04/21 1036  RETICCTPCT 1.0    CT ABDOMEN PELVIS WO CONTRAST  Result Date: 04/04/2021 CLINICAL DATA:  Retroperitoneal hemorrhage suspected. EXAM: CT ABDOMEN AND PELVIS WITHOUT CONTRAST TECHNIQUE: Multidetector CT imaging of the abdomen and pelvis was performed following the standard protocol without IV contrast. COMPARISON:  01/29/2016 FINDINGS: Lower chest: Bibasilar atelectasis noted with tiny bilateral pleural effusions. Hepatobiliary: No focal abnormality in the liver on this study without intravenous contrast. Layering tiny gallstones evident. No intrahepatic or extrahepatic biliary dilation. Pancreas: No focal mass lesion. No dilatation of the main duct. No intraparenchymal cyst. No peripancreatic edema. Spleen: No splenomegaly. No focal mass lesion. Adrenals/Urinary Tract: No adrenal nodule or mass. Small cyst noted upper pole left kidney. No evidence for hydroureter.  The urinary bladder appears normal for the degree of distention. Stomach/Bowel: Stomach is unremarkable. No gastric wall thickening. No evidence of outlet obstruction. Duodenum is normally positioned as is the ligament of Treitz. No small bowel wall thickening. No small bowel dilatation. The terminal ileum is normal. The appendix is normal. No gross colonic mass. No colonic wall thickening. Diverticular changes are noted in the left colon without evidence of diverticulitis. 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 Low-attenuation left para-aortic lymph nodes (image 32/2) are stable since  2017 consistent with benign etiology. No pelvic sidewall lymphadenopathy. Reproductive: The prostate gland and seminal vesicles are unremarkable. Other: No intraperitoneal free fluid. Musculoskeletal: Multiple supraumbilical ventral hernias identified, containing only fat. Stable bone island left anterior pubic ramus. No worrisome lytic or sclerotic osseous abnormality. IMPRESSION: 1. No acute findings in the abdomen or pelvis. Specifically, no findings to suggests retroperitoneal hemorrhage. 2. 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 3. No change in the left para-aortic lymphadenopathy consistent with benign/reactive etiology. 4. Cholelithiasis. 5. Multiple supraumbilical ventral hernias containing only fat. Electronically Signed   By: Misty Stanley M.D.   On: 04/04/2021 07:36   DG Chest 2 View  Result Date: 04/03/2021 CLINICAL DATA:  sob EXAM: CHEST - 2 VIEW COMPARISON:  Chest x-ray 09/07/2016, CT chest 12/14/2011. FINDINGS: The heart and mediastinal contours are unchanged. Coronary artery stent. Aortic calcification. The distal descending thoracic aorta/suprarenal abdominal aorta stent noted on lateral view. Three lead cardiac pacemaker/defibrillator overlies the left chest wall. No focal consolidation. No pulmonary edema. Blunting of bilateral costophrenic angles. No pneumothorax. No acute osseous abnormality. IMPRESSION: Blunting of bilateral costophrenic angles. Trace pleural effusions not excluded. Electronically Signed   By: Iven Finn M.D.   On: 04/03/2021 22:56   US Venous Img Lower Bilateral  Result Date: 04/04/2021 CLINICAL DATA:  Bilateral lower extremity edema, dyspnea EXAM: BILATERAL LOWER EXTREMITY VENOUS DOPPLER ULTRASOUND TECHNIQUE: Gray-scale sonography with compression, as well as color and duplex ultrasound, were performed to evaluate the deep venous system(s) from the level of the common femoral vein through  the popliteal and proximal calf veins. COMPARISON:  None. FINDINGS: VENOUS Normal compressibility of the common femoral, superficial femoral, and popliteal veins, as well as the visualized calf veins. Visualized portions of profunda femoral vein and great saphenous vein unremarkable. No filling defects to suggest DVT on grayscale or color Doppler imaging. Doppler waveforms show normal direction of venous flow, normal respiratory plasticity and response to augmentation. OTHER None. Limitations: none IMPRESSION: Negative. Electronically Signed   By: Fidela Salisbury M.D.   On: 04/04/2021 03:32     Echo previous echo ejection fraction around 40 to 45%  TELEMETRY: Sinus rhythm nonspecific ST-T wave changes:  ASSESSMENT AND PLAN:  Principal Problem:   Pancytopenia ([place]) Active Problems:   CAD (coronary artery disease)   Benign prostatic hyperplasia with urinary obstruction   S/P ICD (internal cardiac defibrillator) procedure   CKD (chronic kidney disease) stage 4, GFR 15-29 ml/min (HCC)   S/P AAA (abdominal aortic aneurysm) repair   Acute anemia   AKI (acute kidney injury) ([place])    Plan Continue telemetry intensive care management and control Agree hematology work-up and evaluation Continue treatment for heart failure including diuresis Recommend nephrology input for renal insufficiency Known coronary disease relatively asymptomatic no recent angina Severe peripheral vascular disease including AAA repair Patient will need physical therapy to  help.  Rehab for generalized weakness Invasive cardiac procedures recommended at this stage   Yolonda Kida, MD 04/05/2021 10:30 AM

## 2021-04-05 NOTE — Progress Notes (Signed)
[place]  [phone] [phone]  ID: Peter [name] OB: [birth year]  MR#: [id number]  BWG#:[id number]  Patient Care Team: Tracie Harrier, MD as PCP - General (Internal Medicine) Yolonda Kida, MD as Consulting Physician (Cardiology) Alisa Graff, FNP as Nurse Practitioner (Family Medicine) [name], MD as Consulting Physician (Cardiology)  CHIEF COMPLAINT: Anemia and thrombocytopenia.  INTERVAL HISTORY: Patient feels significantly improved today with only mild shortness of breath.  Hemoglobin and platelets also improved.  Patient expresses desire to go home.  REVIEW OF SYSTEMS:   Review of Systems  Constitutional:  Positive for malaise/fatigue. Negative for fever and weight loss.  Respiratory:  Positive for shortness of breath. Negative for cough and hemoptysis.   Cardiovascular:  Positive for leg swelling. Negative for chest pain.  Gastrointestinal: Negative.  Negative for abdominal pain.  Genitourinary: Negative.  Negative for dysuria.  Musculoskeletal: Negative.  Negative for back pain.  Skin: Negative.  Negative for rash.  Neurological:  Positive for weakness. Negative for dizziness, focal weakness and headaches.  Psychiatric/Behavioral: Negative.  The patient is not nervous/anxious.    As per HPI. Otherwise, a complete review of systems is negative.  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; getting shots now in my left"   Myocardial infarction [facility]) 1996   NSTEMI (non-ST elevated myocardial infarction) ([place]) 12/14/2011   Shortness of breath 12/14/2011   "@ rest, lying down, w/exertion"    PAST SURGICAL HISTORY: Past  Surgical History:  Procedure Laterality Date   [place]; ~ 2003; ~ 2008   "total of 3"   CORONARY STENT INTERVENTION N/A 08/10/2016   Procedure: Coronary Stent Intervention;  Surgeon: [name], MD;  Location: [facility];  Service: Cardiovascular;  Laterality: N/A;   HERNIA REPAIR  ~ 2001   "abdominal w/mesh implanted"   LEFT HEART CATH AND CORONARY ANGIOGRAPHY N/A 08/10/2016   Procedure: Left Heart Cath and Coronary Angiography;  Surgeon: [name], MD;  Location: [facility];  Service: Cardiovascular;  Laterality: N/A;   LEFT HEART CATHETERIZATION WITH CORONARY ANGIOGRAM N/A 12/16/2011   Procedure: LEFT HEART CATHETERIZATION WITH CORONARY ANGIOGRAM;  Surgeon: Minus Breeding, MD;  Location[facility] C[facility] LAB;  Service: Cardiovascular;  Laterality: N/A;   PERCUTANEOUS CORONARY STENT INTERVENTION (PCI-S) N/A 12/17/2011   Procedure: PERCUTANEOUS CORONARY STENT INTERVENTION (PCI-S);  Surgeon: Sherren Mocha, MD;  Location: [facility] CATH LAB;  Service: Cardiovascular;  Laterality: N/A;   TONSILLECTOMY     "I was a kid"    FAMILY HISTORY: Family History  Family history unknown: Yes    ADVANCED DIRECTIVES (Y/N):  @ADVDIR @  HEALTH MAINTENANCE: Social History   Tobacco Use   Smoking status: Former    Packs/day: 0.50    Years: 0.50    Pack years: 0.25    Types: Cigarettes   Smokeless tobacco: Never   Tobacco comments:    11/23/16 Still not ready to quit smoking.  Substance Use Topics   Alcohol use: Yes    Alcohol/week: 1.0 standard drink    Types: 1 Cans of beer per week  Comment: 12/14/2011 "if my kidneys are sore, I drink 1 beer/day; if not sore; no beer; occasionally drink socially; ave 1 beer/wk maybe"   Drug use: No     Colonoscopy:  PAP:  Bone density:  Lipid panel:  Allergies  Allergen Reactions   Iodinated Contrast Media Shortness Of Breath   Iodinated Glycerol  [Glycerol, Iodinated] Shortness Of Breath     Current Facility-Administered Medications  Medication Dose Route Frequency Provider Last Rate Last Admin   0.9 %  sodium chloride infusion  250 mL Intravenous PRN Agbata, Tochukwu, MD       acetaminophen (TYLENOL) tablet 650 mg  650 mg Oral Q6H PRN Agbata, Tochukwu, MD       Or   acetaminophen (TYLENOL) suppository 650 mg  650 mg Rectal Q6H PRN Agbata, Tochukwu, MD       amLODipine (NORVASC) tablet 10 mg  10 mg Oral Daily Agbata, Tochukwu, MD   10 mg at 04/05/21 1043   atorvastatin (LIPITOR) tablet 40 mg  40 mg Oral q1800 Agbata, Tochukwu, MD   40 mg at 04/04/21 1813   carvedilol (COREG) tablet 6.25 mg  6.25 mg Oral BID WC Agbata, Tochukwu, MD   6.25 mg at 04/05/21 1042   enoxaparin (LOVENOX) injection 30 mg  30 mg Subcutaneous Q24H Agbata, Tochukwu, MD   30 mg at 04/05/21 1044   finasteride (PROSCAR) tablet 5 mg  5 mg Oral Daily Agbata, Tochukwu, MD   5 mg at 04/05/21 1040   furosemide (LASIX) injection 60 mg  60 mg Intravenous BID Enzo Bi, MD   60 mg at 04/05/21 1045   hydrALAZINE (APRESOLINE) injection 10 mg  10 mg Intravenous Q6H PRN Agbata, Tochukwu, MD   10 mg at 04/05/21 0602   isosorbide mononitrate (IMDUR) 24 hr tablet 30 mg  30 mg Oral Daily Agbata, Tochukwu, MD   30 mg at 04/05/21 1042   ondansetron (ZOFRAN) tablet 4 mg  4 mg Oral Q6H PRN Agbata, Tochukwu, MD       Or   ondansetron (ZOFRAN) injection 4 mg  4 mg Intravenous Q6H PRN Agbata, Tochukwu, MD       pantoprazole (PROTONIX) EC tablet 40 mg  40 mg Oral Daily Agbata, Tochukwu, MD   40 mg at 04/05/21 1043   sodium chloride flush (NS) 0.9 % injection 3 mL  3 mL Intravenous Q12H Agbata, Tochukwu, MD   3 mL at 04/05/21 1046   sodium chloride flush (NS) 0.9 % injection 3 mL  3 mL Intravenous PRN Agbata, Tochukwu, MD       tamsulosin (FLOMAX) capsule 0.4 mg  0.4 mg Oral Daily Agbata, Tochukwu, MD   0.4 mg at 04/05/21 1042   vitamin B-12 (CYANOCOBALAMIN) tablet 1,000 mcg  1,000 mcg Oral Daily Agbata, Tochukwu, MD   1,000 mcg at  04/05/21 1040   Current Outpatient Medications  Medication Sig Dispense Refill   amLODipine (NORVASC) 10 MG tablet Take 10 mg by mouth daily.     atorvastatin (LIPITOR) 40 MG tablet Take 1 tablet (40 mg total) by mouth daily at 6 PM. 30 tablet 0   carvedilol (COREG) 6.25 MG tablet Take 6.25 mg by mouth 2 (two) times daily with a meal.     clopidogrel (PLAVIX) 75 MG tablet TAKE 1 TABLET(75 MG) BY MOUTH EVERY DAY 30 tablet 1   furosemide (LASIX) 20 MG tablet Take 20 mg by mouth daily.     isosorbide mononitrate (IMDUR) 30 MG 24 hr tablet Take 30 mg  by mouth daily.     losartan (COZAAR) 100 MG tablet Take 100 mg by mouth daily.     omeprazole (PRILOSEC) 20 MG capsule Take by mouth.     spironolactone (ALDACTONE) 25 MG tablet Take 6.25 mg by mouth daily.     tamsulosin (FLOMAX) 0.4 MG CAPS capsule Take 1 capsule (0.4 mg total) by mouth daily. 90 capsule 3   cetirizine (ZYRTEC) 10 MG tablet Take 10 mg by mouth.     clobetasol cream (TEMOVATE) 0.05 % APPLY TO PSORIASIS BID PRN UNTIL SMOOTH  1   cyanocobalamin 1000 MCG tablet Take 1,000 mcg by mouth daily.      finasteride (PROSCAR) 5 MG tablet Take 1 tablet (5 mg total) by mouth daily. 90 tablet 3   GLUCOSAMINE-CHONDROITIN-VIT C PO Take 1 tablet by mouth daily.     predniSONE (DELTASONE) 10 MG tablet Take 1 tablet by mouth daily. 6, 5, 4, 3, 2, 1      OBJECTIVE: Vitals:   04/05/21 1330 04/05/21 1430  BP: 120/67 (!) 139/44  Pulse: (!) 45 76  Resp: 16   Temp:    SpO2: 97% 98%     Body mass index is 31.57 kg/m.    ECOG FS:1 - Symptomatic but completely ambulatory  General: Well-developed, well-nourished, no acute distress. Eyes: Pink conjunctiva, anicteric sclera. HEENT: Normocephalic, moist mucous membranes. Lungs: No audible wheezing or coughing. Heart: Regular rate and rhythm. Abdomen: Soft, nontender, no obvious distention. Musculoskeletal: 1-2+ lower extremity edema.   Neuro: Alert, answering all questions appropriately. Cranial  nerves grossly intact. Skin: No rashes or petechiae noted. Psych: Normal affect.  LAB RESULTS:  Lab Results  Component Value Date   NA 140 04/05/2021   K 3.8 04/05/2021   CL 111 04/05/2021   CO2 25 04/05/2021   GLUCOSE 103 (H) 04/05/2021   BUN 63 (H) 04/05/2021   CREATININE 2.84 (H) 04/05/2021   CALCIUM 7.8 (L) 04/05/2021   PROT 5.0 (L) 04/04/2021   ALBUMIN 2.9 (L) 04/04/2021   AST 14 (L) 04/04/2021   ALT 30 04/04/2021   ALKPHOS 73 04/04/2021   BILITOT 0.9 04/04/2021   GFRNONAA 22 (L) 04/05/2021   GFRAA 54 (L) 12/21/2017    Lab Results  Component Value Date   WBC 8.5 04/05/2021   NEUTROABS 8.5 (H) 04/04/2021   HGB 12.1 (L) 04/05/2021   HCT 36.0 (L) 04/05/2021   MCV 87.4 04/05/2021   PLT 114 (L) 04/05/2021     STUDIES: CT ABDOMEN PELVIS WO CONTRAST  Result Date: 04/04/2021 CLINICAL DATA:  Retroperitoneal hemorrhage suspected. EXAM: CT ABDOMEN AND PELVIS WITHOUT CONTRAST TECHNIQUE: Multidetector CT imaging of the abdomen and pelvis was performed following the standard protocol without IV contrast. COMPARISON:  01/29/2016 FINDINGS: Lower chest: Bibasilar atelectasis noted with tiny bilateral pleural effusions. Hepatobiliary: No focal abnormality in the liver on this study without intravenous contrast. Layering tiny gallstones evident. No intrahepatic or extrahepatic biliary dilation. Pancreas: No focal mass lesion. No dilatation of the main duct. No intraparenchymal cyst. No peripancreatic edema. Spleen: No splenomegaly. No focal mass lesion. Adrenals/Urinary Tract: No adrenal nodule or mass. Small cyst noted upper pole left kidney. No evidence for hydroureter. The urinary bladder appears normal for the degree of distention. Stomach/Bowel: Stomach is unremarkable. No gastric wall thickening. No evidence of outlet obstruction. Duodenum is normally positioned as is the ligament of Treitz. No small bowel wall thickening. No small bowel dilatation. The terminal ileum is normal. The  appendix is normal. No gross colonic  mass. No colonic wall thickening. Diverticular changes are noted in the left colon without evidence of diverticulitis. 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 Low-attenuation left para-aortic lymph nodes (image 32/2) are stable since 2017 consistent with benign etiology. No pelvic sidewall lymphadenopathy. Reproductive: The prostate gland and seminal vesicles are unremarkable. Other: No intraperitoneal free fluid. Musculoskeletal: Multiple supraumbilical ventral hernias identified, containing only fat. Stable bone island left anterior pubic ramus. No worrisome lytic or sclerotic osseous abnormality. IMPRESSION: 1. No acute findings in the abdomen or pelvis. Specifically, no findings to suggests retroperitoneal hemorrhage. 2. 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 3. No change in the left para-aortic lymphadenopathy consistent with benign/reactive etiology. 4. Cholelithiasis. 5. Multiple supraumbilical ventral hernias containing only fat. Electronically Signed   By: Misty Stanley M.D.   On: 04/04/2021 07:36   DG Chest 2 View  Result Date: 04/03/2021 CLINICAL DATA:  sob EXAM: CHEST - 2 VIEW COMPARISON:  Chest x-ray 09/07/2016, CT chest 12/14/2011. FINDINGS: The heart and mediastinal contours are unchanged. Coronary artery stent. Aortic calcification. The distal descending thoracic aorta/suprarenal abdominal aorta stent noted on lateral view. Three lead cardiac pacemaker/defibrillator overlies the left chest wall. No focal consolidation. No pulmonary edema. Blunting of bilateral costophrenic angles. No pneumothorax. No acute osseous abnormality. IMPRESSION: Blunting of bilateral costophrenic angles. Trace pleural effusions not excluded. Electronically Signed   By: Iven Finn M.D.   On: 04/03/2021 22:56   US  Venous Img Lower Bilateral  Result Date: 04/04/2021 CLINICAL DATA:  Bilateral lower extremity edema, dyspnea EXAM: BILATERAL LOWER EXTREMITY VENOUS DOPPLER ULTRASOUND TECHNIQUE: Gray-scale sonography with compression, as well as color and duplex ultrasound, were performed to evaluate the deep venous system(s) from the level of the common femoral vein through the popliteal and proximal calf veins. COMPARISON:  None. FINDINGS: VENOUS Normal compressibility of the common femoral, superficial femoral, and popliteal veins, as well as the visualized calf veins. Visualized portions of profunda femoral vein and great saphenous vein unremarkable. No filling defects to suggest DVT on grayscale or color Doppler imaging. Doppler waveforms show normal direction of venous flow, normal respiratory plasticity and response to augmentation. OTHER None. Limitations: none IMPRESSION: Negative. Electronically Signed   By: Fidela Salisbury M.D.   On: 04/04/2021 03:32    ASSESSMENT: Anemia and thrombocytopenia.  PLAN:    1.  Anemia: Significantly improved with transfusion.  Hemoglobin increased to 12.1.  No evidence of hemolysis, no schistocytes on peripheral smear.  Possibly secondary to recent upper respiratory infection and baseline chronic renal insufficiency.  No further intervention needed. 2.  Thrombocytopenia: Significantly improved.  Platelet count increased to 114.  Monitor. 3.  Acute on chronic renal failure: Patient's baseline creatinine appears to be approximately 1.7-2.2 since at least July 2021.  Increased creatinine on admission is not terribly far from patient's baseline. 4.  Shortness of breath: Improved.  Likely multifactorial given acute drop in hemoglobin as well as underlying CHF as well as demand ischemia.  Appreciate cardiology input. 5.  Disposition: Okay from hematology standpoint to discharge patient with close follow-up with primary care.  Please arrange follow-up in the cancer center with Dr. Tasia Catchings in  1 to 2 weeks for his thrombocytopenia.  Appreciate consult, call with questions.    Lloyd Huger, MD   04/05/2021 3:18 PM

## 2021-04-05 NOTE — ED Notes (Signed)
Pt was given ginger ale and crackers.  

## 2021-04-05 NOTE — ED Notes (Signed)
Report to Crystal RN.

## 2021-04-05 NOTE — ED Notes (Signed)
PO fluids provided upon patient request

## 2021-04-05 NOTE — Progress Notes (Signed)
PROGRESS NOTE    [name]  UDJ:[id number] DOB: [birth year] DOA: 04/03/2021 PCP: Tracie Harrier, MD  241A/[id number]-AA   Assessment & Plan:   Principal Problem:   Pancytopenia ([name]) Active Problems:   CAD (coronary artery disease)   Benign prostatic hyperplasia with urinary obstruction   S/P ICD (internal cardiac defibrillator) procedure   CKD (chronic kidney disease) stage 4, GFR 15-29 ml/min (HCC)   S/P AAA (abdominal aortic aneurysm) repair   Acute anemia   AKI (acute kidney injury) ([place])   Peter [name] is a 80 y.o. male with medical history significant for coronary artery disease status post PCI with stent angioplasty, hypertension, anemia of chronic disease, chronic diastolic dysfunction CHF who presents to the ER for evaluation of several weeks of shortness of breath associated with bilateral lower extremity swelling. Patient states that he had a respiratory infection around Thanksgiving from RSV and influenza and has had difficulty breathing since then.  He notes bilateral lower extremity swelling and insomnia which he attributes to not being able to breathe when he lays down.    Pancytopenia, ruled out  Anemia --hemoglobin of 5.7 on presentation likely a lab error, since Hgb went up to 12.1 with just 2u pRBC.  Thrombocytopenia --initial low also likely due to lab error.  Up to 114 next day.  Elevated troponin likely due to demand ischemia --trop mid 200's, flat.  No chest pain.  history of coronary artery disease --Plavix held on admission due to concern of worsening anemia --resume plavix Continue statins, nitrates and carvedilol  Acute on chronic diastolic dysfunction CHF Last known LVEF was 50% from 09/22 --cont IV lasix 60 BID  Acute worsening of stage III chronic kidney disease At baseline patient has a serum creatinine of 2.3 but on admission it is 2.95.  Maybe cardio-renal, since Cr actually improved a bit after IV lasix given in the  ED. Plan: --cont IV lasix 60 BID --hold Cozaar Monitor renal function closely  BPH Continue Flomax and finasteride   Hypertension Continue amlodipine and carvedilol --cont IV diuretic   DVT prophylaxis: Lovenox SQ Code Status: Full code  Family Communication: friend updated at bedside today  Level of care: Progressive Dispo:   The patient is from: home Anticipated d/c is to: home Anticipated d/c date is: 1-2 days Patient currently is not medically ready to d/c due to: IV lasix   Subjective and Interval History:  Pt reported good urine output.  Still had leg swelling.  Reported sore throat and the sensation of something bubbling up.   Objective: Vitals:   04/05/21 1837 04/05/21 1900 04/05/21 2218 04/05/21 2341  BP: (!) 172/60 (!) 168/69 (!) 171/72 (!) 165/65  Pulse: 68 62 61 68  Resp:  15 16 20   Temp: 98.2 F (36.8 C)   98.1 F (36.7 C)  TempSrc: Oral   Oral  SpO2: 96% 97% 97% 97%  Weight:    95.3 kg  Height:    5\' 11"  (1.803 m)    Intake/Output Summary (Last 24 hours) at 04/06/2021 0111 Last data filed at 04/05/2021 2114 Gross per 24 hour  Intake 49.33 ml  Output --  Net 49.33 ml   Filed Weights   04/03/21 2221 04/05/21 2341  Weight: 99.8 kg 95.3 kg    Examination:   Constitutional: NAD, AAOx3 HEENT: conjunctivae and lids normal, EOMI CV: No cyanosis.   RESP: normal respiratory effort, on RA Extremities: edema in BLE SKIN: warm, dry Neuro: II - XII grossly intact.  Psych: Normal mood and affect.  Appropriate judgement and reason   Data Reviewed: I have personally reviewed following labs and imaging studies  CBC: Recent Labs  Lab 04/03/21 2223 04/04/21 1036 04/05/21 0846  WBC 4.9 11.8* 8.5  NEUTROABS  --  8.5*  --   HGB 5.7* 11.9* 12.1*  HCT 18.0* 35.9* 36.0*  MCV 93.8 89.5 87.4  PLT 65* 115* 403*   Basic Metabolic Panel: Recent Labs  Lab 04/03/21 2223 04/04/21 0025 04/05/21 0846  NA 138  --  140  K 4.6  --  3.8  CL 111  --   111  CO2 23  --  25  GLUCOSE 152*  --  103*  BUN 67*  --  63*  CREATININE 2.95*  --  2.84*  CALCIUM 7.5*  --  7.8*  MG  --  1.7  --    GFR: Estimated Creatinine Clearance: 24 mL/min (A) (by C-G formula based on SCr of 2.84 mg/dL (H)). Liver Function Tests: Recent Labs  Lab 04/04/21 0025  AST 14*  ALT 30  ALKPHOS 73  BILITOT 0.9  PROT 5.0*  ALBUMIN 2.9*   Recent Labs  Lab 04/04/21 0025  LIPASE 42   No results for input(s): AMMONIA in the last 168 hours. Coagulation Profile: Recent Labs  Lab 04/04/21 0025  INR 1.1   Cardiac Enzymes: No results for input(s): CKTOTAL, CKMB, CKMBINDEX, TROPONINI in the last 168 hours. BNP (last 3 results) No results for input(s): PROBNP in the last 8760 hours. HbA1C: No results for input(s): HGBA1C in the last 72 hours. CBG: No results for input(s): GLUCAP in the last 168 hours. Lipid Profile: No results for input(s): CHOL, HDL, LDLCALC, TRIG, CHOLHDL, LDLDIRECT in the last 72 hours. Thyroid Function Tests: Recent Labs    04/04/21 0025  TSH 0.598   Anemia Panel: Recent Labs    04/04/21 1036  RETICCTPCT 1.0   Sepsis Labs: No results for input(s): PROCALCITON, LATICACIDVEN in the last 168 hours.  Recent Results (from the past 240 hour(s))  Resp Panel by RT-PCR (Flu A&B, Covid) Nasopharyngeal Swab     Status: None   Collection Time: 04/04/21  4:23 AM   Specimen: Nasopharyngeal Swab; Nasopharyngeal(NP) swabs in vial transport medium  Result Value Ref Range Status   SARS Coronavirus 2 by RT PCR NEGATIVE NEGATIVE Final    Comment: (NOTE) SARS-CoV-2 target nucleic acids are NOT DETECTED.  The SARS-CoV-2 RNA is generally detectable in upper respiratory specimens during the acute phase of infection. The lowest concentration of SARS-CoV-2 viral copies this assay can detect is 138 copies/mL. A negative result does not preclude SARS-Cov-2 infection and should not be used as the sole basis for treatment or other patient  management decisions. A negative result may occur with  improper specimen collection/handling, submission of specimen other than nasopharyngeal swab, presence of viral mutation(s) within the areas targeted by this assay, and inadequate number of viral copies(<138 copies/mL). A negative result must be combined with clinical observations, patient history, and epidemiological information. The expected result is Negative.  Fact Sheet for Patients:  EntrepreneurPulse.com.au  Fact Sheet for Healthcare Providers:  IncredibleEmployment.be  This test is no t yet approved or cleared by the [place] FDA and  has been authorized for detection and/or diagnosis of SARS-CoV-2 by FDA under an Emergency Use Authorization (EUA). This EUA will remain  in effect (meaning this test can be used) for the duration of the COVID-19 declaration under Section 564(b)(1) of the Act, 21  U.S.C.section 360bbb-3(b)(1), unless the authorization is terminated  or revoked sooner.       Influenza A by PCR NEGATIVE NEGATIVE Final   Influenza B by PCR NEGATIVE NEGATIVE Final    Comment: (NOTE) The Xpert Xpress SARS-CoV-2/FLU/RSV plus assay is intended as an aid in the diagnosis of influenza from Nasopharyngeal swab specimens and should not be used as a sole basis for treatment. Nasal washings and aspirates are unacceptable for Xpert Xpress SARS-CoV-2/FLU/RSV testing.  Fact Sheet for Patients: EntrepreneurPulse.com.au  Fact Sheet for Healthcare Providers: IncredibleEmployment.be  This test is not yet approved or cleared by the [place] FDA and has been authorized for detection and/or diagnosis of SARS-CoV-2 by FDA under an Emergency Use Authorization (EUA). This EUA will remain in effect (meaning this test can be used) for the duration of the COVID-19 declaration under Section 564(b)(1) of the Act, 21 U.S.C. section 360bbb-3(b)(1),  unless the authorization is terminated or revoked.  Performed at [facility], [address], [place], [place] [address]       Radiology Studies: CT ABDOMEN PELVIS WO CONTRAST  Result Date: 04/04/2021 CLINICAL DATA:  Retroperitoneal hemorrhage suspected. EXAM: CT ABDOMEN AND PELVIS WITHOUT CONTRAST TECHNIQUE: Multidetector CT imaging of the abdomen and pelvis was performed following the standard protocol without IV contrast. COMPARISON:  01/29/2016 FINDINGS: Lower chest: Bibasilar atelectasis noted with tiny bilateral pleural effusions. Hepatobiliary: No focal abnormality in the liver on this study without intravenous contrast. Layering tiny gallstones evident. No intrahepatic or extrahepatic biliary dilation. Pancreas: No focal mass lesion. No dilatation of the main duct. No intraparenchymal cyst. No peripancreatic edema. Spleen: No splenomegaly. No focal mass lesion. Adrenals/Urinary Tract: No adrenal nodule or mass. Small cyst noted upper pole left kidney. No evidence for hydroureter. The urinary bladder appears normal for the degree of distention. Stomach/Bowel: Stomach is unremarkable. No gastric wall thickening. No evidence of outlet obstruction. Duodenum is normally positioned as is the ligament of Treitz. No small bowel wall thickening. No small bowel dilatation. The terminal ileum is normal. The appendix is normal. No gross colonic mass. No colonic wall thickening. Diverticular changes are noted in the left colon without evidence of diverticulitis. 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 Low-attenuation left para-aortic lymph nodes (image 32/2) are stable since 2017 consistent with benign etiology. No pelvic sidewall lymphadenopathy. Reproductive: The prostate gland and seminal vesicles are unremarkable. Other: No intraperitoneal free fluid. Musculoskeletal: Multiple supraumbilical ventral  hernias identified, containing only fat. Stable bone island left anterior pubic ramus. No worrisome lytic or sclerotic osseous abnormality. IMPRESSION: 1. No acute findings in the abdomen or pelvis. Specifically, no findings to suggests retroperitoneal hemorrhage. 2. 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 3. No change in the left para-aortic lymphadenopathy consistent with benign/reactive etiology. 4. Cholelithiasis. 5. Multiple supraumbilical ventral hernias containing only fat. Electronically Signed   By: Misty Stanley M.D.   On: 04/04/2021 07:36   US Venous Img Lower Bilateral  Result Date: 04/04/2021 CLINICAL DATA:  Bilateral lower extremity edema, dyspnea EXAM: BILATERAL LOWER EXTREMITY VENOUS DOPPLER ULTRASOUND TECHNIQUE: Gray-scale sonography with compression, as well as color and duplex ultrasound, were performed to evaluate the deep venous system(s) from the level of the common femoral vein through the popliteal and proximal calf veins. COMPARISON:  None. FINDINGS: VENOUS Normal compressibility of the common femoral, superficial femoral, and popliteal veins, as well as the  visualized calf veins. Visualized portions of profunda femoral vein and great saphenous vein unremarkable. No filling defects to suggest DVT on grayscale or color Doppler imaging. Doppler waveforms show normal direction of venous flow, normal respiratory plasticity and response to augmentation. OTHER None. Limitations: none IMPRESSION: Negative. Electronically Signed   By: Fidela Salisbury M.D.   On: 04/04/2021 03:32     Scheduled Meds:  amLODipine  10 mg Oral Daily   atorvastatin  40 mg Oral q1800   carvedilol  6.25 mg Oral BID WC   enoxaparin (LOVENOX) injection  30 mg Subcutaneous Q24H   finasteride  5 mg Oral Daily   isosorbide mononitrate  30 mg Oral Daily   pantoprazole  40 mg Oral Daily   sodium chloride flush  3 mL Intravenous Q12H    tamsulosin  0.4 mg Oral Daily   cyanocobalamin  1,000 mcg Oral Daily   Continuous Infusions:  sodium chloride       LOS: 2 days     Enzo Bi, MD [facility] If 7PM-7AM, please contact night-coverage 04/06/2021, 1:11 AM

## 2021-04-06 DIAGNOSIS — D61818 Other pancytopenia: Secondary | ICD-10-CM | POA: Diagnosis not present

## 2021-04-06 LAB — ECHOCARDIOGRAM COMPLETE
AR max vel: 3.01 cm2
AV Area VTI: 3.09 cm2
AV Area mean vel: 3.08 cm2
AV Mean grad: 4 mmHg
AV Peak grad: 6.7 mmHg
Ao pk vel: 1.29 m/s
Area-P 1/2: 4.83 cm2
Height: 70 in
S' Lateral: 3.3 cm
Weight: 3520 oz

## 2021-04-06 LAB — CBC
HCT: 35.2 % — ABNORMAL LOW (ref 39.0–52.0)
Hemoglobin: 11.7 g/dL — ABNORMAL LOW (ref 13.0–17.0)
MCH: 29.3 pg (ref 26.0–34.0)
MCHC: 33.2 g/dL (ref 30.0–36.0)
MCV: 88.2 fL (ref 80.0–100.0)
Platelets: 104 10*3/uL — ABNORMAL LOW (ref 150–400)
RBC: 3.99 MIL/uL — ABNORMAL LOW (ref 4.22–5.81)
RDW: 14 % (ref 11.5–15.5)
WBC: 7.7 10*3/uL (ref 4.0–10.5)
nRBC: 0 % (ref 0.0–0.2)

## 2021-04-06 LAB — BASIC METABOLIC PANEL
Anion gap: 7 (ref 5–15)
BUN: 58 mg/dL — ABNORMAL HIGH (ref 8–23)
CO2: 26 mmol/L (ref 22–32)
Calcium: 8.1 mg/dL — ABNORMAL LOW (ref 8.9–10.3)
Chloride: 107 mmol/L (ref 98–111)
Creatinine, Ser: 2.87 mg/dL — ABNORMAL HIGH (ref 0.61–1.24)
GFR, Estimated: 21 mL/min — ABNORMAL LOW (ref >60)
Glucose, Bld: 91 mg/dL (ref 70–99)
Potassium: 3.6 mmol/L (ref 3.5–5.1)
Sodium: 140 mmol/L (ref 135–145)

## 2021-04-06 LAB — MAGNESIUM: Magnesium: 1.9 mg/dL (ref 1.7–2.4)

## 2021-04-06 MED ORDER — HYDRALAZINE HCL 25 MG PO TABS
25.0000 mg | ORAL_TABLET | Freq: Three times a day (TID) | ORAL | Status: DC
  Administered 2021-04-06 (×2): 25 mg via ORAL
  Filled 2021-04-06 (×2): qty 1

## 2021-04-06 MED ORDER — HYDRALAZINE HCL 50 MG PO TABS
50.0000 mg | ORAL_TABLET | Freq: Three times a day (TID) | ORAL | Status: DC
  Administered 2021-04-07 – 2021-04-08 (×4): 50 mg via ORAL
  Filled 2021-04-06 (×4): qty 1

## 2021-04-06 MED ORDER — FUROSEMIDE 10 MG/ML IJ SOLN
60.0000 mg | Freq: Two times a day (BID) | INTRAMUSCULAR | Status: AC
  Administered 2021-04-06 (×2): 60 mg via INTRAVENOUS
  Filled 2021-04-06 (×2): qty 6

## 2021-04-06 MED ORDER — CLOPIDOGREL BISULFATE 75 MG PO TABS
75.0000 mg | ORAL_TABLET | Freq: Every day | ORAL | Status: DC
  Administered 2021-04-06 – 2021-04-09 (×4): 75 mg via ORAL
  Filled 2021-04-06 (×4): qty 1

## 2021-04-06 NOTE — Progress Notes (Signed)
PROGRESS NOTE    Peter [name]  TDV:[id number] DOB: 08-26-1940 DOA: 04/03/2021 PCP: Tracie Harrier, MD  241A/[id number]-AA   Assessment & Plan:   Principal Problem:   Pancytopenia ([place]) Active Problems:   CAD (coronary artery disease)   Benign prostatic hyperplasia with urinary obstruction   S/P ICD (internal cardiac defibrillator) procedure   CKD (chronic kidney disease) stage 4, GFR 15-29 ml/min (HCC)   S/P AAA (abdominal aortic aneurysm) repair   Acute anemia   AKI (acute kidney injury) ([place])   Peter [name] is a 80 y.o. male with medical history significant for coronary artery disease status post PCI with stent angioplasty, hypertension, anemia of chronic disease, chronic diastolic dysfunction CHF who presents to the ER for evaluation of several weeks of shortness of breath associated with bilateral lower extremity swelling. Patient states that he had a respiratory infection around Thanksgiving from RSV and influenza and has had difficulty breathing since then.  He notes bilateral lower extremity swelling and insomnia which he attributes to not being able to breathe when he lays down.    Pancytopenia, ruled out  Anemia --hemoglobin of 5.7 on presentation likely a lab error, since Hgb went up to 12.1 with just 2u pRBC.  Thrombocytopenia --initial low also likely due to lab error.  Up to 114 next day.  Elevated troponin likely due to demand ischemia --trop mid 200's, flat.  No chest pain.  history of coronary artery disease --Plavix held on admission due to concern of worsening anemia, since resumed --cont plavix Continue statins, nitrates and carvedilol  Acute on chronic diastolic dysfunction CHF Last known LVEF was 50% from 09/22 --cont IV lasix 60 mg BID  AKI vs progression of stage III chronic kidney disease At baseline patient has a serum creatinine of 2.3 but on admission it is 2.95.  Maybe cardio-renal, since Cr actually improved a bit after IV lasix given  in the ED. Plan: --cont IV lasix 60 mg BID --hold Cozaar --monitor Cr while diuresing  BPH Continue Flomax and finasteride   Hypertension Continue amlodipine and carvedilol --cont IV lasix 60 mg BID   DVT prophylaxis: Lovenox SQ Code Status: Full code  Family Communication:   Level of care: Progressive Dispo:   The patient is from: home Anticipated d/c is to: home Anticipated d/c date is: 1-2 days Patient currently is not medically ready to d/c due to: IV lasix   Subjective and Interval History:  Pt reported good urine output.  Breathing ok.  Still some leg swelling.   Objective: Vitals:   04/05/21 2341 04/06/21 0400 04/06/21 1109 04/06/21 2002  BP: (!) 165/65 (!) 175/55 (!) 150/73 (!) 143/82  Pulse: 68 63 73 71  Resp: 20 20 18 19   Temp: 98.1 F (36.7 C) 98 F (36.7 C) 98.1 F (36.7 C) 98.8 F (37.1 C)  TempSrc: Oral Oral  Oral  SpO2: 97% 95% 100% 98%  Weight: 95.3 kg     Height: 5\' 11"  (1.803 m)       Intake/Output Summary (Last 24 hours) at 04/06/2021 2101 Last data filed at 04/06/2021 1955 Gross per 24 hour  Intake 843 ml  Output 1125 ml  Net -282 ml   Filed Weights   04/03/21 2221 04/05/21 2341  Weight: 99.8 kg 95.3 kg    Examination:   Constitutional: NAD, AAOx3 HEENT: conjunctivae and lids normal, EOMI CV: No cyanosis.   RESP: normal respiratory effort, on RA Extremities: swelling improved in BLE SKIN: warm, dry Neuro: II -  XII grossly intact.   Psych: Normal mood and affect.  Appropriate judgement and reason   Data Reviewed: I have personally reviewed following labs and imaging studies  CBC: Recent Labs  Lab 04/03/21 2223 04/04/21 1036 04/05/21 0846 04/06/21 0512  WBC 4.9 11.8* 8.5 7.7  NEUTROABS  --  8.5*  --   --   HGB 5.7* 11.9* 12.1* 11.7*  HCT 18.0* 35.9* 36.0* 35.2*  MCV 93.8 89.5 87.4 88.2  PLT 65* 115* 114* 053*   Basic Metabolic Panel: Recent Labs  Lab 04/03/21 2223 04/04/21 0025 04/05/21 0846 04/06/21 0512   NA 138  --  140 140  K 4.6  --  3.8 3.6  CL 111  --  111 107  CO2 23  --  25 26  GLUCOSE 152*  --  103* 91  BUN 67*  --  63* 58*  CREATININE 2.95*  --  2.84* 2.87*  CALCIUM 7.5*  --  7.8* 8.1*  MG  --  1.7  --  1.9   GFR: Estimated Creatinine Clearance: 23.8 mL/min (A) (by C-G formula based on SCr of 2.87 mg/dL (H)). Liver Function Tests: Recent Labs  Lab 04/04/21 0025  AST 14*  ALT 30  ALKPHOS 73  BILITOT 0.9  PROT 5.0*  ALBUMIN 2.9*   Recent Labs  Lab 04/04/21 0025  LIPASE 42   No results for input(s): AMMONIA in the last 168 hours. Coagulation Profile: Recent Labs  Lab 04/04/21 0025  INR 1.1   Cardiac Enzymes: No results for input(s): CKTOTAL, CKMB, CKMBINDEX, TROPONINI in the last 168 hours. BNP (last 3 results) No results for input(s): PROBNP in the last 8760 hours. HbA1C: No results for input(s): HGBA1C in the last 72 hours. CBG: No results for input(s): GLUCAP in the last 168 hours. Lipid Profile: No results for input(s): CHOL, HDL, LDLCALC, TRIG, CHOLHDL, LDLDIRECT in the last 72 hours. Thyroid Function Tests: Recent Labs    04/04/21 0025  TSH 0.598   Anemia Panel: Recent Labs    04/04/21 1036  RETICCTPCT 1.0   Sepsis Labs: No results for input(s): PROCALCITON, LATICACIDVEN in the last 168 hours.  Recent Results (from the past 240 hour(s))  Resp Panel by RT-PCR (Flu A&B, Covid) Nasopharyngeal Swab     Status: None   Collection Time: 04/04/21  4:23 AM   Specimen: Nasopharyngeal Swab; Nasopharyngeal(NP) swabs in vial transport medium  Result Value Ref Range Status   SARS Coronavirus 2 by RT PCR NEGATIVE NEGATIVE Final    Comment: (NOTE) SARS-CoV-2 target nucleic acids are NOT DETECTED.  The SARS-CoV-2 RNA is generally detectable in upper respiratory specimens during the acute phase of infection. The lowest concentration of SARS-CoV-2 viral copies this assay can detect is 138 copies/mL. A negative result does not preclude  SARS-Cov-2 infection and should not be used as the sole basis for treatment or other patient management decisions. A negative result may occur with  improper specimen collection/handling, submission of specimen other than nasopharyngeal swab, presence of viral mutation(s) within the areas targeted by this assay, and inadequate number of viral copies(<138 copies/mL). A negative result must be combined with clinical observations, patient history, and epidemiological information. The expected result is Negative.  Fact Sheet for Patients:  EntrepreneurPulse.com.au  Fact Sheet for Healthcare Providers:  IncredibleEmployment.be  This test is no t yet approved or cleared by the [place] FDA and  has been authorized for detection and/or diagnosis of SARS-CoV-2 by FDA under an Emergency Use Authorization (EUA). This  EUA will remain  in effect (meaning this test can be used) for the duration of the COVID-19 declaration under Section 564(b)(1) of the Act, 21 U.S.C.section 360bbb-3(b)(1), unless the authorization is terminated  or revoked sooner.       Influenza A by PCR NEGATIVE NEGATIVE Final   Influenza B by PCR NEGATIVE NEGATIVE Final    Comment: (NOTE) The Xpert Xpress SARS-CoV-2/FLU/RSV plus assay is intended as an aid in the diagnosis of influenza from Nasopharyngeal swab specimens and should not be used as a sole basis for treatment. Nasal washings and aspirates are unacceptable for Xpert Xpress SARS-CoV-2/FLU/RSV testing.  Fact Sheet for Patients: EntrepreneurPulse.com.au  Fact Sheet for Healthcare Providers: IncredibleEmployment.be  This test is not yet approved or cleared by the [place] FDA and has been authorized for detection and/or diagnosis of SARS-CoV-2 by FDA under an Emergency Use Authorization (EUA). This EUA will remain in effect (meaning this test can be used) for the duration of  the COVID-19 declaration under Section 564(b)(1) of the Act, 21 U.S.C. section 360bbb-3(b)(1), unless the authorization is terminated or revoked.  Performed at [facility], [address] [place], [place] [address]       Radiology Studies: No results found.   Scheduled Meds:  amLODipine  10 mg Oral Daily   atorvastatin  40 mg Oral q1800   carvedilol  6.25 mg Oral BID WC   clopidogrel  75 mg Oral Daily   enoxaparin (LOVENOX) injection  30 mg Subcutaneous Q24H   finasteride  5 mg Oral Daily   hydrALAZINE  25 mg Oral Q8H   isosorbide mononitrate  30 mg Oral Daily   pantoprazole  40 mg Oral Daily   sodium chloride flush  3 mL Intravenous Q12H   tamsulosin  0.4 mg Oral Daily   cyanocobalamin  1,000 mcg Oral Daily   Continuous Infusions:  sodium chloride       LOS: 2 days     Enzo Bi, MD [facility] If 7PM-7AM, please contact night-coverage 04/06/2021, 9:01 PM

## 2021-04-06 NOTE — Progress Notes (Signed)
[facility] Cardiology    SUBJECTIVE: Patient resting comfortably in bed asleep blood pressure slightly better controlled we will continue to advance blood pressure medications   Vitals:   04/05/21 2218 04/05/21 2341 04/06/21 0400 04/06/21 1109  BP: (!) 171/72 (!) 165/65 (!) 175/55 (!) 150/73  Pulse: 61 68 63 73  Resp: 16 20 20 18   Temp:  98.1 F (36.7 C) 98 F (36.7 C) 98.1 F (36.7 C)  TempSrc:  Oral Oral   SpO2: 97% 97% 95% 100%  Weight:  95.3 kg    Height:  5\' 11"  (1.803 m)       Intake/Output Summary (Last 24 hours) at 04/06/2021 1329 Last data filed at 04/06/2021 1300 Gross per 24 hour  Intake 603 ml  Output 250 ml  Net 353 ml      PHYSICAL EXAM  General: Well developed, well nourished, in no acute distress HEENT:  Normocephalic and atramatic Neck:  No JVD.  Lungs: Clear bilaterally to auscultation and percussion. Heart: HRRR . Normal S1 and S2 without gallops or murmurs.  Abdomen: Bowel sounds are positive, abdomen soft and non-tender  Msk:  Back normal, normal gait. Normal strength and tone for age. Extremities: No clubbing, cyanosis or edema.   Neuro: Alert and oriented X 3. Psych:  Good affect, responds appropriately   LABS: Basic Metabolic Panel: Recent Labs    04/04/21 0025 04/05/21 0846 04/06/21 0512  NA  --  140 140  K  --  3.8 3.6  CL  --  111 107  CO2  --  25 26  GLUCOSE  --  103* 91  BUN  --  63* 58*  CREATININE  --  2.84* 2.87*  CALCIUM  --  7.8* 8.1*  MG 1.7  --  1.9   Liver Function Tests: Recent Labs    04/04/21 0025  AST 14*  ALT 30  ALKPHOS 73  BILITOT 0.9  PROT 5.0*  ALBUMIN 2.9*   Recent Labs    04/04/21 0025  LIPASE 42   CBC: Recent Labs    04/04/21 1036 04/05/21 0846 04/06/21 0512  WBC 11.8* 8.5 7.7  NEUTROABS 8.5*  --   --   HGB 11.9* 12.1* 11.7*  HCT 35.9* 36.0* 35.2*  MCV 89.5 87.4 88.2  PLT 115* 114* 104*   Cardiac Enzymes: No results for input(s): CKTOTAL, CKMB, CKMBINDEX, TROPONINI in the last 72  hours. BNP: Invalid input(s): POCBNP D-Dimer: No results for input(s): DDIMER in the last 72 hours. Hemoglobin A1C: No results for input(s): HGBA1C in the last 72 hours. Fasting Lipid Panel: No results for input(s): CHOL, HDL, LDLCALC, TRIG, CHOLHDL, LDLDIRECT in the last 72 hours. Thyroid Function Tests: Recent Labs    04/04/21 0025  TSH 0.598   Anemia Panel: Recent Labs    04/04/21 1036  RETICCTPCT 1.0    ECHOCARDIOGRAM COMPLETE  Result Date: 04/06/2021    ECHOCARDIOGRAM REPORT   Patient Name:   Peter [name] Date of Exam: 04/04/2021 Medical Rec #:  [id number]        Height:       70.0 in Accession #:    [id number]       Weight:       220.0 lb Date of Birth:  Dec 30, 1940       BSA:          2.174 m Patient Age:    80 years         BP:  198/65 mmHg Patient Gender: M                HR:           89 bpm. Exam Location:  [facility] Procedure: 2D Echo, Cardiac Doppler and Color Doppler Indications:     Elevated Troponin  History:         Patient has prior history of Echocardiogram examinations, most                  recent 08/08/2016. CHF, Previous Myocardial Infarction; Risk                  Factors:Hypertension.  Sonographer:     Sherrie Sport Referring Phys:  [id number] AGBATA Diagnosing Phys: Yolonda Kida MD  Sonographer Comments: Suboptimal parasternal window. IMPRESSIONS  1. Left ventricular ejection fraction, by estimation, is 50 to 55%. The left ventricle has low normal function. The left ventricle has no regional wall motion abnormalities. The left ventricular internal cavity size was mildly dilated. There is mild left ventricular hypertrophy. Left ventricular diastolic parameters are consistent with Grade II diastolic dysfunction (pseudonormalization).  2. Right ventricular systolic function is low normal. The right ventricular size is moderately enlarged.  3. The mitral valve is normal in structure. Mild mitral valve regurgitation.  4. The aortic valve is normal in  structure. Aortic valve regurgitation is mild. FINDINGS  Left Ventricle: Left ventricular ejection fraction, by estimation, is 50 to 55%. The left ventricle has low normal function. The left ventricle has no regional wall motion abnormalities. The left ventricular internal cavity size was mildly dilated. There is mild left ventricular hypertrophy. Left ventricular diastolic parameters are consistent with Grade II diastolic dysfunction (pseudonormalization). Right Ventricle: The right ventricular size is moderately enlarged. No increase in right ventricular wall thickness. Right ventricular systolic function is low normal. Left Atrium: Left atrial size was normal in size. Right Atrium: Right atrial size was normal in size. Pericardium: There is no evidence of pericardial effusion. Mitral Valve: The mitral valve is normal in structure. Mild mitral valve regurgitation. Tricuspid Valve: The tricuspid valve is normal in structure. Tricuspid valve regurgitation is mild. Aortic Valve: The aortic valve is normal in structure. Aortic valve regurgitation is mild. Aortic valve mean gradient measures 4.0 mmHg. Aortic valve peak gradient measures 6.7 mmHg. Aortic valve area, by VTI measures 3.09 cm. Pulmonic Valve: The pulmonic valve was normal in structure. Pulmonic valve regurgitation is not visualized. Aorta: The ascending aorta was not well visualized. IAS/Shunts: No atrial level shunt detected by color flow Doppler. Additional Comments: A device lead is visualized. There is no pleural effusion.  LEFT VENTRICLE PLAX 2D LVIDd:         4.90 cm   Diastology LVIDs:         3.30 cm   LV e' medial:    7.07 cm/s LV PW:         1.50 cm   LV E/e' medial:  14.0 LV IVS:        1.05 cm   LV e' lateral:   8.27 cm/s LVOT diam:     2.10 cm   LV E/e' lateral: 12.0 LV SV:         86 LV SV Index:   40 LVOT Area:     3.46 cm  RIGHT VENTRICLE RV Basal diam:  4.40 cm RV S prime:     14.40 cm/s TAPSE (M-mode): 4.7 cm LEFT ATRIUM  Index         RIGHT ATRIUM           Index LA diam:      2.90 cm 1.33 cm/m   RA Area:     27.00 cm LA Vol (A2C): 45.6 ml 20.98 ml/m  RA Volume:   85.90 ml  39.52 ml/m LA Vol (A4C): 56.1 ml 25.81 ml/m  AORTIC VALVE                    PULMONIC VALVE AV Area (Vmax):    3.01 cm     PV Vmax:        0.70 m/s AV Area (Vmean):   3.08 cm     PV Vmean:       44.100 cm/s AV Area (VTI):     3.09 cm     PV VTI:         0.107 m AV Vmax:           129.00 cm/s  PV Peak grad:   1.9 mmHg AV Vmean:          88.400 cm/s  PV Mean grad:   1.0 mmHg AV VTI:            0.279 m      RVOT Peak grad: 3 mmHg AV Peak Grad:      6.7 mmHg AV Mean Grad:      4.0 mmHg LVOT Vmax:         112.00 cm/s LVOT Vmean:        78.700 cm/s LVOT VTI:          0.249 m LVOT/AV VTI ratio: 0.89  AORTA Ao Root diam: 3.40 cm MITRAL VALVE               TRICUSPID VALVE MV Area (PHT): 4.83 cm    TR Peak grad:   40.4 mmHg MV Decel Time: 157 msec    TR Vmax:        318.00 cm/s MV E velocity: 99.00 cm/s MV A velocity: 87.00 cm/s  SHUNTS MV E/A ratio:  1.14        Systemic VTI:  0.25 m                            Systemic Diam: 2.10 cm                            Pulmonic VTI:  0.149 m Amberley Hamler D Emerald Gehres MD Electronically signed by Yolonda Kida MD Signature Date/Time: 04/06/2021/11:32:38 AM    Final      Echo echocardiogram with reasonably preserved left ventricular function around 45 to 50%  TELEMETRY: Sinus rhythm placed intermittent rate of 60:  ASSESSMENT AND PLAN:  Principal Problem:   Pancytopenia ([place]) Active Problems:   CAD (coronary artery disease)   Benign prostatic hyperplasia with urinary obstruction   S/P ICD (internal cardiac defibrillator) procedure   CKD (chronic kidney disease) stage 4, GFR 15-29 ml/min (HCC)   S/P AAA (abdominal aortic aneurysm) repair   Acute anemia   AKI (acute kidney injury) ([place])    Plan Pancytopenia agree with hematology work-up and management try to keep H&H above 8 and 24 Multivessel coronary disease with  PCI and stent in the past Obesity modest recommend weight loss exercise portion control History of congestive heart failure now with systolic dysfunction ejection fraction around  45 to 50% continue current therapy History of AAA repair percutaneously reasonably stable continue current therapy Acute renal insufficiency stable recommend nephrology input Recommend physical therapy to help with strength balance ambulation   Yolonda Kida, MD 04/06/2021 1:29 PM

## 2021-04-06 NOTE — Plan of Care (Signed)
  Problem: Education: Goal: Knowledge of General Education information will improve Description Including pain rating scale, medication(s)/side effects and non-pharmacologic comfort measures Outcome: Progressing   Problem: Health Behavior/Discharge Planning: Goal: Ability to manage health-related needs will improve Outcome: Progressing   

## 2021-04-06 NOTE — Progress Notes (Signed)
°  Transition of Care [facility]) Screening Note   Patient Details  Name: Peter [name] Date of Birth: [birth year]   Transition of Care [facility]) CM/SW Contact:    Alberteen Sam, LCSW Phone Number: 04/06/2021, 10:44 AM    Transition of Care Department [facility]) has reviewed patient and no TOC needs have been identified at this time. We will continue to monitor patient advancement through interdisciplinary progression rounds. If new patient transition needs arise, please place a TOC consult.

## 2021-04-07 DIAGNOSIS — D61818 Other pancytopenia: Secondary | ICD-10-CM | POA: Diagnosis not present

## 2021-04-07 LAB — BASIC METABOLIC PANEL
Anion gap: 6 (ref 5–15)
BUN: 65 mg/dL — ABNORMAL HIGH (ref 8–23)
CO2: 26 mmol/L (ref 22–32)
Calcium: 7.7 mg/dL — ABNORMAL LOW (ref 8.9–10.3)
Chloride: 106 mmol/L (ref 98–111)
Creatinine, Ser: 3.46 mg/dL — ABNORMAL HIGH (ref 0.61–1.24)
GFR, Estimated: 17 mL/min — ABNORMAL LOW (ref >60)
Glucose, Bld: 131 mg/dL — ABNORMAL HIGH (ref 70–99)
Potassium: 3.6 mmol/L (ref 3.5–5.1)
Sodium: 138 mmol/L (ref 135–145)

## 2021-04-07 LAB — CBC
HCT: 32.9 % — ABNORMAL LOW (ref 39.0–52.0)
Hemoglobin: 11 g/dL — ABNORMAL LOW (ref 13.0–17.0)
MCH: 29.4 pg (ref 26.0–34.0)
MCHC: 33.4 g/dL (ref 30.0–36.0)
MCV: 88 fL (ref 80.0–100.0)
Platelets: 94 10*3/uL — ABNORMAL LOW (ref 150–400)
RBC: 3.74 MIL/uL — ABNORMAL LOW (ref 4.22–5.81)
RDW: 14 % (ref 11.5–15.5)
WBC: 8.2 10*3/uL (ref 4.0–10.5)
nRBC: 0 % (ref 0.0–0.2)

## 2021-04-07 LAB — MAGNESIUM: Magnesium: 1.9 mg/dL (ref 1.7–2.4)

## 2021-04-07 NOTE — Progress Notes (Signed)
[facility] Cardiology    SUBJECTIVE: Patient feels much better complains of mild scrotal swelling has much more energy ready to go home   Vitals:   04/06/21 1109 04/06/21 2002 04/06/21 2326 04/07/21 0319  BP: (!) 150/73 (!) 143/82 (!) 156/69 139/73  Pulse: 73 71 69 68  Resp: 18 19 17 18   Temp: 98.1 F (36.7 C) 98.8 F (37.1 C) 97.9 F (36.6 C) 98.3 F (36.8 C)  TempSrc:  Oral Oral Oral  SpO2: 100% 98% 100% 99%  Weight:      Height:         Intake/Output Summary (Last 24 hours) at 04/07/2021 0751 Last data filed at 04/07/2021 0600 Gross per 24 hour  Intake 1560 ml  Output 2225 ml  Net -665 ml      PHYSICAL EXAM  General: Well developed, well nourished, in no acute distress HEENT:  Normocephalic and atramatic Neck:  No JVD.  Lungs: Clear bilaterally to auscultation and percussion. Heart: HRRR . Normal S1 and S2 without gallops or murmurs.  Abdomen: Bowel sounds are positive, abdomen soft and non-tender  Msk:  Back normal, normal gait. Normal strength and tone for age. Extremities: No clubbing, cyanosis or edema.   Neuro: Alert and oriented X 3. Psych:  Good affect, responds appropriately   LABS: Basic Metabolic Panel: Recent Labs    04/06/21 0512 04/07/21 0505  NA 140 138  K 3.6 3.6  CL 107 106  CO2 26 26  GLUCOSE 91 131*  BUN 58* 65*  CREATININE 2.87* 3.46*  CALCIUM 8.1* 7.7*  MG 1.9 1.9   Liver Function Tests: No results for input(s): AST, ALT, ALKPHOS, BILITOT, PROT, ALBUMIN in the last 72 hours. No results for input(s): LIPASE, AMYLASE in the last 72 hours. CBC: Recent Labs    04/04/21 1036 04/05/21 0846 04/06/21 0512 04/07/21 0505  WBC 11.8*   < > 7.7 8.2  NEUTROABS 8.5*  --   --   --   HGB 11.9*   < > 11.7* 11.0*  HCT 35.9*   < > 35.2* 32.9*  MCV 89.5   < > 88.2 88.0  PLT 115*   < > 104* 94*   < > = values in this interval not displayed.   Cardiac Enzymes: No results for input(s): CKTOTAL, CKMB, CKMBINDEX, TROPONINI in the last 72  hours. BNP: Invalid input(s): POCBNP D-Dimer: No results for input(s): DDIMER in the last 72 hours. Hemoglobin A1C: No results for input(s): HGBA1C in the last 72 hours. Fasting Lipid Panel: No results for input(s): CHOL, HDL, LDLCALC, TRIG, CHOLHDL, LDLDIRECT in the last 72 hours. Thyroid Function Tests: No results for input(s): TSH, T4TOTAL, T3FREE, THYROIDAB in the last 72 hours.  Invalid input(s): FREET3 Anemia Panel: Recent Labs    04/04/21 1036  RETICCTPCT 1.0    No results found.   Echo preserved left ventricular function EF of around 50%  TELEMETRY: Paced rhythm:  ASSESSMENT AND PLAN:  Principal Problem:   Pancytopenia ([place]) Active Problems:   CAD (coronary artery disease)   Benign prostatic hyperplasia with urinary obstruction   S/P ICD (internal cardiac defibrillator) procedure   CKD (chronic kidney disease) stage 4, GFR 15-29 ml/min (HCC)   S/P AAA (abdominal aortic aneurysm) repair   Acute anemia   AKI (acute kidney injury) ([place])    Plan Continue diuresis for heart failure Agree with transfusion for pancytopenia Recommend outpatient follow-up with hematology Renal insufficiency advised follow-up with nephrology as an outpatient AICD in place stable follow-up  device clinic Multivessel coronary disease reasonably stable continue medical therapy denies any angina Recommend physical therapy as outpatient Consider home health as outpatient Follow-up with cardiology 2 to 4 weeks as outpatient   Yolonda Kida, MD 04/07/2021 7:51 AM

## 2021-04-07 NOTE — Progress Notes (Signed)
PROGRESS NOTE    Peter [name]  TMH:[id number] DOB: 11/06/1940 DOA: 04/03/2021 PCP: Tracie Harrier, MD  [id number]   Assessment & Plan:   Principal Problem:   Pancytopenia ([employer]) Active Problems:   CAD (coronary artery disease)   Benign prostatic hyperplasia with urinary obstruction   S/P ICD (internal cardiac defibrillator) procedure   CKD (chronic kidney disease) stage 4, GFR 15-29 ml/min (HCC)   S/P AAA (abdominal aortic aneurysm) repair   Acute anemia   AKI (acute kidney injury) ([place])   Peter [name] is a 80 y.o. male with medical history significant for coronary artery disease status post PCI with stent angioplasty, hypertension, anemia of chronic disease, chronic diastolic dysfunction CHF who presents to the ER for evaluation of several weeks of shortness of breath associated with bilateral lower extremity swelling. Patient states that he had a respiratory infection around Thanksgiving from RSV and influenza and has had difficulty breathing since then.  He notes bilateral lower extremity swelling and insomnia which he attributes to not being able to breathe when he lays down.    Pancytopenia, ruled out  Anemia --hemoglobin of 5.7 on presentation likely a lab error, since Hgb went up to 12.1 with just 2u pRBC.  Thrombocytopenia --initial low 65 also likely due to lab error.  Up to 114 next day.  Elevated troponin likely due to demand ischemia --trop mid 200's, flat.  No chest pain.  history of coronary artery disease --Plavix held on admission due to concern of worsening anemia, since resumed --cont plavix Continue statins, nitrates and carvedilol  Acute on chronic diastolic dysfunction CHF Last known LVEF was 50% from 09/22 --cardiology consulted, rec diuresis. --hold diuresis due to Cr increase  AKI vs progression of stage III chronic kidney disease At baseline patient has a serum creatinine of 2.3 but on admission it is 2.95.  Maybe cardio-renal,  since Cr actually improved a bit after IV lasix given in the ED. Plan: --hold diuresis due to Cr increase --hold Cozaar  BPH Continue Flomax and finasteride   Hypertension --cont amlodipine and coreg --hold Cozaar and diuresis due to Cr increase   DVT prophylaxis: Lovenox SQ Code Status: Full code  Family Communication:   Level of care: Progressive Dispo:   The patient is from: home Anticipated d/c is to: home Anticipated d/c date is: 1-2 days Patient currently is not medically ready to d/c due to: Cr increase   Subjective and Interval History:  Reported feeling better.  Good oral intake.  Pt asked about going to rehab or having someone help take care of him, however, pt can ambulate on his own.     Objective: Vitals:   04/07/21 0915 04/07/21 1032 04/07/21 1112 04/07/21 1517  BP: (!) 166/57  (!) 157/64 (!) 159/50  Pulse:   (!) 59 64  Resp: 17  18 18   Temp: 98.3 F (36.8 C)  98.2 F (36.8 C) 98.1 F (36.7 C)  TempSrc: Oral  Oral   SpO2: 99%  96% 99%  Weight:  93.1 kg    Height:        Intake/Output Summary (Last 24 hours) at 04/07/2021 2056 Last data filed at 04/07/2021 1730 Gross per 24 hour  Intake 1200 ml  Output 2050 ml  Net -850 ml   Filed Weights   04/03/21 2221 04/05/21 2341 04/07/21 1032  Weight: 99.8 kg 95.3 kg 93.1 kg    Examination:   Constitutional: NAD, AAOx3 HEENT: conjunctivae and lids normal, EOMI CV: No cyanosis.  RESP: normal respiratory effort, on RA Extremities: improved edema in BLE SKIN: warm, dry, extensive bruising on both arms. Neuro: II - XII grossly intact.   Psych: Normal mood and affect.  Appropriate judgement and reason   Data Reviewed: I have personally reviewed following labs and imaging studies  CBC: Recent Labs  Lab 04/03/21 2223 04/04/21 1036 04/05/21 0846 04/06/21 0512 04/07/21 0505  WBC 4.9 11.8* 8.5 7.7 8.2  NEUTROABS  --  8.5*  --   --   --   HGB 5.7* 11.9* 12.1* 11.7* 11.0*  HCT 18.0* 35.9* 36.0*  35.2* 32.9*  MCV 93.8 89.5 87.4 88.2 88.0  PLT 65* 115* 114* 104* 94*   Basic Metabolic Panel: Recent Labs  Lab 04/03/21 2223 04/04/21 0025 04/05/21 0846 04/06/21 0512 04/07/21 0505  NA 138  --  140 140 138  K 4.6  --  3.8 3.6 3.6  CL 111  --  111 107 106  CO2 23  --  25 26 26   GLUCOSE 152*  --  103* 91 131*  BUN 67*  --  63* 58* 65*  CREATININE 2.95*  --  2.84* 2.87* 3.46*  CALCIUM 7.5*  --  7.8* 8.1* 7.7*  MG  --  1.7  --  1.9 1.9   GFR: Estimated Creatinine Clearance: 19.5 mL/min (A) (by C-G formula based on SCr of 3.46 mg/dL (H)). Liver Function Tests: Recent Labs  Lab 04/04/21 0025  AST 14*  ALT 30  ALKPHOS 73  BILITOT 0.9  PROT 5.0*  ALBUMIN 2.9*   Recent Labs  Lab 04/04/21 0025  LIPASE 42   No results for input(s): AMMONIA in the last 168 hours. Coagulation Profile: Recent Labs  Lab 04/04/21 0025  INR 1.1   Cardiac Enzymes: No results for input(s): CKTOTAL, CKMB, CKMBINDEX, TROPONINI in the last 168 hours. BNP (last 3 results) No results for input(s): PROBNP in the last 8760 hours. HbA1C: No results for input(s): HGBA1C in the last 72 hours. CBG: No results for input(s): GLUCAP in the last 168 hours. Lipid Profile: No results for input(s): CHOL, HDL, LDLCALC, TRIG, CHOLHDL, LDLDIRECT in the last 72 hours. Thyroid Function Tests: No results for input(s): TSH, T4TOTAL, FREET4, T3FREE, THYROIDAB in the last 72 hours.  Anemia Panel: No results for input(s): VITAMINB12, FOLATE, FERRITIN, TIBC, IRON, RETICCTPCT in the last 72 hours.  Sepsis Labs: No results for input(s): PROCALCITON, LATICACIDVEN in the last 168 hours.  Recent Results (from the past 240 hour(s))  Resp Panel by RT-PCR (Flu A&B, Covid) Nasopharyngeal Swab     Status: None   Collection Time: 04/04/21  4:23 AM   Specimen: Nasopharyngeal Swab; Nasopharyngeal(NP) swabs in vial transport medium  Result Value Ref Range Status   SARS Coronavirus 2 by RT PCR NEGATIVE NEGATIVE Final     Comment: (NOTE) SARS-CoV-2 target nucleic acids are NOT DETECTED.  The SARS-CoV-2 RNA is generally detectable in upper respiratory specimens during the acute phase of infection. The lowest concentration of SARS-CoV-2 viral copies this assay can detect is 138 copies/mL. A negative result does not preclude SARS-Cov-2 infection and should not be used as the sole basis for treatment or other patient management decisions. A negative result may occur with  improper specimen collection/handling, submission of specimen other than nasopharyngeal swab, presence of viral mutation(s) within the areas targeted by this assay, and inadequate number of viral copies(<138 copies/mL). A negative result must be combined with clinical observations, patient history, and epidemiological information. The expected result is Negative.  Fact Sheet for Patients:  EntrepreneurPulse.com.au  Fact Sheet for Healthcare Providers:  IncredibleEmployment.be  This test is no t yet approved or cleared by the [place] FDA and  has been authorized for detection and/or diagnosis of SARS-CoV-2 by FDA under an Emergency Use Authorization (EUA). This EUA will remain  in effect (meaning this test can be used) for the duration of the COVID-19 declaration under Section 564(b)(1) of the Act, 21 U.S.C.section 360bbb-3(b)(1), unless the authorization is terminated  or revoked sooner.       Influenza A by PCR NEGATIVE NEGATIVE Final   Influenza B by PCR NEGATIVE NEGATIVE Final    Comment: (NOTE) The Xpert Xpress SARS-CoV-2/FLU/RSV plus assay is intended as an aid in the diagnosis of influenza from Nasopharyngeal swab specimens and should not be used as a sole basis for treatment. Nasal washings and aspirates are unacceptable for Xpert Xpress SARS-CoV-2/FLU/RSV testing.  Fact Sheet for Patients: EntrepreneurPulse.com.au  Fact Sheet for Healthcare  Providers: IncredibleEmployment.be  This test is not yet approved or cleared by the [place] FDA and has been authorized for detection and/or diagnosis of SARS-CoV-2 by FDA under an Emergency Use Authorization (EUA). This EUA will remain in effect (meaning this test can be used) for the duration of the COVID-19 declaration under Section 564(b)(1) of the Act, 21 U.S.C. section 360bbb-3(b)(1), unless the authorization is terminated or revoked.  Performed at [facility], [address] [place], [place] [address]       Radiology Studies: No results found.   Scheduled Meds:  amLODipine  10 mg Oral Daily   atorvastatin  40 mg Oral q1800   carvedilol  6.25 mg Oral BID WC   clopidogrel  75 mg Oral Daily   enoxaparin (LOVENOX) injection  30 mg Subcutaneous Q24H   finasteride  5 mg Oral Daily   hydrALAZINE  50 mg Oral Q8H   isosorbide mononitrate  30 mg Oral Daily   pantoprazole  40 mg Oral Daily   sodium chloride flush  3 mL Intravenous Q12H   tamsulosin  0.4 mg Oral Daily   cyanocobalamin  1,000 mcg Oral Daily   Continuous Infusions:  sodium chloride       LOS: 3 days     Enzo Bi, MD [facility] If 7PM-7AM, please contact night-coverage 04/07/2021, 8:56 PM

## 2021-04-07 NOTE — Care Management Important Message (Signed)
Important Message  Patient Details  Name: Peter [name] MRN: [id number] Date of Birth: 11-06-1940   Medicare Important Message Given:  Yes     [name] 04/07/2021, 1:17 PM

## 2021-04-08 ENCOUNTER — Inpatient Hospital Stay: Payer: PPO

## 2021-04-08 DIAGNOSIS — D61818 Other pancytopenia: Secondary | ICD-10-CM | POA: Diagnosis not present

## 2021-04-08 LAB — BASIC METABOLIC PANEL
Anion gap: 8 (ref 5–15)
BUN: 59 mg/dL — ABNORMAL HIGH (ref 8–23)
CO2: 27 mmol/L (ref 22–32)
Calcium: 7.8 mg/dL — ABNORMAL LOW (ref 8.9–10.3)
Chloride: 108 mmol/L (ref 98–111)
Creatinine, Ser: 3.42 mg/dL — ABNORMAL HIGH (ref 0.61–1.24)
GFR, Estimated: 17 mL/min — ABNORMAL LOW (ref >60)
Glucose, Bld: 94 mg/dL (ref 70–99)
Potassium: 3.6 mmol/L (ref 3.5–5.1)
Sodium: 143 mmol/L (ref 135–145)

## 2021-04-08 LAB — URINALYSIS, COMPLETE (UACMP) WITH MICROSCOPIC
Bacteria, UA: NONE SEEN
Bilirubin Urine: NEGATIVE
Glucose, UA: NEGATIVE mg/dL
Hgb urine dipstick: NEGATIVE
Ketones, ur: NEGATIVE mg/dL
Leukocytes,Ua: NEGATIVE
Nitrite: NEGATIVE
Protein, ur: >=300 mg/dL — AB
Specific Gravity, Urine: 1.014 (ref 1.005–1.030)
Squamous Epithelial / HPF: NONE SEEN (ref 0–5)
pH: 6 (ref 5.0–8.0)

## 2021-04-08 LAB — CBC
HCT: 34.7 % — ABNORMAL LOW (ref 39.0–52.0)
Hemoglobin: 11.1 g/dL — ABNORMAL LOW (ref 13.0–17.0)
MCH: 28.7 pg (ref 26.0–34.0)
MCHC: 32 g/dL (ref 30.0–36.0)
MCV: 89.7 fL (ref 80.0–100.0)
Platelets: 85 10*3/uL — ABNORMAL LOW (ref 150–400)
RBC: 3.87 MIL/uL — ABNORMAL LOW (ref 4.22–5.81)
RDW: 13.9 % (ref 11.5–15.5)
WBC: 7.6 10*3/uL (ref 4.0–10.5)
nRBC: 0 % (ref 0.0–0.2)

## 2021-04-08 LAB — ALBUMIN: Albumin: 2.8 g/dL — ABNORMAL LOW (ref 3.5–5.0)

## 2021-04-08 LAB — PROTEIN / CREATININE RATIO, URINE
Creatinine, Urine: 103 mg/dL
Protein Creatinine Ratio: 3.93 mg/mg{Cre} — ABNORMAL HIGH (ref 0.00–0.15)
Total Protein, Urine: 405 mg/dL

## 2021-04-08 LAB — MAGNESIUM: Magnesium: 1.8 mg/dL (ref 1.7–2.4)

## 2021-04-08 MED ORDER — HYDRALAZINE HCL 50 MG PO TABS
100.0000 mg | ORAL_TABLET | Freq: Three times a day (TID) | ORAL | Status: DC
  Administered 2021-04-08 – 2021-04-09 (×3): 100 mg via ORAL
  Filled 2021-04-08 (×3): qty 2

## 2021-04-08 MED ORDER — LOSARTAN POTASSIUM 100 MG PO TABS: ORAL_TABLET | ORAL | Status: DC

## 2021-04-08 MED ORDER — FUROSEMIDE 20 MG PO TABS: ORAL_TABLET | ORAL | Status: DC

## 2021-04-08 MED ORDER — HYDRALAZINE HCL 100 MG PO TABS: 100.0000 mg | ORAL_TABLET | Freq: Three times a day (TID) | ORAL | 0 refills | Status: DC

## 2021-04-08 MED ORDER — SPIRONOLACTONE 25 MG PO TABS: ORAL_TABLET | ORAL | Status: DC

## 2021-04-08 NOTE — Consult Note (Signed)
[facility]  CONSULT NOTE    Date: 04/08/2021                  Patient Name:  Peter [name]  MRN: [id number]  DOB: February 07, 1941  Age / Sex: 80 y.o., male         PCP: Tracie Harrier, MD                 Service Requesting Consult: [place]                 Reason for Consult: Acute kidney injury            History of Present Illness: Peter [name] is a 81 y.o.  male with past medical history of CAD, hypertension, hyperlipidemia, CHF, and chronic kidney disease, who was admitted to [facility] on 04/03/2021 for CHF (congestive heart failure) ([name]) [I50.9] Acute on chronic systolic congestive heart failure ([place]) [I50.23] Symptomatic anemia [D64.9]  Patient resting in bed. Currently does not follow with a nephrologist outpatient. Primary care physician notified him of kidney failure and has been following. Denies nausea, vomiting, and diarrhea. Has had loss of appetite. States he has had abdominal swelling and shortness of breath, which has brought him to hospital  Room air at home but feels short of breath at times with activity. Denies NSAID use. Prescribed Lasix 20 mg orally daily, but has not been taking it.   Labs consist of creatinine 3.42 with GFR 17 today. Baseline appears to be creatinine 2.3 and GFR 23 on Sept 6, 2022. It has fluctuated during this admission with use of diuretics. We have been consulted to evaluate acute kidney injury.   Medications: Outpatient medications: Medications Prior to Admission  Medication Sig Dispense Refill Last Dose   amLODipine (NORVASC) 10 MG tablet Take 10 mg by mouth daily.      atorvastatin (LIPITOR) 40 MG tablet Take 1 tablet (40 mg total) by mouth daily at 6 PM. 30 tablet 0    carvedilol (COREG) 6.25 MG tablet Take 6.25 mg by mouth 2 (two) times daily with a meal.      clopidogrel (PLAVIX) 75 MG tablet TAKE 1 TABLET(75 MG) BY MOUTH EVERY DAY 30 tablet 1    isosorbide mononitrate (IMDUR) 30 MG 24 hr tablet Take 30 mg  by mouth daily.      omeprazole (PRILOSEC) 20 MG capsule Take by mouth.      tamsulosin (FLOMAX) 0.4 MG CAPS capsule Take 1 capsule (0.4 mg total) by mouth daily. 90 capsule 3    [DISCONTINUED] furosemide (LASIX) 20 MG tablet Take 20 mg by mouth daily.      [DISCONTINUED] losartan (COZAAR) 100 MG tablet Take 100 mg by mouth daily.      [DISCONTINUED] spironolactone (ALDACTONE) 25 MG tablet Take 6.25 mg by mouth daily.      cetirizine (ZYRTEC) 10 MG tablet Take 10 mg by mouth.      clobetasol cream (TEMOVATE) 0.05 % APPLY TO PSORIASIS BID PRN UNTIL SMOOTH  1    cyanocobalamin 1000 MCG tablet Take 1,000 mcg by mouth daily.       finasteride (PROSCAR) 5 MG tablet Take 1 tablet (5 mg total) by mouth daily. 90 tablet 3    GLUCOSAMINE-CHONDROITIN-VIT C PO Take 1 tablet by mouth daily.      predniSONE (DELTASONE) 10 MG tablet Take 1 tablet by mouth daily. 6, 5, 4, 3, 2, 1       Current medications: Current  Facility-Administered Medications  Medication Dose Route Frequency Provider Last Rate Last Admin   0.9 %  sodium chloride infusion  250 mL Intravenous PRN Agbata, Tochukwu, MD       acetaminophen (TYLENOL) tablet 650 mg  650 mg Oral Q6H PRN Agbata, Tochukwu, MD       Or   acetaminophen (TYLENOL) suppository 650 mg  650 mg Rectal Q6H PRN Agbata, Tochukwu, MD       atorvastatin (LIPITOR) tablet 40 mg  40 mg Oral q1800 Agbata, Tochukwu, MD   40 mg at 04/07/21 1728   carvedilol (COREG) tablet 6.25 mg  6.25 mg Oral BID WC Agbata, Tochukwu, MD   6.25 mg at 04/08/21 0817   clopidogrel (PLAVIX) tablet 75 mg  75 mg Oral Daily Enzo Bi, MD   75 mg at 04/08/21 0817   enoxaparin (LOVENOX) injection 30 mg  30 mg Subcutaneous Q24H Agbata, Tochukwu, MD   30 mg at 04/08/21 0818   finasteride (PROSCAR) tablet 5 mg  5 mg Oral Daily Agbata, Tochukwu, MD   5 mg at 04/08/21 0817   hydrALAZINE (APRESOLINE) injection 10 mg  10 mg Intravenous Q6H PRN Agbata, Tochukwu, MD   10 mg at 04/05/21 0602   hydrALAZINE  (APRESOLINE) tablet 100 mg  100 mg Oral Q8H Enzo Bi, MD   100 mg at 04/08/21 1340   isosorbide mononitrate (IMDUR) 24 hr tablet 30 mg  30 mg Oral Daily Agbata, Tochukwu, MD   30 mg at 04/08/21 0815   ondansetron (ZOFRAN) tablet 4 mg  4 mg Oral Q6H PRN Agbata, Tochukwu, MD       Or   ondansetron (ZOFRAN) injection 4 mg  4 mg Intravenous Q6H PRN Agbata, Tochukwu, MD       pantoprazole (PROTONIX) EC tablet 40 mg  40 mg Oral Daily Agbata, Tochukwu, MD   40 mg at 04/08/21 0818   sodium chloride flush (NS) 0.9 % injection 3 mL  3 mL Intravenous Q12H Agbata, Tochukwu, MD   3 mL at 04/08/21 0819   sodium chloride flush (NS) 0.9 % injection 3 mL  3 mL Intravenous PRN Agbata, Tochukwu, MD       tamsulosin (FLOMAX) capsule 0.4 mg  0.4 mg Oral Daily Agbata, Tochukwu, MD   0.4 mg at 04/08/21 1194   vitamin B-12 (CYANOCOBALAMIN) tablet 1,000 mcg  1,000 mcg Oral Daily Agbata, Tochukwu, MD   1,000 mcg at 04/08/21 0815      Allergies: Allergies  Allergen Reactions   Iodinated Contrast Media Shortness Of Breath   Iodinated Glycerol  [Glycerol, Iodinated] Shortness Of Breath      Past Medical History: Past Medical History:  Diagnosis Date   Anginal pain ([place])    Asthma    Bladder infection, acute 03/2011   "had a whole lot of bleeding from this"   CHF (congestive heart failure) ([place])    Coronary artery disease    High cholesterol    Hypertension    Macular degeneration 12/14/2011   "had it in my right; getting shots now in my left"   Myocardial infarction [facility]) 1996   NSTEMI (non-ST elevated myocardial infarction) ([place]) 12/14/2011   Shortness of breath 12/14/2011   "@ rest, lying down, w/exertion"     Past Surgical History: Past Surgical History:  Procedure Laterality Date   [place]; ~ 2003; ~ 2008   "total of 3"   CORONARY STENT INTERVENTION N/A 08/10/2016   Procedure: Coronary Stent Intervention;  Surgeon: [name], MD;  Location: [place] [facility];  Service: Cardiovascular;  Laterality: N/A;   HERNIA REPAIR  ~ 2001   "abdominal w/mesh implanted"   LEFT HEART CATH AND CORONARY ANGIOGRAPHY N/A 08/10/2016   Procedure: Left Heart Cath and Coronary Angiography;  Surgeon: [name], MD;  Location: [place] [facility];  Service: Cardiovascular;  Laterality: N/A;   LEFT HEART CATHETERIZATION WITH CORONARY ANGIOGRAM N/A 12/16/2011   Procedure: LEFT HEART CATHETERIZATION WITH CORONARY ANGIOGRAM;  Surgeon: Minus Breeding, MD;  Location: [facility] CATH LAB;  Service: Cardiovascular;  Laterality: N/A;   PERCUTANEOUS CORONARY STENT INTERVENTION (PCI-S) N/A 12/17/2011   Procedure: PERCUTANEOUS CORONARY STENT INTERVENTION (PCI-S);  Surgeon: Sherren Mocha, MD;  Location: [facility] CATH LAB;  Service: Cardiovascular;  Laterality: N/A;   TONSILLECTOMY     "I was a kid"     Family History: Family History  Family history unknown: Yes     Social History: Social History   Socioeconomic History   Marital status: Widowed    Spouse name: Not on file   Number of children: Not on file   Years of education: Not on file   Highest education level: Not on file  Occupational History   Not on file  Tobacco Use   Smoking status: Former    Packs/day: 0.50    Years: 0.50    Pack years: 0.25    Types: Cigarettes   Smokeless tobacco: Never   Tobacco comments:    11/23/16 Still not ready to quit smoking.  Substance and Sexual Activity   Alcohol use: Yes    Alcohol/week: 1.0 standard drink    Types: 1 Cans of beer per week    Comment: 12/14/2011 "if my kidneys are sore, I drink 1 beer/day; if not sore; no beer; occasionally drink socially; ave 1 beer/wk maybe"   Drug use: No   Sexual activity: Not Currently  Other Topics Concern   Not on file  Social History Narrative   Not on file   Social Determinants of Health   Financial Resource Strain: Not on file  Food Insecurity: Not on file  Transportation Needs: Not on file  Physical Activity:  Not on file  Stress: Not on file  Social Connections: Not on file  Intimate Partner Violence: Not on file     Review of Systems: Review of Systems  Constitutional:  Negative for chills, fever and malaise/fatigue.  HENT:  Negative for congestion, sore throat and tinnitus.   Eyes:  Negative for blurred vision and redness.  Respiratory:  Positive for shortness of breath. Negative for cough and wheezing.   Cardiovascular:  Positive for leg swelling. Negative for chest pain, palpitations and claudication.  Gastrointestinal:  Negative for abdominal pain, blood in stool, diarrhea, nausea and vomiting.  Genitourinary:  Negative for flank pain, frequency and hematuria.  Musculoskeletal:  Negative for back pain, falls and myalgias.  Skin:  Negative for rash.  Neurological:  Negative for dizziness, weakness and headaches.  Endo/Heme/Allergies:  Does not bruise/bleed easily.  Psychiatric/Behavioral:  Negative for depression. The patient is not nervous/anxious and does not have insomnia.    Vital Signs: Blood pressure (!) 156/55, pulse 84, temperature 98.2 F (36.8 C), resp. rate 18, height 5\' 11"  (1.803 m), weight 93 kg, SpO2 97 %.  Weight trends: Filed Weights   04/05/21 2341 04/07/21 1032 04/08/21 0807  Weight: 95.3 kg 93.1 kg 93 kg    Physical Exam: General: NAD, resting in bed  Head: Normocephalic, atraumatic. Moist  oral mucosal membranes  Eyes: Anicteric  Lungs:  Clear to auscultation, normal effort  Heart: Regular rate and rhythm  Abdomen:  Soft, nontender, nondistended  Extremities:  1+ peripheral edema.  Neurologic: Nonfocal, moving all four extremities  Skin: No lesions        Lab results: Basic Metabolic Panel: Recent Labs  Lab 04/04/21 0025 04/05/21 0846 04/06/21 0512 04/07/21 0505 04/08/21 0457  NA  --    < > 140 138 143  K  --    < > 3.6 3.6 3.6  CL  --    < > 107 106 108  CO2  --    < > 26 26 27   GLUCOSE  --    < > 91 131* 94  BUN  --    < > 58* 65* 59*   CREATININE  --    < > 2.87* 3.46* 3.42*  CALCIUM  --    < > 8.1* 7.7* 7.8*  MG 1.7  --  1.9 1.9 1.8   < > = values in this interval not displayed.    Liver Function Tests: Recent Labs  Lab 04/04/21 0025  AST 14*  ALT 30  ALKPHOS 73  BILITOT 0.9  PROT 5.0*  ALBUMIN 2.9*   Recent Labs  Lab 04/04/21 0025  LIPASE 42   No results for input(s): AMMONIA in the last 168 hours.  CBC: Recent Labs  Lab 04/04/21 1036 04/05/21 0846 04/06/21 0512 04/07/21 0505 04/08/21 0457  WBC 11.8* 8.5 7.7 8.2 7.6  NEUTROABS 8.5*  --   --   --   --   HGB 11.9* 12.1* 11.7* 11.0* 11.1*  HCT 35.9* 36.0* 35.2* 32.9* 34.7*  MCV 89.5 87.4 88.2 88.0 89.7  PLT 115* 114* 104* 94* 85*    Cardiac Enzymes: No results for input(s): CKTOTAL, CKMB, CKMBINDEX, TROPONINI in the last 168 hours.  BNP: Invalid input(s): POCBNP  CBG: No results for input(s): GLUCAP in the last 168 hours.  Microbiology: Results for orders placed or performed during the hospital encounter of 04/03/21  Resp Panel by RT-PCR (Flu A&B, Covid) Nasopharyngeal Swab     Status: None   Collection Time: 04/04/21  4:23 AM   Specimen: Nasopharyngeal Swab; Nasopharyngeal(NP) swabs in vial transport medium  Result Value Ref Range Status   SARS Coronavirus 2 by RT PCR NEGATIVE NEGATIVE Final    Comment: (NOTE) SARS-CoV-2 target nucleic acids are NOT DETECTED.  The SARS-CoV-2 RNA is generally detectable in upper respiratory specimens during the acute phase of infection. The lowest concentration of SARS-CoV-2 viral copies this assay can detect is 138 copies/mL. A negative result does not preclude SARS-Cov-2 infection and should not be used as the sole basis for treatment or other patient management decisions. A negative result may occur with  improper specimen collection/handling, submission of specimen other than nasopharyngeal swab, presence of viral mutation(s) within the areas targeted by this assay, and inadequate number of  viral copies(<138 copies/mL). A negative result must be combined with clinical observations, patient history, and epidemiological information. The expected result is Negative.  Fact Sheet for Patients:  EntrepreneurPulse.com.au  Fact Sheet for Healthcare Providers:  IncredibleEmployment.be  This test is no t yet approved or cleared by the [place] FDA and  has been authorized for detection and/or diagnosis of SARS-CoV-2 by FDA under an Emergency Use Authorization (EUA). This EUA will remain  in effect (meaning this test can be used) for the duration of the COVID-19 declaration under Section 564(b)(1)  of the Act, 21 U.S.C.section 360bbb-3(b)(1), unless the authorization is terminated  or revoked sooner.       Influenza A by PCR NEGATIVE NEGATIVE Final   Influenza B by PCR NEGATIVE NEGATIVE Final    Comment: (NOTE) The Xpert Xpress SARS-CoV-2/FLU/RSV plus assay is intended as an aid in the diagnosis of influenza from Nasopharyngeal swab specimens and should not be used as a sole basis for treatment. Nasal washings and aspirates are unacceptable for Xpert Xpress SARS-CoV-2/FLU/RSV testing.  Fact Sheet for Patients: EntrepreneurPulse.com.au  Fact Sheet for Healthcare Providers: IncredibleEmployment.be  This test is not yet approved or cleared by the [place] FDA and has been authorized for detection and/or diagnosis of SARS-CoV-2 by FDA under an Emergency Use Authorization (EUA). This EUA will remain in effect (meaning this test can be used) for the duration of the COVID-19 declaration under Section 564(b)(1) of the Act, 21 U.S.C. section 360bbb-3(b)(1), unless the authorization is terminated or revoked.  Performed at [facility], [address] [place], [place] [address]     Coagulation Studies: No results for input(s): LABPROT, INR in the last 72 hours.  Urinalysis: No  results for input(s): COLORURINE, LABSPEC, PHURINE, GLUCOSEU, HGBUR, BILIRUBINUR, KETONESUR, PROTEINUR, UROBILINOGEN, NITRITE, LEUKOCYTESUR in the last 72 hours.  Invalid input(s): APPERANCEUR    Imaging: No results found.   Assessment & Plan: Peter [name] is a 80 y.o.  male with past medical history of CAD, hypertension, hyperlipidemia, CHF, and chronic kidney disease, who was admitted to [facility] on 04/03/2021 for CHF (congestive heart failure) ([place]) [I50.9] Acute on chronic systolic congestive heart failure ([place]) [I50.23] Symptomatic anemia [D64.9]  Acute kidney injury on chronic kidney disease stage IV with baseline 2.3 and GFR 23 on 12/17/20. Acute kidney injury likely due to overdiuresis vs cardiorenal syndrome. Agree with held diuresis.No IV contrast exposure. Losartan held.Renal ultrasound ordered to evaluate obstruction. Will order other serologies: Hepatitis B, Kappa light, UPEP, SPEP, UV, Urine creatinine ratio. Will need outpatient follow up with our office at discharge.  2. Hypertension with chronic kidney disease. Home regimen includes amlodipine, carvedilol, isosorbide, furosemide and losartan. Losartan and furosemide held. BP currently 156/55.  3. Chronic diastolic heart failure. ECHO from 04/04/21 shows EF 50-55% with mild LVH and Grade II diastolic dysfunction. Follows Dr Clayborn Bigness.     LOS: [place] 12/27/20223:11 PM

## 2021-04-08 NOTE — Progress Notes (Signed)
[facility] Cardiology    SUBJECTIVE: Patient states he feels much improved no significant leg swelling improved shortness of breath fatigue has improved anemia has improved renal function is still worse than originally admitted we will continue to follow with nephrology input   Vitals:   04/07/21 1112 04/07/21 1517 04/07/21 2347 04/08/21 0343  BP: (!) 157/64 (!) 159/50 (!) 160/65 (!) 163/73  Pulse: (!) 59 64 60 (!) 58  Resp: 18 18 18 18   Temp: 98.2 F (36.8 C) 98.1 F (36.7 C) 98 F (36.7 C) 98 F (36.7 C)  TempSrc: Oral     SpO2: 96% 99% 96% 96%  Weight:      Height:         Intake/Output Summary (Last 24 hours) at 04/08/2021 0700 Last data filed at 04/08/2021 0600 Gross per 24 hour  Intake 720 ml  Output 1550 ml  Net -830 ml      PHYSICAL EXAM  General: Well developed, well nourished, in no acute distress HEENT:  Normocephalic and atramatic Neck:  No JVD.  Lungs: Clear bilaterally to auscultation and percussion. Heart: HRRR . Normal S1 and S2 without gallops or murmurs.  Abdomen: Bowel sounds are positive, abdomen soft and non-tender  Msk:  Back normal, normal gait. Normal strength and tone for age. Extremities: No clubbing, cyanosis or edema.   Neuro: Alert and oriented X 3. Psych:  Good affect, responds appropriately   LABS: Basic Metabolic Panel: Recent Labs    04/06/21 0512 04/07/21 0505  NA 140 138  K 3.6 3.6  CL 107 106  CO2 26 26  GLUCOSE 91 131*  BUN 58* 65*  CREATININE 2.87* 3.46*  CALCIUM 8.1* 7.7*  MG 1.9 1.9   Liver Function Tests: No results for input(s): AST, ALT, ALKPHOS, BILITOT, PROT, ALBUMIN in the last 72 hours. No results for input(s): LIPASE, AMYLASE in the last 72 hours. CBC: Recent Labs    04/07/21 0505 04/08/21 0457  WBC 8.2 7.6  HGB 11.0* 11.1*  HCT 32.9* 34.7*  MCV 88.0 89.7  PLT 94* 85*   Cardiac Enzymes: No results for input(s): CKTOTAL, CKMB, CKMBINDEX, TROPONINI in the last 72 hours. BNP: Invalid input(s):  POCBNP D-Dimer: No results for input(s): DDIMER in the last 72 hours. Hemoglobin A1C: No results for input(s): HGBA1C in the last 72 hours. Fasting Lipid Panel: No results for input(s): CHOL, HDL, LDLCALC, TRIG, CHOLHDL, LDLDIRECT in the last 72 hours. Thyroid Function Tests: No results for input(s): TSH, T4TOTAL, T3FREE, THYROIDAB in the last 72 hours.  Invalid input(s): FREET3 Anemia Panel: No results for input(s): VITAMINB12, FOLATE, FERRITIN, TIBC, IRON, RETICCTPCT in the last 72 hours.  No results found.   Echo preserved left ventricular function around 50%  TELEMETRY: Normal sinus rhythm rate of around 60:  ASSESSMENT AND PLAN:  Principal Problem:   Pancytopenia ([place]) Active Problems:   CAD (coronary artery disease)   Benign prostatic hyperplasia with urinary obstruction   S/P ICD (internal cardiac defibrillator) procedure   CKD (chronic kidney disease) stage 4, GFR 15-29 ml/min (HCC)   S/P AAA (abdominal aortic aneurysm) repair   Acute anemia   AKI (acute kidney injury) ([place])    Plan Acute on chronic renal insufficiency elevated creatinine and reduced GFR down to 17 continue nephrology input Multivessel coronary disease reasonably stable continue medical therapy Congestive heart failure compensated improved EF up to about 50% on Entresto diuretics AICD in place no discharges continue to follow-up with device clinic 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

## 2021-04-08 NOTE — TOC Initial Note (Signed)
Transition of Care [facility]) - Initial/Assessment Note    Patient Details  Name: Peter [name] MRN: [id number] Date of Birth: 06/15/1940  [facility] CM/SW Contact:    Alberteen Sam, LCSW Phone Number: 04/08/2021, 1:17 PM  Clinical Narrative:                  CSW Spoke with patient over the phone due to working remote.   Patient reports coming from home alone, states he's interested in PT follow up upon his discharge. Patient reports he's unsure if he would like home health PT or outpatient PT with more equipment/gym setting.   Plan for CSW to follow up with patient on his decision tomorrow, as patient reports he will be here until Thursday.    Expected Discharge Plan:  (TBD) Barriers to Discharge: Continued Medical Work up   Patient Goals and CMS Choice Patient states their goals for this hospitalization and ongoing recovery are:: to go home CMS Medicare.gov Compare Post Acute Care list provided to:: Patient Choice offered to / list presented to : Patient  Expected Discharge Plan and Services Expected Discharge Plan:  (TBD)       Living arrangements for the past 2 months: Single Family Home Expected Discharge Date: 04/08/21                                    Prior Living Arrangements/Services Living arrangements for the past 2 months: Single Family Home Lives with:: Self                   Activities of Daily Living Home Assistive Devices/Equipment: None ADL Screening (condition at time of admission) Patient's cognitive ability adequate to safely complete daily activities?: Yes Is the patient deaf or have difficulty hearing?: Yes Does the patient have difficulty seeing, even when wearing glasses/contacts?: No Does the patient have difficulty concentrating, remembering, or making decisions?: No Patient able to express need for assistance with ADLs?: No Does the patient have difficulty dressing or bathing?: No Independently performs ADLs?:  Yes (appropriate for developmental age) Does the patient have difficulty walking or climbing stairs?: No Weakness of Legs: None Weakness of Arms/Hands: None  Permission Sought/Granted                  Emotional Assessment       Orientation: : Oriented to Self, Oriented to Place, Oriented to  Time, Oriented to Situation Alcohol / Substance Use: Not Applicable Psych Involvement: No (comment)  Admission diagnosis:  CHF (congestive heart failure) (HCC) [I50.9] Acute on chronic systolic congestive heart failure (HCC) [I50.23] Symptomatic anemia [D64.9] Patient Active Problem List   Diagnosis Date Noted   CKD (chronic kidney disease) stage 4, GFR 15-29 ml/min (HCC) 04/04/2021   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   S/P AAA (abdominal aortic aneurysm) repair 04/04/2021   Acute anemia 04/04/2021   Thrombocytopenia ([place]) 04/04/2021   ABLA (acute blood loss anemia) 04/04/2021   Pancytopenia ([place]) 04/04/2021   AKI (acute kidney injury) ([place]) 04/04/2021   Symptomatic anemia    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 12/19/2018   CKD (chronic kidney disease) stage 3, GFR 30-59 ml/min ([place]) 05/18/2018   Elevated hemoglobin A1c 05/18/2018   History of skin cancer 05/18/2018   Ischemic cardiomyopathy 12/16/2017   S/P ICD (internal cardiac defibrillator) procedure 12/16/2017   Acute on chronic systolic CHF (congestive heart failure) (  [place]) 09/07/2016   Congestive heart failure ([place]) 01/17/2015   Current tobacco use 01/17/2015   Hypercholesterolemia 01/17/2015   Peripheral vascular disease ([place]) 01/17/2015   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 [place], [place] [address] [place] [address] [place] [address] Phone: [phone] Fax: [phone]     Social Determinants of Health (SDOH) Interventions    Readmission Risk Interventions No flowsheet data found.

## 2021-04-08 NOTE — Progress Notes (Signed)
PROGRESS NOTE    Peter [name]  DVV:[id number] DOB: Dec 17, 1940 DOA: 04/03/2021 PCP: Tracie Harrier, MD  [id number]   Assessment & Plan:   Principal Problem:   Pancytopenia ([place]) Active Problems:   CAD (coronary artery disease)   Benign prostatic hyperplasia with urinary obstruction   S/P ICD (internal cardiac defibrillator) procedure   CKD (chronic kidney disease) stage 4, GFR 15-29 ml/min (HCC)   S/P AAA (abdominal aortic aneurysm) repair   Acute anemia   AKI (acute kidney injury) ([place])   Peter [name] is a 80 y.o. male with medical history significant for coronary artery disease status post PCI with stent angioplasty, hypertension, anemia of chronic disease, chronic diastolic dysfunction CHF who presents to the ER for evaluation of several weeks of shortness of breath associated with bilateral lower extremity swelling. Patient states that he had a respiratory infection around Thanksgiving from RSV and influenza and has had difficulty breathing since then.  He notes bilateral lower extremity swelling and insomnia which he attributes to not being able to breathe when he lays down.    Pancytopenia, ruled out --really low Hgb and plt on presentation likely lab error.  hematology consulted on admission, no further workup needed. --follow up with Dr. Tasia Catchings in 1 to 2 weeks for his thrombocytopenia.  Anemia, likely 2/2 CKD --hemoglobin of 5.7 on presentation likely a lab error, since Hgb went up to 12.1 with just 2u pRBC.  Thrombocytopenia --initial low 65 also likely due to lab error.  Up to 114 next day.  Elevated troponin likely due to demand ischemia --trop mid 200's, flat.  No chest pain. --cardiology consulted, no further ischemic workup.  history of coronary artery disease --Plavix held on admission due to concern of worsening anemia, since resumed --cont plavix Continue statins, nitrates and carvedilol  Acute on chronic diastolic dysfunction CHF Last known  LVEF was 50% from 09/22.   --cardiology consulted, rec diuresis initially.  Dosed IV lasix 40-60 BID based on renal function.  Pt reported good urine output with improvement in breathing and leg swelling.  Diuretic held on 12/26 due to increasing Cr. --cont to hold diuresis --advise pt to use compression for leg swelling in the future  AKI on CKD4 At baseline patient has a serum creatinine of 2.3 but on admission it is 2.95.  Maybe cardio-renal, since Cr actually improved a bit after IV lasix given in the ED.  Diuresis continued per cardio rec, with stable Cr, however, Cr worsened to 3.46 on 12/26,  Plan: --hold diuresis due to Cr increase --hold Cozaar --nephrology consult today  BPH Continue Flomax and finasteride   Hypertension --Home regimen includes amlodipine, carvedilol, isosorbide, furosemide and losartan.  --d/c amlodipine since pt has chronic leg swelling problem --cont coreg, Imdur --increase hydralazine to 100 mg TID (new med added by cardio) --hold Cozaar and diuresis due to Cr increase   DVT prophylaxis: Lovenox SQ Code Status: Full code  Family Communication: daughter updated on the phone today  Level of care: Progressive Dispo:   The patient is from: home Anticipated d/c is to: home Anticipated d/c date is: likely tomorrow Patient currently is not medically ready to d/c due to: AKI, pending nephrology consult   Subjective and Interval History:  Pt was found walking the halls unassisted.  No dyspnea.    Had planned on discharging pt with outpatient nephrology followup, but pt and family concerned and refused to be discharged today, so obtained inpatient nephrology consult today.   Objective: Vitals:  04/08/21 0807 04/08/21 1126 04/08/21 1619 04/08/21 1942  BP: (!) 176/70 (!) 156/55 (!) 146/53 (!) 165/69  Pulse: 70 84 97 (!) 50  Resp: 18 18 18 18   Temp: 98.4 F (36.9 C) 98.2 F (36.8 C) 97.6 F (36.4 C) 97.8 F (36.6 C)  TempSrc: Oral  Oral   SpO2:  98% 97% 97% 98%  Weight: 93 kg     Height:        Intake/Output Summary (Last 24 hours) at 04/08/2021 2038 Last data filed at 04/08/2021 1940 Gross per 24 hour  Intake 480 ml  Output 1750 ml  Net -1270 ml   Filed Weights   04/05/21 2341 04/07/21 1032 04/08/21 0807  Weight: 95.3 kg 93.1 kg 93 kg    Examination:   Constitutional: NAD, AAOx3, walking the halls HEENT: conjunctivae and lids normal, EOMI CV: No cyanosis.   RESP: normal respiratory effort, on RA Extremities: improved edema in BLE SKIN: warm, dry Neuro: II - XII grossly intact.   Psych: Normal mood and affect.  Appropriate judgement and reason   Data Reviewed: I have personally reviewed following labs and imaging studies  CBC: Recent Labs  Lab 04/04/21 1036 04/05/21 0846 04/06/21 0512 04/07/21 0505 04/08/21 0457  WBC 11.8* 8.5 7.7 8.2 7.6  NEUTROABS 8.5*  --   --   --   --   HGB 11.9* 12.1* 11.7* 11.0* 11.1*  HCT 35.9* 36.0* 35.2* 32.9* 34.7*  MCV 89.5 87.4 88.2 88.0 89.7  PLT 115* 114* 104* 94* 85*   Basic Metabolic Panel: Recent Labs  Lab 04/03/21 2223 04/04/21 0025 04/05/21 0846 04/06/21 0512 04/07/21 0505 04/08/21 0457  NA 138  --  140 140 138 143  K 4.6  --  3.8 3.6 3.6 3.6  CL 111  --  111 107 106 108  CO2 23  --  25 26 26 27   GLUCOSE 152*  --  103* 91 131* 94  BUN 67*  --  63* 58* 65* 59*  CREATININE 2.95*  --  2.84* 2.87* 3.46* 3.42*  CALCIUM 7.5*  --  7.8* 8.1* 7.7* 7.8*  MG  --  1.7  --  1.9 1.9 1.8   GFR: Estimated Creatinine Clearance: 19.7 mL/min (A) (by C-G formula based on SCr of 3.42 mg/dL (H)). Liver Function Tests: Recent Labs  Lab 04/04/21 0025 04/08/21 1527  AST 14*  --   ALT 30  --   ALKPHOS 73  --   BILITOT 0.9  --   PROT 5.0*  --   ALBUMIN 2.9* 2.8*   Recent Labs  Lab 04/04/21 0025  LIPASE 42   No results for input(s): AMMONIA in the last 168 hours. Coagulation Profile: Recent Labs  Lab 04/04/21 0025  INR 1.1   Cardiac Enzymes: No results for  input(s): CKTOTAL, CKMB, CKMBINDEX, TROPONINI in the last 168 hours. BNP (last 3 results) No results for input(s): PROBNP in the last 8760 hours. HbA1C: No results for input(s): HGBA1C in the last 72 hours. CBG: No results for input(s): GLUCAP in the last 168 hours. Lipid Profile: No results for input(s): CHOL, HDL, LDLCALC, TRIG, CHOLHDL, LDLDIRECT in the last 72 hours. Thyroid Function Tests: No results for input(s): TSH, T4TOTAL, FREET4, T3FREE, THYROIDAB in the last 72 hours.  Anemia Panel: No results for input(s): VITAMINB12, FOLATE, FERRITIN, TIBC, IRON, RETICCTPCT in the last 72 hours.  Sepsis Labs: No results for input(s): PROCALCITON, LATICACIDVEN in the last 168 hours.  Recent Results (from the past 240  hour(s))  Resp Panel by RT-PCR (Flu A&B, Covid) Nasopharyngeal Swab     Status: None   Collection Time: 04/04/21  4:23 AM   Specimen: Nasopharyngeal Swab; Nasopharyngeal(NP) swabs in vial transport medium  Result Value Ref Range Status   SARS Coronavirus 2 by RT PCR NEGATIVE NEGATIVE Final    Comment: (NOTE) SARS-CoV-2 target nucleic acids are NOT DETECTED.  The SARS-CoV-2 RNA is generally detectable in upper respiratory specimens during the acute phase of infection. The lowest concentration of SARS-CoV-2 viral copies this assay can detect is 138 copies/mL. A negative result does not preclude SARS-Cov-2 infection and should not be used as the sole basis for treatment or other patient management decisions. A negative result may occur with  improper specimen collection/handling, submission of specimen other than nasopharyngeal swab, presence of viral mutation(s) within the areas targeted by this assay, and inadequate number of viral copies(<138 copies/mL). A negative result must be combined with clinical observations, patient history, and epidemiological information. The expected result is Negative.  Fact Sheet for Patients:   EntrepreneurPulse.com.au  Fact Sheet for Healthcare Providers:  IncredibleEmployment.be  This test is no t yet approved or cleared by the [place] FDA and  has been authorized for detection and/or diagnosis of SARS-CoV-2 by FDA under an Emergency Use Authorization (EUA). This EUA will remain  in effect (meaning this test can be used) for the duration of the COVID-19 declaration under Section 564(b)(1) of the Act, 21 U.S.C.section 360bbb-3(b)(1), unless the authorization is terminated  or revoked sooner.       Influenza A by PCR NEGATIVE NEGATIVE Final   Influenza B by PCR NEGATIVE NEGATIVE Final    Comment: (NOTE) The Xpert Xpress SARS-CoV-2/FLU/RSV plus assay is intended as an aid in the diagnosis of influenza from Nasopharyngeal swab specimens and should not be used as a sole basis for treatment. Nasal washings and aspirates are unacceptable for Xpert Xpress SARS-CoV-2/FLU/RSV testing.  Fact Sheet for Patients: EntrepreneurPulse.com.au  Fact Sheet for Healthcare Providers: IncredibleEmployment.be  This test is not yet approved or cleared by the [place] FDA and has been authorized for detection and/or diagnosis of SARS-CoV-2 by FDA under an Emergency Use Authorization (EUA). This EUA will remain in effect (meaning this test can be used) for the duration of the COVID-19 declaration under Section 564(b)(1) of the Act, 21 U.S.C. section 360bbb-3(b)(1), unless the authorization is terminated or revoked.  Performed at [facility], [address] [place], [place] [address]       Radiology Studies: US RENAL  Result Date: 04/08/2021 CLINICAL DATA:  Renal dysfunction. EXAM: RENAL / URINARY TRACT ULTRASOUND COMPLETE COMPARISON:  Abdominal sonogram done on 06/20/2015 FINDINGS: Right Kidney: Renal measurements: 10.9 x 5.3 x 5 cm = volume: 152 mL. There is no hydronephrosis. There is  increased cortical echogenicity. Left Kidney: Renal measurements: 11.2 x 5.5 x 4.5 cm = volume: 147 mL. There is no hydronephrosis. There is increased cortical echogenicity. There is 2 cm cyst in the upper pole. Bladder: Appears normal for degree of bladder distention. Other: None. IMPRESSION: There is no hydronephrosis. Increased cortical echogenicity suggests medical renal disease. 2 cm left renal cyst. Electronically Signed   By: Elmer Picker M.D.   On: 04/08/2021 16:11     Scheduled Meds:  atorvastatin  40 mg Oral q1800   carvedilol  6.25 mg Oral BID WC   clopidogrel  75 mg Oral Daily   enoxaparin (LOVENOX) injection  30 mg Subcutaneous Q24H   finasteride  5 mg Oral Daily   hydrALAZINE  100 mg Oral Q8H   isosorbide mononitrate  30 mg Oral Daily   pantoprazole  40 mg Oral Daily   sodium chloride flush  3 mL Intravenous Q12H   tamsulosin  0.4 mg Oral Daily   cyanocobalamin  1,000 mcg Oral Daily   Continuous Infusions:  sodium chloride       LOS: 4 days     Enzo Bi, MD [facility] If 7PM-7AM, please contact night-coverage 04/08/2021, 8:38 PM

## 2021-04-09 DIAGNOSIS — Z95810 Presence of automatic (implantable) cardiac defibrillator: Secondary | ICD-10-CM

## 2021-04-09 DIAGNOSIS — N179 Acute kidney failure, unspecified: Secondary | ICD-10-CM

## 2021-04-09 DIAGNOSIS — N401 Enlarged prostate with lower urinary tract symptoms: Secondary | ICD-10-CM

## 2021-04-09 DIAGNOSIS — Z9889 Other specified postprocedural states: Secondary | ICD-10-CM

## 2021-04-09 DIAGNOSIS — N138 Other obstructive and reflux uropathy: Secondary | ICD-10-CM

## 2021-04-09 DIAGNOSIS — Z8679 Personal history of other diseases of the circulatory system: Secondary | ICD-10-CM

## 2021-04-09 LAB — HEPATITIS B CORE ANTIBODY, IGM: Hep B C IgM: NONREACTIVE

## 2021-04-09 LAB — BASIC METABOLIC PANEL
Anion gap: 8 (ref 5–15)
BUN: 57 mg/dL — ABNORMAL HIGH (ref 8–23)
CO2: 26 mmol/L (ref 22–32)
Calcium: 8.1 mg/dL — ABNORMAL LOW (ref 8.9–10.3)
Chloride: 108 mmol/L (ref 98–111)
Creatinine, Ser: 3.13 mg/dL — ABNORMAL HIGH (ref 0.61–1.24)
GFR, Estimated: 19 mL/min — ABNORMAL LOW (ref >60)
Glucose, Bld: 105 mg/dL — ABNORMAL HIGH (ref 70–99)
Potassium: 3.5 mmol/L (ref 3.5–5.1)
Sodium: 142 mmol/L (ref 135–145)

## 2021-04-09 LAB — MAGNESIUM: Magnesium: 1.8 mg/dL (ref 1.7–2.4)

## 2021-04-09 LAB — CBC
HCT: 36.7 % — ABNORMAL LOW (ref 39.0–52.0)
Hemoglobin: 11.8 g/dL — ABNORMAL LOW (ref 13.0–17.0)
MCH: 28.9 pg (ref 26.0–34.0)
MCHC: 32.2 g/dL (ref 30.0–36.0)
MCV: 90 fL (ref 80.0–100.0)
Platelets: 90 10*3/uL — ABNORMAL LOW (ref 150–400)
RBC: 4.08 MIL/uL — ABNORMAL LOW (ref 4.22–5.81)
RDW: 13.9 % (ref 11.5–15.5)
WBC: 7.2 10*3/uL (ref 4.0–10.5)
nRBC: 0 % (ref 0.0–0.2)

## 2021-04-09 LAB — HEPATITIS C ANTIBODY: HCV Ab: NONREACTIVE

## 2021-04-09 LAB — HEPATITIS B CORE ANTIBODY, TOTAL: Hep B Core Total Ab: NONREACTIVE

## 2021-04-09 LAB — HEPATITIS B SURFACE ANTIBODY,QUALITATIVE: Hep B S Ab: NONREACTIVE

## 2021-04-09 LAB — HEPATITIS B SURFACE ANTIGEN: Hepatitis B Surface Ag: NONREACTIVE

## 2021-04-09 NOTE — Progress Notes (Signed)
[facility] Cardiology  Patient Description: Peter [name] is an 80 year old male with PMH significant for multivessel coronary artery disease s/p PCI/stent, s/p AICD, chronic diastolic CHF, s/p AAA repair, hypertension and anemia of chronic disease who was admitted with c/o dyspnea. Initially thought to have pancytopenia; however, after further testing this was ruled out and likely due to a lab error. Cardiology consulted for elevated troponin and CHF exacerbation.   SUBJECTIVE: The patient reports to be feeling well on today and he denies having any dyspnea at this time.  He also denies having any chest pain, palpitations, peripheral edema or dizziness. The patient states that he only has dyspnea "when I get hot" and is worried about going home because he lives alone. His daughter was on the phone during the visit and states that she will be in town to see the patient on tomorrow and she is requesting to speak with the hospitalist physician as well as the dietitian at this time.   OBJECTIVE:   Vitals:   04/08/21 2208 04/09/21 0535 04/09/21 0753 04/09/21 1149  BP:  (!) 167/61 (!) 168/60 (!) 158/57  Pulse: 66 97 88 (!) 59  Resp:  20 18 18   Temp:  97.9 F (36.6 C) 98.1 F (36.7 C) 97.8 F (36.6 C)  TempSrc:  Oral Oral   SpO2:  98% 97% 98%  Weight:  93.2 kg    Height:         Intake/Output Summary (Last 24 hours) at 04/09/2021 1408 Last data filed at 04/09/2021 1035 Gross per 24 hour  Intake 600 ml  Output 1250 ml  Net -650 ml      PHYSICAL EXAM  General: Well developed, well nourished, in no acute distress HEENT:  Normocephalic and atraumatic. PERRL Neck:  No JVD.  Lungs: Clear bilaterally to auscultation.  Chest expansion symmetrical.  No wheezes, rales or rhonchi at this time. Heart: irregular RR . Normal S1 and S2 without gallops or murmurs.  Abdomen: Bowel sounds are positive, abdomen soft and non-tender  Msk:  Normal strength and tone for age. Extremities: No clubbing, cyanosis or  edema.   Neuro: Alert and oriented X 3. Psych:  Good affect, responds appropriately   LABS: Basic Metabolic Panel: Recent Labs    04/08/21 0457 04/09/21 0606  NA 143 142  K 3.6 3.5  CL 108 108  CO2 27 26  GLUCOSE 94 105*  BUN 59* 57*  CREATININE 3.42* 3.13*  CALCIUM 7.8* 8.1*  MG 1.8 1.8   Liver Function Tests: Recent Labs    04/08/21 1527  ALBUMIN 2.8*   No results for input(s): LIPASE, AMYLASE in the last 72 hours. CBC: Recent Labs    04/08/21 0457 04/09/21 0606  WBC 7.6 7.2  HGB 11.1* 11.8*  HCT 34.7* 36.7*  MCV 89.7 90.0  PLT 85* 90*   Cardiac Enzymes: No results for input(s): CKTOTAL, CKMB, CKMBINDEX, TROPONINI in the last 72 hours. BNP: Invalid input(s): POCBNP D-Dimer: No results for input(s): DDIMER in the last 72 hours. Hemoglobin A1C: No results for input(s): HGBA1C in the last 72 hours. Fasting Lipid Panel: No results for input(s): CHOL, HDL, LDLCALC, TRIG, CHOLHDL, LDLDIRECT in the last 72 hours. Thyroid Function Tests: No results for input(s): TSH, T4TOTAL, T3FREE, THYROIDAB in the last 72 hours.  Invalid input(s): FREET3 Anemia Panel: No results for input(s): VITAMINB12, FOLATE, FERRITIN, TIBC, IRON, RETICCTPCT in the last 72 hours.  US RENAL  Result Date: 04/08/2021 CLINICAL DATA:  Renal dysfunction. EXAM: RENAL / URINARY  TRACT ULTRASOUND COMPLETE COMPARISON:  Abdominal sonogram done on 06/20/2015 FINDINGS: Right Kidney: Renal measurements: 10.9 x 5.3 x 5 cm = volume: 152 mL. There is no hydronephrosis. There is increased cortical echogenicity. Left Kidney: Renal measurements: 11.2 x 5.5 x 4.5 cm = volume: 147 mL. There is no hydronephrosis. There is increased cortical echogenicity. There is 2 cm cyst in the upper pole. Bladder: Appears normal for degree of bladder distention. Other: None. IMPRESSION: There is no hydronephrosis. Increased cortical echogenicity suggests medical renal disease. 2 cm left renal cyst. Electronically Signed   By:  Elmer Picker M.D.   On: 04/08/2021 16:11     Echo:  (04/04/21) IMPRESSIONS   1. Left ventricular ejection fraction, by estimation, is 50 to 55%. The  left ventricle has low normal function. The left ventricle has no regional  wall motion abnormalities. The left ventricular internal cavity size was  mildly dilated. There is mild  left ventricular hypertrophy. Left ventricular diastolic parameters are  consistent with Grade II diastolic dysfunction (pseudonormalization).   2. Right ventricular systolic function is low normal. The right  ventricular size is moderately enlarged.   3. The mitral valve is normal in structure. Mild mitral valve  regurgitation.   4. The aortic valve is normal in structure. Aortic valve regurgitation is  mild.   TELEMETRY: Atrial fibrillation with frequent multifocal PVCs and a heart rate of 94 bpm  ASSESSMENT AND PLAN:  Principal Problem:   Symptomatic anemia Active Problems:   CAD (coronary artery disease)   Benign prostatic hyperplasia with urinary obstruction   S/P ICD (internal cardiac defibrillator) procedure   CKD (chronic kidney disease) stage 4, GFR 15-29 ml/min (HCC)   S/P AAA (abdominal aortic aneurysm) repair   Acute anemia   Thrombocytopenia (HCC)   AKI (acute kidney injury) ([place])   # Elevated troponin  # CAD s/p PCI/stent Troponin initially elevated at 262>> 263>> 255>> 256 likely secondary to demand ischemia in the setting of CHF exacerbation. The patient is not having any chest pain or anginal symptoms at this time.   -Continue Coreg, atorvastatin and plavix.  - Follow up with cardiology 1 week post discharge.   # Chronic diastolic CHF  # Ischemic cardiomyopathy s/p AICD  Patient presented with complaints of dyspnea and was found to have an elevated BNP at 761.1.  He underwent an echocardiogram on 12/23 which revealed normal LV systolic function with estimated EF of 50-55%, mild LVH, grade 2 diastolic dysfunction, moderately  enlarged RV size, mild mitral valve regurgitation and mild aortic valve regurgitation. AICD in place without any discharges noted. The patient's dyspnea has improved and he appears to be euvolemic at this time.   -Hold losartan, lasix and spironolactone per nephrology.  We will consider restarting slowly as outpatient.   -Strict I's and O's, daily weights, low-sodium diet.  -Patient is cleared for discharge from a cardiac standpoint.  No further cardiac work-up recommended at this time.  Please have patient follow-up with cardiology in 1 week postdischarge.    # AKI on CKD BUN = 57 (H), creatinine = 3.13 (H), GFR = 19 (L) which has improved slightly over the past 3 days.  Nephrology was consulted and their input was appreciated.  -Recommendations as above.  -Follow-up with nephrology as outpatient as scheduled.  # Hypertension The patient's blood pressure appears to be reasonably controlled at this time.  ARB, MRA and diuretic held at this time in the presence of AKI on CKD.   -Continue  Coreg, Imdur and amlodipine.    [place], [id number]  04/09/2021 2:08 PM

## 2021-04-09 NOTE — Progress Notes (Cosign Needed)
Occupational Therapy * Physical Therapy * Speech Therapy  DATE ____12/28/22_  PATIENT NAME___[name]___        PATIENT MRN_____[id number]__  DIAGNOSIS/DIAGNOSIS CODE ____D64.9___ DATE OF DISCHARGE: ___12/28/22_____  PRIMARY CARE PHYSICIAN ________Dr. Hande___ PCP PHONE/FAX______[phone]___  Dear Provider (Name: ______[facility] PT________________________  Fax: _______[phone] _________):  I certify that I have examined this patient and that occupational/physical/speech therapy is necessary on an outpatient basis.  The patient has expressed interest in completing their recommended course of therapy at your  location. Once a formal order from the patients primary care physician has been obtained, please contact him/her to schedule an appointment for evaluation at your earliest convenience.  [ X ] Physical Therapy Evaluate and Treat  [ ]  Occupational Therapy Evaluate and Treat  [ ]  Speech Therapy Evaluate and Treat  The patients primary care physician (listed above) must furnish and be responsible for a formal order such that the recommended services may be furnished while under the primary physicians care, and that the plan of care will be established and reviewed every 30 days (or more often if condition necessitates).    Hospitalist/Attending Physician Signature NOTE: Diagnosis and signature are required to validate order Date   Printed Hospitalist/Attending Physician Name

## 2021-04-09 NOTE — TOC Progression Note (Signed)
Transition of Care [facility]) - Progression Note    Patient Details  Name: Peter [name] MRN: [id number] Date of Birth: April 15, 1940  [facility] of [facility]) CM/SW [place], [place] Phone Number: 04/09/2021, 12:14 PM  Clinical Narrative:     Spoke with patient at bedside, patient agreeable to outpatient physical therapy services at [facility].   Referral to be faxed to [phone] (fax)  No further discharge needs identified at this time.   Expected Discharge Plan:  (TBD) Barriers to Discharge: Continued Medical Work up  Expected Discharge Plan and Services Expected Discharge Plan:  (TBD)       Living arrangements for the past 2 months: Single Family Home Expected Discharge Date: 04/08/21                                     Social Determinants of Health (SDOH) Interventions    Readmission Risk Interventions No flowsheet data found.

## 2021-04-09 NOTE — Discharge Instructions (Signed)
Peter [name],  You were in the hospital with heart failure. This was treated successfully with Lasix, however this also led to a little bit of kidney injury. Thankfully your kidney injury is improving off the lasix. Please follow-up with the nephrologist and cardiologist. Please DO NOT take losartan, spironolactone and lasix until you see the nephrologist (kidney doctor).

## 2021-04-09 NOTE — Progress Notes (Signed)
[facility]  ROUNDING NOTE   Subjective:   Patient seen laying in bed States he feels well Tolerating meals Denies nausea and vomiting Denies shortness of breath  Creatinine improved to 3.1 Urine output of 1.55L recorded in past 24 hours  Objective:  Vital signs in last 24 hours:  Temp:  [97.6 F (36.4 C)-98.1 F (36.7 C)] 97.8 F (36.6 C) (12/28 1149) Pulse Rate:  [50-97] 59 (12/28 1149) Resp:  [18-20] 18 (12/28 1149) BP: (146-168)/(53-69) 158/57 (12/28 1149) SpO2:  [97 %-98 %] 98 % (12/28 1149) Weight:  [93.2 kg] 93.2 kg (12/28 0535)  Weight change: -0.078 kg Filed Weights   04/07/21 1032 04/08/21 0807 04/09/21 0535  Weight: 93.1 kg 93 kg 93.2 kg    Intake/Output: I/O last 3 completed shifts: In: 57 [P.O.:720] Out: 2150 [Urine:2150]   Intake/Output this shift:  Total I/O In: 120 [P.O.:120] Out: 200 [Urine:200]  Physical Exam: General: NAD, resting in bed  Head: Normocephalic, atraumatic. Moist oral mucosal membranes  Eyes: Anicteric  Lungs:  Clear to auscultation, normal effort  Heart: Regular rate and rhythm  Abdomen:  Soft, nontender  Extremities:  no peripheral edema.  Neurologic: Nonfocal, moving all four extremities  Skin: No lesions       Basic Metabolic Panel: Recent Labs  Lab 04/04/21 0025 04/05/21 0846 04/06/21 0512 04/07/21 0505 04/08/21 0457 04/09/21 0606  NA  --  140 140 138 143 142  K  --  3.8 3.6 3.6 3.6 3.5  CL  --  111 107 106 108 108  CO2  --  25 26 26 27 26   GLUCOSE  --  103* 91 131* 94 105*  BUN  --  63* 58* 65* 59* 57*  CREATININE  --  2.84* 2.87* 3.46* 3.42* 3.13*  CALCIUM  --  7.8* 8.1* 7.7* 7.8* 8.1*  MG 1.7  --  1.9 1.9 1.8 1.8    Liver Function Tests: Recent Labs  Lab 04/04/21 0025 04/08/21 1527  AST 14*  --   ALT 30  --   ALKPHOS 73  --   BILITOT 0.9  --   PROT 5.0*  --   ALBUMIN 2.9* 2.8*   Recent Labs  Lab 04/04/21 0025  LIPASE 42   No results for input(s): AMMONIA in the last 168  hours.  CBC: Recent Labs  Lab 04/04/21 1036 04/05/21 0846 04/06/21 0512 04/07/21 0505 04/08/21 0457 04/09/21 0606  WBC 11.8* 8.5 7.7 8.2 7.6 7.2  NEUTROABS 8.5*  --   --   --   --   --   HGB 11.9* 12.1* 11.7* 11.0* 11.1* 11.8*  HCT 35.9* 36.0* 35.2* 32.9* 34.7* 36.7*  MCV 89.5 87.4 88.2 88.0 89.7 90.0  PLT 115* 114* 104* 94* 85* 90*    Cardiac Enzymes: No results for input(s): CKTOTAL, CKMB, CKMBINDEX, TROPONINI in the last 168 hours.  BNP: Invalid input(s): POCBNP  CBG: No results for input(s): GLUCAP in the last 168 hours.  Microbiology: Results for orders placed or performed during the hospital encounter of 04/03/21  Resp Panel by RT-PCR (Flu A&B, Covid) Nasopharyngeal Swab     Status: None   Collection Time: 04/04/21  4:23 AM   Specimen: Nasopharyngeal Swab; Nasopharyngeal(NP) swabs in vial transport medium  Result Value Ref Range Status   SARS Coronavirus 2 by RT PCR NEGATIVE NEGATIVE Final    Comment: (NOTE) SARS-CoV-2 target nucleic acids are NOT DETECTED.  The SARS-CoV-2 RNA is generally detectable in upper respiratory specimens during the  acute phase of infection. The lowest concentration of SARS-CoV-2 viral copies this assay can detect is 138 copies/mL. A negative result does not preclude SARS-Cov-2 infection and should not be used as the sole basis for treatment or other patient management decisions. A negative result may occur with  improper specimen collection/handling, submission of specimen other than nasopharyngeal swab, presence of viral mutation(s) within the areas targeted by this assay, and inadequate number of viral copies(<138 copies/mL). A negative result must be combined with clinical observations, patient history, and epidemiological information. The expected result is Negative.  Fact Sheet for Patients:  EntrepreneurPulse.com.au  Fact Sheet for Healthcare Providers:  IncredibleEmployment.be  This  test is no t yet approved or cleared by the [place] FDA and  has been authorized for detection and/or diagnosis of SARS-CoV-2 by FDA under an Emergency Use Authorization (EUA). This EUA will remain  in effect (meaning this test can be used) for the duration of the COVID-19 declaration under Section 564(b)(1) of the Act, 21 U.S.C.section 360bbb-3(b)(1), unless the authorization is terminated  or revoked sooner.       Influenza A by PCR NEGATIVE NEGATIVE Final   Influenza B by PCR NEGATIVE NEGATIVE Final    Comment: (NOTE) The Xpert Xpress SARS-CoV-2/FLU/RSV plus assay is intended as an aid in the diagnosis of influenza from Nasopharyngeal swab specimens and should not be used as a sole basis for treatment. Nasal washings and aspirates are unacceptable for Xpert Xpress SARS-CoV-2/FLU/RSV testing.  Fact Sheet for Patients: EntrepreneurPulse.com.au  Fact Sheet for Healthcare Providers: IncredibleEmployment.be  This test is not yet approved or cleared by the [place] FDA and has been authorized for detection and/or diagnosis of SARS-CoV-2 by FDA under an Emergency Use Authorization (EUA). This EUA will remain in effect (meaning this test can be used) for the duration of the COVID-19 declaration under Section 564(b)(1) of the Act, 21 U.S.C. section 360bbb-3(b)(1), unless the authorization is terminated or revoked.  Performed at [facility], [address] [place], [place] [address]     Coagulation Studies: No results for input(s): LABPROT, INR in the last 72 hours.  Urinalysis: Recent Labs    04/08/21 1745  COLORURINE YELLOW*  LABSPEC 1.014  PHURINE 6.0  GLUCOSEU NEGATIVE  HGBUR NEGATIVE  BILIRUBINUR NEGATIVE  KETONESUR NEGATIVE  PROTEINUR >=300*  NITRITE NEGATIVE  LEUKOCYTESUR NEGATIVE      Imaging: US RENAL  Result Date: 04/08/2021 CLINICAL DATA:  Renal dysfunction. EXAM: RENAL / URINARY TRACT  ULTRASOUND COMPLETE COMPARISON:  Abdominal sonogram done on 06/20/2015 FINDINGS: Right Kidney: Renal measurements: 10.9 x 5.3 x 5 cm = volume: 152 mL. There is no hydronephrosis. There is increased cortical echogenicity. Left Kidney: Renal measurements: 11.2 x 5.5 x 4.5 cm = volume: 147 mL. There is no hydronephrosis. There is increased cortical echogenicity. There is 2 cm cyst in the upper pole. Bladder: Appears normal for degree of bladder distention. Other: None. IMPRESSION: There is no hydronephrosis. Increased cortical echogenicity suggests medical renal disease. 2 cm left renal cyst. Electronically Signed   By: Elmer Picker M.D.   On: 04/08/2021 16:11     Medications:    sodium chloride      atorvastatin  40 mg Oral q1800   carvedilol  6.25 mg Oral BID WC   clopidogrel  75 mg Oral Daily   enoxaparin (LOVENOX) injection  30 mg Subcutaneous Q24H   finasteride  5 mg Oral Daily   hydrALAZINE  100 mg Oral Q8H   isosorbide mononitrate  30 mg Oral Daily   pantoprazole  40 mg Oral Daily   sodium chloride flush  3 mL Intravenous Q12H   tamsulosin  0.4 mg Oral Daily   cyanocobalamin  1,000 mcg Oral Daily   sodium chloride, acetaminophen **OR** acetaminophen, hydrALAZINE, ondansetron **OR** ondansetron (ZOFRAN) IV, sodium chloride flush  Assessment/ Plan:  Peter [name] is a 80 y.o.  male with past medical history of CAD, hypertension, hyperlipidemia, CHF, and chronic kidney disease, who was admitted to [facility] on 04/03/2021 for CHF (congestive heart failure) ([place]) [I50.9] Acute on chronic systolic congestive heart failure ([place]) [I50.23] Symptomatic anemia [D64.9]   Acute Kidney Injury on chronic kidney disease stage IV with baseline creatinine 2.3 and GFR of 23 on 12/17/20.  Acute kidney injury likely due to overdiuresis vs cardiorenal syndrome. Agree with held diuresis.No IV contrast exposure. Losartan held. Renal ultrasound negative for obstruction. Hepatitis B studies negative,  UA shows protein, Urine creatinine ratio elevated to 3.9. Awaiting other serologies.  Will schedule follow up with our office at discharge.   Lab Results  Component Value Date   CREATININE 3.13 (H) 04/09/2021   CREATININE 3.42 (H) 04/08/2021   CREATININE 3.46 (H) 04/07/2021    Intake/Output Summary (Last 24 hours) at 04/09/2021 1255 Last data filed at 04/09/2021 1035 Gross per 24 hour  Intake 840 ml  Output 1250 ml  Net -410 ml   2. Hypertension with chronic kidney disease. Home regimen includes amlodipine, carvedilol, isosorbide, furosemide and losartan. Losartan and furosemide held. BP currently 158/57. Continue to hold Furosemide at discharge.    3. Chronic diastolic heart failure. ECHO from 04/04/21 shows EF 50-55% with mild LVH and Grade II diastolic dysfunction. Follows Dr Clayborn Bigness.    LOS: [place] 12/28/202212:55 PM

## 2021-04-09 NOTE — Discharge Summary (Signed)
Physician Discharge Summary  Peter [name] WUJ:[id number] DOB: 1940/05/09 DOA: 04/03/2021  PCP: Tracie Harrier, MD  Admit date: 04/03/2021 Discharge date: 04/09/2021  Admitted From: Home Disposition: Home  Recommendations for Outpatient Follow-up:  Follow up with PCP in 1 week Please obtain BMP/CBC in one week Please follow up on the following pending results: None  Home Health: PT/OT Equipment/Devices: None  Discharge Condition: Stable CODE STATUS: Full code Diet recommendation: Heart healthy   Brief/Interim Summary:  Admission HPI written by Collier Bullock, MD   HPI: Peter [name] is a 80 y.o. male with medical history significant for coronary artery disease status post PCI with stent angioplasty, hypertension, anemia of chronic disease, chronic diastolic dysfunction CHF who presents to the ER for evaluation of several weeks of shortness of breath associated with bilateral lower extremity swelling. Patient states that he had a respiratory infection around Thanksgiving from RSV and influenza and has had difficulty breathing since then.  He notes bilateral lower extremity swelling and insomnia which he attributes to not being able to breathe when he lays down.  He denies having any chest pain and denies having any palpitations or diaphoresis.  He denies having any abdominal pain, no hematemesis, no melena, no nausea, no hematochezia, no dizziness, no lightheadedness, no falls, no fever, no chills, no cough. He takes aspirin occasionally for pain. Sodium 138, potassium 4.6, chloride 111, bicarb 23, glucose 152, BUN 67, creatinine 2.95 above a baseline of 2.3, calcium 7.5, magnesium 1.7, alkaline phosphatase 73, albumin 2.9, lipase 42, AST 14, ALT 30, total protein 5.0, BNP 761, troponin 263, PT 14.6, INR 1.1, white count 4.9, hemoglobin 5.7 down from 11.5 IN 09/22, hematocrit 18, MCV 93.8, RDW 14.1, platelet count 65 Respiratory viral panel is negative CT scan of  abdomen and pelvis shows no acute findings in the abdomen or pelvis to suggest retroperitoneal hemorrhage.  Cholelithiasis.  Status post aortic endograft placement. Chest x-ray reviewed by me shows trace pleural effusions. Lower extremity venous Dopplers negative for DVT Twelve-lead EKG reviewed by me shows sinus rhythm with frequent PVCs and PACs.  Left bundle branch block   Hospital course:  Symptomatic anemia Anemia of 5.7 on admission. Patient received 2 units of PRBC with post-transfusion hemoglobin of 11.9, up to 12.1. Possible lab error on admission. Resolved.  Thrombocytopenia Platelets of 65,000 on admission with improvement to 114 on repeat lab draw. Initial lab possibly an error. Trended down slightly and stabilized. Hematology with recommendations to follow-up as an outpatient.  Acute on chronic diastolic heart failure Cardiology consulted. Patient managed on Lasix IV for diuresis with good urine output, improvement of dyspnea and improvement of peripheral edema. Lasix held secondary to   AKI on CKD stage IV Baseline creatinine of 2.3. Creatinine of 2.95 on admission. Creatinine improved with diuresis, however then started to worsen. Diuresis held with improvement back towards baseline. Nephrology with recommendation to discharge and to follow-up outpatient for repeat labs. Recommendation to hold diuretics and losartan on discharge until follow-up visit.  Demand ischemia Troponin up to mid 200s and flat and no associated chest pain. Cardiology on board and recommended no inpatient ischemic workup.  CAD Continue Plavix, Coreg and Lipitor  BPH Continue Flomax and finasteride  Primary hypertension Continue Coreg and Imdur. Losartan and spironolactone held on discharge.   Discharge Instructions  Discharge Instructions     Ambulatory referral to Nephrology   Complete by: As directed    Discharge instructions   Complete by: As directed  Due to your worsening kidney  function, certain medications are on hold until you follow up with outpatient nephrology or cardiology.    Please treat your leg swelling with compression stockings, instead of taking fluid pills which can worsen your kidney function.  Referral is made for you to see nephrology (kidney doctor).   Dr. Enzo Bi - -      Allergies as of 04/09/2021       Reactions   Iodinated Contrast Media Shortness Of Breath   Iodinated Glycerol  [glycerol, Iodinated] Shortness Of Breath        Medication List     STOP taking these medications    amLODipine 10 MG tablet Commonly known as: NORVASC   predniSONE 10 MG tablet Commonly known as: DELTASONE       TAKE these medications    atorvastatin 40 MG tablet Commonly known as: LIPITOR Take 1 tablet (40 mg total) by mouth daily at 6 PM.   carvedilol 6.25 MG tablet Commonly known as: COREG Take 6.25 mg by mouth 2 (two) times daily with a meal.   cetirizine 10 MG tablet Commonly known as: ZYRTEC Take 10 mg by mouth.   clobetasol cream 0.05 % Commonly known as: TEMOVATE APPLY TO PSORIASIS BID PRN UNTIL SMOOTH   clopidogrel 75 MG tablet Commonly known as: PLAVIX TAKE 1 TABLET(75 MG) BY MOUTH EVERY DAY   cyanocobalamin 1000 MCG tablet Take 1,000 mcg by mouth daily.   finasteride 5 MG tablet Commonly known as: PROSCAR Take 1 tablet (5 mg total) by mouth daily.   furosemide 20 MG tablet Commonly known as: LASIX Hold this medication until followup with nephrology or cardiology. What changed:  how much to take how to take this when to take this additional instructions   GLUCOSAMINE-CHONDROITIN-VIT C PO Take 1 tablet by mouth daily.   hydrALAZINE 100 MG tablet Commonly known as: APRESOLINE Take 1 tablet (100 mg total) by mouth every 8 (eight) hours.   isosorbide mononitrate 30 MG 24 hr tablet Commonly known as: IMDUR Take 30 mg by mouth daily.   losartan 100 MG tablet Commonly known as: COZAAR Hold until  followup with your outpatient doctor due to worsening kidney function. What changed:  how much to take how to take this when to take this additional instructions   omeprazole 20 MG capsule Commonly known as: PRILOSEC Take by mouth.   spironolactone 25 MG tablet Commonly known as: ALDACTONE Hold until followup with your outpatient doctor due to worsening kidney function. What changed:  how much to take how to take this when to take this additional instructions   tamsulosin 0.4 MG Caps capsule Commonly known as: FLOMAX Take 1 capsule (0.4 mg total) by mouth daily.        Follow-up Information     Tracie Harrier, MD Follow up in 1 week(s).   Specialty: Internal Medicine Why: @ Contact information: [facility]- Internal Medicine [place] [address] [phone]         Lavonia Dana, MD Follow up in 2 week(s).   Specialty: Nephrology Why: Patient will need to make a follow up appointment. Contact information: [place] [address] [phone]                Allergies  Allergen Reactions   Iodinated Contrast Media Shortness Of Breath   Iodinated Glycerol  [Glycerol, Iodinated] Shortness Of Breath    Consultations: Cardiology Nephrology   Procedures/Studies: CT ABDOMEN  PELVIS WO CONTRAST  Result Date: 04/04/2021 CLINICAL DATA:  Retroperitoneal hemorrhage suspected. EXAM: CT ABDOMEN AND PELVIS WITHOUT CONTRAST TECHNIQUE: Multidetector CT imaging of the abdomen and pelvis was performed following the standard protocol without IV contrast. COMPARISON:  01/29/2016 FINDINGS: Lower chest: Bibasilar atelectasis noted with tiny bilateral pleural effusions. Hepatobiliary: No focal abnormality in the liver on this study without intravenous contrast. Layering tiny gallstones evident. No intrahepatic or extrahepatic biliary dilation. Pancreas: No focal mass lesion. No dilatation of the main duct.  No intraparenchymal cyst. No peripancreatic edema. Spleen: No splenomegaly. No focal mass lesion. Adrenals/Urinary Tract: No adrenal nodule or mass. Small cyst noted upper pole left kidney. No evidence for hydroureter. The urinary bladder appears normal for the degree of distention. Stomach/Bowel: Stomach is unremarkable. No gastric wall thickening. No evidence of outlet obstruction. Duodenum is normally positioned as is the ligament of Treitz. No small bowel wall thickening. No small bowel dilatation. The terminal ileum is normal. The appendix is normal. No gross colonic mass. No colonic wall thickening. Diverticular changes are noted in the left colon without evidence of diverticulitis. 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 Low-attenuation left para-aortic lymph nodes (image 32/2) are stable since 2017 consistent with benign etiology. No pelvic sidewall lymphadenopathy. Reproductive: The prostate gland and seminal vesicles are unremarkable. Other: No intraperitoneal free fluid. Musculoskeletal: Multiple supraumbilical ventral hernias identified, containing only fat. Stable bone island left anterior pubic ramus. No worrisome lytic or sclerotic osseous abnormality. IMPRESSION: 1. No acute findings in the abdomen or pelvis. Specifically, no findings to suggests retroperitoneal hemorrhage. 2. 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 3. No change in the left para-aortic lymphadenopathy consistent with benign/reactive etiology. 4. Cholelithiasis. 5. Multiple supraumbilical ventral hernias containing only fat. Electronically Signed   By: Misty Stanley M.D.   On: 04/04/2021 07:36   DG Chest 2 View  Result Date: 04/03/2021 CLINICAL DATA:  sob EXAM: CHEST - 2 VIEW COMPARISON:  Chest x-ray 09/07/2016, CT chest 12/14/2011. FINDINGS: The heart and  mediastinal contours are unchanged. Coronary artery stent. Aortic calcification. The distal descending thoracic aorta/suprarenal abdominal aorta stent noted on lateral view. Three lead cardiac pacemaker/defibrillator overlies the left chest wall. No focal consolidation. No pulmonary edema. Blunting of bilateral costophrenic angles. No pneumothorax. No acute osseous abnormality. IMPRESSION: Blunting of bilateral costophrenic angles. Trace pleural effusions not excluded. Electronically Signed   By: Iven Finn M.D.   On: 04/03/2021 22:56   US RENAL  Result Date: 04/08/2021 CLINICAL DATA:  Renal dysfunction. EXAM: RENAL / URINARY TRACT ULTRASOUND COMPLETE COMPARISON:  Abdominal sonogram done on 06/20/2015 FINDINGS: Right Kidney: Renal measurements: 10.9 x 5.3 x 5 cm = volume: 152 mL. There is no hydronephrosis. There is increased cortical echogenicity. Left Kidney: Renal measurements: 11.2 x 5.5 x 4.5 cm = volume: 147 mL. There is no hydronephrosis. There is increased cortical echogenicity. There is 2 cm cyst in the upper pole. Bladder: Appears normal for degree of bladder distention. Other: None. IMPRESSION: There is no hydronephrosis. Increased cortical echogenicity suggests medical renal disease. 2 cm left renal cyst. Electronically Signed   By: Elmer Picker M.D.   On: 04/08/2021 16:11   US Venous Img Lower Bilateral  Result Date: 04/04/2021 CLINICAL DATA:  Bilateral lower extremity edema, dyspnea EXAM: BILATERAL LOWER EXTREMITY VENOUS DOPPLER ULTRASOUND TECHNIQUE: Gray-scale sonography with compression, as well as color and duplex ultrasound,  were performed to evaluate the deep venous system(s) from the level of the common femoral vein through the popliteal and proximal calf veins. COMPARISON:  None. FINDINGS: VENOUS Normal compressibility of the common femoral, superficial femoral, and popliteal veins, as well as the visualized calf veins. Visualized portions of profunda femoral vein and great  saphenous vein unremarkable. No filling defects to suggest DVT on grayscale or color Doppler imaging. Doppler waveforms show normal direction of venous flow, normal respiratory plasticity and response to augmentation. OTHER None. Limitations: none IMPRESSION: Negative. Electronically Signed   By: Fidela Salisbury M.D.   On: 04/04/2021 03:32   ECHOCARDIOGRAM COMPLETE  Result Date: 04/06/2021    ECHOCARDIOGRAM REPORT   Patient Name:   NAEL PETROSYAN Date of Exam: 04/04/2021 Medical Rec #:  [id number]        Height:       70.0 in Accession #:    [id number]       Weight:       220.0 lb Date of Birth:  Aug 16, 1940       BSA:          2.174 m Patient Age:    4 years         BP:           198/65 mmHg Patient Gender: M                HR:           89 bpm. Exam Location:  [facility] Procedure: 2D Echo, Cardiac Doppler and Color Doppler Indications:     Elevated Troponin  History:         Patient has prior history of Echocardiogram examinations, most                  recent 08/08/2016. CHF, Previous Myocardial Infarction; Risk                  Factors:Hypertension.  Sonographer:     Sherrie Sport Referring Phys:  [id number] YDXAJOIN AGBATA Diagnosing Phys: Yolonda Kida MD  Sonographer Comments: Suboptimal parasternal window. IMPRESSIONS  1. Left ventricular ejection fraction, by estimation, is 50 to 55%. The left ventricle has low normal function. The left ventricle has no regional wall motion abnormalities. The left ventricular internal cavity size was mildly dilated. There is mild left ventricular hypertrophy. Left ventricular diastolic parameters are consistent with Grade II diastolic dysfunction (pseudonormalization).  2. Right ventricular systolic function is low normal. The right ventricular size is moderately enlarged.  3. The mitral valve is normal in structure. Mild mitral valve regurgitation.  4. The aortic valve is normal in structure. Aortic valve regurgitation is mild. FINDINGS  Left Ventricle: Left ventricular  ejection fraction, by estimation, is 50 to 55%. The left ventricle has low normal function. The left ventricle has no regional wall motion abnormalities. The left ventricular internal cavity size was mildly dilated. There is mild left ventricular hypertrophy. Left ventricular diastolic parameters are consistent with Grade II diastolic dysfunction (pseudonormalization). Right Ventricle: The right ventricular size is moderately enlarged. No increase in right ventricular wall thickness. Right ventricular systolic function is low normal. Left Atrium: Left atrial size was normal in size. Right Atrium: Right atrial size was normal in size. Pericardium: There is no evidence of pericardial effusion. Mitral Valve: The mitral valve is normal in structure. Mild mitral valve regurgitation. Tricuspid Valve: The tricuspid valve is normal in structure. Tricuspid valve regurgitation is mild. Aortic Valve: The aortic valve is normal  in structure. Aortic valve regurgitation is mild. Aortic valve mean gradient measures 4.0 mmHg. Aortic valve peak gradient measures 6.7 mmHg. Aortic valve area, by VTI measures 3.09 cm. Pulmonic Valve: The pulmonic valve was normal in structure. Pulmonic valve regurgitation is not visualized. Aorta: The ascending aorta was not well visualized. IAS/Shunts: No atrial level shunt detected by color flow Doppler. Additional Comments: A device lead is visualized. There is no pleural effusion.  LEFT VENTRICLE PLAX 2D LVIDd:         4.90 cm   Diastology LVIDs:         3.30 cm   LV e' medial:    7.07 cm/s LV PW:         1.50 cm   LV E/e' medial:  14.0 LV IVS:        1.05 cm   LV e' lateral:   8.27 cm/s LVOT diam:     2.10 cm   LV E/e' lateral: 12.0 LV SV:         86 LV SV Index:   40 LVOT Area:     3.46 cm  RIGHT VENTRICLE RV Basal diam:  4.40 cm RV S prime:     14.40 cm/s TAPSE (M-mode): 4.7 cm LEFT ATRIUM           Index        RIGHT ATRIUM           Index LA diam:      2.90 cm 1.33 cm/m   RA Area:     27.00  cm LA Vol (A2C): 45.6 ml 20.98 ml/m  RA Volume:   85.90 ml  39.52 ml/m LA Vol (A4C): 56.1 ml 25.81 ml/m  AORTIC VALVE                    PULMONIC VALVE AV Area (Vmax):    3.01 cm     PV Vmax:        0.70 m/s AV Area (Vmean):   3.08 cm     PV Vmean:       44.100 cm/s AV Area (VTI):     3.09 cm     PV VTI:         0.107 m AV Vmax:           129.00 cm/s  PV Peak grad:   1.9 mmHg AV Vmean:          88.400 cm/s  PV Mean grad:   1.0 mmHg AV VTI:            0.279 m      RVOT Peak grad: 3 mmHg AV Peak Grad:      6.7 mmHg AV Mean Grad:      4.0 mmHg LVOT Vmax:         112.00 cm/s LVOT Vmean:        78.700 cm/s LVOT VTI:          0.249 m LVOT/AV VTI ratio: 0.89  AORTA Ao Root diam: 3.40 cm MITRAL VALVE               TRICUSPID VALVE MV Area (PHT): 4.83 cm    TR Peak grad:   40.4 mmHg MV Decel Time: 157 msec    TR Vmax:        318.00 cm/s MV E velocity: 99.00 cm/s MV A velocity: 87.00 cm/s  SHUNTS MV E/A ratio:  1.14        Systemic VTI:  0.25 m                            Systemic Diam: 2.10 cm                            Pulmonic VTI:  0.149 m Yolonda Kida MD Electronically signed by Yolonda Kida MD Signature Date/Time: 04/06/2021/11:32:38 AM    Final       Subjective: No dyspnea or chest pain. Ambulating well without assistance.  Discharge Exam: Vitals:   04/09/21 0753 04/09/21 1149  BP: (!) 168/60 (!) 158/57  Pulse: 88 (!) 59  Resp: 18 18  Temp: 98.1 F (36.7 C) 97.8 F (36.6 C)  SpO2: 97% 98%   Vitals:   04/08/21 2208 04/09/21 0535 04/09/21 0753 04/09/21 1149  BP:  (!) 167/61 (!) 168/60 (!) 158/57  Pulse: 66 97 88 (!) 59  Resp:  20 18 18   Temp:  97.9 F (36.6 C) 98.1 F (36.7 C) 97.8 F (36.6 C)  TempSrc:  Oral Oral   SpO2:  98% 97% 98%  Weight:  93.2 kg    Height:        General: Pt is alert, awake, not in acute distress   The results of significant diagnostics from this hospitalization (including imaging, microbiology, ancillary and laboratory) are listed below for  reference.     Microbiology: Recent Results (from the past 240 hour(s))  Resp Panel by RT-PCR (Flu A&B, Covid) Nasopharyngeal Swab     Status: None   Collection Time: 04/04/21  4:23 AM   Specimen: Nasopharyngeal Swab; Nasopharyngeal(NP) swabs in vial transport medium  Result Value Ref Range Status   SARS Coronavirus 2 by RT PCR NEGATIVE NEGATIVE Final    Comment: (NOTE) SARS-CoV-2 target nucleic acids are NOT DETECTED.  The SARS-CoV-2 RNA is generally detectable in upper respiratory specimens during the acute phase of infection. The lowest concentration of SARS-CoV-2 viral copies this assay can detect is 138 copies/mL. A negative result does not preclude SARS-Cov-2 infection and should not be used as the sole basis for treatment or other patient management decisions. A negative result may occur with  improper specimen collection/handling, submission of specimen other than nasopharyngeal swab, presence of viral mutation(s) within the areas targeted by this assay, and inadequate number of viral copies(<138 copies/mL). A negative result must be combined with clinical observations, patient history, and epidemiological information. The expected result is Negative.  Fact Sheet for Patients:  EntrepreneurPulse.com.au  Fact Sheet for Healthcare Providers:  IncredibleEmployment.be  This test is no t yet approved or cleared by the [place] FDA and  has been authorized for detection and/or diagnosis of SARS-CoV-2 by FDA under an Emergency Use Authorization (EUA). This EUA will remain  in effect (meaning this test can be used) for the duration of the COVID-19 declaration under Section 564(b)(1) of the Act, 21 U.S.C.section 360bbb-3(b)(1), unless the authorization is terminated  or revoked sooner.       Influenza A by PCR NEGATIVE NEGATIVE Final   Influenza B by PCR NEGATIVE NEGATIVE Final    Comment: (NOTE) The Xpert Xpress SARS-CoV-2/FLU/RSV  plus assay is intended as an aid in the diagnosis of influenza from Nasopharyngeal swab specimens and should not be used as a sole basis for treatment. Nasal washings and aspirates are unacceptable for Xpert Xpress SARS-CoV-2/FLU/RSV testing.  Fact Sheet for Patients: EntrepreneurPulse.com.au  Fact Sheet for  Healthcare Providers: IncredibleEmployment.be  This test is not yet approved or cleared by the [place] and has been authorized for detection and/or diagnosis of SARS-CoV-2 by FDA under an Emergency Use Authorization (EUA). This EUA will remain in effect (meaning this test can be used) for the duration of the COVID-19 declaration under Section 564(b)(1) of the Act, 21 U.S.C. section 360bbb-3(b)(1), unless the authorization is terminated or revoked.  Performed at [facility], [address] [place], [place] [address]      Labs: BNP (last 3 results) Recent Labs    04/03/21 2223  BNP 213.0*   Basic Metabolic Panel: Recent Labs  Lab 04/04/21 0025 04/05/21 0846 04/06/21 0512 04/07/21 0505 04/08/21 0457 04/09/21 0606  NA  --  140 140 138 143 142  K  --  3.8 3.6 3.6 3.6 3.5  CL  --  111 107 106 108 108  CO2  --  25 26 26 27 26   GLUCOSE  --  103* 91 131* 94 105*  BUN  --  63* 58* 65* 59* 57*  CREATININE  --  2.84* 2.87* 3.46* 3.42* 3.13*  CALCIUM  --  7.8* 8.1* 7.7* 7.8* 8.1*  MG 1.7  --  1.9 1.9 1.8 1.8   Liver Function Tests: Recent Labs  Lab 04/04/21 0025 04/08/21 1527  AST 14*  --   ALT 30  --   ALKPHOS 73  --   BILITOT 0.9  --   PROT 5.0*  --   ALBUMIN 2.9* 2.8*   Recent Labs  Lab 04/04/21 0025  LIPASE 42    CBC: Recent Labs  Lab 04/04/21 1036 04/05/21 0846 04/06/21 0512 04/07/21 0505 04/08/21 0457 04/09/21 0606  WBC 11.8* 8.5 7.7 8.2 7.6 7.2  NEUTROABS 8.5*  --   --   --   --   --   HGB 11.9* 12.1* 11.7* 11.0* 11.1* 11.8*  HCT 35.9* 36.0* 35.2* 32.9* 34.7* 36.7*  MCV 89.5 87.4  88.2 88.0 89.7 90.0  PLT 115* 114* 104* 94* 85* 90*    Urinalysis    Component Value Date/Time   COLORURINE YELLOW (A) 04/08/2021 1745   APPEARANCEUR CLEAR (A) 04/08/2021 1745   APPEARANCEUR Clear 12/07/2019 1417   LABSPEC 1.014 04/08/2021 1745   PHURINE 6.0 04/08/2021 1745   GLUCOSEU NEGATIVE 04/08/2021 1745   HGBUR NEGATIVE 04/08/2021 1745   BILIRUBINUR NEGATIVE 04/08/2021 1745   BILIRUBINUR Negative 12/07/2019 1417   KETONESUR NEGATIVE 04/08/2021 1745   PROTEINUR >=300 (A) 04/08/2021 1745   UROBILINOGEN 0.2 04/06/2011 0900   NITRITE NEGATIVE 04/08/2021 1745   LEUKOCYTESUR NEGATIVE 04/08/2021 1745    Microbiology Recent Results (from the past 240 hour(s))  Resp Panel by RT-PCR (Flu A&B, Covid) Nasopharyngeal Swab     Status: None   Collection Time: 04/04/21  4:23 AM   Specimen: Nasopharyngeal Swab; Nasopharyngeal(NP) swabs in vial transport medium  Result Value Ref Range Status   SARS Coronavirus 2 by RT PCR NEGATIVE NEGATIVE Final    Comment: (NOTE) SARS-CoV-2 target nucleic acids are NOT DETECTED.  The SARS-CoV-2 RNA is generally detectable in upper respiratory specimens during the acute phase of infection. The lowest concentration of SARS-CoV-2 viral copies this assay can detect is 138 copies/mL. A negative result does not preclude SARS-Cov-2 infection and should not be used as the sole basis for treatment or other patient management decisions. A negative result may occur with  improper specimen collection/handling, submission of specimen other than nasopharyngeal swab, presence of viral mutation(s)  within the areas targeted by this assay, and inadequate number of viral copies(<138 copies/mL). A negative result must be combined with clinical observations, patient history, and epidemiological information. The expected result is Negative.  Fact Sheet for Patients:  EntrepreneurPulse.com.au  Fact Sheet for Healthcare Providers:   IncredibleEmployment.be  This test is no t yet approved or cleared by the [place] FDA and  has been authorized for detection and/or diagnosis of SARS-CoV-2 by FDA under an Emergency Use Authorization (EUA). This EUA will remain  in effect (meaning this test can be used) for the duration of the COVID-19 declaration under Section 564(b)(1) of the Act, 21 U.S.C.section 360bbb-3(b)(1), unless the authorization is terminated  or revoked sooner.       Influenza A by PCR NEGATIVE NEGATIVE Final   Influenza B by PCR NEGATIVE NEGATIVE Final    Comment: (NOTE) The Xpert Xpress SARS-CoV-2/FLU/RSV plus assay is intended as an aid in the diagnosis of influenza from Nasopharyngeal swab specimens and should not be used as a sole basis for treatment. Nasal washings and aspirates are unacceptable for Xpert Xpress SARS-CoV-2/FLU/RSV testing.  Fact Sheet for Patients: EntrepreneurPulse.com.au  Fact Sheet for Healthcare Providers: IncredibleEmployment.be  This test is not yet approved or cleared by the [place] FDA and has been authorized for detection and/or diagnosis of SARS-CoV-2 by FDA under an Emergency Use Authorization (EUA). This EUA will remain in effect (meaning this test can be used) for the duration of the COVID-19 declaration under Section 564(b)(1) of the Act, 21 U.S.C. section 360bbb-3(b)(1), unless the authorization is terminated or revoked.  Performed at [facility], [address] [place], [place] [address]      Time coordinating discharge: 35 minutes  SIGNED:   Cordelia Poche, MD [facility] 04/09/2021, 4:12 PM

## 2021-04-09 NOTE — Plan of Care (Signed)
Nutrition Education Note  RD consulted for nutrition education regarding CHF.  Spoke with pt and daughter Peter [name]) on speakerphone. Pt reports that "it is time I try to follow a low sodium diet". He reports he is aware what foods have sodium in it, but tends to eat a lot of canned food PTA. His daughter is coming from [facility] to care for him and bought him groceries. RD reviewed groceries with daughter and discussed low sodium diet. Pt and daughter very appreciative. Pt is eager to go home.   RD provided "Low Sodium Nutrition Therapy" handout from the Academy of [employer] and [employer]etics. Reviewed patient's dietary recall. Provided examples on ways to decrease sodium intake in diet. Discouraged intake of processed foods and use of salt shaker. Encouraged fresh fruits and vegetables as well as whole grain sources of carbohydrates to maximize fiber intake.   RD discussed why it is important for patient to adhere to diet recommendations, and emphasized the role of fluids, foods to avoid, and importance of weighing self daily. Teach back method used.  Expect fair to good compliance.  Current diet order is 2 gram sodium, patient is consuming approximately 100% of meals at this time. Labs and medications reviewed. No further nutrition interventions warranted at this time. RD contact information provided. If additional nutrition issues arise, please re-consult RD.   [name], RD, LDN, [place] Registered Dietitian II Certified Diabetes Care and Education Specialist Please refer to [facility] for RD and/or RD on-call/weekend/after hours pager

## 2021-04-10 LAB — KAPPA/LAMBDA LIGHT CHAINS
Kappa free light chain: 41 mg/L — ABNORMAL HIGH (ref 3.3–19.4)
Kappa, lambda light chain ratio: 1.08 (ref 0.26–1.65)
Lambda free light chains: 38 mg/L — ABNORMAL HIGH (ref 5.7–26.3)

## 2021-04-11 ENCOUNTER — Other Ambulatory Visit: Payer: Self-pay

## 2021-04-11 ENCOUNTER — Emergency Department
Admission: EM | Admit: 2021-04-11 | Discharge: 2021-04-16 | Disposition: A | Discharge status: Home / Self Care | Payer: PPO | Attending: Emergency Medicine | Admitting: Emergency Medicine

## 2021-04-11 ENCOUNTER — Emergency Department: Payer: PPO

## 2021-04-11 DIAGNOSIS — I493 Ventricular premature depolarization: Secondary | ICD-10-CM | POA: Insufficient documentation

## 2021-04-11 DIAGNOSIS — J90 Pleural effusion, not elsewhere classified: Secondary | ICD-10-CM | POA: Diagnosis not present

## 2021-04-11 DIAGNOSIS — Z20822 Contact with and (suspected) exposure to covid-19: Secondary | ICD-10-CM | POA: Insufficient documentation

## 2021-04-11 DIAGNOSIS — Z85828 Personal history of other malignant neoplasm of skin: Secondary | ICD-10-CM | POA: Diagnosis not present

## 2021-04-11 DIAGNOSIS — I700 Atherosclerosis of aorta: Secondary | ICD-10-CM | POA: Diagnosis not present

## 2021-04-11 DIAGNOSIS — R0602 Shortness of breath: Secondary | ICD-10-CM

## 2021-04-11 DIAGNOSIS — I5023 Acute on chronic systolic (congestive) heart failure: Secondary | ICD-10-CM | POA: Insufficient documentation

## 2021-04-11 DIAGNOSIS — N184 Chronic kidney disease, stage 4 (severe): Secondary | ICD-10-CM | POA: Diagnosis not present

## 2021-04-11 DIAGNOSIS — Z87898 Personal history of other specified conditions: Secondary | ICD-10-CM

## 2021-04-11 DIAGNOSIS — Z95810 Presence of automatic (implantable) cardiac defibrillator: Secondary | ICD-10-CM | POA: Diagnosis not present

## 2021-04-11 DIAGNOSIS — I2510 Atherosclerotic heart disease of native coronary artery without angina pectoris: Secondary | ICD-10-CM | POA: Diagnosis not present

## 2021-04-11 DIAGNOSIS — I130 Hypertensive heart and chronic kidney disease with heart failure and stage 1 through stage 4 chronic kidney disease, or unspecified chronic kidney disease: Secondary | ICD-10-CM | POA: Insufficient documentation

## 2021-04-11 DIAGNOSIS — M79602 Pain in left arm: Secondary | ICD-10-CM | POA: Insufficient documentation

## 2021-04-11 DIAGNOSIS — J439 Emphysema, unspecified: Secondary | ICD-10-CM | POA: Insufficient documentation

## 2021-04-11 DIAGNOSIS — R0600 Dyspnea, unspecified: Secondary | ICD-10-CM | POA: Diagnosis not present

## 2021-04-11 DIAGNOSIS — R0609 Other forms of dyspnea: Secondary | ICD-10-CM

## 2021-04-11 DIAGNOSIS — J45909 Unspecified asthma, uncomplicated: Secondary | ICD-10-CM | POA: Diagnosis not present

## 2021-04-11 DIAGNOSIS — R092 Respiratory arrest: Secondary | ICD-10-CM | POA: Insufficient documentation

## 2021-04-11 DIAGNOSIS — Z79899 Other long term (current) drug therapy: Secondary | ICD-10-CM | POA: Diagnosis not present

## 2021-04-11 DIAGNOSIS — M7989 Other specified soft tissue disorders: Secondary | ICD-10-CM | POA: Diagnosis not present

## 2021-04-11 DIAGNOSIS — Z87891 Personal history of nicotine dependence: Secondary | ICD-10-CM | POA: Insufficient documentation

## 2021-04-11 LAB — PROTEIN ELECTROPHORESIS, SERUM
A/G Ratio: 1.5 (ref 0.7–1.7)
Albumin ELP: 2.9 g/dL (ref 2.9–4.4)
Alpha-1-Globulin: 0.2 g/dL (ref 0.0–0.4)
Alpha-2-Globulin: 0.6 g/dL (ref 0.4–1.0)
Beta Globulin: 0.6 g/dL — ABNORMAL LOW (ref 0.7–1.3)
Gamma Globulin: 0.6 g/dL (ref 0.4–1.8)
Globulin, Total: 2 g/dL — ABNORMAL LOW (ref 2.2–3.9)
Total Protein ELP: 4.9 g/dL — ABNORMAL LOW (ref 6.0–8.5)

## 2021-04-11 LAB — BASIC METABOLIC PANEL
Anion gap: 5 (ref 5–15)
BUN: 53 mg/dL — ABNORMAL HIGH (ref 8–23)
CO2: 26 mmol/L (ref 22–32)
Calcium: 8 mg/dL — ABNORMAL LOW (ref 8.9–10.3)
Chloride: 106 mmol/L (ref 98–111)
Creatinine, Ser: 2.97 mg/dL — ABNORMAL HIGH (ref 0.61–1.24)
GFR, Estimated: 20 mL/min — ABNORMAL LOW (ref >60)
Glucose, Bld: 115 mg/dL — ABNORMAL HIGH (ref 70–99)
Potassium: 3.9 mmol/L (ref 3.5–5.1)
Sodium: 137 mmol/L (ref 135–145)

## 2021-04-11 LAB — CBC
HCT: 34.5 % — ABNORMAL LOW (ref 39.0–52.0)
Hemoglobin: 11.2 g/dL — ABNORMAL LOW (ref 13.0–17.0)
MCH: 29.5 pg (ref 26.0–34.0)
MCHC: 32.5 g/dL (ref 30.0–36.0)
MCV: 90.8 fL (ref 80.0–100.0)
Platelets: 98 10*3/uL — ABNORMAL LOW (ref 150–400)
RBC: 3.8 MIL/uL — ABNORMAL LOW (ref 4.22–5.81)
RDW: 13.6 % (ref 11.5–15.5)
WBC: 6.9 10*3/uL (ref 4.0–10.5)
nRBC: 0 % (ref 0.0–0.2)

## 2021-04-11 LAB — PROTEIN ELECTRO, RANDOM URINE
Albumin ELP, Urine: 77.2 %
Alpha-1-Globulin, U: 5.5 %
Alpha-2-Globulin, U: 3 %
Beta Globulin, U: 7.4 %
Gamma Globulin, U: 6.9 %
Total Protein, Urine: 376.7 mg/dL

## 2021-04-11 LAB — TROPONIN I (HIGH SENSITIVITY): Troponin I (High Sensitivity): 230 ng/L (ref <18)

## 2021-04-11 LAB — BRAIN NATRIURETIC PEPTIDE: B Natriuretic Peptide: 876.6 pg/mL — ABNORMAL HIGH (ref 0.0–100.0)

## 2021-04-11 MED ORDER — ALBUTEROL SULFATE HFA 108 (90 BASE) MCG/ACT IN AERS
2.0000 | INHALATION_SPRAY | RESPIRATORY_TRACT | Status: DC | PRN
  Administered 2021-04-15 – 2021-04-16 (×3): 2 via RESPIRATORY_TRACT
  Filled 2021-04-11 (×2): qty 6.7

## 2021-04-11 NOTE — ED Notes (Addendum)
Pt states arm is more swollen now.

## 2021-04-11 NOTE — ED Triage Notes (Signed)
Pt comes with c/o SOB for few days. Pt states it is worse with exertion. Pt states he was recently admitted for same. Pt states he feels he might need O2 at home. Pt does have some labored breathing and accessory muscle use noted.  Pt denies any CP. Pt states swelling to right arm and noticed it this am. Arm is warm and red. Pt states when he lays down at night he feels like his throat is filling up with water.  Pt also having issues with kidneys per family.

## 2021-04-11 NOTE — ED Notes (Signed)
Lab called critical Troponin value 230.

## 2021-04-11 NOTE — ED Provider Notes (Signed)
Emergency Medicine Provider Triage Evaluation Note  Peter [name] , a 80 y.o. male  was evaluated in triage.  Pt complains of shortness of breath. He was recently admitted for the same. He is also having left arm swelling and pain.  Review of Systems  Positive: Left arm swelling, shortness of breath Negative: Fever, cough  Physical Exam  BP (!) 156/87    Pulse (!) 59    Temp 97.6 F (36.4 C) (Oral)    Resp 20    SpO2 97%  Gen:   Awake, no distress   Resp:  Normal effort  MSK:   Moves extremities without difficulty  Other:    Medical Decision Making  Medically screening exam initiated at 6:07 PM.  Appropriate orders placed.  Peter [name] was informed that the remainder of the evaluation will be completed by another provider, this initial triage assessment does not replace that evaluation, and the importance of remaining in the ED until their evaluation is complete.   Victorino Dike, FNP 04/12/21 2117    Harvest Dark, MD 04/14/21 1221

## 2021-04-11 NOTE — ED Provider Notes (Signed)
[facility] Emergency Department Provider Note   ____________________________________________   Event Date/Time   First MD Initiated Contact with Patient 04/11/21 2052     (approximate)  I have reviewed the triage vital signs and the nursing notes.   HISTORY  Chief Complaint Shortness of Breath    HPI KONNAR BEN is a 80 y.o. male who presents for shortness of breath  LOCATION: Chest DURATION: 2 weeks prior to arrival TIMING: Worsening since discharge from the hospital on 04/03/2021. SEVERITY: Severe QUALITY: Shortness of breath CONTEXT: Patient recently admitted to the hospital for acute on chronic systolic congestive heart failure and acute on chronic kidney injury.  Patient's symptoms seem to improve during hospitalization however when patient got home he found that he was unable to do his normal activities of daily living without becoming acutely short of breath and presyncopal MODIFYING FACTORS: Any exertion or speech worsens his shortness of breath and it is partially relieved at rest or with submental oxygen ASSOCIATED SYMPTOMS: Mild chest tightness   Per medical record review, patient has history of CHF, CAD status post MI, hypertension         Past Medical History:  Diagnosis Date   Anginal pain ([place])    Asthma    Bladder infection, acute 03/2011   "had a whole lot of bleeding from this"   CHF (congestive heart failure) ([place])    Coronary artery disease    High cholesterol    Hypertension    Macular degeneration 12/14/2011   "had it in my right; getting shots now in my left"   Myocardial infarction [facility]) 1996   NSTEMI (non-ST elevated myocardial infarction) ([place]) 12/14/2011   Shortness of breath 12/14/2011   "@ rest, lying down, w/exertion"    Patient Active Problem List   Diagnosis Date Noted   CKD (chronic kidney disease) stage 4, GFR 15-29 ml/min ([place]) 04/04/2021   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   S/P AAA  (abdominal aortic aneurysm) repair 04/04/2021   Acute anemia 04/04/2021   Thrombocytopenia ([place]) 04/04/2021   ABLA (acute blood loss anemia) 04/04/2021   AKI (acute kidney injury) ([place]) 04/04/2021   Symptomatic anemia    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 12/19/2018   CKD (chronic kidney disease) stage 3, GFR 30-59 ml/min (HCC) 05/18/2018   Elevated hemoglobin A1c 05/18/2018   History of skin cancer 05/18/2018   Ischemic cardiomyopathy 12/16/2017   S/P ICD (internal cardiac defibrillator) procedure 12/16/2017   Acute on chronic systolic CHF (congestive heart failure) ([place]) 09/07/2016   Congestive heart failure ([place]) 01/17/2015   Current tobacco use 01/17/2015   Hypercholesterolemia 01/17/2015   Peripheral vascular disease ([place]) 01/17/2015   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   Arthritis, degenerative 07/22/2013   H/O angina pectoris 07/22/2013   Adiposity 07/22/2013   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   Hypokalemia 12/15/2011   SOB (shortness of breath) 12/14/2011   CAD (coronary artery disease) 12/14/2011   HTN (hypertension) 12/14/2011   Hyperlipidemia 12/14/2011   GERD (gastroesophageal reflux disease) 12/14/2011   BPH (benign prostatic hyperplasia) 12/14/2011   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/14/2011   Aneurysm of thoracic aorta 12/14/2011    Past Surgical History:  Procedure Laterality Date   CORONARY ANGIOPLASTY WITH [place]; ~ 2003; ~ 2008   "total of 3"   CORONARY STENT INTERVENTION N/A 08/10/2016   Procedure: Coronary Stent Intervention;  Surgeon: [name], MD;  Location: [facility];  Service: Cardiovascular;  Laterality: N/A;   HERNIA REPAIR  ~ 2001   "abdominal w/mesh implanted"   LEFT HEART CATH AND CORONARY ANGIOGRAPHY N/A 08/10/2016   Procedure: Left Heart Cath and Coronary Angiography;  Surgeon: [name], MD;  Location: [facility];   Service: Cardiovascular;  Laterality: N/A;   LEFT HEART CATHETERIZATION WITH CORONARY ANGIOGRAM N/A 12/16/2011   Procedure: LEFT HEART CATHETERIZATION WITH CORONARY ANGIOGRAM;  Surgeon: Minus Breeding, MD;  Location: [facility] CATH LAB;  Service: Cardiovascular;  Laterality: N/A;   PERCUTANEOUS CORONARY STENT INTERVENTION (PCI-S) N/A 12/17/2011   Procedure: PERCUTANEOUS CORONARY STENT INTERVENTION (PCI-S);  Surgeon: Sherren Mocha, MD;  Location: [facility] CATH LAB;  Service: Cardiovascular;  Laterality: N/A;   TONSILLECTOMY     "I was a kid"    Prior to Admission medications   Medication Sig Start Date End Date Taking? Authorizing Provider  atorvastatin (LIPITOR) 40 MG tablet Take 1 tablet (40 mg total) by mouth daily at 6 PM. 12/18/11  Yes Annita Brod, MD  carvedilol (COREG) 6.25 MG tablet Take 6.25 mg by mouth 2 (two) times daily with a meal.   Yes [provider]  cetirizine (ZYRTEC) 10 MG tablet Take 10 mg by mouth.   Yes [provider]  clopidogrel (PLAVIX) 75 MG tablet TAKE 1 TABLET(75 MG) BY MOUTH EVERY DAY 08/11/16  Yes Fritzi Mandes, MD  cyanocobalamin 1000 MCG tablet Take 1,000 mcg by mouth daily.    Yes [provider]  finasteride (PROSCAR) 5 MG tablet Take 1 tablet (5 mg total) by mouth daily. 02/22/20  Yes Billey Co, MD  GLUCOSAMINE-CHONDROITIN-VIT C PO Take 1 tablet by mouth daily.   Yes [provider]  hydrALAZINE (APRESOLINE) 100 MG tablet Take 1 tablet (100 mg total) by mouth every 8 (eight) hours. 04/08/21 07/07/21 Yes Enzo Bi, MD  isosorbide mononitrate (IMDUR) 30 MG 24 hr tablet Take 30 mg by mouth daily. 06/18/16  Yes [provider]  omeprazole (PRILOSEC) 20 MG capsule Take by mouth. 01/16/16  Yes [provider]  tamsulosin (FLOMAX) 0.4 MG CAPS capsule Take 1 capsule (0.4 mg total) by mouth daily. 02/22/20  Yes Billey Co, MD  clobetasol cream (TEMOVATE) 0.05 % APPLY TO PSORIASIS BID PRN UNTIL SMOOTH 11/05/14   [provider]  furosemide (LASIX) 20 MG tablet Hold this medication until followup with nephrology or cardiology. 04/08/21   Enzo Bi, MD  losartan (COZAAR) 100 MG tablet Hold until followup with your outpatient doctor due to worsening kidney function. 04/08/21   Enzo Bi, MD  spironolactone (ALDACTONE) 25 MG tablet Hold until followup with your outpatient doctor due to worsening kidney function. 04/08/21   Enzo Bi, MD    Allergies Iodinated contrast media and Iodinated glycerol  [glycerol, iodinated]  Family History  Family history unknown: Yes    Social History Social History   Tobacco Use   Smoking status: Former    Packs/day: 0.50    Years: 0.50    Pack years: 0.25    Types: Cigarettes   Smokeless tobacco: Never   Tobacco comments:    11/23/16 Still not ready to quit smoking.  Substance Use Topics   Alcohol use: Yes    Alcohol/week: 1.0 standard drink    Types: 1 Cans of beer per week    Comment: 12/14/2011 "if my kidneys are sore, I drink 1 beer/day; if not sore; no beer; occasionally drink socially; ave 1 beer/wk maybe"   Drug use: No  Review of Systems Constitutional: No fever/chills Eyes: No visual changes. ENT: No sore throat. Cardiovascular: Denies chest pain. Respiratory: Endorses shortness of breath. Gastrointestinal: No abdominal pain.  No nausea, no vomiting.  No diarrhea. Genitourinary: Negative for dysuria. Musculoskeletal: Negative for acute arthralgias Skin: Negative for rash. Neurological: Negative for headaches, weakness/numbness/paresthesias in any extremity Psychiatric: Negative for suicidal ideation/homicidal ideation ____________________________________________  PHYSICAL EXAM:  VITAL SIGNS: ED Triage Vitals  Enc Vitals Group     BP 04/11/21 1414 (!) 156/87     Pulse Rate 04/11/21 1414 (!) 59     Resp 04/11/21 1414 20     Temp 04/11/21 1414 97.6 F (36.4 C)     Temp Source 04/11/21 1414 Oral     SpO2 04/11/21 1414 97 %     Weight  --      Height --      Head Circumference --      Peak Flow --      Pain Score 04/11/21 1421 0     Pain Loc --      Pain Edu? --      Excl. in [place]? --    Constitutional: Alert and oriented. Well appearing and in no acute distress. Eyes: Conjunctivae are normal. PERRL. Head: Atraumatic. Nose: No congestion/rhinnorhea. Mouth/Throat: Mucous membranes are moist. Neck: No stridor Cardiovascular: Grossly normal heart sounds.  Good peripheral circulation. Respiratory: Normal respiratory effort.  No retractions. Gastrointestinal: Soft and nontender. No distention. Musculoskeletal: No obvious deformities Neurologic:  Normal speech and language. No gross focal neurologic deficits are appreciated. Skin:  Skin is warm and dry. No rash noted. Psychiatric: Mood and affect are normal. Speech and behavior are normal.  ____________________________________________   LABS (all labs ordered are listed, but only abnormal results are displayed)  Labs Reviewed  CBC - Abnormal; Notable for the following components:      Result Value   RBC 3.80 (*)    Hemoglobin 11.2 (*)    HCT 34.5 (*)    Platelets 98 (*)    All other components within normal limits  BASIC METABOLIC PANEL - Abnormal; Notable for the following components:   Glucose, Bld 115 (*)    BUN 53 (*)    Creatinine, Ser 2.97 (*)    Calcium 8.0 (*)    GFR, Estimated 20 (*)    All other components within normal limits  BRAIN NATRIURETIC PEPTIDE - Abnormal; Notable for the following components:   B Natriuretic Peptide 876.6 (*)    All other components within normal limits  D-DIMER, QUANTITATIVE - Abnormal; Notable for the following components:   D-Dimer, Quant 2.83 (*)    All other components within normal limits  TROPONIN I (HIGH SENSITIVITY) - Abnormal; Notable for the following components:   Troponin I (High Sensitivity) 222 (*)    All other components within normal limits  TROPONIN I (HIGH SENSITIVITY) - Abnormal; Notable for the  following components:   Troponin I (High Sensitivity) 230 (*)    All other components within normal limits  TROPONIN I (HIGH SENSITIVITY) - Abnormal; Notable for the following components:   Troponin I (High Sensitivity) 224 (*)    All other components within normal limits  RESP PANEL BY RT-PCR (FLU A&B, COVID) ARPGX2  MAGNESIUM   ____________________________________________  EKG  ED ECG REPORT I, Naaman Plummer, the attending physician, personally viewed and interpreted this ECG.  Date: 04/11/2021 EKG Time: 1434 Rate: 71 Rhythm: Undetermined rhythm, possible sinus arrhythmia with multiple PVCs QRS Axis: normal  Intervals: normal ST/T Wave abnormalities: normal Narrative Interpretation: no evidence of acute ischemia  ____________________________________________  RADIOLOGY  ED MD interpretation: 2 view chest x-ray shows no evidence of acute abnormalities including no pneumonia, pneumothorax, or widened mediastinum  Doppler ultrasound of the left upper extremity shows no evidence of DVT  Official radiology report(s): CT CHEST WO CONTRAST  Result Date: 04/12/2021 CLINICAL DATA:  Shortness of breath. EXAM: CT CHEST WITHOUT CONTRAST TECHNIQUE: Multidetector CT imaging of the chest was performed following the standard protocol without IV contrast. COMPARISON:  December 14, 2011 FINDINGS: Cardiovascular: A dual lead AICD is in place. There is moderate to marked severity calcification of the aortic arch and descending thoracic aorta. 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 A stent is noted within the distal aspect of the descending thoracic aorta. This extends to include the visualized portion of the abdominal aorta. Normal heart size with coronary artery stents in place. No pericardial effusion. Mediastinum/Nodes: There is mild AP window and pretracheal lymphadenopathy. Thyroid gland, trachea, and esophagus demonstrate no significant findings. Lungs/Pleura: There is  mild emphysematous lung disease involving the bilateral upper lobes. Mild areas of linear scarring and/or atelectasis are noted within the posterior aspects of the bilateral lung bases. There are very small bilateral pleural effusions. No pneumothorax is identified. Upper Abdomen: A thin layer of tiny gallstones is seen within the dependent portion of the gallbladder. Vascular stents are seen within the origins of the celiac artery, superior mesenteric artery and bilateral renal arteries. Musculoskeletal: Multilevel degenerative changes seen throughout the thoracic spine. IMPRESSION: 1. Mild emphysematous lung disease. 2. Mild bibasilar linear scarring and/or atelectasis. 3. Very small bilateral pleural effusions. 4. Extensive stenting of the distal descending thoracic aorta, as well as the abdominal aorta and several of its branches. 5. Cholelithiasis. Aortic Atherosclerosis (ICD10-I70.0) and Emphysema (ICD10-J43.9). Electronically Signed   By: Virgina Norfolk M.D.   On: 04/12/2021 04:11   NM Pulmonary Perfusion  Result Date: 04/12/2021 CLINICAL DATA:  Evaluate for pulmonary embolus. Shortness of breath. EXAM: NUCLEAR MEDICINE PERFUSION LUNG SCAN TECHNIQUE: Perfusion images were obtained in multiple projections after intravenous injection of radiopharmaceutical. Ventilation scans intentionally deferred if perfusion scan and chest x-ray adequate for interpretation during COVID 19 epidemic. RADIOPHARMACEUTICALS:  4.3 mCi Tc-19m MAA IV COMPARISON:  Chest radiograph 04/11/21 FINDINGS: On the chest radiograph from 04/11/2021 there is a ICD overlying the left chest wall. No focal pulmonary opacities identified. On the perfusion portion of the examination there is a focal peripheral defect overlying the left upper lobe corresponding to the ICD battery pack. Mild heterogeneous distribution of the radiopharmaceutical is noted within both lungs. On the lateral projection images there are large nonsegmental areas of  diminished activity within both posterior and upper lung zones which likely represents non-uniform soft tissue attenuation artifact secondary to patient's upper extremities. No peripheral segmental perfusion defects to suggest acute pulmonary embolus. IMPRESSION: 1. No evidence for acute pulmonary embolus. Electronically Signed   By: Kerby Moors M.D.   On: 04/12/2021 11:01    ____________________________________________   PROCEDURES  Procedure(s) performed (including Critical Care):  Procedures   ____________________________________________   INITIAL IMPRESSION / ASSESSMENT AND PLAN / ED COURSE  As part of my medical decision making, I reviewed the following data within the electronic medical record, if available:  Nursing notes reviewed and incorporated, Labs reviewed, EKG interpreted, Old chart reviewed, Radiograph reviewed and Notes from prior ED visits reviewed and incorporated  The patient is suffering from shortness of breath, but the immediate cause is not apparent.  Potential causes considered include, but are not limited to, asthma or COPD, congestive heart failure, pulmonary embolism, pneumothorax, coronary syndrome, pneumonia, and pleural effusion.  Patient has been unable to ambulate here without significant respiratory distress and therefore will require evaluation by physical/occupational therapy and likely placement in a rehab facility.  Dispo: Pending PT/OT evaluation     ____________________________________________   FINAL CLINICAL IMPRESSION(S) / ED DIAGNOSES  Final diagnoses:  Shortness of breath  DOE (dyspnea on exertion)  Hx of cardiorespiratory failure  Frequent PVCs  Pulmonary emphysema, unspecified emphysema type [facility])     ED Discharge Orders     None        Note:  This document was prepared using Dragon voice recognition software and may include unintentional dictation errors.    Naaman Plummer, MD 04/12/21 2227

## 2021-04-12 ENCOUNTER — Emergency Department: Payer: PPO

## 2021-04-12 ENCOUNTER — Other Ambulatory Visit: Payer: Self-pay

## 2021-04-12 DIAGNOSIS — J90 Pleural effusion, not elsewhere classified: Secondary | ICD-10-CM | POA: Diagnosis not present

## 2021-04-12 DIAGNOSIS — J439 Emphysema, unspecified: Secondary | ICD-10-CM | POA: Diagnosis not present

## 2021-04-12 DIAGNOSIS — Z95810 Presence of automatic (implantable) cardiac defibrillator: Secondary | ICD-10-CM | POA: Diagnosis not present

## 2021-04-12 DIAGNOSIS — I700 Atherosclerosis of aorta: Secondary | ICD-10-CM | POA: Diagnosis not present

## 2021-04-12 DIAGNOSIS — R0602 Shortness of breath: Secondary | ICD-10-CM | POA: Diagnosis not present

## 2021-04-12 LAB — D-DIMER, QUANTITATIVE: D-Dimer, Quant: 2.83 ug/mL-FEU — ABNORMAL HIGH (ref 0.00–0.50)

## 2021-04-12 LAB — RESP PANEL BY RT-PCR (FLU A&B, COVID) ARPGX2
Influenza A by PCR: NEGATIVE
Influenza B by PCR: NEGATIVE
SARS Coronavirus 2 by RT PCR: NEGATIVE

## 2021-04-12 LAB — MAGNESIUM: Magnesium: 1.7 mg/dL (ref 1.7–2.4)

## 2021-04-12 LAB — TROPONIN I (HIGH SENSITIVITY)
Troponin I (High Sensitivity): 224 ng/L (ref <18)
Troponin I (High Sensitivity): 222 ng/L (ref <18)

## 2021-04-12 MED ORDER — TECHNETIUM TO 99M ALBUMIN AGGREGATED
4.0000 | Freq: Once | INTRAVENOUS | Status: AC | PRN
  Administered 2021-04-12: 4.3 via INTRAVENOUS

## 2021-04-12 MED ORDER — TAMSULOSIN HCL 0.4 MG PO CAPS
0.4000 mg | ORAL_CAPSULE | Freq: Every day | ORAL | Status: DC
  Administered 2021-04-13 – 2021-04-16 (×4): 0.4 mg via ORAL
  Filled 2021-04-12 (×4): qty 1

## 2021-04-12 MED ORDER — CARVEDILOL 6.25 MG PO TABS
6.2500 mg | ORAL_TABLET | Freq: Two times a day (BID) | ORAL | Status: DC
  Administered 2021-04-12 – 2021-04-16 (×8): 6.25 mg via ORAL
  Filled 2021-04-12 (×8): qty 1

## 2021-04-12 MED ORDER — CLOPIDOGREL BISULFATE 75 MG PO TABS
75.0000 mg | ORAL_TABLET | Freq: Every day | ORAL | Status: DC
  Administered 2021-04-12 – 2021-04-16 (×5): 75 mg via ORAL
  Filled 2021-04-12 (×5): qty 1

## 2021-04-12 MED ORDER — ISOSORBIDE MONONITRATE ER 60 MG PO TB24
30.0000 mg | ORAL_TABLET | Freq: Every day | ORAL | Status: DC
  Administered 2021-04-12 – 2021-04-16 (×5): 30 mg via ORAL
  Filled 2021-04-12 (×5): qty 1

## 2021-04-12 MED ORDER — PANTOPRAZOLE SODIUM 40 MG PO TBEC
40.0000 mg | DELAYED_RELEASE_TABLET | Freq: Every day | ORAL | Status: DC
  Administered 2021-04-12 – 2021-04-16 (×5): 40 mg via ORAL
  Filled 2021-04-12 (×5): qty 1

## 2021-04-12 MED ORDER — ATORVASTATIN CALCIUM 20 MG PO TABS
40.0000 mg | ORAL_TABLET | Freq: Every day | ORAL | Status: DC
  Administered 2021-04-12 – 2021-04-15 (×4): 40 mg via ORAL
  Filled 2021-04-12 (×4): qty 2

## 2021-04-12 MED ORDER — FINASTERIDE 5 MG PO TABS
5.0000 mg | ORAL_TABLET | Freq: Every day | ORAL | Status: DC
  Administered 2021-04-14 – 2021-04-16 (×3): 5 mg via ORAL
  Filled 2021-04-12 (×5): qty 1

## 2021-04-12 MED ORDER — HYDRALAZINE HCL 50 MG PO TABS
100.0000 mg | ORAL_TABLET | Freq: Three times a day (TID) | ORAL | Status: DC
  Administered 2021-04-12 – 2021-04-16 (×10): 100 mg via ORAL
  Filled 2021-04-12 (×10): qty 2

## 2021-04-12 NOTE — ED Provider Notes (Addendum)
2:00 AM  Pt is an 80 year old male here for shortness of breath.  Recently admitted for CHF exacerbation and acute on chronic kidney injury.  Just discharged home and states he was not feeling better on discharge.  Not currently on oxygen but has been intermittently tachypneic here and has had significant ectopy.  EKG here shows sinus arrhythmia with left bundle branch block.  Chest x-ray shows no acute abnormality but will obtain CT of the chest without contrast to evaluate for CHF, pneumonia.  No pneumothorax seen.  Troponin elevated which appears to be stable from recent admission but will check second troponin.  Will check magnesium level also given his ectopy.  Patient may need medical admission.   4:20 AM  Pt's repeat troponin is stable and shows that it is downtrending.  Magnesium level is normal.  CT of his chest without contrast shows emphysematous changes and very small bilateral pleural effusions but no sign of a CHF exacerbation.  Suspect that the emphysema may be why he is short of breath but he states he is feeling better and not requiring oxygen at this time.  He did have an elevated D-dimer but cannot get a CT scan with contrast due to his renal function.  We will order a VQ scan which can be done in the morning.  If this is negative for PE, I feel his medical work-up has been complete and he can be seen by social work and case management for further outpatient needs as planned by previous provider.  Patient also comfortable with this plan.  He states he would like to be discharged home if possible and does not want to be placed into a nursing facility but feels that he needs more resources at home including physical therapy.  5:10 AM  Pt able to ambulate without desaturation but did have some increased work of breathing per nursing staff.  Suspect that this is due to his previous tobacco use, emphysema and deconditioning from recent hospitalization but again VQ scan is pending for full  work-up.  Patient will be signed out the oncoming ED team.  I reviewed all nursing notes and pertinent previous records as available.  I have reviewed and interpreted any EKGs, lab and urine results, imaging (as available).    Marshal Schrecengost, Delice Bison, DO 04/12/21 [phone]

## 2021-04-12 NOTE — ED Notes (Signed)
Pt and daughter requesting hospital bed. Also asking about breakfast. Also asking about home meds because pt did not have all home AM meds. Informed them would check on these things.

## 2021-04-12 NOTE — ED Provider Notes (Signed)
----------------------------------------- °  11:38 AM on 04/12/2021 ----------------------------------------- Patient's VQ scan is negative for PE.  Medical work-up does not appear to show any acute reason for hospitalization however given the patient's weakness daughter does not feel the patient could be safely discharged home.  We are currently awaiting social work as well as physical therapy evaluation to help with disposition.   Harvest Dark, MD 04/12/21 1139

## 2021-04-12 NOTE — TOC Progression Note (Addendum)
Transition of Care [facility]) - Progression Note    Patient Details  Name: Peter [name] MRN: [id number] Date of Birth: January 08, 1941  [facility] CM/SW [place], RN Phone Number: 04/12/2021, 3:36 PM  Clinical Narrative:    Received call from dtr, Robin requesting update. Updated on bed search and no offers at this time. Daughter is hopeful for [facility] but understands limited bed capacity and possibly no bed offer.    Expected Discharge Plan: Skilled Nursing Facility Barriers to Discharge: SNF Pending bed offer  Expected Discharge Plan and Services Expected Discharge Plan: Day arrangements for the past 2 months: Single Family Home                                       Social Determinants of Health (SDOH) Interventions    Readmission Risk Interventions No flowsheet data found.

## 2021-04-12 NOTE — NC FL2 (Signed)
[place] LEVEL OF CARE SCREENING TOOL     IDENTIFICATION  Patient Name: Peter [name] Birthdate: [birth year] Sex: male Admission Date (Current Location): 04/11/2021  [facility] and [place] Number:  Engineering geologist and Address:  [facility], [address], [place], [place] [address]      Provider Number: [id number]  Attending Physician Name and Address:  Harvest Dark, MD  Relative Name and Phone Number:       Current Level of Care: Hospital Recommended Level of Care: [place] Prior Approval Number:    Date Approved/Denied:   PASRR Number:    Discharge Plan: SNF    Current Diagnoses: Patient Active Problem List   Diagnosis Date Noted   CKD (chronic kidney disease) stage 4, GFR 15-29 ml/min (HCC) 04/04/2021   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   S/P AAA (abdominal aortic aneurysm) repair 04/04/2021   Acute anemia 04/04/2021   Thrombocytopenia ([place]) 04/04/2021   ABLA (acute blood loss anemia) 04/04/2021   AKI (acute kidney injury) ([place]) 04/04/2021   Symptomatic anemia    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 12/19/2018   CKD (chronic kidney disease) stage 3, GFR 30-59 ml/min (HCC) 05/18/2018   Elevated hemoglobin A1c 05/18/2018   History of skin cancer 05/18/2018   Ischemic cardiomyopathy 12/16/2017   S/P ICD (internal cardiac defibrillator) procedure 12/16/2017   Acute on chronic systolic CHF (congestive heart failure) ([place]) 09/07/2016   Congestive heart failure ([place]) 01/17/2015   Current tobacco use 01/17/2015   Hypercholesterolemia 01/17/2015   Peripheral vascular disease ([place]) 01/17/2015   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   Arthritis, degenerative 07/22/2013   H/O angina pectoris 07/22/2013   Adiposity 07/22/2013   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   Hypokalemia 12/15/2011   SOB (shortness of breath)  12/14/2011   CAD (coronary artery disease) 12/14/2011   HTN (hypertension) 12/14/2011   Hyperlipidemia 12/14/2011   GERD (gastroesophageal reflux disease) 12/14/2011   BPH (benign prostatic hyperplasia) 12/14/2011   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/14/2011   Aneurysm of thoracic aorta 12/14/2011    Orientation RESPIRATION BLADDER Height & Weight     Self, Time, Situation, Place  Normal Continent Weight: 93 kg Height:  5\' 11"  (180.3 cm)  BEHAVIORAL SYMPTOMS/MOOD NEUROLOGICAL BOWEL NUTRITION STATUS      Continent Diet (Regular)  AMBULATORY STATUS COMMUNICATION OF NEEDS Skin   Limited Assist Verbally Normal                       Personal Care Assistance Level of Assistance  Bathing, Feeding, Dressing Bathing Assistance: Limited assistance Feeding assistance: Limited assistance Dressing Assistance: Limited assistance     Functional Limitations Info  Hearing   Hearing Info: Impaired (Hard of Hearing)      SPECIAL CARE FACTORS FREQUENCY  PT (By licensed PT), OT (By licensed OT)     PT Frequency: Min 5 xweek OT Frequency: Min 5xweek            Contractures      Additional Factors Info                  Current Medications (04/12/2021):  This is the current hospital active medication list Current Facility-Administered Medications  Medication Dose Route Frequency Provider Last Rate Last Admin   albuterol (VENTOLIN HFA) 108 (90 Base) MCG/ACT inhaler 2 puff  2 puff Inhalation Q2H PRN Vladimir Crofts, MD       atorvastatin (  LIPITOR) tablet 40 mg  40 mg Oral q1800 Ward, Kristen N, DO       carvedilol (COREG) tablet 6.25 mg  6.25 mg Oral BID WC Ward, Kristen N, DO   6.25 mg at 04/12/21 8413   clopidogrel (PLAVIX) tablet 75 mg  75 mg Oral Daily Ward, Kristen N, DO   75 mg at 04/12/21 2440   finasteride (PROSCAR) tablet 5 mg  5 mg Oral Daily Ward, Kristen N, DO       hydrALAZINE (APRESOLINE) tablet 100 mg  100 mg Oral Q8H Ward, Kristen N, DO   100 mg at 04/12/21 1027    isosorbide mononitrate (IMDUR) 24 hr tablet 30 mg  30 mg Oral Daily Ward, Kristen N, DO   30 mg at 04/12/21 0914   pantoprazole (PROTONIX) EC tablet 40 mg  40 mg Oral Daily Ward, Kristen N, DO   40 mg at 04/12/21 0913   Current Outpatient Medications  Medication Sig Dispense Refill   atorvastatin (LIPITOR) 40 MG tablet Take 1 tablet (40 mg total) by mouth daily at 6 PM. 30 tablet 0   carvedilol (COREG) 6.25 MG tablet Take 6.25 mg by mouth 2 (two) times daily with a meal.     cetirizine (ZYRTEC) 10 MG tablet Take 10 mg by mouth.     clopidogrel (PLAVIX) 75 MG tablet TAKE 1 TABLET(75 MG) BY MOUTH EVERY DAY 30 tablet 1   cyanocobalamin 1000 MCG tablet Take 1,000 mcg by mouth daily.      finasteride (PROSCAR) 5 MG tablet Take 1 tablet (5 mg total) by mouth daily. 90 tablet 3   GLUCOSAMINE-CHONDROITIN-VIT C PO Take 1 tablet by mouth daily.     hydrALAZINE (APRESOLINE) 100 MG tablet Take 1 tablet (100 mg total) by mouth every 8 (eight) hours. 270 tablet 0   isosorbide mononitrate (IMDUR) 30 MG 24 hr tablet Take 30 mg by mouth daily.     omeprazole (PRILOSEC) 20 MG capsule Take by mouth.     tamsulosin (FLOMAX) 0.4 MG CAPS capsule Take 1 capsule (0.4 mg total) by mouth daily. 90 capsule 3   clobetasol cream (TEMOVATE) 0.05 % APPLY TO PSORIASIS BID PRN UNTIL SMOOTH  1   furosemide (LASIX) 20 MG tablet Hold this medication until followup with nephrology or cardiology. 30 tablet    losartan (COZAAR) 100 MG tablet Hold until followup with your outpatient doctor due to worsening kidney function.     spironolactone (ALDACTONE) 25 MG tablet Hold until followup with your outpatient doctor due to worsening kidney function.       Discharge Medications: Please see discharge summary for a list of discharge medications.  Relevant Imaging Results:  Relevant Lab Results:   Additional Information SS#[id number]  Anselm Pancoast, RN

## 2021-04-12 NOTE — ED Notes (Signed)
Patient provided with warm blankets.  Lights in room dimmed.  No complaints voiced at this time.

## 2021-04-12 NOTE — ED Notes (Addendum)
Ambulated patient in the hall several feet. Monitored pulse oximetry during walk, oxygen saturations remained in the high 90's while on RA. Increased RR upper 30's. Patient rated difficulty of breathing a 5/5 during ambulation. Dr. Leonides Schanz updated.

## 2021-04-12 NOTE — ED Notes (Signed)
Daughter [name] concerned stating that pt should not be discharged home. Daughter very concerned about pt respiratory status and that when pt walks he cannot breathe and also she states that pt was confused this morning and did not recognize her at first. Daughter states that if pt is discharged, he should go to SNF or rehab. She also is concerned about his low HR. Will inform ED provider. No assigned attending at this time.

## 2021-04-12 NOTE — ED Notes (Signed)
Verbal order given to d/c cardiac monitoring.

## 2021-04-12 NOTE — TOC PASRR Note (Signed)
PASRR completed: [id number]

## 2021-04-12 NOTE — TOC Initial Note (Signed)
Transition of Care [facility]) - Initial/Assessment Note    Patient Details  Name: Peter [name] MRN: [id number] Date of Birth: 15-May-1940  Transition of Care [facility] CM/SW Contact:    Anselm Pancoast, RN Phone Number: 04/12/2021, 12:27 PM  Clinical Narrative:                 Spoke to patient and daughter regarding discharge plan. Patient and family are requesting SNF placement due to patient lives alone with little support. Patient had recent hospitalization and has had multiple episodes of sickness over the last few months resulting in increased weakness and difficulty with completing ADL's at home. Discussed process for SNF placement and patient request for [facility]. Confirmed patient also has LTC policy that could assist after rehab and at discharge with resources at home.   Expected Discharge Plan: Skilled Nursing Facility Barriers to Discharge: SNF Pending bed offer   Patient Goals and CMS Choice Patient states their goals for this hospitalization and ongoing recovery are:: Get strong enough in rehab to return home      Expected Discharge Plan and Services Expected Discharge Plan: [place] arrangements for the past 2 months: Single Family Home                                      Prior Living Arrangements/Services Living arrangements for the past 2 months: Single Family Home Lives with:: Self Patient language and need for interpreter reviewed:: Yes Do you feel safe going back to the place where you live?: Yes      Need for Family Participation in Patient Care: Yes (Comment) Care giver support system in place?: Yes (comment)   Criminal Activity/Legal Involvement Pertinent to Current Situation/Hospitalization: No - Comment as needed  Activities of Daily Living      Permission Sought/Granted Permission sought to share information with : Family Supports, Case Manager Permission granted to share information with : Yes, Verbal  Permission Granted  Share Information with NAME: Daughter-Robin           Emotional Assessment Appearance:: Appears stated age Attitude/Demeanor/Rapport: Engaged Affect (typically observed): Accepting Orientation: : Oriented to Self, Oriented to Place, Oriented to  Time, Oriented to Situation Alcohol / Substance Use: Tobacco Use (quit smoking 1 month ago) Psych Involvement: No (comment)  Admission diagnosis:  abnormal labs Patient Active Problem List   Diagnosis Date Noted   CKD (chronic kidney disease) stage 4, GFR 15-29 ml/min (HCC) 04/04/2021   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   S/P AAA (abdominal aortic aneurysm) repair 04/04/2021   Acute anemia 04/04/2021   Thrombocytopenia ([place]) 04/04/2021   ABLA (acute blood loss anemia) 04/04/2021   AKI (acute kidney injury) ([place]) 04/04/2021   Symptomatic anemia    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 12/19/2018   CKD (chronic kidney disease) stage 3, GFR 30-59 ml/min (HCC) 05/18/2018   Elevated hemoglobin A1c 05/18/2018   History of skin cancer 05/18/2018   Ischemic cardiomyopathy 12/16/2017   S/P ICD (internal cardiac defibrillator) procedure 12/16/2017   Acute on chronic systolic CHF (congestive heart failure) ([place]) 09/07/2016   Congestive heart failure ([place]) 01/17/2015   Current tobacco use 01/17/2015   Hypercholesterolemia 01/17/2015   Peripheral vascular disease ([place]) 01/17/2015   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   Arthritis, degenerative 07/22/2013   H/O angina pectoris 07/22/2013   Adiposity 07/22/2013  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   Hypokalemia 12/15/2011   SOB (shortness of breath) 12/14/2011   CAD (coronary artery disease) 12/14/2011   HTN (hypertension) 12/14/2011   Hyperlipidemia 12/14/2011   GERD (gastroesophageal reflux disease) 12/14/2011   BPH (benign prostatic hyperplasia) 12/14/2011   Aneurysm of aortic arch  12/14/2011   Aneurysm of thoracic aorta 12/14/2011   PCP:  Tracie Harrier, MD Pharmacy:   [facility] [place], [place] [address] [place] [address] Phone: [phone] Fax: [phone]     Social Determinants of Health (SDOH) Interventions    Readmission Risk Interventions No flowsheet data found.

## 2021-04-12 NOTE — ED Notes (Signed)
Pt given a warm blanket and a cup of water.

## 2021-04-12 NOTE — Evaluation (Signed)
Physical Therapy Evaluation Patient Details Name: Peter [name] MRN: [id number] DOB: 15-Jan-1941 Today's Date: 04/12/2021  History of Present Illness  NICKOLAS CHALFIN is a 80 y.o. male with medical history significant for coronary artery disease status post PCI with stent angioplasty, hypertension, anemia of chronic disease, chronic diastolic dysfunction CH.  He presented to hospital ED on 12/31 for shortness of breath.  Recently admitted for CHF exacerbation and acute on chronic kidney injury.  Just discharged home and states he was not feeling better on discharge. Patient's VQ scan is negative for PE.  Medical work-up does not appear to show any acute reason for hospitalization however given the patient's weakness daughter does not feel the patient could be safely discharged home.   Clinical Impression  Patient sitting at edge of bed reading upon PT arrival. Requests daughter [name] participate by phone and she was on speaker phone throughout the session, interacting with PT as needed/desired by patient/family. Patient was alert and oriented x 4 and able to provide detailed history. He lives alone in a single story duplex with 4 steps to enter. He uses a walk in shower with no useable seat for bathing. Patient/family report a significant decline in functional ability and independence since he was sick with RSV and Flu at the end of November this year. Prior to that time he was independent in all aspects of care and mobility including driving. He uses no assistive devices and reports no falls in the last 6 months. Patient was hospitalized from 12/23 to 12/28 of this year and upon discharge he found he could not don his own socks, prepare his own meals, or clean his home due to shortness of breath. He was able to shower with great difficulty and his family (who was staying with him until he was rehospitalization yesterday) observed that he could barely breathe after showering, attempting to get up to get  a glass of water, or doing anything that required him to get up. [name] reports he is unable to get to his mailbox and patient states he could barely breathe after washing his hair. He states he does not feel safe performing ADLs at home alone and his family agrees. Patient/family strongly wish for patient to get stronger before being home alone. Upon PT evaluation, patient was able to complete supine <> sit mod I for increased time and complaint of feeling winded. He completed sit <> stand from/to ED stretcher and at standard height chair with one UE support and modified independence, complaining of quick fatigue and shortness of breath. Patient ambulated ~ 190 feet with no AD and supervision from PT but he fatigued quickly, reporting he felt he could not breathe. He completed 5 Time Sit To Stand (5TSTS) Test in 23 seconds from standard height chair with B UE support(norm for males ages 60-89: 16.7+-4.5 with no use of UE; cutoff for increased fall risk: 15 seconds with no use of UE) which shows increased fall risk and below norm for his age group (which suggests increased risk for hospitalization and falls). SpO2 remained 95-100% on room air and HR in high 70s to low 80s throughout session, but telemetry showed afib with intermittant PVCs throughout session.  Considering patient is at elevated risk for falls, is elevated risk for hospitalization, lives alone, and has already failed attempt to return home due to difficulty with function, PT recommends he complete short term rehab at SNF to regain functional strength and independence prior to returning home following acute care hospitalization.  PT also recommends 3in1 commode for use as a shower seat and rollator walker to conserve energy by providing places to sit when completing functional activities given his quick fatigue.      Recommendations for follow up therapy are one component of a multi-disciplinary discharge planning process, led by the attending  physician.  Recommendations may be updated based on patient status, additional functional criteria and insurance authorization.  Follow Up Recommendations Skilled nursing-short term rehab (<3 hours/day)    Assistance Recommended at Discharge PRN  Functional Status Assessment Patient has had a recent decline in their functional status and demonstrates the ability to make significant improvements in function in a reasonable and predictable amount of time.  Equipment Recommendations  Rollator (4 wheels);BSC/3in1    Recommendations for Other Services OT consult     Precautions / Restrictions Precautions Precautions: Fall      Mobility  Bed Mobility Overal bed mobility: Independent             General bed mobility comments: patient able to complete sit <> supine on flat surface without use of handrails. Reports feeling unable to breathe in supine.    Transfers Overall transfer level: Modified independent Equipment used: None Transfers: Sit to/from Stand Sit to Stand: Modified independent (Device/Increase time)           General transfer comment: Patient completed sit <> stand from/t ED stretcher and at standard height chair with one arm with modified independence. He complained of quick fatigue and shortness of breath. Completed 5 Time Sit To Stand Test from standard chair with UE support in 23 seconds and felt out of breath and winded. SpO2 remained 96% or above. Telemetry showed afib throughout session with intermittant PVCs.    Ambulation/Gait Ambulation/Gait assistance: Supervision Gait Distance (Feet): 190 Feet Assistive device: None Gait Pattern/deviations: Trunk flexed;Step-through pattern;Decreased stride length Gait velocity: decreased     General Gait Details: patient ambulated ~ 190 feet with no AD and supervision from PT. He assumed slightly stooped posture and fatigued quickly, reporting he felt he could not breathe. SpO2 dropped to 95% and quickly returned  to 99% with rest. HR remained in high 70s to low 80s but telemetry shows afib throughout session with intermittant PVCs.  Stairs            Wheelchair Mobility    Modified Rankin (Stroke Patients Only)       Balance Overall balance assessment: Needs assistance Sitting-balance support: Feet supported Sitting balance-Leahy Scale: Good Sitting balance - Comments: steady sitting edge of bed     Standing balance-Leahy Scale: Good Standing balance comment: patient is able to complete ambulation with no loss of balance without AD. supervision provided for safety                 Standardized Balance Assessment Standardized Balance Assessment :  (5 Time Sit To Stand (5TSTS) Test: 23 seconds from standard height chair with B UE support. (norm for males ages 91-89: 16.7+-4.5 with no use of UE; cutoff for increased fall risk: 15 seconds with no use of UE).)           Pertinent Vitals/Pain Pain Assessment: No/denies pain    Home Living Family/patient expects to be discharged to:: Private residence Living Arrangements: Alone Available Help at Discharge: Other (Comment) (no one is available, daughter lives 2 hours away) Type of Home: Other(Comment) (Duplex) Home Access: Stairs to enter Entrance Stairs-Rails: Right Entrance Stairs-Number of Steps: 4   Home Layout: One level Home  Equipment: None      Prior Function Prior Level of Function : Independent/Modified Independent;Driving (no history of falls in last 6 months)             Mobility Comments: prior to illness at end of November, patient was I with household and community mobility with no AD or excessive fatigue ADLs Comments: Prior to illness at end of November, patient was I with all ADLs and IADLs including driving.     Hand Dominance        Extremity/Trunk Assessment   Upper Extremity Assessment Upper Extremity Assessment: Overall WFL for tasks assessed    Lower Extremity Assessment Lower  Extremity Assessment: Generalized weakness    Cervical / Trunk Assessment Cervical / Trunk Assessment: Normal  Communication   Communication: No difficulties  Cognition Arousal/Alertness: Awake/alert Behavior During Therapy: WFL for tasks assessed/performed Overall Cognitive Status: Within Functional Limits for tasks assessed                                          General Comments General comments (skin integrity, edema, etc.): patient observed to have swelling and bruising of left hand and forearm. Telemetry showed afib with intermittant PVCs throughout session. Patient frequently complaining of difficulty breathing. SpO2 remained 95-100% on room air and HR in high 70s to low 80s throughout session.    Exercises Other Exercises Other Exercises: educated patient/family on role of PT in acute care setting, discharge and DME reccomendations.   Assessment/Plan    PT Assessment Patient needs continued PT services  PT Problem List Decreased strength;Decreased knowledge of use of DME;Decreased activity tolerance;Decreased balance;Decreased mobility;Cardiopulmonary status limiting activity       PT Treatment Interventions DME instruction;Gait training;Stair training;Functional mobility training;Therapeutic activities;Therapeutic exercise;Balance training;Neuromuscular re-education;Patient/family education    PT Goals (Current goals can be found in the Care Plan section)  Acute Rehab PT Goals Patient Stated Goal: for patient to go to rehab facility to get stronger prior to returning home so he can function safely at home PT Goal Formulation: With patient/family Time For Goal Achievement: 04/26/21 Potential to Achieve Goals: Fair    Frequency Min 2X/week   Barriers to discharge Decreased caregiver support Patient lives alone and is unable to safely complete his ADLs and neccessary IADLs without assistance.    Co-evaluation               AM-PAC PT "6  Clicks" Mobility  Outcome Measure Help needed turning from your back to your side while in a flat bed without using bedrails?: None Help needed moving from lying on your back to sitting on the side of a flat bed without using bedrails?: None Help needed moving to and from a bed to a chair (including a wheelchair)?: A Little Help needed standing up from a chair using your arms (e.g., wheelchair or bedside chair)?: A Little Help needed to walk in hospital room?: A Little Help needed climbing 3-5 steps with a railing? : A Lot 6 Click Score: 19    End of Session Equipment Utilized During Treatment: Gait belt Activity Tolerance: Patient limited by fatigue Patient left: in bed;with call bell/phone within reach Nurse Communication: Mobility status PT Visit Diagnosis: Difficulty in walking, not elsewhere classified (R26.2);Muscle weakness (generalized) (M62.81);Unsteadiness on feet (R26.81)    Time: 1250-1318 PT Time Calculation (min) (ACUTE ONLY): 28 min   Charges:   PT Evaluation $PT  Eval Low Complexity: 1 Low PT Treatments $Therapeutic Activity: 8-22 mins      Everlean Alstrom. Graylon Good, PT, DPT 04/12/21, 2:44 PM

## 2021-04-13 ENCOUNTER — Other Ambulatory Visit: Payer: Self-pay

## 2021-04-13 NOTE — ED Notes (Signed)
Patient resting quietly in bed with eyes closed.  Respirations even and unlabored.

## 2021-04-13 NOTE — ED Notes (Signed)
Patient resting quietly at this time.  Respirations even and unlabored.   

## 2021-04-13 NOTE — ED Provider Notes (Signed)
Today's Vitals   04/12/21 1930 04/12/21 2015 04/12/21 2100 04/12/21 2325  BP:    (!) 170/97  Pulse: (!) 45 (!) 51 82 76  Resp: 18 18 (!) 26 20  Temp:      TempSrc:      SpO2: 97% 98% 95% 95%  Weight:      Height:      PainSc:    0-No pain   Body mass index is 28.59 kg/m.   Patient has been resting comfortably.  No acute complaints.  Hemodynamically stable.  Awaiting social work evaluation for further disposition.   [name], Delice Bison, DO 04/13/21 [phone]

## 2021-04-13 NOTE — ED Notes (Signed)
This RN to bedside for assessment. Pt. Resting comfortably in bed. States he had an episode this AM where he felt light-headed after he went to the bathroom. Pt. States currently no complaints other than being a bit congested. This RN discussed possible inhaler use for breathing, pt. Agreed. NAD. Denies further need at this time.

## 2021-04-13 NOTE — ED Notes (Signed)
Patient sitting on edge of bed watching TV.  No complaints voiced

## 2021-04-13 NOTE — Evaluation (Signed)
Occupational Therapy Evaluation Patient Details Name: Peter [name] MRN: [id number] DOB: 1941-03-14 Today's Date: 04/13/2021   History of Present Illness Peter [name] is a 81 y.o. male with medical history significant for coronary artery disease status post PCI with stent angioplasty, hypertension, anemia of chronic disease, chronic diastolic dysfunction CH.  He presented to hospital ED on 12/31 for shortness of breath.  Recently admitted for CHF exacerbation and acute on chronic kidney injury.  Just discharged home and states he was not feeling better on discharge. Patient's VQ scan is negative for PE.  Medical work-up does not appear to show any acute reason for hospitalization however given the patient's weakness daughter does not feel the patient could be safely discharged home.   Clinical Impression   Peter [name] was seen for OT evaluation this date. Pt calls his daughter "Peter [name]" at start of session. Peter [name] is present via speaker phone and actively participates in OT session. Pt reports he was independent in all ADL/IADL management prior to his recent illness. He endorses being independent with functional mobility, driving, living alone in a 1 level duplex with 4 STE. Pt denies falls history. Pt reports becoming easily fatigued or out of breath with minimal exertion over the last two weeks. Pt currently requires MIN assist for exertional LB ADL management due to current functional impairments (See ADL section below for additional detail). Pt educated in energy conservation strategies including pursed lip breathing, activity pacing, home/routines modifications, safe use of AE/DME for ADL management, and falls prevention strategies. Pt/daughter return verbalized understanding and would benefit from additional skilled OT services to maximize recall and carryover of learned techniques and facilitate implementation of learned techniques into daily routines. Upon discharge, recommend STR to maximize pt  safety and return to PLOF.       Recommendations for follow up therapy are one component of a multi-disciplinary discharge planning process, led by the attending physician.  Recommendations may be updated based on patient status, additional functional criteria and insurance authorization.   Follow Up Recommendations  Skilled nursing-short term rehab (<3 hours/day)    Assistance Recommended at Discharge Intermittent Supervision/Assistance  Functional Status Assessment  Patient has had a recent decline in their functional status and demonstrates the ability to make significant improvements in function in a reasonable and predictable amount of time.  Equipment Recommendations  BSC/3in1    Recommendations for Other Services       Precautions / Restrictions Precautions Precautions: Fall Restrictions Weight Bearing Restrictions: No      Mobility Bed Mobility Overal bed mobility: Modified Independent             General bed mobility comments: Sup>sit with HOB elevated for gurney bed.    Transfers Overall transfer level: Modified independent Equipment used: None Transfers: Sit to/from Stand Sit to Stand: Supervision                  Balance Overall balance assessment: Needs assistance Sitting-balance support: Feet supported Sitting balance-Leahy Scale: Good Sitting balance - Comments: steady static sitting, reaching within BOS.     Standing balance-Leahy Scale: Good Standing balance comment: Steady static standing without UE support.                           ADL either performed or assessed with clinical judgement   ADL Overall ADL's : Needs assistance/impaired  General ADL Comments: Pt is functionally limited by generalized weakness, decreased activity tolerance, and cardiopulmonary status. He requires close supervision cues for impelmentation of energy conservation techniques during  functional mobility and to doff/don bilat shoes and socks this date. Anticipate increased assist for more exertional ADL Management, likely MIN A For LB bathing, dressing tasks. Set-up for UB ADL management     Vision Baseline Vision/History: 1 Wears glasses Patient Visual Report: No change from baseline       Perception     Praxis      Pertinent Vitals/Pain Pain Assessment: No/denies pain     Hand Dominance     Extremity/Trunk Assessment Upper Extremity Assessment Upper Extremity Assessment: Generalized weakness   Lower Extremity Assessment Lower Extremity Assessment: Generalized weakness   Cervical / Trunk Assessment Cervical / Trunk Assessment: Normal   Communication Communication Communication: No difficulties   Cognition Arousal/Alertness: Awake/alert Behavior During Therapy: WFL for tasks assessed/performed Overall Cognitive Status: Within Functional Limits for tasks assessed                                 General Comments: Pleasant, conversational, eager to participate in therapy session.     General Comments  Pt vitals monitored during session. He requires consistent therapeutic rest breaks and cues for pursed lip breathing during functional activity. SpO2 per room monitor noted to drop to 88% with initial functional t/f from EOB to room recliner and LB dressing task. Pt rebounds to 98% with rest breaks and cues for PLB.    Exercises Other Exercises Other Exercises: Pt/caregiver (daughter Peter [name] present via speaker phone t/o session) educated on role of OT in acute setting, safe use of AE/DME for ADL management, falls prevention strategies, considerations for DC via STR vs. Home with home health OT, and energy conservation techniques.   Shoulder Instructions      Home Living Family/patient expects to be discharged to:: Private residence Living Arrangements: Alone Available Help at Discharge: Other (Comment) (Limited help available. Dtr lives 2  hours away.) Type of Home:  (Duplex) Home Access: Stairs to enter [employer] of Steps: 4 Entrance Stairs-Rails: Right Home Layout: One level     Bathroom Shower/Tub: Occupational psychologist: Standard     Home Equipment: Radiation protection practitioner Equipment: Reacher;Long-handled shoe horn        Prior Functioning/Environment Prior Level of Function : Independent/Modified Independent;Driving             Mobility Comments: prior to illness at end of November, patient was I with household and community mobility with no AD or excessive fatigue. Denies falls history in the past 6 months. ADLs Comments: Prior to illness at end of November, patient was I with all ADLs and IADLs including driving.        OT Problem List: Decreased strength;Decreased coordination;Cardiopulmonary status limiting activity;Decreased activity tolerance;Decreased safety awareness;Impaired balance (sitting and/or standing);Decreased knowledge of use of DME or AE      OT Treatment/Interventions: Self-care/ADL training;Therapeutic exercise;Therapeutic activities;DME and/or AE instruction;Patient/family education;Balance training;Energy conservation    OT Goals(Current goals can be found in the care plan section) Acute Rehab OT Goals Patient Stated Goal: To get my strength back. OT Goal Formulation: With patient Time For Goal Achievement: 04/27/21 Potential to Achieve Goals: Good ADL Goals Pt Will Perform Upper Body Dressing: with modified independence;sitting;with adaptive equipment (c independent use of energy conservation techniques PRN.) Pt Will Perform Lower Body  Dressing: sit to/from stand;with modified independence (c independent use of energy conservation techniques PRN.) Pt Will Transfer to Toilet: ambulating;with modified independence (c independent use of energy conservation techniques PRN.) Pt Will Perform Toileting - Clothing Manipulation and hygiene: sit to/from  stand;with modified independence (c independent use of energy conservation techniques PRN.)  OT Frequency: Min 3X/week   Barriers to D/C:            Co-evaluation              AM-PAC OT "6 Clicks" Daily Activity     Outcome Measure Help from another person eating meals?: None Help from another person taking care of personal grooming?: A Little Help from another person toileting, which includes using toliet, bedpan, or urinal?: A Little Help from another person bathing (including washing, rinsing, drying)?: A Little Help from another person to put on and taking off regular upper body clothing?: A Little Help from another person to put on and taking off regular lower body clothing?: A Little 6 Click Score: 19   End of Session Nurse Communication: Mobility status  Activity Tolerance: Patient tolerated treatment well Patient left: in bed;with call bell/phone within reach (Seated EOB in gurney bed.)  OT Visit Diagnosis: Other abnormalities of gait and mobility (R26.89);Muscle weakness (generalized) (M62.81)                Time: 9735-3299 OT Time Calculation (min): 39 min Charges:  OT General Charges $OT Visit: 1 Visit OT Evaluation $OT Eval Moderate Complexity: 1 Mod OT Treatments $Self Care/Home Management : 23-37 mins  Shara Blazing, M.S., OTR/L [facility] Team - [facility] Ascom: [phone] 04/13/21, 1:53 PM

## 2021-04-13 NOTE — ED Provider Notes (Signed)
Today nurse noted patient having occasional ventricular ectopy on monitor.  Reviewed monitoring strip which nurse provides I do not see evidence of acute ventricular arrhythmia such as ventricular tachycardia.  I do however see multiple PVCs off and on at times occasionally paired.  Review of his old EKGs including yesterday's EKG it appears that PVCs are not new nor is the paired appearance.  Compared with previous EKG no evidence of acute ischemia again seen and occasional PVCs.  Vitals:   04/13/21 2230 04/13/21 2245  BP:    Pulse:  63  Resp: 13 15  Temp:    SpO2: 96% 98%   Patient normotensive asymptomatic   Delman Kitten, MD 04/13/21 2338

## 2021-04-14 MED ORDER — DIPHENHYDRAMINE HCL 25 MG PO CAPS
25.0000 mg | ORAL_CAPSULE | Freq: Four times a day (QID) | ORAL | Status: DC | PRN
  Administered 2021-04-14: 25 mg via ORAL
  Filled 2021-04-14: qty 1

## 2021-04-14 NOTE — ED Notes (Addendum)
Pt c/o SOB- raised head of bed and also added nasal cannula at 2L/min due to O2 sats hovering at 95%, increased RR in mid 20's and and for patient comfort/by patient request. Pt declines breathing tx/inhaler at this time. No chest pain or palpitations reported and per cardiac monitor patient having frequent pairs of PVC's. Pt reported "is working hard to breathe."  Also verified with patient no known food allergies or other ideas for previous possible reaction to dinner ingested earlier in hospital causing some mild swelling around eyes and increased SOB and led to Benadryl being given.

## 2021-04-14 NOTE — ED Notes (Signed)
Family at the bedsidea

## 2021-04-14 NOTE — TOC Progression Note (Signed)
Transition of Care [facility]) - Progression Note    Patient Details  Name: Peter [name] MRN: [id number] Date of Birth: [birth year]  Transition of Care [facility]) CM/SW Contact  Shelbie Hutching, RN Phone Number: 04/14/2021, 2:16 PM  Clinical Narrative:    RNCM spoke with daughter, [name], via phone.  [facility] does not have any beds available this week.  [facility] and [facility] can offer a bed.  [name] wants to research the facilities and will make a decision this afternoon.     Expected Discharge Plan: Skilled Nursing Facility Barriers to Discharge: SNF Pending bed offer  Expected Discharge Plan and Services Expected Discharge Plan: [place] arrangements for the past 2 months: Single Family Home                                       Social Determinants of Health (SDOH) Interventions    Readmission Risk Interventions No flowsheet data found.

## 2021-04-14 NOTE — TOC Progression Note (Signed)
Transition of Care [facility]) - Progression Note    Patient Details  Name: CHADRIC KIMBERLEY MRN: [id number] Date of Birth: 12/22/1940  Transition of Care [facility]) CM/SW Contact  Shelbie Hutching, RN Phone Number: 04/14/2021, 3:58 PM  Clinical Narrative:    Daughter chooses Radiation protection practitioner for rehab.  RNCM will get [employer] to start insurance authorization.     Expected Discharge Plan: Skilled Nursing Facility Barriers to Discharge: SNF Pending bed offer  Expected Discharge Plan and Services Expected Discharge Plan: [place] arrangements for the past 2 months: Single Family Home                                       Social Determinants of Health (SDOH) Interventions    Readmission Risk Interventions No flowsheet data found.

## 2021-04-14 NOTE — ED Notes (Signed)
Pt needed to urinate- able to ambulate on own. Unplugged from monitor and assisted pt to feet. Pt steady on feet and able to ambulate back and forth to in room toilet. Pt had previously taken nasal cannula off for a few minutes due to improved breathing/less SOB. After pt ambulated to bathroom, RR increased and pt reported some SOB. Nasal cannula replaced on pt at 2L/min.

## 2021-04-15 LAB — RESP PANEL BY RT-PCR (FLU A&B, COVID) ARPGX2
Influenza A by PCR: NEGATIVE
Influenza B by PCR: NEGATIVE
SARS Coronavirus 2 by RT PCR: NEGATIVE

## 2021-04-15 NOTE — TOC Progression Note (Signed)
Transition of Care [facility]) - Progression Note    Patient Details  Name: Peter [name] MRN: [id number] Date of Birth: 1940-09-19  Transition of Care [name]) CM/SW Contact  Shelbie Hutching, RN Phone Number: 04/15/2021, 3:02 PM  Clinical Narrative:    Insurance authorization pending with [employer].  RNCM updated daughter, [name].     Expected Discharge Plan: Skilled Nursing Facility Barriers to Discharge: SNF Pending bed offer  Expected Discharge Plan and Services Expected Discharge Plan: [place] arrangements for the past 2 months: Single Family Home                                       Social Determinants of Health (SDOH) Interventions    Readmission Risk Interventions No flowsheet data found.

## 2021-04-15 NOTE — ED Notes (Signed)
Daughter requesting to speak to MD regarding wanting pt to see pulmonologist. Dr Charna Archer aware [name] [phone]

## 2021-04-15 NOTE — ED Notes (Signed)
Pt reading in bed. Provided coke as requested

## 2021-04-15 NOTE — Progress Notes (Signed)
Physical Therapy Treatment Patient Details Name: Peter [name] MRN: [id number] DOB: 03-Nov-1940 Today's Date: 04/15/2021   History of Present Illness Peter [name] is a 81 y.o. male with medical history significant for coronary artery disease status post PCI with stent angioplasty, hypertension, anemia of chronic disease, chronic diastolic dysfunction CH.  He presented to hospital ED on 12/31 for shortness of breath.  Recently admitted for CHF exacerbation and acute on chronic kidney injury.  Just discharged home and states he was not feeling better on discharge. Patient's VQ scan is negative for PE.  Medical work-up does not appear to show any acute reason for hospitalization however given the patient's weakness daughter does not feel the patient could be safely discharged home.    PT Comments    Pt is making gradual progress towards goals and continues to be limited by SOB symptoms. Able to ambulate in hallway, however needs seated rest break. Able to perform 5 time sit<>Stand in 26 seconds demonstrating decreased power in B LE. Not safe to dc home alone at this time due to decreased endurance and energy conversation. Will continue to progress.   Recommendations for follow up therapy are one component of a multi-disciplinary discharge planning process, led by the attending physician.  Recommendations may be updated based on patient status, additional functional criteria and insurance authorization.  Follow Up Recommendations  Skilled nursing-short term rehab (<3 hours/day)     Assistance Recommended at Discharge PRN  Patient can return home with the following     Equipment Recommendations  Rollator (4 wheels);BSC/3in1    Recommendations for Other Services OT consult     Precautions / Restrictions Precautions Precautions: Fall Restrictions Weight Bearing Restrictions: No     Mobility  Bed Mobility Overal bed mobility: Modified Independent             General bed  mobility comments: safe technique    Transfers Overall transfer level: Modified independent Equipment used: None Transfers: Sit to/from Stand Sit to Stand: Modified independent (Device/Increase time)           General transfer comment: safe technique with upright posture    Ambulation/Gait Ambulation/Gait assistance: Supervision Gait Distance (Feet): 125 Feet Assistive device: None Gait Pattern/deviations: Trunk flexed;Step-through pattern;Decreased stride length       General Gait Details: very slow gait speed with stooped posture. O2 sats remain stable however pt very symptomatic with SOB symptoms needing seated rest break. No AD required   Stairs             Wheelchair Mobility    Modified Rankin (Stroke Patients Only)       Balance Overall balance assessment: Needs assistance Sitting-balance support: Feet supported Sitting balance-Leahy Scale: Good Sitting balance - Comments: steady static sitting, reaching within BOS.     Standing balance-Leahy Scale: Good Standing balance comment: Steady static standing without UE support.                            Cognition Arousal/Alertness: Awake/alert Behavior During Therapy: WFL for tasks assessed/performed Overall Cognitive Status: Within Functional Limits for tasks assessed                                          Exercises Other Exercises Other Exercises: supine ther-ex performed including LAQ, alt marching, and SLR. 10 reps performed  General Comments        Pertinent Vitals/Pain Pain Assessment: No/denies pain    Home Living                          Prior Function            PT Goals (current goals can now be found in the care plan section) Acute Rehab PT Goals Patient Stated Goal: for patient to go to rehab facility to get stronger prior to returning home so he can function safely at home PT Goal Formulation: With patient/family Time For Goal  Achievement: 04/26/21 Potential to Achieve Goals: Fair Progress towards PT goals: Progressing toward goals    Frequency    Min 2X/week      PT Plan Current plan remains appropriate    Co-evaluation              AM-PAC PT "6 Clicks" Mobility   Outcome Measure  Help needed turning from your back to your side while in a flat bed without using bedrails?: None Help needed moving from lying on your back to sitting on the side of a flat bed without using bedrails?: None Help needed moving to and from a bed to a chair (including a wheelchair)?: A Little Help needed standing up from a chair using your arms (e.g., wheelchair or bedside chair)?: A Little Help needed to walk in hospital room?: A Little Help needed climbing 3-5 steps with a railing? : A Lot 6 Click Score: 19    End of Session Equipment Utilized During Treatment: Gait belt Activity Tolerance: Patient limited by fatigue Patient left: in bed (seated at EOB) Nurse Communication: Mobility status PT Visit Diagnosis: Difficulty in walking, not elsewhere classified (R26.2);Muscle weakness (generalized) (M62.81);Unsteadiness on feet (R26.81)     Time: 0865-7846 PT Time Calculation (min) (ACUTE ONLY): 17 min  Charges:  $Gait Training: 8-22 mins                     Greggory Stallion, [place], DPT [phone]    Lititia Sen 04/15/2021, 4:54 PM

## 2021-04-15 NOTE — ED Notes (Signed)
Dietary contacted for breakfast tray 

## 2021-04-15 NOTE — TOC Progression Note (Signed)
Transition of Care [facility]) - Progression Note    Patient Details  Name: Peter [name] MRN: [id number] Date of Birth: 12/15/1940  [facility]) CM/SW Contact  Shelbie Hutching, RN Phone Number: 04/15/2021, 1:17 PM  Clinical Narrative:    Insurance authorization is pending.     Expected Discharge Plan: Skilled Nursing Facility Barriers to Discharge: SNF Pending bed offer  Expected Discharge Plan and Services Expected Discharge Plan: [place] arrangements for the past 2 months: Single Family Home                                       Social Determinants of Health (SDOH) Interventions    Readmission Risk Interventions No flowsheet data found.

## 2021-04-16 DIAGNOSIS — K219 Gastro-esophageal reflux disease without esophagitis: Secondary | ICD-10-CM | POA: Diagnosis not present

## 2021-04-16 DIAGNOSIS — E7800 Pure hypercholesterolemia, unspecified: Secondary | ICD-10-CM | POA: Diagnosis not present

## 2021-04-16 DIAGNOSIS — Z9981 Dependence on supplemental oxygen: Secondary | ICD-10-CM | POA: Diagnosis not present

## 2021-04-16 DIAGNOSIS — R001 Bradycardia, unspecified: Secondary | ICD-10-CM | POA: Diagnosis not present

## 2021-04-16 DIAGNOSIS — Z7401 Bed confinement status: Secondary | ICD-10-CM | POA: Diagnosis not present

## 2021-04-16 DIAGNOSIS — I129 Hypertensive chronic kidney disease with stage 1 through stage 4 chronic kidney disease, or unspecified chronic kidney disease: Secondary | ICD-10-CM | POA: Diagnosis not present

## 2021-04-16 DIAGNOSIS — Z20822 Contact with and (suspected) exposure to covid-19: Secondary | ICD-10-CM | POA: Diagnosis not present

## 2021-04-16 DIAGNOSIS — D631 Anemia in chronic kidney disease: Secondary | ICD-10-CM | POA: Diagnosis not present

## 2021-04-16 DIAGNOSIS — I110 Hypertensive heart disease with heart failure: Secondary | ICD-10-CM | POA: Diagnosis not present

## 2021-04-16 DIAGNOSIS — R531 Weakness: Secondary | ICD-10-CM | POA: Diagnosis not present

## 2021-04-16 DIAGNOSIS — E538 Deficiency of other specified B group vitamins: Secondary | ICD-10-CM | POA: Diagnosis not present

## 2021-04-16 DIAGNOSIS — I130 Hypertensive heart and chronic kidney disease with heart failure and stage 1 through stage 4 chronic kidney disease, or unspecified chronic kidney disease: Secondary | ICD-10-CM | POA: Diagnosis not present

## 2021-04-16 DIAGNOSIS — I509 Heart failure, unspecified: Secondary | ICD-10-CM | POA: Diagnosis not present

## 2021-04-16 DIAGNOSIS — R809 Proteinuria, unspecified: Secondary | ICD-10-CM | POA: Diagnosis not present

## 2021-04-16 DIAGNOSIS — D696 Thrombocytopenia, unspecified: Secondary | ICD-10-CM | POA: Diagnosis not present

## 2021-04-16 DIAGNOSIS — E1151 Type 2 diabetes mellitus with diabetic peripheral angiopathy without gangrene: Secondary | ICD-10-CM | POA: Diagnosis not present

## 2021-04-16 DIAGNOSIS — D649 Anemia, unspecified: Secondary | ICD-10-CM | POA: Diagnosis not present

## 2021-04-16 DIAGNOSIS — I5022 Chronic systolic (congestive) heart failure: Secondary | ICD-10-CM | POA: Diagnosis not present

## 2021-04-16 DIAGNOSIS — I10 Essential (primary) hypertension: Secondary | ICD-10-CM | POA: Diagnosis not present

## 2021-04-16 DIAGNOSIS — N184 Chronic kidney disease, stage 4 (severe): Secondary | ICD-10-CM | POA: Diagnosis not present

## 2021-04-16 DIAGNOSIS — G4700 Insomnia, unspecified: Secondary | ICD-10-CM | POA: Diagnosis not present

## 2021-04-16 DIAGNOSIS — G20 Parkinson's disease: Secondary | ICD-10-CM | POA: Diagnosis not present

## 2021-04-16 DIAGNOSIS — N401 Enlarged prostate with lower urinary tract symptoms: Secondary | ICD-10-CM | POA: Diagnosis not present

## 2021-04-16 DIAGNOSIS — I714 Abdominal aortic aneurysm, without rupture, unspecified: Secondary | ICD-10-CM | POA: Diagnosis not present

## 2021-04-16 DIAGNOSIS — J449 Chronic obstructive pulmonary disease, unspecified: Secondary | ICD-10-CM | POA: Diagnosis not present

## 2021-04-16 DIAGNOSIS — G40909 Epilepsy, unspecified, not intractable, without status epilepticus: Secondary | ICD-10-CM | POA: Diagnosis not present

## 2021-04-16 DIAGNOSIS — I2510 Atherosclerotic heart disease of native coronary artery without angina pectoris: Secondary | ICD-10-CM | POA: Diagnosis not present

## 2021-04-16 DIAGNOSIS — I9789 Other postprocedural complications and disorders of the circulatory system, not elsewhere classified: Secondary | ICD-10-CM | POA: Diagnosis not present

## 2021-04-16 DIAGNOSIS — Z4502 Encounter for adjustment and management of automatic implantable cardiac defibrillator: Secondary | ICD-10-CM | POA: Diagnosis not present

## 2021-04-16 DIAGNOSIS — N179 Acute kidney failure, unspecified: Secondary | ICD-10-CM | POA: Diagnosis not present

## 2021-04-16 DIAGNOSIS — I5032 Chronic diastolic (congestive) heart failure: Secondary | ICD-10-CM | POA: Diagnosis not present

## 2021-04-16 DIAGNOSIS — R092 Respiratory arrest: Secondary | ICD-10-CM | POA: Diagnosis not present

## 2021-04-16 DIAGNOSIS — R0989 Other specified symptoms and signs involving the circulatory and respiratory systems: Secondary | ICD-10-CM | POA: Diagnosis not present

## 2021-04-16 DIAGNOSIS — J309 Allergic rhinitis, unspecified: Secondary | ICD-10-CM | POA: Diagnosis not present

## 2021-04-16 NOTE — ED Notes (Signed)
[facility] here to transport to pt [employer] able to walk to vehicle and get in without assistant

## 2021-04-16 NOTE — ED Notes (Signed)
Pt states he woke up and after taking sip of drink and bite of crackers he started to feel a little SOB, placed himself on 2L [place] and oxygen 98% but not feeling much relief so pt given albuterol puffs.  Pt chest rise even and unlabored, in NAD at this time.

## 2021-04-16 NOTE — ED Notes (Signed)
Pt states that he had a normal BM last night

## 2021-04-16 NOTE — ED Notes (Signed)
Pt sitting comfortably on side of bed. Complaining of shortness of breath, given PRN inhaler   Denies further needs a this time

## 2021-04-16 NOTE — ED Notes (Signed)
Talked to safe transport  Christian set up transport to [facility]

## 2021-04-16 NOTE — ED Notes (Signed)
Called safe transport for transport to [facility],  had to leave message  [phone]

## 2021-04-16 NOTE — TOC Transition Note (Signed)
Transition of Care [facility]) - CM/SW Discharge Note   Patient Details  Name: Peter [name] MRN: [id number] Date of Birth: 01-25-1941  [facility]) CM/SW Contact:  Shelbie Hutching, RN Phone Number: 04/16/2021, 11:22 AM   Clinical Narrative:    Insurance authorization approved for [facility], Information systems manager # [id number].  Insurance would not authorize for EMS transport.  Daughter Peter [name] updated that patient can go to [facility] today and will be going to room 502.  Patient's brother or significant other will be coming to transport him.  Bedside RN will call report to [phone].     Final next level of care: Skilled Nursing Facility Barriers to Discharge: Barriers Resolved   Patient Goals and CMS Choice Patient states their goals for this hospitalization and ongoing recovery are:: Get strong enough in rehab to return home      Discharge Placement   Existing PASRR number confirmed : 04/12/21          Patient chooses bed at: [facility] Patient to be transferred to facility by: Family will transport Name of family member notified: Daughter Peter [name] Patient and family notified of of transfer: 04/16/21  Discharge Plan and Services                DME Arranged: N/A DME Agency: NA       HH Arranged: NA          Social Determinants of Health (SDOH) Interventions     Readmission Risk Interventions No flowsheet data found.

## 2021-04-16 NOTE — ED Provider Notes (Signed)
Today's Vitals   04/15/21 2340 04/16/21 0100 04/16/21 0400 04/16/21 0407  BP:    (!) 190/70  Pulse:    60  Resp:    14  Temp:      TempSrc:      SpO2:    98%  Weight:      Height:      PainSc: Asleep Asleep 0-No pain    Body mass index is 28.59 kg/m.   Patient resting comfortably.  No acute events overnight.  Awaiting social work disposition.   Peter [name], Delice Bison, DO 04/16/21 [phone]

## 2021-04-17 DIAGNOSIS — G40909 Epilepsy, unspecified, not intractable, without status epilepticus: Secondary | ICD-10-CM | POA: Diagnosis not present

## 2021-04-17 DIAGNOSIS — N184 Chronic kidney disease, stage 4 (severe): Secondary | ICD-10-CM | POA: Diagnosis not present

## 2021-04-17 DIAGNOSIS — G20 Parkinson's disease: Secondary | ICD-10-CM | POA: Diagnosis not present

## 2021-04-17 DIAGNOSIS — I5022 Chronic systolic (congestive) heart failure: Secondary | ICD-10-CM | POA: Diagnosis not present

## 2021-04-17 DIAGNOSIS — E1151 Type 2 diabetes mellitus with diabetic peripheral angiopathy without gangrene: Secondary | ICD-10-CM | POA: Diagnosis not present

## 2021-04-21 ENCOUNTER — Other Ambulatory Visit: Payer: Self-pay

## 2021-04-21 DIAGNOSIS — R0989 Other specified symptoms and signs involving the circulatory and respiratory systems: Secondary | ICD-10-CM | POA: Diagnosis not present

## 2021-04-21 DIAGNOSIS — Z20822 Contact with and (suspected) exposure to covid-19: Secondary | ICD-10-CM | POA: Diagnosis not present

## 2021-04-21 DIAGNOSIS — I10 Essential (primary) hypertension: Secondary | ICD-10-CM | POA: Diagnosis not present

## 2021-04-21 DIAGNOSIS — R531 Weakness: Secondary | ICD-10-CM | POA: Diagnosis not present

## 2021-04-21 DIAGNOSIS — D631 Anemia in chronic kidney disease: Secondary | ICD-10-CM | POA: Diagnosis not present

## 2021-04-21 DIAGNOSIS — N179 Acute kidney failure, unspecified: Secondary | ICD-10-CM | POA: Diagnosis not present

## 2021-04-21 DIAGNOSIS — I509 Heart failure, unspecified: Secondary | ICD-10-CM | POA: Diagnosis not present

## 2021-04-21 DIAGNOSIS — R809 Proteinuria, unspecified: Secondary | ICD-10-CM | POA: Diagnosis not present

## 2021-04-21 DIAGNOSIS — I129 Hypertensive chronic kidney disease with stage 1 through stage 4 chronic kidney disease, or unspecified chronic kidney disease: Secondary | ICD-10-CM | POA: Diagnosis not present

## 2021-04-21 DIAGNOSIS — I110 Hypertensive heart disease with heart failure: Secondary | ICD-10-CM | POA: Diagnosis not present

## 2021-04-21 DIAGNOSIS — R001 Bradycardia, unspecified: Secondary | ICD-10-CM | POA: Diagnosis not present

## 2021-04-21 DIAGNOSIS — N184 Chronic kidney disease, stage 4 (severe): Secondary | ICD-10-CM | POA: Diagnosis not present

## 2021-04-21 LAB — CBC
HCT: 33.4 % — ABNORMAL LOW (ref 39.0–52.0)
Hemoglobin: 10.6 g/dL — ABNORMAL LOW (ref 13.0–17.0)
MCH: 29.2 pg (ref 26.0–34.0)
MCHC: 31.7 g/dL (ref 30.0–36.0)
MCV: 92 fL (ref 80.0–100.0)
Platelets: 161 10*3/uL (ref 150–400)
RBC: 3.63 MIL/uL — ABNORMAL LOW (ref 4.22–5.81)
RDW: 13.4 % (ref 11.5–15.5)
WBC: 6.1 10*3/uL (ref 4.0–10.5)
nRBC: 0 % (ref 0.0–0.2)

## 2021-04-21 LAB — BASIC METABOLIC PANEL
Anion gap: 6 (ref 5–15)
BUN: 47 mg/dL — ABNORMAL HIGH (ref 8–23)
CO2: 26 mmol/L (ref 22–32)
Calcium: 8.3 mg/dL — ABNORMAL LOW (ref 8.9–10.3)
Chloride: 107 mmol/L (ref 98–111)
Creatinine, Ser: 3.26 mg/dL — ABNORMAL HIGH (ref 0.61–1.24)
GFR, Estimated: 18 mL/min — ABNORMAL LOW (ref >60)
Glucose, Bld: 120 mg/dL — ABNORMAL HIGH (ref 70–99)
Potassium: 4.1 mmol/L (ref 3.5–5.1)
Sodium: 139 mmol/L (ref 135–145)

## 2021-04-21 LAB — TROPONIN I (HIGH SENSITIVITY): Troponin I (High Sensitivity): 191 ng/L (ref <18)

## 2021-04-21 NOTE — ED Triage Notes (Signed)
EMS brings pt in from [facility] for weakness; hx bradycardia

## 2021-04-21 NOTE — ED Triage Notes (Signed)
Pt presents via EMS from [place] due to bradycardia and weakness. EMS reports HR in 40s upon arrival. Per EMS, pt has a PMH of bradycardia. Pt reports has been having bradycardia since Thanksgiving. Reports seen here x2 in last week. Pt reports weakness.

## 2021-04-22 ENCOUNTER — Emergency Department
Admission: EM | Admit: 2021-04-22 | Discharge: 2021-04-22 | Disposition: A | Discharge status: Home / Self Care | Payer: PPO | Attending: Emergency Medicine | Admitting: Emergency Medicine

## 2021-04-22 DIAGNOSIS — I10 Essential (primary) hypertension: Secondary | ICD-10-CM | POA: Diagnosis not present

## 2021-04-22 DIAGNOSIS — R531 Weakness: Secondary | ICD-10-CM

## 2021-04-22 DIAGNOSIS — Z7401 Bed confinement status: Secondary | ICD-10-CM | POA: Diagnosis not present

## 2021-04-22 LAB — URINALYSIS, ROUTINE W REFLEX MICROSCOPIC
Bacteria, UA: NONE SEEN
Bilirubin Urine: NEGATIVE
Glucose, UA: 50 mg/dL — AB
Hgb urine dipstick: NEGATIVE
Ketones, ur: NEGATIVE mg/dL
Leukocytes,Ua: NEGATIVE
Nitrite: NEGATIVE
Protein, ur: >=300 mg/dL — AB
Specific Gravity, Urine: 1.012 (ref 1.005–1.030)
pH: 5 (ref 5.0–8.0)

## 2021-04-22 LAB — RESP PANEL BY RT-PCR (FLU A&B, COVID) ARPGX2
Influenza A by PCR: NEGATIVE
Influenza B by PCR: NEGATIVE
SARS Coronavirus 2 by RT PCR: NEGATIVE

## 2021-04-22 LAB — TROPONIN I (HIGH SENSITIVITY): Troponin I (High Sensitivity): 194 ng/L (ref <18)

## 2021-04-22 NOTE — ED Notes (Signed)
MD at the bedside  

## 2021-04-22 NOTE — ED Notes (Signed)
Patient still awaiting transport, NAD noted

## 2021-04-22 NOTE — ED Provider Notes (Addendum)
[facility] Provider Note    Event Date/Time   First MD Initiated Contact with Patient 04/22/21 0124     (approximate)  History   Chief Complaint: Weakness  HPI  Peter [name] is a 81 y.o. male with a past medical history of angina, CHF, hypertension, hyperlipidemia, presents to the emergency department with concerns of low heart rate and weakness.  According to the patient he is currently at [facility] rehab center as he was experiencing weakness and CHF exacerbation with increased fluid retention following infection with influenza.  Patient states today while they were checking his pulse they noted it to be around 30 to 40 bpm so they sent him to the emergency department.  Patient states he has been feeling somewhat weak but states that is fairly typical for him.  Denies any shortness of breath or chest pain.  Denies any fever or cough.  Here the patient was initially noted to be experiencing some PVCs on the cardiac monitor.  When I was evaluating the patient he had several PVCs and a bi and trigeminy rhythm.  Pulse ox was reading 35 bpm but actual beats per minute were double at 70 bpm on the cardiac monitor.  I suspect this is what was happening at the nursing facility as well.  Physical Exam   Triage Vital Signs: ED Triage Vitals  Enc Vitals Group     BP 04/21/21 2216 (!) 196/80     Pulse Rate 04/21/21 2216 65     Resp 04/21/21 2216 16     Temp 04/21/21 2216 98.6 F (37 C)     Temp Source 04/21/21 2216 Oral     SpO2 04/21/21 2211 90 %     Weight --      Height --      Head Circumference --      Peak Flow --      Pain Score 04/21/21 2219 0     Pain Loc --      Pain Edu? --      Excl. in [place]? --     Most recent vital signs: Vitals:   04/21/21 2216 04/22/21 0130  BP: (!) 196/80 (!) 194/85  Pulse: 65 64  Resp: 16 20  Temp: 98.6 F (37 C)   SpO2: 96% 97%    General: Awake, no distress.  CV:  Good peripheral perfusion.  Regular rate  and rhythm around 70 bpm.  Occasional PVC. Resp:  Normal effort.  Equal breath sounds bilaterally.  Abd:  No distention.  Soft, nontender.  No rebound or guarding. Other:     ED Results / Procedures / Treatments   EKG  EG viewed and interpreted by myself shows what appears to be irregular rhythm at 67 bpm with a slightly widened QRS, left axis deviation, no concerning ST changes.  Occasional PVCs.  MEDICATIONS ORDERED IN ED: Medications - No data to display   IMPRESSION / MDM / [place] / ED COURSE  I reviewed the triage vital signs and the nursing notes.  Patient presents emergency department for bradycardia noted at his rehab facility.  I reviewed the patient's discharge summary from 04/09/2021.  Patient was admitted at that time with shortness of breath, anemia down to 5.7, CHF exacerbation with fluid overload and worsening kidney disease as well as demand ischemia with elevated troponins.  Today the patient was sent to the emergency department for bradycardia.  Initially when the patient was seen by myself his  pulse oximetry reading was reading half of what his actual cardiac monitor was reading for instance it was reading 35 bpm when his actual cardiac monitor was 70 bpm.  On palpation patient's pulse largely matches the cardiac monitor around 70 bpm.  Patient's lab work shows an elevated troponin of 191 however compared to his prior lab work from 10 days ago this is decreased.  Patient's kidney function shows a GFR of 18 which is similar to the patient's GFR while he was in the hospital.  I spoke to the patient's daughter and updated on the patient's condition and findings so far.  Remainder the patient's work-up is largely nonrevealing.  COVID/flu is negative.  Troponin is largely unchanged after several hours.  Urinalysis has resulted normal.  Continues to have what appears to be a normal sinus rhythm around 60 to 70 bpm.  I believe patient is safe for discharge back to his  nursing care facility.  Patient agreeable to plan of care.  FINAL CLINICAL IMPRESSION(S) / ED DIAGNOSES   Weakness   Note:  This document was prepared using Dragon voice recognition software and may include unintentional dictation errors.   Harvest Dark, MD 04/22/21 4920    Harvest Dark, MD 04/22/21 [phone]

## 2021-04-22 NOTE — ED Notes (Signed)
Pt taken at this time to [facility].

## 2021-04-22 NOTE — ED Notes (Signed)
Called [facility] for transport back to [facility]

## 2021-04-22 NOTE — Discharge Instructions (Signed)
You have been seen in the emergency department today for an evaluation.  Your work-up shows normal results.  When checking a pulse with a pulse oximeter please confirm pulse rate with palpation (checking your pulse on your wrist).  Return to the emergency department for any symptom personally concerning to yourself.

## 2021-04-22 NOTE — ED Notes (Signed)
Updated patient on status of being transported back to [facility], verbalized understanding.

## 2021-04-22 NOTE — ED Notes (Signed)
Attempted to call report to [facility] twice. Unable to get anyone on the phone. Will try again later. Patient to be moved to hall bed 1.

## 2021-04-25 DIAGNOSIS — I5022 Chronic systolic (congestive) heart failure: Secondary | ICD-10-CM | POA: Diagnosis not present

## 2021-04-25 DIAGNOSIS — N184 Chronic kidney disease, stage 4 (severe): Secondary | ICD-10-CM | POA: Diagnosis not present

## 2021-04-25 DIAGNOSIS — J449 Chronic obstructive pulmonary disease, unspecified: Secondary | ICD-10-CM | POA: Diagnosis not present

## 2021-04-27 ENCOUNTER — Emergency Department: Payer: PPO

## 2021-04-27 ENCOUNTER — Inpatient Hospital Stay
Admission: EM | Admit: 2021-04-27 | Discharge: 2021-05-02 | DRG: 291 | Disposition: A | Discharge status: Home / Self Care | Payer: PPO | Attending: Student | Admitting: Student

## 2021-04-27 ENCOUNTER — Other Ambulatory Visit: Payer: Self-pay

## 2021-04-27 DIAGNOSIS — E559 Vitamin D deficiency, unspecified: Secondary | ICD-10-CM | POA: Diagnosis not present

## 2021-04-27 DIAGNOSIS — I160 Hypertensive urgency: Secondary | ICD-10-CM | POA: Diagnosis not present

## 2021-04-27 DIAGNOSIS — I493 Ventricular premature depolarization: Secondary | ICD-10-CM | POA: Diagnosis present

## 2021-04-27 DIAGNOSIS — Z79899 Other long term (current) drug therapy: Secondary | ICD-10-CM

## 2021-04-27 DIAGNOSIS — J439 Emphysema, unspecified: Secondary | ICD-10-CM | POA: Diagnosis present

## 2021-04-27 DIAGNOSIS — H3530 Unspecified macular degeneration: Secondary | ICD-10-CM | POA: Diagnosis present

## 2021-04-27 DIAGNOSIS — N179 Acute kidney failure, unspecified: Secondary | ICD-10-CM | POA: Diagnosis present

## 2021-04-27 DIAGNOSIS — Z95810 Presence of automatic (implantable) cardiac defibrillator: Secondary | ICD-10-CM | POA: Diagnosis not present

## 2021-04-27 DIAGNOSIS — I5043 Acute on chronic combined systolic (congestive) and diastolic (congestive) heart failure: Secondary | ICD-10-CM | POA: Diagnosis present

## 2021-04-27 DIAGNOSIS — Z7902 Long term (current) use of antithrombotics/antiplatelets: Secondary | ICD-10-CM

## 2021-04-27 DIAGNOSIS — F064 Anxiety disorder due to known physiological condition: Secondary | ICD-10-CM | POA: Diagnosis present

## 2021-04-27 DIAGNOSIS — I5033 Acute on chronic diastolic (congestive) heart failure: Secondary | ICD-10-CM

## 2021-04-27 DIAGNOSIS — I2510 Atherosclerotic heart disease of native coronary artery without angina pectoris: Secondary | ICD-10-CM | POA: Diagnosis present

## 2021-04-27 DIAGNOSIS — E7800 Pure hypercholesterolemia, unspecified: Secondary | ICD-10-CM | POA: Diagnosis present

## 2021-04-27 DIAGNOSIS — I130 Hypertensive heart and chronic kidney disease with heart failure and stage 1 through stage 4 chronic kidney disease, or unspecified chronic kidney disease: Principal | ICD-10-CM | POA: Diagnosis present

## 2021-04-27 DIAGNOSIS — J9621 Acute and chronic respiratory failure with hypoxia: Secondary | ICD-10-CM | POA: Diagnosis present

## 2021-04-27 DIAGNOSIS — R778 Other specified abnormalities of plasma proteins: Secondary | ICD-10-CM | POA: Diagnosis not present

## 2021-04-27 DIAGNOSIS — Z955 Presence of coronary angioplasty implant and graft: Secondary | ICD-10-CM | POA: Diagnosis not present

## 2021-04-27 DIAGNOSIS — I252 Old myocardial infarction: Secondary | ICD-10-CM

## 2021-04-27 DIAGNOSIS — J471 Bronchiectasis with (acute) exacerbation: Secondary | ICD-10-CM | POA: Diagnosis not present

## 2021-04-27 DIAGNOSIS — R7989 Other specified abnormal findings of blood chemistry: Secondary | ICD-10-CM | POA: Diagnosis not present

## 2021-04-27 DIAGNOSIS — I255 Ischemic cardiomyopathy: Secondary | ICD-10-CM | POA: Diagnosis not present

## 2021-04-27 DIAGNOSIS — Z20822 Contact with and (suspected) exposure to covid-19: Secondary | ICD-10-CM | POA: Diagnosis not present

## 2021-04-27 DIAGNOSIS — I5032 Chronic diastolic (congestive) heart failure: Secondary | ICD-10-CM | POA: Diagnosis not present

## 2021-04-27 DIAGNOSIS — I509 Heart failure, unspecified: Secondary | ICD-10-CM | POA: Diagnosis not present

## 2021-04-27 DIAGNOSIS — I248 Other forms of acute ischemic heart disease: Secondary | ICD-10-CM | POA: Diagnosis present

## 2021-04-27 DIAGNOSIS — R457 State of emotional shock and stress, unspecified: Secondary | ICD-10-CM | POA: Diagnosis not present

## 2021-04-27 DIAGNOSIS — N400 Enlarged prostate without lower urinary tract symptoms: Secondary | ICD-10-CM | POA: Diagnosis present

## 2021-04-27 DIAGNOSIS — I959 Hypotension, unspecified: Secondary | ICD-10-CM | POA: Diagnosis not present

## 2021-04-27 DIAGNOSIS — R0602 Shortness of breath: Secondary | ICD-10-CM

## 2021-04-27 DIAGNOSIS — Z91041 Radiographic dye allergy status: Secondary | ICD-10-CM | POA: Diagnosis not present

## 2021-04-27 DIAGNOSIS — I517 Cardiomegaly: Secondary | ICD-10-CM | POA: Diagnosis not present

## 2021-04-27 DIAGNOSIS — R0902 Hypoxemia: Secondary | ICD-10-CM

## 2021-04-27 DIAGNOSIS — F17210 Nicotine dependence, cigarettes, uncomplicated: Secondary | ICD-10-CM | POA: Diagnosis not present

## 2021-04-27 DIAGNOSIS — J441 Chronic obstructive pulmonary disease with (acute) exacerbation: Secondary | ICD-10-CM | POA: Diagnosis not present

## 2021-04-27 DIAGNOSIS — I129 Hypertensive chronic kidney disease with stage 1 through stage 4 chronic kidney disease, or unspecified chronic kidney disease: Secondary | ICD-10-CM | POA: Diagnosis not present

## 2021-04-27 DIAGNOSIS — R079 Chest pain, unspecified: Secondary | ICD-10-CM | POA: Diagnosis not present

## 2021-04-27 DIAGNOSIS — I110 Hypertensive heart disease with heart failure: Secondary | ICD-10-CM | POA: Diagnosis not present

## 2021-04-27 DIAGNOSIS — I10 Essential (primary) hypertension: Secondary | ICD-10-CM | POA: Diagnosis not present

## 2021-04-27 DIAGNOSIS — N184 Chronic kidney disease, stage 4 (severe): Secondary | ICD-10-CM | POA: Diagnosis present

## 2021-04-27 LAB — CBC
HCT: 35 % — ABNORMAL LOW (ref 39.0–52.0)
HCT: 33.1 % — ABNORMAL LOW (ref 39.0–52.0)
Hemoglobin: 11.4 g/dL — ABNORMAL LOW (ref 13.0–17.0)
Hemoglobin: 10.9 g/dL — ABNORMAL LOW (ref 13.0–17.0)
MCH: 29.2 pg (ref 26.0–34.0)
MCH: 29.9 pg (ref 26.0–34.0)
MCHC: 32.6 g/dL (ref 30.0–36.0)
MCHC: 32.9 g/dL (ref 30.0–36.0)
MCV: 89.7 fL (ref 80.0–100.0)
MCV: 90.9 fL (ref 80.0–100.0)
Platelets: 168 10*3/uL (ref 150–400)
Platelets: 144 10*3/uL — ABNORMAL LOW (ref 150–400)
RBC: 3.9 MIL/uL — ABNORMAL LOW (ref 4.22–5.81)
RBC: 3.64 MIL/uL — ABNORMAL LOW (ref 4.22–5.81)
RDW: 13.4 % (ref 11.5–15.5)
RDW: 13.4 % (ref 11.5–15.5)
WBC: 7 10*3/uL (ref 4.0–10.5)
WBC: 6.3 10*3/uL (ref 4.0–10.5)
nRBC: 0 % (ref 0.0–0.2)
nRBC: 0 % (ref 0.0–0.2)

## 2021-04-27 LAB — BASIC METABOLIC PANEL
Anion gap: 6 (ref 5–15)
BUN: 40 mg/dL — ABNORMAL HIGH (ref 8–23)
CO2: 24 mmol/L (ref 22–32)
Calcium: 8.2 mg/dL — ABNORMAL LOW (ref 8.9–10.3)
Chloride: 108 mmol/L (ref 98–111)
Creatinine, Ser: 2.83 mg/dL — ABNORMAL HIGH (ref 0.61–1.24)
GFR, Estimated: 22 mL/min — ABNORMAL LOW (ref >60)
Glucose, Bld: 114 mg/dL — ABNORMAL HIGH (ref 70–99)
Potassium: 3.9 mmol/L (ref 3.5–5.1)
Sodium: 138 mmol/L (ref 135–145)

## 2021-04-27 LAB — CREATININE, SERUM
Creatinine, Ser: 3.1 mg/dL — ABNORMAL HIGH (ref 0.61–1.24)
GFR, Estimated: 19 mL/min — ABNORMAL LOW (ref >60)

## 2021-04-27 LAB — TROPONIN I (HIGH SENSITIVITY)
Troponin I (High Sensitivity): 253 ng/L (ref <18)
Troponin I (High Sensitivity): 259 ng/L (ref <18)

## 2021-04-27 LAB — RESP PANEL BY RT-PCR (FLU A&B, COVID) ARPGX2
Influenza A by PCR: NEGATIVE
Influenza B by PCR: NEGATIVE
SARS Coronavirus 2 by RT PCR: NEGATIVE

## 2021-04-27 LAB — BRAIN NATRIURETIC PEPTIDE: B Natriuretic Peptide: 2988.6 pg/mL — ABNORMAL HIGH (ref 0.0–100.0)

## 2021-04-27 LAB — MAGNESIUM: Magnesium: 1.8 mg/dL (ref 1.7–2.4)

## 2021-04-27 LAB — POTASSIUM: Potassium: 4 mmol/L (ref 3.5–5.1)

## 2021-04-27 MED ORDER — FUROSEMIDE 10 MG/ML IJ SOLN
40.0000 mg | Freq: Two times a day (BID) | INTRAMUSCULAR | Status: DC
  Administered 2021-04-28 – 2021-04-29 (×4): 40 mg via INTRAVENOUS
  Filled 2021-04-27 (×5): qty 4

## 2021-04-27 MED ORDER — LOSARTAN POTASSIUM 50 MG PO TABS
100.0000 mg | ORAL_TABLET | Freq: Every day | ORAL | Status: DC
  Administered 2021-04-27: 100 mg via ORAL
  Filled 2021-04-27: qty 2

## 2021-04-27 MED ORDER — LABETALOL HCL 5 MG/ML IV SOLN
20.0000 mg | INTRAVENOUS | Status: DC | PRN
  Administered 2021-05-01: 20 mg via INTRAVENOUS
  Filled 2021-04-27: qty 4

## 2021-04-27 MED ORDER — SPIRONOLACTONE 25 MG PO TABS: 25.0000 mg | ORAL_TABLET | Freq: Every day | ORAL | Status: DC

## 2021-04-27 MED ORDER — SODIUM CHLORIDE 0.9 % IV SOLN
250.0000 mL | INTRAVENOUS | Status: DC | PRN
Start: 2021-04-27 — End: 2021-05-02

## 2021-04-27 MED ORDER — ENOXAPARIN SODIUM 40 MG/0.4ML IJ SOSY: 40.0000 mg | PREFILLED_SYRINGE | INTRAMUSCULAR | Status: DC

## 2021-04-27 MED ORDER — SODIUM CHLORIDE 0.9% FLUSH
3.0000 mL | Freq: Two times a day (BID) | INTRAVENOUS | Status: DC
  Administered 2021-04-27 – 2021-05-02 (×10): 3 mL via INTRAVENOUS

## 2021-04-27 MED ORDER — NITROGLYCERIN 2 % TD OINT
1.0000 [in_us] | TOPICAL_OINTMENT | Freq: Four times a day (QID) | TRANSDERMAL | Status: DC | PRN
  Administered 2021-04-27: 1 [in_us] via TOPICAL
  Filled 2021-04-27: qty 1

## 2021-04-27 MED ORDER — ATORVASTATIN CALCIUM 20 MG PO TABS
40.0000 mg | ORAL_TABLET | Freq: Every day | ORAL | Status: DC
  Administered 2021-04-28 – 2021-05-02 (×5): 40 mg via ORAL
  Filled 2021-04-27 (×5): qty 2

## 2021-04-27 MED ORDER — VITAMIN B-12 1000 MCG PO TABS
1000.0000 ug | ORAL_TABLET | Freq: Every day | ORAL | Status: DC
  Administered 2021-04-28 – 2021-05-02 (×5): 1000 ug via ORAL
  Filled 2021-04-27 (×6): qty 1

## 2021-04-27 MED ORDER — ONDANSETRON HCL 4 MG/2ML IJ SOLN: 4.0000 mg | Freq: Four times a day (QID) | INTRAMUSCULAR | Status: DC | PRN

## 2021-04-27 MED ORDER — ENOXAPARIN SODIUM 30 MG/0.3ML IJ SOSY
30.0000 mg | PREFILLED_SYRINGE | INTRAMUSCULAR | Status: DC
  Administered 2021-04-27 – 2021-05-01 (×5): 30 mg via SUBCUTANEOUS
  Filled 2021-04-27 (×5): qty 0.3

## 2021-04-27 MED ORDER — ACETAMINOPHEN 325 MG PO TABS: 650.0000 mg | ORAL_TABLET | ORAL | Status: DC | PRN

## 2021-04-27 MED ORDER — POTASSIUM CHLORIDE 20 MEQ PO PACK
40.0000 meq | PACK | Freq: Once | ORAL | Status: DC
  Filled 2021-04-27: qty 2

## 2021-04-27 MED ORDER — HYDRALAZINE HCL 50 MG PO TABS
100.0000 mg | ORAL_TABLET | Freq: Three times a day (TID) | ORAL | Status: DC
  Administered 2021-04-27 – 2021-05-02 (×14): 100 mg via ORAL
  Filled 2021-04-27 (×14): qty 2

## 2021-04-27 MED ORDER — CLOBETASOL PROPIONATE 0.05 % EX CREA
TOPICAL_CREAM | Freq: Two times a day (BID) | CUTANEOUS | Status: DC
  Filled 2021-04-27 (×2): qty 15

## 2021-04-27 MED ORDER — FUROSEMIDE 10 MG/ML IJ SOLN
40.0000 mg | Freq: Once | INTRAMUSCULAR | Status: AC
Start: 2021-04-27 — End: 2021-04-27
  Administered 2021-04-27: 40 mg via INTRAVENOUS
  Filled 2021-04-27: qty 4

## 2021-04-27 MED ORDER — LABETALOL HCL 5 MG/ML IV SOLN
INTRAVENOUS | Status: AC
  Administered 2021-04-27: 20 mg via INTRAVENOUS
  Filled 2021-04-27: qty 4

## 2021-04-27 MED ORDER — ALPRAZOLAM 0.25 MG PO TABS
0.2500 mg | ORAL_TABLET | Freq: Two times a day (BID) | ORAL | Status: DC | PRN
  Administered 2021-05-01: 0.25 mg via ORAL
  Filled 2021-04-27: qty 1

## 2021-04-27 MED ORDER — LORATADINE 10 MG PO TABS
10.0000 mg | ORAL_TABLET | Freq: Every day | ORAL | Status: DC
  Administered 2021-04-27 – 2021-05-02 (×6): 10 mg via ORAL
  Filled 2021-04-27 (×6): qty 1

## 2021-04-27 MED ORDER — ISOSORBIDE MONONITRATE ER 60 MG PO TB24: 30.0000 mg | ORAL_TABLET | Freq: Every day | ORAL | Status: DC

## 2021-04-27 MED ORDER — FINASTERIDE 5 MG PO TABS
5.0000 mg | ORAL_TABLET | Freq: Every day | ORAL | Status: DC
  Administered 2021-04-28 – 2021-05-02 (×5): 5 mg via ORAL
  Filled 2021-04-27 (×6): qty 1

## 2021-04-27 MED ORDER — GLUCOSAMINE-CHONDROITIN-VIT C 2000-1200-60 MG/30ML PO LIQD: Freq: Every day | ORAL | Status: DC

## 2021-04-27 MED ORDER — PANTOPRAZOLE SODIUM 40 MG PO TBEC
40.0000 mg | DELAYED_RELEASE_TABLET | Freq: Every day | ORAL | Status: DC
  Administered 2021-04-28 – 2021-05-02 (×5): 40 mg via ORAL
  Filled 2021-04-27 (×5): qty 1

## 2021-04-27 MED ORDER — CARVEDILOL 6.25 MG PO TABS
6.2500 mg | ORAL_TABLET | Freq: Two times a day (BID) | ORAL | Status: DC
  Administered 2021-04-28 – 2021-05-02 (×10): 6.25 mg via ORAL
  Filled 2021-04-27 (×10): qty 1

## 2021-04-27 MED ORDER — SODIUM CHLORIDE 0.9% FLUSH
3.0000 mL | INTRAVENOUS | Status: DC | PRN
Start: 2021-04-27 — End: 2021-05-02

## 2021-04-27 MED ORDER — TAMSULOSIN HCL 0.4 MG PO CAPS
0.4000 mg | ORAL_CAPSULE | Freq: Every day | ORAL | Status: DC
  Administered 2021-04-28 – 2021-05-02 (×5): 0.4 mg via ORAL
  Filled 2021-04-27 (×5): qty 1

## 2021-04-27 MED ORDER — CLOPIDOGREL BISULFATE 75 MG PO TABS
75.0000 mg | ORAL_TABLET | Freq: Every day | ORAL | Status: DC
  Administered 2021-04-27 – 2021-05-02 (×6): 75 mg via ORAL
  Filled 2021-04-27 (×6): qty 1

## 2021-04-27 NOTE — ED Notes (Signed)
Pacemaker interrogated at this time.

## 2021-04-27 NOTE — ED Notes (Signed)
RN at bedside to ambulate pt per MD request. Pt ambulatory to the bedside commode with walker. Pt oxygen saturations dropped to 90% upon initially getting pt to side of bed. Overall oxygen saturations stayed between 93-95% on RA. Pt did endorse some dizziness upon returning to stretcher. Pt also disclosed to this RN that he feels his defibrillator fired while sitting in the waiting room. MD aware.

## 2021-04-27 NOTE — ED Notes (Addendum)
Messaged providers re: continued hypertension despite Labetalol IV admin and asked if any further orders at this time. Per providers, administer Nitroglycerin and give PO hydralazine. No other IV meds at this time.

## 2021-04-27 NOTE — ED Notes (Addendum)
Dr. Sidney Ace at bedside- requested manual BP and labetalol IV admin due to hypertension

## 2021-04-27 NOTE — ED Triage Notes (Addendum)
See first nurse note- Pt to ER with [facility], reports shortness of breath has worsened today but last Sunday he also felt the same. Hx of CHF. Worsening ankle swelling. Swelling also present to left forearm, no injury. Also reports tightness in his chest and palpitations.   Reports being d/c from rehab last night. Similar circumstances happened two weeks ago where he was d/c and became short of breath and had to be seen in the ER the next day.   Placed on 2L via [place] for comfort, room air sats 93%. Improvement of sats to 97%.

## 2021-04-27 NOTE — ED Provider Notes (Signed)
[facility] Provider Note    Event Date/Time   First MD Initiated Contact with Patient 04/27/21 1640     (approximate)   History   Shortness of Breath   HPI  Peter [name] is a 81 y.o. male with CHF, hypertension, hyperlipidemia who comes in with concerns for shortness of breath.  Patient reports that he was in a rehab facility and was intermittently using oxygen as needed.Marland Kitchen  He reports that he needs the oxygen otherwise he feels extremely short of breath.  However he states that they did not discharge him with oxygen and he went home and he developed severe shortness of breath.  He states that Ii I discharge him without oxygen and he is going to end up in morgue.  He reports being compliant with all of his medications.  On recent ER visit he had a work-up with a negative VQ scan.  I reviewed the note from 12/22 through 12/28 where patient was admitted.  He noted that around Thanksgiving he had RSV and influenza he had come in for leg swelling.  He had received 2 units of PRBCs Lasix IV for his heart failure baseline creatinine is 2.31 was up into the 200s but was flat.  It appears that they were holding patient's Lasix due to worsening CKD.  Physical Exam   Triage Vital Signs: ED Triage Vitals  Enc Vitals Group     BP 04/27/21 1303 (!) 197/85     Pulse Rate 04/27/21 1300 75     Resp 04/27/21 1300 (!) 25     Temp 04/27/21 1300 98.3 F (36.8 C)     Temp Source 04/27/21 1300 Oral     SpO2 04/27/21 1300 93 %     Weight 04/27/21 1300 205 lb (93 kg)     Height 04/27/21 1300 5\' 11"  (1.803 m)     Head Circumference --      Peak Flow --      Pain Score 04/27/21 1300 0     Pain Loc --      Pain Edu? --      Excl. in [place]? --     Most recent vital signs: Vitals:   04/27/21 1600 04/27/21 1630  BP:  (!) 180/88  Pulse: 65 69  Resp: 14 14  Temp:    SpO2: 97% 98%     General: Awake, no distress. Well appearing  CV:  Good peripheral perfusion. L arm  slightly swollen compared to the R but good distal pulse and recent imaging negative for DVT.  ICD palpated Resp:  Normal effort. Clear lungs on 2L  Abd:  No distention. Soft non tenderness  Other:  Trace edema bilateral    ED Results / Procedures / Treatments   Labs (all labs ordered are listed, but only abnormal results are displayed) Labs Reviewed  BASIC METABOLIC PANEL - Abnormal; Notable for the following components:      Result Value   Glucose, Bld 114 (*)    BUN 40 (*)    Creatinine, Ser 2.83 (*)    Calcium 8.2 (*)    GFR, Estimated 22 (*)    All other components within normal limits  CBC - Abnormal; Notable for the following components:   RBC 3.90 (*)    Hemoglobin 11.4 (*)    HCT 35.0 (*)    All other components within normal limits  TROPONIN I (HIGH SENSITIVITY) - Abnormal; Notable for the following components:   Troponin  I (High Sensitivity) 253 (*)    All other components within normal limits  TROPONIN I (HIGH SENSITIVITY)     EKG  My interpretation of EKG:  Normal sinus rate of 75 without any ST elevations or T wave inversions with a lot of artifact and PVCs.  We will get a repeat  RADIOLOGY I have reviewed the xray personally and agree with radiology read some mild pleural effusions   PROCEDURES:  Critical Care performed: No  .1-3 Lead EKG Interpretation Performed by: Vanessa Solis, MD Authorized by: Vanessa [place], MD     Interpretation: normal     ECG rate:  70   ECG rate assessment: normal     Rhythm: sinus rhythm     Ectopy: PVCs     Conduction: normal   Comments:     Occasionally starts pacing and occasional PVCs   MEDICATIONS ORDERED IN ED: Medications  furosemide (LASIX) injection 40 mg (40 mg Intravenous Given 04/27/21 1828)     IMPRESSION / MDM / [place] / ED COURSE  I reviewed the triage vital signs and the nursing notes.   Differential diagnosis includes, but is not limited to, CHF, hypertension, ACS.  Low  suspicion for PE given recent work-ups have been negative.  No wheezing to suggest active COPD.  We will get COVID, flu.  Doubt pulmonary embolism given recent work-up was negative  Patient's troponin is  elevated but similar to his priors in the past.  His hemoglobin is stable at 11.  White count normal  His kidney function is downtrending from previous.  Repeat troponin is stable  The patient is on the cardiac monitor to evaluate for evidence of arrhythmia and/or significant heart rate changes.  His BNP is 3 times is normal and the interrogated his pacemaker/ICD that showed onset of fluid accumulation with increase in PVC count   6:13 PM patient desatted down to 90% on room air.  We discussed discharge versus coming to the hospital he did not feel comfortable going home at all.  He states that he cannot go home without any oxygen.  I explained that his oxygen levels were staying above 90% but he stated that he felt very short of breath.  His BNP is significantly more elevated than previous and his chest x-ray shows some pulmonary edema so I do suspect that he is having a CHF exacerbation.  We will give a dose of IV Lasix and discuss hospital to admit.   FINAL CLINICAL IMPRESSION(S) / ED DIAGNOSES   Final diagnoses:  Acute on chronic congestive heart failure, unspecified heart failure type (HCC)  Elevated troponin     Rx / DC Orders   ED Discharge Orders     None        Note:  This document was prepared using Dragon voice recognition software and may include unintentional dictation errors.   Vanessa Smyer, MD 04/27/21 [phone]

## 2021-04-27 NOTE — ED Notes (Signed)
IV team at bedside 

## 2021-04-27 NOTE — Progress Notes (Signed)
PHARMACIST - PHYSICIAN ORDER COMMUNICATION  CONCERNING: P&T Medication Policy on Herbal Medications  DESCRIPTION:  This patients order for:  glucosamine/chodroitin  has been noted.  This product(s) is classified as an herbal or natural product. Due to a lack of definitive safety studies or FDA approval, nonstandard manufacturing practices, plus the potential risk of unknown drug-drug interactions while on inpatient medications, the Pharmacy and Therapeutics Committee does not permit the use of herbal or natural products of this type within [facility].   ACTION TAKEN: The pharmacy department is unable to verify this order at this time and your patient has been informed of this safety policy. Please reevaluate patients clinical condition at discharge and address if the herbal or natural product(s) should be resumed at that time.

## 2021-04-27 NOTE — Progress Notes (Signed)
PHARMACIST - PHYSICIAN COMMUNICATION  CONCERNING:  Enoxaparin (Lovenox) for DVT Prophylaxis    RECOMMENDATION: Patient was prescribed enoxaprin 40mg  q24 hours for VTE prophylaxis.   Filed Weights   04/27/21 1300  Weight: 93 kg (205 lb)    Body mass index is 28.59 kg/m.  Estimated Creatinine Clearance: 23.9 mL/min (A) (by C-G formula based on SCr of 2.83 mg/dL (H)).   Patient is candidate for enoxaparin 30mg  every 24 hours based on CrCl <78ml/min or Weight <45kg  DESCRIPTION: Pharmacy has adjusted enoxaparin dose per [facility] policy.  Patient is now receiving enoxaparin 30 mg every 24 hours    Tomeka Kantner Rodriguez-Guzman PharmD, BCPS 04/27/2021 7:47 PM

## 2021-04-27 NOTE — ED Triage Notes (Signed)
Pt in via EMS from home with c/o SOB that started an hours ago. Pt with hx of CHF. EMS placed pt on O2 and he felt better. RA 95-96%, placed on 2L for 10 minutes and it was 99%. Pt also had 20mg  of lasix prior to EMS arrival.

## 2021-04-27 NOTE — ED Notes (Signed)
Provided pt with [place] food tray and water.

## 2021-04-27 NOTE — ED Notes (Addendum)
Assisted patient to in room bathroom to defecate-unhooked oxygen and pt was SOB after. Replaced nasal cannula and after patient rested in bed for a couple minutes, respiratory rate improved and patient reported no longer SOB.

## 2021-04-27 NOTE — H&P (Signed)
Peter [name]   PATIENT NAME: Peter [name]    MR#:  [id number]  DATE OF BIRTH:  May 08, 1940  DATE OF ADMISSION:  04/27/2021  PRIMARY CARE PHYSICIAN: Tracie Harrier, MD   Patient is coming from: Home  REQUESTING/REFERRING PHYSICIAN: Marjean Donna, MD  CHIEF COMPLAINT:   Chief Complaint  Patient presents with   Shortness of Breath    HISTORY OF PRESENT ILLNESS:  Peter [name] is a 81 y.o. Caucasian male with medical history significant for systolic and diastolic CHF, coronary artery disease, hypertension, dyslipidemia and coronary artery disease and non-STEMI status post PCI and stent, and status post AICD, who presented to the ER with acute onset of significantly worsening dyspnea today.  He concurred with orthopnea and paroxysmal nocturnal dyspnea as well as dyspnea on exertion.  Patient was admitted here from 12/22 to 04/09/2021 for symptomatic anemia for which she received 2 units of packed red blood cells and acute on chronic diastolic CHF and was then diuresed with IV Lasix however given worsening acute kidney injury on CKD stage IV his Lasix was held off.  He stated that he is not taking Lasix since then.  He was discharged to rehabitation and has been on oxygen there.  He was discharged home from rehab today and did not have oxygen at home.  He has been otherwise compliant with all of his medications.  The patient's troponins were elevated in the 200s during his last admission.  Pulse oximetry was down to 90% on room air.  The patient felt like his defibrillator may have went off while in the waiting room.  It was interrogated by the ER physician and showed increased PVCs count.  ED Course: When he came to the ER, blood pressure was 197/85 and respiratory rate 25 with a pulse symmetry 93% on room air and has gone down to 90% on ambulation.  BP later normalized up to 201/94.  BMP tonight showed a BUN of 40 and creatinine 2.83 compared to 47/3.26 on 9/9.  At discharge on 12/28  BUN was 57 and creatinine 3.13 up from 67/2.95 at the time of his admission on 12/22. BNP was 2988.6.  High-sensitivity troponin was 253 and later 259.  CBC showed hemoglobin of 11.4 hematocrit 35 up from previous levels.  Influenza antigens and COVID-19 PCR came back negative. EKG as reviewed by me : EKG showed normal sinus rhythm with a rate of 78 with multiform PVCs and left bundle blanch block. Imaging: 2 view chest x-ray showed bilateral small pleural effusions.  The patient was given 40 mg of IV Lasix.  He will be admitted to a cardiac telemetry bed for further evaluation and management.   PAST MEDICAL HISTORY:   Past Medical History:  Diagnosis Date   Anginal pain ([place])    Asthma    Bladder infection, acute 03/2011   "had a whole lot of bleeding from this"   CHF (congestive heart failure) ([place])    Coronary artery disease    High cholesterol    Hypertension    Macular degeneration 12/14/2011   "had it in my right; getting shots now in my left"   Myocardial infarction [facility]) 1996   NSTEMI (non-ST elevated myocardial infarction) ([place]) 12/14/2011   Shortness of breath 12/14/2011   "@ rest, lying down, w/exertion"    PAST SURGICAL HISTORY:   Past Surgical History:  Procedure Laterality Date   [place]; ~ 2003; ~ 2008   "  total of 3"   CORONARY STENT INTERVENTION N/A 08/10/2016   Procedure: Coronary Stent Intervention;  Surgeon: [name], MD;  Location: [facility];  Service: Cardiovascular;  Laterality: N/A;   HERNIA REPAIR  ~ 2001   "abdominal w/mesh implanted"   LEFT HEART CATH AND CORONARY ANGIOGRAPHY N/A 08/10/2016   Procedure: Left Heart Cath and Coronary Angiography;  Surgeon: [name], MD;  Location: [facility];  Service: Cardiovascular;  Laterality: N/A;   LEFT HEART CATHETERIZATION WITH CORONARY ANGIOGRAM N/A 12/16/2011   Procedure: LEFT HEART CATHETERIZATION WITH CORONARY ANGIOGRAM;  Surgeon:  Minus Breeding, MD;  Location: [facility] CATH LAB;  Service: Cardiovascular;  Laterality: N/A;   PERCUTANEOUS CORONARY STENT INTERVENTION (PCI-S) N/A 12/17/2011   Procedure: PERCUTANEOUS CORONARY STENT INTERVENTION (PCI-S);  Surgeon: Sherren Mocha, MD;  Location: [facility] CATH LAB;  Service: Cardiovascular;  Laterality: N/A;   TONSILLECTOMY     "I was a kid"  -AICD in September 2019 -Insertion, replacement and removal of pacemaker. - Aortic stent SOCIAL HISTORY:   Social History   Tobacco Use   Smoking status: Former    Packs/day: 0.50    Years: 0.50    Pack years: 0.25    Types: Cigarettes   Smokeless tobacco: Never   Tobacco comments:    11/23/16 Still not ready to quit smoking.  Substance Use Topics   Alcohol use: Yes    Alcohol/week: 1.0 standard drink    Types: 1 Cans of beer per week    Comment: 12/14/2011 "if my kidneys are sore, I drink 1 beer/day; if not sore; no beer; occasionally drink socially; ave 1 beer/wk maybe"    FAMILY HISTORY:   Family History  Family history unknown: Yes    DRUG ALLERGIES:   Allergies  Allergen Reactions   Iodinated Contrast Media Shortness Of Breath   Iodinated Glycerol  [Glycerol, Iodinated] Shortness Of Breath    REVIEW OF SYSTEMS:   ROS As per history of present illness. All pertinent systems were reviewed above. Constitutional, HEENT, cardiovascular, respiratory, GI, GU, musculoskeletal, neuro, psychiatric, endocrine, integumentary and hematologic systems were reviewed and are otherwise negative/unremarkable except for positive findings mentioned above in the HPI.   MEDICATIONS AT HOME:   Prior to Admission medications   Medication Sig Start Date End Date Taking? Authorizing Provider  atorvastatin (LIPITOR) 40 MG tablet Take 1 tablet (40 mg total) by mouth daily at 6 PM. 12/18/11   Annita Brod, MD  carvedilol (COREG) 6.25 MG tablet Take 6.25 mg by mouth 2 (two) times daily with a meal.    [provider]  cetirizine (ZYRTEC)  10 MG tablet Take 10 mg by mouth.    [provider]  clobetasol cream (TEMOVATE) 0.05 % APPLY TO PSORIASIS BID PRN UNTIL SMOOTH 11/05/14   [provider]  clopidogrel (PLAVIX) 75 MG tablet TAKE 1 TABLET(75 MG) BY MOUTH EVERY DAY 08/11/16   Fritzi Mandes, MD  cyanocobalamin 1000 MCG tablet Take 1,000 mcg by mouth daily.     [provider]  finasteride (PROSCAR) 5 MG tablet Take 1 tablet (5 mg total) by mouth daily. 02/22/20   Billey Co, MD  furosemide (LASIX) 20 MG tablet Hold this medication until followup with nephrology or cardiology. 04/08/21   Enzo Bi, MD  GLUCOSAMINE-CHONDROITIN-VIT C PO Take 1 tablet by mouth daily.    [provider]  hydrALAZINE (APRESOLINE) 100 MG tablet Take 1 tablet (100 mg total) by mouth every 8 (eight) hours. 04/08/21 07/07/21  Enzo Bi, MD  isosorbide mononitrate (IMDUR) 30 MG 24 hr tablet Take 30 mg by mouth daily. 06/18/16   [provider]  losartan (COZAAR) 100 MG tablet Hold until followup with your outpatient doctor due to worsening kidney function. 04/08/21   Enzo Bi, MD  omeprazole (PRILOSEC) 20 MG capsule Take by mouth. 01/16/16   [provider]  spironolactone (ALDACTONE) 25 MG tablet Hold until followup with your outpatient doctor due to worsening kidney function. 04/08/21   Enzo Bi, MD  tamsulosin (FLOMAX) 0.4 MG CAPS capsule Take 1 capsule (0.4 mg total) by mouth daily. 02/22/20   Billey Co, MD      VITAL SIGNS:  Blood pressure (!) 189/102, pulse 70, temperature 98.3 F (36.8 C), temperature source Oral, resp. rate 16, height 5\' 11"  (1.803 m), weight 93 kg, SpO2 97 %.  PHYSICAL EXAMINATION:  Physical Exam  GENERAL:  81 y.o.-year-old Caucasian male patient lying in the bed with mild respiratory distress with conversational dyspnea. EYES: Pupils equal, round, reactive to light and accommodation. No scleral icterus. Extraocular muscles intact.  HEENT: Head atraumatic,  normocephalic. Oropharynx and nasopharynx clear.  NECK:  Supple, no jugular venous distention. No thyroid enlargement, no tenderness.  LUNGS: Diminished  bibasilar breath sounds. No use of accessory muscles of respiration.  CARDIOVASCULAR: Regular rate and rhythm, S1, S2 normal. No murmurs, rubs, or gallops.  ABDOMEN: Soft, nondistended, nontender. Bowel sounds present. No organomegaly or mass.  EXTREMITIES: 1-2+ bilateral lower extremity pitting edema with no cyanosis, or clubbing.  Trace bilateral upper extremity pitting edema. NEUROLOGIC: Cranial nerves II through XII are intact. Muscle strength 5/5 in all extremities. Sensation intact. Gait not checked.  PSYCHIATRIC: The patient is alert and oriented x 3.  Normal affect and good eye contact. SKIN: No obvious rash, lesion, or ulcer.   LABORATORY PANEL:   CBC Recent Labs  Lab 04/27/21 1305  WBC 7.0  HGB 11.4*  HCT 35.0*  PLT 168   ------------------------------------------------------------------------------------------------------------------  Chemistries  Recent Labs  Lab 04/27/21 1305  NA 138  K 3.9  CL 108  CO2 24  GLUCOSE 114*  BUN 40*  CREATININE 2.83*  CALCIUM 8.2*   ------------------------------------------------------------------------------------------------------------------  Cardiac Enzymes No results for input(s): TROPONINI in the last 168 hours. ------------------------------------------------------------------------------------------------------------------  RADIOLOGY:  DG Chest 2 View  Result Date: 04/27/2021 CLINICAL DATA:  Chest pain. EXAM: CHEST - 2 VIEW COMPARISON:  None. FINDINGS: Stable position of cardiac pacemaker.  Aortic stent graft noted. Tortuosity and calcific atherosclerotic disease of the aorta. Cardiomediastinal silhouette is normal. Mediastinal contours appear intact. There is no evidence of focal airspace consolidation, pleural effusion or pneumothorax. Probable bilateral small pleural  effusions. Osseous structures are without acute abnormality. Soft tissues are grossly normal. IMPRESSION: Probable bilateral small pleural effusions. Electronically Signed   By: Fidela Salisbury M.D.   On: 04/27/2021 13:45      IMPRESSION AND PLAN:  Principal Problem:   Acute exacerbation of CHF (congestive heart failure) ([place])  1.  Acute on chronic diastolic CHF.  The patient is to have a history of systolic CHF which apparently has resolved since his last 2D echo on 04/04/2021 during his last admission, revealed an EF of 50 to 55% with no regional wall motion abnormalities and with grade 2 diastolic dysfunction, mild mitral and aortic valvular regurgitation. - The patient will be admitted to a cardiac telemetry bed. - We will continue diuresis with IV Lasix. - I have advised him to avoid holding off Lasix. -  We will follow serial troponin and especially given currently elevated 1. - We will obtain a follow-up cardiology consultation. - I notified Dr. Corky Sox about the patient.  2.  Hypertensive urgency. - This is one of the main culprits for his acute CHF besides being off Lasix. - We we will place him on as needed IV labetalol. - We will resume his Coreg, hydralazine, Imdur, Cozaar and Aldactone.  3.  Elevated troponin I in the setting of coronary artery disease status post PCI and stent. - This has been elevated during last admission and there is no significant bump today.  We will continue to follow serial troponins while he is here. - We will continue continue Imdur, Aldactone, Coreg and Cozaar. - We will add enteric-coated baby aspirin.  4.  Mild hypoxemia. - The patient wishes to end up going home on home O2 as he feels a significant part of his deterioration was due to lack of home O2 and that he would have died without it. - We will monitor his pulse oximetry while he is here.  5.  Multifocal PVCs. - The patient is status post AICD placement and had suspected AICD discharge  tonight. - His AICD was interrogated and showed multiple PVCs. -We will check his magnesium level and optimize it. - We will optimize his potassium level. - Cardiology consult will be obtained as mentioned above.  6.  Dyslipidemia. - We will continue statin therapy.  7.  BPH. - We will continue Proscar and Flomax.  8.  Mild emphysematous lung disease seen on CT during his last admission. - The patient was expected to see a pulmonologist tomorrow. - We will place him on as needed DuoNebs. - We will obtain a pulmonary consult tomorrow. - I notified Dr. Keenan Bachelor.   DVT prophylaxis: Lovenox. Code Status: full code. Family Communication:  The plan of care was discussed in details with the patient (and family). I answered all questions. The patient agreed to proceed with the above mentioned plan. Further management will depend upon hospital course. Disposition Plan: Back to previous home environment Consults called: Cardiology.   All the records are reviewed and case discussed with ED provider.  Status is: Inpatient   At the time of the admission, it appears that the appropriate admission status for this patient is inpatient.  This is judged to be reasonable and necessary in order to provide the required intensity of service to ensure the patient's safety given the presenting symptoms, physical exam findings and initial radiographic and laboratory data in the context of comorbid conditions.  The patient requires inpatient status due to high intensity of service, high risk of further deterioration and high frequency of surveillance required.  I certify that at the time of admission, it is my clinical judgment that the patient will require inpatient hospital care extending more than 2 midnights.                            Dispo: The patient is from: Home              Anticipated d/c is to: Home              Patient currently is not medically stable to d/c.              Difficult to  place patient: No  Christel Mormon M.D on 04/27/2021 at 8:14 PM  [facility]   From 7 PM-7 AM,  contact night-coverage [email or web address]  CC: Primary care physician; Tracie Harrier, MD

## 2021-04-27 NOTE — ED Notes (Signed)
RN attempting IV access x2 without success. IV team consult placed.

## 2021-04-27 NOTE — ED Notes (Addendum)
Per daughter based on 12/28 discharges and timing of meds listed on there and times administered at home: PM meds that are due - Atorvastatin, Carvedilol, Hydralazine This am pt already had Cetririzine, Clopidogrel, Cyanocobalamin, Finasteride, Glucosamine, Hydralazine am dose (q12h), isosorbide, omeprazole, Tamsulosin, Furosemide am. Daughter unsure of Losartan or spironolactone due to renal doctor verification.

## 2021-04-27 NOTE — ED Notes (Addendum)
RN at bedside to introduce self. Family with pt at this time

## 2021-04-28 LAB — BASIC METABOLIC PANEL
Anion gap: 9 (ref 5–15)
BUN: 44 mg/dL — ABNORMAL HIGH (ref 8–23)
CO2: 24 mmol/L (ref 22–32)
Calcium: 8.1 mg/dL — ABNORMAL LOW (ref 8.9–10.3)
Chloride: 109 mmol/L (ref 98–111)
Creatinine, Ser: 3.04 mg/dL — ABNORMAL HIGH (ref 0.61–1.24)
GFR, Estimated: 20 mL/min — ABNORMAL LOW (ref >60)
Glucose, Bld: 87 mg/dL (ref 70–99)
Potassium: 3.5 mmol/L (ref 3.5–5.1)
Sodium: 142 mmol/L (ref 135–145)

## 2021-04-28 MED ORDER — AMLODIPINE BESYLATE 5 MG PO TABS
5.0000 mg | ORAL_TABLET | Freq: Every day | ORAL | Status: DC
  Filled 2021-04-28: qty 1

## 2021-04-28 MED ORDER — AMLODIPINE BESYLATE 10 MG PO TABS
10.0000 mg | ORAL_TABLET | Freq: Every day | ORAL | Status: DC
  Administered 2021-04-28 – 2021-05-02 (×5): 10 mg via ORAL
  Filled 2021-04-28 (×3): qty 1
  Filled 2021-04-28: qty 2

## 2021-04-28 MED ORDER — MAGNESIUM SULFATE 2 GM/50ML IV SOLN
2.0000 g | Freq: Once | INTRAVENOUS | Status: AC
  Administered 2021-04-28: 2 g via INTRAVENOUS
  Filled 2021-04-28: qty 50

## 2021-04-28 MED ORDER — ISOSORBIDE MONONITRATE ER 60 MG PO TB24
60.0000 mg | ORAL_TABLET | Freq: Every day | ORAL | Status: DC
  Administered 2021-04-28 – 2021-05-02 (×5): 60 mg via ORAL
  Filled 2021-04-28 (×5): qty 1

## 2021-04-28 MED ORDER — POTASSIUM CHLORIDE CRYS ER 20 MEQ PO TBCR
40.0000 meq | EXTENDED_RELEASE_TABLET | Freq: Once | ORAL | Status: AC
  Administered 2021-04-28: 40 meq via ORAL
  Filled 2021-04-28: qty 2

## 2021-04-28 NOTE — Consult Note (Addendum)
CARDIOLOGY CONSULT NOTE    Patient ID: Peter [name] MRN: [id number] DOB/AGE: May 23, 1940 81 y.o.  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  Cardiology is consulted for management of his heart failure.  The patient was recently admitted from 12/22-12/28 for symptomatic anemia and acute on chronic heart failure where he received 2 units of PRBCs and was diuresed with IV Lasix.  He also had a worsening AKI on top of his CKD so p.o. Lasix was held at discharge to a rehab facility.  He was on oxygen while he was at rehab and he was sent home yesterday without supplemental oxygen.  His AICD was interrogated by the ER physician because patient felt like his defibrillator may have gone off while in the waiting room.  Interrogation revealed increased PVCs.  The patient states he was working with his physical therapist yesterday afternoon and all of a sudden "had to work to breathe" and could not catch his breath.  He states when EMS came and put oxygen on him he immediately felt fine, but whenever his oxygen was taken off of him he "had no breath."  He denies chest pain but admits to some heart racing and palpitations.  He states his lower extremity edema is better since he got Lasix in the ED.  Further interview with the family members as well as the patient suggest that the patient's dietary indiscretion was a major player and his current admission.  This includes concerns of significant amount of sodium intact  Vital signs on admission  are significant for blood pressure of 218/89 which has now come down to 171/70 during interview.  SPO2 is 97% on 2 L by nasal cannula.  Labs on admission are significant for a creatinine trending 2.83-3.04 and GFR 22-20, improved from 1/9 at 3.26 and 18.  BNP elevated to 2988.  Troponins 2053-259, however they were 191 and 194 1 week ago at discharge.  H&H 10.9/33.1, stable from his last admission.    Chest x-ray significant for probable bilateral small pleural effusions.  Review of systems complete and found to be negative unless listed above     Past Medical History:  Diagnosis Date   Anginal pain ([place])    Asthma    Bladder infection, acute 03/2011   "had a whole lot of bleeding from this"   CHF (congestive heart failure) ([place])    Coronary artery disease    High cholesterol    Hypertension    Macular degeneration 12/14/2011   "had it in my right; getting shots now in my left"   Myocardial infarction [facility]) 1996   NSTEMI (non-ST elevated myocardial infarction) ([place]) 12/14/2011   Shortness of breath 12/14/2011   "@ rest, lying down, w/exertion"    Past Surgical History:  Procedure Laterality Date   [employer]; ~ 2003; ~ 2008   "total of 3"   CORONARY STENT INTERVENTION N/A 08/10/2016   Procedure: Coronary Stent Intervention;  Surgeon: [name], MD;  Location: [place] [facility];  Service: Cardiovascular;  Laterality: N/A;   HERNIA REPAIR  ~ 2001   "  abdominal w/mesh implanted"   LEFT HEART CATH AND CORONARY ANGIOGRAPHY N/A 08/10/2016   Procedure: Left Heart Cath and Coronary Angiography;  Surgeon: [name], MD;  Location: [place] [facility];  Service: Cardiovascular;  Laterality: N/A;   LEFT HEART CATHETERIZATION WITH CORONARY ANGIOGRAM N/A 12/16/2011   Procedure: LEFT HEART CATHETERIZATION WITH CORONARY ANGIOGRAM;  Surgeon: Minus Breeding, MD;  Location: [facility] CATH LAB;  Service: Cardiovascular;  Laterality: N/A;   PERCUTANEOUS  CORONARY STENT INTERVENTION (PCI-S) N/A 12/17/2011   Procedure: PERCUTANEOUS CORONARY STENT INTERVENTION (PCI-S);  Surgeon: Sherren Mocha, MD;  Location: [facility] CATH LAB;  Service: Cardiovascular;  Laterality: N/A;   TONSILLECTOMY     "I was a kid"    (Not in a hospital admission)  Social History   Socioeconomic History   Marital status: Widowed    Spouse name: Not on file   Number of children: Not on file   Years of education: Not on file   Highest education level: Not on file  Occupational History   Not on file  Tobacco Use   Smoking status: Former    Packs/day: 0.50    Years: 0.50    Pack years: 0.25    Types: Cigarettes   Smokeless tobacco: Never   Tobacco comments:    11/23/16 Still not ready to quit smoking.  Substance and Sexual Activity   Alcohol use: Yes    Alcohol/week: 1.0 standard drink    Types: 1 Cans of beer per week    Comment: 12/14/2011 "if my kidneys are sore, I drink 1 beer/day; if not sore; no beer; occasionally drink socially; ave 1 beer/wk maybe"   Drug use: No   Sexual activity: Not Currently  Other Topics Concern   Not on file  Social History Narrative   Not on file   Social Determinants of Health   Financial Resource Strain: Not on file  Food Insecurity: Not on file  Transportation Needs: Not on file  Physical Activity: Not on file  Stress: Not on file  Social Connections: Not on file  Intimate Partner Violence: Not on file    Family History  Family history unknown: Yes      Review of systems complete and found to be negative unless listed above    PHYSICAL EXAM General: nomral , well nourished, in no acute distress. HEENT:  Normocephalic and atraumatic. Neck:  No JVD.  Lungs: Normal respiratory effort on setting quietly. Clear bilaterally to auscultation.  Some wheezes, bibasilar crackles crackles, rhonchi.  Heart: HRRR . Normal S1 and S2 without gallops or murmurs. Radial & DP pulses 2+ bilaterally. Abdomen: Non-distended appearing.   Msk: Normal strength and tone for age. Extremities: No clubbing, cyanosis or edema.   Neuro: Alert and oriented X 3. Psych:  Mood appropriate, affect congruent.   Labs:   Lab Results  Component Value Date   WBC 6.3 04/27/2021   HGB 10.9 (L) 04/27/2021   HCT 33.1 (L) 04/27/2021   MCV 90.9 04/27/2021   PLT 144 (L) 04/27/2021    Recent Labs  Lab 04/28/21 0611  NA 142  K 3.5  CL 109  CO2 24  BUN 44*  CREATININE 3.04*  CALCIUM 8.1*  GLUCOSE 87   Lab Results  Component Value Date   CKTOTAL 23 (L) 12/11/2013   CKMB 1.0 12/11/2013   TROPONINI 0.05 (HH) 12/21/2017    Lab Results  Component Value Date   CHOL 108 08/08/2016   CHOL 141 12/16/2011   Lab Results  Component  Value Date   HDL 36 (L) 08/08/2016   HDL 38 (L) 12/16/2011   Lab Results  Component Value Date   LDLCALC 50 08/08/2016   LDLCALC 83 12/16/2011   Lab Results  Component Value Date   TRIG 111 08/08/2016   TRIG 102 12/16/2011   Lab Results  Component Value Date   CHOLHDL 3.0 08/08/2016   CHOLHDL 3.7 12/16/2011   No results found for: LDLDIRECT    Radiology: CT ABDOMEN PELVIS WO CONTRAST  Result Date: 04/04/2021 CLINICAL DATA:  Retroperitoneal hemorrhage suspected. EXAM: CT ABDOMEN AND PELVIS WITHOUT CONTRAST TECHNIQUE: Multidetector CT imaging of the abdomen and pelvis was performed following the standard protocol without IV contrast. COMPARISON:  01/29/2016 FINDINGS: Lower chest: Bibasilar atelectasis noted with tiny bilateral pleural effusions. Hepatobiliary: No focal abnormality in the liver on this study without intravenous contrast. Layering tiny gallstones evident. No intrahepatic or extrahepatic biliary dilation. Pancreas: No focal mass lesion. No dilatation of the main duct. No intraparenchymal cyst. No peripancreatic edema. Spleen: No splenomegaly. No focal mass lesion. Adrenals/Urinary Tract: No adrenal nodule or mass. Small cyst noted upper pole left kidney. No evidence for hydroureter.  The urinary bladder appears normal for the degree of distention. Stomach/Bowel: Stomach is unremarkable. No gastric wall thickening. No evidence of outlet obstruction. Duodenum is normally positioned as is the ligament of Treitz. No small bowel wall thickening. No small bowel dilatation. The terminal ileum is normal. The appendix is normal. No gross colonic mass. No colonic wall thickening. Diverticular changes are noted in the left colon without evidence of diverticulitis. 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 Low-attenuation left para-aortic lymph nodes (image 32/2) are stable since 2017 consistent with benign etiology. No pelvic sidewall lymphadenopathy. Reproductive: The prostate gland and seminal vesicles are unremarkable. Other: No intraperitoneal free fluid. Musculoskeletal: Multiple supraumbilical ventral hernias identified, containing only fat. Stable bone island left anterior pubic ramus. No worrisome lytic or sclerotic osseous abnormality. IMPRESSION: 1. No acute findings in the abdomen or pelvis. Specifically, no findings to suggests retroperitoneal hemorrhage. 2. 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 3. No change in the left para-aortic lymphadenopathy consistent with benign/reactive etiology. 4. Cholelithiasis. 5. Multiple supraumbilical ventral hernias containing only fat. Electronically Signed   By: Misty Stanley M.D.   On: 04/04/2021 07:36   DG Chest 2 View  Result Date: 04/27/2021 CLINICAL DATA:  Chest pain. EXAM: CHEST - 2 VIEW COMPARISON:  None. FINDINGS: Stable position of cardiac pacemaker.  Aortic stent graft noted. Tortuosity and calcific atherosclerotic disease of the aorta. Cardiomediastinal silhouette is normal. Mediastinal contours appear intact. There is no evidence of focal airspace consolidation, pleural effusion  or pneumothorax. Probable bilateral small pleural effusions. Osseous structures are without acute abnormality. Soft tissues are grossly normal. IMPRESSION: Probable bilateral small pleural effusions. Electronically Signed   By: Fidela Salisbury M.D.   On: 04/27/2021 13:45   DG Chest 2 View  Result Date: 04/11/2021 CLINICAL DATA:  Shortness of breath EXAM: CHEST - 2 VIEW COMPARISON:  04/03/2021 FINDINGS: No focal consolidation. No pleural effusion or pneumothorax. Heart and mediastinal contours are unremarkable. Dual lead cardiac pacemaker. No acute osseous abnormality. IMPRESSION: No active cardiopulmonary disease. Electronically Signed   By: Kathreen Devoid M.D.   On: 04/11/2021 15:11   DG Chest 2 View  Result Date: 04/03/2021 CLINICAL DATA:  sob EXAM: CHEST - 2 VIEW COMPARISON:  Chest x-ray 09/07/2016, CT chest 12/14/2011. FINDINGS: The heart and mediastinal contours are unchanged. Coronary artery stent. Aortic calcification. The distal descending thoracic aorta/suprarenal abdominal aorta stent noted on lateral view. Three lead cardiac pacemaker/defibrillator overlies the left chest wall. No focal consolidation. No pulmonary edema. Blunting of bilateral costophrenic angles. No pneumothorax. No acute osseous abnormality. IMPRESSION: Blunting of bilateral costophrenic angles. Trace pleural effusions not excluded. Electronically Signed   By: Iven Finn M.D.   On: 04/03/2021 22:56   CT CHEST WO CONTRAST  Result Date: 04/12/2021 CLINICAL DATA:  Shortness of breath. EXAM: CT CHEST WITHOUT CONTRAST TECHNIQUE: Multidetector CT imaging of the chest was performed following the standard protocol without IV contrast. COMPARISON:  December 14, 2011 FINDINGS: Cardiovascular: A dual lead AICD is in place. There is moderate to marked severity calcification of the aortic arch and descending thoracic aorta. 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 A stent is noted within the distal aspect of  the descending thoracic aorta. This extends to include the visualized portion of the abdominal aorta. Normal heart size with coronary artery stents in place. No pericardial effusion. Mediastinum/Nodes: There is mild AP window and pretracheal lymphadenopathy. Thyroid gland, trachea, and esophagus demonstrate no significant findings. Lungs/Pleura: There is mild emphysematous lung disease involving the bilateral upper lobes. Mild areas of linear scarring and/or atelectasis are noted within the posterior aspects of the bilateral lung bases. There are very small bilateral pleural effusions. No pneumothorax is identified. Upper Abdomen: A thin layer of tiny gallstones is seen within the dependent portion of the gallbladder. Vascular stents are seen within the origins of the celiac artery, superior mesenteric artery and bilateral renal arteries. Musculoskeletal: Multilevel degenerative changes seen throughout the thoracic spine. IMPRESSION: 1. Mild emphysematous lung disease. 2. Mild bibasilar linear scarring and/or atelectasis. 3. Very small bilateral pleural effusions. 4. Extensive stenting of the distal descending thoracic aorta, as well as the abdominal aorta and several of its branches. 5. Cholelithiasis. Aortic Atherosclerosis (ICD10-I70.0) and Emphysema (ICD10-J43.9). Electronically Signed   By: Virgina Norfolk M.D.   On: 04/12/2021 04:11   NM Pulmonary Perfusion  Result Date: 04/12/2021 CLINICAL DATA:  Evaluate for pulmonary embolus. Shortness of breath. EXAM: NUCLEAR MEDICINE PERFUSION LUNG SCAN TECHNIQUE: Perfusion images were obtained in multiple projections after intravenous injection of radiopharmaceutical. Ventilation scans intentionally deferred if perfusion scan and chest x-ray adequate for interpretation during COVID 19 epidemic. RADIOPHARMACEUTICALS:  4.3 mCi Tc-67m MAA IV COMPARISON:  Chest radiograph 04/11/21 FINDINGS: On the chest radiograph from 04/11/2021 there is a ICD overlying the left  chest wall. No focal pulmonary opacities identified. On the perfusion portion of the examination there is a focal peripheral defect overlying the left upper lobe corresponding to the ICD battery pack. Mild heterogeneous distribution of the radiopharmaceutical is noted within both lungs. On the lateral projection images there are large nonsegmental areas of diminished activity within both posterior and upper lung zones which likely represents non-uniform soft tissue attenuation artifact secondary to patient's upper extremities. No peripheral segmental perfusion defects to suggest acute pulmonary embolus. IMPRESSION: 1. No evidence for acute pulmonary embolus. Electronically Signed   By: Kerby Moors M.D.   On: 04/12/2021 11:01   US RENAL  Result Date: 04/08/2021 CLINICAL DATA:  Renal dysfunction. EXAM: RENAL / URINARY TRACT ULTRASOUND COMPLETE COMPARISON:  Abdominal sonogram done on 06/20/2015 FINDINGS: Right Kidney: Renal measurements: 10.9 x 5.3 x 5 cm = volume: 152 mL. There is no hydronephrosis. There is increased cortical echogenicity.  Left Kidney: Renal measurements: 11.2 x 5.5 x 4.5 cm = volume: 147 mL. There is no hydronephrosis. There is increased cortical echogenicity. There is 2 cm cyst in the upper pole. Bladder: Appears normal for degree of bladder distention. Other: None. IMPRESSION: There is no hydronephrosis. Increased cortical echogenicity suggests medical renal disease. 2 cm left renal cyst. Electronically Signed   By: Elmer Picker M.D.   On: 04/08/2021 16:11   US Venous Img Lower Bilateral  Result Date: 04/04/2021 CLINICAL DATA:  Bilateral lower extremity edema, dyspnea EXAM: BILATERAL LOWER EXTREMITY VENOUS DOPPLER ULTRASOUND TECHNIQUE: Gray-scale sonography with compression, as well as color and duplex ultrasound, were performed to evaluate the deep venous system(s) from the level of the common femoral vein through the popliteal and proximal calf veins. COMPARISON:  None.  FINDINGS: VENOUS Normal compressibility of the common femoral, superficial femoral, and popliteal veins, as well as the visualized calf veins. Visualized portions of profunda femoral vein and great saphenous vein unremarkable. No filling defects to suggest DVT on grayscale or color Doppler imaging. Doppler waveforms show normal direction of venous flow, normal respiratory plasticity and response to augmentation. OTHER None. Limitations: none IMPRESSION: Negative. Electronically Signed   By: Fidela Salisbury M.D.   On: 04/04/2021 03:32   US Venous Img Upper Uni Left  Result Date: 04/11/2021 CLINICAL DATA:  Left upper extremity pain and swelling. EXAM: LEFT UPPER EXTREMITY VENOUS DOPPLER ULTRASOUND TECHNIQUE: Gray-scale sonography with graded compression, as well as color Doppler and duplex ultrasound were performed to evaluate the upper extremity deep venous system from the level of the subclavian vein and including the jugular, axillary, basilic, radial, ulnar and upper cephalic vein. Spectral Doppler was utilized to evaluate flow at rest and with distal augmentation maneuvers. COMPARISON:  None. FINDINGS: Contralateral Subclavian Vein: Respiratory phasicity is normal and symmetric with the symptomatic side. No evidence of thrombus. Normal compressibility. Internal Jugular Vein: No evidence of thrombus. Normal compressibility, respiratory phasicity and response to augmentation. Subclavian Vein: No evidence of thrombus. Normal compressibility, respiratory phasicity and response to augmentation. Axillary Vein: No evidence of thrombus. Normal compressibility, respiratory phasicity and response to augmentation. Cephalic Vein: No evidence of thrombus. Normal compressibility, respiratory phasicity and response to augmentation. Basilic Vein: No evidence of thrombus. Normal compressibility, respiratory phasicity and response to augmentation. Brachial Veins: No evidence of thrombus. Normal compressibility, respiratory  phasicity and response to augmentation. Radial Veins: No evidence of thrombus. Normal compressibility, respiratory phasicity and response to augmentation. Ulnar Veins: No evidence of thrombus. Normal compressibility, respiratory phasicity and response to augmentation. Venous Reflux:  None visualized. Other Findings:  None visualized. IMPRESSION: No evidence of DVT within the LEFT upper extremity. Electronically Signed   By: Virgina Norfolk M.D.   On: 04/11/2021 20:25   ECHOCARDIOGRAM COMPLETE  Result Date: 04/06/2021    ECHOCARDIOGRAM REPORT   Patient Name:   ROLLAND STEINERT Date of Exam: 04/04/2021 Medical Rec #:  [id number]        Height:       70.0 in Accession #:    [id number]       Weight:       220.0 lb Date of Birth:  1940-12-22       BSA:          2.174 m Patient Age:    78 years         BP:           198/65 mmHg Patient Gender: M  HR:           89 bpm. Exam Location:  [facility] Procedure: 2D Echo, Cardiac Doppler and Color Doppler Indications:     Elevated Troponin  History:         Patient has prior history of Echocardiogram examinations, most                  recent 08/08/2016. CHF, Previous Myocardial Infarction; Risk                  Factors:Hypertension.  Sonographer:     Sherrie Sport Referring Phys:  [id number] GEZMOQHU AGBATA Diagnosing Phys: Yolonda Kida MD  Sonographer Comments: Suboptimal parasternal window. IMPRESSIONS  1. Left ventricular ejection fraction, by estimation, is 50 to 55%. The left ventricle has low normal function. The left ventricle has no regional wall motion abnormalities. The left ventricular internal cavity size was mildly dilated. There is mild left ventricular hypertrophy. Left ventricular diastolic parameters are consistent with Grade II diastolic dysfunction (pseudonormalization).  2. Right ventricular systolic function is low normal. The right ventricular size is moderately enlarged.  3. The mitral valve is normal in structure. Mild mitral valve  regurgitation.  4. The aortic valve is normal in structure. Aortic valve regurgitation is mild. FINDINGS  Left Ventricle: Left ventricular ejection fraction, by estimation, is 50 to 55%. The left ventricle has low normal function. The left ventricle has no regional wall motion abnormalities. The left ventricular internal cavity size was mildly dilated. There is mild left ventricular hypertrophy. Left ventricular diastolic parameters are consistent with Grade II diastolic dysfunction (pseudonormalization). Right Ventricle: The right ventricular size is moderately enlarged. No increase in right ventricular wall thickness. Right ventricular systolic function is low normal. Left Atrium: Left atrial size was normal in size. Right Atrium: Right atrial size was normal in size. Pericardium: There is no evidence of pericardial effusion. Mitral Valve: The mitral valve is normal in structure. Mild mitral valve regurgitation. Tricuspid Valve: The tricuspid valve is normal in structure. Tricuspid valve regurgitation is mild. Aortic Valve: The aortic valve is normal in structure. Aortic valve regurgitation is mild. Aortic valve mean gradient measures 4.0 mmHg. Aortic valve peak gradient measures 6.7 mmHg. Aortic valve area, by VTI measures 3.09 cm. Pulmonic Valve: The pulmonic valve was normal in structure. Pulmonic valve regurgitation is not visualized. Aorta: The ascending aorta was not well visualized. IAS/Shunts: No atrial level shunt detected by color flow Doppler. Additional Comments: A device lead is visualized. There is no pleural effusion.  LEFT VENTRICLE PLAX 2D LVIDd:         4.90 cm   Diastology LVIDs:         3.30 cm   LV e' medial:    7.07 cm/s LV PW:         1.50 cm   LV E/e' medial:  14.0 LV IVS:        1.05 cm   LV e' lateral:   8.27 cm/s LVOT diam:     2.10 cm   LV E/e' lateral: 12.0 LV SV:         86 LV SV Index:   40 LVOT Area:     3.46 cm  RIGHT VENTRICLE RV Basal diam:  4.40 cm RV S prime:     14.40 cm/s  TAPSE (M-mode): 4.7 cm LEFT ATRIUM           Index        RIGHT ATRIUM  Index LA diam:      2.90 cm 1.33 cm/m   RA Area:     27.00 cm LA Vol (A2C): 45.6 ml 20.98 ml/m  RA Volume:   85.90 ml  39.52 ml/m LA Vol (A4C): 56.1 ml 25.81 ml/m  AORTIC VALVE                    PULMONIC VALVE AV Area (Vmax):    3.01 cm     PV Vmax:        0.70 m/s AV Area (Vmean):   3.08 cm     PV Vmean:       44.100 cm/s AV Area (VTI):     3.09 cm     PV VTI:         0.107 m AV Vmax:           129.00 cm/s  PV Peak grad:   1.9 mmHg AV Vmean:          88.400 cm/s  PV Mean grad:   1.0 mmHg AV VTI:            0.279 m      RVOT Peak grad: 3 mmHg AV Peak Grad:      6.7 mmHg AV Mean Grad:      4.0 mmHg LVOT Vmax:         112.00 cm/s LVOT Vmean:        78.700 cm/s LVOT VTI:          0.249 m LVOT/AV VTI ratio: 0.89  AORTA Ao Root diam: 3.40 cm MITRAL VALVE               TRICUSPID VALVE MV Area (PHT): 4.83 cm    TR Peak grad:   40.4 mmHg MV Decel Time: 157 msec    TR Vmax:        318.00 cm/s MV E velocity: 99.00 cm/s MV A velocity: 87.00 cm/s  SHUNTS MV E/A ratio:  1.14        Systemic VTI:  0.25 m                            Systemic Diam: 2.10 cm                            Pulmonic VTI:  0.149 m Yolonda Kida MD Electronically signed by Yolonda Kida MD Signature Date/Time: 04/06/2021/11:32:38 AM    Final     ECHO 04/04/2021 LVEF 50-55% without wall motion abnormalities, mild LV dilation, mild LVH, grade 2 diastolic dysfunction, mild MR, mild AR  TELEMETRY reviewed by me: NSR LBBB PVCs rate 80  EKG reviewed by me: Normal sinus rhythm, LBBB, multiform PVCs rate 78  ASSESSMENT AND PLAN:  The patient is an 81 year old male with a past medical history significant for HFpEF (LVEF 50-55%) 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  Cardiology is consulted for management of his heart failure.  #Acute on chronic HFpEF (LVEF 50-55%) / ICM s/p ICD placement 09/2017 #PVCs #Elevated troponin #AKI on CKD 4 The patient was recently hospitalized from 12/22 - 12/28 and finished his rehab stay on the day of admission 1/15. He was not started back on his lasix  at discharge due to an AKI and presents to the hospital today slightly volume overloaded and SOB. The pt states he feels like not being on supplemental oxygen is what is making him SOB. Additionally, the pt's blood pressure was severely elevated on admission and troponins are persistently elevated too likely as a result. He denies chest pain.  -S/p IV Lasix 40 mg x 2 with good UOP. Continue 40mg  IV lasix for now and will reassess tomorrow morning.  -Agree with monitoring and replenishing electrolytes as needed due to patient's PVCs.  On admission mag is 1.8, potassium 3.5. -The patient will need further evaluation of sodium intake due to discussion that there was significant amount of changes in the recent weeks  #Hypertensive urgency  #CAD s/p DES x3 Agree with hydralazine 100 mg every 8 hours until blood pressure is at goal Home medication regimen includes amlodipine 5 mg, carvedilol 6.25 mg once daily  #Dyslipidemia Continue high intensity statin Lipitor 40 mg once daily  This patient's plan of care was discussed and created with Dr. Serafina Royals and he is in agreement.  Signed: Tristan Schroeder , PA-C 04/28/2021, 7:55 AM   The patient has been interviewed and examined. I agree with assessment and plan above. Serafina Royals MD [facility]

## 2021-04-28 NOTE — ED Notes (Signed)
Admit MD Regalado messaged regarding pt's O2 while ambulating.

## 2021-04-28 NOTE — Progress Notes (Addendum)
PROGRESS NOTE    Peter [name]  KDX:[id number] DOB: 1940-07-01 DOA: 04/27/2021 PCP: Tracie Harrier, MD   Brief Narrative: 81 year old with past medical history significant for systolic and diastolic heart failure, CAD, hypertension, dyslipidemia, non-STEMI status post PCI on steroids, status post AICD who presents to the ED with acute onset of worsening shortness of breath.  He describes orthopnea, paroxysmal nocturnal dyspnea, exertional dyspnea.  Recent admission 12/22 until 12/28 for symptomatic anemia, received blood transfusion, treatment for heart failure exacerbation with diuresis.  His Lasix was discontinued at discharge due to AKI on CKD stage IV.  He has not been taking Lasix since her last hospitalization.  AICD was interrogated by ER physician and show increased PVCs.  Evaluation in the ED patient was noted to be tensive systolic blood pressure 976, tachypneic respiration rate 25, BUN 40, creatinine 2.8 prior creatinine 3.2 on 9/9.  Last admission creatinine at discharge was 3.1.  BNP elevated 2988.  Troponin mildly elevated to 253--- 259. Chest x-ray showed bilateral small pleural effusion.  Patient admitted for further evaluation and treatment of heart failure exacerbation.    Assessment & Plan:   Principal Problem:   Acute exacerbation of CHF (congestive heart failure) (HCC)  1-Acute on chronic diastolic heart failure exacerbation: His systolic heart failure has resolved since 2D echo 04/04/2021.  Last ejection fraction 50 to 55%. -Continue with IV Lasix. -Hold spironolactone and ACE due to CKD. -I have consulted nephrology as well to assist with diuresis in the setting of CKD stage IV -Patient with cardiology assistance Negative 751. Patient will likely require home oxygen.  2-Hypertensive urgency:  Continue with Coreg, hydralazine.  Will increase Imdur dose. Will hold Cozaar and Aldactone due to CKD stage IV for now. Start Norvasc.   3-Elevation of troponin:  In the setting of heart failure exacerbation.  Likely demand ischemia. Continue Imdur Coreg. He was a started on a baby aspirin. Neurology consulted and following.  4-AKI on CKD stage IV;  Baseline cr 2.8 Concern for cardiorenal syndrome.  Continue with IV lasix.  Nephrology consulted.   Multifocal PVCs: Status post AICD.  Suspicious AICD discharge night of admission. Cardiology consulted and following Replete electrolytes, will give 40 meq KCL.  Received IV magnesium   5-Hyperlipidemia: Continue with a statin  6-BPH: Continue with Proscar and Flomax 7--Mild emphysematous lung disease, seen on CT scan last admission, He was Supposed to have appointment office today with pulmonologist.  DuoNebs. Dr. Keenan Bachelor  will see patient in consultation today.     Estimated body mass index is 28.59 kg/m as calculated from the following:   Height as of this encounter: 5\' 11"  (1.803 m).   Weight as of this encounter: 93 kg.   DVT prophylaxis: Lovenox Code Status: Full code Family Communication: Care discussed with patient, significant other updated at bedside 1/16. Disposition Plan:  Status is: Inpatient  Remains inpatient appropriate because: Patient need admission for IV diuresis due to heart failure exacerbation, AKI secondary to cardiorenal syndrome.        Consultants:  Cardiology   Procedures:  none  Antimicrobials:    Subjective: Or shortness of breath, report feeling better on oxygen.  Objective: Vitals:   04/28/21 0330 04/28/21 0515 04/28/21 0607 04/28/21 0700  BP: (!) 175/69 (!) 177/69 (!) 173/74 (!) 171/70  Pulse: 69 66 64 62  Resp: 17 18 16  (!) 27  Temp:      TempSrc:      SpO2: 98% 96% 97% 98%  Weight:  Height:        Intake/Output Summary (Last 24 hours) at 04/28/2021 0807 Last data filed at 04/28/2021 4944 Gross per 24 hour  Intake 48.42 ml  Output 400 ml  Net -351.58 ml   Filed Weights   04/27/21 1300  Weight: 93 kg     Examination:  General exam: Appears calm and comfortable  Respiratory system: BL crackles.  Cardiovascular system: S1 & S2 heard, RRR. Positive JVD Gastrointestinal system: Abdomen is nondistended, soft and nontender. No organomegaly or masses felt. Normal bowel sounds heard. Central nervous system: Alert and oriented. No focal neurological deficits. Extremities: Symmetric 5 x 5 power.     Data Reviewed: I have personally reviewed following labs and imaging studies  CBC: Recent Labs  Lab 04/21/21 2219 04/27/21 1305 04/27/21 2246  WBC 6.1 7.0 6.3  HGB 10.6* 11.4* 10.9*  HCT 33.4* 35.0* 33.1*  MCV 92.0 89.7 90.9  PLT 161 168 967*   Basic Metabolic Panel: Recent Labs  Lab 04/21/21 2219 04/27/21 1305 04/27/21 2246 04/28/21 0611  NA 139 138  --  142  K 4.1 4.0   3.9  --  3.5  CL 107 108  --  109  CO2 26 24  --  24  GLUCOSE 120* 114*  --  87  BUN 47* 40*  --  44*  CREATININE 3.26* 2.83* 3.10* 3.04*  CALCIUM 8.3* 8.2*  --  8.1*  MG  --  1.8  --   --    GFR: Estimated Creatinine Clearance: 22.2 mL/min (A) (by C-G formula based on SCr of 3.04 mg/dL (H)). Liver Function Tests: No results for input(s): AST, ALT, ALKPHOS, BILITOT, PROT, ALBUMIN in the last 168 hours. No results for input(s): LIPASE, AMYLASE in the last 168 hours. No results for input(s): AMMONIA in the last 168 hours. Coagulation Profile: No results for input(s): INR, PROTIME in the last 168 hours. Cardiac Enzymes: No results for input(s): CKTOTAL, CKMB, CKMBINDEX, TROPONINI in the last 168 hours. BNP (last 3 results) No results for input(s): PROBNP in the last 8760 hours. HbA1C: No results for input(s): HGBA1C in the last 72 hours. CBG: No results for input(s): GLUCAP in the last 168 hours. Lipid Profile: No results for input(s): CHOL, HDL, LDLCALC, TRIG, CHOLHDL, LDLDIRECT in the last 72 hours. Thyroid Function Tests: No results for input(s): TSH, T4TOTAL, FREET4, T3FREE, THYROIDAB in the  last 72 hours. Anemia Panel: No results for input(s): VITAMINB12, FOLATE, FERRITIN, TIBC, IRON, RETICCTPCT in the last 72 hours. Sepsis Labs: No results for input(s): PROCALCITON, LATICACIDVEN in the last 168 hours.  Recent Results (from the past 240 hour(s))  Resp Panel by RT-PCR (Flu A&B, Covid) Nasopharyngeal Swab     Status: None   Collection Time: 04/22/21  2:26 AM   Specimen: Nasopharyngeal Swab; Nasopharyngeal(NP) swabs in vial transport medium  Result Value Ref Range Status   SARS Coronavirus 2 by RT PCR NEGATIVE NEGATIVE Final    Comment: (NOTE) SARS-CoV-2 target nucleic acids are NOT DETECTED.  The SARS-CoV-2 RNA is generally detectable in upper respiratory specimens during the acute phase of infection. The lowest concentration of SARS-CoV-2 viral copies this assay can detect is 138 copies/mL. A negative result does not preclude SARS-Cov-2 infection and should not be used as the sole basis for treatment or other patient management decisions. A negative result may occur with  improper specimen collection/handling, submission of specimen other than nasopharyngeal swab, presence of viral mutation(s) within the areas targeted by this assay, and inadequate  number of viral copies(<138 copies/mL). A negative result must be combined with clinical observations, patient history, and epidemiological information. The expected result is Negative.  Fact Sheet for Patients:  EntrepreneurPulse.com.au  Fact Sheet for Healthcare Providers:  IncredibleEmployment.be  This test is no t yet approved or cleared by the [place] FDA and  has been authorized for detection and/or diagnosis of SARS-CoV-2 by FDA under an Emergency Use Authorization (EUA). This EUA will remain  in effect (meaning this test can be used) for the duration of the COVID-19 declaration under Section 564(b)(1) of the Act, 21 U.S.C.section 360bbb-3(b)(1), unless the authorization is  terminated  or revoked sooner.       Influenza A by PCR NEGATIVE NEGATIVE Final   Influenza B by PCR NEGATIVE NEGATIVE Final    Comment: (NOTE) The Xpert Xpress SARS-CoV-2/FLU/RSV plus assay is intended as an aid in the diagnosis of influenza from Nasopharyngeal swab specimens and should not be used as a sole basis for treatment. Nasal washings and aspirates are unacceptable for Xpert Xpress SARS-CoV-2/FLU/RSV testing.  Fact Sheet for Patients: EntrepreneurPulse.com.au  Fact Sheet for Healthcare Providers: IncredibleEmployment.be  This test is not yet approved or cleared by the [place] FDA and has been authorized for detection and/or diagnosis of SARS-CoV-2 by FDA under an Emergency Use Authorization (EUA). This EUA will remain in effect (meaning this test can be used) for the duration of the COVID-19 declaration under Section 564(b)(1) of the Act, 21 U.S.C. section 360bbb-3(b)(1), unless the authorization is terminated or revoked.  Performed at [facility], [address] [place], [place] [address]   Resp Panel by RT-PCR (Flu A&B, Covid) Nasopharyngeal Swab     Status: None   Collection Time: 04/27/21  6:30 PM   Specimen: Nasopharyngeal Swab; Nasopharyngeal(NP) swabs in vial transport medium  Result Value Ref Range Status   SARS Coronavirus 2 by RT PCR NEGATIVE NEGATIVE Final    Comment: (NOTE) SARS-CoV-2 target nucleic acids are NOT DETECTED.  The SARS-CoV-2 RNA is generally detectable in upper respiratory specimens during the acute phase of infection. The lowest concentration of SARS-CoV-2 viral copies this assay can detect is 138 copies/mL. A negative result does not preclude SARS-Cov-2 infection and should not be used as the sole basis for treatment or other patient management decisions. A negative result may occur with  improper specimen collection/handling, submission of specimen other than nasopharyngeal swab,  presence of viral mutation(s) within the areas targeted by this assay, and inadequate number of viral copies(<138 copies/mL). A negative result must be combined with clinical observations, patient history, and epidemiological information. The expected result is Negative.  Fact Sheet for Patients:  EntrepreneurPulse.com.au  Fact Sheet for Healthcare Providers:  IncredibleEmployment.be  This test is no t yet approved or cleared by the [place] FDA and  has been authorized for detection and/or diagnosis of SARS-CoV-2 by FDA under an Emergency Use Authorization (EUA). This EUA will remain  in effect (meaning this test can be used) for the duration of the COVID-19 declaration under Section 564(b)(1) of the Act, 21 U.S.C.section 360bbb-3(b)(1), unless the authorization is terminated  or revoked sooner.       Influenza A by PCR NEGATIVE NEGATIVE Final   Influenza B by PCR NEGATIVE NEGATIVE Final    Comment: (NOTE) The Xpert Xpress SARS-CoV-2/FLU/RSV plus assay is intended as an aid in the diagnosis of influenza from Nasopharyngeal swab specimens and should not be used as a sole basis for treatment. Nasal washings and aspirates are  unacceptable for Xpert Xpress SARS-CoV-2/FLU/RSV testing.  Fact Sheet for Patients: EntrepreneurPulse.com.au  Fact Sheet for Healthcare Providers: IncredibleEmployment.be  This test is not yet approved or cleared by the [place] FDA and has been authorized for detection and/or diagnosis of SARS-CoV-2 by FDA under an Emergency Use Authorization (EUA). This EUA will remain in effect (meaning this test can be used) for the duration of the COVID-19 declaration under Section 564(b)(1) of the Act, 21 U.S.C. section 360bbb-3(b)(1), unless the authorization is terminated or revoked.  Performed at [facility], [address] [place], [place] [address]           Radiology Studies: DG Chest 2 View  Result Date: 04/27/2021 CLINICAL DATA:  Chest pain. EXAM: CHEST - 2 VIEW COMPARISON:  None. FINDINGS: Stable position of cardiac pacemaker.  Aortic stent graft noted. Tortuosity and calcific atherosclerotic disease of the aorta. Cardiomediastinal silhouette is normal. Mediastinal contours appear intact. There is no evidence of focal airspace consolidation, pleural effusion or pneumothorax. Probable bilateral small pleural effusions. Osseous structures are without acute abnormality. Soft tissues are grossly normal. IMPRESSION: Probable bilateral small pleural effusions. Electronically Signed   By: Fidela Salisbury M.D.   On: 04/27/2021 13:45        Scheduled Meds:  amLODipine  5 mg Oral Daily   atorvastatin  40 mg Oral q1800   carvedilol  6.25 mg Oral BID WC   clobetasol cream   Topical BID   clopidogrel  75 mg Oral Daily   enoxaparin (LOVENOX) injection  30 mg Subcutaneous Q24H   finasteride  5 mg Oral Daily   furosemide  40 mg Intravenous Q12H   hydrALAZINE  100 mg Oral Q8H   isosorbide mononitrate  60 mg Oral Daily   loratadine  10 mg Oral Daily   pantoprazole  40 mg Oral Daily   potassium chloride  40 mEq Oral Once   sodium chloride flush  3 mL Intravenous Q12H   tamsulosin  0.4 mg Oral Daily   cyanocobalamin  1,000 mcg Oral Daily   Continuous Infusions:  sodium chloride       LOS: 1 day    Time spent: 35 minutes    Iwalani Templeton A Delonte Musich, MD [facility]   If 7PM-7AM, please contact night-coverage [email or web address]  04/28/2021, 8:07 AM

## 2021-04-28 NOTE — ED Notes (Signed)
Pt advised RN that his nasal passages are getting dry from O2. RN placed humidifier on pt Peter [name].

## 2021-04-28 NOTE — ED Notes (Signed)
Pt resting at this time. Pt denies any current needs. Family at bedside

## 2021-04-28 NOTE — NC FL2 (Signed)
[place] LEVEL OF CARE SCREENING TOOL     IDENTIFICATION  Patient Name: Peter [name] Birthdate: 1941/01/07 Sex: male Admission Date (Current Location): 04/27/2021  [facility] and [place] Number:  Engineering geologist and Address:  [facility], [address], [place], [place] [address]      Provider Number: [id number]  Attending Physician Name and Address:  Elmarie Shiley, MD  Relative Name and Phone Number:  Julian Reil- daughter [phone]    Current Level of Care: Hospital Recommended Level of Care: [place] Prior Approval Number:    Date Approved/Denied:   PASRR Number: [id number]  Discharge Plan: SNF    Current Diagnoses: Patient Active Problem List   Diagnosis Date Noted   Acute exacerbation of CHF (congestive heart failure) ([place]) 04/27/2021   CKD (chronic kidney disease) stage 4, GFR 15-29 ml/min ([place]) 04/04/2021   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   S/P AAA (abdominal aortic aneurysm) repair 04/04/2021   Acute anemia 04/04/2021   Thrombocytopenia ([place]) 04/04/2021   ABLA (acute blood loss anemia) 04/04/2021   AKI (acute kidney injury) ([place]) 04/04/2021   Symptomatic anemia    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 12/19/2018   CKD (chronic kidney disease) stage 3, GFR 30-59 ml/min (HCC) 05/18/2018   Elevated hemoglobin A1c 05/18/2018   History of skin cancer 05/18/2018   Ischemic cardiomyopathy 12/16/2017   S/P ICD (internal cardiac defibrillator) procedure 12/16/2017   Acute on chronic systolic CHF (congestive heart failure) ([place]) 09/07/2016   Congestive heart failure ([place]) 01/17/2015   Current tobacco use 01/17/2015   Hypercholesterolemia 01/17/2015   Peripheral vascular disease ([place]) 01/17/2015   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   Arthritis, degenerative 07/22/2013   H/O angina pectoris 07/22/2013   Adiposity 07/22/2013   Benign  prostatic hyperplasia with urinary obstruction 11/06/2012   Hypokalemia 12/15/2011   SOB (shortness of breath) 12/14/2011   CAD (coronary artery disease) 12/14/2011   HTN (hypertension) 12/14/2011   Hyperlipidemia 12/14/2011   GERD (gastroesophageal reflux disease) 12/14/2011   BPH (benign prostatic hyperplasia) 12/14/2011   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/14/2011   Aneurysm of thoracic aorta 12/14/2011    Orientation RESPIRATION BLADDER Height & Weight     Self, Time, Situation, Place  O2 ([place] 2L) Continent Weight: 93 kg Height:  5\' 11"  (180.3 cm)  BEHAVIORAL SYMPTOMS/MOOD NEUROLOGICAL BOWEL NUTRITION STATUS      Continent Diet (Heart Healthy)  AMBULATORY STATUS COMMUNICATION OF NEEDS Skin   Limited Assist Verbally Normal                       Personal Care Assistance Level of Assistance  Bathing, Feeding, Dressing Bathing Assistance: Limited assistance Feeding assistance: Independent Dressing Assistance: Limited assistance     Functional Limitations Info  Hearing   Hearing Info: Impaired      SPECIAL CARE FACTORS FREQUENCY  PT (By licensed PT), OT (By licensed OT)     PT Frequency: 5 times per week OT Frequency: 5 times per week            Contractures Contractures Info: Not present    Additional Factors Info  Code Status, Allergies Code Status Info: Full Allergies Info: Iodinated Contrast medica, iodenated Glycerol           Current Medications (04/28/2021):  This is the current hospital active medication list Current Facility-Administered Medications  Medication Dose Route Frequency Provider Last Rate Last Admin   0.9 %  sodium chloride infusion  250 mL Intravenous PRN Mansy, Jan A, MD       acetaminophen (TYLENOL) tablet 650 mg  650 mg Oral Q4H PRN Mansy, Arvella Merles, MD       ALPRAZolam Duanne Moron) tablet 0.25 mg  0.25 mg Oral BID PRN Mansy, Jan A, MD       amLODipine (NORVASC) tablet 10 mg  10 mg Oral Daily Regalado, Belkys A, MD   10 mg at 04/28/21 0903    atorvastatin (LIPITOR) tablet 40 mg  40 mg Oral q1800 Mansy, Jan A, MD       carvedilol (COREG) tablet 6.25 mg  6.25 mg Oral BID WC Mansy, Jan A, MD   6.25 mg at 04/28/21 0904   clobetasol cream (TEMOVATE) 0.05 %   Topical BID Mansy, Jan A, MD       clopidogrel (PLAVIX) tablet 75 mg  75 mg Oral Daily Mansy, Jan A, MD   75 mg at 04/28/21 0904   enoxaparin (LOVENOX) injection 30 mg  30 mg Subcutaneous Q24H Mansy, Jan A, MD   30 mg at 04/27/21 2300   finasteride (PROSCAR) tablet 5 mg  5 mg Oral Daily Mansy, Jan A, MD   5 mg at 04/28/21 0904   furosemide (LASIX) injection 40 mg  40 mg Intravenous Q12H Mansy, Jan A, MD   40 mg at 04/28/21 0610   hydrALAZINE (APRESOLINE) tablet 100 mg  100 mg Oral Q8H Mansy, Jan A, MD   100 mg at 04/28/21 0610   isosorbide mononitrate (IMDUR) 24 hr tablet 60 mg  60 mg Oral Daily Regalado, Belkys A, MD   60 mg at 04/28/21 0904   labetalol (NORMODYNE) injection 20 mg  20 mg Intravenous Q3H PRN Mansy, Jan A, MD   20 mg at 04/27/21 2117   loratadine (CLARITIN) tablet 10 mg  10 mg Oral Daily Mansy, Jan A, MD   10 mg at 04/28/21 0905   nitroGLYCERIN (NITROGLYN) 2 % ointment 1 inch  1 inch Topical Q6H PRN Mansy, Jan A, MD   1 inch at 04/27/21 2300   ondansetron (ZOFRAN) injection 4 mg  4 mg Intravenous Q6H PRN Mansy, Jan A, MD       pantoprazole (PROTONIX) EC tablet 40 mg  40 mg Oral Daily Mansy, Jan A, MD   40 mg at 04/28/21 0904   potassium chloride (KLOR-CON) packet 40 mEq  40 mEq Oral Once Mansy, Jan A, MD       sodium chloride flush (NS) 0.9 % injection 3 mL  3 mL Intravenous Q12H Mansy, Jan A, MD   3 mL at 04/28/21 0907   sodium chloride flush (NS) 0.9 % injection 3 mL  3 mL Intravenous PRN Mansy, Jan A, MD       tamsulosin [facility]) capsule 0.4 mg  0.4 mg Oral Daily Mansy, Jan A, MD   0.4 mg at 04/28/21 0454   vitamin B-12 (CYANOCOBALAMIN) tablet 1,000 mcg  1,000 mcg Oral Daily Mansy, Jan A, MD   1,000 mcg at 04/28/21 0904   Current Outpatient Medications  Medication  Sig Dispense Refill   atorvastatin (LIPITOR) 40 MG tablet Take 1 tablet (40 mg total) by mouth daily at 6 PM. 30 tablet 0   carvedilol (COREG) 6.25 MG tablet Take 6.25 mg by mouth 2 (two) times daily with a meal.     cetirizine (ZYRTEC) 10 MG tablet Take 10 mg by mouth.     clobetasol cream (TEMOVATE) 0.05 % Apply 1  application topically 2 (two) times daily as needed (psoriasis).  1   clopidogrel (PLAVIX) 75 MG tablet TAKE 1 TABLET(75 MG) BY MOUTH EVERY DAY 30 tablet 1   cyanocobalamin 1000 MCG tablet Take 1,000 mcg by mouth daily.      finasteride (PROSCAR) 5 MG tablet Take 1 tablet (5 mg total) by mouth daily. 90 tablet 3   furosemide (LASIX) 20 MG tablet Hold this medication until followup with nephrology or cardiology. (Patient taking differently: Take 20 mg by mouth daily.) 30 tablet    GLUCOSAMINE-CHONDROITIN-VIT C PO Take 1 tablet by mouth daily.     hydrALAZINE (APRESOLINE) 100 MG tablet Take 1 tablet (100 mg total) by mouth every 8 (eight) hours. (Patient taking differently: Take 100 mg by mouth every 12 (twelve) hours.) 270 tablet 0   isosorbide mononitrate (IMDUR) 30 MG 24 hr tablet Take 30 mg by mouth daily.     omeprazole (PRILOSEC) 20 MG capsule Take 20 mg by mouth in the morning.     tamsulosin (FLOMAX) 0.4 MG CAPS capsule Take 1 capsule (0.4 mg total) by mouth daily. 90 capsule 3   calcitRIOL (ROCALTROL) 0.25 MCG capsule Take 0.25 mcg by mouth daily.     losartan (COZAAR) 100 MG tablet Hold until followup with your outpatient doctor due to worsening kidney function.     spironolactone (ALDACTONE) 25 MG tablet Hold until followup with your outpatient doctor due to worsening kidney function.       Discharge Medications: Please see discharge summary for a list of discharge medications.  Relevant Imaging Results:  Relevant Lab Results:   Additional Information SS#[id number]  Shelbie Hutching, RN

## 2021-04-28 NOTE — ED Notes (Signed)
Pt eating breakfast at this time.  

## 2021-04-28 NOTE — Evaluation (Signed)
Occupational Therapy Evaluation Patient Details Name: Peter [name] MRN: [id number] DOB: February 01, 1941 Today's Date: 04/28/2021   History of Present Illness KEIRON IODICE is a 81 y.o. Caucasian male with medical history significant for systolic and diastolic CHF, coronary artery disease, hypertension, dyslipidemia and coronary artery disease and non-STEMI status post PCI and stent, and status post AICD, who presented to the ER with acute onset of significantly worsening dyspnea today.   Clinical Impression   Mr. Raisanen presents with limited endurance and activity tolerance, as well as generalized weakness, that impacts his ability to safely and independently complete functional tasks.  Prior to admission, pt was able to complete basic ADLs independently, though endorses recent shortness of breath and limited energy to engage in meaningful occupations.  Currently, pt is able to complete BADLs with supervision assist and cueing from OT for energy conservation strategies and encouragement (as pt is anxious with movement).  Pt's SpO2 remained stable at 95-100% on 2L of O2 with activity.  OT provided education re: energy conservation strategies and discharge recommendations to pt and his daughter, in addition to compensatory strategies to promote safety and independence in ADLs.  Pt's daughter reports friends and family are able to provide support upon discharge as needed for IADLs, and are interested in pursuing assisted living environment in the future.  Mr. Gundrum will continue to benefit from skilled OT services in acute setting to support energy conservation, endurance, functional strengthening, and safety and independence in ADLs.  Recommend HHOT upon discharge.     Recommendations for follow up therapy are one component of a multi-disciplinary discharge planning process, led by the attending physician.  Recommendations may be updated based on patient status, additional functional criteria and  insurance authorization.   Follow Up Recommendations  Home health OT    Assistance Recommended at Discharge Intermittent Supervision/Assistance  Patient can return home with the following Assistance with cooking/housework;Assist for transportation    Functional Status Assessment  Patient has had a recent decline in their functional status and demonstrates the ability to make significant improvements in function in a reasonable and predictable amount of time.  Equipment Recommendations  BSC/3in1;Tub/shower seat    Recommendations for Other Services       Precautions / Restrictions Precautions Precautions: Fall Restrictions Weight Bearing Restrictions: No      Mobility Bed Mobility Overal bed mobility: Modified Independent               Patient Response: Cooperative;Anxious  Transfers Overall transfer level: Modified independent Equipment used: None Transfers: Sit to/from Stand Sit to Stand: Modified independent (Device/Increase time)           General transfer comment: with RW      Balance Overall balance assessment: Needs assistance Sitting-balance support: Feet supported Sitting balance-Leahy Scale: Good Sitting balance - Comments: steady static sitting, reaching within BOS.     Standing balance-Leahy Scale: Good Standing balance comment: Steady static standing without UE support.                           ADL either performed or assessed with clinical judgement   ADL Overall ADL's : Needs assistance/impaired                                       General ADL Comments: Pt is functionally limited by generalized weakness, decreased activity tolerance, and  cardiopulmonary status. He requires close supervision cues for impelmentation of energy conservation techniques during functional mobility and to doff/don bilat shoes and socks this date.  Pt demonstrated ability to complete basic ADLs with supervision assist.  Anticipate  increased assist for more exertional IADL Management and showering.     Vision Baseline Vision/History: 1 Wears glasses Patient Visual Report: No change from baseline       Perception     Praxis      Pertinent Vitals/Pain Pain Assessment: No/denies pain     Hand Dominance     Extremity/Trunk Assessment Upper Extremity Assessment Upper Extremity Assessment: Generalized weakness   Lower Extremity Assessment Lower Extremity Assessment: Generalized weakness   Cervical / Trunk Assessment Cervical / Trunk Assessment: Normal   Communication Communication Communication: HOH   Cognition Arousal/Alertness: Awake/alert Behavior During Therapy: WFL for tasks assessed/performed Overall Cognitive Status: Within Functional Limits for tasks assessed                                 General Comments: Pleasant and cooperative, some anxiety noted     General Comments  Per telemetry unit, HR with v tach and PVC's raning from 77 BPm at rest up to 110 BPM with mobility.  SpO2 95-100% on 2L O2.    Exercises Other Exercises Other Exercises: Role of PT in acute setting, D/c recs, DME recs. PLB. Other Exercises: provided education re: role of OT, self care, discharge recommendations, energy conservatin strategies   Shoulder Instructions      Home Living Family/patient expects to be discharged to:: Private residence Living Arrangements: Alone Available Help at Discharge: Family;Friend(s);Available PRN/intermittently (pt's daughter lives 2 hours away but they report friends able to help PRN) Type of Home: House Home Access: Stairs to enter [employer] of Steps: 4 Entrance Stairs-Rails: Right Home Layout: One level     Bathroom Shower/Tub: Occupational psychologist: Standard Bathroom Accessibility: Yes   Home Equipment: Shower seat;Grab bars - toilet;Other (comment) (bed rail) Adaptive Equipment: Reacher;Long-handled shoe horn        Prior  Functioning/Environment Prior Level of Function : Independent/Modified Independent;Driving             Mobility Comments: Ambulates without AD ADLs Comments: Prior to illness at end of November, patient was I with all ADLs and IADLs including driving.  He enjoys going to the grocery store, time with friends and family, and doing things around his home.        OT Problem List: Decreased strength;Decreased coordination;Cardiopulmonary status limiting activity;Decreased activity tolerance;Decreased safety awareness;Impaired balance (sitting and/or standing);Decreased knowledge of use of DME or AE      OT Treatment/Interventions: Self-care/ADL training;Therapeutic exercise;Therapeutic activities;DME and/or AE instruction;Patient/family education;Balance training;Energy conservation    OT Goals(Current goals can be found in the care plan section) Acute Rehab OT Goals Patient Stated Goal: to return to PLOF (time with friends, grocery store, football games) OT Goal Formulation: With patient/family Time For Goal Achievement: 05/12/21 Potential to Achieve Goals: Good  OT Frequency: Min 2X/week    Co-evaluation              AM-PAC OT "6 Clicks" Daily Activity     Outcome Measure Help from another person eating meals?: None Help from another person taking care of personal grooming?: None Help from another person toileting, which includes using toliet, bedpan, or urinal?: A Little Help from another person bathing (including washing, rinsing,  drying)?: A Lot Help from another person to put on and taking off regular upper body clothing?: None Help from another person to put on and taking off regular lower body clothing?: A Little 6 Click Score: 20   End of Session    Activity Tolerance: Patient tolerated treatment well Patient left: in bed;with call bell/phone within reach  OT Visit Diagnosis: Other abnormalities of gait and mobility (R26.89);Muscle weakness (generalized) (M62.81)                 Time: 9009-2004 OT Time Calculation (min): 34 min Charges:  OT General Charges $OT Visit: 1 Visit OT Evaluation $OT Eval Moderate Complexity: 1 Mod OT Treatments $Self Care/Home Management : 8-22 mins  Jeneen Montgomery, OTR/L 04/28/21, 3:46 PM

## 2021-04-28 NOTE — ED Notes (Signed)
Pt ambulated in room on RA, pt's O2 dropped to 87%. Pt placed back on 2L [place] and now at 96%. Pt has labored breathing noted and accessory muscle use. Pt states he does feel winded.

## 2021-04-28 NOTE — TOC Initial Note (Signed)
Transition of Care [facility]) - Initial/Assessment Note    Patient Details  Name: Peter [name] MRN: [id number] Date of Birth: 17-Feb-1941  Transition of Care [facility]) CM/SW Contact:    Shelbie Hutching, RN Phone Number: 04/28/2021, 12:12 PM  Clinical Narrative:                 Patient being admitted to the hospital with acute on chronic CHF currently on acute O2 at 2L [place].  Patient was [facility] from [facility] on 1/14 he got home and became short of breath and was brought back to the emergency room.  Daughter is at the bedside.  Patient is from home where he lives alone, daughter believes that he will need assisted living soon and she would like for the patient to go to [facility].  Patient and daughter would like to go back to rehab and would like to go to [facility].  They do not want to go back to [facility].  RNCM will reach out to [facility] to see if they can offer.    Expected Discharge Plan: Skilled Nursing Facility Barriers to Discharge: Continued Medical Work up   Patient Goals and CMS Choice Patient states their goals for this hospitalization and ongoing recovery are:: To get strong enough in rehab and hopefully get back home CMS Medicare.gov Compare Post Acute Care list provided to:: Patient Choice offered to / list presented to : Patient  Expected Discharge Plan and Services Expected Discharge Plan: [place]   Discharge Planning Services: CM Consult Post Acute Care Choice: [place] Living arrangements for the past 2 months: Single Family Home                 DME Arranged: N/A DME Agency: NA       HH Arranged: NA HH Agency: NA        Prior Living Arrangements/Services Living arrangements for the past 2 months: Single Family Home Lives with:: Self Patient language and need for interpreter reviewed:: Yes Do you feel safe going back to the place where you live?: Yes      Need for Family Participation in Patient Care:  Yes (Comment) Care giver support system in place?: Yes (comment) (daughter) Current home services: DME Criminal Activity/Legal Involvement Pertinent to Current Situation/Hospitalization: No - Comment as needed  Activities of Daily Living      Permission Sought/Granted Permission sought to share information with : Case Manager, Customer service manager, Family Supports Permission granted to share information with : Yes, Verbal Permission Granted  Share Information with NAME: [name]- daughter  Permission granted to share info w AGENCY: [facility], SNF's  Permission granted to share info w Relationship: daughter  Permission granted to share info w Contact Information: [phone]  Emotional Assessment Appearance:: Appears stated age Attitude/Demeanor/Rapport: Engaged Affect (typically observed): Accepting Orientation: : Oriented to Self, Oriented to Place, Oriented to  Time, Oriented to Situation Alcohol / Substance Use: Not Applicable Psych Involvement: No (comment)  Admission diagnosis:  Acute exacerbation of CHF (congestive heart failure) (HCC) [I50.9] Patient Active Problem List   Diagnosis Date Noted   Acute exacerbation of CHF (congestive heart failure) ([place]) 04/27/2021   CKD (chronic kidney disease) stage 4, GFR 15-29 ml/min (HCC) 04/04/2021   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   S/P AAA (abdominal aortic aneurysm) repair 04/04/2021   Acute anemia 04/04/2021   Thrombocytopenia ([place]) 04/04/2021   ABLA (acute blood loss anemia) 04/04/2021   AKI (  acute kidney injury) ([place]) 04/04/2021   Symptomatic anemia    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 12/19/2018   CKD (chronic kidney disease) stage 3, GFR 30-59 ml/min (HCC) 05/18/2018   Elevated hemoglobin A1c 05/18/2018   History of skin cancer 05/18/2018   Ischemic cardiomyopathy 12/16/2017   S/P ICD (internal cardiac defibrillator) procedure 12/16/2017   Acute on chronic  systolic CHF (congestive heart failure) ([place]) 09/07/2016   Congestive heart failure ([place]) 01/17/2015   Current tobacco use 01/17/2015   Hypercholesterolemia 01/17/2015   Peripheral vascular disease ([place]) 01/17/2015   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   Arthritis, degenerative 07/22/2013   H/O angina pectoris 07/22/2013   Adiposity 07/22/2013   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   Hypokalemia 12/15/2011   SOB (shortness of breath) 12/14/2011   CAD (coronary artery disease) 12/14/2011   HTN (hypertension) 12/14/2011   Hyperlipidemia 12/14/2011   GERD (gastroesophageal reflux disease) 12/14/2011   BPH (benign prostatic hyperplasia) 12/14/2011   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/14/2011   Aneurysm of thoracic aorta 12/14/2011   PCP:  Tracie Harrier, MD Pharmacy:   [facility] [place], [address] [place] [address] Phone: [phone] Fax: [phone]     Social Determinants of Health (SDOH) Interventions    Readmission Risk Interventions Readmission Risk Prevention Plan 04/28/2021  Transportation Screening Complete  PCP or Specialist Appt within 3-5 Days Complete  HRI or Home Care Consult Complete  Social Work Consult for [place] Planning/Counseling Complete  Palliative Care Screening Not Applicable  Medication Review Press photographer) Complete  Some recent data might be hidden

## 2021-04-28 NOTE — TOC Progression Note (Signed)
Transition of Care [facility]) - Progression Note    Patient Details  Name: Peter [name] MRN: [id number] Date of Birth: January 04, 1941  [facility] CM/SW Contact  Shelbie Hutching, RN Phone Number: 04/28/2021, 3:29 PM  Clinical Narrative:    [facility].  RNCM updated patient and daughter at the bedside.  Daughter is asking about assisted living and is interested in [facility] and or [place].  PT and OT are not going to recommend SNF patient is doing too well and recommendation is for [facility].  Oxygen has been ordered for home use and daughter, [name] has arranged for [facility] to come out, they were actually supposed to start today but he is in the hospital.   TOC will cont to follow, asked that the patient and daughter talk a little more about the discharge plan.  Patient is afraid to be at home by himself especially at night.  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     Expected Discharge Plan: [place] Barriers to Discharge: Continued Medical Work up  Expected Discharge Plan and Services Expected Discharge Plan: [place]   Discharge Planning Services: CM Consult Post Acute Care Choice: [place] Living arrangements for the past 2 months: Single Family Home                 DME Arranged: N/A DME Agency: NA       HH Arranged: NA HH Agency: NA         Social Determinants of Health (SDOH) Interventions    Readmission Risk Interventions Readmission Risk Prevention Plan 04/28/2021  Transportation Screening Complete  PCP or Specialist Appt within 3-5 Days Complete  HRI or [place] Complete  Social Work Consult for [place] Planning/Counseling Complete  Palliative Care Screening Not Applicable  Medication Review Press photographer) Complete  Some recent data might be hidden

## 2021-04-28 NOTE — ED Notes (Addendum)
Claiborne Billings, RN spoke to pacemaker company regarding interrogation and they reported onset fluid accumulation was noted and more PVC's noted

## 2021-04-28 NOTE — Evaluation (Signed)
Physical Therapy Evaluation Patient Details Name: Peter [name] MRN: [id number] DOB: [birth year] Today's Date: 04/28/2021  History of Present Illness  Peter [name] is a 81 y.o. Caucasian male with medical history significant for systolic and diastolic CHF, coronary artery disease, hypertension, dyslipidemia and coronary artery disease and non-STEMI status post PCI and stent, and status post AICD, who presented to the ER with acute onset of significantly worsening dyspnea today.   Clinical Impression  Pt admitted with above diagnosis. Pt received supine in bed with daughter present in room. Agreeable to PT services. Pt and daughter providing info on home lay out, PLOF, DME, etc. At baseline pt is indep with no AD and with ADL/IADL completion. Recent hospitalization led to STR stay where pt reports ambulating with and without AD. Pt resting on 2L/min with Spo2 > 90% and HR trending in mid 770's with v tach rhythm. Pt able to transfer to EoB and stand mod-I requesting use of RW. Pt able to amb 160' in RW with good gait speed, consistent step through pattern with safe sequencing with turns and transfer to sitting Eob. Pt on 2L/min during amb with RW with Spo2 >95% throughout with 1 standing rest break due to SOB. Cardiology present in room after ambulation spending ~10 or so with pt during PT session. After MD has left room, trialed ambulating on RA with no AD. Mod-I again for standing however minguard provided with ambulating 80' as pt has marked decrease in gait speed and lateral sway during ambulation however does not display LOB. Pt does endorse worsening SOB on RA however SPO2 remains > 90% during ambulation. Pt returned to sitting EOB on RA. After 2 min out of room, PT to return with pt requesting [place] due to SOB. [place] donned on 2L/min with OT at bedside for eval. Spo2 remained >90% while on RA for those brief minutes of PT out of room. Pt will benefit from [facility] PT services to further improve balance,  endurance and strength with LRAD to return to PLOF. Pt currently with functional limitations due to the deficits listed below (see PT Problem List). Pt will benefit from skilled PT to increase their independence and safety with mobility to allow discharge to the venue listed below.     Recommendations for follow up therapy are one component of a multi-disciplinary discharge planning process, led by the attending physician.  Recommendations may be updated based on patient status, additional functional criteria and insurance authorization.  Follow Up Recommendations Home health PT    Assistance Recommended at Discharge Intermittent Supervision/Assistance  Patient can return home with the following  Assist for transportation;Help with stairs or ramp for entrance    Equipment Recommendations Rolling walker (2 wheels)  Recommendations for Other Services       Functional Status Assessment Patient has had a recent decline in their functional status and demonstrates the ability to make significant improvements in function in a reasonable and predictable amount of time.     Precautions / Restrictions Precautions Precautions: Fall Restrictions Weight Bearing Restrictions: No      Mobility  Bed Mobility Overal bed mobility: Modified Independent               Patient Response: Cooperative  Transfers Overall transfer level: Modified independent Equipment used: None Transfers: Sit to/from Stand Sit to Stand: Modified independent (Device/Increase time)           General transfer comment: to RW. Endorses need for it    Ambulation/Gait Ambulation/Gait assistance:  Supervision Gait Distance (Feet): 240 Feet (160' with RW, 26' with no AD) Assistive device: Rolling walker (2 wheels);None Gait Pattern/deviations: Trunk flexed;Step-through pattern;Decreased stride length       General Gait Details: Tolerated ambulating 160' with RW with quick gait. Has definite decrease in gait  speed with no AD with slight unsteadiness appreciated with lateral sway but no LOB. SPo2 > 90% on 2L/min and on RA with ambulation. Does become SOB ambulating on RA however SPO2 > 90%  Stairs            Wheelchair Mobility    Modified Rankin (Stroke Patients Only)       Balance Overall balance assessment: Needs assistance Sitting-balance support: Feet supported Sitting balance-Leahy Scale: Good       Standing balance-Leahy Scale: Good                               Pertinent Vitals/Pain Pain Assessment: No/denies pain    Home Living Family/patient expects to be discharged to:: Private residence Living Arrangements: Alone   Type of Home: House Home Access: Stairs to enter Entrance Stairs-Rails: Right Entrance Stairs-Number of Steps: 4   Home Layout: One level Home Equipment: Shower seat;Grab bars - toilet (bed rail)      Prior Function Prior Level of Function : Independent/Modified Independent;Driving             Mobility Comments: Ambulating in SNF without AD. Indep with all ADL's/IADL's at baseline       Hand Dominance        Extremity/Trunk Assessment   Upper Extremity Assessment Upper Extremity Assessment: Generalized weakness    Lower Extremity Assessment Lower Extremity Assessment: Generalized weakness    Cervical / Trunk Assessment Cervical / Trunk Assessment: Normal  Communication   Communication: No difficulties  Cognition Arousal/Alertness: Awake/alert Behavior During Therapy: WFL for tasks assessed/performed Overall Cognitive Status: Within Functional Limits for tasks assessed                                 General Comments: Pleasant and cooperative.        General Comments General comments (skin integrity, edema, etc.): Per telemetry unit, HR with v tach and PVC's raning from 77 BPm at rest up to 110 BPM with mobility.    Exercises Other Exercises Other Exercises: Role of PT in acute setting,  D/c recs, DME recs. PLB.   Assessment/Plan    PT Assessment Patient needs continued PT services  PT Problem List Decreased strength;Decreased knowledge of use of DME;Decreased activity tolerance;Decreased balance;Decreased mobility;Cardiopulmonary status limiting activity       PT Treatment Interventions DME instruction;Gait training;Stair training;Functional mobility training;Therapeutic activities;Therapeutic exercise;Balance training;Neuromuscular re-education;Patient/family education    PT Goals (Current goals can be found in the Care Plan section)  Acute Rehab PT Goals Patient Stated Goal: pt wishing to receive rehab at facility due to living home alone PT Goal Formulation: With patient/family Time For Goal Achievement: 05/12/21 Potential to Achieve Goals: Fair    Frequency Min 2X/week     Co-evaluation               AM-PAC PT "6 Clicks" Mobility  Outcome Measure Help needed turning from your back to your side while in a flat bed without using bedrails?: None Help needed moving from lying on your back to sitting on the side of a flat bed  without using bedrails?: None Help needed moving to and from a bed to a chair (including a wheelchair)?: A Little Help needed standing up from a chair using your arms (e.g., wheelchair or bedside chair)?: A Little Help needed to walk in hospital room?: A Little Help needed climbing 3-5 steps with a railing? : A Lot 6 Click Score: 19    End of Session Equipment Utilized During Treatment: Gait belt;Oxygen Activity Tolerance: Patient tolerated treatment well Patient left: in bed;with family/visitor present (seated EOB with OT present) Nurse Communication: Mobility status PT Visit Diagnosis: Difficulty in walking, not elsewhere classified (R26.2);Muscle weakness (generalized) (M62.81);Unsteadiness on feet (R26.81)    Time: 1344-1430 PT Time Calculation (min) (ACUTE ONLY): 46 min   Charges:   PT Evaluation $PT Eval Moderate  Complexity: 1 Mod PT Treatments $Gait Training: 23-37 mins        Cortavious Nix M. Fairly IV, PT, DPT Physical Therapist- [facility]  04/28/2021, 3:11 PM

## 2021-04-28 NOTE — ED Notes (Signed)
Pt provided with bedside urinal, cup of water and warm blanket.

## 2021-04-28 NOTE — Consult Note (Signed)
Pulmonary Medicine          Date: 04/28/2021,   MRN# [id number] Peter [name] [id number]     AdmissionWeight: 93 kg                 CurrentWeight: 93 kg      CHIEF COMPLAINT:   Sob, ? Pulmonary cause   HISTORY OF PRESENT ILLNESS   This is a pleasant male, ex smoker, quit thanksgiving. Retired Insurance underwriter. 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 Loni Muse today.   He states he could not catch his breath, wanting oxygen. On exam he has edema, cxt + bilateral effusions, no angina, no ham ot increase salt intake, he has been taking his meds as he should. He been here x 2 with like sxs.   Dr. Clayborn Bigness is his cardiologist, Renal  follwing as well.      PAST MEDICAL HISTORY   Past Medical History:  Diagnosis Date   Anginal pain ([place])    Asthma    Bladder infection, acute 03/2011   "had a whole lot of bleeding from this"   CHF (congestive heart failure) ([place])    Coronary artery disease    High cholesterol    Hypertension    Macular degeneration 12/14/2011   "had it in my right; getting shots now in my left"   Myocardial infarction [facility]) 1996   NSTEMI (non-ST elevated myocardial infarction) ([place]) 12/14/2011   Shortness of breath 12/14/2011   "@ rest, lying down, w/exertion"     SURGICAL HISTORY   Past Surgical History:  Procedure Laterality Date   [place]; ~ 2003; ~ 2008   "total of 3"   CORONARY STENT INTERVENTION N/A 08/10/2016   Procedure: Coronary Stent Intervention;  Surgeon: [name], MD;  Location: [facility];  Service: Cardiovascular;  Laterality: N/A;   HERNIA REPAIR  ~ 2001   "abdominal w/mesh implanted"   LEFT HEART CATH AND CORONARY ANGIOGRAPHY N/A 08/10/2016   Procedure: Left Heart Cath and Coronary Angiography;  Surgeon:  [name], MD;  Location: [facility];  Service: Cardiovascular;  Laterality: N/A;   LEFT HEART CATHETERIZATION WITH CORONARY ANGIOGRAM N/A 12/16/2011   Procedure: LEFT HEART CATHETERIZATION WITH CORONARY ANGIOGRAM;  Surgeon: Minus Breeding, MD;  Location: [facility] CATH LAB;  Service: Cardiovascular;  Laterality: N/A;   PERCUTANEOUS CORONARY STENT INTERVENTION (PCI-S) N/A 12/17/2011   Procedure: PERCUTANEOUS CORONARY STENT INTERVENTION (PCI-S);  Surgeon: Sherren Mocha, MD;  Location: [facility] CATH LAB;  Service: Cardiovascular;  Laterality: N/A;   TONSILLECTOMY     "I was a kid"     FAMILY HISTORY   Family History  Family history unknown: Yes     SOCIAL HISTORY   Social History   Tobacco Use   Smoking status: Former    Packs/day: 0.50    Years: 0.50    Pack years: 0.25    Types: Cigarettes   Smokeless tobacco: Never   Tobacco comments:    11/23/16 Still not ready to quit smoking.  Substance Use Topics   Alcohol use: Yes    Alcohol/week: 1.0 standard drink    Types: 1 Cans of beer per week    Comment: 12/14/2011 "if my kidneys are sore, I drink 1 beer/day; if not sore; no beer; occasionally drink socially;  ave 1 beer/wk maybe"   Drug use: No     MEDICATIONS    Home Medication:  Current Outpatient Rx   Order #: [id number] Class: Normal   Order #: [id number] Class: Historical Med   Order #: [id number] Class: Historical Med   Order #: [id number] Class: Historical Med   Order #: [id number] Class: Normal   Order #: [id number] Class: Historical Med   Order #: [id number] Class: Normal   Order #: [id number] Class: No Print   Order #: [id number] Class: Historical Med   Order #: [id number] Class: Normal   Order #: [id number] Class: Historical Med   Order #: [id number] Class: Historical Med   Order #: [id number] Class: Normal   Order #: [id number] Class: Historical Med   Order #: [id number] Class: No Print   Order #: [id number] Class: No Print    Current Medication:  Current Facility-Administered  Medications:    0.9 %  sodium chloride infusion, 250 mL, Intravenous, PRN, Mansy, Jan A, MD   acetaminophen (TYLENOL) tablet 650 mg, 650 mg, Oral, Q4H PRN, Mansy, Jan A, MD   ALPRAZolam Duanne Moron) tablet 0.25 mg, 0.25 mg, Oral, BID PRN, Mansy, Jan A, MD   amLODipine (NORVASC) tablet 10 mg, 10 mg, Oral, Daily, Regalado, Belkys A, MD, 10 mg at 04/28/21 0903   atorvastatin (LIPITOR) tablet 40 mg, 40 mg, Oral, q1800, Mansy, Jan A, MD   carvedilol (COREG) tablet 6.25 mg, 6.25 mg, Oral, BID WC, Mansy, Jan A, MD, 6.25 mg at 04/28/21 1626   clobetasol cream (TEMOVATE) 0.05 %, , Topical, BID, Mansy, Jan A, MD   clopidogrel (PLAVIX) tablet 75 mg, 75 mg, Oral, Daily, Mansy, Jan A, MD, 75 mg at 04/28/21 0904   enoxaparin (LOVENOX) injection 30 mg, 30 mg, Subcutaneous, Q24H, Mansy, Jan A, MD, 30 mg at 04/27/21 2300   finasteride (PROSCAR) tablet 5 mg, 5 mg, Oral, Daily, Mansy, Jan A, MD, 5 mg at 04/28/21 0904   furosemide (LASIX) injection 40 mg, 40 mg, Intravenous, Q12H, Mansy, Jan A, MD, 40 mg at 04/28/21 0610   hydrALAZINE (APRESOLINE) tablet 100 mg, 100 mg, Oral, Q8H, Mansy, Jan A, MD, 100 mg at 04/28/21 0610   isosorbide mononitrate (IMDUR) 24 hr tablet 60 mg, 60 mg, Oral, Daily, Regalado, Belkys A, MD, 60 mg at 04/28/21 0904   labetalol (NORMODYNE) injection 20 mg, 20 mg, Intravenous, Q3H PRN, Mansy, Jan A, MD, 20 mg at 04/27/21 2117   loratadine (CLARITIN) tablet 10 mg, 10 mg, Oral, Daily, Mansy, Jan A, MD, 10 mg at 04/28/21 0905   nitroGLYCERIN (NITROGLYN) 2 % ointment 1 inch, 1 inch, Topical, Q6H PRN, Mansy, Jan A, MD, 1 inch at 04/27/21 2300   ondansetron (ZOFRAN) injection 4 mg, 4 mg, Intravenous, Q6H PRN, Mansy, Jan A, MD   pantoprazole (PROTONIX) EC tablet 40 mg, 40 mg, Oral, Daily, Mansy, Jan A, MD, 40 mg at 04/28/21 0904   potassium chloride (KLOR-CON) packet 40 mEq, 40 mEq, Oral, Once, Mansy, Jan A, MD   sodium chloride flush (NS) 0.9 % injection 3 mL, 3 mL, Intravenous, Q12H, Mansy, Jan A, MD, 3  mL at 04/28/21 7353   sodium chloride flush (NS) 0.9 % injection 3 mL, 3 mL, Intravenous, PRN, Mansy, Jan A, MD   tamsulosin [facility]) capsule 0.4 mg, 0.4 mg, Oral, Daily, Mansy, Jan A, MD, 0.4 mg at 04/28/21 2992   vitamin B-12 (CYANOCOBALAMIN) tablet 1,000 mcg, 1,000 mcg, Oral, Daily, Mansy, Jan A, MD, 1,000 mcg at 04/28/21 4268  Current Outpatient Medications:    atorvastatin (LIPITOR) 40 MG tablet, Take 1 tablet (40  mg total) by mouth daily at 6 PM., Disp: 30 tablet, Rfl: 0   carvedilol (COREG) 6.25 MG tablet, Take 6.25 mg by mouth 2 (two) times daily with a meal., Disp: , Rfl:    cetirizine (ZYRTEC) 10 MG tablet, Take 10 mg by mouth., Disp: , Rfl:    clobetasol cream (TEMOVATE) 2.95 %, Apply 1 application topically 2 (two) times daily as needed (psoriasis)., Disp: , Rfl: 1   clopidogrel (PLAVIX) 75 MG tablet, TAKE 1 TABLET(75 MG) BY MOUTH EVERY DAY, Disp: 30 tablet, Rfl: 1   cyanocobalamin 1000 MCG tablet, Take 1,000 mcg by mouth daily. , Disp: , Rfl:    finasteride (PROSCAR) 5 MG tablet, Take 1 tablet (5 mg total) by mouth daily., Disp: 90 tablet, Rfl: 3   furosemide (LASIX) 20 MG tablet, Hold this medication until followup with nephrology or cardiology. (Patient taking differently: Take 20 mg by mouth daily.), Disp: 30 tablet, Rfl:    GLUCOSAMINE-CHONDROITIN-VIT C PO, Take 1 tablet by mouth daily., Disp: , Rfl:    hydrALAZINE (APRESOLINE) 100 MG tablet, Take 1 tablet (100 mg total) by mouth every 8 (eight) hours. (Patient taking differently: Take 100 mg by mouth every 12 (twelve) hours.), Disp: 270 tablet, Rfl: 0   isosorbide mononitrate (IMDUR) 30 MG 24 hr tablet, Take 30 mg by mouth daily., Disp: , Rfl:    omeprazole (PRILOSEC) 20 MG capsule, Take 20 mg by mouth in the morning., Disp: , Rfl:    tamsulosin (FLOMAX) 0.4 MG CAPS capsule, Take 1 capsule (0.4 mg total) by mouth daily., Disp: 90 capsule, Rfl: 3   calcitRIOL (ROCALTROL) 0.25 MCG capsule, Take 0.25 mcg by mouth daily., Disp: ,  Rfl:    losartan (COZAAR) 100 MG tablet, Hold until followup with your outpatient doctor due to worsening kidney function., Disp: , Rfl:    spironolactone (ALDACTONE) 25 MG tablet, Hold until followup with your outpatient doctor due to worsening kidney function., Disp: , Rfl:     ALLERGIES   Iodinated contrast media and Iodinated glycerol  [glycerol, iodinated]     REVIEW OF SYSTEMS    Review of Systems:  Gen:  Denies  fever, sweats, chills weigh loss  HEENT: Denies blurred vision, double vision, ear pain, eye pain, hearing loss, nose bleeds, sore throat Cardiac:  No dizziness, chest pain or heaviness, chest tightness,edema Resp:   occasional  cough or sputum porduction, ++ shortness of breath, wheezing, hemoptysis,  Gi: Denies swallowing difficulty, stomach pain, nausea or vomiting, diarrhea, constipation, bowel incontinence Gu:  Denies bladder incontinence, burning urine Ext:   Denies Joint pain, stiffness or swelling Skin: Denies  skin rash, easy bruising or bleeding or hives Endoc:  Denies polyuria, polydipsia , polyphagia or weight change Psych:   Denies depression, insomnia or hallucinations   Other:  All other systems negative   VS: BP (!) 164/71    Pulse 68    Temp 98.3 F (36.8 C) (Oral)    Resp 18    Ht 5\' 11"  (1.803 m)    Wt 93 kg    SpO2 99%    BMI 28.59 kg/m      PHYSICAL EXAM    GENERAL:[place] 02 on, sats 99% no fevers, chills, no weakness no fatigue HEAD: Normocephalic, atraumatic.  EYES: Pupils equal, round, reactive to light. Extraocular muscles intact. No scleral icterus.  MOUTH: Moist mucosal membrane. Dentition intact. No abscess noted.  EAR, NOSE, THROAT: Clear without exudates. No external lesions.  NECK: Supple. No thyromegaly. No  nodules. No JVD.  PULMONARY: decrease bs at the bases CARDIOVASCULAR: S1 and S2. Regular rate and rhythm. No murmurs, rubs, or gallops. No edema. Pedal pulses 2+ bilaterally.  GASTROINTESTINAL: Soft, nontender,  nondistended. No masses. Positive bowel sounds. No hepatosplenomegaly.  MUSCULOSKELETAL: No swelling, clubbing, + edema. Range of motion full in all extremities.  NEUROLOGIC: Cranial nerves II through XII are intact except had of hearing No gross focal neurological deficits. Sensation intact. Reflexes intact.  SKIN: No ulceration, lesions, rashes, or cyanosis. Skin warm and dry. Turgor intact.  PSYCHIATRIC: Mood, affect within normal limits. The patient is awake, alert and oriented x 3. Insight, judgment intact.       IMAGING    CT ABDOMEN PELVIS WO CONTRAST  Result Date: 04/04/2021 CLINICAL DATA:  Retroperitoneal hemorrhage suspected. EXAM: CT ABDOMEN AND PELVIS WITHOUT CONTRAST TECHNIQUE: Multidetector CT imaging of the abdomen and pelvis was performed following the standard protocol without IV contrast. COMPARISON:  01/29/2016 FINDINGS: Lower chest: Bibasilar atelectasis noted with tiny bilateral pleural effusions. Hepatobiliary: No focal abnormality in the liver on this study without intravenous contrast. Layering tiny gallstones evident. No intrahepatic or extrahepatic biliary dilation. Pancreas: No focal mass lesion. No dilatation of the main duct. No intraparenchymal cyst. No peripancreatic edema. Spleen: No splenomegaly. No focal mass lesion. Adrenals/Urinary Tract: No adrenal nodule or mass. Small cyst noted upper pole left kidney. No evidence for hydroureter. The urinary bladder appears normal for the degree of distention. Stomach/Bowel: Stomach is unremarkable. No gastric wall thickening. No evidence of outlet obstruction. Duodenum is normally positioned as is the ligament of Treitz. No small bowel wall thickening. No small bowel dilatation. The terminal ileum is normal. The appendix is normal. No gross colonic mass. No colonic wall thickening. Diverticular changes are noted in the left colon without evidence of diverticulitis. Vascular/Lymphatic: Status post aortic endograft placement.  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 Low-attenuation left para-aortic lymph nodes (image 32/2) are stable since 2017 consistent with benign etiology. No pelvic sidewall lymphadenopathy. Reproductive: The prostate gland and seminal vesicles are unremarkable. Other: No intraperitoneal free fluid. Musculoskeletal: Multiple supraumbilical ventral hernias identified, containing only fat. Stable bone island left anterior pubic ramus. No worrisome lytic or sclerotic osseous abnormality. IMPRESSION: 1. No acute findings in the abdomen or pelvis. Specifically, no findings to suggests retroperitoneal hemorrhage. 2. 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 3. No change in the left para-aortic lymphadenopathy consistent with benign/reactive etiology. 4. Cholelithiasis. 5. Multiple supraumbilical ventral hernias containing only fat. Electronically Signed   By: Misty Stanley M.D.   On: 04/04/2021 07:36   DG Chest 2 View  Result Date: 04/27/2021 CLINICAL DATA:  Chest pain. EXAM: CHEST - 2 VIEW COMPARISON:  None. FINDINGS: Stable position of cardiac pacemaker.  Aortic stent graft noted. Tortuosity and calcific atherosclerotic disease of the aorta. Cardiomediastinal silhouette is normal. Mediastinal contours appear intact. There is no evidence of focal airspace consolidation, pleural effusion or pneumothorax. Probable bilateral small pleural effusions. Osseous structures are without acute abnormality. Soft tissues are grossly normal. IMPRESSION: Probable bilateral small pleural effusions. Electronically Signed   By: Fidela Salisbury M.D.   On: 04/27/2021 13:45   DG Chest 2 View  Result Date: 04/11/2021 CLINICAL DATA:  Shortness of breath EXAM: CHEST - 2 VIEW COMPARISON:  04/03/2021 FINDINGS: No focal consolidation. No pleural effusion or pneumothorax. Heart and mediastinal contours are unremarkable. Dual lead  cardiac pacemaker. No  acute osseous abnormality. IMPRESSION: No active cardiopulmonary disease. Electronically Signed   By: Kathreen Devoid M.D.   On: 04/11/2021 15:11   DG Chest 2 View  Result Date: 04/03/2021 CLINICAL DATA:  sob EXAM: CHEST - 2 VIEW COMPARISON:  Chest x-ray 09/07/2016, CT chest 12/14/2011. FINDINGS: The heart and mediastinal contours are unchanged. Coronary artery stent. Aortic calcification. The distal descending thoracic aorta/suprarenal abdominal aorta stent noted on lateral view. Three lead cardiac pacemaker/defibrillator overlies the left chest wall. No focal consolidation. No pulmonary edema. Blunting of bilateral costophrenic angles. No pneumothorax. No acute osseous abnormality. IMPRESSION: Blunting of bilateral costophrenic angles. Trace pleural effusions not excluded. Electronically Signed   By: Iven Finn M.D.   On: 04/03/2021 22:56   CT CHEST WO CONTRAST  Result Date: 04/12/2021 CLINICAL DATA:  Shortness of breath. EXAM: CT CHEST WITHOUT CONTRAST TECHNIQUE: Multidetector CT imaging of the chest was performed following the standard protocol without IV contrast. COMPARISON:  December 14, 2011 FINDINGS: Cardiovascular: A dual lead AICD is in place. There is moderate to marked severity calcification of the aortic arch and descending thoracic aorta. 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 A stent is noted within the distal aspect of the descending thoracic aorta. This extends to include the visualized portion of the abdominal aorta. Normal heart size with coronary artery stents in place. No pericardial effusion. Mediastinum/Nodes: There is mild AP window and pretracheal lymphadenopathy. Thyroid gland, trachea, and esophagus demonstrate no significant findings. Lungs/Pleura: There is mild emphysematous lung disease involving the bilateral upper lobes. Mild areas of linear scarring and/or atelectasis are noted within the posterior aspects of the bilateral lung  bases. There are very small bilateral pleural effusions. No pneumothorax is identified. Upper Abdomen: A thin layer of tiny gallstones is seen within the dependent portion of the gallbladder. Vascular stents are seen within the origins of the celiac artery, superior mesenteric artery and bilateral renal arteries. Musculoskeletal: Multilevel degenerative changes seen throughout the thoracic spine. IMPRESSION: 1. Mild emphysematous lung disease. 2. Mild bibasilar linear scarring and/or atelectasis. 3. Very small bilateral pleural effusions. 4. Extensive stenting of the distal descending thoracic aorta, as well as the abdominal aorta and several of its branches. 5. Cholelithiasis. Aortic Atherosclerosis (ICD10-I70.0) and Emphysema (ICD10-J43.9). Electronically Signed   By: Virgina Norfolk M.D.   On: 04/12/2021 04:11   NM Pulmonary Perfusion  Result Date: 04/12/2021 CLINICAL DATA:  Evaluate for pulmonary embolus. Shortness of breath. EXAM: NUCLEAR MEDICINE PERFUSION LUNG SCAN TECHNIQUE: Perfusion images were obtained in multiple projections after intravenous injection of radiopharmaceutical. Ventilation scans intentionally deferred if perfusion scan and chest x-ray adequate for interpretation during COVID 19 epidemic. RADIOPHARMACEUTICALS:  4.3 mCi Tc-38m MAA IV COMPARISON:  Chest radiograph 04/11/21 FINDINGS: On the chest radiograph from 04/11/2021 there is a ICD overlying the left chest wall. No focal pulmonary opacities identified. On the perfusion portion of the examination there is a focal peripheral defect overlying the left upper lobe corresponding to the ICD battery pack. Mild heterogeneous distribution of the radiopharmaceutical is noted within both lungs. On the lateral projection images there are large nonsegmental areas of diminished activity within both posterior and upper lung zones which likely represents non-uniform soft tissue attenuation artifact secondary to patient's upper extremities. No  peripheral segmental perfusion defects to suggest acute pulmonary embolus. IMPRESSION: 1. No evidence for acute pulmonary embolus. Electronically Signed   By: Kerby Moors M.D.   On: 04/12/2021 11:01   US RENAL  Result Date: 04/08/2021  CLINICAL DATA:  Renal dysfunction. EXAM: RENAL / URINARY TRACT ULTRASOUND COMPLETE COMPARISON:  Abdominal sonogram done on 06/20/2015 FINDINGS: Right Kidney: Renal measurements: 10.9 x 5.3 x 5 cm = volume: 152 mL. There is no hydronephrosis. There is increased cortical echogenicity. Left Kidney: Renal measurements: 11.2 x 5.5 x 4.5 cm = volume: 147 mL. There is no hydronephrosis. There is increased cortical echogenicity. There is 2 cm cyst in the upper pole. Bladder: Appears normal for degree of bladder distention. Other: None. IMPRESSION: There is no hydronephrosis. Increased cortical echogenicity suggests medical renal disease. 2 cm left renal cyst. Electronically Signed   By: Elmer Picker M.D.   On: 04/08/2021 16:11   US Venous Img Lower Bilateral  Result Date: 04/04/2021 CLINICAL DATA:  Bilateral lower extremity edema, dyspnea EXAM: BILATERAL LOWER EXTREMITY VENOUS DOPPLER ULTRASOUND TECHNIQUE: Gray-scale sonography with compression, as well as color and duplex ultrasound, were performed to evaluate the deep venous system(s) from the level of the common femoral vein through the popliteal and proximal calf veins. COMPARISON:  None. FINDINGS: VENOUS Normal compressibility of the common femoral, superficial femoral, and popliteal veins, as well as the visualized calf veins. Visualized portions of profunda femoral vein and great saphenous vein unremarkable. No filling defects to suggest DVT on grayscale or color Doppler imaging. Doppler waveforms show normal direction of venous flow, normal respiratory plasticity and response to augmentation. OTHER None. Limitations: none IMPRESSION: Negative. Electronically Signed   By: Fidela Salisbury M.D.   On: 04/04/2021 03:32    US Venous Img Upper Uni Left  Result Date: 04/11/2021 CLINICAL DATA:  Left upper extremity pain and swelling. EXAM: LEFT UPPER EXTREMITY VENOUS DOPPLER ULTRASOUND TECHNIQUE: Gray-scale sonography with graded compression, as well as color Doppler and duplex ultrasound were performed to evaluate the upper extremity deep venous system from the level of the subclavian vein and including the jugular, axillary, basilic, radial, ulnar and upper cephalic vein. Spectral Doppler was utilized to evaluate flow at rest and with distal augmentation maneuvers. COMPARISON:  None. FINDINGS: Contralateral Subclavian Vein: Respiratory phasicity is normal and symmetric with the symptomatic side. No evidence of thrombus. Normal compressibility. Internal Jugular Vein: No evidence of thrombus. Normal compressibility, respiratory phasicity and response to augmentation. Subclavian Vein: No evidence of thrombus. Normal compressibility, respiratory phasicity and response to augmentation. Axillary Vein: No evidence of thrombus. Normal compressibility, respiratory phasicity and response to augmentation. Cephalic Vein: No evidence of thrombus. Normal compressibility, respiratory phasicity and response to augmentation. Basilic Vein: No evidence of thrombus. Normal compressibility, respiratory phasicity and response to augmentation. Brachial Veins: No evidence of thrombus. Normal compressibility, respiratory phasicity and response to augmentation. Radial Veins: No evidence of thrombus. Normal compressibility, respiratory phasicity and response to augmentation. Ulnar Veins: No evidence of thrombus. Normal compressibility, respiratory phasicity and response to augmentation. Venous Reflux:  None visualized. Other Findings:  None visualized. IMPRESSION: No evidence of DVT within the LEFT upper extremity. Electronically Signed   By: Virgina Norfolk M.D.   On: 04/11/2021 20:25   ECHOCARDIOGRAM COMPLETE  Result Date: 04/06/2021     ECHOCARDIOGRAM REPORT   Patient Name:   CHANCEY RINGEL Date of Exam: 04/04/2021 Medical Rec #:  [id number]        Height:       70.0 in Accession #:    [id number]       Weight:       220.0 lb Date of Birth:  May 16, 1940       BSA:  2.174 m Patient Age:    81 years         BP:           198/65 mmHg Patient Gender: M                HR:           89 bpm. Exam Location:  [facility] Procedure: 2D Echo, Cardiac Doppler and Color Doppler Indications:     Elevated Troponin  History:         Patient has prior history of Echocardiogram examinations, most                  recent 08/08/2016. CHF, Previous Myocardial Infarction; Risk                  Factors:Hypertension.  Sonographer:     Sherrie Sport Referring Phys:  [id number] AGBATA Diagnosing Phys: Yolonda Kida MD  Sonographer Comments: Suboptimal parasternal window. IMPRESSIONS  1. Left ventricular ejection fraction, by estimation, is 50 to 55%. The left ventricle has low normal function. The left ventricle has no regional wall motion abnormalities. The left ventricular internal cavity size was mildly dilated. There is mild left ventricular hypertrophy. Left ventricular diastolic parameters are consistent with Grade II diastolic dysfunction (pseudonormalization).  2. Right ventricular systolic function is low normal. The right ventricular size is moderately enlarged.  3. The mitral valve is normal in structure. Mild mitral valve regurgitation.  4. The aortic valve is normal in structure. Aortic valve regurgitation is mild. FINDINGS  Left Ventricle: Left ventricular ejection fraction, by estimation, is 50 to 55%. The left ventricle has low normal function. The left ventricle has no regional wall motion abnormalities. The left ventricular internal cavity size was mildly dilated. There is mild left ventricular hypertrophy. Left ventricular diastolic parameters are consistent with Grade II diastolic dysfunction (pseudonormalization). Right Ventricle: The right  ventricular size is moderately enlarged. No increase in right ventricular wall thickness. Right ventricular systolic function is low normal. Left Atrium: Left atrial size was normal in size. Right Atrium: Right atrial size was normal in size. Pericardium: There is no evidence of pericardial effusion. Mitral Valve: The mitral valve is normal in structure. Mild mitral valve regurgitation. Tricuspid Valve: The tricuspid valve is normal in structure. Tricuspid valve regurgitation is mild. Aortic Valve: The aortic valve is normal in structure. Aortic valve regurgitation is mild. Aortic valve mean gradient measures 4.0 mmHg. Aortic valve peak gradient measures 6.7 mmHg. Aortic valve area, by VTI measures 3.09 cm. Pulmonic Valve: The pulmonic valve was normal in structure. Pulmonic valve regurgitation is not visualized. Aorta: The ascending aorta was not well visualized. IAS/Shunts: No atrial level shunt detected by color flow Doppler. Additional Comments: A device lead is visualized. There is no pleural effusion.  LEFT VENTRICLE PLAX 2D LVIDd:         4.90 cm   Diastology LVIDs:         3.30 cm   LV e' medial:    7.07 cm/s LV PW:         1.50 cm   LV E/e' medial:  14.0 LV IVS:        1.05 cm   LV e' lateral:   8.27 cm/s LVOT diam:     2.10 cm   LV E/e' lateral: 12.0 LV SV:         86 LV SV Index:   40 LVOT Area:     3.46 cm  RIGHT  VENTRICLE RV Basal diam:  4.40 cm RV S prime:     14.40 cm/s TAPSE (M-mode): 4.7 cm LEFT ATRIUM           Index        RIGHT ATRIUM           Index LA diam:      2.90 cm 1.33 cm/m   RA Area:     27.00 cm LA Vol (A2C): 45.6 ml 20.98 ml/m  RA Volume:   85.90 ml  39.52 ml/m LA Vol (A4C): 56.1 ml 25.81 ml/m  AORTIC VALVE                    PULMONIC VALVE AV Area (Vmax):    3.01 cm     PV Vmax:        0.70 m/s AV Area (Vmean):   3.08 cm     PV Vmean:       44.100 cm/s AV Area (VTI):     3.09 cm     PV VTI:         0.107 m AV Vmax:           129.00 cm/s  PV Peak grad:   1.9 mmHg AV Vmean:           88.400 cm/s  PV Mean grad:   1.0 mmHg AV VTI:            0.279 m      RVOT Peak grad: 3 mmHg AV Peak Grad:      6.7 mmHg AV Mean Grad:      4.0 mmHg LVOT Vmax:         112.00 cm/s LVOT Vmean:        78.700 cm/s LVOT VTI:          0.249 m LVOT/AV VTI ratio: 0.89  AORTA Ao Root diam: 3.40 cm MITRAL VALVE               TRICUSPID VALVE MV Area (PHT): 4.83 cm    TR Peak grad:   40.4 mmHg MV Decel Time: 157 msec    TR Vmax:        318.00 cm/s MV E velocity: 99.00 cm/s MV A velocity: 87.00 cm/s  SHUNTS MV E/A ratio:  1.14        Systemic VTI:  0.25 m                            Systemic Diam: 2.10 cm                            Pulmonic VTI:  0.149 m Dwayne Prince Rome MD Electronically signed by Yolonda Kida MD Signature Date/Time: 04/06/2021/11:32:38 AM    Final       ASSESSMENT/PLAN   His sob is multifactorial. Primarily fluid overload ( pulm edema, bilateral effusions from renal failure and heart. ( Diastolic heart failure) His anxiety is also a limiting factor. I also suspect base on his smoking hx that he also has copd  - cardiology.renal consults  - diuresis as you are doing  - resume his cardiac regimen ( holding spirolactone)  - oxygen (asses for home o2 prior to d/c)  - duo neb q 6 hrs ptn  - anxiety coverage (xanac, buspar 5 mg q 12 hts)  - out patient pfts  - Dr. Loni Muse to see  in am      Thank you for allowing me to participate in the care of this patient.   Patient/Family are satisfied with care plan and all questions have been answered.  This document was prepared using Dragon voice recognition software and may include unintentional dictation errors.     Wallene Huh, M.D.  Division of [place]

## 2021-04-28 NOTE — Progress Notes (Signed)
[facility]  ROUNDING NOTE   Subjective:   Peter [name] is a 81 y.o. 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  Patient presents to the emergency department with complaint of worsening shortness of breath.  Patient describes a sudden onset.  Reports shortness of breath with activity and during the night.  Patient has been admitted for Acute exacerbation of CHF (congestive heart failure) ([place]) [I50.9]  Patient is known to our practice and receives outpatient care from Dr. Juleen China.  Patient was seen in office last week.  Caregiver reported at that time that she will be out of town for about a month visiting family.  Patient had a recent inpatient stay due to anemia and worsening renal function.  Patient was taken off Lasix at that time and has remained off.  He states he has been compliant with all other medications.  Denies nausea, vomiting, and diarrhea.  Denies abdominal pain and discomfort.  Patient states he always has a dry cough.  Labs on arrival include glucose 114, BUN 40, and creatinine 2.83 with GFR 22.  Respiratory panel negative for flu and COVID-19.  Chest x-ray shows bilateral small pleural effusions.  We have been consulted to evaluate and monitor renal function during this admission  Objective:  Vital signs in last 24 hours:  Temp:  [98.3 F (36.8 C)] 98.3 F (36.8 C) (01/15 1300) Pulse Rate:  [43-84] 62 (01/16 1145) Resp:  [14-27] 18 (01/16 1145) BP: (137-218)/(48-110) 164/66 (01/16 1145) SpO2:  [92 %-100 %] 100 % (01/16 1145) Weight:  [93 kg] 93 kg (01/15 1300)  Weight change:  Filed Weights   04/27/21 1300  Weight: 93 kg    Intake/Output: I/O last 3 completed shifts: In: 48.4 [IV Piggyback:48.4] Out: 400 [Urine:400]   Intake/Output this shift:  Total I/O In: -  Out: 400 [Urine:400]  Physical  Exam: General: NAD, resting on stretcher  Head: Normocephalic, atraumatic. Moist oral mucosal membranes  Eyes: Anicteric  Lungs:  Diminished in bases, normal effort, 2 L [place]  Heart: Regular rate and rhythm  Abdomen:  Soft, nontender  Extremities: 1+ peripheral edema.  Neurologic: Nonfocal, moving all four extremities  Skin: No lesions       Basic Metabolic Panel: Recent Labs  Lab 04/21/21 2219 04/27/21 1305 04/27/21 2246 04/28/21 0611  NA 139 138  --  142  K 4.1 4.0   3.9  --  3.5  CL 107 108  --  109  CO2 26 24  --  24  GLUCOSE 120* 114*  --  87  BUN 47* 40*  --  44*  CREATININE 3.26* 2.83* 3.10* 3.04*  CALCIUM 8.3* 8.2*  --  8.1*  MG  --  1.8  --   --     Liver Function Tests: No results for input(s): AST, ALT, ALKPHOS, BILITOT, PROT, ALBUMIN in the last 168 hours. No results for input(s): LIPASE, AMYLASE in the last 168 hours. No results for input(s): AMMONIA in the last 168 hours.  CBC: Recent Labs  Lab 04/21/21 2219 04/27/21 1305 04/27/21 2246  WBC 6.1 7.0 6.3  HGB 10.6* 11.4* 10.9*  HCT 33.4* 35.0* 33.1*  MCV 92.0 89.7 90.9  PLT 161 168 144*    Cardiac Enzymes: No results for input(s): CKTOTAL, CKMB, CKMBINDEX, TROPONINI in the last 168 hours.  BNP: Invalid input(s): POCBNP  CBG: No results  for input(s): GLUCAP in the last 168 hours.  Microbiology: Results for orders placed or performed during the hospital encounter of 04/27/21  Resp Panel by RT-PCR (Flu A&B, Covid) Nasopharyngeal Swab     Status: None   Collection Time: 04/27/21  6:30 PM   Specimen: Nasopharyngeal Swab; Nasopharyngeal(NP) swabs in vial transport medium  Result Value Ref Range Status   SARS Coronavirus 2 by RT PCR NEGATIVE NEGATIVE Final    Comment: (NOTE) SARS-CoV-2 target nucleic acids are NOT DETECTED.  The SARS-CoV-2 RNA is generally detectable in upper respiratory specimens during the acute phase of infection. The lowest concentration of SARS-CoV-2 viral copies this  assay can detect is 138 copies/mL. A negative result does not preclude SARS-Cov-2 infection and should not be used as the sole basis for treatment or other patient management decisions. A negative result may occur with  improper specimen collection/handling, submission of specimen other than nasopharyngeal swab, presence of viral mutation(s) within the areas targeted by this assay, and inadequate number of viral copies(<138 copies/mL). A negative result must be combined with clinical observations, patient history, and epidemiological information. The expected result is Negative.  Fact Sheet for Patients:  EntrepreneurPulse.com.au  Fact Sheet for Healthcare Providers:  IncredibleEmployment.be  This test is no t yet approved or cleared by the [place] FDA and  has been authorized for detection and/or diagnosis of SARS-CoV-2 by FDA under an Emergency Use Authorization (EUA). This EUA will remain  in effect (meaning this test can be used) for the duration of the COVID-19 declaration under Section 564(b)(1) of the Act, 21 U.S.C.section 360bbb-3(b)(1), unless the authorization is terminated  or revoked sooner.       Influenza A by PCR NEGATIVE NEGATIVE Final   Influenza B by PCR NEGATIVE NEGATIVE Final    Comment: (NOTE) The Xpert Xpress SARS-CoV-2/FLU/RSV plus assay is intended as an aid in the diagnosis of influenza from Nasopharyngeal swab specimens and should not be used as a sole basis for treatment. Nasal washings and aspirates are unacceptable for Xpert Xpress SARS-CoV-2/FLU/RSV testing.  Fact Sheet for Patients: EntrepreneurPulse.com.au  Fact Sheet for Healthcare Providers: IncredibleEmployment.be  This test is not yet approved or cleared by the [place] FDA and has been authorized for detection and/or diagnosis of SARS-CoV-2 by FDA under an Emergency Use Authorization (EUA). This EUA will  remain in effect (meaning this test can be used) for the duration of the COVID-19 declaration under Section 564(b)(1) of the Act, 21 U.S.C. section 360bbb-3(b)(1), unless the authorization is terminated or revoked.  Performed at [facility], [address] [place], [place] [address]     Coagulation Studies: No results for input(s): LABPROT, INR in the last 72 hours.  Urinalysis: No results for input(s): COLORURINE, LABSPEC, PHURINE, GLUCOSEU, HGBUR, BILIRUBINUR, KETONESUR, PROTEINUR, UROBILINOGEN, NITRITE, LEUKOCYTESUR in the last 72 hours.  Invalid input(s): APPERANCEUR    Imaging: DG Chest 2 View  Result Date: 04/27/2021 CLINICAL DATA:  Chest pain. EXAM: CHEST - 2 VIEW COMPARISON:  None. FINDINGS: Stable position of cardiac pacemaker.  Aortic stent graft noted. Tortuosity and calcific atherosclerotic disease of the aorta. Cardiomediastinal silhouette is normal. Mediastinal contours appear intact. There is no evidence of focal airspace consolidation, pleural effusion or pneumothorax. Probable bilateral small pleural effusions. Osseous structures are without acute abnormality. Soft tissues are grossly normal. IMPRESSION: Probable bilateral small pleural effusions. Electronically Signed   By: Fidela Salisbury M.D.   On: 04/27/2021 13:45     Medications:    sodium chloride  amLODipine  10 mg Oral Daily   atorvastatin  40 mg Oral q1800   carvedilol  6.25 mg Oral BID WC   clobetasol cream   Topical BID   clopidogrel  75 mg Oral Daily   enoxaparin (LOVENOX) injection  30 mg Subcutaneous Q24H   finasteride  5 mg Oral Daily   furosemide  40 mg Intravenous Q12H   hydrALAZINE  100 mg Oral Q8H   isosorbide mononitrate  60 mg Oral Daily   loratadine  10 mg Oral Daily   pantoprazole  40 mg Oral Daily   potassium chloride  40 mEq Oral Once   sodium chloride flush  3 mL Intravenous Q12H   tamsulosin  0.4 mg Oral Daily   cyanocobalamin  1,000 mcg Oral Daily   sodium  chloride, acetaminophen, ALPRAZolam, labetalol, nitroGLYCERIN, ondansetron (ZOFRAN) IV, sodium chloride flush  Assessment/ Plan:  Peter [name] is a 81 y.o.  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  Patient presents to the emergency department with complaint of worsening shortness of breath.  Patient describes a sudden onset.  Reports shortness of breath with activity and during the night.  Patient has been admitted for Acute exacerbation of CHF (congestive heart failure) ([place]) [I50.9]   Acute Kidney Injury on chronic kidney disease stage IV with baseline creatinine 2.8 and GFR of 22 on 04/27/21.  Acute kidney injury possibly secondary to cardiorenal syndrome Chronic kidney disease is secondary to multiple factors including hypertension and extensive cardiac history. ACE-I/ARB losartan held No indication for dialysis at this time. No IV contrast exposure Agree with Lasix at this time and will monitor renal function.  Continue to avoid nephrotoxic agents and therapies if possible.  Lab Results  Component Value Date   CREATININE 3.04 (H) 04/28/2021   CREATININE 3.10 (H) 04/27/2021   CREATININE 2.83 (H) 04/27/2021    Intake/Output Summary (Last 24 hours) at 04/28/2021 1157 Last data filed at 04/28/2021 0915 Gross per 24 hour  Intake 48.42 ml  Output 800 ml  Net -751.58 ml   2.  Hypotension with chronic kidney disease.  Home regimen includes carvedilol, furosemide, hydralazine, isosorbide, losartan, and spironolactone.  Currently also receiving amlodipine and labetalol.  Losartan and spironolactone currently held.  BP currently 164/66  3.  Combined systolic and diastolic heart failure.  Followed by Dr. Georgia Dom.  Echo completed on 04/04/2021 indicates EF of 50 to 55% with mild LVH and grade 2 diastolic dysfunction.  With right  ventricular enlargement.    LOS: 1 Nahia Nissan 1/16/202311:57 AM

## 2021-04-29 LAB — BASIC METABOLIC PANEL
Anion gap: 7 (ref 5–15)
BUN: 44 mg/dL — ABNORMAL HIGH (ref 8–23)
CO2: 26 mmol/L (ref 22–32)
Calcium: 8.1 mg/dL — ABNORMAL LOW (ref 8.9–10.3)
Chloride: 108 mmol/L (ref 98–111)
Creatinine, Ser: 2.81 mg/dL — ABNORMAL HIGH (ref 0.61–1.24)
GFR, Estimated: 22 mL/min — ABNORMAL LOW (ref >60)
Glucose, Bld: 92 mg/dL (ref 70–99)
Potassium: 3.6 mmol/L (ref 3.5–5.1)
Sodium: 141 mmol/L (ref 135–145)

## 2021-04-29 MED ORDER — SPIRONOLACTONE 25 MG PO TABS
25.0000 mg | ORAL_TABLET | Freq: Every day | ORAL | Status: DC
  Administered 2021-04-29: 25 mg via ORAL
  Filled 2021-04-29: qty 1

## 2021-04-29 MED ORDER — ORAL CARE MOUTH RINSE
15.0000 mL | Freq: Two times a day (BID) | OROMUCOSAL | Status: DC
  Administered 2021-04-30 (×2): 15 mL via OROMUCOSAL

## 2021-04-29 MED ORDER — AZITHROMYCIN 250 MG PO TABS
250.0000 mg | ORAL_TABLET | Freq: Every day | ORAL | Status: DC
  Administered 2021-04-29 – 2021-05-02 (×4): 250 mg via ORAL
  Filled 2021-04-29 (×4): qty 1

## 2021-04-29 MED ORDER — IPRATROPIUM-ALBUTEROL 0.5-2.5 (3) MG/3ML IN SOLN
3.0000 mL | Freq: Four times a day (QID) | RESPIRATORY_TRACT | Status: DC | PRN
  Administered 2021-04-29: 3 mL via RESPIRATORY_TRACT
  Filled 2021-04-29: qty 3

## 2021-04-29 MED ORDER — PREDNISONE 10 MG PO TABS
10.0000 mg | ORAL_TABLET | Freq: Every day | ORAL | Status: DC
  Administered 2021-04-29 – 2021-05-02 (×4): 10 mg via ORAL
  Filled 2021-04-29 (×4): qty 1

## 2021-04-29 NOTE — Progress Notes (Signed)
CARDIOLOGY CONSULT NOTE    Patient ID: Peter [name] MRN: [id number] DOB/AGE: [birth year] 82 y.o.  Admit date: 04/27/2021 Referring Physician Eugenie Norrie Primary Physician Mortimer Fries Primary Cardiologist Lujean Amel Reason for Consultation heart failure  HPI: The patient is an 81 year old male with a past medical history significant for HFpEF (LVEF 50-55%), ischemic cardiomyopathy s/p ICD placement 09/2017, CAD s/p DES x3, history of thoracic aortic aneurysm s/p fenestrated repair 2015, hypertension, hypercholesterolemia, CKD 4, tobacco use who presented to [facility] ED 04/27/2021 with shortness of breath.  Cardiology is consulted for management of his heart failure.  Interval history: -1.5L reported output, patient states he feels his leg swelling continues to improved but no significant change in how he feels otherwise -denies chest pain, palpitations, dizziness, worsening SOB -has not been OOB much other than to use the restroom.   Review of systems complete and found to be negative unless listed above     Past Medical History:  Diagnosis Date   Anginal pain ([place])    Asthma    Bladder infection, acute 03/2011   "had a whole lot of bleeding from this"   CHF (congestive heart failure) ([place])    Coronary artery disease    High cholesterol    Hypertension    Macular degeneration 12/14/2011   "had it in my right; getting shots now in my left"   Myocardial infarction [facility]) 1996   NSTEMI (non-ST elevated myocardial infarction) ([place]) 12/14/2011   Shortness of breath 12/14/2011   "@ rest, lying down, w/exertion"    Past Surgical History:  Procedure Laterality Date   [place]; ~ 2003; ~ 2008   "total of 3"   CORONARY STENT INTERVENTION N/A 08/10/2016   Procedure: Coronary Stent Intervention;  Surgeon: [name], MD;  Location: [place] CV LAB;  Service: Cardiovascular;  Laterality: N/A;   HERNIA REPAIR  ~ 2001   "abdominal w/mesh  implanted"   LEFT HEART CATH AND CORONARY ANGIOGRAPHY N/A 08/10/2016   Procedure: Left Heart Cath and Coronary Angiography;  Surgeon: [name], MD;  Location: [place] CV LAB;  Service: Cardiovascular;  Laterality: N/A;   LEFT HEART CATHETERIZATION WITH CORONARY ANGIOGRAM N/A 12/16/2011   Procedure: LEFT HEART CATHETERIZATION WITH CORONARY ANGIOGRAM;  Surgeon: Minus Breeding, MD;  Location: [facility] CATH LAB;  Service: Cardiovascular;  Laterality: N/A;   PERCUTANEOUS CORONARY STENT INTERVENTION (PCI-S) N/A 12/17/2011   Procedure: PERCUTANEOUS CORONARY STENT INTERVENTION (PCI-S);  Surgeon: Sherren Mocha, MD;  Location: [facility] CATH LAB;  Service: Cardiovascular;  Laterality: N/A;   TONSILLECTOMY     "I was a kid"    (Not in a hospital admission)  Social History   Socioeconomic History   Marital status: Widowed    Spouse name: Not on file   Number of children: Not on file   Years of education: Not on file   Highest education level: Not on file  Occupational History   Not on file  Tobacco Use   Smoking status: Former    Packs/day: 0.50    Years: 0.50    Pack years: 0.25    Types: Cigarettes   Smokeless tobacco: Never   Tobacco comments:    11/23/16 Still not ready to quit smoking.  Substance and Sexual Activity   Alcohol use: Yes    Alcohol/week: 1.0 standard drink    Types: 1 Cans of beer per week    Comment: 12/14/2011 "if my kidneys are sore, I drink 1 beer/day; if not  sore; no beer; occasionally drink socially; ave 1 beer/wk maybe"   Drug use: No   Sexual activity: Not Currently  Other Topics Concern   Not on file  Social History Narrative   Not on file   Social Determinants of Health   Financial Resource Strain: Not on file  Food Insecurity: Not on file  Transportation Needs: Not on file  Physical Activity: Not on file  Stress: Not on file  Social Connections: Not on file  Intimate Partner Violence: Not on file    Family History  Family history unknown: Yes       Review of systems complete and found to be negative unless listed above    PHYSICAL EXAM General: Elderly caucasian male, well nourished, in no acute distress. Laying nearly flat in ED bed.  HEENT:  Normocephalic and atraumatic. Neck:  No JVD.  Lungs: Normal respiratory effort on 2L by [place]. Clear bilaterally to auscultation.  No wheezes crackles, rhonchi.  Heart: HRRR . Normal S1 and S2 without gallops or murmurs. Radial & DP pulses 2+ bilaterally. Abdomen: Non-distended appearing.  Msk: Normal strength and tone for age. Extremities: No clubbing, cyanosis or edema.   Neuro: Alert and oriented X 3. Psych:  Mood appropriate, affect congruent.   Labs:   Lab Results  Component Value Date   WBC 6.3 04/27/2021   HGB 10.9 (L) 04/27/2021   HCT 33.1 (L) 04/27/2021   MCV 90.9 04/27/2021   PLT 144 (L) 04/27/2021    Recent Labs  Lab 04/29/21 0611  NA 141  K 3.6  CL 108  CO2 26  BUN 44*  CREATININE 2.81*  CALCIUM 8.1*  GLUCOSE 92    Lab Results  Component Value Date   CKTOTAL 23 (L) 12/11/2013   CKMB 1.0 12/11/2013   TROPONINI 0.05 (HH) 12/21/2017     Lab Results  Component Value Date   CHOL 108 08/08/2016   CHOL 141 12/16/2011   Lab Results  Component Value Date   HDL 36 (L) 08/08/2016   HDL 38 (L) 12/16/2011   Lab Results  Component Value Date   LDLCALC 50 08/08/2016   LDLCALC 83 12/16/2011   Lab Results  Component Value Date   TRIG 111 08/08/2016   TRIG 102 12/16/2011   Lab Results  Component Value Date   CHOLHDL 3.0 08/08/2016   CHOLHDL 3.7 12/16/2011   No results found for: LDLDIRECT    Radiology: CT ABDOMEN PELVIS WO CONTRAST  Result Date: 04/04/2021 CLINICAL DATA:  Retroperitoneal hemorrhage suspected. EXAM: CT ABDOMEN AND PELVIS WITHOUT CONTRAST TECHNIQUE: Multidetector CT imaging of the abdomen and pelvis was performed following the standard protocol without IV contrast. COMPARISON:  01/29/2016 FINDINGS: Lower chest: Bibasilar atelectasis  noted with tiny bilateral pleural effusions. Hepatobiliary: No focal abnormality in the liver on this study without intravenous contrast. Layering tiny gallstones evident. No intrahepatic or extrahepatic biliary dilation. Pancreas: No focal mass lesion. No dilatation of the main duct. No intraparenchymal cyst. No peripancreatic edema. Spleen: No splenomegaly. No focal mass lesion. Adrenals/Urinary Tract: No adrenal nodule or mass. Small cyst noted upper pole left kidney. No evidence for hydroureter. The urinary bladder appears normal for the degree of distention. Stomach/Bowel: Stomach is unremarkable. No gastric wall thickening. No evidence of outlet obstruction. Duodenum is normally positioned as is the ligament of Treitz. No small bowel wall thickening. No small bowel dilatation. The terminal ileum is normal. The appendix is normal. No gross colonic mass. No colonic wall thickening. Diverticular changes are  noted in the left colon without evidence of diverticulitis. 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 Low-attenuation left para-aortic lymph nodes (image 32/2) are stable since 2017 consistent with benign etiology. No pelvic sidewall lymphadenopathy. Reproductive: The prostate gland and seminal vesicles are unremarkable. Other: No intraperitoneal free fluid. Musculoskeletal: Multiple supraumbilical ventral hernias identified, containing only fat. Stable bone island left anterior pubic ramus. No worrisome lytic or sclerotic osseous abnormality. IMPRESSION: 1. No acute findings in the abdomen or pelvis. Specifically, no findings to suggests retroperitoneal hemorrhage. 2. 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 3. No change in the left para-aortic lymphadenopathy consistent with benign/reactive etiology. 4. Cholelithiasis. 5. Multiple supraumbilical  ventral hernias containing only fat. Electronically Signed   By: Misty Stanley M.D.   On: 04/04/2021 07:36   DG Chest 2 View  Result Date: 04/27/2021 CLINICAL DATA:  Chest pain. EXAM: CHEST - 2 VIEW COMPARISON:  None. FINDINGS: Stable position of cardiac pacemaker.  Aortic stent graft noted. Tortuosity and calcific atherosclerotic disease of the aorta. Cardiomediastinal silhouette is normal. Mediastinal contours appear intact. There is no evidence of focal airspace consolidation, pleural effusion or pneumothorax. Probable bilateral small pleural effusions. Osseous structures are without acute abnormality. Soft tissues are grossly normal. IMPRESSION: Probable bilateral small pleural effusions. Electronically Signed   By: Fidela Salisbury M.D.   On: 04/27/2021 13:45   DG Chest 2 View  Result Date: 04/11/2021 CLINICAL DATA:  Shortness of breath EXAM: CHEST - 2 VIEW COMPARISON:  04/03/2021 FINDINGS: No focal consolidation. No pleural effusion or pneumothorax. Heart and mediastinal contours are unremarkable. Dual lead cardiac pacemaker. No acute osseous abnormality. IMPRESSION: No active cardiopulmonary disease. Electronically Signed   By: Kathreen Devoid M.D.   On: 04/11/2021 15:11   DG Chest 2 View  Result Date: 04/03/2021 CLINICAL DATA:  sob EXAM: CHEST - 2 VIEW COMPARISON:  Chest x-ray 09/07/2016, CT chest 12/14/2011. FINDINGS: The heart and mediastinal contours are unchanged. Coronary artery stent. Aortic calcification. The distal descending thoracic aorta/suprarenal abdominal aorta stent noted on lateral view. Three lead cardiac pacemaker/defibrillator overlies the left chest wall. No focal consolidation. No pulmonary edema. Blunting of bilateral costophrenic angles. No pneumothorax. No acute osseous abnormality. IMPRESSION: Blunting of bilateral costophrenic angles. Trace pleural effusions not excluded. Electronically Signed   By: Iven Finn M.D.   On: 04/03/2021 22:56   CT CHEST WO  CONTRAST  Result Date: 04/12/2021 CLINICAL DATA:  Shortness of breath. EXAM: CT CHEST WITHOUT CONTRAST TECHNIQUE: Multidetector CT imaging of the chest was performed following the standard protocol without IV contrast. COMPARISON:  December 14, 2011 FINDINGS: Cardiovascular: A dual lead AICD is in place. There is moderate to marked severity calcification of the aortic arch and descending thoracic aorta. 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 A stent is noted within the distal aspect of the descending thoracic aorta. This extends to include the visualized portion of the abdominal aorta. Normal heart size with coronary artery stents in place. No pericardial effusion. Mediastinum/Nodes: There is mild AP window and pretracheal lymphadenopathy. Thyroid gland, trachea, and esophagus demonstrate no significant findings. Lungs/Pleura: There is mild emphysematous lung disease involving the bilateral upper lobes. Mild areas of linear scarring and/or atelectasis are noted within the posterior aspects of the bilateral lung bases. There are very small bilateral pleural effusions. No pneumothorax is identified. Upper Abdomen: A thin layer  of tiny gallstones is seen within the dependent portion of the gallbladder. Vascular stents are seen within the origins of the celiac artery, superior mesenteric artery and bilateral renal arteries. Musculoskeletal: Multilevel degenerative changes seen throughout the thoracic spine. IMPRESSION: 1. Mild emphysematous lung disease. 2. Mild bibasilar linear scarring and/or atelectasis. 3. Very small bilateral pleural effusions. 4. Extensive stenting of the distal descending thoracic aorta, as well as the abdominal aorta and several of its branches. 5. Cholelithiasis. Aortic Atherosclerosis (ICD10-I70.0) and Emphysema (ICD10-J43.9). Electronically Signed   By: Virgina Norfolk M.D.   On: 04/12/2021 04:11   NM Pulmonary Perfusion  Result Date: 04/12/2021 CLINICAL DATA:   Evaluate for pulmonary embolus. Shortness of breath. EXAM: NUCLEAR MEDICINE PERFUSION LUNG SCAN TECHNIQUE: Perfusion images were obtained in multiple projections after intravenous injection of radiopharmaceutical. Ventilation scans intentionally deferred if perfusion scan and chest x-ray adequate for interpretation during COVID 19 epidemic. RADIOPHARMACEUTICALS:  4.3 mCi Tc-72m MAA IV COMPARISON:  Chest radiograph 04/11/21 FINDINGS: On the chest radiograph from 04/11/2021 there is a ICD overlying the left chest wall. No focal pulmonary opacities identified. On the perfusion portion of the examination there is a focal peripheral defect overlying the left upper lobe corresponding to the ICD battery pack. Mild heterogeneous distribution of the radiopharmaceutical is noted within both lungs. On the lateral projection images there are large nonsegmental areas of diminished activity within both posterior and upper lung zones which likely represents non-uniform soft tissue attenuation artifact secondary to patient's upper extremities. No peripheral segmental perfusion defects to suggest acute pulmonary embolus. IMPRESSION: 1. No evidence for acute pulmonary embolus. Electronically Signed   By: Kerby Moors M.D.   On: 04/12/2021 11:01   US RENAL  Result Date: 04/08/2021 CLINICAL DATA:  Renal dysfunction. EXAM: RENAL / URINARY TRACT ULTRASOUND COMPLETE COMPARISON:  Abdominal sonogram done on 06/20/2015 FINDINGS: Right Kidney: Renal measurements: 10.9 x 5.3 x 5 cm = volume: 152 mL. There is no hydronephrosis. There is increased cortical echogenicity. Left Kidney: Renal measurements: 11.2 x 5.5 x 4.5 cm = volume: 147 mL. There is no hydronephrosis. There is increased cortical echogenicity. There is 2 cm cyst in the upper pole. Bladder: Appears normal for degree of bladder distention. Other: None. IMPRESSION: There is no hydronephrosis. Increased cortical echogenicity suggests medical renal disease. 2 cm left renal cyst.  Electronically Signed   By: Elmer Picker M.D.   On: 04/08/2021 16:11   US Venous Img Lower Bilateral  Result Date: 04/04/2021 CLINICAL DATA:  Bilateral lower extremity edema, dyspnea EXAM: BILATERAL LOWER EXTREMITY VENOUS DOPPLER ULTRASOUND TECHNIQUE: Gray-scale sonography with compression, as well as color and duplex ultrasound, were performed to evaluate the deep venous system(s) from the level of the common femoral vein through the popliteal and proximal calf veins. COMPARISON:  None. FINDINGS: VENOUS Normal compressibility of the common femoral, superficial femoral, and popliteal veins, as well as the visualized calf veins. Visualized portions of profunda femoral vein and great saphenous vein unremarkable. No filling defects to suggest DVT on grayscale or color Doppler imaging. Doppler waveforms show normal direction of venous flow, normal respiratory plasticity and response to augmentation. OTHER None. Limitations: none IMPRESSION: Negative. Electronically Signed   By: Fidela Salisbury M.D.   On: 04/04/2021 03:32   US Venous Img Upper Uni Left  Result Date: 04/11/2021 CLINICAL DATA:  Left upper extremity pain and swelling. EXAM: LEFT UPPER EXTREMITY VENOUS DOPPLER ULTRASOUND TECHNIQUE: Gray-scale sonography with graded compression, as well as color Doppler and duplex ultrasound were performed  to evaluate the upper extremity deep venous system from the level of the subclavian vein and including the jugular, axillary, basilic, radial, ulnar and upper cephalic vein. Spectral Doppler was utilized to evaluate flow at rest and with distal augmentation maneuvers. COMPARISON:  None. FINDINGS: Contralateral Subclavian Vein: Respiratory phasicity is normal and symmetric with the symptomatic side. No evidence of thrombus. Normal compressibility. Internal Jugular Vein: No evidence of thrombus. Normal compressibility, respiratory phasicity and response to augmentation. Subclavian Vein: No evidence of  thrombus. Normal compressibility, respiratory phasicity and response to augmentation. Axillary Vein: No evidence of thrombus. Normal compressibility, respiratory phasicity and response to augmentation. Cephalic Vein: No evidence of thrombus. Normal compressibility, respiratory phasicity and response to augmentation. Basilic Vein: No evidence of thrombus. Normal compressibility, respiratory phasicity and response to augmentation. Brachial Veins: No evidence of thrombus. Normal compressibility, respiratory phasicity and response to augmentation. Radial Veins: No evidence of thrombus. Normal compressibility, respiratory phasicity and response to augmentation. Ulnar Veins: No evidence of thrombus. Normal compressibility, respiratory phasicity and response to augmentation. Venous Reflux:  None visualized. Other Findings:  None visualized. IMPRESSION: No evidence of DVT within the LEFT upper extremity. Electronically Signed   By: Virgina Norfolk M.D.   On: 04/11/2021 20:25   ECHOCARDIOGRAM COMPLETE  Result Date: 04/06/2021    ECHOCARDIOGRAM REPORT   Patient Name:   DAMACIO WEISGERBER Date of Exam: 04/04/2021 Medical Rec #:  [id number]        Height:       70.0 in Accession #:    [id number]       Weight:       220.0 lb Date of Birth:  1940/05/27       BSA:          2.174 m Patient Age:    35 years         BP:           198/65 mmHg Patient Gender: M                HR:           89 bpm. Exam Location:  [facility] Procedure: 2D Echo, Cardiac Doppler and Color Doppler Indications:     Elevated Troponin  History:         Patient has prior history of Echocardiogram examinations, most                  recent 08/08/2016. CHF, Previous Myocardial Infarction; Risk                  Factors:Hypertension.  Sonographer:     Sherrie Sport Referring Phys:  [id number] Diagnosing Phys: Yolonda Kida MD  Sonographer Comments: Suboptimal parasternal window. IMPRESSIONS  1. Left ventricular ejection fraction, by estimation, is 50 to  55%. The left ventricle has low normal function. The left ventricle has no regional wall motion abnormalities. The left ventricular internal cavity size was mildly dilated. There is mild left ventricular hypertrophy. Left ventricular diastolic parameters are consistent with Grade II diastolic dysfunction (pseudonormalization).  2. Right ventricular systolic function is low normal. The right ventricular size is moderately enlarged.  3. The mitral valve is normal in structure. Mild mitral valve regurgitation.  4. The aortic valve is normal in structure. Aortic valve regurgitation is mild. FINDINGS  Left Ventricle: Left ventricular ejection fraction, by estimation, is 50 to 55%. The left ventricle has low normal function. The left ventricle has no regional wall motion abnormalities. The left ventricular  internal cavity size was mildly dilated. There is mild left ventricular hypertrophy. Left ventricular diastolic parameters are consistent with Grade II diastolic dysfunction (pseudonormalization). Right Ventricle: The right ventricular size is moderately enlarged. No increase in right ventricular wall thickness. Right ventricular systolic function is low normal. Left Atrium: Left atrial size was normal in size. Right Atrium: Right atrial size was normal in size. Pericardium: There is no evidence of pericardial effusion. Mitral Valve: The mitral valve is normal in structure. Mild mitral valve regurgitation. Tricuspid Valve: The tricuspid valve is normal in structure. Tricuspid valve regurgitation is mild. Aortic Valve: The aortic valve is normal in structure. Aortic valve regurgitation is mild. Aortic valve mean gradient measures 4.0 mmHg. Aortic valve peak gradient measures 6.7 mmHg. Aortic valve area, by VTI measures 3.09 cm. Pulmonic Valve: The pulmonic valve was normal in structure. Pulmonic valve regurgitation is not visualized. Aorta: The ascending aorta was not well visualized. IAS/Shunts: No atrial level shunt  detected by color flow Doppler. Additional Comments: A device lead is visualized. There is no pleural effusion.  LEFT VENTRICLE PLAX 2D LVIDd:         4.90 cm   Diastology LVIDs:         3.30 cm   LV e' medial:    7.07 cm/s LV PW:         1.50 cm   LV E/e' medial:  14.0 LV IVS:        1.05 cm   LV e' lateral:   8.27 cm/s LVOT diam:     2.10 cm   LV E/e' lateral: 12.0 LV SV:         86 LV SV Index:   40 LVOT Area:     3.46 cm  RIGHT VENTRICLE RV Basal diam:  4.40 cm RV S prime:     14.40 cm/s TAPSE (M-mode): 4.7 cm LEFT ATRIUM           Index        RIGHT ATRIUM           Index LA diam:      2.90 cm 1.33 cm/m   RA Area:     27.00 cm LA Vol (A2C): 45.6 ml 20.98 ml/m  RA Volume:   85.90 ml  39.52 ml/m LA Vol (A4C): 56.1 ml 25.81 ml/m  AORTIC VALVE                    PULMONIC VALVE AV Area (Vmax):    3.01 cm     PV Vmax:        0.70 m/s AV Area (Vmean):   3.08 cm     PV Vmean:       44.100 cm/s AV Area (VTI):     3.09 cm     PV VTI:         0.107 m AV Vmax:           129.00 cm/s  PV Peak grad:   1.9 mmHg AV Vmean:          88.400 cm/s  PV Mean grad:   1.0 mmHg AV VTI:            0.279 m      RVOT Peak grad: 3 mmHg AV Peak Grad:      6.7 mmHg AV Mean Grad:      4.0 mmHg LVOT Vmax:         112.00 cm/s LVOT Vmean:  78.700 cm/s LVOT VTI:          0.249 m LVOT/AV VTI ratio: 0.89  AORTA Ao Root diam: 3.40 cm MITRAL VALVE               TRICUSPID VALVE MV Area (PHT): 4.83 cm    TR Peak grad:   40.4 mmHg MV Decel Time: 157 msec    TR Vmax:        318.00 cm/s MV E velocity: 99.00 cm/s MV A velocity: 87.00 cm/s  SHUNTS MV E/A ratio:  1.14        Systemic VTI:  0.25 m                            Systemic Diam: 2.10 cm                            Pulmonic VTI:  0.149 m Yolonda Kida MD Electronically signed by Yolonda Kida MD Signature Date/Time: 04/06/2021/11:32:38 AM    Final     ECHO 04/04/2021 LVEF 50-55% without wall motion abnormalities, mild LV dilation, mild LVH, grade 2 diastolic dysfunction, mild  MR, mild AR  TELEMETRY reviewed by me: NSR LBBB PVCs rate 80  EKG reviewed by me: Normal sinus rhythm, LBBB, multiform PVCs rate 78  ASSESSMENT AND PLAN:  The patient is an 81 year old male with a past medical history significant for HFpEF (LVEF 50-55%) 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  Cardiology is consulted for management of his heart failure.  #Acute on chronic HFpEF (LVEF 50-55%) / ICM s/p ICD placement 09/2017 #acute exacerbation of COPD #PVCs #Elevated troponin d/t demand ischemia #AKI on CKD 4 The patient was recently hospitalized from 12/22 - 12/28 and finished his rehab stay on the day of admission 1/15. He was not started back on his lasix at discharge due to an AKI and presents to the hospital today slightly volume overloaded and SOB. The pt states he feels like not being on supplemental oxygen is what is making him SOB. There is a likely a dietary indiscretion component to his current presentation also. Additionally, the pt's blood pressure was severely elevated on admission and troponins are elevated too likely as a result. He denies chest pain.  -S/p IV Lasix 40 mg x 2 with good UOP. Continue 40mg  IV lasix for today and likely transition to PO tomorrow.  -Will restart spironolactone today -Agree with monitoring and replenishing electrolytes as needed. -renal function improving  -agree with pulmonology who recommends duonebs, prednisone, and azithromycin for COPD exacerbation  #Hypertensive urgency  #CAD s/p DES x3 Agree with hydralazine 100 mg every 8 hours, blood pressure labile Home medication regimen includes amlodipine 5 mg, carvedilol 6.25 mg once daily  #Dyslipidemia Continue high intensity statin Lipitor 40 mg once daily  This patient's plan of care was  discussed and created with Dr. Serafina Royals and he is in agreement.  Signed: Tristan Schroeder , PA-C 04/29/2021, 12:13 PM

## 2021-04-29 NOTE — Progress Notes (Signed)
PROGRESS NOTE    Peter [name]  ZOX:[id number] DOB: 15-Feb-1941 DOA: 04/27/2021 PCP: Tracie Harrier, MD   Brief Narrative: 81 year old with past medical history significant for systolic and diastolic heart failure, CAD, hypertension, dyslipidemia, non-STEMI status post PCI on steroids, status post AICD who presents to the ED with acute onset of worsening shortness of breath.  He describes orthopnea, paroxysmal nocturnal dyspnea, exertional dyspnea.  Recent admission 12/22 until 12/28 for symptomatic anemia, received blood transfusion, treatment for heart failure exacerbation with diuresis.  His Lasix was discontinued at discharge due to AKI on CKD stage IV.  He has not been taking Lasix since her last hospitalization.  AICD was interrogated by ER physician and show increased PVCs.  Evaluation in the ED patient was noted to be tensive systolic blood pressure 811, tachypneic respiration rate 25, BUN 40, creatinine 2.8 prior creatinine 3.2 on 9/9.  Last admission creatinine at discharge was 3.1.  BNP elevated 2988.  Troponin mildly elevated to 253--- 259. Chest x-ray showed bilateral small pleural effusion.  Patient admitted for further evaluation and treatment of heart failure exacerbation.    Assessment & Plan:   Principal Problem:   Acute exacerbation of CHF (congestive heart failure) (HCC)  1-Acute on chronic diastolic heart failure exacerbation: His systolic heart failure has resolved since 2D echo 04/04/2021.  Last ejection fraction 50 to 55%. -Continue with IV Lasix. -Hold spironolactone and ACE due to CKD. -Nephrology as well to assist with diuresis in the setting of CKD stage IV -Appreciate cardiology assistance. -Negative 1.5 L negative. Urine out put 1.2 L for last 24 hours.  -Patient will likely require home oxygen. Evaluate for Home oxygen needs.  -Dyspnea improved.   2-Hypertensive urgency:  Continue with Coreg, hydralazine- increased Imdur dose.  Will hold Cozaar  and Aldactone due to CKD stage IV for now. Started on Norvasc.  BP controlled improving.   3-Elevation of troponin: In the setting of heart failure exacerbation.  Likely demand ischemia. Continue Imdur Coreg. He was a started on a baby aspirin. Cardiology  consulted and following.  4-AKI on CKD stage IV;  Baseline cr 2.8. Peak to 3.0 Concern for cardiorenal syndrome.  Continue with IV lasix.  Nephrology consulted. Cr down to 2.8 improved.   Multifocal PVCs: Status post AICD.  Suspicious AICD discharge night of admission. Cardiology consulted and following Received IV magnesium   5-Hyperlipidemia: Continue with a statin  6-BPH: Continue with Proscar and Flomax 7--Mild emphysematous lung disease, seen on CT scan last admission, He was Supposed to have appointment office today with pulmonologist.  DuoNebs. Dr. Keenan Bachelor recommend PRN duoneb, Low dose prednisone, azithromycin.      Estimated body mass index is 28.59 kg/m as calculated from the following:   Height as of this encounter: 5\' 11"  (1.803 m).   Weight as of this encounter: 93 kg.   DVT prophylaxis: Lovenox Code Status: Full code Family Communication: Care discussed with patient, significant other updated at bedside 1/16. Disposition Plan:  Status is: Inpatient  Remains inpatient appropriate because: Patient need admission for IV diuresis due to heart failure exacerbation, AKI secondary to cardiorenal syndrome.        Consultants:  Cardiology   Procedures:  none  Antimicrobials:    Subjective: He is feeling better, report improvement of dyspnea. Not at baseline.   Objective: Vitals:   04/29/21 0600 04/29/21 0825 04/29/21 1000 04/29/21 1200  BP: (!) 179/63 (!) 162/68 (!) 139/52 (!) 156/64  Pulse: 66 71  69  Resp:  20 18  18   Temp: 98.1 F (36.7 C) 98.3 F (36.8 C)    TempSrc:  Oral    SpO2: 97% 97%  97%  Weight:      Height:        Intake/Output Summary (Last 24 hours) at 04/29/2021  1412 Last data filed at 04/29/2021 1307 Gross per 24 hour  Intake --  Output 1200 ml  Net -1200 ml    Filed Weights   04/27/21 1300  Weight: 93 kg    Examination:  General exam: NAD Respiratory system: BL crackles.  Cardiovascular system: S,1 S  2 RRR positive JVD Gastrointestinal system: BS present, soft, nt Central nervous system: Alert, follows command Extremities: Symmetric power.      Data Reviewed: I have personally reviewed following labs and imaging studies  CBC: Recent Labs  Lab 04/27/21 1305 04/27/21 2246  WBC 7.0 6.3  HGB 11.4* 10.9*  HCT 35.0* 33.1*  MCV 89.7 90.9  PLT 168 144*    Basic Metabolic Panel: Recent Labs  Lab 04/27/21 1305 04/27/21 2246 04/28/21 0611 04/29/21 0611  NA 138  --  142 141  K 4.0   3.9  --  3.5 3.6  CL 108  --  109 108  CO2 24  --  24 26  GLUCOSE 114*  --  87 92  BUN 40*  --  44* 44*  CREATININE 2.83* 3.10* 3.04* 2.81*  CALCIUM 8.2*  --  8.1* 8.1*  MG 1.8  --   --   --     GFR: Estimated Creatinine Clearance: 24 mL/min (A) (by C-G formula based on SCr of 2.81 mg/dL (H)). Liver Function Tests: No results for input(s): AST, ALT, ALKPHOS, BILITOT, PROT, ALBUMIN in the last 168 hours. No results for input(s): LIPASE, AMYLASE in the last 168 hours. No results for input(s): AMMONIA in the last 168 hours. Coagulation Profile: No results for input(s): INR, PROTIME in the last 168 hours. Cardiac Enzymes: No results for input(s): CKTOTAL, CKMB, CKMBINDEX, TROPONINI in the last 168 hours. BNP (last 3 results) No results for input(s): PROBNP in the last 8760 hours. HbA1C: No results for input(s): HGBA1C in the last 72 hours. CBG: No results for input(s): GLUCAP in the last 168 hours. Lipid Profile: No results for input(s): CHOL, HDL, LDLCALC, TRIG, CHOLHDL, LDLDIRECT in the last 72 hours. Thyroid Function Tests: No results for input(s): TSH, T4TOTAL, FREET4, T3FREE, THYROIDAB in the last 72 hours. Anemia Panel: No  results for input(s): VITAMINB12, FOLATE, FERRITIN, TIBC, IRON, RETICCTPCT in the last 72 hours. Sepsis Labs: No results for input(s): PROCALCITON, LATICACIDVEN in the last 168 hours.  Recent Results (from the past 240 hour(s))  Resp Panel by RT-PCR (Flu A&B, Covid) Nasopharyngeal Swab     Status: None   Collection Time: 04/22/21  2:26 AM   Specimen: Nasopharyngeal Swab; Nasopharyngeal(NP) swabs in vial transport medium  Result Value Ref Range Status   SARS Coronavirus 2 by RT PCR NEGATIVE NEGATIVE Final    Comment: (NOTE) SARS-CoV-2 target nucleic acids are NOT DETECTED.  The SARS-CoV-2 RNA is generally detectable in upper respiratory specimens during the acute phase of infection. The lowest concentration of SARS-CoV-2 viral copies this assay can detect is 138 copies/mL. A negative result does not preclude SARS-Cov-2 infection and should not be used as the sole basis for treatment or other patient management decisions. A negative result may occur with  improper specimen collection/handling, submission of specimen other than nasopharyngeal swab, presence of viral mutation(s)  within the areas targeted by this assay, and inadequate number of viral copies(<138 copies/mL). A negative result must be combined with clinical observations, patient history, and epidemiological information. The expected result is Negative.  Fact Sheet for Patients:  EntrepreneurPulse.com.au  Fact Sheet for Healthcare Providers:  IncredibleEmployment.be  This test is no t yet approved or cleared by the [place] FDA and  has been authorized for detection and/or diagnosis of SARS-CoV-2 by FDA under an Emergency Use Authorization (EUA). This EUA will remain  in effect (meaning this test can be used) for the duration of the COVID-19 declaration under Section 564(b)(1) of the Act, 21 U.S.C.section 360bbb-3(b)(1), unless the authorization is terminated  or revoked sooner.        Influenza A by PCR NEGATIVE NEGATIVE Final   Influenza B by PCR NEGATIVE NEGATIVE Final    Comment: (NOTE) The Xpert Xpress SARS-CoV-2/FLU/RSV plus assay is intended as an aid in the diagnosis of influenza from Nasopharyngeal swab specimens and should not be used as a sole basis for treatment. Nasal washings and aspirates are unacceptable for Xpert Xpress SARS-CoV-2/FLU/RSV testing.  Fact Sheet for Patients: EntrepreneurPulse.com.au  Fact Sheet for Healthcare Providers: IncredibleEmployment.be  This test is not yet approved or cleared by the [place] FDA and has been authorized for detection and/or diagnosis of SARS-CoV-2 by FDA under an Emergency Use Authorization (EUA). This EUA will remain in effect (meaning this test can be used) for the duration of the COVID-19 declaration under Section 564(b)(1) of the Act, 21 U.S.C. section 360bbb-3(b)(1), unless the authorization is terminated or revoked.  Performed at [facility], [address] [place], [place] [address]   Resp Panel by RT-PCR (Flu A&B, Covid) Nasopharyngeal Swab     Status: None   Collection Time: 04/27/21  6:30 PM   Specimen: Nasopharyngeal Swab; Nasopharyngeal(NP) swabs in vial transport medium  Result Value Ref Range Status   SARS Coronavirus 2 by RT PCR NEGATIVE NEGATIVE Final    Comment: (NOTE) SARS-CoV-2 target nucleic acids are NOT DETECTED.  The SARS-CoV-2 RNA is generally detectable in upper respiratory specimens during the acute phase of infection. The lowest concentration of SARS-CoV-2 viral copies this assay can detect is 138 copies/mL. A negative result does not preclude SARS-Cov-2 infection and should not be used as the sole basis for treatment or other patient management decisions. A negative result may occur with  improper specimen collection/handling, submission of specimen other than nasopharyngeal swab, presence of viral mutation(s)  within the areas targeted by this assay, and inadequate number of viral copies(<138 copies/mL). A negative result must be combined with clinical observations, patient history, and epidemiological information. The expected result is Negative.  Fact Sheet for Patients:  EntrepreneurPulse.com.au  Fact Sheet for Healthcare Providers:  IncredibleEmployment.be  This test is no t yet approved or cleared by the [place] FDA and  has been authorized for detection and/or diagnosis of SARS-CoV-2 by FDA under an Emergency Use Authorization (EUA). This EUA will remain  in effect (meaning this test can be used) for the duration of the COVID-19 declaration under Section 564(b)(1) of the Act, 21 U.S.C.section 360bbb-3(b)(1), unless the authorization is terminated  or revoked sooner.       Influenza A by PCR NEGATIVE NEGATIVE Final   Influenza B by PCR NEGATIVE NEGATIVE Final    Comment: (NOTE) The Xpert Xpress SARS-CoV-2/FLU/RSV plus assay is intended as an aid in the diagnosis of influenza from Nasopharyngeal swab specimens and should not be used as a  sole basis for treatment. Nasal washings and aspirates are unacceptable for Xpert Xpress SARS-CoV-2/FLU/RSV testing.  Fact Sheet for Patients: EntrepreneurPulse.com.au  Fact Sheet for Healthcare Providers: IncredibleEmployment.be  This test is not yet approved or cleared by the [place] FDA and has been authorized for detection and/or diagnosis of SARS-CoV-2 by FDA under an Emergency Use Authorization (EUA). This EUA will remain in effect (meaning this test can be used) for the duration of the COVID-19 declaration under Section 564(b)(1) of the Act, 21 U.S.C. section 360bbb-3(b)(1), unless the authorization is terminated or revoked.  Performed at [facility], [address], [place], [place] [address]           Radiology Studies: No  results found.      Scheduled Meds:  amLODipine  10 mg Oral Daily   atorvastatin  40 mg Oral q1800   azithromycin  250 mg Oral Daily   carvedilol  6.25 mg Oral BID WC   clobetasol cream   Topical BID   clopidogrel  75 mg Oral Daily   enoxaparin (LOVENOX) injection  30 mg Subcutaneous Q24H   finasteride  5 mg Oral Daily   furosemide  40 mg Intravenous Q12H   hydrALAZINE  100 mg Oral Q8H   isosorbide mononitrate  60 mg Oral Daily   loratadine  10 mg Oral Daily   pantoprazole  40 mg Oral Daily   potassium chloride  40 mEq Oral Once   predniSONE  10 mg Oral Q breakfast   sodium chloride flush  3 mL Intravenous Q12H   spironolactone  25 mg Oral Daily   tamsulosin  0.4 mg Oral Daily   cyanocobalamin  1,000 mcg Oral Daily   Continuous Infusions:  sodium chloride       LOS: 2 days    Time spent: 35 minutes    Dazha Kempa A Jaquarious Grey, MD [facility]   If 7PM-7AM, please contact night-coverage [email or web address]  04/29/2021, 2:12 PM

## 2021-04-29 NOTE — ED Notes (Signed)
Removed patient's peripheral IV due to redness along anterior forearm. Patient denied pain. No fluids or current medications flowing through peripheral IV- has been used as saline lock. May need to consult IV team for replacement due to edema in both arms.

## 2021-04-29 NOTE — Consult Note (Signed)
° °  Heart Failure Nurse Navigator Note  HFpEF 50 to 55%.  Mild LVH.  Grade 2 diastolic dysfunction.  Ventricular systolic function is low normal.  Mild mitral regurgitation, mild aortic regurgitation.  He presented from home with complaints of shortness of breath.  He also noted orthopnea and PND.  He was last admitted from December 22 to 28 for symptomatic anemia.  Comorbidities:  Asthma Coronary artery disease with stenting Hyperlipidemia Hypertension Chronic kidney disease stage IV   Medications: Spironolactone 25 mg daily Amlodipine 10 mg daily Isosorbide mononitrate 60 mg daily Atorvastatin 40 mg daily Carvedilol 6.25 mg twice a day Hydralazine 100 mg every 8 hours Plavix 75 mg daily Torsemide 40 mg IV every 12  Labs:   Sodium 141, potassium 3.6, chloride 108, CO2 26, BUN 44, creatinine 2.8 Chest x-ray revealed bilateral pleural effusions and pulmonary edema.  NP was elevated at 2988   Initial meeting with patient, while in the ED.  Lying on a gurney with O2 per nasal cannula in no acute distress.  He is a retired Insurance underwriter.  He currently lives by himself and prepares his own meals.  He asked that his daughter [name] be included in this conversation and he was able to get her on the phone.  Discussed the importance of low-sodium diet went over examples of what not to eat and what is allowed on his diet.-States that she has become more involved in preparation of his meals and have made some meals to be placed in the freezer also recently has made him chicken salad with the allotted amount of mayonnaise.  Also discussed removing the saltshaker from the table.  He states that he also uses a salt substitute that contains potassium chloride, advised the 2 of them the with the potassium chloride and his kidney function that has not the ideal thing for him to be using recommended Mrs. Dash or natural spices or lemon or vinegar.  Also discussed the importance of daily weights, to record  and what to report to physician's.  Also went over changes in symptoms to report.  He was given the living with heart failure teaching booklet, zone magnet and information on low-sodium.  Will continue to follow along.  Pricilla Riffle RN [facility]

## 2021-04-29 NOTE — Consult Note (Signed)
PULMONOLOGY         Date: 04/29/2021,   MRN# [id number] Peter [name] [birth year]     AdmissionWeight: 93 kg                 CurrentWeight: 93 kg   Referring physician: Dr Tyrell Antonio   CHIEF COMPLAINT:   Acute hypoxemic respiratory failure   HISTORY OF PRESENT ILLNESS   Peter [name] is a 81 y.o. Caucasian male with medical history significant for systolic and diastolic CHF, coronary artery disease, hypertension, dyslipidemia and coronary artery disease and non-STEMI status post PCI and stent, and status post AICD, who presented to the ER with acute onset of significantly worsening dyspnea today.  He was noted to have orthopnea and paroxysmal nocturnal dyspnea as well as dyspnea on exertion.  Patient was admitted here from 12/22 to 04/09/2021 for symptomatic anemia for which she received 2 units of packed red blood cells and acute on chronic diastolic CHF and was then diuresed with IV Lasix however given worsening acute kidney injury on CKD stage IV his Lasix was held off.  He stated that he is not taking Lasix since then.  He was discharged to rehabitation and has been on oxygen there.  He was discharged home from rehab today and did not have oxygen at home.  He has been otherwise compliant with all of his medications.  The patient's troponins were elevated in the 200s during his last admission.  Pulse oximetry was down to 90% on room air.  The patient felt like his defibrillator may have went off while in the waiting room.  It was interrogated by the ER physician and showed increased PVCs count.  When he came to the ER, blood pressure was 197/85 and respiratory rate 25 with a pulse symmetry 93% on room air and has gone down to 90% on ambulation.  BP later normalized up to 201/94.  BMP tonight showed a BUN of 40 and creatinine 2.83 compared to 47/3.26 on 9/9.  At discharge on 12/28 BUN was 57 and creatinine 3.13 up from 67/2.95 at the time of his admission on 12/22.  BNP was  2988.6.  High-sensitivity troponin was 253 and later 259.  CBC showed hemoglobin of 11.4 hematocrit 35 up from previous levels.  Influenza antigens and COVID-19 PCR came back negative.  The patient was given 40 mg of IV Lasix.  He will be admitted to a cardiac telemetry bed for further evaluation and management.    PAST MEDICAL HISTORY   Past Medical History:  Diagnosis Date   Anginal pain ([place])    Asthma    Bladder infection, acute 03/2011   "had a whole lot of bleeding from this"   CHF (congestive heart failure) ([place])    Coronary artery disease    High cholesterol    Hypertension    Macular degeneration 12/14/2011   "had it in my right; getting shots now in my left"   Myocardial infarction [facility]) 1996   NSTEMI (non-ST elevated myocardial infarction) ([place]) 12/14/2011   Shortness of breath 12/14/2011   "@ rest, lying down, w/exertion"     SURGICAL HISTORY   Past Surgical History:  Procedure Laterality Date   [place]; ~ 2003; ~ 2008   "total of 3"   CORONARY STENT INTERVENTION N/A 08/10/2016   Procedure: Coronary Stent Intervention;  Surgeon: [name], MD;  Location: [place] [facility];  Service: Cardiovascular;  Laterality: N/A;  HERNIA REPAIR  ~ 2001   "abdominal w/mesh implanted"   LEFT HEART CATH AND CORONARY ANGIOGRAPHY N/A 08/10/2016   Procedure: Left Heart Cath and Coronary Angiography;  Surgeon: [name], MD;  Location: [place] [facility];  Service: Cardiovascular;  Laterality: N/A;   LEFT HEART CATHETERIZATION WITH CORONARY ANGIOGRAM N/A 12/16/2011   Procedure: LEFT HEART CATHETERIZATION WITH CORONARY ANGIOGRAM;  Surgeon: Minus Breeding, MD;  Location: [facility] C[facility] LAB;  Service: Cardiovascular;  Laterality: N/A;   PERCUTANEOUS CORONARY STENT INTERVENTION (PCI-S) N/A 12/17/2011   Procedure: PERCUTANEOUS CORONARY STENT INTERVENTION (PCI-S);  Surgeon: Sherren Mocha, MD;  Location: [facility] CATH LAB;  Service: Cardiovascular;   Laterality: N/A;   TONSILLECTOMY     "I was a kid"     FAMILY HISTORY   Family History  Family history unknown: Yes     SOCIAL HISTORY   Social History   Tobacco Use   Smoking status: Former    Packs/day: 0.50    Years: 0.50    Pack years: 0.25    Types: Cigarettes   Smokeless tobacco: Never   Tobacco comments:    11/23/16 Still not ready to quit smoking.  Substance Use Topics   Alcohol use: Yes    Alcohol/week: 1.0 standard drink    Types: 1 Cans of beer per week    Comment: 12/14/2011 "if my kidneys are sore, I drink 1 beer/day; if not sore; no beer; occasionally drink socially; ave 1 beer/wk maybe"   Drug use: No     MEDICATIONS    Home Medication:  Current Outpatient Rx   Order #: [id number] Class: Normal   Order #: [id number] Class: Historical Med   Order #: [id number] Class: Historical Med   Order #: [id number] Class: Historical Med   Order #: [id number] Class: Normal   Order #: [id number] Class: Historical Med   Order #: [id number] Class: Normal   Order #: [id number] Class: No Print   Order #: [id number] Class: Historical Med   Order #: [id number] Class: Normal   Order #: [id number] Class: Historical Med   Order #: [id number] Class: Historical Med   Order #: [id number] Class: Normal   Order #: [id number] Class: Historical Med   Order #: [id number] Class: No Print   Order #: [id number] Class: No Print    Current Medication:  Current Facility-Administered Medications:    0.9 %  sodium chloride infusion, 250 mL, Intravenous, PRN, Mansy, Jan A, MD   acetaminophen (TYLENOL) tablet 650 mg, 650 mg, Oral, Q4H PRN, Mansy, Jan A, MD   ALPRAZolam Duanne Moron) tablet 0.25 mg, 0.25 mg, Oral, BID PRN, Mansy, Jan A, MD   amLODipine (NORVASC) tablet 10 mg, 10 mg, Oral, Daily, Regalado, Belkys A, MD, 10 mg at 04/28/21 0903   atorvastatin (LIPITOR) tablet 40 mg, 40 mg, Oral, q1800, Mansy, Jan A, MD, 40 mg at 04/28/21 2244   carvedilol (COREG) tablet 6.25 mg, 6.25 mg, Oral, BID WC, Mansy, Jan A, MD, 6.25  mg at 04/29/21 1856   clobetasol cream (TEMOVATE) 0.05 %, , Topical, BID, Mansy, Jan A, MD   clopidogrel (PLAVIX) tablet 75 mg, 75 mg, Oral, Daily, Mansy, Jan A, MD, 75 mg at 04/28/21 0904   enoxaparin (LOVENOX) injection 30 mg, 30 mg, Subcutaneous, Q24H, Mansy, Jan A, MD, 30 mg at 04/28/21 2247   finasteride (PROSCAR) tablet 5 mg, 5 mg, Oral, Daily, Mansy, Jan A, MD, 5 mg at 04/28/21 0904   furosemide (LASIX) injection 40 mg, 40 mg, Intravenous, Q12H, Mansy, Jan A, MD, 40 mg at 04/29/21 0833   hydrALAZINE (APRESOLINE) tablet 100 mg, 100  mg, Oral, Q8H, Mansy, Jan A, MD, 100 mg at 04/29/21 0867   isosorbide mononitrate (IMDUR) 24 hr tablet 60 mg, 60 mg, Oral, Daily, Regalado, Belkys A, MD, 60 mg at 04/28/21 0904   labetalol (NORMODYNE) injection 20 mg, 20 mg, Intravenous, Q3H PRN, Mansy, Jan A, MD, 20 mg at 04/27/21 2117   loratadine (CLARITIN) tablet 10 mg, 10 mg, Oral, Daily, Mansy, Jan A, MD, 10 mg at 04/28/21 0905   nitroGLYCERIN (NITROGLYN) 2 % ointment 1 inch, 1 inch, Topical, Q6H PRN, Mansy, Jan A, MD, 1 inch at 04/27/21 2300   ondansetron (ZOFRAN) injection 4 mg, 4 mg, Intravenous, Q6H PRN, Mansy, Jan A, MD   pantoprazole (PROTONIX) EC tablet 40 mg, 40 mg, Oral, Daily, Mansy, Jan A, MD, 40 mg at 04/28/21 6195   potassium chloride (KLOR-CON) packet 40 mEq, 40 mEq, Oral, Once, Mansy, Jan A, MD   sodium chloride flush (NS) 0.9 % injection 3 mL, 3 mL, Intravenous, Q12H, Mansy, Jan A, MD, 3 mL at 04/28/21 2244   sodium chloride flush (NS) 0.9 % injection 3 mL, 3 mL, Intravenous, PRN, Mansy, Jan A, MD   tamsulosin [facility]) capsule 0.4 mg, 0.4 mg, Oral, Daily, Mansy, Jan A, MD, 0.4 mg at 04/28/21 0932   vitamin B-12 (CYANOCOBALAMIN) tablet 1,000 mcg, 1,000 mcg, Oral, Daily, Mansy, Jan A, MD, 1,000 mcg at 04/28/21 6712  Current Outpatient Medications:    atorvastatin (LIPITOR) 40 MG tablet, Take 1 tablet (40 mg total) by mouth daily at 6 PM., Disp: 30 tablet, Rfl: 0   carvedilol (COREG) 6.25 MG  tablet, Take 6.25 mg by mouth 2 (two) times daily with a meal., Disp: , Rfl:    cetirizine (ZYRTEC) 10 MG tablet, Take 10 mg by mouth., Disp: , Rfl:    clobetasol cream (TEMOVATE) 4.58 %, Apply 1 application topically 2 (two) times daily as needed (psoriasis)., Disp: , Rfl: 1   clopidogrel (PLAVIX) 75 MG tablet, TAKE 1 TABLET(75 MG) BY MOUTH EVERY DAY, Disp: 30 tablet, Rfl: 1   cyanocobalamin 1000 MCG tablet, Take 1,000 mcg by mouth daily. , Disp: , Rfl:    finasteride (PROSCAR) 5 MG tablet, Take 1 tablet (5 mg total) by mouth daily., Disp: 90 tablet, Rfl: 3   furosemide (LASIX) 20 MG tablet, Hold this medication until followup with nephrology or cardiology. (Patient taking differently: Take 20 mg by mouth daily.), Disp: 30 tablet, Rfl:    GLUCOSAMINE-CHONDROITIN-VIT C PO, Take 1 tablet by mouth daily., Disp: , Rfl:    hydrALAZINE (APRESOLINE) 100 MG tablet, Take 1 tablet (100 mg total) by mouth every 8 (eight) hours. (Patient taking differently: Take 100 mg by mouth every 12 (twelve) hours.), Disp: 270 tablet, Rfl: 0   isosorbide mononitrate (IMDUR) 30 MG 24 hr tablet, Take 30 mg by mouth daily., Disp: , Rfl:    omeprazole (PRILOSEC) 20 MG capsule, Take 20 mg by mouth in the morning., Disp: , Rfl:    tamsulosin (FLOMAX) 0.4 MG CAPS capsule, Take 1 capsule (0.4 mg total) by mouth daily., Disp: 90 capsule, Rfl: 3   calcitRIOL (ROCALTROL) 0.25 MCG capsule, Take 0.25 mcg by mouth daily., Disp: , Rfl:    losartan (COZAAR) 100 MG tablet, Hold until followup with your outpatient doctor due to worsening kidney function., Disp: , Rfl:    spironolactone (ALDACTONE) 25 MG tablet, Hold until followup with your outpatient doctor due to worsening kidney function., Disp: , Rfl:     ALLERGIES   Iodinated contrast media and  Iodinated glycerol  [glycerol, iodinated]     REVIEW OF SYSTEMS    Review of Systems:  Gen:  Denies  fever, sweats, chills weigh loss  HEENT: Denies blurred vision, double vision,  ear pain, eye pain, hearing loss, nose bleeds, sore throat Cardiac:  No dizziness, chest pain or heaviness, chest tightness,edema Resp:   Denies cough or sputum porduction, shortness of breath,wheezing, hemoptysis,  Gi: Denies swallowing difficulty, stomach pain, nausea or vomiting, diarrhea, constipation, bowel incontinence Gu:  Denies bladder incontinence, burning urine Ext:   Denies Joint pain, stiffness or swelling Skin: Denies  skin rash, easy bruising or bleeding or hives Endoc:  Denies polyuria, polydipsia , polyphagia or weight change Psych:   Denies depression, insomnia or hallucinations   Other:  All other systems negative   VS: BP (!) 162/68    Pulse 71    Temp 98.3 F (36.8 C) (Oral)    Resp 18    Ht 5\' 11"  (1.803 m)    Wt 93 kg    SpO2 97%    BMI 28.59 kg/m      PHYSICAL EXAM    GENERAL:NAD, no fevers, chills, no weakness no fatigue HEAD: Normocephalic, atraumatic.  EYES: Pupils equal, round, reactive to light. Extraocular muscles intact. No scleral icterus.  MOUTH: Moist mucosal membrane. Dentition intact. No abscess noted.  EAR, NOSE, THROAT: Clear without exudates. No external lesions.  NECK: Supple. No thyromegaly. No nodules. No JVD.  PULMONARY: Diffuse coarse rhonchi right sided +wheezes CARDIOVASCULAR: S1 and S2. Regular rate and rhythm. No murmurs, rubs, or gallops. No edema. Pedal pulses 2+ bilaterally.  GASTROINTESTINAL: Soft, nontender, nondistended. No masses. Positive bowel sounds. No hepatosplenomegaly.  MUSCULOSKELETAL: No swelling, clubbing, or edema. Range of motion full in all extremities.  NEUROLOGIC: Cranial nerves II through XII are intact. No gross focal neurological deficits. Sensation intact. Reflexes intact.  SKIN: No ulceration, lesions, rashes, or cyanosis. Skin warm and dry. Turgor intact.  PSYCHIATRIC: Mood, affect within normal limits. The patient is awake, alert and oriented x 3. Insight, judgment intact.       IMAGING    CT  ABDOMEN PELVIS WO CONTRAST  Result Date: 04/04/2021 CLINICAL DATA:  Retroperitoneal hemorrhage suspected. EXAM: CT ABDOMEN AND PELVIS WITHOUT CONTRAST TECHNIQUE: Multidetector CT imaging of the abdomen and pelvis was performed following the standard protocol without IV contrast. COMPARISON:  01/29/2016 FINDINGS: Lower chest: Bibasilar atelectasis noted with tiny bilateral pleural effusions. Hepatobiliary: No focal abnormality in the liver on this study without intravenous contrast. Layering tiny gallstones evident. No intrahepatic or extrahepatic biliary dilation. Pancreas: No focal mass lesion. No dilatation of the main duct. No intraparenchymal cyst. No peripancreatic edema. Spleen: No splenomegaly. No focal mass lesion. Adrenals/Urinary Tract: No adrenal nodule or mass. Small cyst noted upper pole left kidney. No evidence for hydroureter. The urinary bladder appears normal for the degree of distention. Stomach/Bowel: Stomach is unremarkable. No gastric wall thickening. No evidence of outlet obstruction. Duodenum is normally positioned as is the ligament of Treitz. No small bowel wall thickening. No small bowel dilatation. The terminal ileum is normal. The appendix is normal. No gross colonic mass. No colonic wall thickening. Diverticular changes are noted in the left colon without evidence of diverticulitis. 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 Low-attenuation left para-aortic lymph nodes (image 32/2) are stable since 2017 consistent with benign etiology. No pelvic sidewall lymphadenopathy. Reproductive: The prostate gland and seminal vesicles are unremarkable.  Other: No intraperitoneal free fluid. Musculoskeletal: Multiple supraumbilical ventral hernias identified, containing only fat. Stable bone island left anterior pubic ramus. No worrisome lytic or sclerotic osseous abnormality. IMPRESSION: 1. No acute findings  in the abdomen or pelvis. Specifically, no findings to suggests retroperitoneal hemorrhage. 2. 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 3. No change in the left para-aortic lymphadenopathy consistent with benign/reactive etiology. 4. Cholelithiasis. 5. Multiple supraumbilical ventral hernias containing only fat. Electronically Signed   By: Misty Stanley M.D.   On: 04/04/2021 07:36   DG Chest 2 View  Result Date: 04/27/2021 CLINICAL DATA:  Chest pain. EXAM: CHEST - 2 VIEW COMPARISON:  None. FINDINGS: Stable position of cardiac pacemaker.  Aortic stent graft noted. Tortuosity and calcific atherosclerotic disease of the aorta. Cardiomediastinal silhouette is normal. Mediastinal contours appear intact. There is no evidence of focal airspace consolidation, pleural effusion or pneumothorax. Probable bilateral small pleural effusions. Osseous structures are without acute abnormality. Soft tissues are grossly normal. IMPRESSION: Probable bilateral small pleural effusions. Electronically Signed   By: Fidela Salisbury M.D.   On: 04/27/2021 13:45   DG Chest 2 View  Result Date: 04/11/2021 CLINICAL DATA:  Shortness of breath EXAM: CHEST - 2 VIEW COMPARISON:  04/03/2021 FINDINGS: No focal consolidation. No pleural effusion or pneumothorax. Heart and mediastinal contours are unremarkable. Dual lead cardiac pacemaker. No acute osseous abnormality. IMPRESSION: No active cardiopulmonary disease. Electronically Signed   By: Kathreen Devoid M.D.   On: 04/11/2021 15:11   DG Chest 2 View  Result Date: 04/03/2021 CLINICAL DATA:  sob EXAM: CHEST - 2 VIEW COMPARISON:  Chest x-ray 09/07/2016, CT chest 12/14/2011. FINDINGS: The heart and mediastinal contours are unchanged. Coronary artery stent. Aortic calcification. The distal descending thoracic aorta/suprarenal abdominal aorta stent noted on lateral view. Three lead cardiac pacemaker/defibrillator  overlies the left chest wall. No focal consolidation. No pulmonary edema. Blunting of bilateral costophrenic angles. No pneumothorax. No acute osseous abnormality. IMPRESSION: Blunting of bilateral costophrenic angles. Trace pleural effusions not excluded. Electronically Signed   By: Iven Finn M.D.   On: 04/03/2021 22:56   CT CHEST WO CONTRAST  Result Date: 04/12/2021 CLINICAL DATA:  Shortness of breath. EXAM: CT CHEST WITHOUT CONTRAST TECHNIQUE: Multidetector CT imaging of the chest was performed following the standard protocol without IV contrast. COMPARISON:  December 14, 2011 FINDINGS: Cardiovascular: A dual lead AICD is in place. There is moderate to marked severity calcification of the aortic arch and descending thoracic aorta. 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 A stent is noted within the distal aspect of the descending thoracic aorta. This extends to include the visualized portion of the abdominal aorta. Normal heart size with coronary artery stents in place. No pericardial effusion. Mediastinum/Nodes: There is mild AP window and pretracheal lymphadenopathy. Thyroid gland, trachea, and esophagus demonstrate no significant findings. Lungs/Pleura: There is mild emphysematous lung disease involving the bilateral upper lobes. Mild areas of linear scarring and/or atelectasis are noted within the posterior aspects of the bilateral lung bases. There are very small bilateral pleural effusions. No pneumothorax is identified. Upper Abdomen: A thin layer of tiny gallstones is seen within the dependent portion of the gallbladder. Vascular stents are seen within the origins of the celiac artery, superior mesenteric artery and bilateral renal arteries. Musculoskeletal: Multilevel degenerative changes seen throughout the thoracic spine. IMPRESSION: 1. Mild emphysematous lung disease. 2. Mild bibasilar linear scarring and/or atelectasis. 3. Very small  bilateral pleural effusions. 4.  Extensive stenting of the distal descending thoracic aorta, as well as the abdominal aorta and several of its branches. 5. Cholelithiasis. Aortic Atherosclerosis (ICD10-I70.0) and Emphysema (ICD10-J43.9). Electronically Signed   By: Virgina Norfolk M.D.   On: 04/12/2021 04:11   NM Pulmonary Perfusion  Result Date: 04/12/2021 CLINICAL DATA:  Evaluate for pulmonary embolus. Shortness of breath. EXAM: NUCLEAR MEDICINE PERFUSION LUNG SCAN TECHNIQUE: Perfusion images were obtained in multiple projections after intravenous injection of radiopharmaceutical. Ventilation scans intentionally deferred if perfusion scan and chest x-ray adequate for interpretation during COVID 19 epidemic. RADIOPHARMACEUTICALS:  4.3 mCi Tc-35m MAA IV COMPARISON:  Chest radiograph 04/11/21 FINDINGS: On the chest radiograph from 04/11/2021 there is a ICD overlying the left chest wall. No focal pulmonary opacities identified. On the perfusion portion of the examination there is a focal peripheral defect overlying the left upper lobe corresponding to the ICD battery pack. Mild heterogeneous distribution of the radiopharmaceutical is noted within both lungs. On the lateral projection images there are large nonsegmental areas of diminished activity within both posterior and upper lung zones which likely represents non-uniform soft tissue attenuation artifact secondary to patient's upper extremities. No peripheral segmental perfusion defects to suggest acute pulmonary embolus. IMPRESSION: 1. No evidence for acute pulmonary embolus. Electronically Signed   By: Kerby Moors M.D.   On: 04/12/2021 11:01   US RENAL  Result Date: 04/08/2021 CLINICAL DATA:  Renal dysfunction. EXAM: RENAL / URINARY TRACT ULTRASOUND COMPLETE COMPARISON:  Abdominal sonogram done on 06/20/2015 FINDINGS: Right Kidney: Renal measurements: 10.9 x 5.3 x 5 cm = volume: 152 mL. There is no hydronephrosis. There is increased cortical echogenicity. Left Kidney: Renal  measurements: 11.2 x 5.5 x 4.5 cm = volume: 147 mL. There is no hydronephrosis. There is increased cortical echogenicity. There is 2 cm cyst in the upper pole. Bladder: Appears normal for degree of bladder distention. Other: None. IMPRESSION: There is no hydronephrosis. Increased cortical echogenicity suggests medical renal disease. 2 cm left renal cyst. Electronically Signed   By: Elmer Picker M.D.   On: 04/08/2021 16:11   US Venous Img Lower Bilateral  Result Date: 04/04/2021 CLINICAL DATA:  Bilateral lower extremity edema, dyspnea EXAM: BILATERAL LOWER EXTREMITY VENOUS DOPPLER ULTRASOUND TECHNIQUE: Gray-scale sonography with compression, as well as color and duplex ultrasound, were performed to evaluate the deep venous system(s) from the level of the common femoral vein through the popliteal and proximal calf veins. COMPARISON:  None. FINDINGS: VENOUS Normal compressibility of the common femoral, superficial femoral, and popliteal veins, as well as the visualized calf veins. Visualized portions of profunda femoral vein and great saphenous vein unremarkable. No filling defects to suggest DVT on grayscale or color Doppler imaging. Doppler waveforms show normal direction of venous flow, normal respiratory plasticity and response to augmentation. OTHER None. Limitations: none IMPRESSION: Negative. Electronically Signed   By: Fidela Salisbury M.D.   On: 04/04/2021 03:32   US Venous Img Upper Uni Left  Result Date: 04/11/2021 CLINICAL DATA:  Left upper extremity pain and swelling. EXAM: LEFT UPPER EXTREMITY VENOUS DOPPLER ULTRASOUND TECHNIQUE: Gray-scale sonography with graded compression, as well as color Doppler and duplex ultrasound were performed to evaluate the upper extremity deep venous system from the level of the subclavian vein and including the jugular, axillary, basilic, radial, ulnar and upper cephalic vein. Spectral Doppler was utilized to evaluate flow at rest and with distal augmentation  maneuvers. COMPARISON:  None. FINDINGS: Contralateral Subclavian Vein: Respiratory phasicity is normal and symmetric  with the symptomatic side. No evidence of thrombus. Normal compressibility. Internal Jugular Vein: No evidence of thrombus. Normal compressibility, respiratory phasicity and response to augmentation. Subclavian Vein: No evidence of thrombus. Normal compressibility, respiratory phasicity and response to augmentation. Axillary Vein: No evidence of thrombus. Normal compressibility, respiratory phasicity and response to augmentation. Cephalic Vein: No evidence of thrombus. Normal compressibility, respiratory phasicity and response to augmentation. Basilic Vein: No evidence of thrombus. Normal compressibility, respiratory phasicity and response to augmentation. Brachial Veins: No evidence of thrombus. Normal compressibility, respiratory phasicity and response to augmentation. Radial Veins: No evidence of thrombus. Normal compressibility, respiratory phasicity and response to augmentation. Ulnar Veins: No evidence of thrombus. Normal compressibility, respiratory phasicity and response to augmentation. Venous Reflux:  None visualized. Other Findings:  None visualized. IMPRESSION: No evidence of DVT within the LEFT upper extremity. Electronically Signed   By: Virgina Norfolk M.D.   On: 04/11/2021 20:25   ECHOCARDIOGRAM COMPLETE  Result Date: 04/06/2021    ECHOCARDIOGRAM REPORT   Patient Name:   LUAY BALDING Date of Exam: 04/04/2021 Medical Rec #:  [id number]        Height:       70.0 in Accession #:    [id number]       Weight:       220.0 lb Date of Birth:  1940/05/12       BSA:          2.174 m Patient Age:    28 years         BP:           198/65 mmHg Patient Gender: M                HR:           89 bpm. Exam Location:  [facility] Procedure: 2D Echo, Cardiac Doppler and Color Doppler Indications:     Elevated Troponin  History:         Patient has prior history of Echocardiogram examinations, most                   recent 08/08/2016. CHF, Previous Myocardial Infarction; Risk                  Factors:Hypertension.  Sonographer:     Sherrie Sport Referring Phys:  [id number] JOINOMVE AGBATA Diagnosing Phys: Yolonda Kida MD  Sonographer Comments: Suboptimal parasternal window. IMPRESSIONS  1. Left ventricular ejection fraction, by estimation, is 50 to 55%. The left ventricle has low normal function. The left ventricle has no regional wall motion abnormalities. The left ventricular internal cavity size was mildly dilated. There is mild left ventricular hypertrophy. Left ventricular diastolic parameters are consistent with Grade II diastolic dysfunction (pseudonormalization).  2. Right ventricular systolic function is low normal. The right ventricular size is moderately enlarged.  3. The mitral valve is normal in structure. Mild mitral valve regurgitation.  4. The aortic valve is normal in structure. Aortic valve regurgitation is mild. FINDINGS  Left Ventricle: Left ventricular ejection fraction, by estimation, is 50 to 55%. The left ventricle has low normal function. The left ventricle has no regional wall motion abnormalities. The left ventricular internal cavity size was mildly dilated. There is mild left ventricular hypertrophy. Left ventricular diastolic parameters are consistent with Grade II diastolic dysfunction (pseudonormalization). Right Ventricle: The right ventricular size is moderately enlarged. No increase in right ventricular wall thickness. Right ventricular systolic function is low normal. Left Atrium: Left atrial size was normal in  size. Right Atrium: Right atrial size was normal in size. Pericardium: There is no evidence of pericardial effusion. Mitral Valve: The mitral valve is normal in structure. Mild mitral valve regurgitation. Tricuspid Valve: The tricuspid valve is normal in structure. Tricuspid valve regurgitation is mild. Aortic Valve: The aortic valve is normal in structure. Aortic valve  regurgitation is mild. Aortic valve mean gradient measures 4.0 mmHg. Aortic valve peak gradient measures 6.7 mmHg. Aortic valve area, by VTI measures 3.09 cm. Pulmonic Valve: The pulmonic valve was normal in structure. Pulmonic valve regurgitation is not visualized. Aorta: The ascending aorta was not well visualized. IAS/Shunts: No atrial level shunt detected by color flow Doppler. Additional Comments: A device lead is visualized. There is no pleural effusion.  LEFT VENTRICLE PLAX 2D LVIDd:         4.90 cm   Diastology LVIDs:         3.30 cm   LV e' medial:    7.07 cm/s LV PW:         1.50 cm   LV E/e' medial:  14.0 LV IVS:        1.05 cm   LV e' lateral:   8.27 cm/s LVOT diam:     2.10 cm   LV E/e' lateral: 12.0 LV SV:         86 LV SV Index:   40 LVOT Area:     3.46 cm  RIGHT VENTRICLE RV Basal diam:  4.40 cm RV S prime:     14.40 cm/s TAPSE (M-mode): 4.7 cm LEFT ATRIUM           Index        RIGHT ATRIUM           Index LA diam:      2.90 cm 1.33 cm/m   RA Area:     27.00 cm LA Vol (A2C): 45.6 ml 20.98 ml/m  RA Volume:   85.90 ml  39.52 ml/m LA Vol (A4C): 56.1 ml 25.81 ml/m  AORTIC VALVE                    PULMONIC VALVE AV Area (Vmax):    3.01 cm     PV Vmax:        0.70 m/s AV Area (Vmean):   3.08 cm     PV Vmean:       44.100 cm/s AV Area (VTI):     3.09 cm     PV VTI:         0.107 m AV Vmax:           129.00 cm/s  PV Peak grad:   1.9 mmHg AV Vmean:          88.400 cm/s  PV Mean grad:   1.0 mmHg AV VTI:            0.279 m      RVOT Peak grad: 3 mmHg AV Peak Grad:      6.7 mmHg AV Mean Grad:      4.0 mmHg LVOT Vmax:         112.00 cm/s LVOT Vmean:        78.700 cm/s LVOT VTI:          0.249 m LVOT/AV VTI ratio: 0.89  AORTA Ao Root diam: 3.40 cm MITRAL VALVE               TRICUSPID VALVE MV Area (PHT): 4.83 cm    TR  Peak grad:   40.4 mmHg MV Decel Time: 157 msec    TR Vmax:        318.00 cm/s MV E velocity: 99.00 cm/s MV A velocity: 87.00 cm/s  SHUNTS MV E/A ratio:  1.14        Systemic VTI:  0.25 m                             Systemic Diam: 2.10 cm                            Pulmonic VTI:  0.149 m Yolonda Kida MD Electronically signed by Yolonda Kida MD Signature Date/Time: 04/06/2021/11:32:38 AM    Final       ASSESSMENT/PLAN   Acute on chronic hypoxemic respiratory failure   - Patient is a lifelong smoker, we discussed smoking cessation    -I spoke with [name] his daughter and she reports substantial anxiety due to air hunger and breathlessness  - Patient has acute on chronic CHF with 3rd re-admission  -Acute renal failure - nephrology on case appreciate input -We discussed COPD and outpatient follow up     Acute exacerbation of COPD  With bronchiectasis and mild mucus plugging bilaterally per CT Chest  - COPD care path  - incentive spirometry and flutter valve  - PT/OT-patient   - prednisone 10mg   - duoneb - q6hprn  - zithromax 250 po daliy    Thank you for allowing me to participate in the care of this patient.  Total face to face encounter time for this patient visit was >45 min. >50% of the time was  spent in counseling and coordination of care.   Patient/Family are satisfied with care plan and all questions have been answered.  This document was prepared using Dragon voice recognition software and may include unintentional dictation errors.     Ottie Glazier, M.D.  Division of [facility]

## 2021-04-29 NOTE — Progress Notes (Signed)
[facility]  ROUNDING NOTE   Subjective:   Peter [name] is a 81 y.o. 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  Patient presents to the emergency department with complaint of worsening shortness of breath.  Patient describes a sudden onset.  Reports shortness of breath with activity and during the night.  Patient has been admitted for Acute exacerbation of CHF (congestive heart failure) ([place]) [I50.9]  Patient is known to our practice and receives outpatient care from Dr. Juleen China.   Patient seen sitting at the side of the bed, remains on 2 L oxygen Urine output 1.2 L recorded in 24 hours Denies shortness of breath  Creatinine 2.81  Objective:  Vital signs in last 24 hours:  Temp:  [98.1 F (36.7 C)-98.3 F (36.8 C)] 98.3 F (36.8 C) (01/17 0825) Pulse Rate:  [34-78] 71 (01/17 0825) Resp:  [7-20] 18 (01/17 0825) BP: (134-179)/(56-105) 162/68 (01/17 0825) SpO2:  [92 %-99 %] 97 % (01/17 0825)  Weight change:  Filed Weights   04/27/21 1300  Weight: 93 kg    Intake/Output: I/O last 3 completed shifts: In: 48.4 [IV Piggyback:48.4] Out: 1600 [Urine:1600]   Intake/Output this shift:  No intake/output data recorded.  Physical Exam: General: NAD, resting on stretcher  Head: Normocephalic, atraumatic. Moist oral mucosal membranes  Eyes: Anicteric  Lungs:  Rhonchi and wheezing, normal effort, 2 L [place]  Heart: Regular rate and rhythm  Abdomen:  Soft, nontender  Extremities: 2+ peripheral edema.  Neurologic: Nonfocal, moving all four extremities  Skin: No lesions       Basic Metabolic Panel: Recent Labs  Lab 04/27/21 1305 04/27/21 2246 04/28/21 0611 04/29/21 0611  NA 138  --  142 141  K 4.0   3.9  --  3.5 3.6  CL 108  --  109 108  CO2 24  --  24 26  GLUCOSE 114*  --  87 92  BUN 40*  --  44* 44*   CREATININE 2.83* 3.10* 3.04* 2.81*  CALCIUM 8.2*  --  8.1* 8.1*  MG 1.8  --   --   --      Liver Function Tests: No results for input(s): AST, ALT, ALKPHOS, BILITOT, PROT, ALBUMIN in the last 168 hours. No results for input(s): LIPASE, AMYLASE in the last 168 hours. No results for input(s): AMMONIA in the last 168 hours.  CBC: Recent Labs  Lab 04/27/21 1305 04/27/21 2246  WBC 7.0 6.3  HGB 11.4* 10.9*  HCT 35.0* 33.1*  MCV 89.7 90.9  PLT 168 144*     Cardiac Enzymes: No results for input(s): CKTOTAL, CKMB, CKMBINDEX, TROPONINI in the last 168 hours.  BNP: Invalid input(s): POCBNP  CBG: No results for input(s): GLUCAP in the last 168 hours.  Microbiology: Results for orders placed or performed during the hospital encounter of 04/27/21  Resp Panel by RT-PCR (Flu A&B, Covid) Nasopharyngeal Swab     Status: None   Collection Time: 04/27/21  6:30 PM   Specimen: Nasopharyngeal Swab; Nasopharyngeal(NP) swabs in vial transport medium  Result Value Ref Range Status   SARS Coronavirus 2 by RT PCR NEGATIVE NEGATIVE Final    Comment: (NOTE) SARS-CoV-2 target nucleic acids are NOT DETECTED.  The SARS-CoV-2 RNA is generally detectable in upper respiratory specimens during the acute phase of infection. The lowest concentration of SARS-CoV-2 viral copies this assay can detect  is 138 copies/mL. A negative result does not preclude SARS-Cov-2 infection and should not be used as the sole basis for treatment or other patient management decisions. A negative result may occur with  improper specimen collection/handling, submission of specimen other than nasopharyngeal swab, presence of viral mutation(s) within the areas targeted by this assay, and inadequate number of viral copies(<138 copies/mL). A negative result must be combined with clinical observations, patient history, and epidemiological information. The expected result is Negative.  Fact Sheet for Patients:   EntrepreneurPulse.com.au  Fact Sheet for Healthcare Providers:  IncredibleEmployment.be  This test is no t yet approved or cleared by the [place] FDA and  has been authorized for detection and/or diagnosis of SARS-CoV-2 by FDA under an Emergency Use Authorization (EUA). This EUA will remain  in effect (meaning this test can be used) for the duration of the COVID-19 declaration under Section 564(b)(1) of the Act, 21 U.S.C.section 360bbb-3(b)(1), unless the authorization is terminated  or revoked sooner.       Influenza A by PCR NEGATIVE NEGATIVE Final   Influenza B by PCR NEGATIVE NEGATIVE Final    Comment: (NOTE) The Xpert Xpress SARS-CoV-2/FLU/RSV plus assay is intended as an aid in the diagnosis of influenza from Nasopharyngeal swab specimens and should not be used as a sole basis for treatment. Nasal washings and aspirates are unacceptable for Xpert Xpress SARS-CoV-2/FLU/RSV testing.  Fact Sheet for Patients: EntrepreneurPulse.com.au  Fact Sheet for Healthcare Providers: IncredibleEmployment.be  This test is not yet approved or cleared by the [place] FDA and has been authorized for detection and/or diagnosis of SARS-CoV-2 by FDA under an Emergency Use Authorization (EUA). This EUA will remain in effect (meaning this test can be used) for the duration of the COVID-19 declaration under Section 564(b)(1) of the Act, 21 U.S.C. section 360bbb-3(b)(1), unless the authorization is terminated or revoked.  Performed at [facility], [address] [place], [place] [address]     Coagulation Studies: No results for input(s): LABPROT, INR in the last 72 hours.  Urinalysis: No results for input(s): COLORURINE, LABSPEC, PHURINE, GLUCOSEU, HGBUR, BILIRUBINUR, KETONESUR, PROTEINUR, UROBILINOGEN, NITRITE, LEUKOCYTESUR in the last 72 hours.  Invalid input(s): APPERANCEUR    Imaging: DG  Chest 2 View  Result Date: 04/27/2021 CLINICAL DATA:  Chest pain. EXAM: CHEST - 2 VIEW COMPARISON:  None. FINDINGS: Stable position of cardiac pacemaker.  Aortic stent graft noted. Tortuosity and calcific atherosclerotic disease of the aorta. Cardiomediastinal silhouette is normal. Mediastinal contours appear intact. There is no evidence of focal airspace consolidation, pleural effusion or pneumothorax. Probable bilateral small pleural effusions. Osseous structures are without acute abnormality. Soft tissues are grossly normal. IMPRESSION: Probable bilateral small pleural effusions. Electronically Signed   By: Fidela Salisbury M.D.   On: 04/27/2021 13:45     Medications:    sodium chloride      amLODipine  10 mg Oral Daily   atorvastatin  40 mg Oral q1800   azithromycin  250 mg Oral Daily   carvedilol  6.25 mg Oral BID WC   clobetasol cream   Topical BID   clopidogrel  75 mg Oral Daily   enoxaparin (LOVENOX) injection  30 mg Subcutaneous Q24H   finasteride  5 mg Oral Daily   furosemide  40 mg Intravenous Q12H   hydrALAZINE  100 mg Oral Q8H   isosorbide mononitrate  60 mg Oral Daily   loratadine  10 mg Oral Daily   pantoprazole  40 mg Oral Daily   potassium  chloride  40 mEq Oral Once   predniSONE  10 mg Oral Q breakfast   sodium chloride flush  3 mL Intravenous Q12H   spironolactone  25 mg Oral Daily   tamsulosin  0.4 mg Oral Daily   cyanocobalamin  1,000 mcg Oral Daily   sodium chloride, acetaminophen, ALPRAZolam, ipratropium-albuterol, labetalol, nitroGLYCERIN, ondansetron (ZOFRAN) IV, sodium chloride flush  Assessment/ Plan:  [name] is a 81 y.o.  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  Patient presents to the emergency department with complaint of worsening shortness of breath.  Patient describes a sudden onset.   Reports shortness of breath with activity and during the night.  Patient has been admitted for Acute exacerbation of CHF (congestive heart failure) ([place]) [I50.9]   Acute Kidney Injury on chronic kidney disease stage IV with baseline creatinine 2.8 and GFR of 22 on 04/27/21.  Acute kidney injury possibly secondary to cardiorenal syndrome Chronic kidney disease is secondary to multiple factors including hypertension and extensive cardiac history. Losartan held No indication for dialysis at this time. No IV contrast exposure Continue IV diuresis at this time.  Adequate urine output noted.  Will need diuretic regimen at discharge.  We will continue to monitor  Lab Results  Component Value Date   CREATININE 2.81 (H) 04/29/2021   CREATININE 3.04 (H) 04/28/2021   CREATININE 3.10 (H) 04/27/2021    Intake/Output Summary (Last 24 hours) at 04/29/2021 1245 Last data filed at 04/29/2021 0645 Gross per 24 hour  Intake --  Output 800 ml  Net -800 ml    2.  Hypotension with chronic kidney disease.  Home regimen includes carvedilol, furosemide, hydralazine, isosorbide, losartan, and spironolactone.  Currently also receiving amlodipine and labetalol.  Losartan and spironolactone currently held.  BP currently 162/68  3.  Combined systolic and diastolic heart failure.  Followed by Dr. Georgia Dom.  Echo completed on 04/04/2021 indicates EF of 50 to 55% with mild LVH and grade 2 diastolic dysfunction.  With right ventricular enlargement.    LOS: 2 [place] 1/17/202312:45 PM

## 2021-04-30 LAB — BASIC METABOLIC PANEL
Anion gap: 6 (ref 5–15)
BUN: 46 mg/dL — ABNORMAL HIGH (ref 8–23)
CO2: 28 mmol/L (ref 22–32)
Calcium: 8.1 mg/dL — ABNORMAL LOW (ref 8.9–10.3)
Chloride: 105 mmol/L (ref 98–111)
Creatinine, Ser: 3.67 mg/dL — ABNORMAL HIGH (ref 0.61–1.24)
GFR, Estimated: 16 mL/min — ABNORMAL LOW (ref >60)
Glucose, Bld: 87 mg/dL (ref 70–99)
Potassium: 3.8 mmol/L (ref 3.5–5.1)
Sodium: 139 mmol/L (ref 135–145)

## 2021-04-30 LAB — IRON AND TIBC
Iron: 89 ug/dL (ref 45–182)
Saturation Ratios: 48 % — ABNORMAL HIGH (ref 17.9–39.5)
TIBC: 186 ug/dL — ABNORMAL LOW (ref 250–450)
UIBC: 97 ug/dL

## 2021-04-30 LAB — FOLATE: Folate: 9.1 ng/mL (ref >5.9)

## 2021-04-30 LAB — VITAMIN B12: Vitamin B-12: 522 pg/mL (ref 180–914)

## 2021-04-30 LAB — VITAMIN D 25 HYDROXY (VIT D DEFICIENCY, FRACTURES): Vit D, 25-Hydroxy: 24.43 ng/mL — ABNORMAL LOW (ref 30–100)

## 2021-04-30 MED ORDER — FUROSEMIDE 40 MG PO TABS
40.0000 mg | ORAL_TABLET | Freq: Two times a day (BID) | ORAL | Status: DC
  Administered 2021-04-30 (×2): 40 mg via ORAL
  Filled 2021-04-30 (×3): qty 1

## 2021-04-30 NOTE — Care Management Important Message (Signed)
Important Message  Patient Details  Name: Peter [name] MRN: [id number] Date of Birth: 06/22/1940   Medicare Important Message Given:  Yes     [name] 04/30/2021, 11:26 AM

## 2021-04-30 NOTE — Progress Notes (Signed)
Physical Therapy Treatment Patient Details Name: Peter [name] MRN: [id number] DOB: May 23, 1940 Today's Date: 04/30/2021   History of Present Illness Peter [name] is a 81 y.o. Caucasian male with medical history significant for systolic and diastolic CHF, coronary artery disease, hypertension, dyslipidemia and coronary artery disease and non-STEMI status post PCI and stent, and status post AICD, who presented to the ER with acute onset of significantly worsening dyspnea today.    PT Comments    Pt received in bed agreeable to PT. Remains on 2L/min with Spo2 > 90% throughout. Tolerating LE therex well with good understanding of form/technique.  Did require cuing for decreasing speed of exercise. Pt remains mod I with bed mobility and standing. Pt more steady today with consistent step through pattern without sway present with improved gait speed. Able to tolerate horizontal/vertical head turns in hallway with minguard for safety with maintaining gait speed and no LOB and display ability to speed up and slow down with gait velocity with good change of speed throughout. Pt returning to room with minor SoB that improves with 2 min seated rest. Pt requesting to rest and sit EoB post session. RN aware. D/c recs remain appropriate as pt continues to display some minor balance deficits and endurance deficits with OOB mobility and exercise.    Recommendations for follow up therapy are one component of a multi-disciplinary discharge planning process, led by the attending physician.  Recommendations may be updated based on patient status, additional functional criteria and insurance authorization.  Follow Up Recommendations  Home health PT     Assistance Recommended at Discharge Intermittent Supervision/Assistance  Patient can return home with the following Assist for transportation;Help with stairs or ramp for entrance   Equipment Recommendations  Rolling walker (2 wheels)    Recommendations for  Other Services       Precautions / Restrictions Precautions Precautions: Fall Restrictions Weight Bearing Restrictions: No     Mobility  Bed Mobility Overal bed mobility: Modified Independent             General bed mobility comments: safe technique Patient Response: Cooperative  Transfers Overall transfer level: Modified independent Equipment used: None Transfers: Sit to/from Stand Sit to Stand: Modified independent (Device/Increase time)           General transfer comment: Able to stand today with no AD without LOB. Touch on counter top support but does not appear it is needed for steadiness.    Ambulation/Gait Ambulation/Gait assistance: Supervision, Min guard Gait Distance (Feet): 180 Feet Assistive device: None Gait Pattern/deviations: Trunk flexed, Step-through pattern, Decreased stride length       General Gait Details: Pt displaying improved steadiness today, able to tolerate quicker gait speed without AD needed.   Stairs             Wheelchair Mobility    Modified Rankin (Stroke Patients Only)       Balance Overall balance assessment: Needs assistance Sitting-balance support: Feet supported Sitting balance-Leahy Scale: Good Sitting balance - Comments: steady static sitting, reaching within BOS. ABle to don shoes safely.     Standing balance-Leahy Scale: Good Standing balance comment: Steady static standing without UE support.             High level balance activites: Head turns High Level Balance Comments: Horizontal and vertical head turns, 20'/exercise. No LOB, maintains gait speed.            Cognition Arousal/Alertness: Awake/alert Behavior During Therapy: WFL for tasks assessed/performed Overall  Cognitive Status: Within Functional Limits for tasks assessed                                          Exercises General Exercises - Lower Extremity Ankle Circles/Pumps: AROM, Supine, Both, 10 reps Long  Arc Quad: AROM, Seated, Strengthening, Both, 10 reps Heel Slides: AROM, Supine, Strengthening, Both, 10 reps Hip ABduction/ADduction: AROM, Supine, Strengthening, Both, 10 reps Straight Leg Raises: AROM, Supine, Strengthening, Both, 10 reps Hip Flexion/Marching: AROM, Seated, Strengthening, Both, 10 reps Other Exercises Other Exercises: x10 STS with UE's resting on thighs.    General Comments General comments (skin integrity, edema, etc.): HR remaining in mid 90's BPM with mobility, SPO2 >95% throughout on 2L/min.      Pertinent Vitals/Pain      Home Living                          Prior Function            PT Goals (current goals can now be found in the care plan section) Acute Rehab PT Goals Patient Stated Goal: pt wishing to receive rehab at facility due to living home alone PT Goal Formulation: With patient/family Time For Goal Achievement: 05/12/21 Potential to Achieve Goals: Poor Progress towards PT goals: Not progressing toward goals - comment (plan to return home due to current level of function)    Frequency    Min 2X/week      PT Plan Current plan remains appropriate    Co-evaluation              AM-PAC PT "6 Clicks" Mobility   Outcome Measure  Help needed turning from your back to your side while in a flat bed without using bedrails?: None Help needed moving from lying on your back to sitting on the side of a flat bed without using bedrails?: None Help needed moving to and from a bed to a chair (including a wheelchair)?: None Help needed standing up from a chair using your arms (e.g., wheelchair or bedside chair)?: A Little Help needed to walk in hospital room?: A Little Help needed climbing 3-5 steps with a railing? : A Little 6 Click Score: 21    End of Session Equipment Utilized During Treatment: Gait belt;Oxygen Activity Tolerance: Patient tolerated treatment well Patient left: in bed;with bed alarm set (seated at EOB) Nurse  Communication: Mobility status PT Visit Diagnosis: Difficulty in walking, not elsewhere classified (R26.2);Muscle weakness (generalized) (M62.81);Unsteadiness on feet (R26.81)     Time: 9604-5409 PT Time Calculation (min) (ACUTE ONLY): 20 min  Charges:  $Therapeutic Exercise: 8-22 mins                     Salem Caster. Fairly IV, PT, DPT Physical Therapist- [facility]  04/30/2021, 10:53 AM

## 2021-04-30 NOTE — Progress Notes (Signed)
PULMONOLOGY         Date: 04/30/2021,   MRN# [id number] Peter [name] 08-24-1940     AdmissionWeight: 93 kg                 CurrentWeight: 93.3 kg   Referring physician: Dr Tyrell Antonio   CHIEF COMPLAINT:   Acute hypoxemic respiratory failure   HISTORY OF PRESENT ILLNESS   Peter [name] is a 81 y.o. Caucasian male with medical history significant for systolic and diastolic CHF, coronary artery disease, hypertension, dyslipidemia and coronary artery disease and non-STEMI status post PCI and stent, and status post AICD, who presented to the ER with acute onset of significantly worsening dyspnea today.  He was noted to have orthopnea and paroxysmal nocturnal dyspnea as well as dyspnea on exertion.  Patient was admitted here from 12/22 to 04/09/2021 for symptomatic anemia for which she received 2 units of packed red blood cells and acute on chronic diastolic CHF and was then diuresed with IV Lasix however given worsening acute kidney injury on CKD stage IV his Lasix was held off.  He stated that he is not taking Lasix since then.  He was discharged to rehabitation and has been on oxygen there.  He was discharged home from rehab today and did not have oxygen at home.  He has been otherwise compliant with all of his medications.  The patient's troponins were elevated in the 200s during his last admission.  Pulse oximetry was down to 90% on room air.  The patient felt like his defibrillator may have went off while in the waiting room.  It was interrogated by the ER physician and showed increased PVCs count.  When he came to the ER, blood pressure was 197/85 and respiratory rate 25 with a pulse symmetry 93% on room air and has gone down to 90% on ambulation.  BP later normalized up to 201/94.  BMP tonight showed a BUN of 40 and creatinine 2.83 compared to 47/3.26 on 9/9.  At discharge on 12/28 BUN was 57 and creatinine 3.13 up from 67/2.95 at the time of his admission on 12/22.  BNP was  2988.6.  High-sensitivity troponin was 253 and later 259.  CBC showed hemoglobin of 11.4 hematocrit 35 up from previous levels.  Influenza antigens and COVID-19 PCR came back negative.  The patient was given 40 mg of IV Lasix.  He will be admitted to a cardiac telemetry bed for further evaluation and management.  04/30/21-patient is improved now on 2L/min [place]. He made good urine output overnight.  He walked with PT today and did well.  He is -3L on fluid balance.   PAST MEDICAL HISTORY   Past Medical History:  Diagnosis Date   Anginal pain ([place])    Asthma    Bladder infection, acute 03/2011   "had a whole lot of bleeding from this"   CHF (congestive heart failure) ([place])    Coronary artery disease    High cholesterol    Hypertension    Macular degeneration 12/14/2011   "had it in my right; getting shots now in my left"   Myocardial infarction [facility]) 1996   NSTEMI (non-ST elevated myocardial infarction) ([place]) 12/14/2011   Shortness of breath 12/14/2011   "@ rest, lying down, w/exertion"     SURGICAL HISTORY   Past Surgical History:  Procedure Laterality Date   [place]; ~ 2003; ~ 2008   "total of 3"  CORONARY STENT INTERVENTION N/A 08/10/2016   Procedure: Coronary Stent Intervention;  Surgeon: [name], MD;  Location: [facility];  Service: Cardiovascular;  Laterality: N/A;   HERNIA REPAIR  ~ 2001   "abdominal w/mesh implanted"   LEFT HEART CATH AND CORONARY ANGIOGRAPHY N/A 08/10/2016   Procedure: Left Heart Cath and Coronary Angiography;  Surgeon: [name], MD;  Location: [place] [facility];  Service: Cardiovascular;  Laterality: N/A;   LEFT HEART CATHETERIZATION WITH CORONARY ANGIOGRAM N/A 12/16/2011   Procedure: LEFT HEART CATHETERIZATION WITH CORONARY ANGIOGRAM;  Surgeon: Minus Breeding, MD;  Location: [facility] CATH LAB;  Service: Cardiovascular;  Laterality: N/A;   PERCUTANEOUS CORONARY STENT INTERVENTION (PCI-S) N/A  12/17/2011   Procedure: PERCUTANEOUS CORONARY STENT INTERVENTION (PCI-S);  Surgeon: Sherren Mocha, MD;  Location: [facility] C[facility] LAB;  Service: Cardiovascular;  Laterality: N/A;   TONSILLECTOMY     "I was a kid"     FAMILY HISTORY   Family History  Family history unknown: Yes     SOCIAL HISTORY   Social History   Tobacco Use   Smoking status: Former    Packs/day: 0.50    Years: 0.50    Pack years: 0.25    Types: Cigarettes   Smokeless tobacco: Never   Tobacco comments:    11/23/16 Still not ready to quit smoking.  Substance Use Topics   Alcohol use: Yes    Alcohol/week: 1.0 standard drink    Types: 1 Cans of beer per week    Comment: 12/14/2011 "if my kidneys are sore, I drink 1 beer/day; if not sore; no beer; occasionally drink socially; ave 1 beer/wk maybe"   Drug use: No     MEDICATIONS    Home Medication:     Current Medication:  Current Facility-Administered Medications:    0.9 %  sodium chloride infusion, 250 mL, Intravenous, PRN, Mansy, Jan A, MD   acetaminophen (TYLENOL) tablet 650 mg, 650 mg, Oral, Q4H PRN, Mansy, Jan A, MD   ALPRAZolam Duanne Moron) tablet 0.25 mg, 0.25 mg, Oral, BID PRN, Mansy, Jan A, MD   amLODipine (NORVASC) tablet 10 mg, 10 mg, Oral, Daily, Regalado, Belkys A, MD, 10 mg at 04/30/21 1002   atorvastatin (LIPITOR) tablet 40 mg, 40 mg, Oral, q1800, Mansy, Jan A, MD, 40 mg at 04/29/21 1838   azithromycin (ZITHROMAX) tablet 250 mg, 250 mg, Oral, Daily, Virginia Francisco, MD, 250 mg at 04/30/21 1001   carvedilol (COREG) tablet 6.25 mg, 6.25 mg, Oral, BID WC, Mansy, Jan A, MD, 6.25 mg at 04/29/21 1838   clobetasol cream (TEMOVATE) 0.05 %, , Topical, BID, Mansy, Jan A, MD   clopidogrel (PLAVIX) tablet 75 mg, 75 mg, Oral, Daily, Mansy, Jan A, MD, 75 mg at 04/30/21 1002   enoxaparin (LOVENOX) injection 30 mg, 30 mg, Subcutaneous, Q24H, Mansy, Jan A, MD, 30 mg at 04/29/21 2032   finasteride (PROSCAR) tablet 5 mg, 5 mg, Oral, Daily, Mansy, Jan A, MD, 5 mg at  04/30/21 1002   furosemide (LASIX) tablet 40 mg, 40 mg, Oral, BID, Breeze, Shantelle, NP, 40 mg at 04/30/21 1000   hydrALAZINE (APRESOLINE) tablet 100 mg, 100 mg, Oral, Q8H, Mansy, Jan A, MD, 100 mg at 04/30/21 0513   ipratropium-albuterol (DUONEB) 0.5-2.5 (3) MG/3ML nebulizer solution 3 mL, 3 mL, Nebulization, Q6H PRN, Lanney Gins, [place], MD, 3 mL at 04/29/21 1311   isosorbide mononitrate (IMDUR) 24 hr tablet 60 mg, 60 mg, Oral, Daily, Regalado, Belkys A, MD, 60 mg at 04/29/21 1032   labetalol (  NORMODYNE) injection 20 mg, 20 mg, Intravenous, Q3H PRN, Mansy, Jan A, MD, 20 mg at 04/27/21 2117   loratadine (CLARITIN) tablet 10 mg, 10 mg, Oral, Daily, Mansy, Jan A, MD, 10 mg at 04/29/21 1032   MEDLINE mouth rinse, 15 mL, Mouth Rinse, BID, Mansy, Jan A, MD   nitroGLYCERIN (NITROGLYN) 2 % ointment 1 inch, 1 inch, Topical, Q6H PRN, Mansy, Jan A, MD, 1 inch at 04/27/21 2300   ondansetron (ZOFRAN) injection 4 mg, 4 mg, Intravenous, Q6H PRN, Mansy, Jan A, MD   pantoprazole (PROTONIX) EC tablet 40 mg, 40 mg, Oral, Daily, Mansy, Jan A, MD, 40 mg at 04/30/21 1001   potassium chloride (KLOR-CON) packet 40 mEq, 40 mEq, Oral, Once, Mansy, Jan A, MD   predniSONE (DELTASONE) tablet 10 mg, 10 mg, Oral, Q breakfast, Lanney Gins, Esha Fincher, MD, 10 mg at 04/30/21 1001   sodium chloride flush (NS) 0.9 % injection 3 mL, 3 mL, Intravenous, Q12H, Mansy, Jan A, MD, 3 mL at 04/29/21 2034   sodium chloride flush (NS) 0.9 % injection 3 mL, 3 mL, Intravenous, PRN, Mansy, Jan A, MD   tamsulosin [facility]) capsule 0.4 mg, 0.4 mg, Oral, Daily, Mansy, Jan A, MD, 0.4 mg at 04/29/21 1032   vitamin B-12 (CYANOCOBALAMIN) tablet 1,000 mcg, 1,000 mcg, Oral, Daily, Mansy, Jan A, MD, 1,000 mcg at 04/30/21 1001    ALLERGIES   Iodinated contrast media and Iodinated glycerol  [glycerol, iodinated]     REVIEW OF SYSTEMS    Review of Systems:  Gen:  Denies  fever, sweats, chills weigh loss  HEENT: Denies blurred vision, double vision, ear  pain, eye pain, hearing loss, nose bleeds, sore throat Cardiac:  No dizziness, chest pain or heaviness, chest tightness,edema Resp:   Denies cough or sputum porduction, shortness of breath,wheezing, hemoptysis,  Gi: Denies swallowing difficulty, stomach pain, nausea or vomiting, diarrhea, constipation, bowel incontinence Gu:  Denies bladder incontinence, burning urine Ext:   Denies Joint pain, stiffness or swelling Skin: Denies  skin rash, easy bruising or bleeding or hives Endoc:  Denies polyuria, polydipsia , polyphagia or weight change Psych:   Denies depression, insomnia or hallucinations   Other:  All other systems negative   VS: BP (!) 159/60 (BP Location: Left Arm)    Pulse 66    Temp 98.2 F (36.8 C)    Resp 20    Ht 5' 10.98" (1.803 m)    Wt 93.3 kg    SpO2 98%    BMI 28.69 kg/m      PHYSICAL EXAM    GENERAL:NAD, no fevers, chills, no weakness no fatigue HEAD: Normocephalic, atraumatic.  EYES: Pupils equal, round, reactive to light. Extraocular muscles intact. No scleral icterus.  MOUTH: Moist mucosal membrane. Dentition intact. No abscess noted.  EAR, NOSE, THROAT: Clear without exudates. No external lesions.  NECK: Supple. No thyromegaly. No nodules. No JVD.  PULMONARY: mild ronchi bilaterally  CARDIOVASCULAR: S1 and S2. Regular rate and rhythm. No murmurs, rubs, or gallops. No edema. Pedal pulses 2+ bilaterally.  GASTROINTESTINAL: Soft, nontender, nondistended. No masses. Positive bowel sounds. No hepatosplenomegaly.  MUSCULOSKELETAL: No swelling, clubbing, or edema. Range of motion full in all extremities.  NEUROLOGIC: Cranial nerves II through XII are intact. No gross focal neurological deficits. Sensation intact. Reflexes intact.  SKIN: No ulceration, lesions, rashes, or cyanosis. Skin warm and dry. Turgor intact.  PSYCHIATRIC: Mood, affect within normal limits. The patient is awake, alert and oriented x 3. Insight, judgment intact.  IMAGING    CT ABDOMEN  PELVIS WO CONTRAST  Result Date: 04/04/2021 CLINICAL DATA:  Retroperitoneal hemorrhage suspected. EXAM: CT ABDOMEN AND PELVIS WITHOUT CONTRAST TECHNIQUE: Multidetector CT imaging of the abdomen and pelvis was performed following the standard protocol without IV contrast. COMPARISON:  01/29/2016 FINDINGS: Lower chest: Bibasilar atelectasis noted with tiny bilateral pleural effusions. Hepatobiliary: No focal abnormality in the liver on this study without intravenous contrast. Layering tiny gallstones evident. No intrahepatic or extrahepatic biliary dilation. Pancreas: No focal mass lesion. No dilatation of the main duct. No intraparenchymal cyst. No peripancreatic edema. Spleen: No splenomegaly. No focal mass lesion. Adrenals/Urinary Tract: No adrenal nodule or mass. Small cyst noted upper pole left kidney. No evidence for hydroureter. The urinary bladder appears normal for the degree of distention. Stomach/Bowel: Stomach is unremarkable. No gastric wall thickening. No evidence of outlet obstruction. Duodenum is normally positioned as is the ligament of Treitz. No small bowel wall thickening. No small bowel dilatation. The terminal ileum is normal. The appendix is normal. No gross colonic mass. No colonic wall thickening. Diverticular changes are noted in the left colon without evidence of diverticulitis. 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 Low-attenuation left para-aortic lymph nodes (image 32/2) are stable since 2017 consistent with benign etiology. No pelvic sidewall lymphadenopathy. Reproductive: The prostate gland and seminal vesicles are unremarkable. Other: No intraperitoneal free fluid. Musculoskeletal: Multiple supraumbilical ventral hernias identified, containing only fat. Stable bone island left anterior pubic ramus. No worrisome lytic or sclerotic osseous abnormality. IMPRESSION: 1. No acute findings in the  abdomen or pelvis. Specifically, no findings to suggests retroperitoneal hemorrhage. 2. 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 3. No change in the left para-aortic lymphadenopathy consistent with benign/reactive etiology. 4. Cholelithiasis. 5. Multiple supraumbilical ventral hernias containing only fat. Electronically Signed   By: Misty Stanley M.D.   On: 04/04/2021 07:36   DG Chest 2 View  Result Date: 04/27/2021 CLINICAL DATA:  Chest pain. EXAM: CHEST - 2 VIEW COMPARISON:  None. FINDINGS: Stable position of cardiac pacemaker.  Aortic stent graft noted. Tortuosity and calcific atherosclerotic disease of the aorta. Cardiomediastinal silhouette is normal. Mediastinal contours appear intact. There is no evidence of focal airspace consolidation, pleural effusion or pneumothorax. Probable bilateral small pleural effusions. Osseous structures are without acute abnormality. Soft tissues are grossly normal. IMPRESSION: Probable bilateral small pleural effusions. Electronically Signed   By: Fidela Salisbury M.D.   On: 04/27/2021 13:45   DG Chest 2 View  Result Date: 04/11/2021 CLINICAL DATA:  Shortness of breath EXAM: CHEST - 2 VIEW COMPARISON:  04/03/2021 FINDINGS: No focal consolidation. No pleural effusion or pneumothorax. Heart and mediastinal contours are unremarkable. Dual lead cardiac pacemaker. No acute osseous abnormality. IMPRESSION: No active cardiopulmonary disease. Electronically Signed   By: Kathreen Devoid M.D.   On: 04/11/2021 15:11   DG Chest 2 View  Result Date: 04/03/2021 CLINICAL DATA:  sob EXAM: CHEST - 2 VIEW COMPARISON:  Chest x-ray 09/07/2016, CT chest 12/14/2011. FINDINGS: The heart and mediastinal contours are unchanged. Coronary artery stent. Aortic calcification. The distal descending thoracic aorta/suprarenal abdominal aorta stent noted on lateral view. Three lead cardiac pacemaker/defibrillator overlies the  left chest wall. No focal consolidation. No pulmonary edema. Blunting of bilateral costophrenic angles. No pneumothorax. No acute osseous abnormality. IMPRESSION: Blunting of bilateral costophrenic angles. Trace pleural effusions not excluded. Electronically Signed   By:  Iven Finn M.D.   On: 04/03/2021 22:56   CT CHEST WO CONTRAST  Result Date: 04/12/2021 CLINICAL DATA:  Shortness of breath. EXAM: CT CHEST WITHOUT CONTRAST TECHNIQUE: Multidetector CT imaging of the chest was performed following the standard protocol without IV contrast. COMPARISON:  December 14, 2011 FINDINGS: Cardiovascular: A dual lead AICD is in place. There is moderate to marked severity calcification of the aortic arch and descending thoracic aorta. 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 A stent is noted within the distal aspect of the descending thoracic aorta. This extends to include the visualized portion of the abdominal aorta. Normal heart size with coronary artery stents in place. No pericardial effusion. Mediastinum/Nodes: There is mild AP window and pretracheal lymphadenopathy. Thyroid gland, trachea, and esophagus demonstrate no significant findings. Lungs/Pleura: There is mild emphysematous lung disease involving the bilateral upper lobes. Mild areas of linear scarring and/or atelectasis are noted within the posterior aspects of the bilateral lung bases. There are very small bilateral pleural effusions. No pneumothorax is identified. Upper Abdomen: A thin layer of tiny gallstones is seen within the dependent portion of the gallbladder. Vascular stents are seen within the origins of the celiac artery, superior mesenteric artery and bilateral renal arteries. Musculoskeletal: Multilevel degenerative changes seen throughout the thoracic spine. IMPRESSION: 1. Mild emphysematous lung disease. 2. Mild bibasilar linear scarring and/or atelectasis. 3. Very small bilateral pleural effusions. 4. Extensive  stenting of the distal descending thoracic aorta, as well as the abdominal aorta and several of its branches. 5. Cholelithiasis. Aortic Atherosclerosis (ICD10-I70.0) and Emphysema (ICD10-J43.9). Electronically Signed   By: Virgina Norfolk M.D.   On: 04/12/2021 04:11   NM Pulmonary Perfusion  Result Date: 04/12/2021 CLINICAL DATA:  Evaluate for pulmonary embolus. Shortness of breath. EXAM: NUCLEAR MEDICINE PERFUSION LUNG SCAN TECHNIQUE: Perfusion images were obtained in multiple projections after intravenous injection of radiopharmaceutical. Ventilation scans intentionally deferred if perfusion scan and chest x-ray adequate for interpretation during COVID 19 epidemic. RADIOPHARMACEUTICALS:  4.3 mCi Tc-8m MAA IV COMPARISON:  Chest radiograph 04/11/21 FINDINGS: On the chest radiograph from 04/11/2021 there is a ICD overlying the left chest wall. No focal pulmonary opacities identified. On the perfusion portion of the examination there is a focal peripheral defect overlying the left upper lobe corresponding to the ICD battery pack. Mild heterogeneous distribution of the radiopharmaceutical is noted within both lungs. On the lateral projection images there are large nonsegmental areas of diminished activity within both posterior and upper lung zones which likely represents non-uniform soft tissue attenuation artifact secondary to patient's upper extremities. No peripheral segmental perfusion defects to suggest acute pulmonary embolus. IMPRESSION: 1. No evidence for acute pulmonary embolus. Electronically Signed   By: Kerby Moors M.D.   On: 04/12/2021 11:01   US RENAL  Result Date: 04/08/2021 CLINICAL DATA:  Renal dysfunction. EXAM: RENAL / URINARY TRACT ULTRASOUND COMPLETE COMPARISON:  Abdominal sonogram done on 06/20/2015 FINDINGS: Right Kidney: Renal measurements: 10.9 x 5.3 x 5 cm = volume: 152 mL. There is no hydronephrosis. There is increased cortical echogenicity. Left Kidney: Renal measurements:  11.2 x 5.5 x 4.5 cm = volume: 147 mL. There is no hydronephrosis. There is increased cortical echogenicity. There is 2 cm cyst in the upper pole. Bladder: Appears normal for degree of bladder distention. Other: None. IMPRESSION: There is no hydronephrosis. Increased cortical echogenicity suggests medical renal disease. 2 cm left renal cyst. Electronically Signed   By: Elmer Picker M.D.   On: 04/08/2021 16:11  US Venous Img Lower Bilateral  Result Date: 04/04/2021 CLINICAL DATA:  Bilateral lower extremity edema, dyspnea EXAM: BILATERAL LOWER EXTREMITY VENOUS DOPPLER ULTRASOUND TECHNIQUE: Gray-scale sonography with compression, as well as color and duplex ultrasound, were performed to evaluate the deep venous system(s) from the level of the common femoral vein through the popliteal and proximal calf veins. COMPARISON:  None. FINDINGS: VENOUS Normal compressibility of the common femoral, superficial femoral, and popliteal veins, as well as the visualized calf veins. Visualized portions of profunda femoral vein and great saphenous vein unremarkable. No filling defects to suggest DVT on grayscale or color Doppler imaging. Doppler waveforms show normal direction of venous flow, normal respiratory plasticity and response to augmentation. OTHER None. Limitations: none IMPRESSION: Negative. Electronically Signed   By: Fidela Salisbury M.D.   On: 04/04/2021 03:32   US Venous Img Upper Uni Left  Result Date: 04/11/2021 CLINICAL DATA:  Left upper extremity pain and swelling. EXAM: LEFT UPPER EXTREMITY VENOUS DOPPLER ULTRASOUND TECHNIQUE: Gray-scale sonography with graded compression, as well as color Doppler and duplex ultrasound were performed to evaluate the upper extremity deep venous system from the level of the subclavian vein and including the jugular, axillary, basilic, radial, ulnar and upper cephalic vein. Spectral Doppler was utilized to evaluate flow at rest and with distal augmentation maneuvers.  COMPARISON:  None. FINDINGS: Contralateral Subclavian Vein: Respiratory phasicity is normal and symmetric with the symptomatic side. No evidence of thrombus. Normal compressibility. Internal Jugular Vein: No evidence of thrombus. Normal compressibility, respiratory phasicity and response to augmentation. Subclavian Vein: No evidence of thrombus. Normal compressibility, respiratory phasicity and response to augmentation. Axillary Vein: No evidence of thrombus. Normal compressibility, respiratory phasicity and response to augmentation. Cephalic Vein: No evidence of thrombus. Normal compressibility, respiratory phasicity and response to augmentation. Basilic Vein: No evidence of thrombus. Normal compressibility, respiratory phasicity and response to augmentation. Brachial Veins: No evidence of thrombus. Normal compressibility, respiratory phasicity and response to augmentation. Radial Veins: No evidence of thrombus. Normal compressibility, respiratory phasicity and response to augmentation. Ulnar Veins: No evidence of thrombus. Normal compressibility, respiratory phasicity and response to augmentation. Venous Reflux:  None visualized. Other Findings:  None visualized. IMPRESSION: No evidence of DVT within the LEFT upper extremity. Electronically Signed   By: Virgina Norfolk M.D.   On: 04/11/2021 20:25   ECHOCARDIOGRAM COMPLETE  Result Date: 04/06/2021    ECHOCARDIOGRAM REPORT   Patient Name:   Peter [name] Date of Exam: 04/04/2021 Medical Rec #:  [id number]        Height:       70.0 in Accession #:    [id number]       Weight:       220.0 lb Date of Birth:  14-Jul-1940       BSA:          2.174 m Patient Age:    72 years         BP:           198/65 mmHg Patient Gender: M                HR:           89 bpm. Exam Location:  [facility] Procedure: 2D Echo, Cardiac Doppler and Color Doppler Indications:     Elevated Troponin  History:         Patient has prior history of Echocardiogram examinations, most  recent 08/08/2016. CHF, Previous Myocardial Infarction; Risk                  Factors:Hypertension.  Sonographer:     Sherrie Sport Referring Phys:  [id number] Diagnosing Phys: Yolonda Kida MD  Sonographer Comments: Suboptimal parasternal window. IMPRESSIONS  1. Left ventricular ejection fraction, by estimation, is 50 to 55%. The left ventricle has low normal function. The left ventricle has no regional wall motion abnormalities. The left ventricular internal cavity size was mildly dilated. There is mild left ventricular hypertrophy. Left ventricular diastolic parameters are consistent with Grade II diastolic dysfunction (pseudonormalization).  2. Right ventricular systolic function is low normal. The right ventricular size is moderately enlarged.  3. The mitral valve is normal in structure. Mild mitral valve regurgitation.  4. The aortic valve is normal in structure. Aortic valve regurgitation is mild. FINDINGS  Left Ventricle: Left ventricular ejection fraction, by estimation, is 50 to 55%. The left ventricle has low normal function. The left ventricle has no regional wall motion abnormalities. The left ventricular internal cavity size was mildly dilated. There is mild left ventricular hypertrophy. Left ventricular diastolic parameters are consistent with Grade II diastolic dysfunction (pseudonormalization). Right Ventricle: The right ventricular size is moderately enlarged. No increase in right ventricular wall thickness. Right ventricular systolic function is low normal. Left Atrium: Left atrial size was normal in size. Right Atrium: Right atrial size was normal in size. Pericardium: There is no evidence of pericardial effusion. Mitral Valve: The mitral valve is normal in structure. Mild mitral valve regurgitation. Tricuspid Valve: The tricuspid valve is normal in structure. Tricuspid valve regurgitation is mild. Aortic Valve: The aortic valve is normal in structure. Aortic valve regurgitation is  mild. Aortic valve mean gradient measures 4.0 mmHg. Aortic valve peak gradient measures 6.7 mmHg. Aortic valve area, by VTI measures 3.09 cm. Pulmonic Valve: The pulmonic valve was normal in structure. Pulmonic valve regurgitation is not visualized. Aorta: The ascending aorta was not well visualized. IAS/Shunts: No atrial level shunt detected by color flow Doppler. Additional Comments: A device lead is visualized. There is no pleural effusion.  LEFT VENTRICLE PLAX 2D LVIDd:         4.90 cm   Diastology LVIDs:         3.30 cm   LV e' medial:    7.07 cm/s LV PW:         1.50 cm   LV E/e' medial:  14.0 LV IVS:        1.05 cm   LV e' lateral:   8.27 cm/s LVOT diam:     2.10 cm   LV E/e' lateral: 12.0 LV SV:         86 LV SV Index:   40 LVOT Area:     3.46 cm  RIGHT VENTRICLE RV Basal diam:  4.40 cm RV S prime:     14.40 cm/s TAPSE (M-mode): 4.7 cm LEFT ATRIUM           Index        RIGHT ATRIUM           Index LA diam:      2.90 cm 1.33 cm/m   RA Area:     27.00 cm LA Vol (A2C): 45.6 ml 20.98 ml/m  RA Volume:   85.90 ml  39.52 ml/m LA Vol (A4C): 56.1 ml 25.81 ml/m  AORTIC VALVE  PULMONIC VALVE AV Area (Vmax):    3.01 cm     PV Vmax:        0.70 m/s AV Area (Vmean):   3.08 cm     PV Vmean:       44.100 cm/s AV Area (VTI):     3.09 cm     PV VTI:         0.107 m AV Vmax:           129.00 cm/s  PV Peak grad:   1.9 mmHg AV Vmean:          88.400 cm/s  PV Mean grad:   1.0 mmHg AV VTI:            0.279 m      RVOT Peak grad: 3 mmHg AV Peak Grad:      6.7 mmHg AV Mean Grad:      4.0 mmHg LVOT Vmax:         112.00 cm/s LVOT Vmean:        78.700 cm/s LVOT VTI:          0.249 m LVOT/AV VTI ratio: 0.89  AORTA Ao Root diam: 3.40 cm MITRAL VALVE               TRICUSPID VALVE MV Area (PHT): 4.83 cm    TR Peak grad:   40.4 mmHg MV Decel Time: 157 msec    TR Vmax:        318.00 cm/s MV E velocity: 99.00 cm/s MV A velocity: 87.00 cm/s  SHUNTS MV E/A ratio:  1.14        Systemic VTI:  0.25 m                             Systemic Diam: 2.10 cm                            Pulmonic VTI:  0.149 m Dwayne D Callwood MD Electronically signed by Yolonda Kida MD Signature Date/Time: 04/06/2021/11:32:38 AM    Final       ASSESSMENT/PLAN   Acute on chronic hypoxemic respiratory failure   - Patient is a lifelong smoker, we discussed smoking cessation    -I spoke with [name] his daughter and she reports substantial anxiety due to air hunger and breathlessness  - Patient has acute on chronic CHF with 3rd re-admission  -Acute renal failure - nephrology on case appreciate input -We discussed COPD and outpatient follow up     Acute exacerbation of COPD  With bronchiectasis and mild mucus plugging bilaterally per CT Chest  - COPD care path  - incentive spirometry and flutter valve  - PT/OT-patient   - prednisone 10mg   - duoneb - q6hprn  - zithromax 250 po daliy    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      - he had hematuria for some time after this infection     - nephrology on case appreciate input    Thank you for allowing me to participate in the care of this patient.  Total face to face encounter time for this patient visit was >45 min. >50% of the time was  spent in counseling and coordination of care.   Patient/Family are satisfied with care plan and all  questions have been answered.  This document was prepared using Dragon voice recognition software and may include unintentional dictation errors.     Ottie Glazier, M.D.  Division of [facility]

## 2021-04-30 NOTE — Progress Notes (Signed)
Notified by CCMD of 6-8 beat runs of VT.  Pt resting quietly in bed.  No firing of AICD noted.

## 2021-04-30 NOTE — Progress Notes (Signed)
Mobility Specialist - Progress Note   04/30/21 1458  Mobility  Activity Ambulated with assistance in hallway  Level of Assistance Standby assist, set-up cues, supervision of patient - no hands on  Assistive Device None  Distance Ambulated (ft) 220 ft  Activity Response Tolerated well  $Mobility charge 1 Mobility    Pre-mobility: 70 HR, 95% SpO2 During mobility: 77 HR, 91-92% SpO2 Post-mobility: 71 HR, 93% SpO2   Pt ambulated in hallway with supervision. No AD, no LOB. Does voice SOB on RA, O2 maintained > 90% throughout. Pt left EOB with alarm set, needs in reach. RN notified.    Kathee Delton Mobility Specialist 04/30/21, 3:01 PM

## 2021-04-30 NOTE — Progress Notes (Signed)
CARDIOLOGY CONSULT NOTE    Patient ID: Peter [name] MRN: [id number] DOB/AGE: 1940/08/23 81 y.o.  Admit date: 04/27/2021 Referring Physician Eugenie Norrie Primary Physician Mortimer Fries Primary Cardiologist Lujean Amel Reason for Consultation heart failure  HPI: The patient is an 81 year old male with a past medical history significant for HFpEF (LVEF 50-55%), ischemic cardiomyopathy s/p ICD placement 09/2017, CAD s/p DES x3, history of thoracic aortic aneurysm s/p fenestrated repair 2015, hypertension, hypercholesterolemia, CKD 4, tobacco use who presented to [facility] ED 04/27/2021 with shortness of breath.  Cardiology is consulted for management of his heart failure.  Interval history: -admits to nasal congestion and states he can't breathe well because of it. Denies chest pain.  -Cr with significant bump overnight, nephrology following who recommends keeping PO lasix today and stopping spironolactone and ARB.  -weight remains stable, no intake recorded but UOP 1.7L overnight.   Review of systems complete and found to be negative unless listed above     Past Medical History:  Diagnosis Date   Anginal pain ([place])    Asthma    Bladder infection, acute 03/2011   "had a whole lot of bleeding from this"   CHF (congestive heart failure) ([place])    Coronary artery disease    High cholesterol    Hypertension    Macular degeneration 12/14/2011   "had it in my right; getting shots now in my left"   Myocardial infarction [facility]) 1996   NSTEMI (non-ST elevated myocardial infarction) ([place]) 12/14/2011   Shortness of breath 12/14/2011   "@ rest, lying down, w/exertion"    Past Surgical History:  Procedure Laterality Date   [place]; ~ 2003; ~ 2008   "total of 3"   CORONARY STENT INTERVENTION N/A 08/10/2016   Procedure: Coronary Stent Intervention;  Surgeon: [name], MD;  Location: [place] CV LAB;  Service: Cardiovascular;  Laterality: N/A;    HERNIA REPAIR  ~ 2001   "abdominal w/mesh implanted"   LEFT HEART CATH AND CORONARY ANGIOGRAPHY N/A 08/10/2016   Procedure: Left Heart Cath and Coronary Angiography;  Surgeon: [name], MD;  Location: [place] CV [facility];  Service: Cardiovascular;  Laterality: N/A;   LEFT HEART CATHETERIZATION WITH CORONARY ANGIOGRAM N/A 12/16/2011   Procedure: LEFT HEART CATHETERIZATION WITH CORONARY ANGIOGRAM;  Surgeon: Minus Breeding, MD;  Location: [facility] CATH LAB;  Service: Cardiovascular;  Laterality: N/A;   PERCUTANEOUS CORONARY STENT INTERVENTION (PCI-S) N/A 12/17/2011   Procedure: PERCUTANEOUS CORONARY STENT INTERVENTION (PCI-S);  Surgeon: Sherren Mocha, MD;  Location: [facility] CATH LAB;  Service: Cardiovascular;  Laterality: N/A;   TONSILLECTOMY     "I was a kid"    Medications Prior to Admission  Medication Sig Dispense Refill Last Dose   atorvastatin (LIPITOR) 40 MG tablet Take 1 tablet (40 mg total) by mouth daily at 6 PM. 30 tablet 0 04/26/2021 at 2000   carvedilol (COREG) 6.25 MG tablet Take 6.25 mg by mouth 2 (two) times daily with a meal.   04/27/2021 at 0800   cetirizine (ZYRTEC) 10 MG tablet Take 10 mg by mouth.      clobetasol cream (TEMOVATE) 0.73 % Apply 1 application topically 2 (two) times daily as needed (psoriasis).  1 Unknown at PRN   clopidogrel (PLAVIX) 75 MG tablet TAKE 1 TABLET(75 MG) BY MOUTH EVERY DAY 30 tablet 1 04/27/2021 at 0800   cyanocobalamin 1000 MCG tablet Take 1,000 mcg by mouth daily.       finasteride (PROSCAR) 5 MG tablet  Take 1 tablet (5 mg total) by mouth daily. 90 tablet 3 04/28/2021 at 0800   furosemide (LASIX) 20 MG tablet Hold this medication until followup with nephrology or cardiology. (Patient taking differently: Take 20 mg by mouth daily.) 30 tablet  04/28/2021 at 0800   GLUCOSAMINE-CHONDROITIN-VIT C PO Take 1 tablet by mouth daily.      hydrALAZINE (APRESOLINE) 100 MG tablet Take 1 tablet (100 mg total) by mouth every 8 (eight) hours. (Patient taking  differently: Take 100 mg by mouth every 12 (twelve) hours.) 270 tablet 0 04/27/2021 at 0800   isosorbide mononitrate (IMDUR) 30 MG 24 hr tablet Take 30 mg by mouth daily.   04/27/2021 at 0800   omeprazole (PRILOSEC) 20 MG capsule Take 20 mg by mouth in the morning.   04/27/2021 at 0800   tamsulosin (FLOMAX) 0.4 MG CAPS capsule Take 1 capsule (0.4 mg total) by mouth daily. 90 capsule 3 04/27/2021 at 0800   calcitRIOL (ROCALTROL) 0.25 MCG capsule Take 0.25 mcg by mouth daily.      losartan (COZAAR) 100 MG tablet Hold until followup with your outpatient doctor due to worsening kidney function.      spironolactone (ALDACTONE) 25 MG tablet Hold until followup with your outpatient doctor due to worsening kidney function.       Social History   Socioeconomic History   Marital status: Widowed    Spouse name: Not on file   Number of children: Not on file   Years of education: Not on file   Highest education level: Not on file  Occupational History   Not on file  Tobacco Use   Smoking status: Former    Packs/day: 0.50    Years: 0.50    Pack years: 0.25    Types: Cigarettes   Smokeless tobacco: Never   Tobacco comments:    11/23/16 Still not ready to quit smoking.  Substance and Sexual Activity   Alcohol use: Yes    Alcohol/week: 1.0 standard drink    Types: 1 Cans of beer per week    Comment: 12/14/2011 "if my kidneys are sore, I drink 1 beer/day; if not sore; no beer; occasionally drink socially; ave 1 beer/wk maybe"   Drug use: No   Sexual activity: Not Currently  Other Topics Concern   Not on file  Social History Narrative   Not on file   Social Determinants of Health   Financial Resource Strain: Not on file  Food Insecurity: Not on file  Transportation Needs: Not on file  Physical Activity: Not on file  Stress: Not on file  Social Connections: Not on file  Intimate Partner Violence: Not on file    Family History  Family history unknown: Yes      Review of systems complete  and found to be negative unless listed above    PHYSICAL EXAM General: Elderly caucasian male, well nourished, in no acute distress. Sitting upright with legs off of bed eating breakfast.  HEENT:  Normocephalic and atraumatic. Neck:  No JVD.  Lungs: Normal respiratory effort on 2L by [place]. Clear bilaterally to auscultation.  No wheezes crackles, rhonchi.  Heart: HRRR . Normal S1 and S2 without gallops or murmurs.  Abdomen: Non-distended appearing.  Msk: Normal strength and tone for age. Extremities: No clubbing, cyanosis or edema.   Neuro: Alert and oriented X 3. Psych:  Mood appropriate, affect congruent.   Labs:   Lab Results  Component Value Date   WBC 6.3 04/27/2021   HGB 10.9 (  L) 04/27/2021   HCT 33.1 (L) 04/27/2021   MCV 90.9 04/27/2021   PLT 144 (L) 04/27/2021    Recent Labs  Lab 04/30/21 0729  NA 139  K 3.8  CL 105  CO2 28  BUN 46*  CREATININE 3.67*  CALCIUM 8.1*  GLUCOSE 87    Lab Results  Component Value Date   CKTOTAL 23 (L) 12/11/2013   CKMB 1.0 12/11/2013   TROPONINI 0.05 (HH) 12/21/2017     Lab Results  Component Value Date   CHOL 108 08/08/2016   CHOL 141 12/16/2011   Lab Results  Component Value Date   HDL 36 (L) 08/08/2016   HDL 38 (L) 12/16/2011   Lab Results  Component Value Date   LDLCALC 50 08/08/2016   LDLCALC 83 12/16/2011   Lab Results  Component Value Date   TRIG 111 08/08/2016   TRIG 102 12/16/2011   Lab Results  Component Value Date   CHOLHDL 3.0 08/08/2016   CHOLHDL 3.7 12/16/2011   No results found for: LDLDIRECT    Radiology: CT ABDOMEN PELVIS WO CONTRAST  Result Date: 04/04/2021 CLINICAL DATA:  Retroperitoneal hemorrhage suspected. EXAM: CT ABDOMEN AND PELVIS WITHOUT CONTRAST TECHNIQUE: Multidetector CT imaging of the abdomen and pelvis was performed following the standard protocol without IV contrast. COMPARISON:  01/29/2016 FINDINGS: Lower chest: Bibasilar atelectasis noted with tiny bilateral pleural effusions.  Hepatobiliary: No focal abnormality in the liver on this study without intravenous contrast. Layering tiny gallstones evident. No intrahepatic or extrahepatic biliary dilation. Pancreas: No focal mass lesion. No dilatation of the main duct. No intraparenchymal cyst. No peripancreatic edema. Spleen: No splenomegaly. No focal mass lesion. Adrenals/Urinary Tract: No adrenal nodule or mass. Small cyst noted upper pole left kidney. No evidence for hydroureter. The urinary bladder appears normal for the degree of distention. Stomach/Bowel: Stomach is unremarkable. No gastric wall thickening. No evidence of outlet obstruction. Duodenum is normally positioned as is the ligament of Treitz. No small bowel wall thickening. No small bowel dilatation. The terminal ileum is normal. The appendix is normal. No gross colonic mass. No colonic wall thickening. Diverticular changes are noted in the left colon without evidence of diverticulitis. 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 Low-attenuation left para-aortic lymph nodes (image 32/2) are stable since 2017 consistent with benign etiology. No pelvic sidewall lymphadenopathy. Reproductive: The prostate gland and seminal vesicles are unremarkable. Other: No intraperitoneal free fluid. Musculoskeletal: Multiple supraumbilical ventral hernias identified, containing only fat. Stable bone island left anterior pubic ramus. No worrisome lytic or sclerotic osseous abnormality. IMPRESSION: 1. No acute findings in the abdomen or pelvis. Specifically, no findings to suggests retroperitoneal hemorrhage. 2. 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 3. No change in the left para-aortic lymphadenopathy consistent with benign/reactive etiology. 4. Cholelithiasis. 5. Multiple supraumbilical ventral hernias containing only fat.  Electronically Signed   By: Misty Stanley M.D.   On: 04/04/2021 07:36   DG Chest 2 View  Result Date: 04/27/2021 CLINICAL DATA:  Chest pain. EXAM: CHEST - 2 VIEW COMPARISON:  None. FINDINGS: Stable position of cardiac pacemaker.  Aortic stent graft noted. Tortuosity and calcific atherosclerotic disease of the aorta. Cardiomediastinal silhouette is normal. Mediastinal contours appear intact. There is no evidence of focal airspace consolidation, pleural effusion or pneumothorax. Probable bilateral small pleural effusions. Osseous structures are without acute abnormality. Soft tissues are grossly normal. IMPRESSION: Probable  bilateral small pleural effusions. Electronically Signed   By: Fidela Salisbury M.D.   On: 04/27/2021 13:45   DG Chest 2 View  Result Date: 04/11/2021 CLINICAL DATA:  Shortness of breath EXAM: CHEST - 2 VIEW COMPARISON:  04/03/2021 FINDINGS: No focal consolidation. No pleural effusion or pneumothorax. Heart and mediastinal contours are unremarkable. Dual lead cardiac pacemaker. No acute osseous abnormality. IMPRESSION: No active cardiopulmonary disease. Electronically Signed   By: Kathreen Devoid M.D.   On: 04/11/2021 15:11   DG Chest 2 View  Result Date: 04/03/2021 CLINICAL DATA:  sob EXAM: CHEST - 2 VIEW COMPARISON:  Chest x-ray 09/07/2016, CT chest 12/14/2011. FINDINGS: The heart and mediastinal contours are unchanged. Coronary artery stent. Aortic calcification. The distal descending thoracic aorta/suprarenal abdominal aorta stent noted on lateral view. Three lead cardiac pacemaker/defibrillator overlies the left chest wall. No focal consolidation. No pulmonary edema. Blunting of bilateral costophrenic angles. No pneumothorax. No acute osseous abnormality. IMPRESSION: Blunting of bilateral costophrenic angles. Trace pleural effusions not excluded. Electronically Signed   By: Iven Finn M.D.   On: 04/03/2021 22:56   CT CHEST WO CONTRAST  Result Date: 04/12/2021 CLINICAL  DATA:  Shortness of breath. EXAM: CT CHEST WITHOUT CONTRAST TECHNIQUE: Multidetector CT imaging of the chest was performed following the standard protocol without IV contrast. COMPARISON:  December 14, 2011 FINDINGS: Cardiovascular: A dual lead AICD is in place. There is moderate to marked severity calcification of the aortic arch and descending thoracic aorta. 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 A stent is noted within the distal aspect of the descending thoracic aorta. This extends to include the visualized portion of the abdominal aorta. Normal heart size with coronary artery stents in place. No pericardial effusion. Mediastinum/Nodes: There is mild AP window and pretracheal lymphadenopathy. Thyroid gland, trachea, and esophagus demonstrate no significant findings. Lungs/Pleura: There is mild emphysematous lung disease involving the bilateral upper lobes. Mild areas of linear scarring and/or atelectasis are noted within the posterior aspects of the bilateral lung bases. There are very small bilateral pleural effusions. No pneumothorax is identified. Upper Abdomen: A thin layer of tiny gallstones is seen within the dependent portion of the gallbladder. Vascular stents are seen within the origins of the celiac artery, superior mesenteric artery and bilateral renal arteries. Musculoskeletal: Multilevel degenerative changes seen throughout the thoracic spine. IMPRESSION: 1. Mild emphysematous lung disease. 2. Mild bibasilar linear scarring and/or atelectasis. 3. Very small bilateral pleural effusions. 4. Extensive stenting of the distal descending thoracic aorta, as well as the abdominal aorta and several of its branches. 5. Cholelithiasis. Aortic Atherosclerosis (ICD10-I70.0) and Emphysema (ICD10-J43.9). Electronically Signed   By: Virgina Norfolk M.D.   On: 04/12/2021 04:11   NM Pulmonary Perfusion  Result Date: 04/12/2021 CLINICAL DATA:  Evaluate for pulmonary embolus. Shortness of  breath. EXAM: NUCLEAR MEDICINE PERFUSION LUNG SCAN TECHNIQUE: Perfusion images were obtained in multiple projections after intravenous injection of radiopharmaceutical. Ventilation scans intentionally deferred if perfusion scan and chest x-ray adequate for interpretation during COVID 19 epidemic. RADIOPHARMACEUTICALS:  4.3 mCi Tc-94m MAA IV COMPARISON:  Chest radiograph 04/11/21 FINDINGS: On the chest radiograph from 04/11/2021 there is a ICD overlying the left chest wall. No focal pulmonary opacities identified. On the perfusion portion of the examination there is a focal peripheral defect overlying the left upper lobe corresponding to the ICD battery pack. Mild heterogeneous distribution of the radiopharmaceutical is noted within both lungs. On the lateral projection images there are large nonsegmental areas of  diminished activity within both posterior and upper lung zones which likely represents non-uniform soft tissue attenuation artifact secondary to patient's upper extremities. No peripheral segmental perfusion defects to suggest acute pulmonary embolus. IMPRESSION: 1. No evidence for acute pulmonary embolus. Electronically Signed   By: Kerby Moors M.D.   On: 04/12/2021 11:01   US RENAL  Result Date: 04/08/2021 CLINICAL DATA:  Renal dysfunction. EXAM: RENAL / URINARY TRACT ULTRASOUND COMPLETE COMPARISON:  Abdominal sonogram done on 06/20/2015 FINDINGS: Right Kidney: Renal measurements: 10.9 x 5.3 x 5 cm = volume: 152 mL. There is no hydronephrosis. There is increased cortical echogenicity. Left Kidney: Renal measurements: 11.2 x 5.5 x 4.5 cm = volume: 147 mL. There is no hydronephrosis. There is increased cortical echogenicity. There is 2 cm cyst in the upper pole. Bladder: Appears normal for degree of bladder distention. Other: None. IMPRESSION: There is no hydronephrosis. Increased cortical echogenicity suggests medical renal disease. 2 cm left renal cyst. Electronically Signed   By: Elmer Picker M.D.   On: 04/08/2021 16:11   US Venous Img Lower Bilateral  Result Date: 04/04/2021 CLINICAL DATA:  Bilateral lower extremity edema, dyspnea EXAM: BILATERAL LOWER EXTREMITY VENOUS DOPPLER ULTRASOUND TECHNIQUE: Gray-scale sonography with compression, as well as color and duplex ultrasound, were performed to evaluate the deep venous system(s) from the level of the common femoral vein through the popliteal and proximal calf veins. COMPARISON:  None. FINDINGS: VENOUS Normal compressibility of the common femoral, superficial femoral, and popliteal veins, as well as the visualized calf veins. Visualized portions of profunda femoral vein and great saphenous vein unremarkable. No filling defects to suggest DVT on grayscale or color Doppler imaging. Doppler waveforms show normal direction of venous flow, normal respiratory plasticity and response to augmentation. OTHER None. Limitations: none IMPRESSION: Negative. Electronically Signed   By: Fidela Salisbury M.D.   On: 04/04/2021 03:32   US Venous Img Upper Uni Left  Result Date: 04/11/2021 CLINICAL DATA:  Left upper extremity pain and swelling. EXAM: LEFT UPPER EXTREMITY VENOUS DOPPLER ULTRASOUND TECHNIQUE: Gray-scale sonography with graded compression, as well as color Doppler and duplex ultrasound were performed to evaluate the upper extremity deep venous system from the level of the subclavian vein and including the jugular, axillary, basilic, radial, ulnar and upper cephalic vein. Spectral Doppler was utilized to evaluate flow at rest and with distal augmentation maneuvers. COMPARISON:  None. FINDINGS: Contralateral Subclavian Vein: Respiratory phasicity is normal and symmetric with the symptomatic side. No evidence of thrombus. Normal compressibility. Internal Jugular Vein: No evidence of thrombus. Normal compressibility, respiratory phasicity and response to augmentation. Subclavian Vein: No evidence of thrombus. Normal compressibility,  respiratory phasicity and response to augmentation. Axillary Vein: No evidence of thrombus. Normal compressibility, respiratory phasicity and response to augmentation. Cephalic Vein: No evidence of thrombus. Normal compressibility, respiratory phasicity and response to augmentation. Basilic Vein: No evidence of thrombus. Normal compressibility, respiratory phasicity and response to augmentation. Brachial Veins: No evidence of thrombus. Normal compressibility, respiratory phasicity and response to augmentation. Radial Veins: No evidence of thrombus. Normal compressibility, respiratory phasicity and response to augmentation. Ulnar Veins: No evidence of thrombus. Normal compressibility, respiratory phasicity and response to augmentation. Venous Reflux:  None visualized. Other Findings:  None visualized. IMPRESSION: No evidence of DVT within the LEFT upper extremity. Electronically Signed   By: Virgina Norfolk M.D.   On: 04/11/2021 20:25   ECHOCARDIOGRAM COMPLETE  Result Date: 04/06/2021    ECHOCARDIOGRAM REPORT   Patient Name:   Peter [name] Date  of Exam: 04/04/2021 Medical Rec #:  [id number]        Height:       70.0 in Accession #:    [id number]       Weight:       220.0 lb Date of Birth:  [birth year]       BSA:          2.174 m Patient Age:    33 years         BP:           198/65 mmHg Patient Gender: M                HR:           89 bpm. Exam Location:  [facility] Procedure: 2D Echo, Cardiac Doppler and Color Doppler Indications:     Elevated Troponin  History:         Patient has prior history of Echocardiogram examinations, most                  recent 08/08/2016. CHF, Previous Myocardial Infarction; Risk                  Factors:Hypertension.  Sonographer:     Sherrie Sport Referring Phys:  [id number] Diagnosing Phys: Yolonda Kida MD  Sonographer Comments: Suboptimal parasternal window. IMPRESSIONS  1. Left ventricular ejection fraction, by estimation, is 50 to 55%. The left ventricle has low  normal function. The left ventricle has no regional wall motion abnormalities. The left ventricular internal cavity size was mildly dilated. There is mild left ventricular hypertrophy. Left ventricular diastolic parameters are consistent with Grade II diastolic dysfunction (pseudonormalization).  2. Right ventricular systolic function is low normal. The right ventricular size is moderately enlarged.  3. The mitral valve is normal in structure. Mild mitral valve regurgitation.  4. The aortic valve is normal in structure. Aortic valve regurgitation is mild. FINDINGS  Left Ventricle: Left ventricular ejection fraction, by estimation, is 50 to 55%. The left ventricle has low normal function. The left ventricle has no regional wall motion abnormalities. The left ventricular internal cavity size was mildly dilated. There is mild left ventricular hypertrophy. Left ventricular diastolic parameters are consistent with Grade II diastolic dysfunction (pseudonormalization). Right Ventricle: The right ventricular size is moderately enlarged. No increase in right ventricular wall thickness. Right ventricular systolic function is low normal. Left Atrium: Left atrial size was normal in size. Right Atrium: Right atrial size was normal in size. Pericardium: There is no evidence of pericardial effusion. Mitral Valve: The mitral valve is normal in structure. Mild mitral valve regurgitation. Tricuspid Valve: The tricuspid valve is normal in structure. Tricuspid valve regurgitation is mild. Aortic Valve: The aortic valve is normal in structure. Aortic valve regurgitation is mild. Aortic valve mean gradient measures 4.0 mmHg. Aortic valve peak gradient measures 6.7 mmHg. Aortic valve area, by VTI measures 3.09 cm. Pulmonic Valve: The pulmonic valve was normal in structure. Pulmonic valve regurgitation is not visualized. Aorta: The ascending aorta was not well visualized. IAS/Shunts: No atrial level shunt detected by color flow Doppler.  Additional Comments: A device lead is visualized. There is no pleural effusion.  LEFT VENTRICLE PLAX 2D LVIDd:         4.90 cm   Diastology LVIDs:         3.30 cm   LV e' medial:    7.07 cm/s LV PW:         1.50 cm  LV E/e' medial:  14.0 LV IVS:        1.05 cm   LV e' lateral:   8.27 cm/s LVOT diam:     2.10 cm   LV E/e' lateral: 12.0 LV SV:         86 LV SV Index:   40 LVOT Area:     3.46 cm  RIGHT VENTRICLE RV Basal diam:  4.40 cm RV S prime:     14.40 cm/s TAPSE (M-mode): 4.7 cm LEFT ATRIUM           Index        RIGHT ATRIUM           Index LA diam:      2.90 cm 1.33 cm/m   RA Area:     27.00 cm LA Vol (A2C): 45.6 ml 20.98 ml/m  RA Volume:   85.90 ml  39.52 ml/m LA Vol (A4C): 56.1 ml 25.81 ml/m  AORTIC VALVE                    PULMONIC VALVE AV Area (Vmax):    3.01 cm     PV Vmax:        0.70 m/s AV Area (Vmean):   3.08 cm     PV Vmean:       44.100 cm/s AV Area (VTI):     3.09 cm     PV VTI:         0.107 m AV Vmax:           129.00 cm/s  PV Peak grad:   1.9 mmHg AV Vmean:          88.400 cm/s  PV Mean grad:   1.0 mmHg AV VTI:            0.279 m      RVOT Peak grad: 3 mmHg AV Peak Grad:      6.7 mmHg AV Mean Grad:      4.0 mmHg LVOT Vmax:         112.00 cm/s LVOT Vmean:        78.700 cm/s LVOT VTI:          0.249 m LVOT/AV VTI ratio: 0.89  AORTA Ao Root diam: 3.40 cm MITRAL VALVE               TRICUSPID VALVE MV Area (PHT): 4.83 cm    TR Peak grad:   40.4 mmHg MV Decel Time: 157 msec    TR Vmax:        318.00 cm/s MV E velocity: 99.00 cm/s MV A velocity: 87.00 cm/s  SHUNTS MV E/A ratio:  1.14        Systemic VTI:  0.25 m                            Systemic Diam: 2.10 cm                            Pulmonic VTI:  0.149 m Yolonda Kida MD Electronically signed by Yolonda Kida MD Signature Date/Time: 04/06/2021/11:32:38 AM    Final     ECHO 04/04/2021 LVEF 50-55% without wall motion abnormalities, mild LV dilation, mild LVH, grade 2 diastolic dysfunction, mild MR, mild AR  TELEMETRY reviewed  by me: NSR LBBB PVCs rate 80  EKG reviewed by me: Normal sinus rhythm, LBBB,  multiform PVCs rate 78  ASSESSMENT AND PLAN:  The patient is an 81 year old male with a past medical history significant for HFpEF (LVEF 50-55%) 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  Cardiology is consulted for management of his heart failure.  #Acute on chronic HFpEF (LVEF 50-55%) / ICM s/p ICD placement 09/2017 #acute exacerbation of COPD #PVCs #Elevated troponin d/t demand ischemia #AKI on CKD 4 The patient was recently hospitalized from 12/22 - 12/28 and finished his rehab stay on the day of admission 1/15. He was not started back on his lasix at discharge due to an AKI and presented to the hospital slightly volume overloaded and SOB. The pt states he feels like not being on supplemental oxygen is what is making him SOB. There is a likely a dietary indiscretion component to his current presentation also. Additionally, the pt's blood pressure was severely elevated on admission and troponins are elevated too likely as a result. He denies chest pain.  -Agree with monitoring and replenishing electrolytes as needed. -appreciate nephrology recommendations, will hold spironolactone and ARB.  -S/p IV Lasix 40 mg BID with good UOP. Transitioned to PO dosing today.  -bump in Cr overnight to 3.67 and GFR down to 16 (was improving until this morning at 2.81 and 22) -agree with pulmonology who recommends duonebs, prednisone, and azithromycin for COPD exacerbation  #Hypertensive urgency  #CAD s/p DES x3 Agree with hydralazine 100 mg every 8 hours, blood pressure labile Home medication regimen includes amlodipine 10mg , carvedilol 6.25 mg twice daily  #Dyslipidemia Continue high intensity statin Lipitor 40 mg once  daily  This patient's plan of care was discussed and created with Dr. Serafina Royals and he is in agreement.  Signed: Tristan Schroeder , PA-C 04/30/2021, 9:23 AM

## 2021-04-30 NOTE — Progress Notes (Signed)
[facility] Progress Note  Patient: Peter [name]    UTM:[id number]  DOA: 04/27/2021     Date of Service: the patient was seen and examined on 04/30/2021  Chief Complaint  Patient presents with   Shortness of Breath   Brief hospital course: 81 year old with past medical history significant for systolic and diastolic heart failure, CAD, hypertension, dyslipidemia, non-STEMI status post PCI on steroids, status post AICD who presents to the ED with acute onset of worsening shortness of breath.  He describes orthopnea, paroxysmal nocturnal dyspnea, exertional dyspnea.  Recent admission 12/22 until 12/28 for symptomatic anemia, received blood transfusion, treatment for heart failure exacerbation with diuresis.  His Lasix was discontinued at discharge due to AKI on CKD stage IV.  He has not been taking Lasix since her last hospitalization.  AICD was interrogated by ER physician and show increased PVCs.   Evaluation in the ED patient was noted to be tensive systolic blood pressure 568, tachypneic respiration rate 25, BUN 40, creatinine 2.8 prior creatinine 3.2 on 9/9.  Last admission creatinine at discharge was 3.1.  BNP elevated 2988.  Troponin mildly elevated to 253--- 259. Chest x-ray showed bilateral small pleural effusion.   Patient admitted for further evaluation and treatment of heart failure exacerbation.     Assessment and Plan: Principal Problem:   Acute exacerbation of CHF (congestive heart failure) (HCC)   Acute on chronic diastolic heart failure exacerbation: His systolic heart failure has resolved since 2D echo 04/04/2021.  Last ejection fraction 50 to 55%. -s/p IV Lasix, switch to Lasix 40 mg p.o. twice daily as per cardiology -Hold spironolactone and ACE due to CKD. -Nephrology as well to assist with diuresis in the setting of CKD stage IV -Appreciate cardiology assistance. -Patient will likely require home oxygen. Evaluate for Home oxygen needs.  -Dyspnea improved.     Hypertensive urgency:  Continue with Coreg, hydralazine- increased Imdur dose.  Will hold Cozaar and Aldactone due to CKD stage IV for now. Started on Norvasc.  BP controlled improving.    Elevation of troponin: In the setting of heart failure exacerbation.  Likely demand ischemia. Continue Imdur Coreg. He was a started on a baby aspirin. Cardiology  consulted and following.   AKI on CKD stage IV;  Baseline cr 2.8. Peak to 3.0 Concern for cardiorenal syndrome.  S/p IV lasix, switch to Lasix 40 mg p.o. twice daily on 1/18  Nephrology consulted. Cr down to 2.8---2.37 improved.    Multifocal PVCs: Status post AICD.  Suspicious AICD discharge night of admission. Cardiology consulted and following Received IV magnesium    Hyperlipidemia: Continue with a statin   BPH: Continue with Proscar and Flomax Mild emphysematous lung disease, seen on CT scan last admission, He was Supposed to have appointment office today with pulmonologist.  DuoNebs. Dr. Keenan Bachelor recommend PRN duoneb, Low dose prednisone, azithromycin.          Estimated body mass index is 28.59 kg/m as calculated from the following:   Height as of this encounter: 5\' 11"  (1.803 m).   Weight as of this encounter: 93 kg.  Body mass index is 28.69 kg/m.  Interventions:      Diet: Heart healthy DVT Prophylaxis: Subcutaneous Lovenox   Advance goals of care discussion: Full code  Family Communication: family was not present at bedside, at the time of interview.  The pt provided permission to discuss medical plan with the family. Opportunity was given to ask question and all questions were answered satisfactorily.  Disposition:  Pt is from Home, admitted with CC exacerbation, still has respiratory distress, which precludes a safe discharge. Discharge to home, when clinically stable, may require 1 to 2 days more.  Subjective: No significant events overnight but in the morning time patient was having worsening of  shortness of breath and stated that he could not breathe, O2 saturation was above 95% on 2 L but oxygen was increased to 3 L in the morning, patient was sitting at the edge of the bed and said that now he feels little bit improvement. Patient denied any chest pain or palpitations, no abdominal pain.  Physical Exam: General:  alert oriented to time, place, and person.  Appear in mild distress, affect appropriate Eyes: PERRLA ENT: Oral Mucosa Clear, moist  Neck: no JVD,  Cardiovascular: S1 and S2 Present, no Murmur,  Respiratory: good respiratory effort, Bilateral Air entry equal and Decreased, no Crackles, no wheezes Abdomen: Bowel Sound present, Soft and no tenderness,  Skin: no rashes Extremities: no Pedal edema, no calf tenderness Neurologic: without any new focal findings Gait not checked due to patient safety concerns  Vitals:   04/30/21 0743 04/30/21 1118 04/30/21 1435 04/30/21 1530  BP: (!) 159/60 (!) 150/51 138/75 (!) 149/59  Pulse: 66 60 62 63  Resp: 20 15 16    Temp: 98.2 F (36.8 C) 98.2 F (36.8 C)    TempSrc:  Oral    SpO2: 98% 98% 96% 99%  Weight:      Height:        Intake/Output Summary (Last 24 hours) at 04/30/2021 1657 Last data filed at 04/30/2021 1300 Gross per 24 hour  Intake 240 ml  Output 1300 ml  Net -1060 ml   Filed Weights   04/27/21 1300 04/29/21 1803 04/30/21 0507  Weight: 93 kg 94.5 kg 93.3 kg    Data Reviewed: I have personally reviewed and interpreted daily labs, tele strips, imagings as discussed above. I reviewed all nursing notes, pharmacy notes, vitals, pertinent old records I have discussed plan of care as described above with RN and patient/family.  CBC: Recent Labs  Lab 04/27/21 1305 04/27/21 2246  WBC 7.0 6.3  HGB 11.4* 10.9*  HCT 35.0* 33.1*  MCV 89.7 90.9  PLT 168 270*   Basic Metabolic Panel: Recent Labs  Lab 04/27/21 1305 04/27/21 2246 04/28/21 0611 04/29/21 0611 04/30/21 0729  NA 138  --  142 141 139  K 4.0    3.9  --  3.5 3.6 3.8  CL 108  --  109 108 105  CO2 24  --  24 26 28   GLUCOSE 114*  --  87 92 87  BUN 40*  --  44* 44* 46*  CREATININE 2.83* 3.10* 3.04* 2.81* 3.67*  CALCIUM 8.2*  --  8.1* 8.1* 8.1*  MG 1.8  --   --   --   --     Studies: No results found.  Scheduled Meds:  amLODipine  10 mg Oral Daily   atorvastatin  40 mg Oral q1800   azithromycin  250 mg Oral Daily   carvedilol  6.25 mg Oral BID WC   clobetasol cream   Topical BID   clopidogrel  75 mg Oral Daily   enoxaparin (LOVENOX) injection  30 mg Subcutaneous Q24H   finasteride  5 mg Oral Daily   furosemide  40 mg Oral BID   hydrALAZINE  100 mg Oral Q8H   isosorbide mononitrate  60 mg Oral Daily   loratadine  10  mg Oral Daily   mouth rinse  15 mL Mouth Rinse BID   pantoprazole  40 mg Oral Daily   predniSONE  10 mg Oral Q breakfast   sodium chloride flush  3 mL Intravenous Q12H   tamsulosin  0.4 mg Oral Daily   cyanocobalamin  1,000 mcg Oral Daily   Continuous Infusions:  sodium chloride     PRN Meds: sodium chloride, acetaminophen, ALPRAZolam, ipratropium-albuterol, labetalol, nitroGLYCERIN, ondansetron (ZOFRAN) IV, sodium chloride flush  Time spent: 35 minutes  Author: Val Riles. MD [facility] 04/30/2021 4:57 PM  To reach On-call, see care teams to locate the attending and reach out to them via [email or web address]. If 7PM-7AM, please contact night-coverage If you still have difficulty reaching the attending provider, please page the [facility] (Director on Call) for [facility] on amion for assistance.

## 2021-04-30 NOTE — Progress Notes (Signed)
Occupational Therapy Treatment Patient Details Name: Peter [name] MRN: [id number] DOB: April 29, 1940 Today's Date: 04/30/2021   History of present illness Peter [name] is a 81 y.o. Caucasian male with medical history significant for systolic and diastolic CHF, coronary artery disease, hypertension, dyslipidemia and coronary artery disease and non-STEMI status post PCI and stent, and status post AICD, who presented to the ER with acute onset of significantly worsening dyspnea today.   OT comments  Upon entering the room, pt supine in bed on 3Ls O2 via [place]. RN arrives to room and listens to lungs reporting that pt sounds much better. Pt placed on RA during session and appears very anxious throughout about not having oxygen. Pt standing independently and requesting to don pants with supervision for safety. Pt standing at sink for grooming at mod I level and doffs dirty shirt, washes UB, and dons clean shirt without assistance. O2 remains at 96% on RA throughout. Pt returning to bed at end of session per his request. All needs within reach and RN notified pt on RA.    Recommendations for follow up therapy are one component of a multi-disciplinary discharge planning process, led by the attending physician.  Recommendations may be updated based on patient status, additional functional criteria and insurance authorization.    Follow Up Recommendations  Home health OT    Assistance Recommended at Discharge Intermittent Supervision/Assistance  Patient can return home with the following  Assistance with cooking/housework;Assist for transportation   Equipment Recommendations  BSC/3in1;Tub/shower seat       Precautions / Restrictions Precautions Precautions: Fall Restrictions Weight Bearing Restrictions: No       Mobility Bed Mobility Overal bed mobility: Modified Independent             General bed mobility comments: safe technique    Transfers Overall transfer level: Modified  independent Equipment used: None Transfers: Sit to/from Stand Sit to Stand: Modified independent (Device/Increase time)                 Balance Overall balance assessment: Needs assistance Sitting-balance support: Feet supported Sitting balance-Leahy Scale: Good       Standing balance-Leahy Scale: Good                             ADL either performed or assessed with clinical judgement   ADL Overall ADL's : Needs assistance/impaired     Grooming: Wash/dry hands;Wash/dry face;Oral care;Standing;Supervision/safety           Upper Body Dressing : Sitting;Modified independent   Lower Body Dressing: Supervision/safety;Sit to/from stand                      Extremity/Trunk Assessment Upper Extremity Assessment Upper Extremity Assessment: Generalized weakness   Lower Extremity Assessment Lower Extremity Assessment: Generalized weakness   Cervical / Trunk Assessment Cervical / Trunk Assessment: Normal    Vision Patient Visual Report: No change from baseline            Cognition Arousal/Alertness: Awake/alert Behavior During Therapy: WFL for tasks assessed/performed Overall Cognitive Status: Within Functional Limits for tasks assessed                                 General Comments: Pleasant and cooperative, some anxiety noted  Pertinent Vitals/ Pain       Pain Assessment Pain Assessment: No/denies pain         Frequency  Min 2X/week        Progress Toward Goals  OT Goals(current goals can now be found in the care plan section)  Progress towards OT goals: Progressing toward goals  Acute Rehab OT Goals Patient Stated Goal: return to independent level OT Goal Formulation: With patient/family Time For Goal Achievement: 05/12/21 Potential to Achieve Goals: Good  Plan Discharge plan remains appropriate;Frequency remains appropriate       AM-PAC OT "6 Clicks" Daily Activity     Outcome  Measure   Help from another person eating meals?: None Help from another person taking care of personal grooming?: None Help from another person toileting, which includes using toliet, bedpan, or urinal?: A Little Help from another person bathing (including washing, rinsing, drying)?: A Little Help from another person to put on and taking off regular upper body clothing?: None Help from another person to put on and taking off regular lower body clothing?: A Little 6 Click Score: 21    End of Session    OT Visit Diagnosis: Other abnormalities of gait and mobility (R26.89);Muscle weakness (generalized) (M62.81)   Activity Tolerance Patient tolerated treatment well   Patient Left in bed;with call bell/phone within reach   Nurse Communication Mobility status (Pt on RA)        Time: 8921-1941 OT Time Calculation (min): 38 min  Charges: OT General Charges $OT Visit: 1 Visit OT Treatments $Self Care/Home Management : 38-52 mins  Darleen Crocker, MS, OTR/L , CBIS ascom [phone]  04/30/21, 3:58 PM

## 2021-04-30 NOTE — Progress Notes (Signed)
[facility]  ROUNDING NOTE   Subjective:   Peter [name] is a 81 y.o. 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  Patient presents to the emergency department with complaint of worsening shortness of breath.  Patient describes a sudden onset.  Reports shortness of breath with activity and during the night.  Patient has been admitted for Elevated troponin [R77.8] Acute exacerbation of CHF (congestive heart failure) (HCC) [I50.9] Acute on chronic congestive heart failure, unspecified heart failure type [facility]) [I50.9]  Patient is known to our practice and receives outpatient care from Dr. Juleen China.   Patient seen sitting at side of bed States he feels better Remains on 3L  No peripheral edema  Objective:  Vital signs in last 24 hours:  Temp:  [97.6 F (36.4 C)-98.3 F (36.8 C)] 98.2 F (36.8 C) (01/18 1118) Pulse Rate:  [60-81] 60 (01/18 1118) Resp:  [14-20] 15 (01/18 1118) BP: (139-166)/(41-92) 150/51 (01/18 1118) SpO2:  [97 %-100 %] 98 % (01/18 1118) Weight:  [93.3 kg-94.5 kg] 93.3 kg (01/18 0507)  Weight change:  Filed Weights   04/27/21 1300 04/29/21 1803 04/30/21 0507  Weight: 93 kg 94.5 kg 93.3 kg    Intake/Output: I/O last 3 completed shifts: In: -  Out: 2500 [Urine:2500]   Intake/Output this shift:  Total I/O In: -  Out: 350 [Urine:350]  Physical Exam: General: NAD, resting on stretcher  Head: Normocephalic, atraumatic. Moist oral mucosal membranes  Eyes: Anicteric  Lungs:  Basilar wheeze, normal effort, 3 L [place]  Heart: Regular rate and rhythm  Abdomen:  Soft, nontender  Extremities: no peripheral edema.  Neurologic: Nonfocal, moving all four extremities  Skin: No lesions       Basic Metabolic Panel: Recent Labs  Lab 04/27/21 1305 04/27/21 2246 04/28/21 0611 04/29/21 0611 04/30/21 0729   NA 138  --  142 141 139  K 4.0   3.9  --  3.5 3.6 3.8  CL 108  --  109 108 105  CO2 24  --  24 26 28   GLUCOSE 114*  --  87 92 87  BUN 40*  --  44* 44* 46*  CREATININE 2.83* 3.10* 3.04* 2.81* 3.67*  CALCIUM 8.2*  --  8.1* 8.1* 8.1*  MG 1.8  --   --   --   --      Liver Function Tests: No results for input(s): AST, ALT, ALKPHOS, BILITOT, PROT, ALBUMIN in the last 168 hours. No results for input(s): LIPASE, AMYLASE in the last 168 hours. No results for input(s): AMMONIA in the last 168 hours.  CBC: Recent Labs  Lab 04/27/21 1305 04/27/21 2246  WBC 7.0 6.3  HGB 11.4* 10.9*  HCT 35.0* 33.1*  MCV 89.7 90.9  PLT 168 144*     Cardiac Enzymes: No results for input(s): CKTOTAL, CKMB, CKMBINDEX, TROPONINI in the last 168 hours.  BNP: Invalid input(s): POCBNP  CBG: No results for input(s): GLUCAP in the last 168 hours.  Microbiology: Results for orders placed or performed during the hospital encounter of 04/27/21  Resp Panel by RT-PCR (Flu A&B, Covid) Nasopharyngeal Swab     Status: None   Collection Time: 04/27/21  6:30 PM   Specimen: Nasopharyngeal Swab; Nasopharyngeal(NP) swabs in vial transport medium  Result Value Ref Range Status   SARS Coronavirus 2 by RT PCR NEGATIVE NEGATIVE Final    Comment: (  NOTE) SARS-CoV-2 target nucleic acids are NOT DETECTED.  The SARS-CoV-2 RNA is generally detectable in upper respiratory specimens during the acute phase of infection. The lowest concentration of SARS-CoV-2 viral copies this assay can detect is 138 copies/mL. A negative result does not preclude SARS-Cov-2 infection and should not be used as the sole basis for treatment or other patient management decisions. A negative result may occur with  improper specimen collection/handling, submission of specimen other than nasopharyngeal swab, presence of viral mutation(s) within the areas targeted by this assay, and inadequate number of viral copies(<138 copies/mL). A negative  result must be combined with clinical observations, patient history, and epidemiological information. The expected result is Negative.  Fact Sheet for Patients:  EntrepreneurPulse.com.au  Fact Sheet for Healthcare Providers:  IncredibleEmployment.be  This test is no t yet approved or cleared by the [place] FDA and  has been authorized for detection and/or diagnosis of SARS-CoV-2 by FDA under an Emergency Use Authorization (EUA). This EUA will remain  in effect (meaning this test can be used) for the duration of the COVID-19 declaration under Section 564(b)(1) of the Act, 21 U.S.C.section 360bbb-3(b)(1), unless the authorization is terminated  or revoked sooner.       Influenza A by PCR NEGATIVE NEGATIVE Final   Influenza B by PCR NEGATIVE NEGATIVE Final    Comment: (NOTE) The Xpert Xpress SARS-CoV-2/FLU/RSV plus assay is intended as an aid in the diagnosis of influenza from Nasopharyngeal swab specimens and should not be used as a sole basis for treatment. Nasal washings and aspirates are unacceptable for Xpert Xpress SARS-CoV-2/FLU/RSV testing.  Fact Sheet for Patients: EntrepreneurPulse.com.au  Fact Sheet for Healthcare Providers: IncredibleEmployment.be  This test is not yet approved or cleared by the [place] FDA and has been authorized for detection and/or diagnosis of SARS-CoV-2 by FDA under an Emergency Use Authorization (EUA). This EUA will remain in effect (meaning this test can be used) for the duration of the COVID-19 declaration under Section 564(b)(1) of the Act, 21 U.S.C. section 360bbb-3(b)(1), unless the authorization is terminated or revoked.  Performed at [facility], [address] [place], [place] [address]     Coagulation Studies: No results for input(s): LABPROT, INR in the last 72 hours.  Urinalysis: No results for input(s): COLORURINE, LABSPEC,  PHURINE, GLUCOSEU, HGBUR, BILIRUBINUR, KETONESUR, PROTEINUR, UROBILINOGEN, NITRITE, LEUKOCYTESUR in the last 72 hours.  Invalid input(s): APPERANCEUR    Imaging: No results found.   Medications:    sodium chloride      amLODipine  10 mg Oral Daily   atorvastatin  40 mg Oral q1800   azithromycin  250 mg Oral Daily   carvedilol  6.25 mg Oral BID WC   clobetasol cream   Topical BID   clopidogrel  75 mg Oral Daily   enoxaparin (LOVENOX) injection  30 mg Subcutaneous Q24H   finasteride  5 mg Oral Daily   furosemide  40 mg Oral BID   hydrALAZINE  100 mg Oral Q8H   isosorbide mononitrate  60 mg Oral Daily   loratadine  10 mg Oral Daily   mouth rinse  15 mL Mouth Rinse BID   pantoprazole  40 mg Oral Daily   potassium chloride  40 mEq Oral Once   predniSONE  10 mg Oral Q breakfast   sodium chloride flush  3 mL Intravenous Q12H   tamsulosin  0.4 mg Oral Daily   cyanocobalamin  1,000 mcg Oral Daily   sodium chloride, acetaminophen, ALPRAZolam, ipratropium-albuterol, labetalol,  nitroGLYCERIN, ondansetron (ZOFRAN) IV, sodium chloride flush  Assessment/ Plan:  Peter [name] is a 81 y.o.  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  Patient presents to the emergency department with complaint of worsening shortness of breath.  Patient describes a sudden onset.  Reports shortness of breath with activity and during the night.  Patient has been admitted for Elevated troponin [R77.8] Acute exacerbation of CHF (congestive heart failure) ([place]) [I50.9] Acute on chronic congestive heart failure, unspecified heart failure type ([place]) [I50.9]   Acute Kidney Injury on chronic kidney disease stage IV with baseline creatinine 2.8 and GFR of 22 on 04/27/21.  Acute kidney injury possibly secondary to cardiorenal syndrome Chronic kidney disease is  secondary to multiple factors including hypertension and extensive cardiac history. Losartan held No indication for dialysis at this time. No IV contrast exposure Creatinine elevated today, Will transition to oral Furosemide 40mg  twice daily. Adequate urine output of 1.7L recorded in past 24 hours.  Lab Results  Component Value Date   CREATININE 3.67 (H) 04/30/2021   CREATININE 2.81 (H) 04/29/2021   CREATININE 3.04 (H) 04/28/2021    Intake/Output Summary (Last 24 hours) at 04/30/2021 1137 Last data filed at 04/30/2021 1100 Gross per 24 hour  Intake --  Output 2050 ml  Net -2050 ml    2.  Hypotension with chronic kidney disease.  Home regimen includes carvedilol, furosemide, hydralazine, isosorbide, losartan, and spironolactone.  Currently also receiving amlodipine, carvedilol, and isosorbide. BP 150/51  3.  Combined systolic and diastolic heart failure.  Followed by Dr. Georgia Dom.  Echo completed on 04/04/2021 indicates EF of 50 to 55% with mild LVH and grade 2 diastolic dysfunction.  With right ventricular enlargement.    LOS: 3 [place] 1/18/202311:37 AM

## 2021-05-01 ENCOUNTER — Inpatient Hospital Stay: Payer: PPO

## 2021-05-01 LAB — BASIC METABOLIC PANEL
Anion gap: 6 (ref 5–15)
BUN: 52 mg/dL — ABNORMAL HIGH (ref 8–23)
CO2: 27 mmol/L (ref 22–32)
Calcium: 7.6 mg/dL — ABNORMAL LOW (ref 8.9–10.3)
Chloride: 101 mmol/L (ref 98–111)
Creatinine, Ser: 3.67 mg/dL — ABNORMAL HIGH (ref 0.61–1.24)
GFR, Estimated: 16 mL/min — ABNORMAL LOW (ref >60)
Glucose, Bld: 120 mg/dL — ABNORMAL HIGH (ref 70–99)
Potassium: 3.3 mmol/L — ABNORMAL LOW (ref 3.5–5.1)
Sodium: 134 mmol/L — ABNORMAL LOW (ref 135–145)

## 2021-05-01 MED ORDER — FUROSEMIDE 40 MG PO TABS
40.0000 mg | ORAL_TABLET | Freq: Every day | ORAL | Status: DC
  Administered 2021-05-02: 40 mg via ORAL
  Filled 2021-05-01: qty 1

## 2021-05-01 MED ORDER — VITAMIN D (ERGOCALCIFEROL) 1.25 MG (50000 UNIT) PO CAPS
50000.0000 [IU] | ORAL_CAPSULE | ORAL | Status: DC
  Administered 2021-05-01: 50000 [IU] via ORAL
  Filled 2021-05-01: qty 1

## 2021-05-01 MED ORDER — TECHNETIUM TO 99M ALBUMIN AGGREGATED
4.1100 | Freq: Once | INTRAVENOUS | Status: AC | PRN
  Administered 2021-05-01: 4.11 via INTRAVENOUS

## 2021-05-01 MED ORDER — GUAIFENESIN ER 600 MG PO TB12
600.0000 mg | ORAL_TABLET | Freq: Two times a day (BID) | ORAL | Status: DC
  Administered 2021-05-01 – 2021-05-02 (×3): 600 mg via ORAL
  Filled 2021-05-01 (×3): qty 1

## 2021-05-01 NOTE — Plan of Care (Signed)
  Problem: Education: Goal: Knowledge of General Education information will improve Description Including pain rating scale, medication(s)/side effects and non-pharmacologic comfort measures Outcome: Progressing   

## 2021-05-01 NOTE — Progress Notes (Signed)
CARDIOLOGY CONSULT NOTE    Patient ID: Peter [name] MRN: [id number] DOB/AGE: 12-06-1940 81 y.o.  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  Cardiology is consulted for management of his heart failure.  Interval history: -Patient denies chest pain, shortness of breath. -Walked with physical therapy around the floor yesterday.  He wants to make sure he goes home with supplemental oxygen. -0.470L overnight with PO lasix, Cr and GFR stable from yesterday at 3.67 and 16   Review of systems complete and found to be negative unless listed above    Past Medical History:  Diagnosis Date   Anginal pain ([place])    Asthma    Bladder infection, acute 03/2011   "had a whole lot of bleeding from this"   CHF (congestive heart failure) ([place])    Coronary artery disease    High cholesterol    Hypertension    Macular degeneration 12/14/2011   "had it in my right; getting shots now in my left"   Myocardial infarction [facility]) 1996   NSTEMI (non-ST elevated myocardial infarction) ([place]) 12/14/2011   Shortness of breath 12/14/2011   "@ rest, lying down, w/exertion"    Past Surgical History:  Procedure Laterality Date   [place]; ~ 2003; ~ 2008   "total of 3"   CORONARY STENT INTERVENTION N/A 08/10/2016   Procedure: Coronary Stent Intervention;  Surgeon: [name], MD;  Location: [facility];  Service: Cardiovascular;  Laterality: N/A;   HERNIA REPAIR  ~ 2001   "abdominal w/mesh  implanted"   LEFT HEART CATH AND CORONARY ANGIOGRAPHY N/A 08/10/2016   Procedure: Left Heart Cath and Coronary Angiography;  Surgeon: [name], MD;  Location: [facility];  Service: Cardiovascular;  Laterality: N/A;   LEFT HEART CATHETERIZATION WITH CORONARY ANGIOGRAM N/A 12/16/2011   Procedure: LEFT HEART CATHETERIZATION WITH CORONARY ANGIOGRAM;  Surgeon: Minus Breeding, MD;  Location: [facility] CATH LAB;  Service: Cardiovascular;  Laterality: N/A;   PERCUTANEOUS CORONARY STENT INTERVENTION (PCI-S) N/A 12/17/2011   Procedure: PERCUTANEOUS CORONARY STENT INTERVENTION (PCI-S);  Surgeon: Sherren Mocha, MD;  Location: [facility] CATH LAB;  Service: Cardiovascular;  Laterality: N/A;   TONSILLECTOMY     "I was a kid"    Medications Prior to Admission  Medication Sig Dispense Refill Last Dose   atorvastatin (LIPITOR) 40 MG tablet Take 1 tablet (40 mg total) by mouth daily at 6 PM. 30 tablet 0 04/26/2021 at 2000   carvedilol (COREG) 6.25 MG tablet Take 6.25 mg by mouth 2 (two) times daily with a meal.   04/27/2021 at 0800   cetirizine (ZYRTEC) 10 MG tablet Take 10 mg by mouth.      clobetasol cream (TEMOVATE) 8.25 % Apply 1 application topically 2 (two) times daily as needed (psoriasis).  1 Unknown at PRN   clopidogrel (PLAVIX) 75 MG tablet TAKE 1 TABLET(75 MG) BY MOUTH EVERY DAY 30 tablet 1 04/27/2021 at 0800   cyanocobalamin 1000 MCG tablet Take 1,000 mcg by mouth daily.       finasteride (PROSCAR) 5 MG tablet Take 1 tablet (5 mg  total) by mouth daily. 90 tablet 3 04/28/2021 at 0800   furosemide (LASIX) 20 MG tablet Hold this medication until followup with nephrology or cardiology. (Patient taking differently: Take 20 mg by mouth daily.) 30 tablet  04/28/2021 at 0800   GLUCOSAMINE-CHONDROITIN-VIT C PO Take 1 tablet by mouth daily.      hydrALAZINE (APRESOLINE) 100 MG tablet Take 1 tablet (100 mg total) by mouth every 8 (eight) hours. (Patient taking differently: Take 100 mg by mouth every 12 (twelve)  hours.) 270 tablet 0 04/27/2021 at 0800   isosorbide mononitrate (IMDUR) 30 MG 24 hr tablet Take 30 mg by mouth daily.   04/27/2021 at 0800   omeprazole (PRILOSEC) 20 MG capsule Take 20 mg by mouth in the morning.   04/27/2021 at 0800   tamsulosin (FLOMAX) 0.4 MG CAPS capsule Take 1 capsule (0.4 mg total) by mouth daily. 90 capsule 3 04/27/2021 at 0800   calcitRIOL (ROCALTROL) 0.25 MCG capsule Take 0.25 mcg by mouth daily.      losartan (COZAAR) 100 MG tablet Hold until followup with your outpatient doctor due to worsening kidney function.      spironolactone (ALDACTONE) 25 MG tablet Hold until followup with your outpatient doctor due to worsening kidney function.       Social History   Socioeconomic History   Marital status: Widowed    Spouse name: Not on file   Number of children: Not on file   Years of education: Not on file   Highest education level: Not on file  Occupational History   Not on file  Tobacco Use   Smoking status: Former    Packs/day: 0.50    Years: 0.50    Pack years: 0.25    Types: Cigarettes   Smokeless tobacco: Never   Tobacco comments:    11/23/16 Still not ready to quit smoking.  Substance and Sexual Activity   Alcohol use: Yes    Alcohol/week: 1.0 standard drink    Types: 1 Cans of beer per week    Comment: 12/14/2011 "if my kidneys are sore, I drink 1 beer/day; if not sore; no beer; occasionally drink socially; ave 1 beer/wk maybe"   Drug use: No   Sexual activity: Not Currently  Other Topics Concern   Not on file  Social History Narrative   Not on file   Social Determinants of Health   Financial Resource Strain: Not on file  Food Insecurity: Not on file  Transportation Needs: Not on file  Physical Activity: Not on file  Stress: Not on file  Social Connections: Not on file  Intimate Partner Violence: Not on file    Family History  Family history unknown: Yes      Review of systems complete and found to be negative unless listed above     PHYSICAL EXAM General: Elderly caucasian male, well nourished, in no acute distress. Sitting upright with legs off of bed eating breakfast.  HEENT:  Normocephalic and atraumatic. Neck:  No JVD.  Lungs: Normal respiratory effort on 2L by Akins. Clear bilaterally to auscultation.  No wheezes crackles, rhonchi.  Heart: HRRR . Normal S1 and S2 without gallops or murmurs.  Abdomen: Non-distended appearing.  Msk: Normal strength and tone for age. Extremities: No clubbing, cyanosis or edema.   Neuro: Alert and oriented X 3. Psych:  Mood appropriate, affect congruent.   Labs:   Lab Results  Component Value Date   WBC 6.3 04/27/2021   HGB 10.9 (L) 04/27/2021   HCT  33.1 (L) 04/27/2021   MCV 90.9 04/27/2021   PLT 144 (L) 04/27/2021    Recent Labs  Lab 05/01/21 0708  NA 134*  K 3.3*  CL 101  CO2 27  BUN 52*  CREATININE 3.67*  CALCIUM 7.6*  GLUCOSE 120*    Lab Results  Component Value Date   CKTOTAL 23 (L) 12/11/2013   CKMB 1.0 12/11/2013   TROPONINI 0.05 (HH) 12/21/2017     Lab Results  Component Value Date   CHOL 108 08/08/2016   CHOL 141 12/16/2011   Lab Results  Component Value Date   HDL 36 (L) 08/08/2016   HDL 38 (L) 12/16/2011   Lab Results  Component Value Date   LDLCALC 50 08/08/2016   LDLCALC 83 12/16/2011   Lab Results  Component Value Date   TRIG 111 08/08/2016   TRIG 102 12/16/2011   Lab Results  Component Value Date   CHOLHDL 3.0 08/08/2016   CHOLHDL 3.7 12/16/2011   No results found for: LDLDIRECT    Radiology: CT ABDOMEN PELVIS WO CONTRAST  Result Date: 04/04/2021 CLINICAL DATA:  Retroperitoneal hemorrhage suspected. EXAM: CT ABDOMEN AND PELVIS WITHOUT CONTRAST TECHNIQUE: Multidetector CT imaging of the abdomen and pelvis was performed following the standard protocol without IV contrast. COMPARISON:  01/29/2016 FINDINGS: Lower chest: Bibasilar atelectasis noted with tiny bilateral pleural effusions. Hepatobiliary: No focal abnormality in  the liver on this study without intravenous contrast. Layering tiny gallstones evident. No intrahepatic or extrahepatic biliary dilation. Pancreas: No focal mass lesion. No dilatation of the main duct. No intraparenchymal cyst. No peripancreatic edema. Spleen: No splenomegaly. No focal mass lesion. Adrenals/Urinary Tract: No adrenal nodule or mass. Small cyst noted upper pole left kidney. No evidence for hydroureter. The urinary bladder appears normal for the degree of distention. Stomach/Bowel: Stomach is unremarkable. No gastric wall thickening. No evidence of outlet obstruction. Duodenum is normally positioned as is the ligament of Treitz. No small bowel wall thickening. No small bowel dilatation. The terminal ileum is normal. The appendix is normal. No gross colonic mass. No colonic wall thickening. Diverticular changes are noted in the left colon without evidence of diverticulitis. 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 Low-attenuation left para-aortic lymph nodes (image 32/2) are stable since 2017 consistent with benign etiology. No pelvic sidewall lymphadenopathy. Reproductive: The prostate gland and seminal vesicles are unremarkable. Other: No intraperitoneal free fluid. Musculoskeletal: Multiple supraumbilical ventral hernias identified, containing only fat. Stable bone island left anterior pubic ramus. No worrisome lytic or sclerotic osseous abnormality. IMPRESSION: 1. No acute findings in the abdomen or pelvis. Specifically, no findings to suggests retroperitoneal hemorrhage. 2. 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 3. No change in the left para-aortic lymphadenopathy consistent with benign/reactive etiology. 4. Cholelithiasis. 5. Multiple supraumbilical ventral hernias containing only fat. Electronically Signed   By: Misty Stanley M.D.    On: 04/04/2021 07:36   DG Chest 2 View  Result Date: 04/27/2021 CLINICAL DATA:  Chest pain. EXAM: CHEST - 2 VIEW COMPARISON:  None. FINDINGS: Stable position of cardiac pacemaker.  Aortic stent graft noted. Tortuosity and calcific atherosclerotic disease of the aorta. Cardiomediastinal silhouette is normal. Mediastinal contours appear intact. There is no evidence of focal airspace consolidation, pleural effusion or pneumothorax. Probable bilateral small pleural effusions. Osseous structures are without acute abnormality. Soft tissues are grossly normal. IMPRESSION: Probable bilateral small pleural effusions. Electronically  Signed   By: Fidela Salisbury M.D.   On: 04/27/2021 13:45   DG Chest 2 View  Result Date: 04/11/2021 CLINICAL DATA:  Shortness of breath EXAM: CHEST - 2 VIEW COMPARISON:  04/03/2021 FINDINGS: No focal consolidation. No pleural effusion or pneumothorax. Heart and mediastinal contours are unremarkable. Dual lead cardiac pacemaker. No acute osseous abnormality. IMPRESSION: No active cardiopulmonary disease. Electronically Signed   By: Kathreen Devoid M.D.   On: 04/11/2021 15:11   DG Chest 2 View  Result Date: 04/03/2021 CLINICAL DATA:  sob EXAM: CHEST - 2 VIEW COMPARISON:  Chest x-ray 09/07/2016, CT chest 12/14/2011. FINDINGS: The heart and mediastinal contours are unchanged. Coronary artery stent. Aortic calcification. The distal descending thoracic aorta/suprarenal abdominal aorta stent noted on lateral view. Three lead cardiac pacemaker/defibrillator overlies the left chest wall. No focal consolidation. No pulmonary edema. Blunting of bilateral costophrenic angles. No pneumothorax. No acute osseous abnormality. IMPRESSION: Blunting of bilateral costophrenic angles. Trace pleural effusions not excluded. Electronically Signed   By: Iven Finn M.D.   On: 04/03/2021 22:56   CT CHEST WO CONTRAST  Result Date: 04/12/2021 CLINICAL DATA:  Shortness of breath. EXAM: CT CHEST WITHOUT  CONTRAST TECHNIQUE: Multidetector CT imaging of the chest was performed following the standard protocol without IV contrast. COMPARISON:  December 14, 2011 FINDINGS: Cardiovascular: A dual lead AICD is in place. There is moderate to marked severity calcification of the aortic arch and descending thoracic aorta. 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 A stent is noted within the distal aspect of the descending thoracic aorta. This extends to include the visualized portion of the abdominal aorta. Normal heart size with coronary artery stents in place. No pericardial effusion. Mediastinum/Nodes: There is mild AP window and pretracheal lymphadenopathy. Thyroid gland, trachea, and esophagus demonstrate no significant findings. Lungs/Pleura: There is mild emphysematous lung disease involving the bilateral upper lobes. Mild areas of linear scarring and/or atelectasis are noted within the posterior aspects of the bilateral lung bases. There are very small bilateral pleural effusions. No pneumothorax is identified. Upper Abdomen: A thin layer of tiny gallstones is seen within the dependent portion of the gallbladder. Vascular stents are seen within the origins of the celiac artery, superior mesenteric artery and bilateral renal arteries. Musculoskeletal: Multilevel degenerative changes seen throughout the thoracic spine. IMPRESSION: 1. Mild emphysematous lung disease. 2. Mild bibasilar linear scarring and/or atelectasis. 3. Very small bilateral pleural effusions. 4. Extensive stenting of the distal descending thoracic aorta, as well as the abdominal aorta and several of its branches. 5. Cholelithiasis. Aortic Atherosclerosis (ICD10-I70.0) and Emphysema (ICD10-J43.9). Electronically Signed   By: Virgina Norfolk M.D.   On: 04/12/2021 04:11   NM Pulmonary Perfusion  Result Date: 04/12/2021 CLINICAL DATA:  Evaluate for pulmonary embolus. Shortness of breath. EXAM: NUCLEAR MEDICINE PERFUSION LUNG SCAN  TECHNIQUE: Perfusion images were obtained in multiple projections after intravenous injection of radiopharmaceutical. Ventilation scans intentionally deferred if perfusion scan and chest x-ray adequate for interpretation during COVID 19 epidemic. RADIOPHARMACEUTICALS:  4.3 mCi Tc-68m MAA IV COMPARISON:  Chest radiograph 04/11/21 FINDINGS: On the chest radiograph from 04/11/2021 there is a ICD overlying the left chest wall. No focal pulmonary opacities identified. On the perfusion portion of the examination there is a focal peripheral defect overlying the left upper lobe corresponding to the ICD battery pack. Mild heterogeneous distribution of the radiopharmaceutical is noted within both lungs. On the lateral projection images there are large nonsegmental areas of diminished activity within both posterior  and upper lung zones which likely represents non-uniform soft tissue attenuation artifact secondary to patient's upper extremities. No peripheral segmental perfusion defects to suggest acute pulmonary embolus. IMPRESSION: 1. No evidence for acute pulmonary embolus. Electronically Signed   By: Kerby Moors M.D.   On: 04/12/2021 11:01   US RENAL  Result Date: 04/08/2021 CLINICAL DATA:  Renal dysfunction. EXAM: RENAL / URINARY TRACT ULTRASOUND COMPLETE COMPARISON:  Abdominal sonogram done on 06/20/2015 FINDINGS: Right Kidney: Renal measurements: 10.9 x 5.3 x 5 cm = volume: 152 mL. There is no hydronephrosis. There is increased cortical echogenicity. Left Kidney: Renal measurements: 11.2 x 5.5 x 4.5 cm = volume: 147 mL. There is no hydronephrosis. There is increased cortical echogenicity. There is 2 cm cyst in the upper pole. Bladder: Appears normal for degree of bladder distention. Other: None. IMPRESSION: There is no hydronephrosis. Increased cortical echogenicity suggests medical renal disease. 2 cm left renal cyst. Electronically Signed   By: Elmer Picker M.D.   On: 04/08/2021 16:11   US Venous Img  Lower Bilateral  Result Date: 04/04/2021 CLINICAL DATA:  Bilateral lower extremity edema, dyspnea EXAM: BILATERAL LOWER EXTREMITY VENOUS DOPPLER ULTRASOUND TECHNIQUE: Gray-scale sonography with compression, as well as color and duplex ultrasound, were performed to evaluate the deep venous system(s) from the level of the common femoral vein through the popliteal and proximal calf veins. COMPARISON:  None. FINDINGS: VENOUS Normal compressibility of the common femoral, superficial femoral, and popliteal veins, as well as the visualized calf veins. Visualized portions of profunda femoral vein and great saphenous vein unremarkable. No filling defects to suggest DVT on grayscale or color Doppler imaging. Doppler waveforms show normal direction of venous flow, normal respiratory plasticity and response to augmentation. OTHER None. Limitations: none IMPRESSION: Negative. Electronically Signed   By: Fidela Salisbury M.D.   On: 04/04/2021 03:32   US Venous Img Upper Uni Left  Result Date: 04/11/2021 CLINICAL DATA:  Left upper extremity pain and swelling. EXAM: LEFT UPPER EXTREMITY VENOUS DOPPLER ULTRASOUND TECHNIQUE: Gray-scale sonography with graded compression, as well as color Doppler and duplex ultrasound were performed to evaluate the upper extremity deep venous system from the level of the subclavian vein and including the jugular, axillary, basilic, radial, ulnar and upper cephalic vein. Spectral Doppler was utilized to evaluate flow at rest and with distal augmentation maneuvers. COMPARISON:  None. FINDINGS: Contralateral Subclavian Vein: Respiratory phasicity is normal and symmetric with the symptomatic side. No evidence of thrombus. Normal compressibility. Internal Jugular Vein: No evidence of thrombus. Normal compressibility, respiratory phasicity and response to augmentation. Subclavian Vein: No evidence of thrombus. Normal compressibility, respiratory phasicity and response to augmentation. Axillary Vein:  No evidence of thrombus. Normal compressibility, respiratory phasicity and response to augmentation. Cephalic Vein: No evidence of thrombus. Normal compressibility, respiratory phasicity and response to augmentation. Basilic Vein: No evidence of thrombus. Normal compressibility, respiratory phasicity and response to augmentation. Brachial Veins: No evidence of thrombus. Normal compressibility, respiratory phasicity and response to augmentation. Radial Veins: No evidence of thrombus. Normal compressibility, respiratory phasicity and response to augmentation. Ulnar Veins: No evidence of thrombus. Normal compressibility, respiratory phasicity and response to augmentation. Venous Reflux:  None visualized. Other Findings:  None visualized. IMPRESSION: No evidence of DVT within the LEFT upper extremity. Electronically Signed   By: Virgina Norfolk M.D.   On: 04/11/2021 20:25   ECHOCARDIOGRAM COMPLETE  Result Date: 04/06/2021    ECHOCARDIOGRAM REPORT   Patient Name:   Peter [name] Date of Exam: 04/04/2021 Medical Rec #:  [id number]        Height:       70.0 in Accession #:    [id number]       Weight:       220.0 lb Date of Birth:  04-25-1940       BSA:          2.174 m Patient Age:    46 years         BP:           198/65 mmHg Patient Gender: M                HR:           89 bpm. Exam Location:  [facility] Procedure: 2D Echo, Cardiac Doppler and Color Doppler Indications:     Elevated Troponin  History:         Patient has prior history of Echocardiogram examinations, most                  recent 08/08/2016. CHF, Previous Myocardial Infarction; Risk                  Factors:Hypertension.  Sonographer:     Sherrie Sport Referring Phys:  [id number] AGBATA Diagnosing Phys: Yolonda Kida MD  Sonographer Comments: Suboptimal parasternal window. IMPRESSIONS  1. Left ventricular ejection fraction, by estimation, is 50 to 55%. The left ventricle has low normal function. The left ventricle has no regional wall motion  abnormalities. The left ventricular internal cavity size was mildly dilated. There is mild left ventricular hypertrophy. Left ventricular diastolic parameters are consistent with Grade II diastolic dysfunction (pseudonormalization).  2. Right ventricular systolic function is low normal. The right ventricular size is moderately enlarged.  3. The mitral valve is normal in structure. Mild mitral valve regurgitation.  4. The aortic valve is normal in structure. Aortic valve regurgitation is mild. FINDINGS  Left Ventricle: Left ventricular ejection fraction, by estimation, is 50 to 55%. The left ventricle has low normal function. The left ventricle has no regional wall motion abnormalities. The left ventricular internal cavity size was mildly dilated. There is mild left ventricular hypertrophy. Left ventricular diastolic parameters are consistent with Grade II diastolic dysfunction (pseudonormalization). Right Ventricle: The right ventricular size is moderately enlarged. No increase in right ventricular wall thickness. Right ventricular systolic function is low normal. Left Atrium: Left atrial size was normal in size. Right Atrium: Right atrial size was normal in size. Pericardium: There is no evidence of pericardial effusion. Mitral Valve: The mitral valve is normal in structure. Mild mitral valve regurgitation. Tricuspid Valve: The tricuspid valve is normal in structure. Tricuspid valve regurgitation is mild. Aortic Valve: The aortic valve is normal in structure. Aortic valve regurgitation is mild. Aortic valve mean gradient measures 4.0 mmHg. Aortic valve peak gradient measures 6.7 mmHg. Aortic valve area, by VTI measures 3.09 cm. Pulmonic Valve: The pulmonic valve was normal in structure. Pulmonic valve regurgitation is not visualized. Aorta: The ascending aorta was not well visualized. IAS/Shunts: No atrial level shunt detected by color flow Doppler. Additional Comments: A device lead is visualized. There is no  pleural effusion.  LEFT VENTRICLE PLAX 2D LVIDd:         4.90 cm   Diastology LVIDs:         3.30 cm   LV e' medial:    7.07 cm/s LV PW:         1.50 cm   LV E/e' medial:  14.0 LV  IVS:        1.05 cm   LV e' lateral:   8.27 cm/s LVOT diam:     2.10 cm   LV E/e' lateral: 12.0 LV SV:         86 LV SV Index:   40 LVOT Area:     3.46 cm  RIGHT VENTRICLE RV Basal diam:  4.40 cm RV S prime:     14.40 cm/s TAPSE (M-mode): 4.7 cm LEFT ATRIUM           Index        RIGHT ATRIUM           Index LA diam:      2.90 cm 1.33 cm/m   RA Area:     27.00 cm LA Vol (A2C): 45.6 ml 20.98 ml/m  RA Volume:   85.90 ml  39.52 ml/m LA Vol (A4C): 56.1 ml 25.81 ml/m  AORTIC VALVE                    PULMONIC VALVE AV Area (Vmax):    3.01 cm     PV Vmax:        0.70 m/s AV Area (Vmean):   3.08 cm     PV Vmean:       44.100 cm/s AV Area (VTI):     3.09 cm     PV VTI:         0.107 m AV Vmax:           129.00 cm/s  PV Peak grad:   1.9 mmHg AV Vmean:          88.400 cm/s  PV Mean grad:   1.0 mmHg AV VTI:            0.279 m      RVOT Peak grad: 3 mmHg AV Peak Grad:      6.7 mmHg AV Mean Grad:      4.0 mmHg LVOT Vmax:         112.00 cm/s LVOT Vmean:        78.700 cm/s LVOT VTI:          0.249 m LVOT/AV VTI ratio: 0.89  AORTA Ao Root diam: 3.40 cm MITRAL VALVE               TRICUSPID VALVE MV Area (PHT): 4.83 cm    TR Peak grad:   40.4 mmHg MV Decel Time: 157 msec    TR Vmax:        318.00 cm/s MV E velocity: 99.00 cm/s MV A velocity: 87.00 cm/s  SHUNTS MV E/A ratio:  1.14        Systemic VTI:  0.25 m                            Systemic Diam: 2.10 cm                            Pulmonic VTI:  0.149 m Yolonda Kida MD Electronically signed by Yolonda Kida MD Signature Date/Time: 04/06/2021/11:32:38 AM    Final     ECHO 04/04/2021 LVEF 50-55% without wall motion abnormalities, mild LV dilation, mild LVH, grade 2 diastolic dysfunction, mild MR, mild AR  TELEMETRY reviewed by me: NSR LBBB PVCs rate 80   EKG reviewed by me: Normal  sinus rhythm, LBBB, multiform PVCs rate 78  ASSESSMENT AND PLAN:  The patient is an 81 year old male with a past medical history significant for HFpEF (LVEF 50-55%) 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  Cardiology is consulted for management of his heart failure.  #Acute on chronic HFpEF (LVEF 50-55%) / ICM s/p ICD placement 09/2017 #acute exacerbation of COPD #PVCs #Elevated troponin d/t demand ischemia #AKI on CKD 4 The patient was recently hospitalized from 12/22 - 12/28 and finished his rehab stay on the day of admission 1/15. He was not started back on his lasix at discharge due to an AKI and presented to the hospital slightly volume overloaded and SOB. The pt states he feels like not being on supplemental oxygen is what is making him SOB. There is a likely a dietary indiscretion component to his current presentation. Additionally, the pt's blood pressure was severely elevated on admission and troponins are elevated too likely as a result.  -appreciate nephrology recommendations, will hold spironolactone and ARB.  -Holding lasix 40mg  PO BID. patient appears euvolemic. - Cr stable from yesterday 3.67 and GFR down to 16 (baseline at 2.81 and 22) -agree with pulmonology who recommends duonebs, prednisone, and azithromycin for COPD exacerbation -He is okay for discharge home from a cardiac standpoint when appropriate by primary team and nephrology.  No further cardiac diagnostics necessary.  He will need follow-up with his regular cardiologist Dr. Clayborn Bigness 1 to 2 weeks after discharge.  #Hypertensive urgency, improved Home medication regimen includes amlodipine 10mg , carvedilol 6.25 mg twice daily, imudr 60mg  and hydralazine 100 mg every 8 hours  #Dyslipidemia Continue high intensity  statin Lipitor 40 mg once daily  Cardiology signing off.  Please contact myself or the on-call physician Dr. Nehemiah Massed with further questions throughout the week.  This patient's plan of care was discussed and created with Dr. Serafina Royals and he is in agreement.  Signed: Tristan Schroeder , PA-C 05/01/2021, 8:23 AM

## 2021-05-01 NOTE — Progress Notes (Signed)
[facility] Progress Note  Patient: Peter [name]    RKY:[id number]  DOA: 04/27/2021     Date of Service: the patient was seen and examined on 05/01/2021  Chief Complaint  Patient presents with   Shortness of Breath   Brief hospital course: 81 year old with past medical history significant for systolic and diastolic heart failure, CAD, hypertension, dyslipidemia, non-STEMI status post PCI on steroids, status post AICD who presents to the ED with acute onset of worsening shortness of breath.  He describes orthopnea, paroxysmal nocturnal dyspnea, exertional dyspnea.  Recent admission 12/22 until 12/28 for symptomatic anemia, received blood transfusion, treatment for heart failure exacerbation with diuresis.  His Lasix was discontinued at discharge due to AKI on CKD stage IV.  He has not been taking Lasix since her last hospitalization.  AICD was interrogated by ER physician and show increased PVCs.   Evaluation in the ED patient was noted to be tensive systolic blood pressure 315, tachypneic respiration rate 25, BUN 40, creatinine 2.8 prior creatinine 3.2 on 9/9.  Last admission creatinine at discharge was 3.1.  BNP elevated 2988.  Troponin mildly elevated to 253--- 259. Chest x-ray showed bilateral small pleural effusion.   Patient admitted for further evaluation and treatment of heart failure exacerbation.     Assessment and Plan: Principal Problem:   Acute exacerbation of CHF (congestive heart failure) (HCC)   Acute on chronic diastolic heart failure exacerbation: His systolic heart failure has resolved since 2D echo 04/04/2021.  Last ejection fraction 50 to 55%. -s/p IV Lasix, switch to Lasix 40 mg p.o. twice daily as per cardiology, discussed with nephrology, plan is to discharge patient on Lasix 40 mg daily -Hold spironolactone and ACE due to CKD. -Nephrology as well to assist with diuresis in the setting of CKD stage IV -Appreciate cardiology assistance. -Patient will likely  require home oxygen. Evaluate for Home oxygen needs.  -Dyspnea improved, but patient is a still worried to go home without oxygen. VQ scan: Heterogeneous perfusion of the lung with multiple small subsegmental perfusion defects. IMPRESSION: Nondiagnostic (low or intermediate probability)    Hypertensive urgency:  Continue with Coreg, hydralazine- increased Imdur dose.  Will hold Cozaar and Aldactone due to CKD stage IV for now. Started on Norvasc.  BP controlled improving.    Elevation of troponin: In the setting of heart failure exacerbation.  Likely demand ischemia. Continue Imdur Coreg. He was a started on a baby aspirin. Cardiology  consulted and following.   AKI on CKD stage IV;  Baseline cr 2.8. Peak to 3.67 Concern for cardiorenal syndrome.  S/p IV lasix, switch to Lasix 40 mg p.o. twice daily on 1/18  Nephrology consulted. Cr  2.8---3.67 remained elevated   Multifocal PVCs: Status post AICD.  Suspicious AICD discharge night of admission. Cardiology consulted and following Received IV magnesium    Hyperlipidemia: Continue with a statin   BPH: Continue with Proscar and Flomax Mild emphysematous lung disease, seen on CT scan last admission, He was Supposed to have appointment office today with pulmonologist.  DuoNebs. Dr. Keenan Bachelor recommend PRN duoneb, Low dose prednisone, azithromycin.    Vitamin D insufficiency, started vitamin D 50,000 units p.o. weekly, follow with PCP and repeat vitamin D level after 3 months.    Estimated body mass index is 28.59 kg/m as calculated from the following:   Height as of this encounter: 5\' 11"  (1.803 m).   Weight as of this encounter: 93 kg.  Body mass index is 28.69 kg/m.  Interventions:  Diet: Heart healthy DVT Prophylaxis: Subcutaneous Lovenox   Advance goals of care discussion: Full code  Family Communication: family was not present at bedside, at the time of interview.  The pt provided permission to discuss  medical plan with the family. Opportunity was given to ask question and all questions were answered satisfactorily.   Disposition:  Pt is from Home, admitted with CC exacerbation, still has respiratory distress, which precludes a safe discharge. Discharge to home, when clinically stable, may require 1 to 2 days more.  Subjective:  No significant events overnight, patient is still feeling intermittent shortness of breath and he did ambulate, O2 saturation was 87% as per patient and then it improved.  Patient wants to go home with oxygen so we will try to qualify him. Pulse oximetry ordered and RN was advised to ambulate patient and to qualify him for home oxygen if he needs to be on oxygen. Patient denied any chest pain or palpitations, no any other active issues.   Physical Exam: General:  alert oriented to time, place, and person.  Appear in mild distress, affect appropriate Eyes: PERRLA ENT: Oral Mucosa Clear, moist  Neck: no JVD,  Cardiovascular: S1 and S2 Present, no Murmur,  Respiratory: good respiratory effort, Bilateral Air entry equal and Decreased, no Crackles, no wheezes Abdomen: Bowel Sound present, Soft and no tenderness,  Skin: no rashes Extremities: no Pedal edema, no calf tenderness Neurologic: without any new focal findings Gait not checked due to patient safety concerns  Vitals:   05/01/21 0941 05/01/21 1136 05/01/21 1519 05/01/21 1637  BP: (!) 163/57 (!) 155/72 (!) 151/60 (!) 156/55  Pulse: 61 64 65 (!) 33  Resp: 18 18 18    Temp: 98.1 F (36.7 C) 98.2 F (36.8 C) 97.7 F (36.5 C) 98.4 F (36.9 C)  TempSrc: Oral Oral  Oral  SpO2: 97% 97% 98% 97%  Weight:      Height:        Intake/Output Summary (Last 24 hours) at 05/01/2021 1641 Last data filed at 05/01/2021 1330 Gross per 24 hour  Intake 723 ml  Output 950 ml  Net -227 ml   Filed Weights   04/29/21 1803 04/30/21 0507 05/01/21 0416  Weight: 94.5 kg 93.3 kg 91.6 kg    Data Reviewed: I have  personally reviewed and interpreted daily labs, tele strips, imagings as discussed above. I reviewed all nursing notes, pharmacy notes, vitals, pertinent old records I have discussed plan of care as described above with RN and patient/family.  CBC: Recent Labs  Lab 04/27/21 1305 04/27/21 2246  WBC 7.0 6.3  HGB 11.4* 10.9*  HCT 35.0* 33.1*  MCV 89.7 90.9  PLT 168 588*   Basic Metabolic Panel: Recent Labs  Lab 04/27/21 1305 04/27/21 2246 04/28/21 0611 04/29/21 0611 04/30/21 0729 05/01/21 0708  NA 138  --  142 141 139 134*  K 4.0   3.9  --  3.5 3.6 3.8 3.3*  CL 108  --  109 108 105 101  CO2 24  --  24 26 28 27   GLUCOSE 114*  --  87 92 87 120*  BUN 40*  --  44* 44* 46* 52*  CREATININE 2.83* 3.10* 3.04* 2.81* 3.67* 3.67*  CALCIUM 8.2*  --  8.1* 8.1* 8.1* 7.6*  MG 1.8  --   --   --   --   --     Studies: NM Pulmonary Perfusion  Result Date: 05/01/2021 CLINICAL DATA:  Shortness of breath EXAM: NUCLEAR  MEDICINE PERFUSION LUNG SCAN TECHNIQUE: Perfusion images were obtained in multiple projections after intravenous injection of radiopharmaceutical. Ventilation scans intentionally deferred if perfusion scan and chest x-ray adequate for interpretation during COVID 19 epidemic. RADIOPHARMACEUTICALS:  4.1 mCi Tc-36m MAA IV COMPARISON:  Same day chest radiograph FINDINGS: Heterogeneous perfusion of the lung with multiple small subsegmental perfusion defects. IMPRESSION: Nondiagnostic (low or intermediate probability) Electronically Signed   By: Yetta Glassman M.D.   On: 05/01/2021 14:48   DG Chest Port 1 View  Result Date: 05/01/2021 CLINICAL DATA:  Shortness of breath EXAM: PORTABLE CHEST 1 VIEW COMPARISON:  Previous studies including the examination of 04/27/2021 FINDINGS: Transverse diameter of heart is increased. Pacemaker battery is seen in the left infraclavicular region. Biventricular pacer leads are noted in place. Central pulmonary vessels are more prominent. Subtle increase in  interstitial markings are seen in the lower lung fields, more so on the right side. Costophrenic angles are clear. There is no pneumothorax. IMPRESSION: Cardiomegaly. Central pulmonary vessels are prominent without signs of alveolar pulmonary edema. Increased interstitial markings are seen in the both lower lung fields, more so on the right side which may suggest mild asymmetric interstitial pulmonary edema or interstitial pneumonia. Electronically Signed   By: Elmer Picker M.D.   On: 05/01/2021 13:30    Scheduled Meds:  amLODipine  10 mg Oral Daily   atorvastatin  40 mg Oral q1800   azithromycin  250 mg Oral Daily   carvedilol  6.25 mg Oral BID WC   clobetasol cream   Topical BID   clopidogrel  75 mg Oral Daily   enoxaparin (LOVENOX) injection  30 mg Subcutaneous Q24H   finasteride  5 mg Oral Daily   [START ON 05/02/2021] furosemide  40 mg Oral Daily   guaiFENesin  600 mg Oral BID   hydrALAZINE  100 mg Oral Q8H   isosorbide mononitrate  60 mg Oral Daily   loratadine  10 mg Oral Daily   mouth rinse  15 mL Mouth Rinse BID   pantoprazole  40 mg Oral Daily   predniSONE  10 mg Oral Q breakfast   sodium chloride flush  3 mL Intravenous Q12H   tamsulosin  0.4 mg Oral Daily   cyanocobalamin  1,000 mcg Oral Daily   Vitamin D (Ergocalciferol)  50,000 Units Oral Q7 days   Continuous Infusions:  sodium chloride     PRN Meds: sodium chloride, acetaminophen, ALPRAZolam, ipratropium-albuterol, labetalol, nitroGLYCERIN, ondansetron (ZOFRAN) IV, sodium chloride flush  Time spent: 35 minutes  Author: Val Riles. MD [facility] 05/01/2021 4:41 PM  To reach On-call, see care teams to locate the attending and reach out to them via [email or web address]. If 7PM-7AM, please contact night-coverage If you still have difficulty reaching the attending provider, please page the [facility] (Director on Call) for [facility] on amion for assistance.

## 2021-05-01 NOTE — Progress Notes (Signed)
Physical Therapy Treatment Patient Details Name: [name] MRN: [id number] DOB: [birth year] Today's Date: 05/01/2021   History of Present Illness Peter [name] is a 81 y.o. Caucasian male with medical history significant for systolic and diastolic CHF, coronary artery disease, hypertension, dyslipidemia and coronary artery disease and non-STEMI status post PCI and stent, and status post AICD, who presented to the ER with acute onset of significantly worsening dyspnea today.    PT Comments    Pt received in bed with visitor present. Agreeable to PT. Remains mod-I for bed mobility and STS and supervision for ambulation. Pt remained on 1L/min throughout. Tolerating progressive ambulation to >400' with focus on dynamic balance with gait changes, sudden stops, turning in circles CCW without LOB. Pt returned seated EoB with reports of minor SOB. Improved with seated rest ~1 min. SPO2 > 90% post mobility. D/c recs remain appropriate due to endurance deficits and strengthening.     Recommendations for follow up therapy are one component of a multi-disciplinary discharge planning process, led by the attending physician.  Recommendations may be updated based on patient status, additional functional criteria and insurance authorization.  Follow Up Recommendations  Home health PT     Assistance Recommended at Discharge Intermittent Supervision/Assistance  Patient can return home with the following Assist for transportation;Help with stairs or ramp for entrance   Equipment Recommendations  Rolling walker (2 wheels)    Recommendations for Other Services       Precautions / Restrictions Precautions Precautions: Fall Restrictions Weight Bearing Restrictions: No     Mobility  Bed Mobility Overal bed mobility: Modified Independent               Patient Response: Cooperative  Transfers Overall transfer level: Modified independent Equipment used: None Transfers: Sit to/from  Stand Sit to Stand: Modified independent (Device/Increase time)                Ambulation/Gait Ambulation/Gait assistance: Supervision Gait Distance (Feet): 420 Feet Assistive device: None Gait Pattern/deviations: Step-through pattern, Decreased stride length       General Gait Details: Steadiness continuing to improve with dynamic balance tasks.   Stairs             Wheelchair Mobility    Modified Rankin (Stroke Patients Only)       Balance Overall balance assessment: Needs assistance Sitting-balance support: Feet supported Sitting balance-Leahy Scale: Good     Standing balance support: No upper extremity supported Standing balance-Leahy Scale: Good               High level balance activites: Sudden stops High Level Balance Comments: Changes in gait speed, sudden stops, mimicking stepping over curb, turning in circle CCW.            Cognition Arousal/Alertness: Awake/alert Behavior During Therapy: WFL for tasks assessed/performed Overall Cognitive Status: Within Functional Limits for tasks assessed                                          Exercises Other Exercises Other Exercises: Dynamic balance tasks, endurance with prolonged ambulation    General Comments General comments (skin integrity, edema, etc.): SPO2 on 1L/min > 90% throughout. HR maintained in mid 90's BPM with mobility. MInor SOB noted after ambulation.      Pertinent Vitals/Pain Pain Assessment Pain Assessment: No/denies pain    Home Living  Prior Function            PT Goals (current goals can now be found in the care plan section) Acute Rehab PT Goals Patient Stated Goal: Was initially planning for Rehab placement, Is going home. PT Goal Formulation: With patient/family Time For Goal Achievement: 05/12/21 Potential to Achieve Goals: Poor Progress towards PT goals: Not progressing toward goals - comment     Frequency    Min 2X/week      PT Plan Current plan remains appropriate    Co-evaluation              AM-PAC PT "6 Clicks" Mobility   Outcome Measure  Help needed turning from your back to your side while in a flat bed without using bedrails?: None Help needed moving from lying on your back to sitting on the side of a flat bed without using bedrails?: None Help needed moving to and from a bed to a chair (including a wheelchair)?: None Help needed standing up from a chair using your arms (e.g., wheelchair or bedside chair)?: A Little Help needed to walk in hospital room?: A Little Help needed climbing 3-5 steps with a railing? : A Little 6 Click Score: 21    End of Session Equipment Utilized During Treatment: Gait belt;Oxygen Activity Tolerance: Patient tolerated treatment well Patient left: in bed;with family/visitor present Nurse Communication: Mobility status PT Visit Diagnosis: Difficulty in walking, not elsewhere classified (R26.2);Muscle weakness (generalized) (M62.81);Unsteadiness on feet (R26.81)     Time: 1324-4010 PT Time Calculation (min) (ACUTE ONLY): 15 min  Charges:  $Neuromuscular Re-education: 8-22 mins                    Salem Caster. Fairly IV, PT, DPT Physical Therapist- [facility]  05/01/2021, 4:08 PM

## 2021-05-01 NOTE — Progress Notes (Signed)
[facility]  ROUNDING NOTE   Subjective:   DOCTOR SHEAHAN is a 81 y.o. 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  Patient presents to the emergency department with complaint of worsening shortness of breath.  Patient describes a sudden onset.  Reports shortness of breath with activity and during the night.  Patient has been admitted for Elevated troponin [R77.8] Acute exacerbation of CHF (congestive heart failure) (HCC) [I50.9] Acute on chronic congestive heart failure, unspecified heart failure type [facility]) [I50.9]  Patient is known to our practice and receives outpatient care from Dr. Juleen China.   Patient sitting at side of bed Currently eating breakfast Denies shortness of breath, weaned to room air  Creatinine unchanged  UOP 958ml in 24 hours   Objective:  Vital signs in last 24 hours:  Temp:  [97.6 F (36.4 C)-98.4 F (36.9 C)] 98.1 F (36.7 C) (01/19 0941) Pulse Rate:  [53-71] 61 (01/19 0941) Resp:  [16-20] 18 (01/19 0941) BP: (138-185)/(54-83) 163/57 (01/19 0941) SpO2:  [94 %-99 %] 97 % (01/19 0941) Weight:  [91.6 kg] 91.6 kg (01/19 0416)  Weight change: -2.858 kg Filed Weights   04/29/21 1803 04/30/21 0507 05/01/21 0416  Weight: 94.5 kg 93.3 kg 91.6 kg    Intake/Output: I/O last 3 completed shifts: In: 480 [P.O.:480] Out: 1900 [Urine:1900]   Intake/Output this shift:  Total I/O In: 243 [P.O.:240; I.V.:3] Out: 350 [Urine:350]  Physical Exam: General: NAD, resting in bed  Head: Normocephalic, atraumatic. Moist oral mucosal membranes  Eyes: Anicteric  Lungs:  Diminished bases, normal effort, room air  Heart: Regular rate and rhythm  Abdomen:  Soft, nontender  Extremities: no peripheral edema.  Neurologic: Nonfocal, moving all four extremities  Skin: No lesions       Basic Metabolic  Panel: Recent Labs  Lab 04/27/21 1305 04/27/21 2246 04/28/21 0611 04/29/21 0611 04/30/21 0729 05/01/21 0708  NA 138  --  142 141 139 134*  K 4.0   3.9  --  3.5 3.6 3.8 3.3*  CL 108  --  109 108 105 101  CO2 24  --  24 26 28 27   GLUCOSE 114*  --  87 92 87 120*  BUN 40*  --  44* 44* 46* 52*  CREATININE 2.83* 3.10* 3.04* 2.81* 3.67* 3.67*  CALCIUM 8.2*  --  8.1* 8.1* 8.1* 7.6*  MG 1.8  --   --   --   --   --      Liver Function Tests: No results for input(s): AST, ALT, ALKPHOS, BILITOT, PROT, ALBUMIN in the last 168 hours. No results for input(s): LIPASE, AMYLASE in the last 168 hours. No results for input(s): AMMONIA in the last 168 hours.  CBC: Recent Labs  Lab 04/27/21 1305 04/27/21 2246  WBC 7.0 6.3  HGB 11.4* 10.9*  HCT 35.0* 33.1*  MCV 89.7 90.9  PLT 168 144*     Cardiac Enzymes: No results for input(s): CKTOTAL, CKMB, CKMBINDEX, TROPONINI in the last 168 hours.  BNP: Invalid input(s): POCBNP  CBG: No results for input(s): GLUCAP in the last 168 hours.  Microbiology: Results for orders placed or performed during the hospital encounter of 04/27/21  Resp Panel by RT-PCR (Flu A&B, Covid) Nasopharyngeal Swab     Status: None   Collection Time: 04/27/21  6:30 PM   Specimen: Nasopharyngeal Swab; Nasopharyngeal(NP) swabs in vial transport  medium  Result Value Ref Range Status   SARS Coronavirus 2 by RT PCR NEGATIVE NEGATIVE Final    Comment: (NOTE) SARS-CoV-2 target nucleic acids are NOT DETECTED.  The SARS-CoV-2 RNA is generally detectable in upper respiratory specimens during the acute phase of infection. The lowest concentration of SARS-CoV-2 viral copies this assay can detect is 138 copies/mL. A negative result does not preclude SARS-Cov-2 infection and should not be used as the sole basis for treatment or other patient management decisions. A negative result may occur with  improper specimen collection/handling, submission of specimen other than  nasopharyngeal swab, presence of viral mutation(s) within the areas targeted by this assay, and inadequate number of viral copies(<138 copies/mL). A negative result must be combined with clinical observations, patient history, and epidemiological information. The expected result is Negative.  Fact Sheet for Patients:  EntrepreneurPulse.com.au  Fact Sheet for Healthcare Providers:  IncredibleEmployment.be  This test is no t yet approved or cleared by the [place] FDA and  has been authorized for detection and/or diagnosis of SARS-CoV-2 by FDA under an Emergency Use Authorization (EUA). This EUA will remain  in effect (meaning this test can be used) for the duration of the COVID-19 declaration under Section 564(b)(1) of the Act, 21 U.S.C.section 360bbb-3(b)(1), unless the authorization is terminated  or revoked sooner.       Influenza A by PCR NEGATIVE NEGATIVE Final   Influenza B by PCR NEGATIVE NEGATIVE Final    Comment: (NOTE) The Xpert Xpress SARS-CoV-2/FLU/RSV plus assay is intended as an aid in the diagnosis of influenza from Nasopharyngeal swab specimens and should not be used as a sole basis for treatment. Nasal washings and aspirates are unacceptable for Xpert Xpress SARS-CoV-2/FLU/RSV testing.  Fact Sheet for Patients: EntrepreneurPulse.com.au  Fact Sheet for Healthcare Providers: IncredibleEmployment.be  This test is not yet approved or cleared by the [place] FDA and has been authorized for detection and/or diagnosis of SARS-CoV-2 by FDA under an Emergency Use Authorization (EUA). This EUA will remain in effect (meaning this test can be used) for the duration of the COVID-19 declaration under Section 564(b)(1) of the Act, 21 U.S.C. section 360bbb-3(b)(1), unless the authorization is terminated or revoked.  Performed at [facility], [address] [place], [place]  [address]     Coagulation Studies: No results for input(s): LABPROT, INR in the last 72 hours.  Urinalysis: No results for input(s): COLORURINE, LABSPEC, PHURINE, GLUCOSEU, HGBUR, BILIRUBINUR, KETONESUR, PROTEINUR, UROBILINOGEN, NITRITE, LEUKOCYTESUR in the last 72 hours.  Invalid input(s): APPERANCEUR    Imaging: No results found.   Medications:    sodium chloride      amLODipine  10 mg Oral Daily   atorvastatin  40 mg Oral q1800   azithromycin  250 mg Oral Daily   carvedilol  6.25 mg Oral BID WC   clobetasol cream   Topical BID   clopidogrel  75 mg Oral Daily   enoxaparin (LOVENOX) injection  30 mg Subcutaneous Q24H   finasteride  5 mg Oral Daily   [START ON 05/02/2021] furosemide  40 mg Oral Daily   guaiFENesin  600 mg Oral BID   hydrALAZINE  100 mg Oral Q8H   isosorbide mononitrate  60 mg Oral Daily   loratadine  10 mg Oral Daily   mouth rinse  15 mL Mouth Rinse BID   pantoprazole  40 mg Oral Daily   predniSONE  10 mg Oral Q breakfast   sodium chloride flush  3 mL Intravenous Q12H  tamsulosin  0.4 mg Oral Daily   cyanocobalamin  1,000 mcg Oral Daily   Vitamin D (Ergocalciferol)  50,000 Units Oral Q7 days   sodium chloride, acetaminophen, ALPRAZolam, ipratropium-albuterol, labetalol, nitroGLYCERIN, ondansetron (ZOFRAN) IV, sodium chloride flush  Assessment/ Plan:  [name] is a 81 y.o.  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  Patient presents to the emergency department with complaint of worsening shortness of breath.  Patient describes a sudden onset.  Reports shortness of breath with activity and during the night.  Patient has been admitted for Elevated troponin [R77.8] Acute exacerbation of CHF (congestive heart failure) ([place]) [I50.9] Acute on chronic congestive heart failure, unspecified heart failure  type ([place]) [I50.9]   Acute Kidney Injury on chronic kidney disease stage IV with baseline creatinine 2.8 and GFR of 22 on 04/27/21.  Acute kidney injury possibly secondary to cardiorenal syndrome Chronic kidney disease is secondary to multiple factors including hypertension and extensive cardiac history. Losartan held. No indication for dialysis at this time. No IV contrast exposure Creatinine unchanged. Adequate urine output. Will hold diuretic today and monitor.   Lab Results  Component Value Date   CREATININE 3.67 (H) 05/01/2021   CREATININE 3.67 (H) 04/30/2021   CREATININE 2.81 (H) 04/29/2021    Intake/Output Summary (Last 24 hours) at 05/01/2021 1132 Last data filed at 05/01/2021 0950 Gross per 24 hour  Intake 723 ml  Output 950 ml  Net -227 ml    2.  Hypotension with chronic kidney disease.  Home regimen includes carvedilol, furosemide, hydralazine, isosorbide, losartan, and spironolactone.  Currently also receiving amlodipine, carvedilol, and isosorbide. BP 163/57  3.  Combined systolic and diastolic heart failure.  Followed by Dr. Jerrye Beavers.  Echo completed on 04/04/2021 indicates EF of 50 to 55% with mild LVH and grade 2 diastolic dysfunction.  With right ventricular enlargement. Cardiology following   LOS: 4 [place] 1/19/202311:32 AM

## 2021-05-02 LAB — BASIC METABOLIC PANEL
Anion gap: 6 (ref 5–15)
BUN: 59 mg/dL — ABNORMAL HIGH (ref 8–23)
CO2: 26 mmol/L (ref 22–32)
Calcium: 7.8 mg/dL — ABNORMAL LOW (ref 8.9–10.3)
Chloride: 107 mmol/L (ref 98–111)
Creatinine, Ser: 3.77 mg/dL — ABNORMAL HIGH (ref 0.61–1.24)
GFR, Estimated: 15 mL/min — ABNORMAL LOW (ref >60)
Glucose, Bld: 87 mg/dL (ref 70–99)
Potassium: 3.5 mmol/L (ref 3.5–5.1)
Sodium: 139 mmol/L (ref 135–145)

## 2021-05-02 MED ORDER — ISOSORBIDE MONONITRATE ER 60 MG PO TB24: 60.0000 mg | ORAL_TABLET | Freq: Every day | ORAL | 0 refills | Status: DC

## 2021-05-02 MED ORDER — AMLODIPINE BESYLATE 10 MG PO TABS: 10.0000 mg | ORAL_TABLET | Freq: Every day | ORAL | 0 refills | Status: DC

## 2021-05-02 MED ORDER — FUROSEMIDE 40 MG PO TABS: 40.0000 mg | ORAL_TABLET | Freq: Every day | ORAL | Status: DC

## 2021-05-02 NOTE — TOC Progression Note (Signed)
Transition of Care [facility]) - Progression Note    Patient Details  Name: Peter [name] MRN: [id number] Date of Birth: March 25, 1941  Transition of Care [facility]) CM/SW [place], [place] Phone Number: 05/02/2021, 12:46 PM  Clinical Narrative:     CSW spoke with patient's daughter Peter [name] who reports patient is set up at home with [facility] and an overnight caregiver. She reports patient plan is to discharge home at this time. She did express interest in long term facility options for Alf, she was agreeable to receive information for [employer] referral for them to continue to follow up with her regarding any assistance they can provide.   No further discharge needs identified at this time.   Expected Discharge Plan: [place] Barriers to Discharge: Continued Medical Work up  Expected Discharge Plan and Services Expected Discharge Plan: [place]   Discharge Planning Services: CM Consult Post Acute Care Choice: [place] Living arrangements for the past 2 months: Single Family Home                 DME Arranged: N/A DME Agency: NA       HH Arranged: NA HH Agency: NA         Social Determinants of Health (SDOH) Interventions    Readmission Risk Interventions Readmission Risk Prevention Plan 04/28/2021  Transportation Screening Complete  PCP or Specialist Appt within 3-5 Days Complete  HRI or [place] Complete  Social Work Consult for [place] Planning/Counseling Complete  Palliative Care Screening Not Applicable  Medication Review Press photographer) Complete  Some recent data might be hidden

## 2021-05-02 NOTE — TOC Transition Note (Addendum)
Transition of Care [facility]) - CM/SW Discharge Note   Patient Details  Name: Peter [name] MRN: [id number] Date of Birth: 1940/05/02  Transition of Care [facility]) CM/SW Contact:  Magnus Ivan, LCSW Phone Number: 05/02/2021, 2:50 PM   Clinical Narrative:   Patient to DC home today. Patient did not qualify for home o2 with ambulation test per RN. However, per [facility] with [employer], patient can still qualify under the PHE. Notified MDs of progress note needed. Thedore Mins will reach out to patient's daughter to discuss delivery. Per Thedore Mins, o2 will be delivered to patient's home, not hospital room.  Notified MD, Pulmonologist, and RN.   Final next level of care: Home/Self Care Barriers to Discharge: Barriers Resolved   Patient Goals and CMS Choice Patient states their goals for this hospitalization and ongoing recovery are:: To get strong enough in rehab and hopefully get back home CMS Medicare.gov Compare Post Acute Care list provided to:: Patient Choice offered to / list presented to : Patient  Discharge Placement                       Discharge Plan and Services   Discharge Planning Services: CM Consult Post Acute Care Choice: [place]          DME Arranged: Oxygen DME Agency: [employer] Date DME Agency Contacted: 05/02/21   Representative spoke with at DME Agency: Thedore Mins HH Arranged: NA [place] Agency: NA        Social Determinants of Health ([place]) Interventions     Readmission Risk Interventions Readmission Risk Prevention Plan 04/28/2021  Transportation Screening Complete  PCP or Specialist Appt within 3-5 Days Complete  HRI or [place] Complete  Social Work Consult for [place] Planning/Counseling Complete  Palliative Care Screening Not Applicable  Medication Review Press photographer) Complete  Some recent data might be hidden

## 2021-05-02 NOTE — Progress Notes (Signed)
[facility]  ROUNDING NOTE   Subjective:   Peter [name] is a 81 y.o. 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  Patient presents to the emergency department with complaint of worsening shortness of breath.  Patient describes a sudden onset.  Reports shortness of breath with activity and during the night.  Patient has been admitted for Elevated troponin [R77.8] Acute exacerbation of CHF (congestive heart failure) (HCC) [I50.9] Acute on chronic congestive heart failure, unspecified heart failure type [facility]) [I50.9]  Patient is known to our practice and receives outpatient care from Dr. Juleen China.   Patient seen resting in bed Recently completed breakfast Currently on room air, but has some shortness of breath with exertion.   Urine output of 1.1L recorded in 24 hours Creatinine 3.77   Objective:  Vital signs in last 24 hours:  Temp:  [97.6 F (36.4 C)-98.5 F (36.9 C)] 97.6 F (36.4 C) (01/20 0810) Pulse Rate:  [59-75] 59 (01/20 0810) Resp:  [14-19] 14 (01/20 0810) BP: (150-156)/(55-72) 156/58 (01/20 0810) SpO2:  [94 %-98 %] 94 % (01/20 0810) Weight:  [96.1 kg] 96.1 kg (01/20 0500)  Weight change: 4.445 kg Filed Weights   04/30/21 0507 05/01/21 0416 05/02/21 0500  Weight: 93.3 kg 91.6 kg 96.1 kg    Intake/Output: I/O last 3 completed shifts: In: 723 [P.O.:720; I.V.:3] Out: 1725 [Urine:1725]   Intake/Output this shift:  Total I/O In: 240 [P.O.:240] Out: 300 [Urine:300]  Physical Exam: General: NAD, resting in bed  Head: Normocephalic, atraumatic. Moist oral mucosal membranes  Eyes: Anicteric  Lungs:  Diminished bases, normal effort, room air  Heart: Regular rate and rhythm  Abdomen:  Soft, nontender  Extremities: no peripheral edema.  Neurologic: Nonfocal, moving all four extremities  Skin: No  lesions       Basic Metabolic Panel: Recent Labs  Lab 04/27/21 1305 04/27/21 2246 04/28/21 0611 04/29/21 0611 04/30/21 0729 05/01/21 0708 05/02/21 0652  NA 138  --  142 141 139 134* 139  K 4.0   3.9  --  3.5 3.6 3.8 3.3* 3.5  CL 108  --  109 108 105 101 107  CO2 24  --  24 26 28 27 26   GLUCOSE 114*  --  87 92 87 120* 87  BUN 40*  --  44* 44* 46* 52* 59*  CREATININE 2.83*   < > 3.04* 2.81* 3.67* 3.67* 3.77*  CALCIUM 8.2*  --  8.1* 8.1* 8.1* 7.6* 7.8*  MG 1.8  --   --   --   --   --   --    < > = values in this interval not displayed.     Liver Function Tests: No results for input(s): AST, ALT, ALKPHOS, BILITOT, PROT, ALBUMIN in the last 168 hours. No results for input(s): LIPASE, AMYLASE in the last 168 hours. No results for input(s): AMMONIA in the last 168 hours.  CBC: Recent Labs  Lab 04/27/21 1305 04/27/21 2246  WBC 7.0 6.3  HGB 11.4* 10.9*  HCT 35.0* 33.1*  MCV 89.7 90.9  PLT 168 144*     Cardiac Enzymes: No results for input(s): CKTOTAL, CKMB, CKMBINDEX, TROPONINI in the last 168 hours.  BNP: Invalid input(s): POCBNP  CBG: No results for input(s): GLUCAP in the last 168 hours.  Microbiology: Results for orders placed or performed during the hospital encounter of 04/27/21  Resp Panel by RT-PCR (Flu A&B, Covid) Nasopharyngeal Swab     Status: None   Collection Time: 04/27/21  6:30 PM   Specimen: Nasopharyngeal Swab; Nasopharyngeal(NP) swabs in vial transport medium  Result Value Ref Range Status   SARS Coronavirus 2 by RT PCR NEGATIVE NEGATIVE Final    Comment: (NOTE) SARS-CoV-2 target nucleic acids are NOT DETECTED.  The SARS-CoV-2 RNA is generally detectable in upper respiratory specimens during the acute phase of infection. The lowest concentration of SARS-CoV-2 viral copies this assay can detect is 138 copies/mL. A negative result does not preclude SARS-Cov-2 infection and should not be used as the sole basis for treatment or other patient  management decisions. A negative result may occur with  improper specimen collection/handling, submission of specimen other than nasopharyngeal swab, presence of viral mutation(s) within the areas targeted by this assay, and inadequate number of viral copies(<138 copies/mL). A negative result must be combined with clinical observations, patient history, and epidemiological information. The expected result is Negative.  Fact Sheet for Patients:  EntrepreneurPulse.com.au  Fact Sheet for Healthcare Providers:  IncredibleEmployment.be  This test is no t yet approved or cleared by the [place] FDA and  has been authorized for detection and/or diagnosis of SARS-CoV-2 by FDA under an Emergency Use Authorization (EUA). This EUA will remain  in effect (meaning this test can be used) for the duration of the COVID-19 declaration under Section 564(b)(1) of the Act, 21 U.S.C.section 360bbb-3(b)(1), unless the authorization is terminated  or revoked sooner.       Influenza A by PCR NEGATIVE NEGATIVE Final   Influenza B by PCR NEGATIVE NEGATIVE Final    Comment: (NOTE) The Xpert Xpress SARS-CoV-2/FLU/RSV plus assay is intended as an aid in the diagnosis of influenza from Nasopharyngeal swab specimens and should not be used as a sole basis for treatment. Nasal washings and aspirates are unacceptable for Xpert Xpress SARS-CoV-2/FLU/RSV testing.  Fact Sheet for Patients: EntrepreneurPulse.com.au  Fact Sheet for Healthcare Providers: IncredibleEmployment.be  This test is not yet approved or cleared by the [place] FDA and has been authorized for detection and/or diagnosis of SARS-CoV-2 by FDA under an Emergency Use Authorization (EUA). This EUA will remain in effect (meaning this test can be used) for the duration of the COVID-19 declaration under Section 564(b)(1) of the Act, 21 U.S.C. section 360bbb-3(b)(1),  unless the authorization is terminated or revoked.  Performed at [facility], [address] [place], [place] [address]     Coagulation Studies: No results for input(s): LABPROT, INR in the last 72 hours.  Urinalysis: No results for input(s): COLORURINE, LABSPEC, PHURINE, GLUCOSEU, HGBUR, BILIRUBINUR, KETONESUR, PROTEINUR, UROBILINOGEN, NITRITE, LEUKOCYTESUR in the last 72 hours.  Invalid input(s): APPERANCEUR    Imaging: NM Pulmonary Perfusion  Result Date: 05/01/2021 CLINICAL DATA:  Shortness of breath EXAM: NUCLEAR MEDICINE PERFUSION LUNG SCAN TECHNIQUE: Perfusion images were obtained in multiple projections after intravenous injection of radiopharmaceutical. Ventilation scans intentionally deferred if perfusion scan and chest x-ray adequate for interpretation during COVID 19 epidemic. RADIOPHARMACEUTICALS:  4.1 mCi Tc-101m MAA IV COMPARISON:  Same day chest radiograph FINDINGS: Heterogeneous perfusion of the lung with multiple small subsegmental perfusion defects. IMPRESSION: Nondiagnostic (low or intermediate probability) Electronically Signed   By: Yetta Glassman M.D.   On: 05/01/2021 14:48   DG Chest Port 1 View  Result Date: 05/01/2021 CLINICAL DATA:  Shortness of breath EXAM: PORTABLE CHEST 1 VIEW COMPARISON:  Previous studies including the examination of 04/27/2021 FINDINGS: Transverse diameter of  heart is increased. Pacemaker battery is seen in the left infraclavicular region. Biventricular pacer leads are noted in place. Central pulmonary vessels are more prominent. Subtle increase in interstitial markings are seen in the lower lung fields, more so on the right side. Costophrenic angles are clear. There is no pneumothorax. IMPRESSION: Cardiomegaly. Central pulmonary vessels are prominent without signs of alveolar pulmonary edema. Increased interstitial markings are seen in the both lower lung fields, more so on the right side which may suggest mild asymmetric  interstitial pulmonary edema or interstitial pneumonia. Electronically Signed   By: Elmer Picker M.D.   On: 05/01/2021 13:30     Medications:    sodium chloride      amLODipine  10 mg Oral Daily   atorvastatin  40 mg Oral q1800   azithromycin  250 mg Oral Daily   carvedilol  6.25 mg Oral BID WC   clobetasol cream   Topical BID   clopidogrel  75 mg Oral Daily   enoxaparin (LOVENOX) injection  30 mg Subcutaneous Q24H   finasteride  5 mg Oral Daily   furosemide  40 mg Oral Daily   guaiFENesin  600 mg Oral BID   hydrALAZINE  100 mg Oral Q8H   isosorbide mononitrate  60 mg Oral Daily   loratadine  10 mg Oral Daily   mouth rinse  15 mL Mouth Rinse BID   pantoprazole  40 mg Oral Daily   predniSONE  10 mg Oral Q breakfast   sodium chloride flush  3 mL Intravenous Q12H   tamsulosin  0.4 mg Oral Daily   cyanocobalamin  1,000 mcg Oral Daily   Vitamin D (Ergocalciferol)  50,000 Units Oral Q7 days   sodium chloride, acetaminophen, ALPRAZolam, ipratropium-albuterol, labetalol, nitroGLYCERIN, ondansetron (ZOFRAN) IV, sodium chloride flush  Assessment/ Plan:  Mr. XERXES AGRUSA is a 81 y.o.  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  Patient presents to the emergency department with complaint of worsening shortness of breath.  Patient describes a sudden onset.  Reports shortness of breath with activity and during the night.  Patient has been admitted for Elevated troponin [R77.8] Acute exacerbation of CHF (congestive heart failure) ([place]) [I50.9] Acute on chronic congestive heart failure, unspecified heart failure type ([place]) [I50.9]   Acute Kidney Injury on chronic kidney disease stage IV with baseline creatinine 2.8 and GFR of 22 on 04/27/21.  Acute kidney injury possibly secondary to cardiorenal syndrome Chronic kidney disease is  secondary to multiple factors including hypertension and extensive cardiac history. Losartan held. No indication for dialysis at this time. No IV contrast exposure Creatinine relatively unchanged. Adequate urine output. Restarting Furosemide 40mg  oral daily today   Lab Results  Component Value Date   CREATININE 3.77 (H) 05/02/2021   CREATININE 3.67 (H) 05/01/2021   CREATININE 3.67 (H) 04/30/2021    Intake/Output Summary (Last 24 hours) at 05/02/2021 1054 Last data filed at 05/02/2021 1021 Gross per 24 hour  Intake 720 ml  Output 1075 ml  Net -355 ml    2.  Hypotension with chronic kidney disease.  Home regimen includes carvedilol, furosemide, hydralazine, isosorbide, losartan, and spironolactone.  Currently also receiving amlodipine, carvedilol, and isosorbide. BP 156/58  3.  Combined systolic and diastolic heart failure.  Followed by Dr. Jerrye Beavers.  Echo completed on 04/04/2021 indicates EF of 50 to 55% with mild LVH and grade 2  diastolic dysfunction.  With right ventricular enlargement. Cardiology following   LOS: 5 [place] 1/20/202310:54 AM

## 2021-05-02 NOTE — Plan of Care (Signed)
  Problem: Education: Goal: Knowledge of General Education information will improve Description Including pain rating scale, medication(s)/side effects and non-pharmacologic comfort measures Outcome: Progressing   

## 2021-05-02 NOTE — Plan of Care (Signed)
Patient had a recurrent admission due to hypoxia at home.  Currently patient is saturating well on room air and O2 sat remained above 92% on ambulation.  But patient is afraid to go home and in case if he develops hypoxia again so he would like to have the oxygen arranged before the discharge. So we will arrange oxygen for home use as needed, 2 L oxygen via nasal cannula. Discussed with Counselling psychologist.

## 2021-05-02 NOTE — Progress Notes (Signed)
Mobility Specialist - Progress Note   05/02/21 1400  Mobility  Activity Ambulated independently in hallway  Level of Assistance Independent  Assistive Device None  Distance Ambulated (ft) 220 ft  Activity Response Tolerated well  $Mobility charge 1 Mobility    O2 while resting on RA = 97% O2 while AMB on RA = 92% O2 while AMB on ?L = N/A    Pt ambulated in hallway independently. O2 sats recorded above, however, pt states he would like home O2. Pt left EOB with needs in reach.    Kathee Delton Mobility Specialist 05/02/21, 2:23 PM

## 2021-05-02 NOTE — Progress Notes (Signed)
PULMONOLOGY         Date: 05/02/2021,   MRN# [id number] Peter [name] 02-27-1941     AdmissionWeight: 93 kg                 CurrentWeight: 96.1 kg   Referring physician: Dr Tyrell Antonio   CHIEF COMPLAINT:   Acute hypoxemic respiratory failure   HISTORY OF PRESENT ILLNESS   Peter [name] is a 81 y.o. Caucasian male with medical history significant for systolic and diastolic CHF, coronary artery disease, hypertension, dyslipidemia and coronary artery disease and non-STEMI status post PCI and stent, and status post AICD, who presented to the ER with acute onset of significantly worsening dyspnea today.  He was noted to have orthopnea and paroxysmal nocturnal dyspnea as well as dyspnea on exertion.  Patient was admitted here from 12/22 to 04/09/2021 for symptomatic anemia for which she received 2 units of packed red blood cells and acute on chronic diastolic CHF and was then diuresed with IV Lasix however given worsening acute kidney injury on CKD stage IV his Lasix was held off.  He stated that he is not taking Lasix since then.  He was discharged to rehabitation and has been on oxygen there.  He was discharged home from rehab today and did not have oxygen at home.  He has been otherwise compliant with all of his medications.  The patient's troponins were elevated in the 200s during his last admission.  Pulse oximetry was down to 90% on room air.  The patient felt like his defibrillator may have went off while in the waiting room.  It was interrogated by the ER physician and showed increased PVCs count.  When he came to the ER, blood pressure was 197/85 and respiratory rate 25 with a pulse symmetry 93% on room air and has gone down to 90% on ambulation.  BP later normalized up to 201/94.  BMP tonight showed a BUN of 40 and creatinine 2.83 compared to 47/3.26 on 9/9.  At discharge on 12/28 BUN was 57 and creatinine 3.13 up from 67/2.95 at the time of his admission on 12/22.  BNP was  2988.6.  High-sensitivity troponin was 253 and later 259.  CBC showed hemoglobin of 11.4 hematocrit 35 up from previous levels.  Influenza antigens and COVID-19 PCR came back negative.  The patient was given 40 mg of IV Lasix.  He will be admitted to a cardiac telemetry bed for further evaluation and management.  05/01/21- patient is improved, he is walking with mild hypoxemia. He is stable for dc home   PAST MEDICAL HISTORY   Past Medical History:  Diagnosis Date   Anginal pain ([place])    Asthma    Bladder infection, acute 03/2011   "had a whole lot of bleeding from this"   CHF (congestive heart failure) ([place])    Coronary artery disease    High cholesterol    Hypertension    Macular degeneration 12/14/2011   "had it in my right; getting shots now in my left"   Myocardial infarction [facility]) 1996   NSTEMI (non-ST elevated myocardial infarction) ([place]) 12/14/2011   Shortness of breath 12/14/2011   "@ rest, lying down, w/exertion"     SURGICAL HISTORY   Past Surgical History:  Procedure Laterality Date   [place]; ~ 2003; ~ 2008   "total of 3"   CORONARY STENT INTERVENTION N/A 08/10/2016   Procedure: Coronary Stent Intervention;  Surgeon:  [name], MD;  Location: [place] [facility];  Service: Cardiovascular;  Laterality: N/A;   HERNIA REPAIR  ~ 2001   "abdominal w/mesh implanted"   LEFT HEART CATH AND CORONARY ANGIOGRAPHY N/A 08/10/2016   Procedure: Left Heart Cath and Coronary Angiography;  Surgeon: [name], MD;  Location: [place] [facility];  Service: Cardiovascular;  Laterality: N/A;   LEFT HEART CATHETERIZATION WITH CORONARY ANGIOGRAM N/A 12/16/2011   Procedure: LEFT HEART CATHETERIZATION WITH CORONARY ANGIOGRAM;  Surgeon: Minus Breeding, MD;  Location: [facility] CATH LAB;  Service: Cardiovascular;  Laterality: N/A;   PERCUTANEOUS CORONARY STENT INTERVENTION (PCI-S) N/A 12/17/2011   Procedure: PERCUTANEOUS CORONARY STENT INTERVENTION  (PCI-S);  Surgeon: Sherren Mocha, MD;  Location: [facility] CATH LAB;  Service: Cardiovascular;  Laterality: N/A;   TONSILLECTOMY     "I was a kid"     FAMILY HISTORY   Family History  Family history unknown: Yes     SOCIAL HISTORY   Social History   Tobacco Use   Smoking status: Former    Packs/day: 0.50    Years: 0.50    Pack years: 0.25    Types: Cigarettes   Smokeless tobacco: Never   Tobacco comments:    11/23/16 Still not ready to quit smoking.  Substance Use Topics   Alcohol use: Yes    Alcohol/week: 1.0 standard drink    Types: 1 Cans of beer per week    Comment: 12/14/2011 "if my kidneys are sore, I drink 1 beer/day; if not sore; no beer; occasionally drink socially; ave 1 beer/wk maybe"   Drug use: No     MEDICATIONS    Home Medication:     Current Medication:  Current Facility-Administered Medications:    0.9 %  sodium chloride infusion, 250 mL, Intravenous, PRN, Mansy, Jan A, MD   acetaminophen (TYLENOL) tablet 650 mg, 650 mg, Oral, Q4H PRN, Mansy, Jan A, MD   ALPRAZolam Duanne Moron) tablet 0.25 mg, 0.25 mg, Oral, BID PRN, Mansy, Jan A, MD, 0.25 mg at 05/01/21 2159   amLODipine (NORVASC) tablet 10 mg, 10 mg, Oral, Daily, Regalado, Belkys A, MD, 10 mg at 05/02/21 0818   atorvastatin (LIPITOR) tablet 40 mg, 40 mg, Oral, q1800, Mansy, Jan A, MD, 40 mg at 05/01/21 1808   azithromycin (ZITHROMAX) tablet 250 mg, 250 mg, Oral, Daily, Sreshta Cressler, MD, 250 mg at 05/02/21 0818   carvedilol (COREG) tablet 6.25 mg, 6.25 mg, Oral, BID WC, Mansy, Jan A, MD, 6.25 mg at 05/02/21 0818   clobetasol cream (TEMOVATE) 0.05 %, , Topical, BID, Mansy, Jan A, MD   clopidogrel (PLAVIX) tablet 75 mg, 75 mg, Oral, Daily, Mansy, Jan A, MD, 75 mg at 05/02/21 0818   enoxaparin (LOVENOX) injection 30 mg, 30 mg, Subcutaneous, Q24H, Mansy, Jan A, MD, 30 mg at 05/01/21 2200   finasteride (PROSCAR) tablet 5 mg, 5 mg, Oral, Daily, Mansy, Jan A, MD, 5 mg at 05/02/21 0818   furosemide (LASIX) tablet  40 mg, 40 mg, Oral, Daily, Val Riles, MD, 40 mg at 05/02/21 0818   guaiFENesin (MUCINEX) 12 hr tablet 600 mg, 600 mg, Oral, BID, Val Riles, MD, 600 mg at 05/02/21 0818   hydrALAZINE (APRESOLINE) tablet 100 mg, 100 mg, Oral, Q8H, Mansy, Jan A, MD, 100 mg at 05/02/21 0540   ipratropium-albuterol (DUONEB) 0.5-2.5 (3) MG/3ML nebulizer solution 3 mL, 3 mL, Nebulization, Q6H PRN, Lanney Gins, Macario Shear, MD, 3 mL at 04/29/21 1311   isosorbide mononitrate (IMDUR) 24 hr tablet 60 mg, 60 mg, Oral,  Daily, Regalado, Belkys A, MD, 60 mg at 05/02/21 0818   labetalol (NORMODYNE) injection 20 mg, 20 mg, Intravenous, Q3H PRN, Mansy, Jan A, MD, 20 mg at 05/01/21 0650   loratadine (CLARITIN) tablet 10 mg, 10 mg, Oral, Daily, Mansy, Jan A, MD, 10 mg at 05/02/21 0818   MEDLINE mouth rinse, 15 mL, Mouth Rinse, BID, Mansy, Jan A, MD, 15 mL at 04/30/21 2109   nitroGLYCERIN (NITROGLYN) 2 % ointment 1 inch, 1 inch, Topical, Q6H PRN, Mansy, Jan A, MD, 1 inch at 04/27/21 2300   ondansetron (ZOFRAN) injection 4 mg, 4 mg, Intravenous, Q6H PRN, Mansy, Jan A, MD   pantoprazole (PROTONIX) EC tablet 40 mg, 40 mg, Oral, Daily, Mansy, Jan A, MD, 40 mg at 05/02/21 0818   predniSONE (DELTASONE) tablet 10 mg, 10 mg, Oral, Q breakfast, Lanney Gins, Orella Cushman, MD, 10 mg at 05/02/21 0818   sodium chloride flush (NS) 0.9 % injection 3 mL, 3 mL, Intravenous, Q12H, Mansy, Jan A, MD, 3 mL at 05/02/21 0820   sodium chloride flush (NS) 0.9 % injection 3 mL, 3 mL, Intravenous, PRN, Mansy, Jan A, MD   tamsulosin [facility]) capsule 0.4 mg, 0.4 mg, Oral, Daily, Mansy, Jan A, MD, 0.4 mg at 05/02/21 0818   vitamin B-12 (CYANOCOBALAMIN) tablet 1,000 mcg, 1,000 mcg, Oral, Daily, Mansy, Jan A, MD, 1,000 mcg at 05/02/21 6010   Vitamin D (Ergocalciferol) (DRISDOL) capsule 50,000 Units, 50,000 Units, Oral, Q7 days, Val Riles, MD, 50,000 Units at 05/01/21 1007    ALLERGIES   Iodinated contrast media and Iodinated glycerol  [glycerol,  iodinated]     REVIEW OF SYSTEMS    Review of Systems:  Gen:  Denies  fever, sweats, chills weigh loss  HEENT: Denies blurred vision, double vision, ear pain, eye pain, hearing loss, nose bleeds, sore throat Cardiac:  No dizziness, chest pain or heaviness, chest tightness,edema Resp:   Denies cough or sputum porduction, shortness of breath,wheezing, hemoptysis,  Gi: Denies swallowing difficulty, stomach pain, nausea or vomiting, diarrhea, constipation, bowel incontinence Gu:  Denies bladder incontinence, burning urine Ext:   Denies Joint pain, stiffness or swelling Skin: Denies  skin rash, easy bruising or bleeding or hives Endoc:  Denies polyuria, polydipsia , polyphagia or weight change Psych:   Denies depression, insomnia or hallucinations   Other:  All other systems negative   VS: BP (!) 156/58 (BP Location: Right Arm)    Pulse (!) 59    Temp 97.6 F (36.4 C) (Oral)    Resp 14    Ht 5' 10.98" (1.803 m)    Wt 96.1 kg    SpO2 94%    BMI 29.55 kg/m      PHYSICAL EXAM    GENERAL:NAD, no fevers, chills, no weakness no fatigue HEAD: Normocephalic, atraumatic.  EYES: Pupils equal, round, reactive to light. Extraocular muscles intact. No scleral icterus.  MOUTH: Moist mucosal membrane. Dentition intact. No abscess noted.  EAR, NOSE, THROAT: Clear without exudates. No external lesions.  NECK: Supple. No thyromegaly. No nodules. No JVD.  PULMONARY: mild ronchi bilaterally  CARDIOVASCULAR: S1 and S2. Regular rate and rhythm. No murmurs, rubs, or gallops. No edema. Pedal pulses 2+ bilaterally.  GASTROINTESTINAL: Soft, nontender, nondistended. No masses. Positive bowel sounds. No hepatosplenomegaly.  MUSCULOSKELETAL: No swelling, clubbing, or edema. Range of motion full in all extremities.  NEUROLOGIC: Cranial nerves II through XII are intact. No gross focal neurological deficits. Sensation intact. Reflexes intact.  SKIN: No ulceration, lesions, rashes, or cyanosis. Skin warm and  dry. Turgor intact.  PSYCHIATRIC: Mood, affect within normal limits. The patient is awake, alert and oriented x 3. Insight, judgment intact.       IMAGING    CT ABDOMEN PELVIS WO CONTRAST  Result Date: 04/04/2021 CLINICAL DATA:  Retroperitoneal hemorrhage suspected. EXAM: CT ABDOMEN AND PELVIS WITHOUT CONTRAST TECHNIQUE: Multidetector CT imaging of the abdomen and pelvis was performed following the standard protocol without IV contrast. COMPARISON:  01/29/2016 FINDINGS: Lower chest: Bibasilar atelectasis noted with tiny bilateral pleural effusions. Hepatobiliary: No focal abnormality in the liver on this study without intravenous contrast. Layering tiny gallstones evident. No intrahepatic or extrahepatic biliary dilation. Pancreas: No focal mass lesion. No dilatation of the main duct. No intraparenchymal cyst. No peripancreatic edema. Spleen: No splenomegaly. No focal mass lesion. Adrenals/Urinary Tract: No adrenal nodule or mass. Small cyst noted upper pole left kidney. No evidence for hydroureter. The urinary bladder appears normal for the degree of distention. Stomach/Bowel: Stomach is unremarkable. No gastric wall thickening. No evidence of outlet obstruction. Duodenum is normally positioned as is the ligament of Treitz. No small bowel wall thickening. No small bowel dilatation. The terminal ileum is normal. The appendix is normal. No gross colonic mass. No colonic wall thickening. Diverticular changes are noted in the left colon without evidence of diverticulitis. 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 Low-attenuation left para-aortic lymph nodes (image 32/2) are stable since 2017 consistent with benign etiology. No pelvic sidewall lymphadenopathy. Reproductive: The prostate gland and seminal vesicles are unremarkable. Other: No intraperitoneal free fluid. Musculoskeletal: Multiple supraumbilical ventral  hernias identified, containing only fat. Stable bone island left anterior pubic ramus. No worrisome lytic or sclerotic osseous abnormality. IMPRESSION: 1. No acute findings in the abdomen or pelvis. Specifically, no findings to suggests retroperitoneal hemorrhage. 2. 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 3. No change in the left para-aortic lymphadenopathy consistent with benign/reactive etiology. 4. Cholelithiasis. 5. Multiple supraumbilical ventral hernias containing only fat. Electronically Signed   By: Misty Stanley M.D.   On: 04/04/2021 07:36   DG Chest 2 View  Result Date: 04/27/2021 CLINICAL DATA:  Chest pain. EXAM: CHEST - 2 VIEW COMPARISON:  None. FINDINGS: Stable position of cardiac pacemaker.  Aortic stent graft noted. Tortuosity and calcific atherosclerotic disease of the aorta. Cardiomediastinal silhouette is normal. Mediastinal contours appear intact. There is no evidence of focal airspace consolidation, pleural effusion or pneumothorax. Probable bilateral small pleural effusions. Osseous structures are without acute abnormality. Soft tissues are grossly normal. IMPRESSION: Probable bilateral small pleural effusions. Electronically Signed   By: Fidela Salisbury M.D.   On: 04/27/2021 13:45   DG Chest 2 View  Result Date: 04/11/2021 CLINICAL DATA:  Shortness of breath EXAM: CHEST - 2 VIEW COMPARISON:  04/03/2021 FINDINGS: No focal consolidation. No pleural effusion or pneumothorax. Heart and mediastinal contours are unremarkable. Dual lead cardiac pacemaker. No acute osseous abnormality. IMPRESSION: No active cardiopulmonary disease. Electronically Signed   By: Kathreen Devoid M.D.   On: 04/11/2021 15:11   DG Chest 2 View  Result Date: 04/03/2021 CLINICAL DATA:  sob EXAM: CHEST - 2 VIEW COMPARISON:  Chest x-ray 09/07/2016, CT chest 12/14/2011. FINDINGS: The heart and mediastinal contours are unchanged. Coronary  artery stent. Aortic calcification. The distal descending thoracic aorta/suprarenal abdominal aorta stent noted on lateral view. Three lead cardiac pacemaker/defibrillator overlies the left chest wall. No focal consolidation. No pulmonary edema.  Blunting of bilateral costophrenic angles. No pneumothorax. No acute osseous abnormality. IMPRESSION: Blunting of bilateral costophrenic angles. Trace pleural effusions not excluded. Electronically Signed   By: Iven Finn M.D.   On: 04/03/2021 22:56   CT CHEST WO CONTRAST  Result Date: 04/12/2021 CLINICAL DATA:  Shortness of breath. EXAM: CT CHEST WITHOUT CONTRAST TECHNIQUE: Multidetector CT imaging of the chest was performed following the standard protocol without IV contrast. COMPARISON:  December 14, 2011 FINDINGS: Cardiovascular: A dual lead AICD is in place. There is moderate to marked severity calcification of the aortic arch and descending thoracic aorta. 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 A stent is noted within the distal aspect of the descending thoracic aorta. This extends to include the visualized portion of the abdominal aorta. Normal heart size with coronary artery stents in place. No pericardial effusion. Mediastinum/Nodes: There is mild AP window and pretracheal lymphadenopathy. Thyroid gland, trachea, and esophagus demonstrate no significant findings. Lungs/Pleura: There is mild emphysematous lung disease involving the bilateral upper lobes. Mild areas of linear scarring and/or atelectasis are noted within the posterior aspects of the bilateral lung bases. There are very small bilateral pleural effusions. No pneumothorax is identified. Upper Abdomen: A thin layer of tiny gallstones is seen within the dependent portion of the gallbladder. Vascular stents are seen within the origins of the celiac artery, superior mesenteric artery and bilateral renal arteries. Musculoskeletal: Multilevel degenerative changes seen throughout  the thoracic spine. IMPRESSION: 1. Mild emphysematous lung disease. 2. Mild bibasilar linear scarring and/or atelectasis. 3. Very small bilateral pleural effusions. 4. Extensive stenting of the distal descending thoracic aorta, as well as the abdominal aorta and several of its branches. 5. Cholelithiasis. Aortic Atherosclerosis (ICD10-I70.0) and Emphysema (ICD10-J43.9). Electronically Signed   By: Virgina Norfolk M.D.   On: 04/12/2021 04:11   NM Pulmonary Perfusion  Result Date: 05/01/2021 CLINICAL DATA:  Shortness of breath EXAM: NUCLEAR MEDICINE PERFUSION LUNG SCAN TECHNIQUE: Perfusion images were obtained in multiple projections after intravenous injection of radiopharmaceutical. Ventilation scans intentionally deferred if perfusion scan and chest x-ray adequate for interpretation during COVID 19 epidemic. RADIOPHARMACEUTICALS:  4.1 mCi Tc-26m MAA IV COMPARISON:  Same day chest radiograph FINDINGS: Heterogeneous perfusion of the lung with multiple small subsegmental perfusion defects. IMPRESSION: Nondiagnostic (low or intermediate probability) Electronically Signed   By: Yetta Glassman M.D.   On: 05/01/2021 14:48   NM Pulmonary Perfusion  Result Date: 04/12/2021 CLINICAL DATA:  Evaluate for pulmonary embolus. Shortness of breath. EXAM: NUCLEAR MEDICINE PERFUSION LUNG SCAN TECHNIQUE: Perfusion images were obtained in multiple projections after intravenous injection of radiopharmaceutical. Ventilation scans intentionally deferred if perfusion scan and chest x-ray adequate for interpretation during COVID 19 epidemic. RADIOPHARMACEUTICALS:  4.3 mCi Tc-74m MAA IV COMPARISON:  Chest radiograph 04/11/21 FINDINGS: On the chest radiograph from 04/11/2021 there is a ICD overlying the left chest wall. No focal pulmonary opacities identified. On the perfusion portion of the examination there is a focal peripheral defect overlying the left upper lobe corresponding to the ICD battery pack. Mild heterogeneous  distribution of the radiopharmaceutical is noted within both lungs. On the lateral projection images there are large nonsegmental areas of diminished activity within both posterior and upper lung zones which likely represents non-uniform soft tissue attenuation artifact secondary to patient's upper extremities. No peripheral segmental perfusion defects to suggest acute pulmonary embolus. IMPRESSION: 1. No evidence for acute pulmonary embolus. Electronically Signed   By: Kerby Moors M.D.   On: 04/12/2021 11:01  US RENAL  Result Date: 04/08/2021 CLINICAL DATA:  Renal dysfunction. EXAM: RENAL / URINARY TRACT ULTRASOUND COMPLETE COMPARISON:  Abdominal sonogram done on 06/20/2015 FINDINGS: Right Kidney: Renal measurements: 10.9 x 5.3 x 5 cm = volume: 152 mL. There is no hydronephrosis. There is increased cortical echogenicity. Left Kidney: Renal measurements: 11.2 x 5.5 x 4.5 cm = volume: 147 mL. There is no hydronephrosis. There is increased cortical echogenicity. There is 2 cm cyst in the upper pole. Bladder: Appears normal for degree of bladder distention. Other: None. IMPRESSION: There is no hydronephrosis. Increased cortical echogenicity suggests medical renal disease. 2 cm left renal cyst. Electronically Signed   By: Elmer Picker M.D.   On: 04/08/2021 16:11   US Venous Img Lower Bilateral  Result Date: 04/04/2021 CLINICAL DATA:  Bilateral lower extremity edema, dyspnea EXAM: BILATERAL LOWER EXTREMITY VENOUS DOPPLER ULTRASOUND TECHNIQUE: Gray-scale sonography with compression, as well as color and duplex ultrasound, were performed to evaluate the deep venous system(s) from the level of the common femoral vein through the popliteal and proximal calf veins. COMPARISON:  None. FINDINGS: VENOUS Normal compressibility of the common femoral, superficial femoral, and popliteal veins, as well as the visualized calf veins. Visualized portions of profunda femoral vein and great saphenous vein  unremarkable. No filling defects to suggest DVT on grayscale or color Doppler imaging. Doppler waveforms show normal direction of venous flow, normal respiratory plasticity and response to augmentation. OTHER None. Limitations: none IMPRESSION: Negative. Electronically Signed   By: Fidela Salisbury M.D.   On: 04/04/2021 03:32   US Venous Img Upper Uni Left  Result Date: 04/11/2021 CLINICAL DATA:  Left upper extremity pain and swelling. EXAM: LEFT UPPER EXTREMITY VENOUS DOPPLER ULTRASOUND TECHNIQUE: Gray-scale sonography with graded compression, as well as color Doppler and duplex ultrasound were performed to evaluate the upper extremity deep venous system from the level of the subclavian vein and including the jugular, axillary, basilic, radial, ulnar and upper cephalic vein. Spectral Doppler was utilized to evaluate flow at rest and with distal augmentation maneuvers. COMPARISON:  None. FINDINGS: Contralateral Subclavian Vein: Respiratory phasicity is normal and symmetric with the symptomatic side. No evidence of thrombus. Normal compressibility. Internal Jugular Vein: No evidence of thrombus. Normal compressibility, respiratory phasicity and response to augmentation. Subclavian Vein: No evidence of thrombus. Normal compressibility, respiratory phasicity and response to augmentation. Axillary Vein: No evidence of thrombus. Normal compressibility, respiratory phasicity and response to augmentation. Cephalic Vein: No evidence of thrombus. Normal compressibility, respiratory phasicity and response to augmentation. Basilic Vein: No evidence of thrombus. Normal compressibility, respiratory phasicity and response to augmentation. Brachial Veins: No evidence of thrombus. Normal compressibility, respiratory phasicity and response to augmentation. Radial Veins: No evidence of thrombus. Normal compressibility, respiratory phasicity and response to augmentation. Ulnar Veins: No evidence of thrombus. Normal compressibility,  respiratory phasicity and response to augmentation. Venous Reflux:  None visualized. Other Findings:  None visualized. IMPRESSION: No evidence of DVT within the LEFT upper extremity. Electronically Signed   By: Virgina Norfolk M.D.   On: 04/11/2021 20:25   DG Chest Port 1 View  Result Date: 05/01/2021 CLINICAL DATA:  Shortness of breath EXAM: PORTABLE CHEST 1 VIEW COMPARISON:  Previous studies including the examination of 04/27/2021 FINDINGS: Transverse diameter of heart is increased. Pacemaker battery is seen in the left infraclavicular region. Biventricular pacer leads are noted in place. Central pulmonary vessels are more prominent. Subtle increase in interstitial markings are seen in the lower lung fields, more so on the right side. Costophrenic  angles are clear. There is no pneumothorax. IMPRESSION: Cardiomegaly. Central pulmonary vessels are prominent without signs of alveolar pulmonary edema. Increased interstitial markings are seen in the both lower lung fields, more so on the right side which may suggest mild asymmetric interstitial pulmonary edema or interstitial pneumonia. Electronically Signed   By: Elmer Picker M.D.   On: 05/01/2021 13:30   ECHOCARDIOGRAM COMPLETE  Result Date: 04/06/2021    ECHOCARDIOGRAM REPORT   Patient Name:   Peter [name] Date of Exam: 04/04/2021 Medical Rec #:  [id number]        Height:       70.0 in Accession #:    [id number]       Weight:       220.0 lb Date of Birth:  Dec 13, 1940       BSA:          2.174 m Patient Age:    81 years         BP:           198/65 mmHg Patient Gender: M                HR:           89 bpm. Exam Location:  [facility] Procedure: 2D Echo, Cardiac Doppler and Color Doppler Indications:     Elevated Troponin  History:         Patient has prior history of Echocardiogram examinations, most                  recent 08/08/2016. CHF, Previous Myocardial Infarction; Risk                  Factors:Hypertension.  Sonographer:     Sherrie Sport  Referring Phys:  [id number] AGBATA Diagnosing Phys: Yolonda Kida MD  Sonographer Comments: Suboptimal parasternal window. IMPRESSIONS  1. Left ventricular ejection fraction, by estimation, is 50 to 55%. The left ventricle has low normal function. The left ventricle has no regional wall motion abnormalities. The left ventricular internal cavity size was mildly dilated. There is mild left ventricular hypertrophy. Left ventricular diastolic parameters are consistent with Grade II diastolic dysfunction (pseudonormalization).  2. Right ventricular systolic function is low normal. The right ventricular size is moderately enlarged.  3. The mitral valve is normal in structure. Mild mitral valve regurgitation.  4. The aortic valve is normal in structure. Aortic valve regurgitation is mild. FINDINGS  Left Ventricle: Left ventricular ejection fraction, by estimation, is 50 to 55%. The left ventricle has low normal function. The left ventricle has no regional wall motion abnormalities. The left ventricular internal cavity size was mildly dilated. There is mild left ventricular hypertrophy. Left ventricular diastolic parameters are consistent with Grade II diastolic dysfunction (pseudonormalization). Right Ventricle: The right ventricular size is moderately enlarged. No increase in right ventricular wall thickness. Right ventricular systolic function is low normal. Left Atrium: Left atrial size was normal in size. Right Atrium: Right atrial size was normal in size. Pericardium: There is no evidence of pericardial effusion. Mitral Valve: The mitral valve is normal in structure. Mild mitral valve regurgitation. Tricuspid Valve: The tricuspid valve is normal in structure. Tricuspid valve regurgitation is mild. Aortic Valve: The aortic valve is normal in structure. Aortic valve regurgitation is mild. Aortic valve mean gradient measures 4.0 mmHg. Aortic valve peak gradient measures 6.7 mmHg. Aortic valve area, by VTI  measures 3.09 cm. Pulmonic Valve: The pulmonic valve was normal in structure. Pulmonic valve regurgitation is  not visualized. Aorta: The ascending aorta was not well visualized. IAS/Shunts: No atrial level shunt detected by color flow Doppler. Additional Comments: A device lead is visualized. There is no pleural effusion.  LEFT VENTRICLE PLAX 2D LVIDd:         4.90 cm   Diastology LVIDs:         3.30 cm   LV e' medial:    7.07 cm/s LV PW:         1.50 cm   LV E/e' medial:  14.0 LV IVS:        1.05 cm   LV e' lateral:   8.27 cm/s LVOT diam:     2.10 cm   LV E/e' lateral: 12.0 LV SV:         86 LV SV Index:   40 LVOT Area:     3.46 cm  RIGHT VENTRICLE RV Basal diam:  4.40 cm RV S prime:     14.40 cm/s TAPSE (M-mode): 4.7 cm LEFT ATRIUM           Index        RIGHT ATRIUM           Index LA diam:      2.90 cm 1.33 cm/m   RA Area:     27.00 cm LA Vol (A2C): 45.6 ml 20.98 ml/m  RA Volume:   85.90 ml  39.52 ml/m LA Vol (A4C): 56.1 ml 25.81 ml/m  AORTIC VALVE                    PULMONIC VALVE AV Area (Vmax):    3.01 cm     PV Vmax:        0.70 m/s AV Area (Vmean):   3.08 cm     PV Vmean:       44.100 cm/s AV Area (VTI):     3.09 cm     PV VTI:         0.107 m AV Vmax:           129.00 cm/s  PV Peak grad:   1.9 mmHg AV Vmean:          88.400 cm/s  PV Mean grad:   1.0 mmHg AV VTI:            0.279 m      RVOT Peak grad: 3 mmHg AV Peak Grad:      6.7 mmHg AV Mean Grad:      4.0 mmHg LVOT Vmax:         112.00 cm/s LVOT Vmean:        78.700 cm/s LVOT VTI:          0.249 m LVOT/AV VTI ratio: 0.89  AORTA Ao Root diam: 3.40 cm MITRAL VALVE               TRICUSPID VALVE MV Area (PHT): 4.83 cm    TR Peak grad:   40.4 mmHg MV Decel Time: 157 msec    TR Vmax:        318.00 cm/s MV E velocity: 99.00 cm/s MV A velocity: 87.00 cm/s  SHUNTS MV E/A ratio:  1.14        Systemic VTI:  0.25 m                            Systemic Diam: 2.10 cm  Pulmonic VTI:  0.149 m Yolonda Kida MD Electronically  signed by Yolonda Kida MD Signature Date/Time: 04/06/2021/11:32:38 AM    Final       ASSESSMENT/PLAN   Acute on chronic hypoxemic respiratory failure   - Patient is a lifelong smoker, we discussed smoking cessation    -I spoke with [name] his daughter and she reports substantial anxiety due to air hunger and breathlessness  - Patient has acute on chronic CHF with 3rd re-admission  -Acute renal failure - nephrology on case appreciate input -We discussed COPD and outpatient follow up     Acute exacerbation of COPD  With bronchiectasis and mild mucus plugging bilaterally per CT Chest  - COPD care path  - incentive spirometry and flutter valve  - PT/OT-patient   - prednisone 10mg   - duoneb - [id number]  - zithromax 250 po daliy    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      - he had hematuria for some time after this infection     - nephrology on case appreciate input    Thank you for allowing me to participate in the care of this patient.  Total face to face encounter time for this patient visit was >45 min. >50% of the time was  spent in counseling and coordination of care.   Patient/Family are satisfied with care plan and all questions have been answered.  This document was prepared using Dragon voice recognition software and may include unintentional dictation errors.     Ottie Glazier, M.D.  Division of [facility]

## 2021-05-02 NOTE — Progress Notes (Signed)
° °  Heart Failure Nurse Navigator Note.   Met with patient this morning he was sitting on the edge of the bed in no acute distress, wondering if he was going to get to go home.  Explained to him that I had information about a new program that we would like to enroll him in, he asked that [name] also listen on the phone while we talked.  Explained to him and [name] that [facility] has a new program for heart failure patients with following daily  and keeping them out of the hospital and that was called [facility].  Explained to them that it was at no cost to them that it was covered by insurance.  Explained that they would have frequent visits with a cardiologist ( the visits are virtual)and devices at home which include a scale and blood pressure cuff and EKG device for monitoring them daily.  They would also have a nurse at their disposable to talk to them regularly. This program does not take away from their primary cardiologist, they will still perform heart caths, echos etc.  Patient and his daughter [name] both are in agreement that they are in favor of this program.  Explained that I am waiting to get the registration forms that we would get that sent in and that the devices would be sent to him at home.  Has a cell phone and email address.  Son-in-law was given written information about ventricle health.  Pricilla Riffle RN [facility]

## 2021-05-02 NOTE — Discharge Summary (Signed)
[facility] Discharge Summary   Patient: Peter [name] MHW:[id number]  PCP: Tracie Harrier, MD  Principal Problem:   Acute exacerbation of CHF (congestive heart failure) ([place])   Admitted From: Home Disposition:  Home   Recommendations for Outpatient Follow-up:  PCP: In 1 week Nephrologist in 1 week, repeat BMP after 1 week Cardiologist in 1 week Pulmonologist in 1 week Follow up LABS/TEST: BMP in 1 week to check renal functions   Diet recommendation: Cardiac diet  Activity: The patient is advised to gradually reintroduce usual activities, as tolerated  Discharge Condition: stable  Code Status: Full code   History of present illness: As per the H and P dictated on admission Hospital Course:  81 year old with past medical history significant for systolic and diastolic heart failure, CAD, hypertension, dyslipidemia, non-STEMI status post PCI on steroids, status post AICD who presents to the ED with acute onset of worsening shortness of breath.  He describes orthopnea, paroxysmal nocturnal dyspnea, exertional dyspnea.  Recent admission 12/22 until 12/28 for symptomatic anemia, received blood transfusion, treatment for heart failure exacerbation with diuresis.  His Lasix was discontinued at discharge due to AKI on CKD stage IV.  He has not been taking Lasix since her last hospitalization.  AICD was interrogated by ER physician and show increased PVCs. ED w/up: SBP 197, RR 25, BUN 40, creatinine 2.8 prior creatinine 3.2 on 9/9.  Last admission creatinine at discharge was 3.1.  BNP elevated 2988.  Troponin mildly elevated to 253--- 259. Chest x-ray showed bilateral small pleural effusion. Patient admitted for further evaluation and treatment of heart failure exacerbation.   Assessment and Plan: Principal Problem:   Acute exacerbation of CHF (congestive heart failure) (H[facility])   Acute on chronic diastolic heart failure exacerbation: His systolic heart failure has resolved  since 2D echo 04/04/2021.  Last ejection fraction 50 to 55%. -s/p IV Lasix, switch to Lasix 40 mg p.o. twice daily as per cardiology, discussed with nephrology, plan is to discharge patient on Lasix 40 mg daily. Hold spironolactone and ACE due to CKD. Appreciate cardiology and nephrology assistance. Dyspnea improved, but patient is a still worried to go home without oxygen. VQ scan: Heterogeneous perfusion of the lung with multiple small subsegmental perfusion defects. IMPRESSION: Nondiagnostic (low or intermediate probability).  Patient was discharged on Lasix 40 mg daily.  Due to recurrent admission of hypoxia at home, oxygen was arranged for home use on patient's request.  Patient does not want to go to the hospital again due to low oxygen at home.  Pulmonologist agreed with the plan and oxygen was arranged.  Hypertensive urgency: Continue with Coreg, hydralazine- increased Imdur dose.  Started amlodipine, resumed Aldactone on discharge, held Cozaar for now due to CKD stage IV.  Patient was advised to monitor BP at home and follow with PCP, cardiology and nephrology to titrate medications accordingly. Elevation of troponin: In the setting of heart failure exacerbation.  Likely demand ischemia. Continue Imdur Coreg. He was a started on a baby aspirin. Cardiology  consulted, recommended no further work-up AKI on CKD stage IV; Baseline cr 2.8. Peak to 3.67, Concern for cardiorenal syndrome.  S/p IV lasix, switch to Lasix 40 mg p.o. twice daily on 1/18, Nephrology consulted. Cr  2.8---3.67--3.77 remained elevated.  Patient was recommended to follow with nephrology in 1 week as an outpatient and repeat BMP to check renal functions. Multifocal PVCs: Status post AICD.  Suspicious AICD discharge night of admission. Cardiology consulted, Received IV magnesium, no additional work-up recommended.  Hyperlipidemia: Continue with a statin BPH: Continue with Proscar and Flomax Mild emphysematous lung disease, seen on  CT scan last admission, He was Supposed to have appointment office today with pulmonologist. S/p DuoNebs. Dr. Keenan Bachelor recommend PRN duoneb, Low dose prednisone, azithromycin, which were given during hospital stay, no need on discharge.  Oxygen was arranged for home use. Vitamin D insufficiency, started vitamin D 50,000 units p.o. weekly, follow with PCP and repeat vitamin D level after 3 months. Body mass index is 29.55 kg/m.  Nutrition Interventions:   Patient was ambulatory without any assistance. On the day of the discharge the patient's vitals were stable, and no other acute medical condition were reported by patient. the patient was felt safe to be discharge at Home.  Consultants: Cardiologist, nephrologist, pulmonologis Procedures: None  Discharge Exam: General: Appear in no distress, no Rash; Oral Mucosa Clear, moist. Cardiovascular: S1 and S2 Present, no Murmur, Respiratory: normal respiratory effort, Bilateral Air entry present and no Crackles, no wheezes Abdomen: Bowel Sound present, Soft and no tenderness, no hernia Extremities: no Pedal edema, no calf tenderness Neurology: alert and oriented to time, place, and person affect appropriate.  Filed Weights   04/30/21 0507 05/01/21 0416 05/02/21 0500  Weight: 93.3 kg 91.6 kg 96.1 kg   Vitals:   05/02/21 0500 05/02/21 0810  BP: (!) 155/71 (!) 156/58  Pulse: 62 (!) 59  Resp: 18 14  Temp: 98.4 F (36.9 C) 97.6 F (36.4 C)  SpO2: 94% 94%    DISCHARGE MEDICATION: Allergies as of 05/02/2021       Reactions   Iodinated Contrast Media Shortness Of Breath   Iodinated Glycerol  [glycerol, Iodinated] Shortness Of Breath        Medication List     STOP taking these medications    losartan 100 MG tablet Commonly known as: COZAAR       TAKE these medications    amLODipine 10 MG tablet Commonly known as: NORVASC Take 1 tablet (10 mg total) by mouth daily.   atorvastatin 40 MG tablet Commonly known as:  LIPITOR Take 1 tablet (40 mg total) by mouth daily at 6 PM.   calcitRIOL 0.25 MCG capsule Commonly known as: ROCALTROL Take 0.25 mcg by mouth daily.   carvedilol 6.25 MG tablet Commonly known as: COREG Take 6.25 mg by mouth 2 (two) times daily with a meal.   cetirizine 10 MG tablet Commonly known as: ZYRTEC Take 10 mg by mouth.   clobetasol cream 0.05 % Commonly known as: TEMOVATE Apply 1 application topically 2 (two) times daily as needed (psoriasis).   clopidogrel 75 MG tablet Commonly known as: PLAVIX TAKE 1 TABLET(75 MG) BY MOUTH EVERY DAY   cyanocobalamin 1000 MCG tablet Take 1,000 mcg by mouth daily.   finasteride 5 MG tablet Commonly known as: PROSCAR Take 1 tablet (5 mg total) by mouth daily.   furosemide 40 MG tablet Commonly known as: LASIX Take 1 tablet (40 mg total) by mouth daily. What changed:  medication strength how much to take how to take this when to take this additional instructions   GLUCOSAMINE-CHONDROITIN-VIT C PO Take 1 tablet by mouth daily.   hydrALAZINE 100 MG tablet Commonly known as: APRESOLINE Take 1 tablet (100 mg total) by mouth every 8 (eight) hours. What changed: when to take this   isosorbide mononitrate 60 MG 24 hr tablet Commonly known as: IMDUR Take 1 tablet (60 mg total) by mouth daily. What changed:  medication strength how much to take  omeprazole 20 MG capsule Commonly known as: PRILOSEC Take 20 mg by mouth in the morning.   spironolactone 25 MG tablet Commonly known as: ALDACTONE Hold until followup with your outpatient doctor due to worsening kidney function.   tamsulosin 0.4 MG Caps capsule Commonly known as: FLOMAX Take 1 capsule (0.4 mg total) by mouth daily.       Allergies  Allergen Reactions   Iodinated Contrast Media Shortness Of Breath   Iodinated Glycerol  [Glycerol, Iodinated] Shortness Of Breath   Discharge Instructions     Call MD for:  difficulty breathing, headache or visual  disturbances   Complete by: As directed    Call MD for:  extreme fatigue   Complete by: As directed    Call MD for:  persistant dizziness or light-headedness   Complete by: As directed    Call MD for:  persistant nausea and vomiting   Complete by: As directed    Call MD for:  severe uncontrolled pain   Complete by: As directed    Call MD for:  temperature >100.4   Complete by: As directed    Diet - low sodium heart healthy   Complete by: As directed    Discharge instructions   Complete by: As directed    Follow-up with PCP in 1 week, monitor BP at home and follow with PCP to titrate medications accordingly.  Repeat BMP after 1 week Follow-up with nephrology in 1 week Follow with pulmonologist in 1 to 2 weeks Follow with cardiologist in 1 to 2 weeks   Increase activity slowly   Complete by: As directed        The results of significant diagnostics from this hospitalization (including imaging, microbiology, ancillary and laboratory) are listed below for reference.    Significant Diagnostic Studies: DG Chest 2 View  Result Date: 04/27/2021 CLINICAL DATA:  Chest pain. EXAM: CHEST - 2 VIEW COMPARISON:  None. FINDINGS: Stable position of cardiac pacemaker.  Aortic stent graft noted. Tortuosity and calcific atherosclerotic disease of the aorta. Cardiomediastinal silhouette is normal. Mediastinal contours appear intact. There is no evidence of focal airspace consolidation, pleural effusion or pneumothorax. Probable bilateral small pleural effusions. Osseous structures are without acute abnormality. Soft tissues are grossly normal. IMPRESSION: Probable bilateral small pleural effusions. Electronically Signed   By: Fidela Salisbury M.D.   On: 04/27/2021 13:45   DG Chest 2 View  Result Date: 04/11/2021 CLINICAL DATA:  Shortness of breath EXAM: CHEST - 2 VIEW COMPARISON:  04/03/2021 FINDINGS: No focal consolidation. No pleural effusion or pneumothorax. Heart and mediastinal contours are  unremarkable. Dual lead cardiac pacemaker. No acute osseous abnormality. IMPRESSION: No active cardiopulmonary disease. Electronically Signed   By: Kathreen Devoid M.D.   On: 04/11/2021 15:11   CT CHEST WO CONTRAST  Result Date: 04/12/2021 CLINICAL DATA:  Shortness of breath. EXAM: CT CHEST WITHOUT CONTRAST TECHNIQUE: Multidetector CT imaging of the chest was performed following the standard protocol without IV contrast. COMPARISON:  December 14, 2011 FINDINGS: Cardiovascular: A dual lead AICD is in place. There is moderate to marked severity calcification of the aortic arch and descending thoracic aorta. 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 A stent is noted within the distal aspect of the descending thoracic aorta. This extends to include the visualized portion of the abdominal aorta. Normal heart size with coronary artery stents in place. No pericardial effusion. Mediastinum/Nodes: There is mild AP window and pretracheal lymphadenopathy. Thyroid gland, trachea, and esophagus demonstrate  no significant findings. Lungs/Pleura: There is mild emphysematous lung disease involving the bilateral upper lobes. Mild areas of linear scarring and/or atelectasis are noted within the posterior aspects of the bilateral lung bases. There are very small bilateral pleural effusions. No pneumothorax is identified. Upper Abdomen: A thin layer of tiny gallstones is seen within the dependent portion of the gallbladder. Vascular stents are seen within the origins of the celiac artery, superior mesenteric artery and bilateral renal arteries. Musculoskeletal: Multilevel degenerative changes seen throughout the thoracic spine. IMPRESSION: 1. Mild emphysematous lung disease. 2. Mild bibasilar linear scarring and/or atelectasis. 3. Very small bilateral pleural effusions. 4. Extensive stenting of the distal descending thoracic aorta, as well as the abdominal aorta and several of its branches. 5. Cholelithiasis.  Aortic Atherosclerosis (ICD10-I70.0) and Emphysema (ICD10-J43.9). Electronically Signed   By: Virgina Norfolk M.D.   On: 04/12/2021 04:11   NM Pulmonary Perfusion  Result Date: 05/01/2021 CLINICAL DATA:  Shortness of breath EXAM: NUCLEAR MEDICINE PERFUSION LUNG SCAN TECHNIQUE: Perfusion images were obtained in multiple projections after intravenous injection of radiopharmaceutical. Ventilation scans intentionally deferred if perfusion scan and chest x-ray adequate for interpretation during COVID 19 epidemic. RADIOPHARMACEUTICALS:  4.1 mCi Tc-29m MAA IV COMPARISON:  Same day chest radiograph FINDINGS: Heterogeneous perfusion of the lung with multiple small subsegmental perfusion defects. IMPRESSION: Nondiagnostic (low or intermediate probability) Electronically Signed   By: Yetta Glassman M.D.   On: 05/01/2021 14:48   NM Pulmonary Perfusion  Result Date: 04/12/2021 CLINICAL DATA:  Evaluate for pulmonary embolus. Shortness of breath. EXAM: NUCLEAR MEDICINE PERFUSION LUNG SCAN TECHNIQUE: Perfusion images were obtained in multiple projections after intravenous injection of radiopharmaceutical. Ventilation scans intentionally deferred if perfusion scan and chest x-ray adequate for interpretation during COVID 19 epidemic. RADIOPHARMACEUTICALS:  4.3 mCi Tc-64m MAA IV COMPARISON:  Chest radiograph 04/11/21 FINDINGS: On the chest radiograph from 04/11/2021 there is a ICD overlying the left chest wall. No focal pulmonary opacities identified. On the perfusion portion of the examination there is a focal peripheral defect overlying the left upper lobe corresponding to the ICD battery pack. Mild heterogeneous distribution of the radiopharmaceutical is noted within both lungs. On the lateral projection images there are large nonsegmental areas of diminished activity within both posterior and upper lung zones which likely represents non-uniform soft tissue attenuation artifact secondary to patient's upper extremities.  No peripheral segmental perfusion defects to suggest acute pulmonary embolus. IMPRESSION: 1. No evidence for acute pulmonary embolus. Electronically Signed   By: Kerby Moors M.D.   On: 04/12/2021 11:01   US RENAL  Result Date: 04/08/2021 CLINICAL DATA:  Renal dysfunction. EXAM: RENAL / URINARY TRACT ULTRASOUND COMPLETE COMPARISON:  Abdominal sonogram done on 06/20/2015 FINDINGS: Right Kidney: Renal measurements: 10.9 x 5.3 x 5 cm = volume: 152 mL. There is no hydronephrosis. There is increased cortical echogenicity. Left Kidney: Renal measurements: 11.2 x 5.5 x 4.5 cm = volume: 147 mL. There is no hydronephrosis. There is increased cortical echogenicity. There is 2 cm cyst in the upper pole. Bladder: Appears normal for degree of bladder distention. Other: None. IMPRESSION: There is no hydronephrosis. Increased cortical echogenicity suggests medical renal disease. 2 cm left renal cyst. Electronically Signed   By: Elmer Picker M.D.   On: 04/08/2021 16:11   US Venous Img Upper Uni Left  Result Date: 04/11/2021 CLINICAL DATA:  Left upper extremity pain and swelling. EXAM: LEFT UPPER EXTREMITY VENOUS DOPPLER ULTRASOUND TECHNIQUE: Gray-scale sonography with graded compression, as well as color Doppler and duplex  ultrasound were performed to evaluate the upper extremity deep venous system from the level of the subclavian vein and including the jugular, axillary, basilic, radial, ulnar and upper cephalic vein. Spectral Doppler was utilized to evaluate flow at rest and with distal augmentation maneuvers. COMPARISON:  None. FINDINGS: Contralateral Subclavian Vein: Respiratory phasicity is normal and symmetric with the symptomatic side. No evidence of thrombus. Normal compressibility. Internal Jugular Vein: No evidence of thrombus. Normal compressibility, respiratory phasicity and response to augmentation. Subclavian Vein: No evidence of thrombus. Normal compressibility, respiratory phasicity and response  to augmentation. Axillary Vein: No evidence of thrombus. Normal compressibility, respiratory phasicity and response to augmentation. Cephalic Vein: No evidence of thrombus. Normal compressibility, respiratory phasicity and response to augmentation. Basilic Vein: No evidence of thrombus. Normal compressibility, respiratory phasicity and response to augmentation. Brachial Veins: No evidence of thrombus. Normal compressibility, respiratory phasicity and response to augmentation. Radial Veins: No evidence of thrombus. Normal compressibility, respiratory phasicity and response to augmentation. Ulnar Veins: No evidence of thrombus. Normal compressibility, respiratory phasicity and response to augmentation. Venous Reflux:  None visualized. Other Findings:  None visualized. IMPRESSION: No evidence of DVT within the LEFT upper extremity. Electronically Signed   By: Virgina Norfolk M.D.   On: 04/11/2021 20:25   DG Chest Port 1 View  Result Date: 05/01/2021 CLINICAL DATA:  Shortness of breath EXAM: PORTABLE CHEST 1 VIEW COMPARISON:  Previous studies including the examination of 04/27/2021 FINDINGS: Transverse diameter of heart is increased. Pacemaker battery is seen in the left infraclavicular region. Biventricular pacer leads are noted in place. Central pulmonary vessels are more prominent. Subtle increase in interstitial markings are seen in the lower lung fields, more so on the right side. Costophrenic angles are clear. There is no pneumothorax. IMPRESSION: Cardiomegaly. Central pulmonary vessels are prominent without signs of alveolar pulmonary edema. Increased interstitial markings are seen in the both lower lung fields, more so on the right side which may suggest mild asymmetric interstitial pulmonary edema or interstitial pneumonia. Electronically Signed   By: Elmer Picker M.D.   On: 05/01/2021 13:30    Microbiology: Recent Results (from the past 240 hour(s))  Resp Panel by RT-PCR (Flu A&B, Covid)  Nasopharyngeal Swab     Status: None   Collection Time: 04/27/21  6:30 PM   Specimen: Nasopharyngeal Swab; Nasopharyngeal(NP) swabs in vial transport medium  Result Value Ref Range Status   SARS Coronavirus 2 by RT PCR NEGATIVE NEGATIVE Final    Comment: (NOTE) SARS-CoV-2 target nucleic acids are NOT DETECTED.  The SARS-CoV-2 RNA is generally detectable in upper respiratory specimens during the acute phase of infection. The lowest concentration of SARS-CoV-2 viral copies this assay can detect is 138 copies/mL. A negative result does not preclude SARS-Cov-2 infection and should not be used as the sole basis for treatment or other patient management decisions. A negative result may occur with  improper specimen collection/handling, submission of specimen other than nasopharyngeal swab, presence of viral mutation(s) within the areas targeted by this assay, and inadequate number of viral copies(<138 copies/mL). A negative result must be combined with clinical observations, patient history, and epidemiological information. The expected result is Negative.  Fact Sheet for Patients:  EntrepreneurPulse.com.au  Fact Sheet for Healthcare Providers:  IncredibleEmployment.be  This test is no t yet approved or cleared by the [place] FDA and  has been authorized for detection and/or diagnosis of SARS-CoV-2 by FDA under an Emergency Use Authorization (EUA). This EUA will remain  in effect (meaning this test  can be used) for the duration of the COVID-19 declaration under Section 564(b)(1) of the Act, 21 U.S.C.section 360bbb-3(b)(1), unless the authorization is terminated  or revoked sooner.       Influenza A by PCR NEGATIVE NEGATIVE Final   Influenza B by PCR NEGATIVE NEGATIVE Final    Comment: (NOTE) The Xpert Xpress SARS-CoV-2/FLU/RSV plus assay is intended as an aid in the diagnosis of influenza from Nasopharyngeal swab specimens and should not be  used as a sole basis for treatment. Nasal washings and aspirates are unacceptable for Xpert Xpress SARS-CoV-2/FLU/RSV testing.  Fact Sheet for Patients: EntrepreneurPulse.com.au  Fact Sheet for Healthcare Providers: IncredibleEmployment.be  This test is not yet approved or cleared by the [place] FDA and has been authorized for detection and/or diagnosis of SARS-CoV-2 by FDA under an Emergency Use Authorization (EUA). This EUA will remain in effect (meaning this test can be used) for the duration of the COVID-19 declaration under Section 564(b)(1) of the Act, 21 U.S.C. section 360bbb-3(b)(1), unless the authorization is terminated or revoked.  Performed at [facility], [address] [place], [place] [address]      Labs: CBC: Recent Labs  Lab 04/27/21 2246  WBC 6.3  HGB 10.9*  HCT 33.1*  MCV 90.9  PLT 902*   Basic Metabolic Panel: Recent Labs  Lab 04/28/21 0611 04/29/21 0611 04/30/21 0729 05/01/21 0708 05/02/21 0652  NA 142 141 139 134* 139  K 3.5 3.6 3.8 3.3* 3.5  CL 109 108 105 101 107  CO2 24 26 28 27 26   GLUCOSE 87 92 87 120* 87  BUN 44* 44* 46* 52* 59*  CREATININE 3.04* 2.81* 3.67* 3.67* 3.77*  CALCIUM 8.1* 8.1* 8.1* 7.6* 7.8*   Liver Function Tests: No results for input(s): AST, ALT, ALKPHOS, BILITOT, PROT, ALBUMIN in the last 168 hours. No results for input(s): LIPASE, AMYLASE in the last 168 hours. No results for input(s): AMMONIA in the last 168 hours. Cardiac Enzymes: No results for input(s): CKTOTAL, CKMB, CKMBINDEX, TROPONINI in the last 168 hours. BNP (last 3 results) Recent Labs    04/03/21 2223 04/11/21 1436 04/27/21 1305  BNP 761.1* 876.6* 2,988.6*   CBG: No results for input(s): GLUCAP in the last 168 hours.  Time spent: 35 minutes  Signed:  Val Riles  [facility] 05/02/2021  3:38 PM

## 2021-05-06 ENCOUNTER — Telehealth: Payer: Self-pay

## 2021-05-06 DIAGNOSIS — Z95810 Presence of automatic (implantable) cardiac defibrillator: Secondary | ICD-10-CM | POA: Diagnosis not present

## 2021-05-06 DIAGNOSIS — E7800 Pure hypercholesterolemia, unspecified: Secondary | ICD-10-CM | POA: Diagnosis not present

## 2021-05-06 DIAGNOSIS — Z9981 Dependence on supplemental oxygen: Secondary | ICD-10-CM | POA: Diagnosis not present

## 2021-05-06 DIAGNOSIS — E559 Vitamin D deficiency, unspecified: Secondary | ICD-10-CM | POA: Diagnosis not present

## 2021-05-06 DIAGNOSIS — I700 Atherosclerosis of aorta: Secondary | ICD-10-CM | POA: Diagnosis not present

## 2021-05-06 DIAGNOSIS — I739 Peripheral vascular disease, unspecified: Secondary | ICD-10-CM | POA: Diagnosis not present

## 2021-05-06 DIAGNOSIS — I716 Thoracoabdominal aortic aneurysm, without rupture, unspecified: Secondary | ICD-10-CM | POA: Diagnosis not present

## 2021-05-06 DIAGNOSIS — N184 Chronic kidney disease, stage 4 (severe): Secondary | ICD-10-CM | POA: Diagnosis not present

## 2021-05-06 DIAGNOSIS — N138 Other obstructive and reflux uropathy: Secondary | ICD-10-CM | POA: Diagnosis not present

## 2021-05-06 DIAGNOSIS — I2510 Atherosclerotic heart disease of native coronary artery without angina pectoris: Secondary | ICD-10-CM | POA: Diagnosis not present

## 2021-05-06 DIAGNOSIS — N401 Enlarged prostate with lower urinary tract symptoms: Secondary | ICD-10-CM | POA: Diagnosis not present

## 2021-05-06 DIAGNOSIS — F17210 Nicotine dependence, cigarettes, uncomplicated: Secondary | ICD-10-CM | POA: Diagnosis not present

## 2021-05-06 DIAGNOSIS — J439 Emphysema, unspecified: Secondary | ICD-10-CM | POA: Diagnosis not present

## 2021-05-06 DIAGNOSIS — I5043 Acute on chronic combined systolic (congestive) and diastolic (congestive) heart failure: Secondary | ICD-10-CM | POA: Diagnosis not present

## 2021-05-06 DIAGNOSIS — I130 Hypertensive heart and chronic kidney disease with heart failure and stage 1 through stage 4 chronic kidney disease, or unspecified chronic kidney disease: Secondary | ICD-10-CM | POA: Diagnosis not present

## 2021-05-06 DIAGNOSIS — K219 Gastro-esophageal reflux disease without esophagitis: Secondary | ICD-10-CM | POA: Diagnosis not present

## 2021-05-06 DIAGNOSIS — G4700 Insomnia, unspecified: Secondary | ICD-10-CM | POA: Diagnosis not present

## 2021-05-06 DIAGNOSIS — J45909 Unspecified asthma, uncomplicated: Secondary | ICD-10-CM | POA: Diagnosis not present

## 2021-05-06 DIAGNOSIS — H3530 Unspecified macular degeneration: Secondary | ICD-10-CM | POA: Diagnosis not present

## 2021-05-06 DIAGNOSIS — I255 Ischemic cardiomyopathy: Secondary | ICD-10-CM | POA: Diagnosis not present

## 2021-05-06 DIAGNOSIS — Z7902 Long term (current) use of antithrombotics/antiplatelets: Secondary | ICD-10-CM | POA: Diagnosis not present

## 2021-05-06 DIAGNOSIS — I252 Old myocardial infarction: Secondary | ICD-10-CM | POA: Diagnosis not present

## 2021-05-06 DIAGNOSIS — D631 Anemia in chronic kidney disease: Secondary | ICD-10-CM | POA: Diagnosis not present

## 2021-05-06 DIAGNOSIS — M1990 Unspecified osteoarthritis, unspecified site: Secondary | ICD-10-CM | POA: Diagnosis not present

## 2021-05-06 NOTE — Telephone Encounter (Signed)
Post hospital discharge heart failure phone call.   Called and spoke directly with the patient.  States that he has all his medications, his daughter is the one who is setting up his pillbox.  Does have home health that comes in 4 hours a day.  Has not missed any of his medications.  He has follow-up in the outpatient heart failure clinic on February 2 at 1 PM.  He has a lady who will bring him to his appointment.  Is trying to follow a low-sodium diet.  Forgot to weigh himself this morning.  Reminded to weigh first thing in the morning, after going to the bathroom and then recording.  Enforced calling for 2 to 3 pound weight gain overnight or 5 pounds within a week.  Denied any increasing shortness of breath, he states going home on the oxygen he really has felt better.  He did not have any more questions.  Had no further concerns.  Pricilla Riffle RN [facility]

## 2021-05-08 ENCOUNTER — Ambulatory Visit: Payer: PPO | Admitting: Family

## 2021-05-13 DIAGNOSIS — N401 Enlarged prostate with lower urinary tract symptoms: Secondary | ICD-10-CM | POA: Diagnosis not present

## 2021-05-13 DIAGNOSIS — H3530 Unspecified macular degeneration: Secondary | ICD-10-CM | POA: Diagnosis not present

## 2021-05-13 DIAGNOSIS — J439 Emphysema, unspecified: Secondary | ICD-10-CM | POA: Diagnosis not present

## 2021-05-13 DIAGNOSIS — E559 Vitamin D deficiency, unspecified: Secondary | ICD-10-CM | POA: Diagnosis not present

## 2021-05-13 DIAGNOSIS — D631 Anemia in chronic kidney disease: Secondary | ICD-10-CM | POA: Diagnosis not present

## 2021-05-13 DIAGNOSIS — I716 Thoracoabdominal aortic aneurysm, without rupture, unspecified: Secondary | ICD-10-CM | POA: Diagnosis not present

## 2021-05-13 DIAGNOSIS — I130 Hypertensive heart and chronic kidney disease with heart failure and stage 1 through stage 4 chronic kidney disease, or unspecified chronic kidney disease: Secondary | ICD-10-CM | POA: Diagnosis not present

## 2021-05-13 DIAGNOSIS — J45909 Unspecified asthma, uncomplicated: Secondary | ICD-10-CM | POA: Diagnosis not present

## 2021-05-13 DIAGNOSIS — N184 Chronic kidney disease, stage 4 (severe): Secondary | ICD-10-CM | POA: Diagnosis not present

## 2021-05-13 DIAGNOSIS — M1990 Unspecified osteoarthritis, unspecified site: Secondary | ICD-10-CM | POA: Diagnosis not present

## 2021-05-13 DIAGNOSIS — Z7902 Long term (current) use of antithrombotics/antiplatelets: Secondary | ICD-10-CM | POA: Diagnosis not present

## 2021-05-13 DIAGNOSIS — K219 Gastro-esophageal reflux disease without esophagitis: Secondary | ICD-10-CM | POA: Diagnosis not present

## 2021-05-13 DIAGNOSIS — I2510 Atherosclerotic heart disease of native coronary artery without angina pectoris: Secondary | ICD-10-CM | POA: Diagnosis not present

## 2021-05-13 DIAGNOSIS — I255 Ischemic cardiomyopathy: Secondary | ICD-10-CM | POA: Diagnosis not present

## 2021-05-13 DIAGNOSIS — E7800 Pure hypercholesterolemia, unspecified: Secondary | ICD-10-CM | POA: Diagnosis not present

## 2021-05-13 DIAGNOSIS — I739 Peripheral vascular disease, unspecified: Secondary | ICD-10-CM | POA: Diagnosis not present

## 2021-05-13 DIAGNOSIS — I5043 Acute on chronic combined systolic (congestive) and diastolic (congestive) heart failure: Secondary | ICD-10-CM | POA: Diagnosis not present

## 2021-05-13 DIAGNOSIS — Z9981 Dependence on supplemental oxygen: Secondary | ICD-10-CM | POA: Diagnosis not present

## 2021-05-13 DIAGNOSIS — I252 Old myocardial infarction: Secondary | ICD-10-CM | POA: Diagnosis not present

## 2021-05-13 DIAGNOSIS — F17210 Nicotine dependence, cigarettes, uncomplicated: Secondary | ICD-10-CM | POA: Diagnosis not present

## 2021-05-13 DIAGNOSIS — G4700 Insomnia, unspecified: Secondary | ICD-10-CM | POA: Diagnosis not present

## 2021-05-13 DIAGNOSIS — Z95810 Presence of automatic (implantable) cardiac defibrillator: Secondary | ICD-10-CM | POA: Diagnosis not present

## 2021-05-13 DIAGNOSIS — N138 Other obstructive and reflux uropathy: Secondary | ICD-10-CM | POA: Diagnosis not present

## 2021-05-13 DIAGNOSIS — I700 Atherosclerosis of aorta: Secondary | ICD-10-CM | POA: Diagnosis not present

## 2021-05-15 ENCOUNTER — Ambulatory Visit: Payer: PPO | Admitting: Family

## 2021-05-15 ENCOUNTER — Telehealth: Payer: Self-pay | Admitting: Family

## 2021-05-15 DIAGNOSIS — Z7902 Long term (current) use of antithrombotics/antiplatelets: Secondary | ICD-10-CM | POA: Diagnosis not present

## 2021-05-15 DIAGNOSIS — E559 Vitamin D deficiency, unspecified: Secondary | ICD-10-CM | POA: Diagnosis not present

## 2021-05-15 DIAGNOSIS — I716 Thoracoabdominal aortic aneurysm, without rupture, unspecified: Secondary | ICD-10-CM | POA: Diagnosis not present

## 2021-05-15 DIAGNOSIS — J439 Emphysema, unspecified: Secondary | ICD-10-CM | POA: Diagnosis not present

## 2021-05-15 DIAGNOSIS — I5043 Acute on chronic combined systolic (congestive) and diastolic (congestive) heart failure: Secondary | ICD-10-CM | POA: Diagnosis not present

## 2021-05-15 DIAGNOSIS — N184 Chronic kidney disease, stage 4 (severe): Secondary | ICD-10-CM | POA: Diagnosis not present

## 2021-05-15 DIAGNOSIS — G4700 Insomnia, unspecified: Secondary | ICD-10-CM | POA: Diagnosis not present

## 2021-05-15 DIAGNOSIS — H3530 Unspecified macular degeneration: Secondary | ICD-10-CM | POA: Diagnosis not present

## 2021-05-15 DIAGNOSIS — I255 Ischemic cardiomyopathy: Secondary | ICD-10-CM | POA: Diagnosis not present

## 2021-05-15 DIAGNOSIS — I700 Atherosclerosis of aorta: Secondary | ICD-10-CM | POA: Diagnosis not present

## 2021-05-15 DIAGNOSIS — J45909 Unspecified asthma, uncomplicated: Secondary | ICD-10-CM | POA: Diagnosis not present

## 2021-05-15 DIAGNOSIS — K219 Gastro-esophageal reflux disease without esophagitis: Secondary | ICD-10-CM | POA: Diagnosis not present

## 2021-05-15 DIAGNOSIS — D631 Anemia in chronic kidney disease: Secondary | ICD-10-CM | POA: Diagnosis not present

## 2021-05-15 DIAGNOSIS — N401 Enlarged prostate with lower urinary tract symptoms: Secondary | ICD-10-CM | POA: Diagnosis not present

## 2021-05-15 DIAGNOSIS — F17210 Nicotine dependence, cigarettes, uncomplicated: Secondary | ICD-10-CM | POA: Diagnosis not present

## 2021-05-15 DIAGNOSIS — M1990 Unspecified osteoarthritis, unspecified site: Secondary | ICD-10-CM | POA: Diagnosis not present

## 2021-05-15 DIAGNOSIS — Z95810 Presence of automatic (implantable) cardiac defibrillator: Secondary | ICD-10-CM | POA: Diagnosis not present

## 2021-05-15 DIAGNOSIS — I252 Old myocardial infarction: Secondary | ICD-10-CM | POA: Diagnosis not present

## 2021-05-15 DIAGNOSIS — E7800 Pure hypercholesterolemia, unspecified: Secondary | ICD-10-CM | POA: Diagnosis not present

## 2021-05-15 DIAGNOSIS — Z9981 Dependence on supplemental oxygen: Secondary | ICD-10-CM | POA: Diagnosis not present

## 2021-05-15 DIAGNOSIS — I130 Hypertensive heart and chronic kidney disease with heart failure and stage 1 through stage 4 chronic kidney disease, or unspecified chronic kidney disease: Secondary | ICD-10-CM | POA: Diagnosis not present

## 2021-05-15 DIAGNOSIS — I2510 Atherosclerotic heart disease of native coronary artery without angina pectoris: Secondary | ICD-10-CM | POA: Diagnosis not present

## 2021-05-15 DIAGNOSIS — I739 Peripheral vascular disease, unspecified: Secondary | ICD-10-CM | POA: Diagnosis not present

## 2021-05-15 DIAGNOSIS — N138 Other obstructive and reflux uropathy: Secondary | ICD-10-CM | POA: Diagnosis not present

## 2021-05-15 NOTE — Progress Notes (Deleted)
Patient ID: Peter [name], male    DOB: Dec 20, 1940, 81 y.o.   MRN: [id number]  HPI  Peter [name] is a 81 y/o male with a history of asthma, CAD, hyperlipidemia, HTN, hypokalemia, MI, NSTEMI, multiple stents, current tobacco use and chronic heart failure.   Echo report from 04/04/21 reviewed and showed an EF of 50-55% along with mild LVH and mild Peter. Echo report done 10/01/16 reviewed and shows an EF of 35% along with moderate Peter/TR. Reviewed echo report on 08/08/16 which showed an EF of 35-40% along with mild AR and moderate Peter/TR. EF has declined from a previous echo done 12/15/11.   Cardiac catheterization done 08/10/16 showed one vessel CAD with high-grade in stent restenosis in the proximal LAD. DES successfully placed in proximal LAD.  Admitted 04/27/21 due to shortness of breath. Previously furosemide was stopped due to AKI.                 Cardiology, nephrology & pulmonology consults obtained.            PT/OT evaluations done.               Discharged after 5 days.   He presents today for a follow-up visit today although hasn't been seen since March 2019. He presents with a chief complaint of   Past Medical History:  Diagnosis Date   Anginal pain ([place])    Asthma    Bladder infection, acute 03/2011   "had a whole lot of bleeding from this"   CHF (congestive heart failure) ([place])    Coronary artery disease    High cholesterol    Hypertension    Macular degeneration 12/14/2011   "had it in my right; getting shots now in my left"   Myocardial infarction [facility]) 1996   NSTEMI (non-ST elevated myocardial infarction) ([place]) 12/14/2011   Shortness of breath 12/14/2011   "@ rest, lying down, w/exertion"   Past Surgical History:  Procedure Laterality Date   [place]; ~ 2003; ~ 2008   "total of 3"   CORONARY STENT INTERVENTION N/A 08/10/2016   Procedure: Coronary Stent Intervention;  Surgeon: [name], MD;  Location: [place] CV LAB;  Service:  Cardiovascular;  Laterality: N/A;   HERNIA REPAIR  ~ 2001   "abdominal w/mesh implanted"   LEFT HEART CATH AND CORONARY ANGIOGRAPHY N/A 08/10/2016   Procedure: Left Heart Cath and Coronary Angiography;  Surgeon: [name], MD;  Location: [place] CV LAB;  Service: Cardiovascular;  Laterality: N/A;   LEFT HEART CATHETERIZATION WITH CORONARY ANGIOGRAM N/A 12/16/2011   Procedure: LEFT HEART CATHETERIZATION WITH CORONARY ANGIOGRAM;  Surgeon: Minus Breeding, MD;  Location: [facility] CATH LAB;  Service: Cardiovascular;  Laterality: N/A;   PERCUTANEOUS CORONARY STENT INTERVENTION (PCI-S) N/A 12/17/2011   Procedure: PERCUTANEOUS CORONARY STENT INTERVENTION (PCI-S);  Surgeon: Sherren Mocha, MD;  Location: [facility] CATH LAB;  Service: Cardiovascular;  Laterality: N/A;   TONSILLECTOMY     "I was a kid"   Family History  Family history unknown: Yes   Social History   Tobacco Use   Smoking status: Former    Packs/day: 0.50    Years: 0.50    Pack years: 0.25    Types: Cigarettes   Smokeless tobacco: Never   Tobacco comments:    11/23/16 Still not ready to quit smoking.  Substance Use Topics   Alcohol use: Yes    Alcohol/week: 1.0 standard drink    Types: 1 Cans  of beer per week    Comment: 12/14/2011 "if my kidneys are sore, I drink 1 beer/day; if not sore; no beer; occasionally drink socially; ave 1 beer/wk maybe"   Allergies  Allergen Reactions   Iodinated Contrast Media Shortness Of Breath   Iodinated Glycerol  [Glycerol, Iodinated] Shortness Of Breath   Prior to Admission medications   Medication Sig Start Date End Date Taking? Authorizing Provider  amLODipine-benazepril (LOTREL) 10-40 MG capsule TAKE 1 CAPSULE BY MOUTH EVERY DAY 10/21/15  Yes [provider]  aspirin 325 MG tablet Take 325 mg by mouth 2 (two) times daily. Patient takes if there is any pain or burning in the chest.   Yes [provider]  atorvastatin (LIPITOR) 40 MG tablet Take 1 tablet (40 mg total) by  mouth daily at 6 PM. 12/18/11  Yes Annita Brod, MD  carvedilol (COREG) 3.125 MG tablet Take 3.125 mg by mouth 2 (two) times daily with a meal.   Yes [provider]  cetirizine (WAL-ZYR) 10 MG tablet Take 10 mg by mouth.   Yes [provider]  clobetasol cream (TEMOVATE) 0.05 % APPLY TO PSORIASIS BID PRN UNTIL SMOOTH 11/05/14  Yes [provider]  clopidogrel (PLAVIX) 75 MG tablet TAKE 1 TABLET(75 MG) BY MOUTH EVERY DAY 08/11/16  Yes Fritzi Mandes, MD  cyanocobalamin 1000 MCG tablet Take 1,000 mcg by mouth daily.    Yes [provider]  finasteride (PROSCAR) 5 MG tablet Take 5 mg by mouth daily.   Yes [provider]  furosemide (LASIX) 20 MG tablet Take 1 tablet (20 mg total) by mouth daily. Patient taking differently: Take 20 mg by mouth 2 (two) times daily.  09/10/16 09/10/17 Yes Epifanio Lesches, MD  GLUCOSAMINE-CHONDROITIN-VIT C PO Take 1 tablet by mouth daily.   Yes [provider]  hydrochlorothiazide (HYDRODIURIL) 12.5 MG tablet Take 12.5 mg by mouth daily.  04/22/15  Yes [provider]  isosorbide mononitrate (IMDUR) 30 MG 24 hr tablet Take 30 mg by mouth daily. 06/18/16  Yes [provider]  omeprazole (PRILOSEC) 20 MG capsule Take by mouth. 01/16/16  Yes [provider]  spironolactone (ALDACTONE) 25 MG tablet Take 0.5 tablets by mouth daily. 10/06/16 10/06/17 Yes [provider]  tamsulosin (FLOMAX) 0.4 MG CAPS capsule TAKE 1 CAPSULE BY MOUTH EVERY DAY 10/28/15  Yes [provider]    Review of Systems  Constitutional:  Positive for fatigue (minimal). Negative for appetite change.  HENT:  Positive for congestion. Negative for rhinorrhea and sore throat.   Eyes: Negative.   Respiratory:  Positive for shortness of breath (occasionally). Negative for cough and chest tightness.   Cardiovascular:  Negative for chest pain, palpitations and leg swelling.  Gastrointestinal:  Negative for abdominal  distention and abdominal pain.  Endocrine: Negative.   Genitourinary: Negative.   Musculoskeletal:  Negative for back pain and neck pain.  Skin: Negative.   Allergic/Immunologic: Negative.   Neurological:  Negative for dizziness and light-headedness.  Hematological:  Negative for adenopathy. Bruises/bleeds easily.  Psychiatric/Behavioral:  Negative for dysphoric mood and sleep disturbance (sleeping well). The patient is not nervous/anxious.   There were no vitals filed for this visit.  Wt Readings from Last 3 Encounters:  05/02/21 211 lb 12.8 oz (96.1 kg)  04/11/21 205 lb (93 kg)  04/09/21 205 lb 6.4 oz (93.2 kg)   Lab Results  Component Value Date   CREATININE 3.77 (H) 05/02/2021   CREATININE 3.67 (H) 05/01/2021   CREATININE  3.67 (H) 04/30/2021    Physical Exam Vitals and nursing note reviewed.  Constitutional:      Appearance: He is well-developed.  HENT:     Head: Normocephalic and atraumatic.  Neck:     Vascular: No JVD.  Cardiovascular:     Rate and Rhythm: Normal rate and regular rhythm.  Pulmonary:     Effort: Pulmonary effort is normal.     Breath sounds: No wheezing or rales.  Abdominal:     General: There is no distension.     Palpations: Abdomen is soft.     Tenderness: There is no abdominal tenderness.  Musculoskeletal:        General: No tenderness.     Cervical back: Normal range of motion and neck supple.  Skin:    General: Skin is warm and dry.  Neurological:     Mental Status: He is alert and oriented to person, place, and time.  Psychiatric:        Behavior: Behavior normal.        Thought Content: Thought content normal.   Assessment & Plan:  1: Chronic heart failure with preserved ejection fraction with structural changes (LVH)- - NYHA class II - euvolemic today - weighing daily. Reminded to call for an overnight weight gain of >2 pounds or a weekly weight gain of >5 pounds - weight up 6 pounds since he was last here September 2018 - says  that he ate [place] food the other night and gained 4 pounds overnight and he has now dropped 2 pounds back down - not adding salt and has been reading food labels. Reviewed the importance of closely following a 2000mg  sodium diet - isn't interested in entresto  - saw cardiologist (Dr. Clayborn Bigness) 03/30/17 and returns in 6 months (June 2019) - received flu vaccine for this season  2: HTN- - BP looks good today - saw PCP (Hande) 04/19/17 - BMP on  04/12/17 [facility]) reviewed and showed sodium 141, potassium 4.0 and GFR 45  3: Tobacco use- - smoking 1 ppd of cigarettes and he's not interested in stopping - Complete cessation discussed for 3 minutes with him   Patient did not bring his medications nor a list. Each medication was verbally reviewed with the patient and he was encouraged to bring the bottles to every visit to confirm accuracy of list  Patient opts to not make a return appointment at this time. He was advised that he could call back at any point in the future for another appointment.

## 2021-05-15 NOTE — Telephone Encounter (Signed)
Patient did not show for his [facility] appointment on 05/15/21. Will attempt to reschedule.

## 2021-05-16 DIAGNOSIS — N189 Chronic kidney disease, unspecified: Secondary | ICD-10-CM | POA: Diagnosis not present

## 2021-05-16 DIAGNOSIS — J449 Chronic obstructive pulmonary disease, unspecified: Secondary | ICD-10-CM | POA: Diagnosis not present

## 2021-05-16 DIAGNOSIS — N184 Chronic kidney disease, stage 4 (severe): Secondary | ICD-10-CM | POA: Diagnosis not present

## 2021-05-16 DIAGNOSIS — D631 Anemia in chronic kidney disease: Secondary | ICD-10-CM | POA: Diagnosis not present

## 2021-05-16 DIAGNOSIS — I5022 Chronic systolic (congestive) heart failure: Secondary | ICD-10-CM | POA: Diagnosis not present

## 2021-05-16 DIAGNOSIS — Z09 Encounter for follow-up examination after completed treatment for conditions other than malignant neoplasm: Secondary | ICD-10-CM | POA: Diagnosis not present

## 2021-05-20 DIAGNOSIS — I255 Ischemic cardiomyopathy: Secondary | ICD-10-CM | POA: Diagnosis not present

## 2021-05-22 DIAGNOSIS — E782 Mixed hyperlipidemia: Secondary | ICD-10-CM | POA: Diagnosis not present

## 2021-05-22 DIAGNOSIS — I255 Ischemic cardiomyopathy: Secondary | ICD-10-CM | POA: Diagnosis not present

## 2021-05-22 DIAGNOSIS — I5020 Unspecified systolic (congestive) heart failure: Secondary | ICD-10-CM | POA: Diagnosis not present

## 2021-05-22 DIAGNOSIS — N183 Chronic kidney disease, stage 3 unspecified: Secondary | ICD-10-CM | POA: Diagnosis not present

## 2021-05-22 DIAGNOSIS — R0602 Shortness of breath: Secondary | ICD-10-CM | POA: Diagnosis not present

## 2021-05-22 DIAGNOSIS — I2510 Atherosclerotic heart disease of native coronary artery without angina pectoris: Secondary | ICD-10-CM | POA: Diagnosis not present

## 2021-05-22 DIAGNOSIS — Z9889 Other specified postprocedural states: Secondary | ICD-10-CM | POA: Diagnosis not present

## 2021-05-22 DIAGNOSIS — I10 Essential (primary) hypertension: Secondary | ICD-10-CM | POA: Diagnosis not present

## 2021-05-22 DIAGNOSIS — Z95810 Presence of automatic (implantable) cardiac defibrillator: Secondary | ICD-10-CM | POA: Diagnosis not present

## 2021-05-22 DIAGNOSIS — Z720 Tobacco use: Secondary | ICD-10-CM | POA: Diagnosis not present

## 2021-05-22 DIAGNOSIS — I509 Heart failure, unspecified: Secondary | ICD-10-CM | POA: Diagnosis not present

## 2021-05-22 DIAGNOSIS — Z955 Presence of coronary angioplasty implant and graft: Secondary | ICD-10-CM | POA: Diagnosis not present

## 2021-05-26 ENCOUNTER — Other Ambulatory Visit: Payer: Self-pay | Admitting: Internal Medicine

## 2021-05-26 DIAGNOSIS — I509 Heart failure, unspecified: Secondary | ICD-10-CM

## 2021-05-26 DIAGNOSIS — R0602 Shortness of breath: Secondary | ICD-10-CM

## 2021-05-27 ENCOUNTER — Other Ambulatory Visit: Payer: Self-pay

## 2021-05-27 ENCOUNTER — Ambulatory Visit
Admission: RE | Admit: 2021-05-27 | Discharge: 2021-05-27 | Disposition: A | Discharge status: Home / Self Care | Payer: PPO | Source: Ambulatory Visit | Attending: Internal Medicine | Admitting: Internal Medicine

## 2021-05-27 DIAGNOSIS — I130 Hypertensive heart and chronic kidney disease with heart failure and stage 1 through stage 4 chronic kidney disease, or unspecified chronic kidney disease: Secondary | ICD-10-CM | POA: Diagnosis present

## 2021-05-27 DIAGNOSIS — Z720 Tobacco use: Secondary | ICD-10-CM | POA: Diagnosis not present

## 2021-05-27 DIAGNOSIS — I491 Atrial premature depolarization: Secondary | ICD-10-CM | POA: Diagnosis not present

## 2021-05-27 DIAGNOSIS — D631 Anemia in chronic kidney disease: Secondary | ICD-10-CM | POA: Diagnosis present

## 2021-05-27 DIAGNOSIS — I9789 Other postprocedural complications and disorders of the circulatory system, not elsewhere classified: Secondary | ICD-10-CM | POA: Diagnosis not present

## 2021-05-27 DIAGNOSIS — R531 Weakness: Secondary | ICD-10-CM | POA: Diagnosis not present

## 2021-05-27 DIAGNOSIS — K922 Gastrointestinal hemorrhage, unspecified: Secondary | ICD-10-CM | POA: Diagnosis not present

## 2021-05-27 DIAGNOSIS — Z20822 Contact with and (suspected) exposure to covid-19: Secondary | ICD-10-CM | POA: Diagnosis present

## 2021-05-27 DIAGNOSIS — K2971 Gastritis, unspecified, with bleeding: Secondary | ICD-10-CM | POA: Diagnosis present

## 2021-05-27 DIAGNOSIS — R310 Gross hematuria: Secondary | ICD-10-CM | POA: Diagnosis not present

## 2021-05-27 DIAGNOSIS — R0789 Other chest pain: Secondary | ICD-10-CM | POA: Diagnosis not present

## 2021-05-27 DIAGNOSIS — K921 Melena: Secondary | ICD-10-CM | POA: Diagnosis present

## 2021-05-27 DIAGNOSIS — R0902 Hypoxemia: Secondary | ICD-10-CM | POA: Diagnosis present

## 2021-05-27 DIAGNOSIS — Z91041 Radiographic dye allergy status: Secondary | ICD-10-CM | POA: Diagnosis not present

## 2021-05-27 DIAGNOSIS — I3139 Other pericardial effusion (noninflammatory): Secondary | ICD-10-CM | POA: Diagnosis not present

## 2021-05-27 DIAGNOSIS — I5043 Acute on chronic combined systolic (congestive) and diastolic (congestive) heart failure: Secondary | ICD-10-CM | POA: Diagnosis present

## 2021-05-27 DIAGNOSIS — I700 Atherosclerosis of aorta: Secondary | ICD-10-CM | POA: Diagnosis not present

## 2021-05-27 DIAGNOSIS — D649 Anemia, unspecified: Secondary | ICD-10-CM | POA: Diagnosis not present

## 2021-05-27 DIAGNOSIS — Z8551 Personal history of malignant neoplasm of bladder: Secondary | ICD-10-CM | POA: Diagnosis not present

## 2021-05-27 DIAGNOSIS — I509 Heart failure, unspecified: Secondary | ICD-10-CM | POA: Diagnosis not present

## 2021-05-27 DIAGNOSIS — J9811 Atelectasis: Secondary | ICD-10-CM | POA: Diagnosis not present

## 2021-05-27 DIAGNOSIS — R918 Other nonspecific abnormal finding of lung field: Secondary | ICD-10-CM | POA: Diagnosis not present

## 2021-05-27 DIAGNOSIS — R0602 Shortness of breath: Secondary | ICD-10-CM

## 2021-05-27 DIAGNOSIS — K219 Gastro-esophageal reflux disease without esophagitis: Secondary | ICD-10-CM | POA: Diagnosis present

## 2021-05-27 DIAGNOSIS — N401 Enlarged prostate with lower urinary tract symptoms: Secondary | ICD-10-CM | POA: Diagnosis present

## 2021-05-27 DIAGNOSIS — I447 Left bundle-branch block, unspecified: Secondary | ICD-10-CM | POA: Diagnosis present

## 2021-05-27 DIAGNOSIS — R338 Other retention of urine: Secondary | ICD-10-CM | POA: Diagnosis present

## 2021-05-27 DIAGNOSIS — I10 Essential (primary) hypertension: Secondary | ICD-10-CM | POA: Diagnosis not present

## 2021-05-27 DIAGNOSIS — N184 Chronic kidney disease, stage 4 (severe): Secondary | ICD-10-CM | POA: Diagnosis present

## 2021-05-27 DIAGNOSIS — J90 Pleural effusion, not elsewhere classified: Secondary | ICD-10-CM | POA: Diagnosis not present

## 2021-05-27 DIAGNOSIS — I214 Non-ST elevation (NSTEMI) myocardial infarction: Secondary | ICD-10-CM | POA: Diagnosis present

## 2021-05-27 DIAGNOSIS — N179 Acute kidney failure, unspecified: Secondary | ICD-10-CM | POA: Diagnosis present

## 2021-05-27 DIAGNOSIS — R079 Chest pain, unspecified: Secondary | ICD-10-CM | POA: Diagnosis not present

## 2021-05-27 DIAGNOSIS — Z955 Presence of coronary angioplasty implant and graft: Secondary | ICD-10-CM | POA: Diagnosis not present

## 2021-05-27 DIAGNOSIS — I248 Other forms of acute ischemic heart disease: Secondary | ICD-10-CM | POA: Diagnosis present

## 2021-05-27 DIAGNOSIS — J439 Emphysema, unspecified: Secondary | ICD-10-CM | POA: Diagnosis not present

## 2021-05-27 DIAGNOSIS — D696 Thrombocytopenia, unspecified: Secondary | ICD-10-CM | POA: Diagnosis present

## 2021-05-27 DIAGNOSIS — E1122 Type 2 diabetes mellitus with diabetic chronic kidney disease: Secondary | ICD-10-CM | POA: Diagnosis present

## 2021-05-27 DIAGNOSIS — E7800 Pure hypercholesterolemia, unspecified: Secondary | ICD-10-CM | POA: Diagnosis present

## 2021-05-27 DIAGNOSIS — D62 Acute posthemorrhagic anemia: Secondary | ICD-10-CM | POA: Diagnosis not present

## 2021-05-27 DIAGNOSIS — Z95810 Presence of automatic (implantable) cardiac defibrillator: Secondary | ICD-10-CM | POA: Diagnosis not present

## 2021-05-27 DIAGNOSIS — R911 Solitary pulmonary nodule: Secondary | ICD-10-CM | POA: Diagnosis not present

## 2021-05-27 DIAGNOSIS — D6832 Hemorrhagic disorder due to extrinsic circulating anticoagulants: Secondary | ICD-10-CM | POA: Diagnosis present

## 2021-05-27 DIAGNOSIS — I252 Old myocardial infarction: Secondary | ICD-10-CM | POA: Diagnosis not present

## 2021-05-28 ENCOUNTER — Other Ambulatory Visit: Payer: Self-pay

## 2021-05-28 ENCOUNTER — Emergency Department: Payer: PPO

## 2021-05-28 ENCOUNTER — Inpatient Hospital Stay
Admission: EM | Admit: 2021-05-28 | Discharge: 2021-06-04 | DRG: 377 | Disposition: A | Discharge status: Home Health | Payer: PPO | Attending: Internal Medicine | Admitting: Internal Medicine

## 2021-05-28 ENCOUNTER — Encounter: Payer: Self-pay | Admitting: Internal Medicine

## 2021-05-28 DIAGNOSIS — D6832 Hemorrhagic disorder due to extrinsic circulating anticoagulants: Secondary | ICD-10-CM | POA: Diagnosis present

## 2021-05-28 DIAGNOSIS — K219 Gastro-esophageal reflux disease without esophagitis: Secondary | ICD-10-CM | POA: Diagnosis present

## 2021-05-28 DIAGNOSIS — E1122 Type 2 diabetes mellitus with diabetic chronic kidney disease: Secondary | ICD-10-CM | POA: Diagnosis present

## 2021-05-28 DIAGNOSIS — D631 Anemia in chronic kidney disease: Secondary | ICD-10-CM | POA: Diagnosis present

## 2021-05-28 DIAGNOSIS — K922 Gastrointestinal hemorrhage, unspecified: Secondary | ICD-10-CM

## 2021-05-28 DIAGNOSIS — N179 Acute kidney failure, unspecified: Secondary | ICD-10-CM | POA: Diagnosis present

## 2021-05-28 DIAGNOSIS — R338 Other retention of urine: Secondary | ICD-10-CM | POA: Diagnosis present

## 2021-05-28 DIAGNOSIS — Z720 Tobacco use: Secondary | ICD-10-CM

## 2021-05-28 DIAGNOSIS — N184 Chronic kidney disease, stage 4 (severe): Secondary | ICD-10-CM | POA: Diagnosis not present

## 2021-05-28 DIAGNOSIS — Z8551 Personal history of malignant neoplasm of bladder: Secondary | ICD-10-CM | POA: Diagnosis not present

## 2021-05-28 DIAGNOSIS — N401 Enlarged prostate with lower urinary tract symptoms: Secondary | ICD-10-CM | POA: Diagnosis present

## 2021-05-28 DIAGNOSIS — K2971 Gastritis, unspecified, with bleeding: Secondary | ICD-10-CM | POA: Diagnosis present

## 2021-05-28 DIAGNOSIS — K921 Melena: Secondary | ICD-10-CM | POA: Diagnosis not present

## 2021-05-28 DIAGNOSIS — I5043 Acute on chronic combined systolic (congestive) and diastolic (congestive) heart failure: Secondary | ICD-10-CM | POA: Diagnosis present

## 2021-05-28 DIAGNOSIS — I130 Hypertensive heart and chronic kidney disease with heart failure and stage 1 through stage 4 chronic kidney disease, or unspecified chronic kidney disease: Secondary | ICD-10-CM | POA: Diagnosis present

## 2021-05-28 DIAGNOSIS — T45515A Adverse effect of anticoagulants, initial encounter: Secondary | ICD-10-CM | POA: Diagnosis present

## 2021-05-28 DIAGNOSIS — Z7902 Long term (current) use of antithrombotics/antiplatelets: Secondary | ICD-10-CM

## 2021-05-28 DIAGNOSIS — I214 Non-ST elevation (NSTEMI) myocardial infarction: Secondary | ICD-10-CM | POA: Diagnosis present

## 2021-05-28 DIAGNOSIS — I509 Heart failure, unspecified: Secondary | ICD-10-CM | POA: Diagnosis not present

## 2021-05-28 DIAGNOSIS — E7800 Pure hypercholesterolemia, unspecified: Secondary | ICD-10-CM | POA: Diagnosis present

## 2021-05-28 DIAGNOSIS — L409 Psoriasis, unspecified: Secondary | ICD-10-CM | POA: Diagnosis present

## 2021-05-28 DIAGNOSIS — R079 Chest pain, unspecified: Secondary | ICD-10-CM | POA: Diagnosis not present

## 2021-05-28 DIAGNOSIS — Z79899 Other long term (current) drug therapy: Secondary | ICD-10-CM

## 2021-05-28 DIAGNOSIS — R310 Gross hematuria: Secondary | ICD-10-CM | POA: Diagnosis not present

## 2021-05-28 DIAGNOSIS — I252 Old myocardial infarction: Secondary | ICD-10-CM

## 2021-05-28 DIAGNOSIS — Z20822 Contact with and (suspected) exposure to covid-19: Secondary | ICD-10-CM | POA: Diagnosis present

## 2021-05-28 DIAGNOSIS — I447 Left bundle-branch block, unspecified: Secondary | ICD-10-CM | POA: Diagnosis present

## 2021-05-28 DIAGNOSIS — D62 Acute posthemorrhagic anemia: Secondary | ICD-10-CM | POA: Diagnosis not present

## 2021-05-28 DIAGNOSIS — D696 Thrombocytopenia, unspecified: Secondary | ICD-10-CM | POA: Diagnosis present

## 2021-05-28 DIAGNOSIS — I248 Other forms of acute ischemic heart disease: Secondary | ICD-10-CM | POA: Diagnosis present

## 2021-05-28 DIAGNOSIS — I10 Essential (primary) hypertension: Secondary | ICD-10-CM | POA: Diagnosis present

## 2021-05-28 DIAGNOSIS — D649 Anemia, unspecified: Secondary | ICD-10-CM | POA: Diagnosis not present

## 2021-05-28 DIAGNOSIS — R0602 Shortness of breath: Secondary | ICD-10-CM | POA: Diagnosis present

## 2021-05-28 DIAGNOSIS — Z95810 Presence of automatic (implantable) cardiac defibrillator: Secondary | ICD-10-CM | POA: Diagnosis not present

## 2021-05-28 DIAGNOSIS — I25118 Atherosclerotic heart disease of native coronary artery with other forms of angina pectoris: Secondary | ICD-10-CM | POA: Diagnosis present

## 2021-05-28 DIAGNOSIS — R319 Hematuria, unspecified: Secondary | ICD-10-CM | POA: Diagnosis not present

## 2021-05-28 DIAGNOSIS — Z91041 Radiographic dye allergy status: Secondary | ICD-10-CM

## 2021-05-28 DIAGNOSIS — I2510 Atherosclerotic heart disease of native coronary artery without angina pectoris: Secondary | ICD-10-CM | POA: Diagnosis present

## 2021-05-28 DIAGNOSIS — R0789 Other chest pain: Secondary | ICD-10-CM | POA: Diagnosis not present

## 2021-05-28 DIAGNOSIS — R531 Weakness: Secondary | ICD-10-CM | POA: Diagnosis not present

## 2021-05-28 DIAGNOSIS — Z955 Presence of coronary angioplasty implant and graft: Secondary | ICD-10-CM | POA: Diagnosis not present

## 2021-05-28 DIAGNOSIS — R0902 Hypoxemia: Secondary | ICD-10-CM | POA: Diagnosis present

## 2021-05-28 DIAGNOSIS — I9789 Other postprocedural complications and disorders of the circulatory system, not elsewhere classified: Secondary | ICD-10-CM | POA: Diagnosis not present

## 2021-05-28 DIAGNOSIS — I491 Atrial premature depolarization: Secondary | ICD-10-CM | POA: Diagnosis not present

## 2021-05-28 LAB — LACTATE DEHYDROGENASE: LDH: 147 U/L (ref 98–192)

## 2021-05-28 LAB — BASIC METABOLIC PANEL
Anion gap: 6 (ref 5–15)
BUN: 75 mg/dL — ABNORMAL HIGH (ref 8–23)
CO2: 24 mmol/L (ref 22–32)
Calcium: 8.6 mg/dL — ABNORMAL LOW (ref 8.9–10.3)
Chloride: 113 mmol/L — ABNORMAL HIGH (ref 98–111)
Creatinine, Ser: 3.49 mg/dL — ABNORMAL HIGH (ref 0.61–1.24)
GFR, Estimated: 17 mL/min — ABNORMAL LOW (ref >60)
Glucose, Bld: 119 mg/dL — ABNORMAL HIGH (ref 70–99)
Potassium: 3.7 mmol/L (ref 3.5–5.1)
Sodium: 143 mmol/L (ref 135–145)

## 2021-05-28 LAB — TROPONIN I (HIGH SENSITIVITY)
Troponin I (High Sensitivity): 1235 ng/L (ref <18)
Troponin I (High Sensitivity): 1328 ng/L (ref <18)

## 2021-05-28 LAB — PROTIME-INR
INR: 1.3 — ABNORMAL HIGH (ref 0.8–1.2)
Prothrombin Time: 16 seconds — ABNORMAL HIGH (ref 11.4–15.2)

## 2021-05-28 LAB — CBC
HCT: 24.1 % — ABNORMAL LOW (ref 39.0–52.0)
Hemoglobin: 7.7 g/dL — ABNORMAL LOW (ref 13.0–17.0)
MCH: 29.6 pg (ref 26.0–34.0)
MCHC: 32 g/dL (ref 30.0–36.0)
MCV: 92.7 fL (ref 80.0–100.0)
Platelets: 130 10*3/uL — ABNORMAL LOW (ref 150–400)
RBC: 2.6 MIL/uL — ABNORMAL LOW (ref 4.22–5.81)
RDW: 13.7 % (ref 11.5–15.5)
WBC: 7.3 10*3/uL (ref 4.0–10.5)
nRBC: 0 % (ref 0.0–0.2)

## 2021-05-28 LAB — HEPATIC FUNCTION PANEL
ALT: 15 U/L (ref 0–44)
AST: 16 U/L (ref 15–41)
Albumin: 3 g/dL — ABNORMAL LOW (ref 3.5–5.0)
Alkaline Phosphatase: 75 U/L (ref 38–126)
Bilirubin, Direct: <0.1 mg/dL (ref 0.0–0.2)
Total Bilirubin: 0.6 mg/dL (ref 0.3–1.2)
Total Protein: 5.2 g/dL — ABNORMAL LOW (ref 6.5–8.1)

## 2021-05-28 LAB — RESP PANEL BY RT-PCR (FLU A&B, COVID) ARPGX2
Influenza A by PCR: NEGATIVE
Influenza B by PCR: NEGATIVE
SARS Coronavirus 2 by RT PCR: NEGATIVE

## 2021-05-28 LAB — FERRITIN: Ferritin: 111 ng/mL (ref 24–336)

## 2021-05-28 LAB — IRON AND TIBC
Iron: 38 ug/dL — ABNORMAL LOW (ref 45–182)
Saturation Ratios: 20 % (ref 17.9–39.5)
TIBC: 188 ug/dL — ABNORMAL LOW (ref 250–450)
UIBC: 150 ug/dL

## 2021-05-28 LAB — BRAIN NATRIURETIC PEPTIDE: B Natriuretic Peptide: 2842.5 pg/mL — ABNORMAL HIGH (ref 0.0–100.0)

## 2021-05-28 LAB — RETICULOCYTES
Immature Retic Fract: 13.1 % (ref 2.3–15.9)
RBC.: 2.52 MIL/uL — ABNORMAL LOW (ref 4.22–5.81)
Retic Count, Absolute: 51.2 10*3/uL (ref 19.0–186.0)
Retic Ct Pct: 2 % (ref 0.4–3.1)

## 2021-05-28 LAB — APTT: aPTT: 33 seconds (ref 24–36)

## 2021-05-28 MED ORDER — SODIUM CHLORIDE 0.9 % IV SOLN
10.0000 mL/h | Freq: Once | INTRAVENOUS | Status: AC
Start: 2021-05-28 — End: 2021-05-29
  Administered 2021-05-28: 10 mL/h via INTRAVENOUS

## 2021-05-28 MED ORDER — HEPARIN SODIUM (PORCINE) 5000 UNIT/ML IJ SOLN
5000.0000 [IU] | Freq: Three times a day (TID) | INTRAMUSCULAR | Status: DC
  Administered 2021-05-28: 5000 [IU] via SUBCUTANEOUS
  Filled 2021-05-28: qty 1

## 2021-05-28 MED ORDER — HYDRALAZINE HCL 20 MG/ML IJ SOLN
10.0000 mg | Freq: Four times a day (QID) | INTRAMUSCULAR | Status: DC | PRN
  Administered 2021-05-29: 10 mg via INTRAVENOUS
  Filled 2021-05-28: qty 1

## 2021-05-28 MED ORDER — ONDANSETRON HCL 4 MG/2ML IJ SOLN: 4.0000 mg | Freq: Four times a day (QID) | INTRAMUSCULAR | Status: DC | PRN

## 2021-05-28 MED ORDER — ACETAMINOPHEN 325 MG PO TABS: 650.0000 mg | ORAL_TABLET | ORAL | Status: DC | PRN

## 2021-05-28 MED ORDER — PANTOPRAZOLE SODIUM 40 MG IV SOLR
40.0000 mg | Freq: Once | INTRAVENOUS | Status: AC
Start: 2021-05-28 — End: 2021-05-28
  Administered 2021-05-28: 40 mg via INTRAVENOUS
  Filled 2021-05-28: qty 10

## 2021-05-28 NOTE — H&P (Signed)
History and Physical    Patient: Peter [name] TMH:[id number] DOB: 05-18-1940 DOA: 05/28/2021 DOS: the patient was seen and examined on 05/29/2021 PCP: Tracie Harrier, MD  Patient coming from: Home  Chief Complaint:  Chief Complaint  Patient presents with   Chest Pain   Shortness of Breath    HPI: Peter [name] is a 81 y.o. male with medical history significant of heart disease, c/h anemia, dm ii, coming to [place] today with c/o chest pain.  Chart reviewed with the patient shows that he has been anemic since at least 2012 based on our chart but he denies any kind of evaluation.  States that he does not know why he is anemic and he has not seen any blood in his stool or he would have noticed it.  States his chest discomfort is been going on since Thanksgiving really. Per ED provider Case has been discussed with cardiologist Dr. Nehemiah Massed was recommended conservative medical management overnight and transfusion.  They will see patient in the morning.  Duration:since thanksgiving  and today am he took some imdure.  Frequency:intermittent.   Location:left side of chest.   Quality:tightness.   Rate: 8/10   Radiation: NR   Aggravating: activity.   Alleviating:resting   Associated factors:+ sob /   Review of Systems:  Review of Systems  Respiratory:  Positive for shortness of breath.   Cardiovascular:  Positive for chest pain.  All other systems reviewed and are negative.  Past Medical History:  Diagnosis Date   Anginal pain ([place])    Asthma    Bladder infection, acute 03/2011   "had a whole lot of bleeding from this"   CHF (congestive heart failure) ([place])    Coronary artery disease    High cholesterol    Hypertension    Macular degeneration 12/14/2011   "had it in my right; getting shots now in my left"   Myocardial infarction [facility]) 1996   NSTEMI (non-ST elevated myocardial infarction) ([place]) 12/14/2011   Shortness of breath 12/14/2011   "@ rest, lying down, w/exertion"    Past Surgical History:  Procedure Laterality Date   [place]; ~ 2003; ~ 2008   "total of 3"   CORONARY STENT INTERVENTION N/A 08/10/2016   Procedure: Coronary Stent Intervention;  Surgeon: [name], MD;  Location: [place] CV LAB;  Service: Cardiovascular;  Laterality: N/A;   HERNIA REPAIR  ~ 2001   "abdominal w/mesh implanted"   LEFT HEART CATH AND CORONARY ANGIOGRAPHY N/A 08/10/2016   Procedure: Left Heart Cath and Coronary Angiography;  Surgeon: [name], MD;  Location: [place] CV LAB;  Service: Cardiovascular;  Laterality: N/A;   LEFT HEART CATHETERIZATION WITH CORONARY ANGIOGRAM N/A 12/16/2011   Procedure: LEFT HEART CATHETERIZATION WITH CORONARY ANGIOGRAM;  Surgeon: Minus Breeding, MD;  Location: [facility] CATH LAB;  Service: Cardiovascular;  Laterality: N/A;   PERCUTANEOUS CORONARY STENT INTERVENTION (PCI-S) N/A 12/17/2011   Procedure: PERCUTANEOUS CORONARY STENT INTERVENTION (PCI-S);  Surgeon: Sherren Mocha, MD;  Location: [facility] CATH LAB;  Service: Cardiovascular;  Laterality: N/A;   TONSILLECTOMY     "I was a kid"   Social History:  reports that he has quit smoking. His smoking use included cigarettes. He has a 0.25 pack-year smoking history. He has never used smokeless tobacco. He reports current alcohol use of about 1.0 standard drink per week. He reports that he does not use drugs.  Allergies  Allergen Reactions   Iodinated Contrast Media Shortness Of  Breath   Iodinated Glycerol  [Glycerol, Iodinated] Shortness Of Breath    Family History  Family history unknown: Yes    Prior to Admission medications   Medication Sig Start Date End Date Taking? Authorizing Provider  amLODipine (NORVASC) 10 MG tablet Take 1 tablet (10 mg total) by mouth daily. 05/03/21 06/02/21  Val Riles, MD  atorvastatin (LIPITOR) 40 MG tablet Take 1 tablet (40 mg total) by mouth daily at 6 PM. 12/18/11   Annita Brod, MD  calcitRIOL  (ROCALTROL) 0.25 MCG capsule Take 0.25 mcg by mouth daily.    [provider]  carvedilol (COREG) 6.25 MG tablet Take 6.25 mg by mouth 2 (two) times daily with a meal.    [provider]  cetirizine (ZYRTEC) 10 MG tablet Take 10 mg by mouth.    [provider]  clobetasol cream (TEMOVATE) 2.09 % Apply 1 application topically 2 (two) times daily as needed (psoriasis).    [provider]  clopidogrel (PLAVIX) 75 MG tablet TAKE 1 TABLET(75 MG) BY MOUTH EVERY DAY 08/11/16   Fritzi Mandes, MD  cyanocobalamin 1000 MCG tablet Take 1,000 mcg by mouth daily.     [provider]  finasteride (PROSCAR) 5 MG tablet Take 1 tablet (5 mg total) by mouth daily. 02/22/20   Billey Co, MD  furosemide (LASIX) 40 MG tablet Take 1 tablet (40 mg total) by mouth daily. 05/02/21 06/01/21  Val Riles, MD  GLUCOSAMINE-CHONDROITIN-VIT C PO Take 1 tablet by mouth daily.    [provider]  hydrALAZINE (APRESOLINE) 100 MG tablet Take 1 tablet (100 mg total) by mouth every 8 (eight) hours. Patient taking differently: Take 100 mg by mouth every 12 (twelve) hours. 04/08/21 07/07/21  Enzo Bi, MD  isosorbide mononitrate (IMDUR) 60 MG 24 hr tablet Take 1 tablet (60 mg total) by mouth daily. 05/02/21 06/01/21  Val Riles, MD  omeprazole (PRILOSEC) 20 MG capsule Take 20 mg by mouth in the morning.    [provider]  spironolactone (ALDACTONE) 25 MG tablet Hold until followup with your outpatient doctor due to worsening kidney function. 04/08/21   Enzo Bi, MD  tamsulosin (FLOMAX) 0.4 MG CAPS capsule Take 1 capsule (0.4 mg total) by mouth daily. 02/22/20   Billey Co, MD    Physical Exam: Vitals:   05/28/21 2230 05/28/21 2300 05/28/21 2330 05/29/21 0000  BP: (!) 187/75 (!) 183/79 (!) 181/83 (!) 182/78  Pulse: 70 91 74 66  Resp: 13 18 19 16   Temp:      TempSrc:      SpO2: 100% 100% 99% 100%  Weight:      Height:      Physical Exam Vitals reviewed.   Constitutional:      General: He is not in acute distress.    Appearance: Normal appearance. He is not ill-appearing.  HENT:     Head: Normocephalic and atraumatic.     Right Ear: External ear normal.     Left Ear: External ear normal.     Nose: Nose normal.     Mouth/Throat:     Mouth: Mucous membranes are moist.  Eyes:     Extraocular Movements: Extraocular movements intact.  Neck:     Vascular: No carotid bruit.  Cardiovascular:     Rate and Rhythm: Normal rate and regular rhythm.     Pulses: Normal pulses.     Heart sounds: Normal heart sounds.  Pulmonary:     Effort: Pulmonary effort is  normal.     Breath sounds: Normal breath sounds.  Abdominal:     General: Bowel sounds are normal.     Palpations: Abdomen is soft.  Musculoskeletal:     Right lower leg: No edema.     Left lower leg: No edema.  Skin:    General: Skin is warm.  Neurological:     General: No focal deficit present.     Mental Status: He is alert and oriented to person, place, and time.  Psychiatric:        Mood and Affect: Mood normal.        Behavior: Behavior normal.     Data Reviewed: > CMP shows a glucose of 119 chronic kidney disease creatinine of 3.49 BUN 75 LFTs within normal limits. > BNP of 2842.5. > TNI 1235 with a repeat of 1328. > Anemia panel showing an iron of 38 and TIBC of 188 > CBC shows a white count of 7.3 hemoglobin of 7.7 and platelets of 130.. > CT angio of the chest: Abnormal results as below IMPRESSION: 1. Small but mildly worsened bilateral pleural effusions with associated passive atelectasis. 2. Airway thickening is present, suggesting bronchitis or reactive airways disease. 3. Several ground-glass density pulmonary nodules are present including two left apical nodule stable from 04/12/2021 (questionable precursors of these lesions on 12/14/2011) and a right upper lobe nodule which has increased in density compared to that time, as well as a stable right lower lobe  ground-glass density pulmonary nodule. Low-grade adenocarcinoma is a possibility and surveillance is recommended. 4. Aortic Atherosclerosis (ICD10-I70.0) and Emphysema (ICD10-J43.9). Coronary atherosclerosis. 5. 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 Recommend semi-annual imaging followup by CTA or MRA and referral to cardiothoracic surgery if not already obtained. This recommendation follows 2010 ACCF/AHA/AATS/ACR/ASA/SCA/SCAI/SIR/STS/SVM Guidelines for the Diagnosis and Management of Patients With Thoracic Aortic Disease. Circulation. 2010; 121: O536-U44. Aortic aneurysm NOS (ICD10-I71.9) 6. Coronary and abdominal aortic stents along with stents in the celiac trunk, SMA, and proximal renal arteries. AICD noted. 7. Small anterior pericardial effusion. 8. Cholelithiasis. 9. Partially imaged ventral hernia containing adipose tissue. > EKG shows: Rate of 69 rhythm left bundle branch block PVCs.   Assessment and Plan: No notes have been filed under this hospital service. Service: Hospitalist Chest Pain/ NSTEMI: We will admit pt to stepdown unit. Cont with asa 81 / statin.  Npo after midnight.  Telemetry unit.   SOB/ CAD/ AICD: Cardiology consult order placed and called by edmd.  Device interrogation. Echo.  PRN lasix if pt becomes sob his BNP is elevated.   Htn: Blood pressure (!) 182/78, pulse 66, temperature 98.3 F (36.8 C), temperature source Oral, resp. rate 16, height 5\' 10"  (1.778 m), weight 92.5 kg, SpO2 100 %. Monitor BP and give one time dose of lasix after one unit of blood.  GERD; IV PPI.  Iron panel reviewed suspect combination of c/h blood loss and acd.   Tobacco abuse: Nicotine patch.    Advance Care Planning:   Code Status: Full Code   Consults: cardiology: [email or web address] - per edmd.   Family Communication: Putnam,Robin (Daughter)  [phone] ([place])  Severity of Illness: The appropriate patient status for this patient is INPATIENT. Inpatient status  is judged to be reasonable and necessary in order to provide the required intensity of service to ensure the patient's safety. The patient's presenting symptoms, physical exam findings, and initial radiographic and laboratory data in the context of their chronic comorbidities is felt to place them at high risk  for further clinical deterioration. Furthermore, it is not anticipated that the patient will be medically stable for discharge from the hospital within 2 midnights of admission.   * I certify that at the point of admission it is my clinical judgment that the patient will require inpatient hospital care spanning beyond 2 midnights from the point of admission due to high intensity of service, high risk for further deterioration and high frequency of surveillance required.*  Author: Para Skeans, MD 05/29/2021 12:46 AM  For on call review [email or web address].

## 2021-05-28 NOTE — ED Triage Notes (Signed)
Pt presents to ED via EMS with c/o of CP that started at 1730, pt states SOB x2 days. Pt foes not usually wear O2 at home but is currently on 4L/min via [place]. Pt denies radiation of pain.    Hx of 6-7 stents.   EMS gave 3234 ASA PTA

## 2021-05-28 NOTE — ED Provider Notes (Signed)
[facility] Provider Note    Event Date/Time   First MD Initiated Contact with Patient 05/28/21 1846     (approximate)   History   Chest Pain and Shortness of Breath   HPI  Peter [name] is a 81 y.o. male who comes in with EMS for concerns for chest pain.  Patient reports some chest pain that started this morning.  Took some isosorbide I tried and initially got better but then had another episode.  He states that he sprays with Dr. Clayborn Bigness when he called him and he told him to come to the ER to be evaluated.  He does have a history of 6-7 stents.  Patient reports that he has been working with the pulmonologist about wanting to have oxygen at home but that he has not met requirements.  Sounds like his shortness of breath is maybe a little bit worse.  Does report episode of dark stool.  Patient is on Plavix.    Physical Exam   Triage Vital Signs: ED Triage Vitals  Enc Vitals Group     BP 05/28/21 1833 (!) 194/86     Pulse Rate 05/28/21 1833 75     Resp 05/28/21 1833 20     Temp 05/28/21 1833 98.3 F (36.8 C)     Temp Source 05/28/21 1833 Oral     SpO2 05/28/21 1833 100 %     Weight 05/28/21 1836 204 lb (92.5 kg)     Height 05/28/21 1836 5' 10" (1.778 m)     Head Circumference --      Peak Flow --      Pain Score 05/28/21 1836 1     Pain Loc --      Pain Edu? --      Excl. in [place]? --     Most recent vital signs: Vitals:   05/28/21 1833  BP: (!) 194/86  Pulse: 75  Resp: 20  Temp: 98.3 F (36.8 C)  SpO2: 100%     General: Awake, no distress.  CV:  Good peripheral perfusion.  Resp:  Normal effort.  Abd:  No distention.  Soft and nontender Other: Trace edema bilaterally Stool was dark, Hemoccult positive   ED Results / Procedures / Treatments   Labs (all labs ordered are listed, but only abnormal results are displayed) Labs Reviewed  BASIC METABOLIC PANEL - Abnormal; Notable for the following components:      Result Value    Chloride 113 (*)    Glucose, Bld 119 (*)    BUN 75 (*)    Creatinine, Ser 3.49 (*)    Calcium 8.6 (*)    GFR, Estimated 17 (*)    All other components within normal limits  CBC - Abnormal; Notable for the following components:   RBC 2.60 (*)    Hemoglobin 7.7 (*)    HCT 24.1 (*)    Platelets 130 (*)    All other components within normal limits  BRAIN NATRIURETIC PEPTIDE - Abnormal; Notable for the following components:   B Natriuretic Peptide 2,842.5 (*)    All other components within normal limits  HEPATIC FUNCTION PANEL - Abnormal; Notable for the following components:   Total Protein 5.2 (*)    Albumin 3.0 (*)    All other components within normal limits  TROPONIN I (HIGH SENSITIVITY) - Abnormal; Notable for the following components:   Troponin I (High Sensitivity) 1,235 (*)    All other components within normal  limits  RESP PANEL BY RT-PCR (FLU A&B, COVID) [id number]     EKG  My interpretation of EKG: Difficult to see if p wave rate of 69 with a left bundle branch block without any ST elevation but T wave versions in V5 and V6 with occasional PVCs  Repeat EKG looks more sinus with a rate of 83 with out any ST elevation, T wave inversions in V5 V6 1 and aVL with PVC and left bundle branch block    RADIOLOGY I have reviewed the xray personally and no PNA or edema    PROCEDURES:  Critical Care performed: Yes, see critical care procedure note(s)  .Critical Care Performed by: Vanessa [place], MD Authorized by: Vanessa [place], MD   Critical care provider statement:    Critical care time (minutes):  30   Critical care was necessary to treat or prevent imminent or life-threatening deterioration of the following conditions:  Cardiac failure   Critical care was time spent personally by me on the following activities:  Development of treatment plan with patient or surrogate, discussions with consultants, evaluation of patient's response to treatment, examination of patient,  ordering and review of laboratory studies, ordering and review of radiographic studies, ordering and performing treatments and interventions, pulse oximetry, re-evaluation of patient's condition and review of old charts .1-3 Lead EKG Interpretation Performed by: Vanessa [place], MD Authorized by: Vanessa [place], MD     Interpretation: normal     ECG rate:  90   ECG rate assessment: normal     Rhythm: sinus rhythm     Ectopy: none     Conduction: normal     MEDICATIONS ORDERED IN ED: Medications  0.9 %  sodium chloride infusion (has no administration in time range)  pantoprazole (PROTONIX) injection 40 mg (has no administration in time range)     IMPRESSION / MDM / ASSESSMENT AND PLAN / ED COURSE  I reviewed the triage vital signs and the nursing notes.  Patient with known cardiac history who comes in with concerns for chest pain.  I am concerned about ACS versus anemia.  Considered COVID, flu, pulmonary edema, PE as well chest pain is now at 0.  Initial EKG without evidence of STEMI.  However troponin was significantly elevated at 12,035  Patient's kidney function is elevated but similar to his prior at 3.4 CBC shows hemoglobin that is down from 11.55-monthago down to 7.7.  I did perform a rectal exam and the stool did look a little dark and was Hemoccult positive  His BNP was similar to 1 month ago  I am concerned that patient could be having a GI bleed that is causing demand ischemia versus having ACS.  Will discuss with cardiology for their further recommendations.  [facility] he recommends medical management with beta-blockers, nitrates for chest pain, 02  but hold off on heparin.  I considered the possibility of PE but it seems less likely unable to do CT PE.  He has no leg swelling or evidence of DVTs on examination.  I am not sure if he ever was truly hypoxic given we have no recorded sat with EMS but we will keep him on the oxygen for now given  the anemia and to help prevent the chest discomfort and they can decide this needs further work-up once patient's hemoglobin is more stable  The patient is on the cardiac monitor to evaluate for evidence of arrhythmia and/or significant heart  rate changes.    FINAL CLINICAL IMPRESSION(S) / ED DIAGNOSES   Final diagnoses:  Anemia, unspecified type  NSTEMI (non-ST elevated myocardial infarction) ([place])     Rx / DC Orders   ED Discharge Orders     None        Note:  This document was prepared using Dragon voice recognition software and may include unintentional dictation errors.   Vanessa [place], MD 05/28/21 2023

## 2021-05-28 NOTE — ED Notes (Signed)
Critical Troponin--1235, Dr. Jari Pigg notified and aware

## 2021-05-29 ENCOUNTER — Encounter: Payer: Self-pay | Admitting: Internal Medicine

## 2021-05-29 ENCOUNTER — Inpatient Hospital Stay
Admit: 2021-05-29 | Discharge: 2021-05-29 | Disposition: A | Discharge status: Home / Self Care | Payer: PPO | Attending: Internal Medicine | Admitting: Internal Medicine

## 2021-05-29 ENCOUNTER — Other Ambulatory Visit: Payer: Self-pay

## 2021-05-29 DIAGNOSIS — R079 Chest pain, unspecified: Secondary | ICD-10-CM | POA: Diagnosis not present

## 2021-05-29 LAB — ECHOCARDIOGRAM COMPLETE
AR max vel: 2.61 cm2
AV Area VTI: 2.27 cm2
AV Area mean vel: 2.33 cm2
AV Mean grad: 11 mmHg
AV Peak grad: 19 mmHg
Ao pk vel: 2.18 m/s
Area-P 1/2: 3.44 cm2
Height: 70 in
MV VTI: 3.32 cm2
S' Lateral: 4.07 cm
Weight: 3264 oz

## 2021-05-29 LAB — BASIC METABOLIC PANEL
Anion gap: 8 (ref 5–15)
BUN: 75 mg/dL — ABNORMAL HIGH (ref 8–23)
CO2: 23 mmol/L (ref 22–32)
Calcium: 8.6 mg/dL — ABNORMAL LOW (ref 8.9–10.3)
Chloride: 112 mmol/L — ABNORMAL HIGH (ref 98–111)
Creatinine, Ser: 3.47 mg/dL — ABNORMAL HIGH (ref 0.61–1.24)
GFR, Estimated: 17 mL/min — ABNORMAL LOW (ref >60)
Glucose, Bld: 102 mg/dL — ABNORMAL HIGH (ref 70–99)
Potassium: 3.4 mmol/L — ABNORMAL LOW (ref 3.5–5.1)
Sodium: 143 mmol/L (ref 135–145)

## 2021-05-29 LAB — CBC
HCT: 26.2 % — ABNORMAL LOW (ref 39.0–52.0)
Hemoglobin: 8.4 g/dL — ABNORMAL LOW (ref 13.0–17.0)
MCH: 28.9 pg (ref 26.0–34.0)
MCHC: 32.1 g/dL (ref 30.0–36.0)
MCV: 90 fL (ref 80.0–100.0)
Platelets: 130 10*3/uL — ABNORMAL LOW (ref 150–400)
RBC: 2.91 MIL/uL — ABNORMAL LOW (ref 4.22–5.81)
RDW: 14.3 % (ref 11.5–15.5)
WBC: 6.9 10*3/uL (ref 4.0–10.5)
nRBC: 0 % (ref 0.0–0.2)

## 2021-05-29 LAB — TROPONIN I (HIGH SENSITIVITY)
Troponin I (High Sensitivity): 1229 ng/L (ref <18)
Troponin I (High Sensitivity): 1696 ng/L (ref <18)

## 2021-05-29 LAB — LIPID PANEL
Cholesterol: 110 mg/dL (ref 0–200)
HDL: 29 mg/dL — ABNORMAL LOW (ref >40)
LDL Cholesterol: 59 mg/dL (ref 0–99)
Total CHOL/HDL Ratio: 3.8 RATIO
Triglycerides: 110 mg/dL (ref <150)
VLDL: 22 mg/dL (ref 0–40)

## 2021-05-29 LAB — MAGNESIUM: Magnesium: 2 mg/dL (ref 1.7–2.4)

## 2021-05-29 LAB — PREPARE RBC (CROSSMATCH)

## 2021-05-29 MED ORDER — CARVEDILOL 6.25 MG PO TABS
6.2500 mg | ORAL_TABLET | Freq: Two times a day (BID) | ORAL | Status: DC
  Administered 2021-05-29 – 2021-05-30 (×4): 6.25 mg via ORAL
  Filled 2021-05-29 (×4): qty 1

## 2021-05-29 MED ORDER — FUROSEMIDE 10 MG/ML IJ SOLN
20.0000 mg | Freq: Once | INTRAMUSCULAR | Status: AC
  Administered 2021-05-29: 20 mg via INTRAVENOUS
  Filled 2021-05-29: qty 4

## 2021-05-29 MED ORDER — NITROGLYCERIN 0.4 MG SL SUBL: 0.4000 mg | SUBLINGUAL_TABLET | SUBLINGUAL | Status: DC | PRN

## 2021-05-29 MED ORDER — CHLORHEXIDINE GLUCONATE CLOTH 2 % EX PADS
6.0000 | MEDICATED_PAD | Freq: Every day | CUTANEOUS | Status: DC
  Administered 2021-05-29 – 2021-06-03 (×5): 6 via TOPICAL

## 2021-05-29 MED ORDER — LABETALOL HCL 5 MG/ML IV SOLN: 10.0000 mg | INTRAVENOUS | Status: DC | PRN

## 2021-05-29 MED ORDER — TAMSULOSIN HCL 0.4 MG PO CAPS
0.4000 mg | ORAL_CAPSULE | Freq: Every day | ORAL | Status: DC
  Administered 2021-05-29 – 2021-06-04 (×7): 0.4 mg via ORAL
  Filled 2021-05-29 (×7): qty 1

## 2021-05-29 MED ORDER — HYDRALAZINE HCL 50 MG PO TABS
100.0000 mg | ORAL_TABLET | Freq: Two times a day (BID) | ORAL | Status: DC
  Administered 2021-05-29 – 2021-05-30 (×3): 100 mg via ORAL
  Filled 2021-05-29 (×3): qty 2

## 2021-05-29 MED ORDER — CITALOPRAM HYDROBROMIDE 20 MG PO TABS
20.0000 mg | ORAL_TABLET | Freq: Every day | ORAL | Status: DC
  Administered 2021-05-29 – 2021-06-04 (×7): 20 mg via ORAL
  Filled 2021-05-29 (×7): qty 1

## 2021-05-29 MED ORDER — AMLODIPINE BESYLATE 10 MG PO TABS
10.0000 mg | ORAL_TABLET | Freq: Every day | ORAL | Status: DC
  Administered 2021-05-29 – 2021-06-04 (×7): 10 mg via ORAL
  Filled 2021-05-29 (×3): qty 1
  Filled 2021-05-29: qty 2
  Filled 2021-05-29 (×3): qty 1

## 2021-05-29 MED ORDER — POTASSIUM CHLORIDE CRYS ER 20 MEQ PO TBCR
40.0000 meq | EXTENDED_RELEASE_TABLET | Freq: Once | ORAL | Status: AC
  Administered 2021-05-29: 40 meq via ORAL
  Filled 2021-05-29: qty 2

## 2021-05-29 MED ORDER — FUROSEMIDE 10 MG/ML IJ SOLN
80.0000 mg | Freq: Once | INTRAMUSCULAR | Status: AC
  Administered 2021-05-29: 80 mg via INTRAVENOUS
  Filled 2021-05-29: qty 8

## 2021-05-29 MED ORDER — FINASTERIDE 5 MG PO TABS
5.0000 mg | ORAL_TABLET | Freq: Every day | ORAL | Status: DC
  Administered 2021-05-30 – 2021-06-04 (×6): 5 mg via ORAL
  Filled 2021-05-29 (×6): qty 1

## 2021-05-29 MED ORDER — PANTOPRAZOLE SODIUM 40 MG IV SOLR
40.0000 mg | Freq: Two times a day (BID) | INTRAVENOUS | Status: DC
Start: 2021-05-29 — End: 2021-06-01
  Administered 2021-05-29 – 2021-06-01 (×6): 40 mg via INTRAVENOUS
  Filled 2021-05-29 (×6): qty 10

## 2021-05-29 MED ORDER — ISOSORBIDE MONONITRATE ER 30 MG PO TB24
30.0000 mg | ORAL_TABLET | Freq: Every day | ORAL | Status: DC
Start: 2021-05-29 — End: 2021-06-04
  Administered 2021-05-29 – 2021-06-04 (×7): 30 mg via ORAL
  Filled 2021-05-29 (×7): qty 1

## 2021-05-29 MED ORDER — PERFLUTREN LIPID MICROSPHERE
1.0000 mL | INTRAVENOUS | Status: AC | PRN
  Administered 2021-05-29: 3 mL via INTRAVENOUS
  Filled 2021-05-29: qty 10

## 2021-05-29 MED ORDER — HYDRALAZINE HCL 20 MG/ML IJ SOLN: 20.0000 mg | Freq: Four times a day (QID) | INTRAMUSCULAR | Status: DC | PRN

## 2021-05-29 MED ORDER — HEPARIN SODIUM (PORCINE) 5000 UNIT/ML IJ SOLN
5000.0000 [IU] | Freq: Two times a day (BID) | INTRAMUSCULAR | Status: DC
  Administered 2021-05-29: 5000 [IU] via SUBCUTANEOUS
  Filled 2021-05-29 (×2): qty 1

## 2021-05-29 MED ORDER — PANTOPRAZOLE SODIUM 40 MG PO TBEC: 40.0000 mg | DELAYED_RELEASE_TABLET | Freq: Every day | ORAL | Status: DC

## 2021-05-29 NOTE — ED Notes (Signed)
Verified with Dr. Billie Ruddy re: PRBC ordered, how many units to give. Per MD, "1 unit is good for now".

## 2021-05-29 NOTE — Progress Notes (Signed)
PROGRESS NOTE    SHAHAB POLHAMUS  NTZ:[id number] DOB: [birth year] DOA: 05/28/2021 PCP: Tracie Harrier, MD  [id number]   Assessment & Plan:   Principal Problem:   Chest pain Active Problems:   SOB (shortness of breath)   CAD (coronary artery disease)   HTN (hypertension)   GERD (gastroesophageal reflux disease)   NSTEMI (non-ST elevated myocardial infarction) ([place])   Current tobacco use   S/P ICD (internal cardiac defibrillator) procedure   Anemia    Peter [name] is a 81 y.o.  male with medical history significant for systolic and diastolic heart failure, CAD s/p multiple stents, hypertension, status post AICD who presented with chest pain and dyspnea.     # Chest pain # Hx of CAD s/p stents --trop up to 1600's.  Per cardio, Dr. Nehemiah Massed, avoided heparin gtt or anti-plt Plan: --no cath planned due to severe CKD --Continue isosorbide amlodipine carvedilol combination for hypertension control and risk reduction of cardiovascular disease with concerns of heart failure  # acute on chronic anemia --Hgb 7.7 on presentation, baseline 10-11 from a month ago.  Pt reported 2 episodes of black stools.  No prior GI workup.  Took plavix at home. --s/p 1u pRBC with appropriate Hgb increase to 8.4 Plan: --GI consult --Monitor Hgb and transfuse to keep Hgb >8 given CAD  # Acute urinary retention --Had prior hx of urinary retention, but current episode occurred after presentation. --Had 700 ml in bladder scan x2, so Foley inserted. Plan: --cont Foley --cont home finasteride and Flomax  # Hematuria --occurred only after I/O cath, in the setting of taking plavix at home. Plan: --hold plavix for now   DVT prophylaxis: Heparin SQ Code Status: Full code  Family Communication: significant other updated at bedside today  Level of care: Progressive Dispo:   The patient is from: home Anticipated d/c is to: home Anticipated d/c date is: 2-3 days Patient currently is not  medically ready to d/c due to: cardiology and GI workup   Subjective and Interval History:  Pt complained of dyspnea.  No cough.  Had urinary retention requiring foley placement.  Had hematuria due to trauma from cath in the setting of plavix.   Objective: Vitals:   05/29/21 0829 05/29/21 1102 05/29/21 1633 05/29/21 1921  BP: (!) 166/66 (!) 175/80 (!) 189/63 (!) 162/69  Pulse: 75 72 62 (!) 57  Resp: 19 18 17 16   Temp: 98.1 F (36.7 C) 98.2 F (36.8 C) 98 F (36.7 C) 98.5 F (36.9 C)  TempSrc:      SpO2: 98% 97% 99% 99%  Weight:      Height:        Intake/Output Summary (Last 24 hours) at 05/29/2021 1932 Last data filed at 05/29/2021 1717 Gross per 24 hour  Intake 2293.33 ml  Output 3900 ml  Net -1606.67 ml   Filed Weights   05/28/21 1836  Weight: 92.5 kg    Examination:   Constitutional: NAD, AAOx3 HEENT: conjunctivae and lids normal, EOMI CV: No cyanosis.   RESP: normal respiratory effort, on 2L SKIN: warm, dry Neuro: II - XII grossly intact.   Psych: depressed mood and affect.  Appropriate judgement and reason Foley present draining bloody urine   Data Reviewed: I have personally reviewed following labs and imaging studies  CBC: Recent Labs  Lab 05/28/21 1835 05/29/21 1023  WBC 7.3 6.9  HGB 7.7* 8.4*  HCT 24.1* 26.2*  MCV 92.7 90.0  PLT 130* 675*   Basic Metabolic Panel:  Recent Labs  Lab 05/28/21 1835 05/29/21 1023  NA 143 143  K 3.7 3.4*  CL 113* 112*  CO2 24 23  GLUCOSE 119* 102*  BUN 75* 75*  CREATININE 3.49* 3.47*  CALCIUM 8.6* 8.6*  MG  --  2.0   GFR: Estimated Creatinine Clearance: 19.1 mL/min (A) (by C-G formula based on SCr of 3.47 mg/dL (H)). Liver Function Tests: Recent Labs  Lab 05/28/21 1835  AST 16  ALT 15  ALKPHOS 75  BILITOT 0.6  PROT 5.2*  ALBUMIN 3.0*   No results for input(s): LIPASE, AMYLASE in the last 168 hours. No results for input(s): AMMONIA in the last 168 hours. Coagulation Profile: Recent Labs  Lab  05/28/21 2109  INR 1.3*   Cardiac Enzymes: No results for input(s): CKTOTAL, CKMB, CKMBINDEX, TROPONINI in the last 168 hours. BNP (last 3 results) No results for input(s): PROBNP in the last 8760 hours. HbA1C: No results for input(s): HGBA1C in the last 72 hours. CBG: No results for input(s): GLUCAP in the last 168 hours. Lipid Profile: Recent Labs    05/29/21 0838  CHOL 110  HDL 29*  LDLCALC 59  TRIG 110  CHOLHDL 3.8   Thyroid Function Tests: No results for input(s): TSH, T4TOTAL, FREET4, T3FREE, THYROIDAB in the last 72 hours. Anemia Panel: Recent Labs    05/28/21 2109  FERRITIN 111  TIBC 188*  IRON 38*  RETICCTPCT 2.0   Sepsis Labs: No results for input(s): PROCALCITON, LATICACIDVEN in the last 168 hours.  Recent Results (from the past 240 hour(s))  Resp Panel by RT-PCR (Flu A&B, Covid) Nasopharyngeal Swab     Status: None   Collection Time: 05/28/21  7:10 PM   Specimen: Nasopharyngeal Swab; Nasopharyngeal(NP) swabs in vial transport medium  Result Value Ref Range Status   SARS Coronavirus 2 by RT PCR NEGATIVE NEGATIVE Final    Comment: (NOTE) SARS-CoV-2 target nucleic acids are NOT DETECTED.  The SARS-CoV-2 RNA is generally detectable in upper respiratory specimens during the acute phase of infection. The lowest concentration of SARS-CoV-2 viral copies this assay can detect is 138 copies/mL. A negative result does not preclude SARS-Cov-2 infection and should not be used as the sole basis for treatment or other patient management decisions. A negative result may occur with  improper specimen collection/handling, submission of specimen other than nasopharyngeal swab, presence of viral mutation(s) within the areas targeted by this assay, and inadequate number of viral copies(<138 copies/mL). A negative result must be combined with clinical observations, patient history, and epidemiological information. The expected result is Negative.  Fact Sheet for  Patients:  EntrepreneurPulse.com.au  Fact Sheet for Healthcare Providers:  IncredibleEmployment.be  This test is no t yet approved or cleared by the [place] FDA and  has been authorized for detection and/or diagnosis of SARS-CoV-2 by FDA under an Emergency Use Authorization (EUA). This EUA will remain  in effect (meaning this test can be used) for the duration of the COVID-19 declaration under Section 564(b)(1) of the Act, 21 U.S.C.section 360bbb-3(b)(1), unless the authorization is terminated  or revoked sooner.       Influenza A by PCR NEGATIVE NEGATIVE Final   Influenza B by PCR NEGATIVE NEGATIVE Final    Comment: (NOTE) The Xpert Xpress SARS-CoV-2/FLU/RSV plus assay is intended as an aid in the diagnosis of influenza from Nasopharyngeal swab specimens and should not be used as a sole basis for treatment. Nasal washings and aspirates are unacceptable for Xpert Xpress SARS-CoV-2/FLU/RSV testing.  Fact Sheet  for Patients: EntrepreneurPulse.com.au  Fact Sheet for Healthcare Providers: IncredibleEmployment.be  This test is not yet approved or cleared by the [place] FDA and has been authorized for detection and/or diagnosis of SARS-CoV-2 by FDA under an Emergency Use Authorization (EUA). This EUA will remain in effect (meaning this test can be used) for the duration of the COVID-19 declaration under Section 564(b)(1) of the Act, 21 U.S.C. section 360bbb-3(b)(1), unless the authorization is terminated or revoked.  Performed at [facility], [address] [place],  [address]       Radiology Studies: DG Chest Portable 1 View  Result Date: 05/28/2021 CLINICAL DATA:  Shortness of breath, chest pain EXAM: PORTABLE CHEST 1 VIEW COMPARISON:  05/01/2021 FINDINGS: Lungs are clear.  No pleural effusion or pneumothorax. The heart is normal in size.  Left subclavian ICD. Mild  degenerative changes of the mid thoracic spine. IMPRESSION: No evidence of acute cardiopulmonary disease. Electronically Signed   By: Julian Hy M.D.   On: 05/28/2021 19:04   ECHOCARDIOGRAM COMPLETE  Result Date: 05/29/2021    ECHOCARDIOGRAM REPORT   Patient Name:   BRADIN MCADORY Date of Exam: 05/29/2021 Medical Rec #:  [id number]        Height:       70.0 in Accession #:    [id number]       Weight:       204.0 lb Date of Birth:  12-17-1940       BSA:          2.105 m Patient Age:    81 years         BP:           166/66 mmHg Patient Gender: M                HR:           62 bpm. Exam Location:  [facility] Procedure: 2D Echo, Color Doppler, Cardiac Doppler and Intracardiac            Opacification Agent Indications:     R07.9 Chest Pain  History:         Patient has prior history of Echocardiogram examinations, most                  recent 04/04/2021. CHF, CAD, Pacemaker; Risk Factors:HCL.  Sonographer:     Charmayne Sheer Referring Phys:  [place] Diagnosing Phys: Serafina Royals MD  Sonographer Comments: Technically difficult study due to poor echo windows. Image acquisition challenging due to respiratory motion. IMPRESSIONS  1. Left ventricular ejection fraction, by estimation, is 40 to 45%. The left ventricle has mildly decreased function. The left ventricle demonstrates global hypokinesis. The left ventricular internal cavity size was mildly dilated. Left ventricular diastolic parameters were normal.  2. Right ventricular systolic function is normal. The right ventricular size is normal.  3. Left atrial size was mildly dilated.  4. Right atrial size was mildly dilated.  5. The mitral valve is normal in structure. Mild to moderate mitral valve regurgitation.  6. Tricuspid valve regurgitation is mild to moderate.  7. The aortic valve is normal in structure. Aortic valve regurgitation is not visualized. FINDINGS  Left Ventricle: Left ventricular ejection fraction, by estimation, is 40 to 45%. The left  ventricle has mildly decreased function. The left ventricle demonstrates global hypokinesis. Definity contrast agent was given IV to delineate the left ventricular  endocardial borders. The left ventricular internal cavity size was mildly dilated. There  is no left ventricular hypertrophy. Left ventricular diastolic parameters were normal. Right Ventricle: The right ventricular size is normal. No increase in right ventricular wall thickness. Right ventricular systolic function is normal. Left Atrium: Left atrial size was mildly dilated. Right Atrium: Right atrial size was mildly dilated. Pericardium: There is no evidence of pericardial effusion. Mitral Valve: The mitral valve is normal in structure. Mild to moderate mitral valve regurgitation. MV peak gradient, 6.6 mmHg. The mean mitral valve gradient is 4.0 mmHg. Tricuspid Valve: The tricuspid valve is normal in structure. Tricuspid valve regurgitation is mild to moderate. Aortic Valve: The aortic valve is normal in structure. Aortic valve regurgitation is not visualized. Aortic valve mean gradient measures 11.0 mmHg. Aortic valve peak gradient measures 19.0 mmHg. Aortic valve area, by VTI measures 2.27 cm. Pulmonic Valve: The pulmonic valve was normal in structure. Pulmonic valve regurgitation is trivial. Aorta: The aortic root and ascending aorta are structurally normal, with no evidence of dilitation. IAS/Shunts: No atrial level shunt detected by color flow Doppler.  LEFT VENTRICLE PLAX 2D LVIDd:         5.23 cm   Diastology LVIDs:         4.07 cm   LV e' medial:    5.33 cm/s LV PW:         1.39 cm   LV E/e' medial:  18.0 LV IVS:        1.11 cm   LV e' lateral:   5.66 cm/s LVOT diam:     2.50 cm   LV E/e' lateral: 16.9 LV SV:         116 LV SV Index:   55 LVOT Area:     4.91 cm  RIGHT VENTRICLE RV Basal diam:  4.45 cm LEFT ATRIUM           Index        RIGHT ATRIUM           Index LA diam:      3.70 cm 1.76 cm/m   RA Area:     17.10 cm LA Vol (A4C): 73.3 ml  34.82 ml/m  RA Volume:   40.00 ml  19.00 ml/m  AORTIC VALVE                     PULMONIC VALVE AV Area (Vmax):    2.61 cm      PV Vmax:       0.87 m/s AV Area (Vmean):   2.33 cm      PV Vmean:      57.000 cm/s AV Area (VTI):     2.27 cm      PV VTI:        0.157 m AV Vmax:           218.00 cm/s   PV Peak grad:  3.0 mmHg AV Vmean:          160.000 cm/s  PV Mean grad:  2.0 mmHg AV VTI:            0.513 m AV Peak Grad:      19.0 mmHg AV Mean Grad:      11.0 mmHg LVOT Vmax:         116.00 cm/s LVOT Vmean:        75.800 cm/s LVOT VTI:          0.237 m LVOT/AV VTI ratio: 0.46  AORTA Ao Root diam: 3.40 cm MITRAL VALVE  TRICUSPID VALVE MV Area (PHT): 3.44 cm     TR Peak grad:   43.6 mmHg MV Area VTI:   3.32 cm     TR Vmax:        330.00 cm/s MV Peak grad:  6.6 mmHg MV Mean grad:  4.0 mmHg     SHUNTS MV Vmax:       1.28 m/s     Systemic VTI:  0.24 m MV Vmean:      93.5 cm/s    Systemic Diam: 2.50 cm MV Decel Time: 221 msec MV E velocity: 95.75 cm/s MV A velocity: 106.00 cm/s MV E/A ratio:  0.90 Serafina Royals MD Electronically signed by Serafina Royals MD Signature Date/Time: 05/29/2021/12:28:40 PM    Final      Scheduled Meds:  amLODipine  10 mg Oral Daily   carvedilol  6.25 mg Oral BID WC   Chlorhexidine Gluconate Cloth  6 each Topical Daily   citalopram  20 mg Oral Daily   [START ON 05/30/2021] finasteride  5 mg Oral Daily   heparin  5,000 Units Subcutaneous Q12H   hydrALAZINE  100 mg Oral Q12H   isosorbide mononitrate  30 mg Oral Daily   pantoprazole (PROTONIX) IV  40 mg Intravenous Q12H   tamsulosin  0.4 mg Oral Daily   Continuous Infusions:   LOS: 1 day     Enzo Bi, MD [facility] If 7PM-7AM, please contact night-coverage 05/29/2021, 7:32 PM

## 2021-05-29 NOTE — TOC Initial Note (Signed)
Transition of Care [facility]) - Initial/Assessment Note    Patient Details  Name: HERCHEL HOPKIN MRN: [id number] Date of Birth: Aug 02, 1940  Transition of Care [facility] CM/SW Contact:    Alberteen Sam, LCSW Phone Number: 05/29/2021, 1:55 PM  Clinical Narrative:                  CSW attempted to complete readmission risk assessment with patient however he continued to fall asleep while this CSW was speaking.   CSW notes patient had a recent admission in which it was noted per  daughter [name] patient is set up at home with [facility] and an overnight caregiver.   Last admission she expressed interest in long term facility options for Alf, agreeable to receive information for [employer] referral for them to continue to follow up with her regarding any assistance they can provide.    TOC will continue to follow for needs.  Expected Discharge Plan: [place] Barriers to Discharge: Continued Medical Work up   Patient Goals and CMS Choice Patient states their goals for this hospitalization and ongoing recovery are:: to go home CMS Medicare.gov Compare Post Acute Care list provided to:: Patient Choice offered to / list presented to : Patient  Expected Discharge Plan and Services Expected Discharge Plan: [place]                                              Prior Living Arrangements/Services   Lives with:: Self   Do you feel safe going back to the place where you live?: Yes               Activities of Daily Living Home Assistive Devices/Equipment: Oxygen ADL Screening (condition at time of admission) Patient's cognitive ability adequate to safely complete daily activities?: Yes Is the patient deaf or have difficulty hearing?: No Does the patient have difficulty seeing, even when wearing glasses/contacts?: No Does the patient have difficulty concentrating, remembering, or making decisions?: No Patient able to express need for  assistance with ADLs?: Yes Does the patient have difficulty dressing or bathing?: No Independently performs ADLs?: Yes (appropriate for developmental age) Does the patient have difficulty walking or climbing stairs?: No Weakness of Legs: None Weakness of Arms/Hands: None  Permission Sought/Granted                  Emotional Assessment              Admission diagnosis:  NSTEMI (non-ST elevated myocardial infarction) ([place]) [I21.4] Chest pain [R07.9] Anemia, unspecified type [D64.9] Patient Active Problem List   Diagnosis Date Noted   Chest pain 05/28/2021   Acute exacerbation of CHF (congestive heart failure) ([place]) 04/27/2021   CKD (chronic kidney disease) stage 4, GFR 15-29 ml/min ([place]) 04/04/2021   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   S/P AAA (abdominal aortic aneurysm) repair 04/04/2021   Acute anemia 04/04/2021   Thrombocytopenia ([place]) 04/04/2021   ABLA (acute blood loss anemia) 04/04/2021   AKI (acute kidney injury) ([place]) 04/04/2021   Anemia    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 12/19/2018   CKD (chronic kidney disease) stage 3, GFR 30-59 ml/min ([place]) 05/18/2018   Elevated hemoglobin A1c 05/18/2018   History of skin cancer 05/18/2018   Ischemic cardiomyopathy 12/16/2017   S/P ICD (internal cardiac  defibrillator) procedure 12/16/2017   Acute on chronic systolic CHF (congestive heart failure) ([place]) 09/07/2016   Congestive heart failure ([place]) 01/17/2015   Current tobacco use 01/17/2015   Hypercholesterolemia 01/17/2015   Peripheral vascular disease ([place]) 01/17/2015   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   Arthritis, degenerative 07/22/2013   H/O angina pectoris 07/22/2013   Adiposity 07/22/2013   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   NSTEMI (non-ST elevated myocardial infarction) ([place]) 12/17/2011   Hypokalemia 12/15/2011   SOB (shortness of breath) 12/14/2011   CAD (coronary  artery disease) 12/14/2011   HTN (hypertension) 12/14/2011   Hyperlipidemia 12/14/2011   GERD (gastroesophageal reflux disease) 12/14/2011   BPH (benign prostatic hyperplasia) 12/14/2011   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/14/2011   Aneurysm of thoracic aorta 12/14/2011   PCP:  Tracie Harrier, MD Pharmacy:   [facility] [address] [place] [address] Phone: [phone] Fax: [phone]     Social Determinants of Health (SDOH) Interventions    Readmission Risk Interventions Readmission Risk Prevention Plan 04/28/2021  Transportation Screening Complete  PCP or Specialist Appt within 3-5 Days Complete  HRI or Home Care Consult Complete  Social Work Consult for [place] Planning/Counseling Complete  Palliative Care Screening Not Applicable  Medication Review [employer]) Complete  Some recent data might be hidden

## 2021-05-29 NOTE — Consult Note (Signed)
[facility] Consultation Note  Patient ID: Peter [name], MRN: [id number], DOB/AGE: 82-May-1942 82 y.o. Admit date: 05/28/2021   Date of Consult: 05/29/2021 Primary Physician: Tracie Harrier, MD Primary Cardiologist: [facility]  Chief Complaint:  Chief Complaint  Patient presents with   Chest Pain   Shortness of Breath   Reason for Consult:  Chest pain  HPI: 81 y.o. male with known dilated cardiomyopathy with congestive heart failure and severe LV systolic dysfunction status post pacemaker placement multiple medication management and CRT.  The patient has had known chronic kidney disease stage IV diabetes chronic obstructive pulmonary disease and hypertension.  He has come to the hospital for further significant concerns anemia and potential bleeding complications.  The patient has arrived with hemoglobin of 7.7 and has received blood but has not had a significant improvement of symptoms.  His chest pain has been intermittent but also has caused him to have an elevated troponin consistent with 1696/1229/1328 consistent with demand ischemia rather than acute coronary syndrome.  He has a BNP of 2842 consistent with congestive heart failure.  After further discussion with the emergency room was concerning for the chest discomfort although with the potential for bleeding complications we have avoided the heparin at this time as well as other antiplatelet medication management.  Additionally we have not considered the possibility of interventional treatment due to severe chronic kidney disease and anemia.  The patient now is much better on isosorbide 30 mg with good blood pressure control carvedilol and amlodipine.  Past Medical History:  Diagnosis Date   Anginal pain ([place])    Asthma    Bladder infection, acute 03/2011   "had a whole lot of bleeding from this"   CHF (congestive heart failure) ([place])    Coronary artery disease    High cholesterol    Hypertension    Macular  degeneration 12/14/2011   "had it in my right; getting shots now in my left"   Myocardial infarction [facility]) 1996   NSTEMI (non-ST elevated myocardial infarction) ([place]) 12/14/2011   Shortness of breath 12/14/2011   "@ rest, lying down, w/exertion"      Surgical History:  Past Surgical History:  Procedure Laterality Date   [place]; ~ 2003; ~ 2008   "total of 3"   CORONARY STENT INTERVENTION N/A 08/10/2016   Procedure: Coronary Stent Intervention;  Surgeon: [name], MD;  Location: [facility];  Service: Cardiovascular;  Laterality: N/A;   HERNIA REPAIR  ~ 2001   "abdominal w/mesh implanted"   LEFT HEART CATH AND CORONARY ANGIOGRAPHY N/A 08/10/2016   Procedure: Left Heart Cath and Coronary Angiography;  Surgeon: [name], MD;  Location: [facility];  Service: Cardiovascular;  Laterality: N/A;   LEFT HEART CATHETERIZATION WITH CORONARY ANGIOGRAM N/A 12/16/2011   Procedure: LEFT HEART CATHETERIZATION WITH CORONARY ANGIOGRAM;  Surgeon: Minus Breeding, MD;  Location: [facility] CATH LAB;  Service: Cardiovascular;  Laterality: N/A;   PERCUTANEOUS CORONARY STENT INTERVENTION (PCI-S) N/A 12/17/2011   Procedure: PERCUTANEOUS CORONARY STENT INTERVENTION (PCI-S);  Surgeon: Sherren Mocha, MD;  Location: [facility] CATH LAB;  Service: Cardiovascular;  Laterality: N/A;   TONSILLECTOMY     "I was a kid"     Home Meds: Prior to Admission medications   Medication Sig Start Date End Date Taking? Authorizing Provider  amLODipine (NORVASC) 10 MG tablet Take 1 tablet (10 mg total) by mouth daily. 05/03/21 06/02/21 Yes Val Riles, MD  atorvastatin (LIPITOR) 40 MG  tablet Take 1 tablet (40 mg total) by mouth daily at 6 PM. 12/18/11  Yes Annita Brod, MD  calcitRIOL (ROCALTROL) 0.25 MCG capsule Take 0.25 mcg by mouth daily.   Yes [provider]  carvedilol (COREG) 6.25 MG tablet Take 6.25 mg by mouth 2 (two) times daily with a meal.   Yes  [provider]  cetirizine (ZYRTEC) 10 MG tablet Take 10 mg by mouth.   Yes [provider]  citalopram (CELEXA) 20 MG tablet Take 20 mg by mouth daily. 05/22/21  Yes [provider]  clobetasol cream (TEMOVATE) 2.44 % Apply 1 application topically 2 (two) times daily as needed (psoriasis).   Yes [provider]  clopidogrel (PLAVIX) 75 MG tablet TAKE 1 TABLET(75 MG) BY MOUTH EVERY DAY 08/11/16  Yes Fritzi Mandes, MD  cyanocobalamin 1000 MCG tablet Take 1,000 mcg by mouth daily.    Yes [provider]  finasteride (PROSCAR) 5 MG tablet Take 1 tablet (5 mg total) by mouth daily. 02/22/20  Yes Billey Co, MD  furosemide (LASIX) 40 MG tablet Take 1 tablet (40 mg total) by mouth daily. 05/02/21 06/01/21 Yes Val Riles, MD  GLUCOSAMINE-CHONDROITIN-VIT C PO Take 1 tablet by mouth daily.   Yes [provider]  hydrALAZINE (APRESOLINE) 100 MG tablet Take 1 tablet (100 mg total) by mouth every 8 (eight) hours. Patient taking differently: Take 100 mg by mouth every 12 (twelve) hours. 04/08/21 07/07/21 Yes Enzo Bi, MD  isosorbide mononitrate (IMDUR) 30 MG 24 hr tablet Take 30 mg by mouth daily. 05/19/21  Yes [provider]  omeprazole (PRILOSEC) 20 MG capsule Take 20 mg by mouth in the morning.   Yes [provider]  tamsulosin (FLOMAX) 0.4 MG CAPS capsule Take 1 capsule (0.4 mg total) by mouth daily. 02/22/20  Yes Billey Co, MD  spironolactone (ALDACTONE) 25 MG tablet Hold until followup with your outpatient doctor due to worsening kidney function. Patient not taking: Reported on 05/28/2021 04/08/21   Enzo Bi, MD    Inpatient Medications:   amLODipine  10 mg Oral Daily   carvedilol  6.25 mg Oral BID WC   Chlorhexidine Gluconate Cloth  6 each Topical Daily   citalopram  20 mg Oral Daily   [START ON 05/30/2021] finasteride  5 mg Oral Daily   heparin  5,000 Units Subcutaneous Q12H   hydrALAZINE  100 mg Oral Q12H    isosorbide mononitrate  30 mg Oral Daily   pantoprazole (PROTONIX) IV  40 mg Intravenous Q12H   tamsulosin  0.4 mg Oral Daily     Allergies:  Allergies  Allergen Reactions   Iodinated Contrast Media Shortness Of Breath   Iodinated Glycerol  [Glycerol, Iodinated] Shortness Of Breath    Social History   Socioeconomic History   Marital status: Widowed    Spouse name: Not on file   Number of children: Not on file   Years of education: Not on file   Highest education level: Not on file  Occupational History   Not on file  Tobacco Use   Smoking status: Former    Packs/day: 0.50    Years: 0.50    Pack years: 0.25    Types: Cigarettes   Smokeless tobacco: Never   Tobacco comments:    11/23/16 Still not ready to quit smoking.  Substance and Sexual Activity   Alcohol use: Yes    Alcohol/week: 1.0 standard drink    Types: 1 Cans of beer per week  Comment: 12/14/2011 "if my kidneys are sore, I drink 1 beer/day; if not sore; no beer; occasionally drink socially; ave 1 beer/wk maybe"   Drug use: No   Sexual activity: Not Currently  Other Topics Concern   Not on file  Social History Narrative   Not on file   Social Determinants of Health   Financial Resource Strain: Not on file  Food Insecurity: Not on file  Transportation Needs: Not on file  Physical Activity: Not on file  Stress: Not on file  Social Connections: Not on file  Intimate Partner Violence: Not on file     Family History  Family history unknown: Yes     Review of Systems Positive for shortness of breath chest pain Negative for: General:  chills, fever, night sweats or weight changes.  Cardiovascular: PND orthopnea syncope dizziness  Dermatological skin lesions rashes Respiratory: Cough congestion Urologic: Frequent urination urination at night and hematuria Abdominal: negative for nausea, vomiting, diarrhea, bright red blood per rectum, melena, or hematemesis Neurologic: negative for visual changes,  and/or hearing changes  All other systems reviewed and are otherwise negative except as noted above.  Labs: No results for input(s): CKTOTAL, CKMB, TROPONINI in the last 72 hours. Lab Results  Component Value Date   WBC 6.9 05/29/2021   HGB 8.4 (L) 05/29/2021   HCT 26.2 (L) 05/29/2021   MCV 90.0 05/29/2021   PLT 130 (L) 05/29/2021    Recent Labs  Lab 05/28/21 1835 05/29/21 1023  NA 143 143  K 3.7 3.4*  CL 113* 112*  CO2 24 23  BUN 75* 75*  CREATININE 3.49* 3.47*  CALCIUM 8.6* 8.6*  PROT 5.2*  --   BILITOT 0.6  --   ALKPHOS 75  --   ALT 15  --   AST 16  --   GLUCOSE 119* 102*   Lab Results  Component Value Date   CHOL 110 05/29/2021   HDL 29 (L) 05/29/2021   LDLCALC 59 05/29/2021   TRIG 110 05/29/2021   Lab Results  Component Value Date   DDIMER 2.83 (H) 04/12/2021    Radiology/Studies:  CT CHEST WO CONTRAST  Result Date: 05/28/2021 CLINICAL DATA:  Shortness of breath. History of congestive heart failure. Substantial smoking history. EXAM: CT CHEST WITHOUT CONTRAST TECHNIQUE: Multidetector CT imaging of the chest was performed following the standard protocol without IV contrast. RADIATION DOSE REDUCTION: This exam was performed according to the departmental dose-optimization program which includes automated exposure control, adjustment of the mA and/or kV according to patient size and/or use of iterative reconstruction technique. COMPARISON:  Chest radiograph 05/01/2021 and chest CT 04/12/2021 FINDINGS: Cardiovascular: Coronary, aortic arch, and branch vessel atherosclerotic vascular disease. Coronary stents. AICD noted. 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 Small anterior pericardial effusion. Borderline cardiomegaly. Descending thoracic aortic and upper abdominal aortic stent. Mediastinum/Nodes: AP window lymph node 0.8 cm in short axis on image 62 series 2, stable. Low-density subcarinal lymph nodes  including a 1.0 cm node on image 89 series 2, formerly the same. Other scattered small mediastinal lymph nodes are present. Lungs/Pleura: Small bilateral pleural effusions although both are increased from 04/12/2021. Centrilobular emphysema. Mild secondary pulmonary lobular septal accentuation of the apices. Bilateral airway thickening. 1.0 by 0.6 cm stable ground-glass density nodule in the apicoposterior segment left upper lobe on image 22 series 3. Ground-glass density peribronchovascular nodule in the left upper lobe on image 33 series  3 is stable at 1.2 by 0.7 cm. Ground-glass density 1.7 by 1.0 cm right lower lobe nodule on image 72 series 3. Increased density of a 0.8 by 0.5 cm right upper lobe sub solid pulmonary nodule on image 62 series 3 compared to the 04/12/2021 exam. Mildly increased atelectasis in the left lower lobe compared to previous. Upper Abdomen: Small depending gallstones in the gallbladder. Upper abdominal aortic stent with stents in the proximal celiac trunk, SMA, and renal arteries. We partially image a ventral hernia containing adipose tissue on image 185 series 2. Musculoskeletal: Bilateral gynecomastia. 2.0 by 0.6 cm sclerotic lesion in the right posterolateral sixth rib, no change from 12/14/2011, considered benign. IMPRESSION: 1. Small but mildly worsened bilateral pleural effusions with associated passive atelectasis. 2. Airway thickening is present, suggesting bronchitis or reactive airways disease. 3. Several ground-glass density pulmonary nodules are present including two left apical nodule stable from 04/12/2021 (questionable precursors of these lesions on 12/14/2011) and a right upper lobe nodule which has increased in density compared to that time, as well as a stable right lower lobe ground-glass density pulmonary nodule. Low-grade adenocarcinoma is a possibility and surveillance is recommended. 4. Aortic Atherosclerosis (ICD10-I70.0) and Emphysema (ICD10-J43.9). Coronary  atherosclerosis. 5. 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 Recommend semi-annual imaging followup by CTA or MRA and referral to cardiothoracic surgery if not already obtained. This recommendation follows 2010 ACCF/AHA/AATS/ACR/ASA/SCA/SCAI/SIR/STS/SVM Guidelines for the Diagnosis and Management of Patients With Thoracic Aortic Disease. Circulation. 2010; 121: U981-X91. Aortic aneurysm NOS (ICD10-I71.9) 6. Coronary and abdominal aortic stents along with stents in the celiac trunk, SMA, and proximal renal arteries. AICD noted. 7. Small anterior pericardial effusion. 8. Cholelithiasis. 9. Partially imaged ventral hernia containing adipose tissue. Electronically Signed   By: Van Clines M.D.   On: 05/28/2021 08:44   NM Pulmonary Perfusion  Result Date: 05/01/2021 CLINICAL DATA:  Shortness of breath EXAM: NUCLEAR MEDICINE PERFUSION LUNG SCAN TECHNIQUE: Perfusion images were obtained in multiple projections after intravenous injection of radiopharmaceutical. Ventilation scans intentionally deferred if perfusion scan and chest x-ray adequate for interpretation during COVID 19 epidemic. RADIOPHARMACEUTICALS:  4.1 mCi Tc-40m MAA IV COMPARISON:  Same day chest radiograph FINDINGS: Heterogeneous perfusion of the lung with multiple small subsegmental perfusion defects. IMPRESSION: Nondiagnostic (low or intermediate probability) Electronically Signed   By: Yetta Glassman M.D.   On: 05/01/2021 14:48   DG Chest Portable 1 View  Result Date: 05/28/2021 CLINICAL DATA:  Shortness of breath, chest pain EXAM: PORTABLE CHEST 1 VIEW COMPARISON:  05/01/2021 FINDINGS: Lungs are clear.  No pleural effusion or pneumothorax. The heart is normal in size.  Left subclavian ICD. Mild degenerative changes of the mid thoracic spine. IMPRESSION: No evidence of acute cardiopulmonary disease. Electronically Signed   By: Julian Hy M.D.   On: 05/28/2021 19:04   DG Chest Port 1 View  Result Date: 05/01/2021 CLINICAL DATA:   Shortness of breath EXAM: PORTABLE CHEST 1 VIEW COMPARISON:  Previous studies including the examination of 04/27/2021 FINDINGS: Transverse diameter of heart is increased. Pacemaker battery is seen in the left infraclavicular region. Biventricular pacer leads are noted in place. Central pulmonary vessels are more prominent. Subtle increase in interstitial markings are seen in the lower lung fields, more so on the right side. Costophrenic angles are clear. There is no pneumothorax. IMPRESSION: Cardiomegaly. Central pulmonary vessels are prominent without signs of alveolar pulmonary edema. Increased interstitial markings are seen in the both lower lung fields, more so on the right side which may suggest  mild asymmetric interstitial pulmonary edema or interstitial pneumonia. Electronically Signed   By: Elmer Picker M.D.   On: 05/01/2021 13:30   ECHOCARDIOGRAM COMPLETE  Result Date: 05/29/2021    ECHOCARDIOGRAM REPORT   Patient Name:   Peter [name] Date of Exam: 05/29/2021 Medical Rec #:  [id number]        Height:       70.0 in Accession #:    [id number]       Weight:       204.0 lb Date of Birth:  July 23, 1940       BSA:          2.105 m Patient Age:    21 years         BP:           166/66 mmHg Patient Gender: M                HR:           62 bpm. Exam Location:  [facility] Procedure: 2D Echo, Color Doppler, Cardiac Doppler and Intracardiac            Opacification Agent Indications:     R07.9 Chest Pain  History:         Patient has prior history of Echocardiogram examinations, most                  recent 04/04/2021. CHF, CAD, Pacemaker; Risk Factors:HCL.  Sonographer:     Charmayne Sheer Referring Phys:  [place] Diagnosing Phys: Serafina Royals MD  Sonographer Comments: Technically difficult study due to poor echo windows. Image acquisition challenging due to respiratory motion. IMPRESSIONS  1. Left ventricular ejection fraction, by estimation, is 40 to 45%. The left ventricle has mildly decreased  function. The left ventricle demonstrates global hypokinesis. The left ventricular internal cavity size was mildly dilated. Left ventricular diastolic parameters were normal.  2. Right ventricular systolic function is normal. The right ventricular size is normal.  3. Left atrial size was mildly dilated.  4. Right atrial size was mildly dilated.  5. The mitral valve is normal in structure. Mild to moderate mitral valve regurgitation.  6. Tricuspid valve regurgitation is mild to moderate.  7. The aortic valve is normal in structure. Aortic valve regurgitation is not visualized. FINDINGS  Left Ventricle: Left ventricular ejection fraction, by estimation, is 40 to 45%. The left ventricle has mildly decreased function. The left ventricle demonstrates global hypokinesis. Definity contrast agent was given IV to delineate the left ventricular  endocardial borders. The left ventricular internal cavity size was mildly dilated. There is no left ventricular hypertrophy. Left ventricular diastolic parameters were normal. Right Ventricle: The right ventricular size is normal. No increase in right ventricular wall thickness. Right ventricular systolic function is normal. Left Atrium: Left atrial size was mildly dilated. Right Atrium: Right atrial size was mildly dilated. Pericardium: There is no evidence of pericardial effusion. Mitral Valve: The mitral valve is normal in structure. Mild to moderate mitral valve regurgitation. MV peak gradient, 6.6 mmHg. The mean mitral valve gradient is 4.0 mmHg. Tricuspid Valve: The tricuspid valve is normal in structure. Tricuspid valve regurgitation is mild to moderate. Aortic Valve: The aortic valve is normal in structure. Aortic valve regurgitation is not visualized. Aortic valve mean gradient measures 11.0 mmHg. Aortic valve peak gradient measures 19.0 mmHg. Aortic valve area, by VTI measures 2.27 cm. Pulmonic Valve: The pulmonic valve was normal in structure. Pulmonic valve regurgitation  is trivial.  Aorta: The aortic root and ascending aorta are structurally normal, with no evidence of dilitation. IAS/Shunts: No atrial level shunt detected by color flow Doppler.  LEFT VENTRICLE PLAX 2D LVIDd:         5.23 cm   Diastology LVIDs:         4.07 cm   LV e' medial:    5.33 cm/s LV PW:         1.39 cm   LV E/e' medial:  18.0 LV IVS:        1.11 cm   LV e' lateral:   5.66 cm/s LVOT diam:     2.50 cm   LV E/e' lateral: 16.9 LV SV:         116 LV SV Index:   55 LVOT Area:     4.91 cm  RIGHT VENTRICLE RV Basal diam:  4.45 cm LEFT ATRIUM           Index        RIGHT ATRIUM           Index LA diam:      3.70 cm 1.76 cm/m   RA Area:     17.10 cm LA Vol (A4C): 73.3 ml 34.82 ml/m  RA Volume:   40.00 ml  19.00 ml/m  AORTIC VALVE                     PULMONIC VALVE AV Area (Vmax):    2.61 cm      PV Vmax:       0.87 m/s AV Area (Vmean):   2.33 cm      PV Vmean:      57.000 cm/s AV Area (VTI):     2.27 cm      PV VTI:        0.157 m AV Vmax:           218.00 cm/s   PV Peak grad:  3.0 mmHg AV Vmean:          160.000 cm/s  PV Mean grad:  2.0 mmHg AV VTI:            0.513 m AV Peak Grad:      19.0 mmHg AV Mean Grad:      11.0 mmHg LVOT Vmax:         116.00 cm/s LVOT Vmean:        75.800 cm/s LVOT VTI:          0.237 m LVOT/AV VTI ratio: 0.46  AORTA Ao Root diam: 3.40 cm MITRAL VALVE                TRICUSPID VALVE MV Area (PHT): 3.44 cm     TR Peak grad:   43.6 mmHg MV Area VTI:   3.32 cm     TR Vmax:        330.00 cm/s MV Peak grad:  6.6 mmHg MV Mean grad:  4.0 mmHg     SHUNTS MV Vmax:       1.28 m/s     Systemic VTI:  0.24 m MV Vmean:      93.5 cm/s    Systemic Diam: 2.50 cm MV Decel Time: 221 msec MV E velocity: 95.75 cm/s MV A velocity: 106.00 cm/s MV E/A ratio:  0.90 Serafina Royals MD Electronically signed by Serafina Royals MD Signature Date/Time: 05/29/2021/12:28:40 PM    Final     EKG: Normal sinus rhythm with left bundle branch block  Weights: Filed Weights   05/28/21 1836  Weight: 92.5 kg      Physical Exam: Blood pressure (!) 189/63, pulse 62, temperature 98 F (36.7 C), resp. rate 17, height 5\' 10"  (1.778 m), weight 92.5 kg, SpO2 99 %. Body mass index is 29.27 kg/m. General: Well developed, well nourished, in no acute distress. Head eyes ears nose throat: Normocephalic, atraumatic, sclera non-icteric, no xanthomas, nares are without discharge. No apparent thyromegaly and/or mass  Lungs: Normal respiratory effort.  no wheezes, no rales, no rhonchi.  Heart: RRR with normal S1 S2. no murmur gallop, no rub, PMI is normal size and placement, carotid upstroke normal without bruit, jugular venous pressure is normal Abdomen: Soft, non-tender, non-distended with normoactive bowel sounds. No hepatomegaly. No rebound/guarding. No obvious abdominal masses. Abdominal aorta is normal size without bruit Extremities: No edema. no cyanosis, no clubbing, no ulcers  Peripheral : 2+ bilateral upper extremity pulses, 2+ bilateral femoral pulses, 2+ bilateral dorsal pedal pulse Neuro: Alert and oriented. No facial asymmetry. No focal deficit. Moves all extremities spontaneously. Musculoskeletal: Normal muscle tone without kyphosis Psych:  Responds to questions appropriately with a normal affect.    Assessment: 81 year old male with known chronic systolic dysfunction congestive heart failure diabetes chronic obstructive pulmonary disease hypertension with chronic kidney disease stage IV and acute on chronic systolic dysfunction congestive heart failure with elevated troponin consistent with demand ischemia from severe anemia and heart failure with hypoxia and no current evidence of acute coronary syndrome  Plan: 1.  Avoid intravenous heparin and/or antiplatelets at this time until further treatment of significant anemia which is exacerbating his anginal symptoms 2.  No further cardiac intervention at this time due to severe chronic kidney disease and heart failure and anemia 3.  Continue  isosorbide amlodipine carvedilol combination for hypertension control and risk reduction of cardiovascular disease with concerns of heart failure 4.  Further consideration diuresis if necessary for elevated BNP and hypoxia.  Would be very careful with the concerns of worsening chronic kidney disease. 5.  Further treatment options after more stabilization below  Signed, Corey Skains M.D. [facility] 05/29/2021, 6:11 PM

## 2021-05-29 NOTE — Progress Notes (Signed)
Per MD Billie Ruddy may irrigate foley if needed

## 2021-05-29 NOTE — Consult Note (Addendum)
Inpatient Consultation   Patient ID: Peter [name] is a 81 y.o. male.  Requesting Provider: Enzo Bi, MD  Date of Admission: 05/28/2021  Date of Consult: 05/29/21   Reason for Consultation: melena   Patient's Chief Complaint:   Chief Complaint  Patient presents with   Chest Pain   Shortness of Breath    81 year old CM with CAD status post stent on Plavix, DM2, bladder cancer, chronic anemia who presented to the hospital with chest pain.  Gastroenterology was consulted for reported black stool and anemia.  Patient presented with chest pain and shortness of breath and is being evaluated by cardiology with uptrending troponin.  He feels like these are getting better upon evaluation.  Patient notes last week he had 2 black bowel movements and denied any NSAID use prior, Pepto-Bismol use or iron supplementation.  Since that day his bowels have returned to normal and brown in color.  He denies any abdominal pain nausea vomiting hematochezia or weight loss.  He has had some level of anemia since 2012 per records.  Patient is not keen on going to sleep for any procedures.  No active signs of GI bleeding.  Plavix is currently being held.  Large amount of blood in his catheter bag  Last dose plavix- yesterday Denies NSAIDs and anticoagulants Denies family history of gastrointestinal disease and malignancy Previous Endoscopies: remote h/o colonoscopy at age 54 y/o.  No history of EGD    Past Medical History:  Diagnosis Date   Anginal pain ([place])    Asthma    Bladder infection, acute 03/2011   "had a whole lot of bleeding from this"   CHF (congestive heart failure) ([place])    Coronary artery disease    High cholesterol    Hypertension    Macular degeneration 12/14/2011   "had it in my right; getting shots now in my left"   Myocardial infarction [facility]) 1996   NSTEMI (non-ST elevated myocardial infarction) ([place]) 12/14/2011   Shortness of breath 12/14/2011   "@ rest, lying down,  w/exertion"    Past Surgical History:  Procedure Laterality Date   [place]; ~ 2003; ~ 2008   "total of 3"   CORONARY STENT INTERVENTION N/A 08/10/2016   Procedure: Coronary Stent Intervention;  Surgeon: [name], MD;  Location: [place];  Service: Cardiovascular;  Laterality: N/A;   HERNIA REPAIR  ~ 2001   "abdominal w/mesh implanted"   LEFT HEART CATH AND CORONARY ANGIOGRAPHY N/A 08/10/2016   Procedure: Left Heart Cath and Coronary Angiography;  Surgeon: [name], MD;  Location: [place];  Service: Cardiovascular;  Laterality: N/A;   LEFT HEART CATHETERIZATION WITH CORONARY ANGIOGRAM N/A 12/16/2011   Procedure: LEFT HEART CATHETERIZATION WITH CORONARY ANGIOGRAM;  Surgeon: Minus Breeding, MD;  Location: [facility] C[facility] LAB;  Service: Cardiovascular;  Laterality: N/A;   PERCUTANEOUS CORONARY STENT INTERVENTION (PCI-S) N/A 12/17/2011   Procedure: PERCUTANEOUS CORONARY STENT INTERVENTION (PCI-S);  Surgeon: Sherren Mocha, MD;  Location: [facility] CATH LAB;  Service: Cardiovascular;  Laterality: N/A;   TONSILLECTOMY     "I was a kid"    Allergies  Allergen Reactions   Iodinated Contrast Media Shortness Of Breath   Iodinated Glycerol  [Glycerol, Iodinated] Shortness Of Breath    Family History  Family history unknown: Yes    Social History   Tobacco Use   Smoking status: Former    Packs/day: 0.50    Years: 0.50  Pack years: 0.25    Types: Cigarettes   Smokeless tobacco: Never   Tobacco comments:    11/23/16 Still not ready to quit smoking.  Substance Use Topics   Alcohol use: Yes    Alcohol/week: 1.0 standard drink    Types: 1 Cans of beer per week    Comment: 12/14/2011 "if my kidneys are sore, I drink 1 beer/day; if not sore; no beer; occasionally drink socially; ave 1 beer/wk maybe"   Drug use: No     Pertinent GI related history and allergies were reviewed with the patient  Review of Systems   Constitutional:  Positive for activity change and fatigue. Negative for appetite change, chills, diaphoresis, fever and unexpected weight change.  HENT:  Negative for trouble swallowing and voice change.   Respiratory:  Positive for shortness of breath. Negative for wheezing.   Cardiovascular:  Positive for chest pain. Negative for palpitations and leg swelling.  Gastrointestinal:  Positive for blood in stool (Melena times two 1 day last week). Negative for abdominal distention, abdominal pain, anal bleeding, constipation, diarrhea, nausea and vomiting.  Musculoskeletal:  Negative for arthralgias and myalgias.  Skin:  Negative for color change and pallor.  Neurological:  Positive for weakness. Negative for dizziness and syncope.  Psychiatric/Behavioral:  Negative for confusion. The patient is not nervous/anxious.   All other systems reviewed and are negative.   Medications Home Medications No current facility-administered medications on file prior to encounter.   Current Outpatient Medications on File Prior to Encounter  Medication Sig Dispense Refill   amLODipine (NORVASC) 10 MG tablet Take 1 tablet (10 mg total) by mouth daily. 30 tablet 0   atorvastatin (LIPITOR) 40 MG tablet Take 1 tablet (40 mg total) by mouth daily at 6 PM. 30 tablet 0   calcitRIOL (ROCALTROL) 0.25 MCG capsule Take 0.25 mcg by mouth daily.     carvedilol (COREG) 6.25 MG tablet Take 6.25 mg by mouth 2 (two) times daily with a meal.     cetirizine (ZYRTEC) 10 MG tablet Take 10 mg by mouth.     citalopram (CELEXA) 20 MG tablet Take 20 mg by mouth daily.     clobetasol cream (TEMOVATE) 8.10 % Apply 1 application topically 2 (two) times daily as needed (psoriasis).  1   clopidogrel (PLAVIX) 75 MG tablet TAKE 1 TABLET(75 MG) BY MOUTH EVERY DAY 30 tablet 1   cyanocobalamin 1000 MCG tablet Take 1,000 mcg by mouth daily.      finasteride (PROSCAR) 5 MG tablet Take 1 tablet (5 mg total) by mouth daily. 90 tablet 3    furosemide (LASIX) 40 MG tablet Take 1 tablet (40 mg total) by mouth daily.     GLUCOSAMINE-CHONDROITIN-VIT C PO Take 1 tablet by mouth daily.     hydrALAZINE (APRESOLINE) 100 MG tablet Take 1 tablet (100 mg total) by mouth every 8 (eight) hours. (Patient taking differently: Take 100 mg by mouth every 12 (twelve) hours.) 270 tablet 0   isosorbide mononitrate (IMDUR) 30 MG 24 hr tablet Take 30 mg by mouth daily.     omeprazole (PRILOSEC) 20 MG capsule Take 20 mg by mouth in the morning.     tamsulosin (FLOMAX) 0.4 MG CAPS capsule Take 1 capsule (0.4 mg total) by mouth daily. 90 capsule 3   spironolactone (ALDACTONE) 25 MG tablet Hold until followup with your outpatient doctor due to worsening kidney function. (Patient not taking: Reported on 05/28/2021)     Pertinent GI related medications were reviewed with  the patient  Inpatient Medications  Current Facility-Administered Medications:    acetaminophen (TYLENOL) tablet 650 mg, 650 mg, Oral, Q4H PRN, Para Skeans, MD   amLODipine (NORVASC) tablet 10 mg, 10 mg, Oral, Daily, Enzo Bi, MD, 10 mg at 05/29/21 0735   carvedilol (COREG) tablet 6.25 mg, 6.25 mg, Oral, BID WC, Enzo Bi, MD, 6.25 mg at 05/29/21 0086   Chlorhexidine Gluconate Cloth 2 % PADS 6 each, 6 each, Topical, Daily, Enzo Bi, MD   citalopram (CELEXA) tablet 20 mg, 20 mg, Oral, Daily, Enzo Bi, MD   Derrill Memo ON 05/30/2021] finasteride (PROSCAR) tablet 5 mg, 5 mg, Oral, Daily, Enzo Bi, MD   heparin injection 5,000 Units, 5,000 Units, Subcutaneous, Q12H, Para Skeans, MD   hydrALAZINE (APRESOLINE) injection 20 mg, 20 mg, Intravenous, Q6H PRN, Sharion Settler, NP   hydrALAZINE (APRESOLINE) tablet 100 mg, 100 mg, Oral, Q12H, Enzo Bi, MD, 100 mg at 05/29/21 0734   isosorbide mononitrate (IMDUR) 24 hr tablet 30 mg, 30 mg, Oral, Daily, Enzo Bi, MD, 30 mg at 05/29/21 0735   labetalol (NORMODYNE) injection 10 mg, 10 mg, Intravenous, Q2H PRN, Sharion Settler, NP   nitroGLYCERIN  (NITROSTAT) SL tablet 0.4 mg, 0.4 mg, Sublingual, Q5 min PRN, Enzo Bi, MD   ondansetron [facility]) injection 4 mg, 4 mg, Intravenous, Q6H PRN, Para Skeans, MD   pantoprazole (PROTONIX) EC tablet 40 mg, 40 mg, Oral, Daily, Enzo Bi, MD   potassium chloride SA (KLOR-CON M) CR tablet 40 mEq, 40 mEq, Oral, Once, Enzo Bi, MD   tamsulosin [facility]) capsule 0.4 mg, 0.4 mg, Oral, Daily, Enzo Bi, MD   acetaminophen, hydrALAZINE, labetalol, nitroGLYCERIN, ondansetron (ZOFRAN) IV   Objective   Vitals:   05/29/21 0800 05/29/21 0829 05/29/21 1102 05/29/21 1633  BP: (!) 154/73 (!) 166/66 (!) 175/80 (!) 189/63  Pulse: 76 75 72 62  Resp: (!) 21 19 18 17   Temp:  98.1 F (36.7 C) 98.2 F (36.8 C) 98 F (36.7 C)  TempSrc:      SpO2: 95% 98% 97% 99%  Weight:      Height:         Physical Exam Vitals and nursing note reviewed.  Constitutional:      General: He is not in acute distress.    Appearance: Normal appearance. He is not toxic-appearing or diaphoretic.  HENT:     Head: Normocephalic and atraumatic.     Nose: Nose normal.     Mouth/Throat:     Mouth: Mucous membranes are moist.     Pharynx: Oropharynx is clear.  Eyes:     General: No scleral icterus.    Extraocular Movements: Extraocular movements intact.  Cardiovascular:     Rate and Rhythm: Normal rate and regular rhythm.     Heart sounds: Murmur heard.    No friction rub. No gallop.  Pulmonary:     Effort: No respiratory distress.     Breath sounds: No wheezing, rhonchi or rales.     Comments: Diminished bilaterally Abdominal:     General: Abdomen is flat. Bowel sounds are normal. There is no distension.     Palpations: Abdomen is soft.     Tenderness: There is no abdominal tenderness. There is no guarding or rebound.  Musculoskeletal:     Cervical back: Neck supple.  Skin:    General: Skin is warm and dry.     Coloration: Skin is not jaundiced or pale.  Neurological:     Mental Status: He  is alert and oriented to  person, place, and time. Mental status is at baseline.  Psychiatric:        Mood and Affect: Mood normal.        Behavior: Behavior normal.        Thought Content: Thought content normal.        Judgment: Judgment normal.    Laboratory Data Recent Labs  Lab 05/28/21 1835 05/29/21 1023  WBC 7.3 6.9  HGB 7.7* 8.4*  HCT 24.1* 26.2*  PLT 130* 130*   Recent Labs  Lab 05/28/21 1835 05/29/21 1023  NA 143 143  K 3.7 3.4*  CL 113* 112*  CO2 24 23  BUN 75* 75*  CALCIUM 8.6* 8.6*  PROT 5.2*  --   BILITOT 0.6  --   ALKPHOS 75  --   ALT 15  --   AST 16  --   GLUCOSE 119* 102*   Recent Labs  Lab 05/28/21 2109  INR 1.3*    No results for input(s): LIPASE in the last 72 hours.      Imaging Studies: DG Chest Portable 1 View  Result Date: 05/28/2021 CLINICAL DATA:  Shortness of breath, chest pain EXAM: PORTABLE CHEST 1 VIEW COMPARISON:  05/01/2021 FINDINGS: Lungs are clear.  No pleural effusion or pneumothorax. The heart is normal in size.  Left subclavian ICD. Mild degenerative changes of the mid thoracic spine. IMPRESSION: No evidence of acute cardiopulmonary disease. Electronically Signed   By: Julian Hy M.D.   On: 05/28/2021 19:04   ECHOCARDIOGRAM COMPLETE  Result Date: 05/29/2021    ECHOCARDIOGRAM REPORT   Patient Name:   Peter [name] Date of Exam: 05/29/2021 Medical Rec #:  [id number]        Height:       70.0 in Accession #:    [id number]       Weight:       204.0 lb Date of Birth:  05/28/1940       BSA:          2.105 m Patient Age:    36 years         BP:           166/66 mmHg Patient Gender: M                HR:           62 bpm. Exam Location:  [facility] Procedure: 2D Echo, Color Doppler, Cardiac Doppler and Intracardiac            Opacification Agent Indications:     R07.9 Chest Pain  History:         Patient has prior history of Echocardiogram examinations, most                  recent 04/04/2021. CHF, CAD, Pacemaker; Risk Factors:HCL.  Sonographer:     Charmayne Sheer  Referring Phys:  [place] Diagnosing Phys: Serafina Royals MD  Sonographer Comments: Technically difficult study due to poor echo windows. Image acquisition challenging due to respiratory motion. IMPRESSIONS  1. Left ventricular ejection fraction, by estimation, is 40 to 45%. The left ventricle has mildly decreased function. The left ventricle demonstrates global hypokinesis. The left ventricular internal cavity size was mildly dilated. Left ventricular diastolic parameters were normal.  2. Right ventricular systolic function is normal. The right ventricular size is normal.  3. Left atrial size was mildly dilated.  4. Right atrial size was mildly dilated.  5. The mitral valve is normal in structure. Mild to moderate mitral valve regurgitation.  6. Tricuspid valve regurgitation is mild to moderate.  7. The aortic valve is normal in structure. Aortic valve regurgitation is not visualized. FINDINGS  Left Ventricle: Left ventricular ejection fraction, by estimation, is 40 to 45%. The left ventricle has mildly decreased function. The left ventricle demonstrates global hypokinesis. Definity contrast agent was given IV to delineate the left ventricular  endocardial borders. The left ventricular internal cavity size was mildly dilated. There is no left ventricular hypertrophy. Left ventricular diastolic parameters were normal. Right Ventricle: The right ventricular size is normal. No increase in right ventricular wall thickness. Right ventricular systolic function is normal. Left Atrium: Left atrial size was mildly dilated. Right Atrium: Right atrial size was mildly dilated. Pericardium: There is no evidence of pericardial effusion. Mitral Valve: The mitral valve is normal in structure. Mild to moderate mitral valve regurgitation. MV peak gradient, 6.6 mmHg. The mean mitral valve gradient is 4.0 mmHg. Tricuspid Valve: The tricuspid valve is normal in structure. Tricuspid valve regurgitation is mild to moderate.  Aortic Valve: The aortic valve is normal in structure. Aortic valve regurgitation is not visualized. Aortic valve mean gradient measures 11.0 mmHg. Aortic valve peak gradient measures 19.0 mmHg. Aortic valve area, by VTI measures 2.27 cm. Pulmonic Valve: The pulmonic valve was normal in structure. Pulmonic valve regurgitation is trivial. Aorta: The aortic root and ascending aorta are structurally normal, with no evidence of dilitation. IAS/Shunts: No atrial level shunt detected by color flow Doppler.  LEFT VENTRICLE PLAX 2D LVIDd:         5.23 cm   Diastology LVIDs:         4.07 cm   LV e' medial:    5.33 cm/s LV PW:         1.39 cm   LV E/e' medial:  18.0 LV IVS:        1.11 cm   LV e' lateral:   5.66 cm/s LVOT diam:     2.50 cm   LV E/e' lateral: 16.9 LV SV:         116 LV SV Index:   55 LVOT Area:     4.91 cm  RIGHT VENTRICLE RV Basal diam:  4.45 cm LEFT ATRIUM           Index        RIGHT ATRIUM           Index LA diam:      3.70 cm 1.76 cm/m   RA Area:     17.10 cm LA Vol (A4C): 73.3 ml 34.82 ml/m  RA Volume:   40.00 ml  19.00 ml/m  AORTIC VALVE                     PULMONIC VALVE AV Area (Vmax):    2.61 cm      PV Vmax:       0.87 m/s AV Area (Vmean):   2.33 cm      PV Vmean:      57.000 cm/s AV Area (VTI):     2.27 cm      PV VTI:        0.157 m AV Vmax:           218.00 cm/s   PV Peak grad:  3.0 mmHg AV Vmean:          160.000 cm/s  PV Mean grad:  2.0 mmHg  AV VTI:            0.513 m AV Peak Grad:      19.0 mmHg AV Mean Grad:      11.0 mmHg LVOT Vmax:         116.00 cm/s LVOT Vmean:        75.800 cm/s LVOT VTI:          0.237 m LVOT/AV VTI ratio: 0.46  AORTA Ao Root diam: 3.40 cm MITRAL VALVE                TRICUSPID VALVE MV Area (PHT): 3.44 cm     TR Peak grad:   43.6 mmHg MV Area VTI:   3.32 cm     TR Vmax:        330.00 cm/s MV Peak grad:  6.6 mmHg MV Mean grad:  4.0 mmHg     SHUNTS MV Vmax:       1.28 m/s     Systemic VTI:  0.24 m MV Vmean:      93.5 cm/s    Systemic Diam: 2.50 cm MV Decel  Time: 221 msec MV E velocity: 95.75 cm/s MV A velocity: 106.00 cm/s MV E/A ratio:  0.90 Serafina Royals MD Electronically signed by Serafina Royals MD Signature Date/Time: 05/29/2021/12:28:40 PM    Final     Assessment:   # Acute on chronic anemia - suspect multifactorial- ckd, on plavix, potential slow gi blood loss source - iron studies not supportive of deficiency  # Melena- two pt reported episodes last week-  Have returned to normal  # NSTEMI - chest pain and elevated troponin  # bilateral pleural effusions- on ct  # aki on ckd  # thrombocytopenia  # HF EF 40-45%; s/p aicd/ppm  # CAD s/p pci on plavix  # aortic aneurysm s/p endograft   Plan:   Pt presenting with chest pain and up trending troponin. Cardiology following for workup. Would need Cardiology risk stratification prior to any procedure.  Pt does not necessarily want to undergo procedure/sedation given his current medical situation. Stool has returned to brown and normal per patient since two episodes of black stools 1 day last week. At this time would manage conservatively given cardiac/pulmonary issues as risks with sedation would outweigh benefits at this time. Can continue protonix 40 mg iv q12h NPO at midnight for UGI study tomorrow to r/o outright ulcer/mass Hold dvt ppx.  Plavix on hold at this time. Stents were placed quite some time ago. Possible to d/c plavix for asa or no agent? -Defer to cardiology service Monitor H&H.  Transfusion and resuscitation as per primary team Avoid frequent lab draws to prevent lab induced anemia Supportive care and antiemetics as per primary team Maintain two sites IV access Avoid nsaids Monitor for GIB.  Patient agreeable to plan. D/w Hospitalist service.  I personally performed the service.  Management of other medical comorbidities as per primary team  Thank you for allowing [place] to participate in this patient's care. Please don't hesitate to call if any questions or  concerns arise.   Annamaria Helling, DO [facility]  Portions of the record may have been created with voice recognition software. Occasional wrong-word or 'sound-a-like' substitutions may have occurred due to the inherent limitations of voice recognition software.  Read the chart carefully and recognize, using context, where substitutions may have occurred.

## 2021-05-29 NOTE — Progress Notes (Signed)
Cross Cover Patient hemodynamically stable. Troponin\ downtrending. Remains hypertensive. Additional lasix ordered. Patient may transfer to progressive unit

## 2021-05-29 NOTE — ED Notes (Signed)
Attempted to call floor for report. Per floor, they will put purple man.

## 2021-05-29 NOTE — Progress Notes (Signed)
*  PRELIMINARY RESULTS* Echocardiogram 2D Echocardiogram has been performed.  Peter [name] 05/29/2021, 10:45 AM

## 2021-05-30 ENCOUNTER — Inpatient Hospital Stay: Payer: PPO

## 2021-05-30 DIAGNOSIS — R079 Chest pain, unspecified: Secondary | ICD-10-CM | POA: Diagnosis not present

## 2021-05-30 LAB — BASIC METABOLIC PANEL
Anion gap: 7 (ref 5–15)
BUN: 73 mg/dL — ABNORMAL HIGH (ref 8–23)
CO2: 24 mmol/L (ref 22–32)
Calcium: 8.5 mg/dL — ABNORMAL LOW (ref 8.9–10.3)
Chloride: 112 mmol/L — ABNORMAL HIGH (ref 98–111)
Creatinine, Ser: 3.63 mg/dL — ABNORMAL HIGH (ref 0.61–1.24)
GFR, Estimated: 16 mL/min — ABNORMAL LOW (ref >60)
Glucose, Bld: 102 mg/dL — ABNORMAL HIGH (ref 70–99)
Potassium: 3.7 mmol/L (ref 3.5–5.1)
Sodium: 143 mmol/L (ref 135–145)

## 2021-05-30 LAB — CBC
HCT: 24.4 % — ABNORMAL LOW (ref 39.0–52.0)
Hemoglobin: 7.9 g/dL — ABNORMAL LOW (ref 13.0–17.0)
MCH: 29.4 pg (ref 26.0–34.0)
MCHC: 32.4 g/dL (ref 30.0–36.0)
MCV: 90.7 fL (ref 80.0–100.0)
Platelets: 128 10*3/uL — ABNORMAL LOW (ref 150–400)
RBC: 2.69 MIL/uL — ABNORMAL LOW (ref 4.22–5.81)
RDW: 14.3 % (ref 11.5–15.5)
WBC: 6.6 10*3/uL (ref 4.0–10.5)
nRBC: 0 % (ref 0.0–0.2)

## 2021-05-30 LAB — MAGNESIUM: Magnesium: 1.9 mg/dL (ref 1.7–2.4)

## 2021-05-30 LAB — PREPARE RBC (CROSSMATCH)

## 2021-05-30 MED ORDER — SODIUM CHLORIDE 0.9% IV SOLUTION: Freq: Once | INTRAVENOUS | Status: DC

## 2021-05-30 MED ORDER — HYDRALAZINE HCL 50 MG PO TABS
100.0000 mg | ORAL_TABLET | Freq: Three times a day (TID) | ORAL | Status: DC
Start: 2021-05-30 — End: 2021-06-04
  Administered 2021-05-30 – 2021-06-04 (×15): 100 mg via ORAL
  Filled 2021-05-30 (×16): qty 2

## 2021-05-30 NOTE — Progress Notes (Signed)
PROGRESS NOTE    [name]  NWG:[id number] DOB: September 20, 1940 DOA: 05/28/2021 PCP: Tracie Harrier, MD  [id number]   Assessment & Plan:   Principal Problem:   Chest pain Active Problems:   SOB (shortness of breath)   CAD (coronary artery disease)   HTN (hypertension)   GERD (gastroesophageal reflux disease)   NSTEMI (non-ST elevated myocardial infarction) ([place])   Current tobacco use   S/P ICD (internal cardiac defibrillator) procedure   Anemia    Peter [name] is a 81 y.o.  male with medical history significant for systolic and diastolic heart failure, CAD s/p multiple stents, hypertension, status post AICD who presented with chest pain and dyspnea.     # Chest pain, ACS ruled out # Troponin elevation 2/2 demand ischemia # Hx of CAD s/p stents # Cardiomyopathy s/p AICD --trop up to 1600's.  Per cardio, Dr. Nehemiah Massed, avoided heparin gtt or anti-plt Plan: --no cath planned due to severe CKD --cont isosorbide for chest pain  # acute on chronic anemia --Hgb 7.7 on presentation, baseline 10-11 from a month ago.  Pt reported 2 episodes of black stools.  No prior GI workup.  Took plavix at home. --s/p 1u pRBC with appropriate Hgb increase to 8.4 --GI consulted Plan: --UGI study today, negative for ulceration. Potentially some gastritis --2nd unit pRBC  --transfuse goal Hgb >8  # Acute urinary retention # BPH --Had prior hx of urinary retention, but current episode occurred after presentation.  Last saw Dr. Diamantina Providence in Nov 2022. --Had 700 ml in bladder scan x2, so Foley inserted. Plan: --cont Foley --cont home finasteride and Flomax --outpatient followup with urology  # Hematuria, not POA --occurred only after I/O cath, in the setting of taking plavix at home. Plan: --hold plavix for now --intermittent bladder irrigation per RN  HTN --BP elevated --cont home Imdur, coreg and amlodipine --increase home hydralazine 100 mg from BID to TID   DVT  prophylaxis: SCD/Compression stockings Code Status: Full code  Family Communication:   Level of care: Progressive Dispo:   The patient is from: home Anticipated d/c is to: home Anticipated d/c date is: 2-3 days Patient currently is not medically ready to d/c due to: need hematuria to resolve and Hgb to stablize   Subjective and Interval History:  Still having hematuria, Hgb 7.9.  2nd unit of blood given.  Pt reported chest pain resolved.     Objective: Vitals:   05/30/21 0808 05/30/21 1125 05/30/21 1300 05/30/21 1711  BP: (!) 191/67 (!) 160/58 (!) 169/57 (!) 171/71  Pulse: 63 66 61 70  Resp: 18 17 16    Temp: 97.6 F (36.4 C) 97.7 F (36.5 C) 97.9 F (36.6 C) 97.7 F (36.5 C)  TempSrc:   Oral Oral  SpO2: 99% 95% 97% 97%  Weight:      Height:        Intake/Output Summary (Last 24 hours) at 05/30/2021 1745 Last data filed at 05/30/2021 1726 Gross per 24 hour  Intake 618 ml  Output 2200 ml  Net -1582 ml   Filed Weights   05/28/21 1836  Weight: 92.5 kg    Examination:   Constitutional: NAD, AAOx3 HEENT: conjunctivae and lids normal, EOMI CV: No cyanosis.   RESP: normal respiratory effort, on RA Extremities: No effusions, edema in BLE SKIN: warm, dry Neuro: II - XII grossly intact.   Psych: Normal mood and affect.  Appropriate judgement and reason  Foley present draining bloody urine   Data Reviewed: I  have personally reviewed following labs and imaging studies  CBC: Recent Labs  Lab 05/28/21 1835 05/29/21 1023 05/30/21 0542  WBC 7.3 6.9 6.6  HGB 7.7* 8.4* 7.9*  HCT 24.1* 26.2* 24.4*  MCV 92.7 90.0 90.7  PLT 130* 130* 263*   Basic Metabolic Panel: Recent Labs  Lab 05/28/21 1835 05/29/21 1023 05/30/21 0542  NA 143 143 143  K 3.7 3.4* 3.7  CL 113* 112* 112*  CO2 24 23 24   GLUCOSE 119* 102* 102*  BUN 75* 75* 73*  CREATININE 3.49* 3.47* 3.63*  CALCIUM 8.6* 8.6* 8.5*  MG  --  2.0 1.9   GFR: Estimated Creatinine Clearance: 18.2 mL/min (A)  (by C-G formula based on SCr of 3.63 mg/dL (H)). Liver Function Tests: Recent Labs  Lab 05/28/21 1835  AST 16  ALT 15  ALKPHOS 75  BILITOT 0.6  PROT 5.2*  ALBUMIN 3.0*   No results for input(s): LIPASE, AMYLASE in the last 168 hours. No results for input(s): AMMONIA in the last 168 hours. Coagulation Profile: Recent Labs  Lab 05/28/21 2109  INR 1.3*   Cardiac Enzymes: No results for input(s): CKTOTAL, CKMB, CKMBINDEX, TROPONINI in the last 168 hours. BNP (last 3 results) No results for input(s): PROBNP in the last 8760 hours. HbA1C: No results for input(s): HGBA1C in the last 72 hours. CBG: No results for input(s): GLUCAP in the last 168 hours. Lipid Profile: Recent Labs    05/29/21 0838  CHOL 110  HDL 29*  LDLCALC 59  TRIG 110  CHOLHDL 3.8   Thyroid Function Tests: No results for input(s): TSH, T4TOTAL, FREET4, T3FREE, THYROIDAB in the last 72 hours. Anemia Panel: Recent Labs    05/28/21 2109  FERRITIN 111  TIBC 188*  IRON 38*  RETICCTPCT 2.0   Sepsis Labs: No results for input(s): PROCALCITON, LATICACIDVEN in the last 168 hours.  Recent Results (from the past 240 hour(s))  Resp Panel by RT-PCR (Flu A&B, Covid) Nasopharyngeal Swab     Status: None   Collection Time: 05/28/21  7:10 PM   Specimen: Nasopharyngeal Swab; Nasopharyngeal(NP) swabs in vial transport medium  Result Value Ref Range Status   SARS Coronavirus 2 by RT PCR NEGATIVE NEGATIVE Final    Comment: (NOTE) SARS-CoV-2 target nucleic acids are NOT DETECTED.  The SARS-CoV-2 RNA is generally detectable in upper respiratory specimens during the acute phase of infection. The lowest concentration of SARS-CoV-2 viral copies this assay can detect is 138 copies/mL. A negative result does not preclude SARS-Cov-2 infection and should not be used as the sole basis for treatment or other patient management decisions. A negative result may occur with  improper specimen collection/handling, submission  of specimen other than nasopharyngeal swab, presence of viral mutation(s) within the areas targeted by this assay, and inadequate number of viral copies(<138 copies/mL). A negative result must be combined with clinical observations, patient history, and epidemiological information. The expected result is Negative.  Fact Sheet for Patients:  EntrepreneurPulse.com.au  Fact Sheet for Healthcare Providers:  IncredibleEmployment.be  This test is no t yet approved or cleared by the [place] FDA and  has been authorized for detection and/or diagnosis of SARS-CoV-2 by FDA under an Emergency Use Authorization (EUA). This EUA will remain  in effect (meaning this test can be used) for the duration of the COVID-19 declaration under Section 564(b)(1) of the Act, 21 U.S.C.section 360bbb-3(b)(1), unless the authorization is terminated  or revoked sooner.       Influenza A by PCR NEGATIVE  NEGATIVE Final   Influenza B by PCR NEGATIVE NEGATIVE Final    Comment: (NOTE) The Xpert Xpress SARS-CoV-2/FLU/RSV plus assay is intended as an aid in the diagnosis of influenza from Nasopharyngeal swab specimens and should not be used as a sole basis for treatment. Nasal washings and aspirates are unacceptable for Xpert Xpress SARS-CoV-2/FLU/RSV testing.  Fact Sheet for Patients: EntrepreneurPulse.com.au  Fact Sheet for Healthcare Providers: IncredibleEmployment.be  This test is not yet approved or cleared by the [place] FDA and has been authorized for detection and/or diagnosis of SARS-CoV-2 by FDA under an Emergency Use Authorization (EUA). This EUA will remain in effect (meaning this test can be used) for the duration of the COVID-19 declaration under Section 564(b)(1) of the Act, 21 U.S.C. section 360bbb-3(b)(1), unless the authorization is terminated or revoked.  Performed at [facility], [address], [place], [place] [address]       Radiology Studies: DG Chest Portable 1 View  Result Date: 05/28/2021 CLINICAL DATA:  Shortness of breath, chest pain EXAM: PORTABLE CHEST 1 VIEW COMPARISON:  05/01/2021 FINDINGS: Lungs are clear.  No pleural effusion or pneumothorax. The heart is normal in size.  Left subclavian ICD. Mild degenerative changes of the mid thoracic spine. IMPRESSION: No evidence of acute cardiopulmonary disease. Electronically Signed   By: Julian Hy M.D.   On: 05/28/2021 19:04   DG UGI W SINGLE CM (SOL OR THIN BA)  Result Date: 05/30/2021 CLINICAL DATA:  Upper GI bleed. Evaluate for mucosal ulceration/defect. EXAM: DG UGI W SINGLE CM TECHNIQUE: Scout radiograph was obtained. Single contrast examination was performed using thin liquid barium. This exam was performed by Candiss Norse, PA, and was supervised and interpreted by myself. FLUOROSCOPY TIME:  Radiation Exposure Index (as provided by the fluoroscopic device): 44.4 mGy COMPARISON:  Chest CT 05/27/2021.  Abdominopelvic CT 04/04/2021. FINDINGS: Scout Radiograph: Due to reported contrast allergy, the study was performed with thin barium after obtaining supine and erect views of the abdomen. The bowel gas pattern is nonobstructive. There is no evidence of pneumoperitoneum. Extensive postsurgical changes are noted, related to previous aortoiliac stent grafting. The patient has an AICD. Study was performed in the supine and semi erect positions. Patient was unable to stand or lie prone. The patient swallowed the barium without difficulty. The esophageal motility appears within normal limits. There is no evidence of stricture, mass or ulceration. Mild gastric fold thickening is noted which could indicate gastritis. No evidence of mucosal ulceration. The duodenum appears normal. IMPRESSION: 1. No specific cause for upper GI bleeding identified. There is no evidence of mucosal ulceration. 2. Gastric fold thickening which could  indicate gastritis. 3. No significant esophageal abnormality. Electronically Signed   By: Richardean Sale M.D.   On: 05/30/2021 13:12   ECHOCARDIOGRAM COMPLETE  Result Date: 05/29/2021    ECHOCARDIOGRAM REPORT   Patient Name:   Peter [name] Date of Exam: 05/29/2021 Medical Rec #:  [id number]        Height:       70.0 in Accession #:    [id number]       Weight:       204.0 lb Date of Birth:  1940-04-19       BSA:          2.105 m Patient Age:    54 years         BP:           166/66 mmHg Patient Gender: M  HR:           62 bpm. Exam Location:  [facility] Procedure: 2D Echo, Color Doppler, Cardiac Doppler and Intracardiac            Opacification Agent Indications:     R07.9 Chest Pain  History:         Patient has prior history of Echocardiogram examinations, most                  recent 04/04/2021. CHF, CAD, Pacemaker; Risk Factors:HCL.  Sonographer:     Charmayne Sheer Referring Phys:  [place] Diagnosing Phys: Serafina Royals MD  Sonographer Comments: Technically difficult study due to poor echo windows. Image acquisition challenging due to respiratory motion. IMPRESSIONS  1. Left ventricular ejection fraction, by estimation, is 40 to 45%. The left ventricle has mildly decreased function. The left ventricle demonstrates global hypokinesis. The left ventricular internal cavity size was mildly dilated. Left ventricular diastolic parameters were normal.  2. Right ventricular systolic function is normal. The right ventricular size is normal.  3. Left atrial size was mildly dilated.  4. Right atrial size was mildly dilated.  5. The mitral valve is normal in structure. Mild to moderate mitral valve regurgitation.  6. Tricuspid valve regurgitation is mild to moderate.  7. The aortic valve is normal in structure. Aortic valve regurgitation is not visualized. FINDINGS  Left Ventricle: Left ventricular ejection fraction, by estimation, is 40 to 45%. The left ventricle has mildly decreased function. The  left ventricle demonstrates global hypokinesis. Definity contrast agent was given IV to delineate the left ventricular  endocardial borders. The left ventricular internal cavity size was mildly dilated. There is no left ventricular hypertrophy. Left ventricular diastolic parameters were normal. Right Ventricle: The right ventricular size is normal. No increase in right ventricular wall thickness. Right ventricular systolic function is normal. Left Atrium: Left atrial size was mildly dilated. Right Atrium: Right atrial size was mildly dilated. Pericardium: There is no evidence of pericardial effusion. Mitral Valve: The mitral valve is normal in structure. Mild to moderate mitral valve regurgitation. MV peak gradient, 6.6 mmHg. The mean mitral valve gradient is 4.0 mmHg. Tricuspid Valve: The tricuspid valve is normal in structure. Tricuspid valve regurgitation is mild to moderate. Aortic Valve: The aortic valve is normal in structure. Aortic valve regurgitation is not visualized. Aortic valve mean gradient measures 11.0 mmHg. Aortic valve peak gradient measures 19.0 mmHg. Aortic valve area, by VTI measures 2.27 cm. Pulmonic Valve: The pulmonic valve was normal in structure. Pulmonic valve regurgitation is trivial. Aorta: The aortic root and ascending aorta are structurally normal, with no evidence of dilitation. IAS/Shunts: No atrial level shunt detected by color flow Doppler.  LEFT VENTRICLE PLAX 2D LVIDd:         5.23 cm   Diastology LVIDs:         4.07 cm   LV e' medial:    5.33 cm/s LV PW:         1.39 cm   LV E/e' medial:  18.0 LV IVS:        1.11 cm   LV e' lateral:   5.66 cm/s LVOT diam:     2.50 cm   LV E/e' lateral: 16.9 LV SV:         116 LV SV Index:   55 LVOT Area:     4.91 cm  RIGHT VENTRICLE RV Basal diam:  4.45 cm LEFT ATRIUM  Index        RIGHT ATRIUM           Index LA diam:      3.70 cm 1.76 cm/m   RA Area:     17.10 cm LA Vol (A4C): 73.3 ml 34.82 ml/m  RA Volume:   40.00 ml  19.00  ml/m  AORTIC VALVE                     PULMONIC VALVE AV Area (Vmax):    2.61 cm      PV Vmax:       0.87 m/s AV Area (Vmean):   2.33 cm      PV Vmean:      57.000 cm/s AV Area (VTI):     2.27 cm      PV VTI:        0.157 m AV Vmax:           218.00 cm/s   PV Peak grad:  3.0 mmHg AV Vmean:          160.000 cm/s  PV Mean grad:  2.0 mmHg AV VTI:            0.513 m AV Peak Grad:      19.0 mmHg AV Mean Grad:      11.0 mmHg LVOT Vmax:         116.00 cm/s LVOT Vmean:        75.800 cm/s LVOT VTI:          0.237 m LVOT/AV VTI ratio: 0.46  AORTA Ao Root diam: 3.40 cm MITRAL VALVE                TRICUSPID VALVE MV Area (PHT): 3.44 cm     TR Peak grad:   43.6 mmHg MV Area VTI:   3.32 cm     TR Vmax:        330.00 cm/s MV Peak grad:  6.6 mmHg MV Mean grad:  4.0 mmHg     SHUNTS MV Vmax:       1.28 m/s     Systemic VTI:  0.24 m MV Vmean:      93.5 cm/s    Systemic Diam: 2.50 cm MV Decel Time: 221 msec MV E velocity: 95.75 cm/s MV A velocity: 106.00 cm/s MV E/A ratio:  0.90 Serafina Royals MD Electronically signed by Serafina Royals MD Signature Date/Time: 05/29/2021/12:28:40 PM    Final      Scheduled Meds:  sodium chloride   Intravenous Once   amLODipine  10 mg Oral Daily   carvedilol  6.25 mg Oral BID WC   Chlorhexidine Gluconate Cloth  6 each Topical Daily   citalopram  20 mg Oral Daily   finasteride  5 mg Oral Daily   hydrALAZINE  100 mg Oral TID   isosorbide mononitrate  30 mg Oral Daily   pantoprazole (PROTONIX) IV  40 mg Intravenous Q12H   tamsulosin  0.4 mg Oral Daily   Continuous Infusions:   LOS: 2 days     Enzo Bi, MD [facility] If 7PM-7AM, please contact night-coverage 05/30/2021, 5:45 PM

## 2021-05-30 NOTE — Plan of Care (Signed)
°  Problem: Health Behavior/Discharge Planning: Goal: Ability to manage health-related needs will improve Outcome: Progressing   Problem: Clinical Measurements: Goal: Cardiovascular complication will be avoided Outcome: Progressing   Problem: Activity: Goal: Risk for activity intolerance will decrease Outcome: Progressing   Problem: Pain Managment: Goal: General experience of comfort will improve Outcome: Progressing   Problem: Safety: Goal: Ability to remain free from injury will improve Outcome: Progressing   Problem: Elimination: Goal: Will not experience complications related to urinary retention Outcome: Progressing

## 2021-05-30 NOTE — Progress Notes (Signed)
Patient foley draining red urine.  This RN flushed foley with 60ml saline. No clots noted.  Flushed without resistance.

## 2021-05-30 NOTE — Progress Notes (Signed)
[facility] Encounter Note  Patient: Peter [name] / Admit Date: 05/28/2021 / Date of Encounter: 05/30/2021, 3:56 PM   Subjective: Patient is overall doing relatively well since yesterday from the cardiovascular standpoint.  There is been no evidence of further chest discomfort.  Troponin level peaked at 1696 possibly consistent with demand ischemia from significant anemia with hemoglobin down in the 7.7 range as well as a glomerular filtration rate of 17.  No evidence of overt changes by EKG although EKG has been abnormal before with left bundle branch block with previous congestive heart failure and defibrillator placement with CRT Echocardiogram showing mild LV systolic dysfunction unchanged from before. The patient did have significant amount of hematuria but no evidence of GI bleed as a source of his significant anemia.  Plavix aspirin heparin have all been held with continued issues of hematuria.  Current blood pressure has been slightly elevated but reasonable before on appropriate medication management  Review of Systems: Positive for: Shortness of breath Negative for: Vision change, hearing change, syncope, dizziness, nausea, vomiting,diarrhea, bloody stool, stomach pain, cough, congestion, diaphoresis, urinary frequency, urinary pain,skin lesions, skin rashes Others previously listed  Objective: Telemetry: Normal sinus rhythm with bundle branch block Physical Exam: Blood pressure (!) 169/57, pulse 61, temperature 97.9 F (36.6 C), temperature source Oral, resp. rate 16, height 5\' 10"  (1.778 m), weight 92.5 kg, SpO2 97 %. Body mass index is 29.27 kg/m. General: Well developed, well nourished, in no acute distress. Head: Normocephalic, atraumatic, sclera non-icteric, no xanthomas, nares are without discharge. Neck: No apparent masses Lungs: Normal respirations with no wheezes, few rhonchi, no rales , no crackles   Heart: Regular rate and rhythm, normal S1 S2,  no murmur, no rub, no gallop, PMI is normal size and placement, carotid upstroke normal without bruit, jugular venous pressure normal Abdomen: Soft, non-tender, non-distended with normoactive bowel sounds. No hepatosplenomegaly. Abdominal aorta is normal size without bruit Extremities: No edema, no clubbing, no cyanosis, no ulcers,  Peripheral: 2+ radial, 2+ femoral, 2+ dorsal pedal pulses Neuro: Alert and oriented. Moves all extremities spontaneously. Psych:  Responds to questions appropriately with a normal affect.   Intake/Output Summary (Last 24 hours) at 05/30/2021 1556 Last data filed at 05/30/2021 9147 Gross per 24 hour  Intake 240 ml  Output 2700 ml  Net -2460 ml    Inpatient Medications:   sodium chloride   Intravenous Once   amLODipine  10 mg Oral Daily   carvedilol  6.25 mg Oral BID WC   Chlorhexidine Gluconate Cloth  6 each Topical Daily   citalopram  20 mg Oral Daily   finasteride  5 mg Oral Daily   hydrALAZINE  100 mg Oral Q12H   isosorbide mononitrate  30 mg Oral Daily   pantoprazole (PROTONIX) IV  40 mg Intravenous Q12H   tamsulosin  0.4 mg Oral Daily   Infusions:   Labs: Recent Labs    05/29/21 1023 05/30/21 0542  NA 143 143  K 3.4* 3.7  CL 112* 112*  CO2 23 24  GLUCOSE 102* 102*  BUN 75* 73*  CREATININE 3.47* 3.63*  CALCIUM 8.6* 8.5*  MG 2.0 1.9   Recent Labs    05/28/21 1835  AST 16  ALT 15  ALKPHOS 75  BILITOT 0.6  PROT 5.2*  ALBUMIN 3.0*   Recent Labs    05/29/21 1023 05/30/21 0542  WBC 6.9 6.6  HGB 8.4* 7.9*  HCT 26.2* 24.4*  MCV 90.0 90.7  PLT 130*  128*   No results for input(s): CKTOTAL, CKMB, TROPONINI in the last 72 hours. Invalid input(s): POCBNP No results for input(s): HGBA1C in the last 72 hours.   Weights: Filed Weights   05/28/21 1836  Weight: 92.5 kg     Radiology/Studies:  CT CHEST WO CONTRAST  Result Date: 05/28/2021 CLINICAL DATA:  Shortness of breath. History of congestive heart failure. Substantial  smoking history. EXAM: CT CHEST WITHOUT CONTRAST TECHNIQUE: Multidetector CT imaging of the chest was performed following the standard protocol without IV contrast. RADIATION DOSE REDUCTION: This exam was performed according to the departmental dose-optimization program which includes automated exposure control, adjustment of the mA and/or kV according to patient size and/or use of iterative reconstruction technique. COMPARISON:  Chest radiograph 05/01/2021 and chest CT 04/12/2021 FINDINGS: Cardiovascular: Coronary, aortic arch, and branch vessel atherosclerotic vascular disease. Coronary stents. AICD noted. 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 Small anterior pericardial effusion. Borderline cardiomegaly. Descending thoracic aortic and upper abdominal aortic stent. Mediastinum/Nodes: AP window lymph node 0.8 cm in short axis on image 62 series 2, stable. Low-density subcarinal lymph nodes including a 1.0 cm node on image 89 series 2, formerly the same. Other scattered small mediastinal lymph nodes are present. Lungs/Pleura: Small bilateral pleural effusions although both are increased from 04/12/2021. Centrilobular emphysema. Mild secondary pulmonary lobular septal accentuation of the apices. Bilateral airway thickening. 1.0 by 0.6 cm stable ground-glass density nodule in the apicoposterior segment left upper lobe on image 22 series 3. Ground-glass density peribronchovascular nodule in the left upper lobe on image 33 series 3 is stable at 1.2 by 0.7 cm. Ground-glass density 1.7 by 1.0 cm right lower lobe nodule on image 72 series 3. Increased density of a 0.8 by 0.5 cm right upper lobe sub solid pulmonary nodule on image 62 series 3 compared to the 04/12/2021 exam. Mildly increased atelectasis in the left lower lobe compared to previous. Upper Abdomen: Small depending gallstones in the gallbladder. Upper abdominal aortic stent with stents in the  proximal celiac trunk, SMA, and renal arteries. We partially image a ventral hernia containing adipose tissue on image 185 series 2. Musculoskeletal: Bilateral gynecomastia. 2.0 by 0.6 cm sclerotic lesion in the right posterolateral sixth rib, no change from 12/14/2011, considered benign. IMPRESSION: 1. Small but mildly worsened bilateral pleural effusions with associated passive atelectasis. 2. Airway thickening is present, suggesting bronchitis or reactive airways disease. 3. Several ground-glass density pulmonary nodules are present including two left apical nodule stable from 04/12/2021 (questionable precursors of these lesions on 12/14/2011) and a right upper lobe nodule which has increased in density compared to that time, as well as a stable right lower lobe ground-glass density pulmonary nodule. Low-grade adenocarcinoma is a possibility and surveillance is recommended. 4. Aortic Atherosclerosis (ICD10-I70.0) and Emphysema (ICD10-J43.9). Coronary atherosclerosis. 5. 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 Recommend semi-annual imaging followup by CTA or MRA and referral to cardiothoracic surgery if not already obtained. This recommendation follows 2010 ACCF/AHA/AATS/ACR/ASA/SCA/SCAI/SIR/STS/SVM Guidelines for the Diagnosis and Management of Patients With Thoracic Aortic Disease. Circulation. 2010; 121: Z610-R60. Aortic aneurysm NOS (ICD10-I71.9) 6. Coronary and abdominal aortic stents along with stents in the celiac trunk, SMA, and proximal renal arteries. AICD noted. 7. Small anterior pericardial effusion. 8. Cholelithiasis. 9. Partially imaged ventral hernia containing adipose tissue. Electronically Signed   By: Van Clines M.D.   On: 05/28/2021 08:44   NM Pulmonary Perfusion  Result Date: 05/01/2021 CLINICAL  DATA:  Shortness of breath EXAM: NUCLEAR MEDICINE PERFUSION LUNG SCAN TECHNIQUE: Perfusion images were obtained in multiple projections after intravenous injection of radiopharmaceutical.  Ventilation scans intentionally deferred if perfusion scan and chest x-ray adequate for interpretation during COVID 19 epidemic. RADIOPHARMACEUTICALS:  4.1 mCi Tc-58m MAA IV COMPARISON:  Same day chest radiograph FINDINGS: Heterogeneous perfusion of the lung with multiple small subsegmental perfusion defects. IMPRESSION: Nondiagnostic (low or intermediate probability) Electronically Signed   By: Yetta Glassman M.D.   On: 05/01/2021 14:48   DG Chest Portable 1 View  Result Date: 05/28/2021 CLINICAL DATA:  Shortness of breath, chest pain EXAM: PORTABLE CHEST 1 VIEW COMPARISON:  05/01/2021 FINDINGS: Lungs are clear.  No pleural effusion or pneumothorax. The heart is normal in size.  Left subclavian ICD. Mild degenerative changes of the mid thoracic spine. IMPRESSION: No evidence of acute cardiopulmonary disease. Electronically Signed   By: Julian Hy M.D.   On: 05/28/2021 19:04   DG Chest Port 1 View  Result Date: 05/01/2021 CLINICAL DATA:  Shortness of breath EXAM: PORTABLE CHEST 1 VIEW COMPARISON:  Previous studies including the examination of 04/27/2021 FINDINGS: Transverse diameter of heart is increased. Pacemaker battery is seen in the left infraclavicular region. Biventricular pacer leads are noted in place. Central pulmonary vessels are more prominent. Subtle increase in interstitial markings are seen in the lower lung fields, more so on the right side. Costophrenic angles are clear. There is no pneumothorax. IMPRESSION: Cardiomegaly. Central pulmonary vessels are prominent without signs of alveolar pulmonary edema. Increased interstitial markings are seen in the both lower lung fields, more so on the right side which may suggest mild asymmetric interstitial pulmonary edema or interstitial pneumonia. Electronically Signed   By: Elmer Picker M.D.   On: 05/01/2021 13:30   DG UGI W SINGLE CM (SOL OR THIN BA)  Result Date: 05/30/2021 CLINICAL DATA:  Upper GI bleed. Evaluate for mucosal  ulceration/defect. EXAM: DG UGI W SINGLE CM TECHNIQUE: Scout radiograph was obtained. Single contrast examination was performed using thin liquid barium. This exam was performed by Candiss Norse, PA, and was supervised and interpreted by myself. FLUOROSCOPY TIME:  Radiation Exposure Index (as provided by the fluoroscopic device): 44.4 mGy COMPARISON:  Chest CT 05/27/2021.  Abdominopelvic CT 04/04/2021. FINDINGS: Scout Radiograph: Due to reported contrast allergy, the study was performed with thin barium after obtaining supine and erect views of the abdomen. The bowel gas pattern is nonobstructive. There is no evidence of pneumoperitoneum. Extensive postsurgical changes are noted, related to previous aortoiliac stent grafting. The patient has an AICD. Study was performed in the supine and semi erect positions. Patient was unable to stand or lie prone. The patient swallowed the barium without difficulty. The esophageal motility appears within normal limits. There is no evidence of stricture, mass or ulceration. Mild gastric fold thickening is noted which could indicate gastritis. No evidence of mucosal ulceration. The duodenum appears normal. IMPRESSION: 1. No specific cause for upper GI bleeding identified. There is no evidence of mucosal ulceration. 2. Gastric fold thickening which could indicate gastritis. 3. No significant esophageal abnormality. Electronically Signed   By: Richardean Sale M.D.   On: 05/30/2021 13:12   ECHOCARDIOGRAM COMPLETE  Result Date: 05/29/2021    ECHOCARDIOGRAM REPORT   Patient Name:   Peter [name] Date of Exam: 05/29/2021 Medical Rec #:  [id number]        Height:       70.0 in Accession #:    [id number]  Weight:       204.0 lb Date of Birth:  1940/05/16       BSA:          2.105 m Patient Age:    54 years         BP:           166/66 mmHg Patient Gender: M                HR:           62 bpm. Exam Location:  [facility] Procedure: 2D Echo, Color Doppler, Cardiac Doppler and  Intracardiac            Opacification Agent Indications:     R07.9 Chest Pain  History:         Patient has prior history of Echocardiogram examinations, most                  recent 04/04/2021. CHF, CAD, Pacemaker; Risk Factors:HCL.  Sonographer:     Charmayne Sheer Referring Phys:  Tome Diagnosing Phys: Serafina Royals MD  Sonographer Comments: Technically difficult study due to poor echo windows. Image acquisition challenging due to respiratory motion. IMPRESSIONS  1. Left ventricular ejection fraction, by estimation, is 40 to 45%. The left ventricle has mildly decreased function. The left ventricle demonstrates global hypokinesis. The left ventricular internal cavity size was mildly dilated. Left ventricular diastolic parameters were normal.  2. Right ventricular systolic function is normal. The right ventricular size is normal.  3. Left atrial size was mildly dilated.  4. Right atrial size was mildly dilated.  5. The mitral valve is normal in structure. Mild to moderate mitral valve regurgitation.  6. Tricuspid valve regurgitation is mild to moderate.  7. The aortic valve is normal in structure. Aortic valve regurgitation is not visualized. FINDINGS  Left Ventricle: Left ventricular ejection fraction, by estimation, is 40 to 45%. The left ventricle has mildly decreased function. The left ventricle demonstrates global hypokinesis. Definity contrast agent was given IV to delineate the left ventricular  endocardial borders. The left ventricular internal cavity size was mildly dilated. There is no left ventricular hypertrophy. Left ventricular diastolic parameters were normal. Right Ventricle: The right ventricular size is normal. No increase in right ventricular wall thickness. Right ventricular systolic function is normal. Left Atrium: Left atrial size was mildly dilated. Right Atrium: Right atrial size was mildly dilated. Pericardium: There is no evidence of pericardial effusion. Mitral Valve: The  mitral valve is normal in structure. Mild to moderate mitral valve regurgitation. MV peak gradient, 6.6 mmHg. The mean mitral valve gradient is 4.0 mmHg. Tricuspid Valve: The tricuspid valve is normal in structure. Tricuspid valve regurgitation is mild to moderate. Aortic Valve: The aortic valve is normal in structure. Aortic valve regurgitation is not visualized. Aortic valve mean gradient measures 11.0 mmHg. Aortic valve peak gradient measures 19.0 mmHg. Aortic valve area, by VTI measures 2.27 cm. Pulmonic Valve: The pulmonic valve was normal in structure. Pulmonic valve regurgitation is trivial. Aorta: The aortic root and ascending aorta are structurally normal, with no evidence of dilitation. IAS/Shunts: No atrial level shunt detected by color flow Doppler.  LEFT VENTRICLE PLAX 2D LVIDd:         5.23 cm   Diastology LVIDs:         4.07 cm   LV e' medial:    5.33 cm/s LV PW:         1.39 cm   LV E/e'  medial:  18.0 LV IVS:        1.11 cm   LV e' lateral:   5.66 cm/s LVOT diam:     2.50 cm   LV E/e' lateral: 16.9 LV SV:         116 LV SV Index:   55 LVOT Area:     4.91 cm  RIGHT VENTRICLE RV Basal diam:  4.45 cm LEFT ATRIUM           Index        RIGHT ATRIUM           Index LA diam:      3.70 cm 1.76 cm/m   RA Area:     17.10 cm LA Vol (A4C): 73.3 ml 34.82 ml/m  RA Volume:   40.00 ml  19.00 ml/m  AORTIC VALVE                     PULMONIC VALVE AV Area (Vmax):    2.61 cm      PV Vmax:       0.87 m/s AV Area (Vmean):   2.33 cm      PV Vmean:      57.000 cm/s AV Area (VTI):     2.27 cm      PV VTI:        0.157 m AV Vmax:           218.00 cm/s   PV Peak grad:  3.0 mmHg AV Vmean:          160.000 cm/s  PV Mean grad:  2.0 mmHg AV VTI:            0.513 m AV Peak Grad:      19.0 mmHg AV Mean Grad:      11.0 mmHg LVOT Vmax:         116.00 cm/s LVOT Vmean:        75.800 cm/s LVOT VTI:          0.237 m LVOT/AV VTI ratio: 0.46  AORTA Ao Root diam: 3.40 cm MITRAL VALVE                TRICUSPID VALVE MV Area (PHT):  3.44 cm     TR Peak grad:   43.6 mmHg MV Area VTI:   3.32 cm     TR Vmax:        330.00 cm/s MV Peak grad:  6.6 mmHg MV Mean grad:  4.0 mmHg     SHUNTS MV Vmax:       1.28 m/s     Systemic VTI:  0.24 m MV Vmean:      93.5 cm/s    Systemic Diam: 2.50 cm MV Decel Time: 221 msec MV E velocity: 95.75 cm/s MV A velocity: 106.00 cm/s MV E/A ratio:  0.90 Serafina Royals MD Electronically signed by Serafina Royals MD Signature Date/Time: 05/29/2021/12:28:40 PM    Final      Assessment and Recommendation  81 y.o. male with known coronary artery disease hypertension hyperlipidemia chronic obstructive pulmonary disease diabetes and cardiomyopathy status post previous PCI and stent placement with defibrillator pression and CRT-D on appropriate previous medication management with chest pain most consistent with demand ischemia and elevated troponin consistent with demand ischemia non-ST elevation myocardial infarction but no evidence of acute coronary syndrome 1.  Continuation of isosorbide for concerns of chest pain and demand ischemia and coronary artery disease 2.  Continuation of carvedilol amlodipine for  hypertension control and treatment 3.  Careful use of any diuretics due to severe chronic kidney disease exacerbation concerns 4.  Proceed to further work-up significant causes of anemia and possible hematuria as a source 5.  No further cardiac diagnostics necessary at this time  Signed, Serafina Royals M.D. FACC

## 2021-05-30 NOTE — Care Management Important Message (Signed)
Important Message  Patient Details  Name: Peter [name] MRN: [id number] Date of Birth: 1940-07-14   Medicare Important Message Given:  Yes  Patient out of room upon time of visit.  Copy of Medicare IM left on beside tray for reference.     [name] 05/30/2021, 1:33 PM

## 2021-05-30 NOTE — Progress Notes (Signed)
Inpatient Follow-up/Progress Note   Patient ID: Peter [name] is a 81 y.o. male.  Overnight Events / Subjective Findings No acute gi events o/n. No signs fo GIB. Hgb at 7.9 from 8.4. No abdominal pain. UGI study was negative. No other acute gi complaints.  Review of Systems  Constitutional:  Negative for activity change, appetite change, chills, diaphoresis, fatigue, fever and unexpected weight change.  HENT:  Negative for trouble swallowing and voice change.   Respiratory:  Negative for shortness of breath and wheezing.   Cardiovascular:  Negative for chest pain, palpitations and leg swelling.  Gastrointestinal:  Negative for abdominal distention, abdominal pain, anal bleeding, blood in stool, constipation, diarrhea, nausea and vomiting.  Genitourinary:  Positive for hematuria.  Musculoskeletal:  Negative for arthralgias and myalgias.  Skin:  Negative for color change and pallor.  Neurological:  Negative for dizziness, syncope and weakness.  Psychiatric/Behavioral:  Negative for confusion. The patient is not nervous/anxious.   All other systems reviewed and are negative.   Medications  Current Facility-Administered Medications:    0.9 %  sodium chloride infusion (Manually program via Guardrails IV Fluids), , Intravenous, Once, Enzo Bi, MD   acetaminophen (TYLENOL) tablet 650 mg, 650 mg, Oral, Q4H PRN, Para Skeans, MD   amLODipine (NORVASC) tablet 10 mg, 10 mg, Oral, Daily, Enzo Bi, MD, 10 mg at 05/30/21 0933   carvedilol (COREG) tablet 6.25 mg, 6.25 mg, Oral, BID WC, Enzo Bi, MD, 6.25 mg at 05/30/21 2423   Chlorhexidine Gluconate Cloth 2 % PADS 6 each, 6 each, Topical, Daily, Enzo Bi, MD, 6 each at 05/30/21 1158   citalopram (CELEXA) tablet 20 mg, 20 mg, Oral, Daily, Enzo Bi, MD, 20 mg at 05/30/21 0933   finasteride (PROSCAR) tablet 5 mg, 5 mg, Oral, Daily, Enzo Bi, MD, 5 mg at 05/30/21 0934   hydrALAZINE (APRESOLINE) injection 20 mg, 20 mg, Intravenous, Q6H PRN,  Sharion Settler, NP   hydrALAZINE (APRESOLINE) tablet 100 mg, 100 mg, Oral, Q12H, Enzo Bi, MD, 100 mg at 05/30/21 0933   isosorbide mononitrate (IMDUR) 24 hr tablet 30 mg, 30 mg, Oral, Daily, Enzo Bi, MD, 30 mg at 05/30/21 0933   labetalol (NORMODYNE) injection 10 mg, 10 mg, Intravenous, Q2H PRN, Sharion Settler, NP   nitroGLYCERIN (NITROSTAT) SL tablet 0.4 mg, 0.4 mg, Sublingual, Q5 min PRN, Enzo Bi, MD   ondansetron [facility]) injection 4 mg, 4 mg, Intravenous, Q6H PRN, Para Skeans, MD   pantoprazole (PROTONIX) injection 40 mg, 40 mg, Intravenous, Q12H, Annamaria Helling, DO, 40 mg at 05/30/21 0933   tamsulosin (FLOMAX) capsule 0.4 mg, 0.4 mg, Oral, Daily, Enzo Bi, MD, 0.4 mg at 05/30/21 0933   acetaminophen, hydrALAZINE, labetalol, nitroGLYCERIN, ondansetron (ZOFRAN) IV   Objective    Vitals:   05/30/21 0410 05/30/21 0808 05/30/21 1125 05/30/21 1300  BP: (!) 166/66 (!) 191/67 (!) 160/58 (!) 169/57  Pulse:  63 66 61  Resp:  18 17 16   Temp: 97.6 F (36.4 C) 97.6 F (36.4 C) 97.7 F (36.5 C) 97.9 F (36.6 C)  TempSrc: Oral   Oral  SpO2: 100% 99% 95% 97%  Weight:      Height:         Physical Exam Vitals and nursing note reviewed.  Constitutional:      General: He is not in acute distress.    Appearance: Normal appearance. He is not ill-appearing, toxic-appearing or diaphoretic.  HENT:     Head: Normocephalic and atraumatic.  Nose: Nose normal.     Mouth/Throat:     Mouth: Mucous membranes are moist.     Pharynx: Oropharynx is clear.  Eyes:     General: No scleral icterus.    Extraocular Movements: Extraocular movements intact.  Cardiovascular:     Rate and Rhythm: Normal rate and regular rhythm.     Heart sounds: Murmur heard.    No friction rub. No gallop.  Pulmonary:     Effort: Pulmonary effort is normal. No respiratory distress.     Breath sounds: Normal breath sounds. No wheezing, rhonchi or rales.  Abdominal:     General: Abdomen is flat.  Bowel sounds are normal. There is no distension.     Palpations: Abdomen is soft.     Tenderness: There is no abdominal tenderness. There is no guarding or rebound.  Genitourinary:    Comments: Foley in place with gross hematuria in bag Musculoskeletal:     Cervical back: Neck supple.     Right lower leg: No edema.     Left lower leg: No edema.  Skin:    General: Skin is warm and dry.     Coloration: Skin is not jaundiced or pale.  Neurological:     General: No focal deficit present.     Mental Status: He is alert and oriented to person, place, and time. Mental status is at baseline.  Psychiatric:        Mood and Affect: Mood normal.        Behavior: Behavior normal.        Thought Content: Thought content normal.        Judgment: Judgment normal.     Laboratory Data Recent Labs  Lab 05/28/21 1835 05/29/21 1023 05/30/21 0542  WBC 7.3 6.9 6.6  HGB 7.7* 8.4* 7.9*  HCT 24.1* 26.2* 24.4*  PLT 130* 130* 128*   Recent Labs  Lab 05/28/21 1835 05/29/21 1023 05/30/21 0542  NA 143 143 143  K 3.7 3.4* 3.7  CL 113* 112* 112*  CO2 24 23 24   BUN 75* 75* 73*  CREATININE 3.49* 3.47* 3.63*  CALCIUM 8.6* 8.6* 8.5*  PROT 5.2*  --   --   BILITOT 0.6  --   --   ALKPHOS 75  --   --   ALT 15  --   --   AST 16  --   --   GLUCOSE 119* 102* 102*   Recent Labs  Lab 05/28/21 2109  INR 1.3*      Imaging Studies: DG Chest Portable 1 View  Result Date: 05/28/2021 CLINICAL DATA:  Shortness of breath, chest pain EXAM: PORTABLE CHEST 1 VIEW COMPARISON:  05/01/2021 FINDINGS: Lungs are clear.  No pleural effusion or pneumothorax. The heart is normal in size.  Left subclavian ICD. Mild degenerative changes of the mid thoracic spine. IMPRESSION: No evidence of acute cardiopulmonary disease. Electronically Signed   By: Julian Hy M.D.   On: 05/28/2021 19:04   DG UGI W SINGLE CM (SOL OR THIN BA)  Result Date: 05/30/2021 CLINICAL DATA:  Upper GI bleed. Evaluate for mucosal  ulceration/defect. EXAM: DG UGI W SINGLE CM TECHNIQUE: Scout radiograph was obtained. Single contrast examination was performed using thin liquid barium. This exam was performed by Candiss Norse, PA, and was supervised and interpreted by myself. FLUOROSCOPY TIME:  Radiation Exposure Index (as provided by the fluoroscopic device): 44.4 mGy COMPARISON:  Chest CT 05/27/2021.  Abdominopelvic CT 04/04/2021. FINDINGS: Scout Radiograph: Due to reported  contrast allergy, the study was performed with thin barium after obtaining supine and erect views of the abdomen. The bowel gas pattern is nonobstructive. There is no evidence of pneumoperitoneum. Extensive postsurgical changes are noted, related to previous aortoiliac stent grafting. The patient has an AICD. Study was performed in the supine and semi erect positions. Patient was unable to stand or lie prone. The patient swallowed the barium without difficulty. The esophageal motility appears within normal limits. There is no evidence of stricture, mass or ulceration. Mild gastric fold thickening is noted which could indicate gastritis. No evidence of mucosal ulceration. The duodenum appears normal. IMPRESSION: 1. No specific cause for upper GI bleeding identified. There is no evidence of mucosal ulceration. 2. Gastric fold thickening which could indicate gastritis. 3. No significant esophageal abnormality. Electronically Signed   By: Richardean Sale M.D.   On: 05/30/2021 13:12   ECHOCARDIOGRAM COMPLETE  Result Date: 05/29/2021    ECHOCARDIOGRAM REPORT   Patient Name:   ALEXANDR YAWORSKI Date of Exam: 05/29/2021 Medical Rec #:  [id number]        Height:       70.0 in Accession #:    [id number]       Weight:       204.0 lb Date of Birth:  April 08, 1941       BSA:          2.105 m Patient Age:    89 years         BP:           166/66 mmHg Patient Gender: M                HR:           62 bpm. Exam Location:  [facility] Procedure: 2D Echo, Color Doppler, Cardiac Doppler and  Intracardiac            Opacification Agent Indications:     R07.9 Chest Pain  History:         Patient has prior history of Echocardiogram examinations, most                  recent 04/04/2021. CHF, CAD, Pacemaker; Risk Factors:HCL.  Sonographer:     Charmayne Sheer Referring Phys:  [place] Diagnosing Phys: Serafina Royals MD  Sonographer Comments: Technically difficult study due to poor echo windows. Image acquisition challenging due to respiratory motion. IMPRESSIONS  1. Left ventricular ejection fraction, by estimation, is 40 to 45%. The left ventricle has mildly decreased function. The left ventricle demonstrates global hypokinesis. The left ventricular internal cavity size was mildly dilated. Left ventricular diastolic parameters were normal.  2. Right ventricular systolic function is normal. The right ventricular size is normal.  3. Left atrial size was mildly dilated.  4. Right atrial size was mildly dilated.  5. The mitral valve is normal in structure. Mild to moderate mitral valve regurgitation.  6. Tricuspid valve regurgitation is mild to moderate.  7. The aortic valve is normal in structure. Aortic valve regurgitation is not visualized. FINDINGS  Left Ventricle: Left ventricular ejection fraction, by estimation, is 40 to 45%. The left ventricle has mildly decreased function. The left ventricle demonstrates global hypokinesis. Definity contrast agent was given IV to delineate the left ventricular  endocardial borders. The left ventricular internal cavity size was mildly dilated. There is no left ventricular hypertrophy. Left ventricular diastolic parameters were normal. Right Ventricle: The right ventricular size is normal. No increase in right ventricular  wall thickness. Right ventricular systolic function is normal. Left Atrium: Left atrial size was mildly dilated. Right Atrium: Right atrial size was mildly dilated. Pericardium: There is no evidence of pericardial effusion. Mitral Valve: The  mitral valve is normal in structure. Mild to moderate mitral valve regurgitation. MV peak gradient, 6.6 mmHg. The mean mitral valve gradient is 4.0 mmHg. Tricuspid Valve: The tricuspid valve is normal in structure. Tricuspid valve regurgitation is mild to moderate. Aortic Valve: The aortic valve is normal in structure. Aortic valve regurgitation is not visualized. Aortic valve mean gradient measures 11.0 mmHg. Aortic valve peak gradient measures 19.0 mmHg. Aortic valve area, by VTI measures 2.27 cm. Pulmonic Valve: The pulmonic valve was normal in structure. Pulmonic valve regurgitation is trivial. Aorta: The aortic root and ascending aorta are structurally normal, with no evidence of dilitation. IAS/Shunts: No atrial level shunt detected by color flow Doppler.  LEFT VENTRICLE PLAX 2D LVIDd:         5.23 cm   Diastology LVIDs:         4.07 cm   LV e' medial:    5.33 cm/s LV PW:         1.39 cm   LV E/e' medial:  18.0 LV IVS:        1.11 cm   LV e' lateral:   5.66 cm/s LVOT diam:     2.50 cm   LV E/e' lateral: 16.9 LV SV:         116 LV SV Index:   55 LVOT Area:     4.91 cm  RIGHT VENTRICLE RV Basal diam:  4.45 cm LEFT ATRIUM           Index        RIGHT ATRIUM           Index LA diam:      3.70 cm 1.76 cm/m   RA Area:     17.10 cm LA Vol (A4C): 73.3 ml 34.82 ml/m  RA Volume:   40.00 ml  19.00 ml/m  AORTIC VALVE                     PULMONIC VALVE AV Area (Vmax):    2.61 cm      PV Vmax:       0.87 m/s AV Area (Vmean):   2.33 cm      PV Vmean:      57.000 cm/s AV Area (VTI):     2.27 cm      PV VTI:        0.157 m AV Vmax:           218.00 cm/s   PV Peak grad:  3.0 mmHg AV Vmean:          160.000 cm/s  PV Mean grad:  2.0 mmHg AV VTI:            0.513 m AV Peak Grad:      19.0 mmHg AV Mean Grad:      11.0 mmHg LVOT Vmax:         116.00 cm/s LVOT Vmean:        75.800 cm/s LVOT VTI:          0.237 m LVOT/AV VTI ratio: 0.46  AORTA Ao Root diam: 3.40 cm MITRAL VALVE                TRICUSPID VALVE MV Area (PHT):  3.44 cm     TR Peak  grad:   43.6 mmHg MV Area VTI:   3.32 cm     TR Vmax:        330.00 cm/s MV Peak grad:  6.6 mmHg MV Mean grad:  4.0 mmHg     SHUNTS MV Vmax:       1.28 m/s     Systemic VTI:  0.24 m MV Vmean:      93.5 cm/s    Systemic Diam: 2.50 cm MV Decel Time: 221 msec MV E velocity: 95.75 cm/s MV A velocity: 106.00 cm/s MV E/A ratio:  0.90 Serafina Royals MD Electronically signed by Serafina Royals MD Signature Date/Time: 05/29/2021/12:28:40 PM    Final     Assessment:  # Acute on chronic anemia - suspect multifactorial- ckd, on plavix, gross hematuria - No signs of GIB at this time - UGI study negative for ulceration. 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 - FOBT is not indicated as this would not change management and not indicated inpatient No plan for endoscopic evaluation at this time. Manage conservatively and supportive care. Plavix currently being held Monitor H&H.  Goal hgb btwn 8-10 given cardiac hx. Transfusion and resuscitation as per primary team Avoid frequent lab draws to prevent lab induced anemia Supportive care and antiemetics as per primary team Maintain two sites IV access Avoid nsaids Monitor for GIB.  GI to sign off. Available as needed.   Should any acute issues arise, Dr. Vicente Males will be covering the inpatient GI service over the weekend if needed.  I personally performed the service.   Management of other medical comorbidities as per  primary team  Thank you for allowing [place] to participate in this patient's care. Please don't hesitate to call if any questions or concerns arise.   Annamaria Helling, DO [facility]  Portions of the record may have been created with voice recognition software. Occasional wrong-word or 'sound-a-like' substitutions may have occurred due to the inherent limitations of voice recognition software.  Read the chart carefully and recognize, using context, where substitutions may have occurred.

## 2021-05-30 NOTE — Progress Notes (Signed)
°   05/30/21 1400  Clinical Encounter Type  Visited With Patient  Visit Type Initial  Referral From Chaplain  Consult/Referral To [facility] visited with patient and provided emotional and spiritual support. Patient was upbeat and joking with Chaplain and nurse. Patient appreciated [place] visit.

## 2021-05-31 DIAGNOSIS — R079 Chest pain, unspecified: Secondary | ICD-10-CM | POA: Diagnosis not present

## 2021-05-31 LAB — BASIC METABOLIC PANEL
Anion gap: 11 (ref 5–15)
BUN: 68 mg/dL — ABNORMAL HIGH (ref 8–23)
CO2: 24 mmol/L (ref 22–32)
Calcium: 8.6 mg/dL — ABNORMAL LOW (ref 8.9–10.3)
Chloride: 111 mmol/L (ref 98–111)
Creatinine, Ser: 3.49 mg/dL — ABNORMAL HIGH (ref 0.61–1.24)
GFR, Estimated: 17 mL/min — ABNORMAL LOW (ref >60)
Glucose, Bld: 114 mg/dL — ABNORMAL HIGH (ref 70–99)
Potassium: 3.6 mmol/L (ref 3.5–5.1)
Sodium: 146 mmol/L — ABNORMAL HIGH (ref 135–145)

## 2021-05-31 LAB — CBC
HCT: 26.5 % — ABNORMAL LOW (ref 39.0–52.0)
Hemoglobin: 8.5 g/dL — ABNORMAL LOW (ref 13.0–17.0)
MCH: 29 pg (ref 26.0–34.0)
MCHC: 32.1 g/dL (ref 30.0–36.0)
MCV: 90.4 fL (ref 80.0–100.0)
Platelets: 121 10*3/uL — ABNORMAL LOW (ref 150–400)
RBC: 2.93 MIL/uL — ABNORMAL LOW (ref 4.22–5.81)
RDW: 14.6 % (ref 11.5–15.5)
WBC: 6.9 10*3/uL (ref 4.0–10.5)
nRBC: 0 % (ref 0.0–0.2)

## 2021-05-31 LAB — BPAM RBC
Blood Product Expiration Date: 202303172359
Blood Product Expiration Date: 202303212359
ISSUE DATE / TIME: 202302160131
ISSUE DATE / TIME: 202302171256
Unit Type and Rh: 5100
Unit Type and Rh: 5100

## 2021-05-31 LAB — TYPE AND SCREEN
ABO/RH(D): O POS
Antibody Screen: NEGATIVE
Unit division: 0
Unit division: 0

## 2021-05-31 LAB — MAGNESIUM: Magnesium: 2 mg/dL (ref 1.7–2.4)

## 2021-05-31 MED ORDER — CARVEDILOL 12.5 MG PO TABS
12.5000 mg | ORAL_TABLET | Freq: Two times a day (BID) | ORAL | Status: DC
Start: 2021-05-31 — End: 2021-06-02
  Administered 2021-05-31 – 2021-06-02 (×5): 12.5 mg via ORAL
  Filled 2021-05-31 (×5): qty 1

## 2021-05-31 MED ORDER — CARVEDILOL 12.5 MG PO TABS: 12.5000 mg | ORAL_TABLET | Freq: Two times a day (BID) | ORAL | Status: DC

## 2021-05-31 NOTE — Progress Notes (Signed)
PROGRESS NOTE    CLAUDELL [name]  JAS:[id number] DOB: [birth year] DOA: 05/28/2021 PCP: Tracie Harrier, MD  [id number]   Assessment & Plan:   Principal Problem:   Chest pain Active Problems:   SOB (shortness of breath)   CAD (coronary artery disease)   HTN (hypertension)   GERD (gastroesophageal reflux disease)   NSTEMI (non-ST elevated myocardial infarction) ([place])   Current tobacco use   S/P ICD (internal cardiac defibrillator) procedure   Anemia    Peter [name] is a 81 y.o.  male with medical history significant for systolic and diastolic heart failure, CAD s/p multiple stents, hypertension, status post AICD who presented with chest pain and dyspnea.     # Chest pain, ACS ruled out # Troponin elevation 2/2 demand ischemia # Hx of CAD s/p stents # Cardiomyopathy s/p AICD --trop up to 1600's.  Per cardio, Dr. Nehemiah Massed, avoided heparin gtt or anti-plt Plan: --no cath planned due to severe CKD --cont isosorbide for chest pain --increase coreg to 12.5 mg BID by cardio  # acute on chronic anemia --Hgb 7.7 on presentation, baseline 10-11 from a month ago.  Pt reported 2 episodes of black stools PTA.  No prior GI workup.  Took plavix at home.  Hematuria occurred after presentation. --s/p 2u pRBC  --GI consulted --UGI study, negative for ulceration. Potentially some gastritis Plan: --monitor Hgb and transfuse with goal >8  # Acute urinary retention # BPH --Had prior hx of urinary retention, but current episode occurred after presentation.  Last saw Dr. Diamantina Providence in Nov 2022. --Had 700 ml in bladder scan x2, so Foley inserted. Plan: --cont Foley --cont home finasteride and Flomax --outpatient followup with urology  # Hematuria, not POA --occurred only after I/O cath, in the setting of taking plavix at home. Plan: --cont to hold plavix --intermittent bladder irrigation per RN  HTN --BP elevated --cont home Imdur and amlodipine --cont hydralazine 100 mg TID  (increased) --increase home coreg to 12.5 mg BID, per cardio   DVT prophylaxis: SCD/Compression stockings Code Status: Full code  Family Communication:   Level of care: Progressive Dispo:   The patient is from: home Anticipated d/c is to: home Anticipated d/c date is: 2-3 days Patient currently is not medically ready to d/c due to: need hematuria to resolve and Hgb to stablize   Subjective and Interval History:  Pt continued to feel dyspnea.  Also reported feeling worse after breakfast today.  Reported an episode of large solid black BM.   Objective: Vitals:   05/31/21 0741 05/31/21 1111 05/31/21 1510 05/31/21 1932  BP: (!) 157/84 137/76 (!) 127/52 (!) 126/47  Pulse: 74 65 62 62  Resp: 18 17 17 16   Temp: 97.9 F (36.6 C) 98 F (36.7 C) 98 F (36.7 C) 98.9 F (37.2 C)  TempSrc:  Oral Oral   SpO2: 99% 98% 97% 98%  Weight:      Height:        Intake/Output Summary (Last 24 hours) at 05/31/2021 2007 Last data filed at 05/31/2021 1822 Gross per 24 hour  Intake 480 ml  Output 950 ml  Net -470 ml   Filed Weights   05/28/21 1836  Weight: 92.5 kg    Examination:   Constitutional: NAD, AAOx3 HEENT: conjunctivae and lids normal, EOMI CV: No cyanosis.   RESP: normal respiratory effort, on 2L Extremities: No effusions, edema in BLE SKIN: warm, dry Neuro: II - XII grossly intact.   Psych: Normal mood and affect.  Appropriate  judgement and reason  Foley present draining bloody urine   Data Reviewed: I have personally reviewed following labs and imaging studies  CBC: Recent Labs  Lab 05/28/21 1835 05/29/21 1023 05/30/21 0542 05/31/21 0547  WBC 7.3 6.9 6.6 6.9  HGB 7.7* 8.4* 7.9* 8.5*  HCT 24.1* 26.2* 24.4* 26.5*  MCV 92.7 90.0 90.7 90.4  PLT 130* 130* 128* 119*   Basic Metabolic Panel: Recent Labs  Lab 05/28/21 1835 05/29/21 1023 05/30/21 0542 05/31/21 0547  NA 143 143 143 146*  K 3.7 3.4* 3.7 3.6  CL 113* 112* 112* 111  CO2 24 23 24 24   GLUCOSE  119* 102* 102* 114*  BUN 75* 75* 73* 68*  CREATININE 3.49* 3.47* 3.63* 3.49*  CALCIUM 8.6* 8.6* 8.5* 8.6*  MG  --  2.0 1.9 2.0   GFR: Estimated Creatinine Clearance: 19 mL/min (A) (by C-G formula based on SCr of 3.49 mg/dL (H)). Liver Function Tests: Recent Labs  Lab 05/28/21 1835  AST 16  ALT 15  ALKPHOS 75  BILITOT 0.6  PROT 5.2*  ALBUMIN 3.0*   No results for input(s): LIPASE, AMYLASE in the last 168 hours. No results for input(s): AMMONIA in the last 168 hours. Coagulation Profile: Recent Labs  Lab 05/28/21 2109  INR 1.3*   Cardiac Enzymes: No results for input(s): CKTOTAL, CKMB, CKMBINDEX, TROPONINI in the last 168 hours. BNP (last 3 results) No results for input(s): PROBNP in the last 8760 hours. HbA1C: No results for input(s): HGBA1C in the last 72 hours. CBG: No results for input(s): GLUCAP in the last 168 hours. Lipid Profile: Recent Labs    05/29/21 0838  CHOL 110  HDL 29*  LDLCALC 59  TRIG 110  CHOLHDL 3.8   Thyroid Function Tests: No results for input(s): TSH, T4TOTAL, FREET4, T3FREE, THYROIDAB in the last 72 hours. Anemia Panel: Recent Labs    05/28/21 2109  FERRITIN 111  TIBC 188*  IRON 38*  RETICCTPCT 2.0   Sepsis Labs: No results for input(s): PROCALCITON, LATICACIDVEN in the last 168 hours.  Recent Results (from the past 240 hour(s))  Resp Panel by RT-PCR (Flu A&B, Covid) Nasopharyngeal Swab     Status: None   Collection Time: 05/28/21  7:10 PM   Specimen: Nasopharyngeal Swab; Nasopharyngeal(NP) swabs in vial transport medium  Result Value Ref Range Status   SARS Coronavirus 2 by RT PCR NEGATIVE NEGATIVE Final    Comment: (NOTE) SARS-CoV-2 target nucleic acids are NOT DETECTED.  The SARS-CoV-2 RNA is generally detectable in upper respiratory specimens during the acute phase of infection. The lowest concentration of SARS-CoV-2 viral copies this assay can detect is 138 copies/mL. A negative result does not preclude  SARS-Cov-2 infection and should not be used as the sole basis for treatment or other patient management decisions. A negative result may occur with  improper specimen collection/handling, submission of specimen other than nasopharyngeal swab, presence of viral mutation(s) within the areas targeted by this assay, and inadequate number of viral copies(<138 copies/mL). A negative result must be combined with clinical observations, patient history, and epidemiological information. The expected result is Negative.  Fact Sheet for Patients:  EntrepreneurPulse.com.au  Fact Sheet for Healthcare Providers:  IncredibleEmployment.be  This test is no t yet approved or cleared by the [place] FDA and  has been authorized for detection and/or diagnosis of SARS-CoV-2 by FDA under an Emergency Use Authorization (EUA). This EUA will remain  in effect (meaning this test can be used) for the duration of  the COVID-19 declaration under Section 564(b)(1) of the Act, 21 U.S.C.section 360bbb-3(b)(1), unless the authorization is terminated  or revoked sooner.       Influenza A by PCR NEGATIVE NEGATIVE Final   Influenza B by PCR NEGATIVE NEGATIVE Final    Comment: (NOTE) The Xpert Xpress SARS-CoV-2/FLU/RSV plus assay is intended as an aid in the diagnosis of influenza from Nasopharyngeal swab specimens and should not be used as a sole basis for treatment. Nasal washings and aspirates are unacceptable for Xpert Xpress SARS-CoV-2/FLU/RSV testing.  Fact Sheet for Patients: EntrepreneurPulse.com.au  Fact Sheet for Healthcare Providers: IncredibleEmployment.be  This test is not yet approved or cleared by the [place] FDA and has been authorized for detection and/or diagnosis of SARS-CoV-2 by FDA under an Emergency Use Authorization (EUA). This EUA will remain in effect (meaning this test can be used) for the duration of  the COVID-19 declaration under Section 564(b)(1) of the Act, 21 U.S.C. section 360bbb-3(b)(1), unless the authorization is terminated or revoked.  Performed at [facility], [address] [place], [place] [address]       Radiology Studies: DG UGI W SINGLE CM (SOL OR THIN BA)  Result Date: 05/30/2021 CLINICAL DATA:  Upper GI bleed. Evaluate for mucosal ulceration/defect. EXAM: DG UGI W SINGLE CM TECHNIQUE: Scout radiograph was obtained. Single contrast examination was performed using thin liquid barium. This exam was performed by Candiss Norse, PA, and was supervised and interpreted by myself. FLUOROSCOPY TIME:  Radiation Exposure Index (as provided by the fluoroscopic device): 44.4 mGy COMPARISON:  Chest CT 05/27/2021.  Abdominopelvic CT 04/04/2021. FINDINGS: Scout Radiograph: Due to reported contrast allergy, the study was performed with thin barium after obtaining supine and erect views of the abdomen. The bowel gas pattern is nonobstructive. There is no evidence of pneumoperitoneum. Extensive postsurgical changes are noted, related to previous aortoiliac stent grafting. The patient has an AICD. Study was performed in the supine and semi erect positions. Patient was unable to stand or lie prone. The patient swallowed the barium without difficulty. The esophageal motility appears within normal limits. There is no evidence of stricture, mass or ulceration. Mild gastric fold thickening is noted which could indicate gastritis. No evidence of mucosal ulceration. The duodenum appears normal. IMPRESSION: 1. No specific cause for upper GI bleeding identified. There is no evidence of mucosal ulceration. 2. Gastric fold thickening which could indicate gastritis. 3. No significant esophageal abnormality. Electronically Signed   By: Richardean Sale M.D.   On: 05/30/2021 13:12     Scheduled Meds:  amLODipine  10 mg Oral Daily   carvedilol  12.5 mg Oral BID WC   Chlorhexidine Gluconate Cloth   6 each Topical Daily   citalopram  20 mg Oral Daily   finasteride  5 mg Oral Daily   hydrALAZINE  100 mg Oral TID   isosorbide mononitrate  30 mg Oral Daily   pantoprazole (PROTONIX) IV  40 mg Intravenous Q12H   tamsulosin  0.4 mg Oral Daily   Continuous Infusions:   LOS: 3 days     Enzo Bi, MD [facility] If 7PM-7AM, please contact night-coverage 05/31/2021, 8:07 PM

## 2021-05-31 NOTE — Progress Notes (Signed)
[facility] Encounter Note  Patient: Peter [name] / Admit Date: 05/28/2021 / Date of Encounter: 05/31/2021, 8:27 AM   Subjective: Patient is overall doing relatively well since yesterday from the cardiovascular standpoint.  There is been no evidence of further chest discomfort.  Troponin level peaked at 1696 possibly consistent with demand ischemia from significant anemia with hemoglobin down in the 7.7 range as well as a glomerular filtration rate of 17.  No evidence of overt changes by EKG although EKG has been abnormal before with left bundle branch block with previous congestive heart failure and defibrillator placement with CRT  Echocardiogram showing mild LV systolic dysfunction unchanged from before.  The patient did have significant amount of hematuria but no evidence of GI bleed as a source of his significant anemia.  Plavix aspirin heparin have all been held with continued issues of hematuria.  Current blood pressure has been slightly elevated but reasonable before on appropriate medication management.  There has been improvements with packed red blood cells with a hemoglobin of 8.6  Review of Systems: Positive for: Shortness of breath Negative for: Vision change, hearing change, syncope, dizziness, nausea, vomiting,diarrhea, bloody stool, stomach pain, cough, congestion, diaphoresis, urinary frequency, urinary pain,skin lesions, skin rashes Others previously listed  Objective: Telemetry: Normal sinus rhythm with bundle branch block Physical Exam: Blood pressure (!) 157/84, pulse 74, temperature 97.9 F (36.6 C), resp. rate 18, height 5\' 10"  (1.778 m), weight 92.5 kg, SpO2 99 %. Body mass index is 29.27 kg/m. General: Well developed, well nourished, in no acute distress. Head: Normocephalic, atraumatic, sclera non-icteric, no xanthomas, nares are without discharge. Neck: No apparent masses Lungs: Normal respirations with no wheezes, few rhonchi, no rales ,  no crackles   Heart: Regular rate and rhythm, normal S1 S2, no murmur, no rub, no gallop, PMI is normal size and placement, carotid upstroke normal without bruit, jugular venous pressure normal Abdomen: Soft, non-tender, non-distended with normoactive bowel sounds. No hepatosplenomegaly. Abdominal aorta is normal size without bruit Extremities: No edema, no clubbing, no cyanosis, no ulcers,  Peripheral: 2+ radial, 2+ femoral, 2+ dorsal pedal pulses Neuro: Alert and oriented. Moves all extremities spontaneously. Psych:  Responds to questions appropriately with a normal affect.   Intake/Output Summary (Last 24 hours) at 05/31/2021 0827 Last data filed at 05/31/2021 0600 Gross per 24 hour  Intake 378 ml  Output 1300 ml  Net -922 ml     Inpatient Medications:   sodium chloride   Intravenous Once   amLODipine  10 mg Oral Daily   carvedilol  6.25 mg Oral BID WC   Chlorhexidine Gluconate Cloth  6 each Topical Daily   citalopram  20 mg Oral Daily   finasteride  5 mg Oral Daily   hydrALAZINE  100 mg Oral TID   isosorbide mononitrate  30 mg Oral Daily   pantoprazole (PROTONIX) IV  40 mg Intravenous Q12H   tamsulosin  0.4 mg Oral Daily   Infusions:   Labs: Recent Labs    05/30/21 0542 05/31/21 0547  NA 143 146*  K 3.7 3.6  CL 112* 111  CO2 24 24  GLUCOSE 102* 114*  BUN 73* 68*  CREATININE 3.63* 3.49*  CALCIUM 8.5* 8.6*  MG 1.9 2.0    Recent Labs    05/28/21 1835  AST 16  ALT 15  ALKPHOS 75  BILITOT 0.6  PROT 5.2*  ALBUMIN 3.0*    Recent Labs    05/30/21 0542 05/31/21 0547  WBC  6.6 6.9  HGB 7.9* 8.5*  HCT 24.4* 26.5*  MCV 90.7 90.4  PLT 128* 121*    No results for input(s): CKTOTAL, CKMB, TROPONINI in the last 72 hours. Invalid input(s): POCBNP No results for input(s): HGBA1C in the last 72 hours.   Weights: Filed Weights   05/28/21 1836  Weight: 92.5 kg     Radiology/Studies:  CT CHEST WO CONTRAST  Result Date: 05/28/2021 CLINICAL DATA:   Shortness of breath. History of congestive heart failure. Substantial smoking history. EXAM: CT CHEST WITHOUT CONTRAST TECHNIQUE: Multidetector CT imaging of the chest was performed following the standard protocol without IV contrast. RADIATION DOSE REDUCTION: This exam was performed according to the departmental dose-optimization program which includes automated exposure control, adjustment of the mA and/or kV according to patient size and/or use of iterative reconstruction technique. COMPARISON:  Chest radiograph 05/01/2021 and chest CT 04/12/2021 FINDINGS: Cardiovascular: Coronary, aortic arch, and branch vessel atherosclerotic vascular disease. Coronary stents. AICD noted. 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 Small anterior pericardial effusion. Borderline cardiomegaly. Descending thoracic aortic and upper abdominal aortic stent. Mediastinum/Nodes: AP window lymph node 0.8 cm in short axis on image 62 series 2, stable. Low-density subcarinal lymph nodes including a 1.0 cm node on image 89 series 2, formerly the same. Other scattered small mediastinal lymph nodes are present. Lungs/Pleura: Small bilateral pleural effusions although both are increased from 04/12/2021. Centrilobular emphysema. Mild secondary pulmonary lobular septal accentuation of the apices. Bilateral airway thickening. 1.0 by 0.6 cm stable ground-glass density nodule in the apicoposterior segment left upper lobe on image 22 series 3. Ground-glass density peribronchovascular nodule in the left upper lobe on image 33 series 3 is stable at 1.2 by 0.7 cm. Ground-glass density 1.7 by 1.0 cm right lower lobe nodule on image 72 series 3. Increased density of a 0.8 by 0.5 cm right upper lobe sub solid pulmonary nodule on image 62 series 3 compared to the 04/12/2021 exam. Mildly increased atelectasis in the left lower lobe compared to previous. Upper Abdomen: Small depending  gallstones in the gallbladder. Upper abdominal aortic stent with stents in the proximal celiac trunk, SMA, and renal arteries. We partially image a ventral hernia containing adipose tissue on image 185 series 2. Musculoskeletal: Bilateral gynecomastia. 2.0 by 0.6 cm sclerotic lesion in the right posterolateral sixth rib, no change from 12/14/2011, considered benign. IMPRESSION: 1. Small but mildly worsened bilateral pleural effusions with associated passive atelectasis. 2. Airway thickening is present, suggesting bronchitis or reactive airways disease. 3. Several ground-glass density pulmonary nodules are present including two left apical nodule stable from 04/12/2021 (questionable precursors of these lesions on 12/14/2011) and a right upper lobe nodule which has increased in density compared to that time, as well as a stable right lower lobe ground-glass density pulmonary nodule. Low-grade adenocarcinoma is a possibility and surveillance is recommended. 4. Aortic Atherosclerosis (ICD10-I70.0) and Emphysema (ICD10-J43.9). Coronary atherosclerosis. 5. 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 Recommend semi-annual imaging followup by CTA or MRA and referral to cardiothoracic surgery if not already obtained. This recommendation follows 2010 ACCF/AHA/AATS/ACR/ASA/SCA/SCAI/SIR/STS/SVM Guidelines for the Diagnosis and Management of Patients With Thoracic Aortic Disease. Circulation. 2010; 121: I786-V67. Aortic aneurysm NOS (ICD10-I71.9) 6. Coronary and abdominal aortic stents along with stents in the celiac trunk, SMA, and proximal renal arteries. AICD noted. 7. Small anterior pericardial effusion. 8. Cholelithiasis. 9. Partially imaged ventral hernia containing adipose tissue. Electronically Signed   By: Thayer Jew  Janeece Fitting M.D.   On: 05/28/2021 08:44   NM Pulmonary Perfusion  Result Date: 05/01/2021 CLINICAL DATA:  Shortness of breath EXAM: NUCLEAR MEDICINE PERFUSION LUNG SCAN TECHNIQUE: Perfusion images were obtained in  multiple projections after intravenous injection of radiopharmaceutical. Ventilation scans intentionally deferred if perfusion scan and chest x-ray adequate for interpretation during COVID 19 epidemic. RADIOPHARMACEUTICALS:  4.1 mCi Tc-61m MAA IV COMPARISON:  Same day chest radiograph FINDINGS: Heterogeneous perfusion of the lung with multiple small subsegmental perfusion defects. IMPRESSION: Nondiagnostic (low or intermediate probability) Electronically Signed   By: Yetta Glassman M.D.   On: 05/01/2021 14:48   DG Chest Portable 1 View  Result Date: 05/28/2021 CLINICAL DATA:  Shortness of breath, chest pain EXAM: PORTABLE CHEST 1 VIEW COMPARISON:  05/01/2021 FINDINGS: Lungs are clear.  No pleural effusion or pneumothorax. The heart is normal in size.  Left subclavian ICD. Mild degenerative changes of the mid thoracic spine. IMPRESSION: No evidence of acute cardiopulmonary disease. Electronically Signed   By: Julian Hy M.D.   On: 05/28/2021 19:04   DG Chest Port 1 View  Result Date: 05/01/2021 CLINICAL DATA:  Shortness of breath EXAM: PORTABLE CHEST 1 VIEW COMPARISON:  Previous studies including the examination of 04/27/2021 FINDINGS: Transverse diameter of heart is increased. Pacemaker battery is seen in the left infraclavicular region. Biventricular pacer leads are noted in place. Central pulmonary vessels are more prominent. Subtle increase in interstitial markings are seen in the lower lung fields, more so on the right side. Costophrenic angles are clear. There is no pneumothorax. IMPRESSION: Cardiomegaly. Central pulmonary vessels are prominent without signs of alveolar pulmonary edema. Increased interstitial markings are seen in the both lower lung fields, more so on the right side which may suggest mild asymmetric interstitial pulmonary edema or interstitial pneumonia. Electronically Signed   By: Elmer Picker M.D.   On: 05/01/2021 13:30   DG UGI W SINGLE CM (SOL OR THIN BA)  Result  Date: 05/30/2021 CLINICAL DATA:  Upper GI bleed. Evaluate for mucosal ulceration/defect. EXAM: DG UGI W SINGLE CM TECHNIQUE: Scout radiograph was obtained. Single contrast examination was performed using thin liquid barium. This exam was performed by Candiss Norse, PA, and was supervised and interpreted by myself. FLUOROSCOPY TIME:  Radiation Exposure Index (as provided by the fluoroscopic device): 44.4 mGy COMPARISON:  Chest CT 05/27/2021.  Abdominopelvic CT 04/04/2021. FINDINGS: Scout Radiograph: Due to reported contrast allergy, the study was performed with thin barium after obtaining supine and erect views of the abdomen. The bowel gas pattern is nonobstructive. There is no evidence of pneumoperitoneum. Extensive postsurgical changes are noted, related to previous aortoiliac stent grafting. The patient has an AICD. Study was performed in the supine and semi erect positions. Patient was unable to stand or lie prone. The patient swallowed the barium without difficulty. The esophageal motility appears within normal limits. There is no evidence of stricture, mass or ulceration. Mild gastric fold thickening is noted which could indicate gastritis. No evidence of mucosal ulceration. The duodenum appears normal. IMPRESSION: 1. No specific cause for upper GI bleeding identified. There is no evidence of mucosal ulceration. 2. Gastric fold thickening which could indicate gastritis. 3. No significant esophageal abnormality. Electronically Signed   By: Richardean Sale M.D.   On: 05/30/2021 13:12   ECHOCARDIOGRAM COMPLETE  Result Date: 05/29/2021    ECHOCARDIOGRAM REPORT   Patient Name:   Peter [name] Date of Exam: 05/29/2021 Medical Rec #:  [id number]        Height:  70.0 in Accession #:    [id number]       Weight:       204.0 lb Date of Birth:  1940-12-28       BSA:          2.105 m Patient Age:    24 years         BP:           166/66 mmHg Patient Gender: M                HR:           62 bpm. Exam  Location:  [facility] Procedure: 2D Echo, Color Doppler, Cardiac Doppler and Intracardiac            Opacification Agent Indications:     R07.9 Chest Pain  History:         Patient has prior history of Echocardiogram examinations, most                  recent 04/04/2021. CHF, CAD, Pacemaker; Risk Factors:HCL.  Sonographer:     Charmayne Sheer Referring Phys:  [place] Diagnosing Phys: Serafina Royals MD  Sonographer Comments: Technically difficult study due to poor echo windows. Image acquisition challenging due to respiratory motion. IMPRESSIONS  1. Left ventricular ejection fraction, by estimation, is 40 to 45%. The left ventricle has mildly decreased function. The left ventricle demonstrates global hypokinesis. The left ventricular internal cavity size was mildly dilated. Left ventricular diastolic parameters were normal.  2. Right ventricular systolic function is normal. The right ventricular size is normal.  3. Left atrial size was mildly dilated.  4. Right atrial size was mildly dilated.  5. The mitral valve is normal in structure. Mild to moderate mitral valve regurgitation.  6. Tricuspid valve regurgitation is mild to moderate.  7. The aortic valve is normal in structure. Aortic valve regurgitation is not visualized. FINDINGS  Left Ventricle: Left ventricular ejection fraction, by estimation, is 40 to 45%. The left ventricle has mildly decreased function. The left ventricle demonstrates global hypokinesis. Definity contrast agent was given IV to delineate the left ventricular  endocardial borders. The left ventricular internal cavity size was mildly dilated. There is no left ventricular hypertrophy. Left ventricular diastolic parameters were normal. Right Ventricle: The right ventricular size is normal. No increase in right ventricular wall thickness. Right ventricular systolic function is normal. Left Atrium: Left atrial size was mildly dilated. Right Atrium: Right atrial size was mildly dilated.  Pericardium: There is no evidence of pericardial effusion. Mitral Valve: The mitral valve is normal in structure. Mild to moderate mitral valve regurgitation. MV peak gradient, 6.6 mmHg. The mean mitral valve gradient is 4.0 mmHg. Tricuspid Valve: The tricuspid valve is normal in structure. Tricuspid valve regurgitation is mild to moderate. Aortic Valve: The aortic valve is normal in structure. Aortic valve regurgitation is not visualized. Aortic valve mean gradient measures 11.0 mmHg. Aortic valve peak gradient measures 19.0 mmHg. Aortic valve area, by VTI measures 2.27 cm. Pulmonic Valve: The pulmonic valve was normal in structure. Pulmonic valve regurgitation is trivial. Aorta: The aortic root and ascending aorta are structurally normal, with no evidence of dilitation. IAS/Shunts: No atrial level shunt detected by color flow Doppler.  LEFT VENTRICLE PLAX 2D LVIDd:         5.23 cm   Diastology LVIDs:         4.07 cm   LV e' medial:    5.33 cm/s LV PW:  1.39 cm   LV E/e' medial:  18.0 LV IVS:        1.11 cm   LV e' lateral:   5.66 cm/s LVOT diam:     2.50 cm   LV E/e' lateral: 16.9 LV SV:         116 LV SV Index:   55 LVOT Area:     4.91 cm  RIGHT VENTRICLE RV Basal diam:  4.45 cm LEFT ATRIUM           Index        RIGHT ATRIUM           Index LA diam:      3.70 cm 1.76 cm/m   RA Area:     17.10 cm LA Vol (A4C): 73.3 ml 34.82 ml/m  RA Volume:   40.00 ml  19.00 ml/m  AORTIC VALVE                     PULMONIC VALVE AV Area (Vmax):    2.61 cm      PV Vmax:       0.87 m/s AV Area (Vmean):   2.33 cm      PV Vmean:      57.000 cm/s AV Area (VTI):     2.27 cm      PV VTI:        0.157 m AV Vmax:           218.00 cm/s   PV Peak grad:  3.0 mmHg AV Vmean:          160.000 cm/s  PV Mean grad:  2.0 mmHg AV VTI:            0.513 m AV Peak Grad:      19.0 mmHg AV Mean Grad:      11.0 mmHg LVOT Vmax:         116.00 cm/s LVOT Vmean:        75.800 cm/s LVOT VTI:          0.237 m LVOT/AV VTI ratio: 0.46  AORTA Ao  Root diam: 3.40 cm MITRAL VALVE                TRICUSPID VALVE MV Area (PHT): 3.44 cm     TR Peak grad:   43.6 mmHg MV Area VTI:   3.32 cm     TR Vmax:        330.00 cm/s MV Peak grad:  6.6 mmHg MV Mean grad:  4.0 mmHg     SHUNTS MV Vmax:       1.28 m/s     Systemic VTI:  0.24 m MV Vmean:      93.5 cm/s    Systemic Diam: 2.50 cm MV Decel Time: 221 msec MV E velocity: 95.75 cm/s MV A velocity: 106.00 cm/s MV E/A ratio:  0.90 Serafina Royals MD Electronically signed by Serafina Royals MD Signature Date/Time: 05/29/2021/12:28:40 PM    Final      Assessment and Recommendation  81 y.o. male with known coronary artery disease hypertension hyperlipidemia chronic obstructive pulmonary disease diabetes and cardiomyopathy status post previous PCI and stent placement with defibrillator pression and CRT-D on appropriate previous medication management with chest pain most consistent with demand ischemia and elevated troponin consistent with demand ischemia non-ST elevation myocardial infarction but no evidence of acute coronary syndrome 1.  Continuation of isosorbide for concerns of chest pain and demand ischemia and coronary artery disease 2.  Continuation of carvedilol amlodipine for hypertension control and treatment and will consider increasing carvedilol to 12.5 mg twice per day due to hypertension concerns 3.  Careful use of any diuretics due to severe chronic kidney disease exacerbation concerns 4.  Proceed to further work-up significant causes of anemia and possible hematuria as a source 5.  No further cardiac diagnostics necessary at this time 6.  Continue ambulation and follow-up for improvements of symptoms listed above  Signed, Serafina Royals M.D. FACC

## 2021-06-01 DIAGNOSIS — R310 Gross hematuria: Secondary | ICD-10-CM

## 2021-06-01 LAB — BASIC METABOLIC PANEL
Anion gap: 6 (ref 5–15)
BUN: 72 mg/dL — ABNORMAL HIGH (ref 8–23)
CO2: 27 mmol/L (ref 22–32)
Calcium: 8.2 mg/dL — ABNORMAL LOW (ref 8.9–10.3)
Chloride: 109 mmol/L (ref 98–111)
Creatinine, Ser: 3.65 mg/dL — ABNORMAL HIGH (ref 0.61–1.24)
GFR, Estimated: 16 mL/min — ABNORMAL LOW (ref >60)
Glucose, Bld: 103 mg/dL — ABNORMAL HIGH (ref 70–99)
Potassium: 3.6 mmol/L (ref 3.5–5.1)
Sodium: 142 mmol/L (ref 135–145)

## 2021-06-01 LAB — CBC
HCT: 25.2 % — ABNORMAL LOW (ref 39.0–52.0)
Hemoglobin: 8 g/dL — ABNORMAL LOW (ref 13.0–17.0)
MCH: 29.1 pg (ref 26.0–34.0)
MCHC: 31.7 g/dL (ref 30.0–36.0)
MCV: 91.6 fL (ref 80.0–100.0)
Platelets: 116 10*3/uL — ABNORMAL LOW (ref 150–400)
RBC: 2.75 MIL/uL — ABNORMAL LOW (ref 4.22–5.81)
RDW: 14.5 % (ref 11.5–15.5)
WBC: 5.8 10*3/uL (ref 4.0–10.5)
nRBC: 0 % (ref 0.0–0.2)

## 2021-06-01 LAB — PREPARE RBC (CROSSMATCH)

## 2021-06-01 LAB — MAGNESIUM: Magnesium: 2 mg/dL (ref 1.7–2.4)

## 2021-06-01 MED ORDER — SODIUM CHLORIDE 0.9% IV SOLUTION: Freq: Once | INTRAVENOUS | Status: AC

## 2021-06-01 MED ORDER — PANTOPRAZOLE SODIUM 40 MG PO TBEC
40.0000 mg | DELAYED_RELEASE_TABLET | Freq: Two times a day (BID) | ORAL | Status: DC
Start: 2021-06-01 — End: 2021-06-04
  Administered 2021-06-01 – 2021-06-04 (×6): 40 mg via ORAL
  Filled 2021-06-01 (×6): qty 1

## 2021-06-01 NOTE — Progress Notes (Signed)
PHARMACIST - PHYSICIAN COMMUNICATION  CONCERNING: IV to Oral Route Change Policy  RECOMMENDATION: This patient is receiving pantoprazole by the intravenous route.  Based on criteria approved by the Pharmacy and Therapeutics Committee, the intravenous medication(s) is/are being converted to the equivalent oral dose form(s).   DESCRIPTION: These criteria include: The patient is eating (either orally or via tube) and/or has been taking other orally administered medications for a least 24 hours The patient has no evidence of active gastrointestinal bleeding or impaired GI absorption (gastrectomy, short bowel, patient on TNA or NPO).  If you have questions about this conversion, please contact the [place], [facility] 06/01/2021 10:54 AM

## 2021-06-01 NOTE — Progress Notes (Signed)
[facility] Encounter Note  Patient: Peter [name] / Admit Date: 05/28/2021 / Date of Encounter: 06/01/2021, 10:10 AM   Subjective: Patient is overall doing relatively well since yesterday from the cardiovascular standpoint.  There is been no evidence of further chest discomfort.  Troponin level peaked at 1696 possibly consistent with demand ischemia from significant anemia with hemoglobin down in the 7.7 range as well as a glomerular filtration rate of 17.  No evidence of overt changes by EKG although EKG has been abnormal before with left bundle branch block with previous congestive heart failure and defibrillator placement with CRT  Echocardiogram showing mild LV systolic dysfunction unchanged from before.  The patient did have significant amount of hematuria but no evidence of GI bleed as a source of his significant anemia.  Plavix aspirin heparin have all been held with continued issues of hematuria but appears to be somewhat improved today.  Current blood pressure has been slightly elevated but reasonable before on appropriate medication management.  There has been improvements with packed red blood cells with a hemoglobin of above 8  Review of Systems: Positive for: Shortness of breath Negative for: Vision change, hearing change, syncope, dizziness, nausea, vomiting,diarrhea, bloody stool, stomach pain, cough, congestion, diaphoresis, urinary frequency, urinary pain,skin lesions, skin rashes Others previously listed  Objective: Telemetry: Normal sinus rhythm with bundle branch block Physical Exam: Blood pressure (!) 153/66, pulse (!) 49, temperature 97.7 F (36.5 C), resp. rate 19, height 5\' 10"  (1.778 m), weight 92.5 kg, SpO2 97 %. Body mass index is 29.27 kg/m. General: Well developed, well nourished, in no acute distress. Head: Normocephalic, atraumatic, sclera non-icteric, no xanthomas, nares are without discharge. Neck: No apparent masses Lungs: Normal  respirations with no wheezes, few rhonchi, no rales , no crackles   Heart: Regular rate and rhythm, normal S1 S2, no murmur, no rub, no gallop, PMI is normal size and placement, carotid upstroke normal without bruit, jugular venous pressure normal Abdomen: Soft, non-tender, non-distended with normoactive bowel sounds. No hepatosplenomegaly. Abdominal aorta is normal size without bruit Extremities: No edema, no clubbing, no cyanosis, no ulcers,  Peripheral: 2+ radial, 2+ femoral, 2+ dorsal pedal pulses Neuro: Alert and oriented. Moves all extremities spontaneously. Psych:  Responds to questions appropriately with a normal affect.   Intake/Output Summary (Last 24 hours) at 06/01/2021 1010 Last data filed at 05/31/2021 1822 Gross per 24 hour  Intake 480 ml  Output 350 ml  Net 130 ml     Inpatient Medications:   sodium chloride   Intravenous Once   amLODipine  10 mg Oral Daily   carvedilol  12.5 mg Oral BID WC   Chlorhexidine Gluconate Cloth  6 each Topical Daily   citalopram  20 mg Oral Daily   finasteride  5 mg Oral Daily   hydrALAZINE  100 mg Oral TID   isosorbide mononitrate  30 mg Oral Daily   pantoprazole (PROTONIX) IV  40 mg Intravenous Q12H   tamsulosin  0.4 mg Oral Daily   Infusions:   Labs: Recent Labs    05/31/21 0547 06/01/21 0429  NA 146* 142  K 3.6 3.6  CL 111 109  CO2 24 27  GLUCOSE 114* 103*  BUN 68* 72*  CREATININE 3.49* 3.65*  CALCIUM 8.6* 8.2*  MG 2.0 2.0    No results for input(s): AST, ALT, ALKPHOS, BILITOT, PROT, ALBUMIN in the last 72 hours.  Recent Labs    05/31/21 0547 06/01/21 0429  WBC 6.9 5.8  HGB 8.5* 8.0*  HCT 26.5* 25.2*  MCV 90.4 91.6  PLT 121* 116*    No results for input(s): CKTOTAL, CKMB, TROPONINI in the last 72 hours. Invalid input(s): POCBNP No results for input(s): HGBA1C in the last 72 hours.   Weights: Filed Weights   05/28/21 1836  Weight: 92.5 kg     Radiology/Studies:  CT CHEST WO CONTRAST  Result Date:  05/28/2021 CLINICAL DATA:  Shortness of breath. History of congestive heart failure. Substantial smoking history. EXAM: CT CHEST WITHOUT CONTRAST TECHNIQUE: Multidetector CT imaging of the chest was performed following the standard protocol without IV contrast. RADIATION DOSE REDUCTION: This exam was performed according to the departmental dose-optimization program which includes automated exposure control, adjustment of the mA and/or kV according to patient size and/or use of iterative reconstruction technique. COMPARISON:  Chest radiograph 05/01/2021 and chest CT 04/12/2021 FINDINGS: Cardiovascular: Coronary, aortic arch, and branch vessel atherosclerotic vascular disease. Coronary stents. AICD noted. 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 Small anterior pericardial effusion. Borderline cardiomegaly. Descending thoracic aortic and upper abdominal aortic stent. Mediastinum/Nodes: AP window lymph node 0.8 cm in short axis on image 62 series 2, stable. Low-density subcarinal lymph nodes including a 1.0 cm node on image 89 series 2, formerly the same. Other scattered small mediastinal lymph nodes are present. Lungs/Pleura: Small bilateral pleural effusions although both are increased from 04/12/2021. Centrilobular emphysema. Mild secondary pulmonary lobular septal accentuation of the apices. Bilateral airway thickening. 1.0 by 0.6 cm stable ground-glass density nodule in the apicoposterior segment left upper lobe on image 22 series 3. Ground-glass density peribronchovascular nodule in the left upper lobe on image 33 series 3 is stable at 1.2 by 0.7 cm. Ground-glass density 1.7 by 1.0 cm right lower lobe nodule on image 72 series 3. Increased density of a 0.8 by 0.5 cm right upper lobe sub solid pulmonary nodule on image 62 series 3 compared to the 04/12/2021 exam. Mildly increased atelectasis in the left lower lobe compared to previous. Upper  Abdomen: Small depending gallstones in the gallbladder. Upper abdominal aortic stent with stents in the proximal celiac trunk, SMA, and renal arteries. We partially image a ventral hernia containing adipose tissue on image 185 series 2. Musculoskeletal: Bilateral gynecomastia. 2.0 by 0.6 cm sclerotic lesion in the right posterolateral sixth rib, no change from 12/14/2011, considered benign. IMPRESSION: 1. Small but mildly worsened bilateral pleural effusions with associated passive atelectasis. 2. Airway thickening is present, suggesting bronchitis or reactive airways disease. 3. Several ground-glass density pulmonary nodules are present including two left apical nodule stable from 04/12/2021 (questionable precursors of these lesions on 12/14/2011) and a right upper lobe nodule which has increased in density compared to that time, as well as a stable right lower lobe ground-glass density pulmonary nodule. Low-grade adenocarcinoma is a possibility and surveillance is recommended. 4. Aortic Atherosclerosis (ICD10-I70.0) and Emphysema (ICD10-J43.9). Coronary atherosclerosis. 5. 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 Recommend semi-annual imaging followup by CTA or MRA and referral to cardiothoracic surgery if not already obtained. This recommendation follows 2010 ACCF/AHA/AATS/ACR/ASA/SCA/SCAI/SIR/STS/SVM Guidelines for the Diagnosis and Management of Patients With Thoracic Aortic Disease. Circulation. 2010; 121: T517-O16. Aortic aneurysm NOS (ICD10-I71.9) 6. Coronary and abdominal aortic stents along with stents in the celiac trunk, SMA, and proximal renal arteries. AICD noted. 7. Small anterior pericardial effusion. 8. Cholelithiasis. 9. Partially imaged ventral hernia containing adipose tissue. Electronically Signed   By: Cindra Eves.D.  On: 05/28/2021 08:44   DG Chest Portable 1 View  Result Date: 05/28/2021 CLINICAL DATA:  Shortness of breath, chest pain EXAM: PORTABLE CHEST 1 VIEW COMPARISON:   05/01/2021 FINDINGS: Lungs are clear.  No pleural effusion or pneumothorax. The heart is normal in size.  Left subclavian ICD. Mild degenerative changes of the mid thoracic spine. IMPRESSION: No evidence of acute cardiopulmonary disease. Electronically Signed   By: Julian Hy M.D.   On: 05/28/2021 19:04   DG UGI W SINGLE CM (SOL OR THIN BA)  Result Date: 05/30/2021 CLINICAL DATA:  Upper GI bleed. Evaluate for mucosal ulceration/defect. EXAM: DG UGI W SINGLE CM TECHNIQUE: Scout radiograph was obtained. Single contrast examination was performed using thin liquid barium. This exam was performed by Candiss Norse, PA, and was supervised and interpreted by myself. FLUOROSCOPY TIME:  Radiation Exposure Index (as provided by the fluoroscopic device): 44.4 mGy COMPARISON:  Chest CT 05/27/2021.  Abdominopelvic CT 04/04/2021. FINDINGS: Scout Radiograph: Due to reported contrast allergy, the study was performed with thin barium after obtaining supine and erect views of the abdomen. The bowel gas pattern is nonobstructive. There is no evidence of pneumoperitoneum. Extensive postsurgical changes are noted, related to previous aortoiliac stent grafting. The patient has an AICD. Study was performed in the supine and semi erect positions. Patient was unable to stand or lie prone. The patient swallowed the barium without difficulty. The esophageal motility appears within normal limits. There is no evidence of stricture, mass or ulceration. Mild gastric fold thickening is noted which could indicate gastritis. No evidence of mucosal ulceration. The duodenum appears normal. IMPRESSION: 1. No specific cause for upper GI bleeding identified. There is no evidence of mucosal ulceration. 2. Gastric fold thickening which could indicate gastritis. 3. No significant esophageal abnormality. Electronically Signed   By: Richardean Sale M.D.   On: 05/30/2021 13:12   ECHOCARDIOGRAM COMPLETE  Result Date: 05/29/2021     ECHOCARDIOGRAM REPORT   Patient Name:   Peter [name] Date of Exam: 05/29/2021 Medical Rec #:  [id number]        Height:       70.0 in Accession #:    [id number]       Weight:       204.0 lb Date of Birth:  Aug 14, 1940       BSA:          2.105 m Patient Age:    81 years         BP:           166/66 mmHg Patient Gender: M                HR:           62 bpm. Exam Location:  [facility] Procedure: 2D Echo, Color Doppler, Cardiac Doppler and Intracardiac            Opacification Agent Indications:     R07.9 Chest Pain  History:         Patient has prior history of Echocardiogram examinations, most                  recent 04/04/2021. CHF, CAD, Pacemaker; Risk Factors:HCL.  Sonographer:     Charmayne Sheer Referring Phys:  [place] Diagnosing Phys: Serafina Royals MD  Sonographer Comments: Technically difficult study due to poor echo windows. Image acquisition challenging due to respiratory motion. IMPRESSIONS  1. Left ventricular ejection fraction, by estimation, is 40 to 45%. The left ventricle  has mildly decreased function. The left ventricle demonstrates global hypokinesis. The left ventricular internal cavity size was mildly dilated. Left ventricular diastolic parameters were normal.  2. Right ventricular systolic function is normal. The right ventricular size is normal.  3. Left atrial size was mildly dilated.  4. Right atrial size was mildly dilated.  5. The mitral valve is normal in structure. Mild to moderate mitral valve regurgitation.  6. Tricuspid valve regurgitation is mild to moderate.  7. The aortic valve is normal in structure. Aortic valve regurgitation is not visualized. FINDINGS  Left Ventricle: Left ventricular ejection fraction, by estimation, is 40 to 45%. The left ventricle has mildly decreased function. The left ventricle demonstrates global hypokinesis. Definity contrast agent was given IV to delineate the left ventricular  endocardial borders. The left ventricular internal cavity size was mildly  dilated. There is no left ventricular hypertrophy. Left ventricular diastolic parameters were normal. Right Ventricle: The right ventricular size is normal. No increase in right ventricular wall thickness. Right ventricular systolic function is normal. Left Atrium: Left atrial size was mildly dilated. Right Atrium: Right atrial size was mildly dilated. Pericardium: There is no evidence of pericardial effusion. Mitral Valve: The mitral valve is normal in structure. Mild to moderate mitral valve regurgitation. MV peak gradient, 6.6 mmHg. The mean mitral valve gradient is 4.0 mmHg. Tricuspid Valve: The tricuspid valve is normal in structure. Tricuspid valve regurgitation is mild to moderate. Aortic Valve: The aortic valve is normal in structure. Aortic valve regurgitation is not visualized. Aortic valve mean gradient measures 11.0 mmHg. Aortic valve peak gradient measures 19.0 mmHg. Aortic valve area, by VTI measures 2.27 cm. Pulmonic Valve: The pulmonic valve was normal in structure. Pulmonic valve regurgitation is trivial. Aorta: The aortic root and ascending aorta are structurally normal, with no evidence of dilitation. IAS/Shunts: No atrial level shunt detected by color flow Doppler.  LEFT VENTRICLE PLAX 2D LVIDd:         5.23 cm   Diastology LVIDs:         4.07 cm   LV e' medial:    5.33 cm/s LV PW:         1.39 cm   LV E/e' medial:  18.0 LV IVS:        1.11 cm   LV e' lateral:   5.66 cm/s LVOT diam:     2.50 cm   LV E/e' lateral: 16.9 LV SV:         116 LV SV Index:   55 LVOT Area:     4.91 cm  RIGHT VENTRICLE RV Basal diam:  4.45 cm LEFT ATRIUM           Index        RIGHT ATRIUM           Index LA diam:      3.70 cm 1.76 cm/m   RA Area:     17.10 cm LA Vol (A4C): 73.3 ml 34.82 ml/m  RA Volume:   40.00 ml  19.00 ml/m  AORTIC VALVE                     PULMONIC VALVE AV Area (Vmax):    2.61 cm      PV Vmax:       0.87 m/s AV Area (Vmean):   2.33 cm      PV Vmean:      57.000 cm/s AV Area (VTI):     2.27  cm  PV VTI:        0.157 m AV Vmax:           218.00 cm/s   PV Peak grad:  3.0 mmHg AV Vmean:          160.000 cm/s  PV Mean grad:  2.0 mmHg AV VTI:            0.513 m AV Peak Grad:      19.0 mmHg AV Mean Grad:      11.0 mmHg LVOT Vmax:         116.00 cm/s LVOT Vmean:        75.800 cm/s LVOT VTI:          0.237 m LVOT/AV VTI ratio: 0.46  AORTA Ao Root diam: 3.40 cm MITRAL VALVE                TRICUSPID VALVE MV Area (PHT): 3.44 cm     TR Peak grad:   43.6 mmHg MV Area VTI:   3.32 cm     TR Vmax:        330.00 cm/s MV Peak grad:  6.6 mmHg MV Mean grad:  4.0 mmHg     SHUNTS MV Vmax:       1.28 m/s     Systemic VTI:  0.24 m MV Vmean:      93.5 cm/s    Systemic Diam: 2.50 cm MV Decel Time: 221 msec MV E velocity: 95.75 cm/s MV A velocity: 106.00 cm/s MV E/A ratio:  0.90 Serafina Royals MD Electronically signed by Serafina Royals MD Signature Date/Time: 05/29/2021/12:28:40 PM    Final      Assessment and Recommendation  81 y.o. male with known coronary artery disease hypertension hyperlipidemia chronic obstructive pulmonary disease diabetes and cardiomyopathy status post previous PCI and stent placement with defibrillator pression and CRT-D on appropriate previous medication management with chest pain most consistent with demand ischemia and elevated troponin consistent with demand ischemia non-ST elevation myocardial infarction but no evidence of acute coronary syndrome 1.  Continuation of isosorbide for concerns of chest pain and demand ischemia and coronary artery disease which she has not had since admission 2.  Continuation of carvedilol amlodipine for hypertension control and treatment and will keep medication management the same at this time following for need for other adjustments after ambulation  3.  Careful use of any diuretics due to severe chronic kidney disease exacerbation concerns with no current evidence of congestive heart failure 4.  No further adjustments of medication management appears to  be needed today  5.  No further cardiac diagnostics necessary at this time 6.  Continue ambulation and f if ambulating well today okay for discharge home from cardiac standpoint with follow-up in 1 week  signed, Serafina Royals M.D. FACC

## 2021-06-01 NOTE — Progress Notes (Signed)
PROGRESS NOTE    Peter [name]  EQA:[id number] DOB: September 25, 1940 DOA: 05/28/2021 PCP: Tracie Harrier, MD  [id number]   Assessment & Plan:   Principal Problem:   Chest pain Active Problems:   SOB (shortness of breath)   CAD (coronary artery disease)   HTN (hypertension)   GERD (gastroesophageal reflux disease)   NSTEMI (non-ST elevated myocardial infarction) ([place])   Current tobacco use   S/P ICD (internal cardiac defibrillator) procedure   Anemia    Peter [name] is a 81 y.o.  male with medical history significant for systolic and diastolic heart failure, CAD s/p multiple stents, hypertension, status post AICD who presented with chest pain and dyspnea.     # Chest pain, ACS ruled out # Troponin elevation 2/2 demand ischemia # Hx of CAD s/p stents # Cardiomyopathy s/p AICD --trop up to 1600's.  Per cardio, Dr. Nehemiah Massed, avoided heparin gtt or anti-plt Plan: --no cath planned due to severe CKD --cont isosorbide for chest pain --cont increased coreg  # acute on chronic anemia --Hgb 7.7 on presentation, baseline 10-11 from a month ago.  Pt reported 2 episodes of black stools PTA.  No prior GI workup.  Took plavix at home.  Hematuria occurred after presentation. --s/p 2u pRBC  --GI consulted --UGI study, negative for ulceration. Potentially some gastritis Plan: --monitor Hgb and transfuse with goal >8 --1u pRBC for Hgb 8 today  # Acute urinary retention # BPH --Had prior hx of urinary retention, but current episode occurred after presentation.  Last saw Dr. Diamantina Providence in Nov 2022. --Had 700 ml in bladder scan x2, so Foley inserted. Plan: --cont Foley --cont home finasteride and Flomax --outpatient followup with urology  # Hematuria, not POA --occurred only after I/O cath, in the setting of taking plavix at home. Plan: --cont to hold plavix --intermittent bladder irrigation per RN  HTN --BP elevated --cont home Imdur and amlodipine --cont hydralazine 100  mg TID (increased) --cont increased coreg   DVT prophylaxis: SCD/Compression stockings Code Status: Full code  Family Communication:   Level of care: Progressive Dispo:   The patient is from: home Anticipated d/c is to: home Anticipated d/c date is: 2-3 days Patient currently is not medically ready to d/c due to: need hematuria to resolve and Hgb to stablize   Subjective and Interval History:  Pt reported feeling better today after a shower.  No further BM noted.   Objective: Vitals:   06/01/21 1631 06/01/21 1645 06/01/21 1710 06/01/21 1920  BP: (!) 153/71  (!) 148/66 (!) 151/66  Pulse: 87  68 (!) 56  Resp:  20 18 18   Temp: 98 F (36.7 C)  98 F (36.7 C) 98.1 F (36.7 C)  TempSrc: Oral  Oral Oral  SpO2: 95%  94% 99%  Weight:      Height:        Intake/Output Summary (Last 24 hours) at 06/01/2021 1927 Last data filed at 06/01/2021 1920 Gross per 24 hour  Intake 1290 ml  Output 250 ml  Net 1040 ml   Filed Weights   05/28/21 1836  Weight: 92.5 kg    Examination:   Constitutional: NAD, AAOx3 HEENT: conjunctivae and lids normal, EOMI CV: No cyanosis.   RESP: normal respiratory effort, on RA Extremities: No effusions, edema in BLE SKIN: warm, dry Neuro: II - XII grossly intact.   Psych: Normal mood and affect.  Appropriate judgement and reason  Foley with urine light red   Data Reviewed: I have personally  reviewed following labs and imaging studies  CBC: Recent Labs  Lab 05/28/21 1835 05/29/21 1023 05/30/21 0542 05/31/21 0547 06/01/21 0429  WBC 7.3 6.9 6.6 6.9 5.8  HGB 7.7* 8.4* 7.9* 8.5* 8.0*  HCT 24.1* 26.2* 24.4* 26.5* 25.2*  MCV 92.7 90.0 90.7 90.4 91.6  PLT 130* 130* 128* 121* 258*   Basic Metabolic Panel: Recent Labs  Lab 05/28/21 1835 05/29/21 1023 05/30/21 0542 05/31/21 0547 06/01/21 0429  NA 143 143 143 146* 142  K 3.7 3.4* 3.7 3.6 3.6  CL 113* 112* 112* 111 109  CO2 24 23 24 24 27   GLUCOSE 119* 102* 102* 114* 103*  BUN 75*  75* 73* 68* 72*  CREATININE 3.49* 3.47* 3.63* 3.49* 3.65*  CALCIUM 8.6* 8.6* 8.5* 8.6* 8.2*  MG  --  2.0 1.9 2.0 2.0   GFR: Estimated Creatinine Clearance: 18.1 mL/min (A) (by C-G formula based on SCr of 3.65 mg/dL (H)). Liver Function Tests: Recent Labs  Lab 05/28/21 1835  AST 16  ALT 15  ALKPHOS 75  BILITOT 0.6  PROT 5.2*  ALBUMIN 3.0*   No results for input(s): LIPASE, AMYLASE in the last 168 hours. No results for input(s): AMMONIA in the last 168 hours. Coagulation Profile: Recent Labs  Lab 05/28/21 2109  INR 1.3*   Cardiac Enzymes: No results for input(s): CKTOTAL, CKMB, CKMBINDEX, TROPONINI in the last 168 hours. BNP (last 3 results) No results for input(s): PROBNP in the last 8760 hours. HbA1C: No results for input(s): HGBA1C in the last 72 hours. CBG: No results for input(s): GLUCAP in the last 168 hours. Lipid Profile: No results for input(s): CHOL, HDL, LDLCALC, TRIG, CHOLHDL, LDLDIRECT in the last 72 hours.  Thyroid Function Tests: No results for input(s): TSH, T4TOTAL, FREET4, T3FREE, THYROIDAB in the last 72 hours. Anemia Panel: No results for input(s): VITAMINB12, FOLATE, FERRITIN, TIBC, IRON, RETICCTPCT in the last 72 hours.  Sepsis Labs: No results for input(s): PROCALCITON, LATICACIDVEN in the last 168 hours.  Recent Results (from the past 240 hour(s))  Resp Panel by RT-PCR (Flu A&B, Covid) Nasopharyngeal Swab     Status: None   Collection Time: 05/28/21  7:10 PM   Specimen: Nasopharyngeal Swab; Nasopharyngeal(NP) swabs in vial transport medium  Result Value Ref Range Status   SARS Coronavirus 2 by RT PCR NEGATIVE NEGATIVE Final    Comment: (NOTE) SARS-CoV-2 target nucleic acids are NOT DETECTED.  The SARS-CoV-2 RNA is generally detectable in upper respiratory specimens during the acute phase of infection. The lowest concentration of SARS-CoV-2 viral copies this assay can detect is 138 copies/mL. A negative result does not preclude  SARS-Cov-2 infection and should not be used as the sole basis for treatment or other patient management decisions. A negative result may occur with  improper specimen collection/handling, submission of specimen other than nasopharyngeal swab, presence of viral mutation(s) within the areas targeted by this assay, and inadequate number of viral copies(<138 copies/mL). A negative result must be combined with clinical observations, patient history, and epidemiological information. The expected result is Negative.  Fact Sheet for Patients:  EntrepreneurPulse.com.au  Fact Sheet for Healthcare Providers:  IncredibleEmployment.be  This test is no t yet approved or cleared by the [place] FDA and  has been authorized for detection and/or diagnosis of SARS-CoV-2 by FDA under an Emergency Use Authorization (EUA). This EUA will remain  in effect (meaning this test can be used) for the duration of the COVID-19 declaration under Section 564(b)(1) of the Act, 21 U.S.C.section  360bbb-3(b)(1), unless the authorization is terminated  or revoked sooner.       Influenza A by PCR NEGATIVE NEGATIVE Final   Influenza B by PCR NEGATIVE NEGATIVE Final    Comment: (NOTE) The Xpert Xpress SARS-CoV-2/FLU/RSV plus assay is intended as an aid in the diagnosis of influenza from Nasopharyngeal swab specimens and should not be used as a sole basis for treatment. Nasal washings and aspirates are unacceptable for Xpert Xpress SARS-CoV-2/FLU/RSV testing.  Fact Sheet for Patients: EntrepreneurPulse.com.au  Fact Sheet for Healthcare Providers: IncredibleEmployment.be  This test is not yet approved or cleared by the [place] FDA and has been authorized for detection and/or diagnosis of SARS-CoV-2 by FDA under an Emergency Use Authorization (EUA). This EUA will remain in effect (meaning this test can be used) for the duration of  the COVID-19 declaration under Section 564(b)(1) of the Act, 21 U.S.C. section 360bbb-3(b)(1), unless the authorization is terminated or revoked.  Performed at [facility] [address] [place], [place] [address]       Radiology Studies: No results found.   Scheduled Meds:  amLODipine  10 mg Oral Daily   carvedilol  12.5 mg Oral BID WC   Chlorhexidine Gluconate Cloth  6 each Topical Daily   citalopram  20 mg Oral Daily   finasteride  5 mg Oral Daily   hydrALAZINE  100 mg Oral TID   isosorbide mononitrate  30 mg Oral Daily   pantoprazole  40 mg Oral BID   tamsulosin  0.4 mg Oral Daily   Continuous Infusions:   LOS: 4 days     Enzo Bi, MD [facility] If 7PM-7AM, please contact night-coverage 06/01/2021, 7:27 PM

## 2021-06-02 DIAGNOSIS — R0602 Shortness of breath: Secondary | ICD-10-CM

## 2021-06-02 LAB — TYPE AND SCREEN
ABO/RH(D): O POS
Antibody Screen: NEGATIVE
Unit division: 0

## 2021-06-02 LAB — BPAM RBC
Blood Product Expiration Date: 202303222359
ISSUE DATE / TIME: 202302191645
Unit Type and Rh: 5100

## 2021-06-02 LAB — CBC
HCT: 27.7 % — ABNORMAL LOW (ref 39.0–52.0)
Hemoglobin: 8.8 g/dL — ABNORMAL LOW (ref 13.0–17.0)
MCH: 28.9 pg (ref 26.0–34.0)
MCHC: 31.8 g/dL (ref 30.0–36.0)
MCV: 91.1 fL (ref 80.0–100.0)
Platelets: 128 10*3/uL — ABNORMAL LOW (ref 150–400)
RBC: 3.04 MIL/uL — ABNORMAL LOW (ref 4.22–5.81)
RDW: 14.2 % (ref 11.5–15.5)
WBC: 6 10*3/uL (ref 4.0–10.5)
nRBC: 0 % (ref 0.0–0.2)

## 2021-06-02 LAB — BASIC METABOLIC PANEL
Anion gap: 7 (ref 5–15)
BUN: 69 mg/dL — ABNORMAL HIGH (ref 8–23)
CO2: 26 mmol/L (ref 22–32)
Calcium: 8.3 mg/dL — ABNORMAL LOW (ref 8.9–10.3)
Chloride: 110 mmol/L (ref 98–111)
Creatinine, Ser: 3.7 mg/dL — ABNORMAL HIGH (ref 0.61–1.24)
GFR, Estimated: 16 mL/min — ABNORMAL LOW (ref >60)
Glucose, Bld: 106 mg/dL — ABNORMAL HIGH (ref 70–99)
Potassium: 3.5 mmol/L (ref 3.5–5.1)
Sodium: 143 mmol/L (ref 135–145)

## 2021-06-02 LAB — MAGNESIUM: Magnesium: 2.1 mg/dL (ref 1.7–2.4)

## 2021-06-02 MED ORDER — CARVEDILOL 6.25 MG PO TABS
6.2500 mg | ORAL_TABLET | Freq: Two times a day (BID) | ORAL | Status: DC
  Administered 2021-06-03 (×2): 6.25 mg via ORAL
  Filled 2021-06-02 (×3): qty 1

## 2021-06-02 MED ORDER — CLONIDINE HCL 0.1 MG PO TABS
0.1000 mg | ORAL_TABLET | Freq: Two times a day (BID) | ORAL | Status: DC
  Administered 2021-06-02 – 2021-06-04 (×5): 0.1 mg via ORAL
  Filled 2021-06-02 (×5): qty 1

## 2021-06-02 MED ORDER — IPRATROPIUM-ALBUTEROL 0.5-2.5 (3) MG/3ML IN SOLN
3.0000 mL | Freq: Four times a day (QID) | RESPIRATORY_TRACT | Status: DC
  Administered 2021-06-02 – 2021-06-04 (×6): 3 mL via RESPIRATORY_TRACT
  Filled 2021-06-02 (×6): qty 3

## 2021-06-02 NOTE — Progress Notes (Addendum)
PROGRESS NOTE    Peter [name]  KYH:[id number] DOB: [birth year] DOA: 05/28/2021 PCP: Tracie Harrier, MD  [id number]   Assessment & Plan:   Principal Problem:   Chest pain Active Problems:   SOB (shortness of breath)   CAD (coronary artery disease)   HTN (hypertension)   GERD (gastroesophageal reflux disease)   NSTEMI (non-ST elevated myocardial infarction) ([place])   Current tobacco use   S/P ICD (internal cardiac defibrillator) procedure   Anemia    Peter [name] is a 81 y.o.  male with medical history significant for systolic and diastolic heart failure, CAD s/p multiple stents, hypertension, status post AICD who presented with chest pain and dyspnea.     # Chest pain, ACS ruled out # Troponin elevation 2/2 demand ischemia # Hx of CAD s/p stents # Cardiomyopathy s/p AICD --trop up to 1600's.  Per cardio, Dr. Nehemiah Massed, avoided heparin gtt or anti-plt Plan: --no cath planned due to severe CKD --cont Imdur for chest pain --cont increased coreg  # acute on chronic anemia --Hgb 7.7 on presentation, baseline 10-11 from a month ago.  Pt reported 2 episodes of black stools PTA.  No prior GI workup.  Took plavix at home.  Hematuria occurred after presentation. --s/p 3u pRBC  --GI consulted --UGI study, negative for ulceration. Potentially some gastritis Plan: --monitor Hgb and transfuse with goal >8 --cont to hold plavix  # Acute urinary retention # BPH --Had prior hx of urinary retention, but current episode occurred after presentation.  Last saw Dr. Diamantina Providence in Nov 2022. --Had 700 ml in bladder scan x2, so Foley inserted. Plan: --plan to discharge with Foley --cont home finasteride and Flomax --outpatient followup with urology  # Hematuria, not POA --occurred only after I/O cath, in the setting of taking plavix at home. Plan: --cont to hold plavix --intermittent bladder irrigation per RN  HTN --BP elevated --cont home Imdur and amlodipine --cont  hydralazine 100 mg TID (increased) --cont increased coreg --add clonidine 0.1 mg BID by cardio today --cont to hold home lasix (discussed with cardio)  Chronic dyspnea COPD --per outside record, pt has a presume dx of COPD and extensive smoking hx.  No bronchodilator on home med list.  Chronic dyspnea was mentioned in outpatient cardio visits and pt was in the process of establishing with outpatient pulm (Dr. Lanney Gins) --start DuoNeb QID --need to establish with pulm outpatient   DVT prophylaxis: SCD/Compression stockings Code Status: Full code  Family Communication:   Level of care: Progressive Dispo:   The patient is from: home Anticipated d/c is to: home Anticipated d/c date is: 1-2 days Patient currently is not medically ready to d/c due to: need hematuria to resolve and Hgb to stablize   Subjective and Interval History:  Pt said today that he just wanted to get out of here.  Wearing 2L O2 intermittently due to subjective dyspnea.     Objective: Vitals:   06/01/21 2336 06/02/21 0422 06/02/21 0740 06/02/21 1205  BP: (!) 163/67 (!) 163/68 (!) 184/66 (!) 158/67  Pulse: 67 71 96 (!) 56  Resp: 14 17 18 18   Temp: 97.8 F (36.6 C) 97.6 F (36.4 C) 98.2 F (36.8 C) 98.3 F (36.8 C)  TempSrc:   Oral Oral  SpO2: 98% 98% 98% 98%  Weight:      Height:        Intake/Output Summary (Last 24 hours) at 06/02/2021 1514 Last data filed at 06/02/2021 1300 Gross per 24 hour  Intake 1050  ml  Output 1175 ml  Net -125 ml   Filed Weights   05/28/21 1836  Weight: 92.5 kg    Examination:   Constitutional: NAD, AAOx3 HEENT: conjunctivae and lids normal, EOMI CV: No cyanosis.   RESP: normal respiratory effort, on RA Extremities: No effusions, edema in BLE SKIN: warm, dry Neuro: II - XII grossly intact.   Psych: depressed mood and affect.  Appropriate judgement and reason  Foley with brown urine   Data Reviewed: I have personally reviewed following labs and imaging  studies  CBC: Recent Labs  Lab 05/29/21 1023 05/30/21 0542 05/31/21 0547 06/01/21 0429 06/02/21 0506  WBC 6.9 6.6 6.9 5.8 6.0  HGB 8.4* 7.9* 8.5* 8.0* 8.8*  HCT 26.2* 24.4* 26.5* 25.2* 27.7*  MCV 90.0 90.7 90.4 91.6 91.1  PLT 130* 128* 121* 116* 735*   Basic Metabolic Panel: Recent Labs  Lab 05/29/21 1023 05/30/21 0542 05/31/21 0547 06/01/21 0429 06/02/21 0506  NA 143 143 146* 142 143  K 3.4* 3.7 3.6 3.6 3.5  CL 112* 112* 111 109 110  CO2 23 24 24 27 26   GLUCOSE 102* 102* 114* 103* 106*  BUN 75* 73* 68* 72* 69*  CREATININE 3.47* 3.63* 3.49* 3.65* 3.70*  CALCIUM 8.6* 8.5* 8.6* 8.2* 8.3*  MG 2.0 1.9 2.0 2.0 2.1   GFR: Estimated Creatinine Clearance: 17.9 mL/min (A) (by C-G formula based on SCr of 3.7 mg/dL (H)). Liver Function Tests: Recent Labs  Lab 05/28/21 1835  AST 16  ALT 15  ALKPHOS 75  BILITOT 0.6  PROT 5.2*  ALBUMIN 3.0*   No results for input(s): LIPASE, AMYLASE in the last 168 hours. No results for input(s): AMMONIA in the last 168 hours. Coagulation Profile: Recent Labs  Lab 05/28/21 2109  INR 1.3*   Cardiac Enzymes: No results for input(s): CKTOTAL, CKMB, CKMBINDEX, TROPONINI in the last 168 hours. BNP (last 3 results) No results for input(s): PROBNP in the last 8760 hours. HbA1C: No results for input(s): HGBA1C in the last 72 hours. CBG: No results for input(s): GLUCAP in the last 168 hours. Lipid Profile: No results for input(s): CHOL, HDL, LDLCALC, TRIG, CHOLHDL, LDLDIRECT in the last 72 hours.  Thyroid Function Tests: No results for input(s): TSH, T4TOTAL, FREET4, T3FREE, THYROIDAB in the last 72 hours. Anemia Panel: No results for input(s): VITAMINB12, FOLATE, FERRITIN, TIBC, IRON, RETICCTPCT in the last 72 hours.  Sepsis Labs: No results for input(s): PROCALCITON, LATICACIDVEN in the last 168 hours.  Recent Results (from the past 240 hour(s))  Resp Panel by RT-PCR (Flu A&B, Covid) Nasopharyngeal Swab     Status: None    Collection Time: 05/28/21  7:10 PM   Specimen: Nasopharyngeal Swab; Nasopharyngeal(NP) swabs in vial transport medium  Result Value Ref Range Status   SARS Coronavirus 2 by RT PCR NEGATIVE NEGATIVE Final    Comment: (NOTE) SARS-CoV-2 target nucleic acids are NOT DETECTED.  The SARS-CoV-2 RNA is generally detectable in upper respiratory specimens during the acute phase of infection. The lowest concentration of SARS-CoV-2 viral copies this assay can detect is 138 copies/mL. A negative result does not preclude SARS-Cov-2 infection and should not be used as the sole basis for treatment or other patient management decisions. A negative result may occur with  improper specimen collection/handling, submission of specimen other than nasopharyngeal swab, presence of viral mutation(s) within the areas targeted by this assay, and inadequate number of viral copies(<138 copies/mL). A negative result must be combined with clinical observations, patient history, and  epidemiological information. The expected result is Negative.  Fact Sheet for Patients:  EntrepreneurPulse.com.au  Fact Sheet for Healthcare Providers:  IncredibleEmployment.be  This test is no t yet approved or cleared by the [place] FDA and  has been authorized for detection and/or diagnosis of SARS-CoV-2 by FDA under an Emergency Use Authorization (EUA). This EUA will remain  in effect (meaning this test can be used) for the duration of the COVID-19 declaration under Section 564(b)(1) of the Act, 21 U.S.C.section 360bbb-3(b)(1), unless the authorization is terminated  or revoked sooner.       Influenza A by PCR NEGATIVE NEGATIVE Final   Influenza B by PCR NEGATIVE NEGATIVE Final    Comment: (NOTE) The Xpert Xpress SARS-CoV-2/FLU/RSV plus assay is intended as an aid in the diagnosis of influenza from Nasopharyngeal swab specimens and should not be used as a sole basis for treatment.  Nasal washings and aspirates are unacceptable for Xpert Xpress SARS-CoV-2/FLU/RSV testing.  Fact Sheet for Patients: EntrepreneurPulse.com.au  Fact Sheet for Healthcare Providers: IncredibleEmployment.be  This test is not yet approved or cleared by the [place] FDA and has been authorized for detection and/or diagnosis of SARS-CoV-2 by FDA under an Emergency Use Authorization (EUA). This EUA will remain in effect (meaning this test can be used) for the duration of the COVID-19 declaration under Section 564(b)(1) of the Act, 21 U.S.C. section 360bbb-3(b)(1), unless the authorization is terminated or revoked.  Performed at [facility], [address] [place], [place] [address]       Radiology Studies: No results found.   Scheduled Meds:  amLODipine  10 mg Oral Daily   carvedilol  12.5 mg Oral BID WC   Chlorhexidine Gluconate Cloth  6 each Topical Daily   citalopram  20 mg Oral Daily   cloNIDine  0.1 mg Oral BID   finasteride  5 mg Oral Daily   hydrALAZINE  100 mg Oral TID   isosorbide mononitrate  30 mg Oral Daily   pantoprazole  40 mg Oral BID   tamsulosin  0.4 mg Oral Daily   Continuous Infusions:   LOS: 5 days     Enzo Bi, MD [facility] If 7PM-7AM, please contact night-coverage 06/02/2021, 3:14 PM

## 2021-06-02 NOTE — Progress Notes (Signed)
[facility] Encounter Note  Patient: Peter [name] / Admit Date: 05/28/2021 / Date of Encounter: 06/02/2021, 12:41 PM   Subjective: Patient is overall doing relatively well since yesterday from the cardiovascular standpoint.  There is been no evidence of further chest discomfort.  Troponin level peaked at 1696 possibly consistent with demand ischemia from significant anemia with hemoglobin down in the 7.7 range as well as a glomerular filtration rate of 17.  No evidence of overt changes by EKG although EKG has been abnormal before with left bundle branch block with previous congestive heart failure and defibrillator placement with CRT  Echocardiogram showing mild LV systolic dysfunction unchanged from before.  The patient did have significant amount of hematuria but no evidence of GI bleed as a source of his significant anemia.  Plavix aspirin heparin have all been held with continued issues of hematuria but appears to be somewhat improved today.  Current blood pressure has been slightly elevated but reasonable before on appropriate medication management.  There has been improvements with packed red blood cells with a hemoglobin of above 8  Patient continues to have significant hypertension at this time despite maximal medication management.  Unable to use ACE inhibitors or Aldactone due to Review of Systems: Positive for: Shortness of breath Negative for: Vision change, hearing change, syncope, dizziness, nausea, vomiting,diarrhea, bloody stool, stomach pain, cough, congestion, diaphoresis, urinary frequency, urinary pain,skin lesions, skin rashes Others previously listed  Objective: Telemetry: Normal sinus rhythm with bundle branch block Physical Exam: Blood pressure (!) 158/67, pulse (!) 56, temperature 98.3 F (36.8 C), temperature source Oral, resp. rate 18, height 5\' 10"  (1.778 m), weight 92.5 kg, SpO2 98 %. Body mass index is 29.27 kg/m. General: Well developed,  well nourished, in no acute distress. Head: Normocephalic, atraumatic, sclera non-icteric, no xanthomas, nares are without discharge. Neck: No apparent masses Lungs: Normal respirations with no wheezes, few rhonchi, no rales , no crackles   Heart: Regular rate and rhythm, normal S1 S2, no murmur, no rub, no gallop, PMI is normal size and placement, carotid upstroke normal without bruit, jugular venous pressure normal Abdomen: Soft, non-tender, non-distended with normoactive bowel sounds. No hepatosplenomegaly. Abdominal aorta is normal size without bruit Extremities: No edema, no clubbing, no cyanosis, no ulcers,  Peripheral: 2+ radial, 2+ femoral, 2+ dorsal pedal pulses Neuro: Alert and oriented. Moves all extremities spontaneously. Psych:  Responds to questions appropriately with a normal affect.   Intake/Output Summary (Last 24 hours) at 06/02/2021 1241 Last data filed at 06/02/2021 1232 Gross per 24 hour  Intake 810 ml  Output 1175 ml  Net -365 ml     Inpatient Medications:   amLODipine  10 mg Oral Daily   carvedilol  12.5 mg Oral BID WC   Chlorhexidine Gluconate Cloth  6 each Topical Daily   citalopram  20 mg Oral Daily   finasteride  5 mg Oral Daily   hydrALAZINE  100 mg Oral TID   isosorbide mononitrate  30 mg Oral Daily   pantoprazole  40 mg Oral BID   tamsulosin  0.4 mg Oral Daily   Infusions:   Labs: Recent Labs    06/01/21 0429 06/02/21 0506  NA 142 143  K 3.6 3.5  CL 109 110  CO2 27 26  GLUCOSE 103* 106*  BUN 72* 69*  CREATININE 3.65* 3.70*  CALCIUM 8.2* 8.3*  MG 2.0 2.1    No results for input(s): AST, ALT, ALKPHOS, BILITOT, PROT, ALBUMIN in the last 72  hours.  Recent Labs    06/01/21 0429 06/02/21 0506  WBC 5.8 6.0  HGB 8.0* 8.8*  HCT 25.2* 27.7*  MCV 91.6 91.1  PLT 116* 128*    No results for input(s): CKTOTAL, CKMB, TROPONINI in the last 72 hours. Invalid input(s): POCBNP No results for input(s): HGBA1C in the last 72 hours.    Weights: Filed Weights   05/28/21 1836  Weight: 92.5 kg     Radiology/Studies:  CT CHEST WO CONTRAST  Result Date: 05/28/2021 CLINICAL DATA:  Shortness of breath. History of congestive heart failure. Substantial smoking history. EXAM: CT CHEST WITHOUT CONTRAST TECHNIQUE: Multidetector CT imaging of the chest was performed following the standard protocol without IV contrast. RADIATION DOSE REDUCTION: This exam was performed according to the departmental dose-optimization program which includes automated exposure control, adjustment of the mA and/or kV according to patient size and/or use of iterative reconstruction technique. COMPARISON:  Chest radiograph 05/01/2021 and chest CT 04/12/2021 FINDINGS: Cardiovascular: Coronary, aortic arch, and branch vessel atherosclerotic vascular disease. Coronary stents. AICD noted. 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 Small anterior pericardial effusion. Borderline cardiomegaly. Descending thoracic aortic and upper abdominal aortic stent. Mediastinum/Nodes: AP window lymph node 0.8 cm in short axis on image 62 series 2, stable. Low-density subcarinal lymph nodes including a 1.0 cm node on image 89 series 2, formerly the same. Other scattered small mediastinal lymph nodes are present. Lungs/Pleura: Small bilateral pleural effusions although both are increased from 04/12/2021. Centrilobular emphysema. Mild secondary pulmonary lobular septal accentuation of the apices. Bilateral airway thickening. 1.0 by 0.6 cm stable ground-glass density nodule in the apicoposterior segment left upper lobe on image 22 series 3. Ground-glass density peribronchovascular nodule in the left upper lobe on image 33 series 3 is stable at 1.2 by 0.7 cm. Ground-glass density 1.7 by 1.0 cm right lower lobe nodule on image 72 series 3. Increased density of a 0.8 by 0.5 cm right upper lobe sub solid pulmonary nodule on image 62  series 3 compared to the 04/12/2021 exam. Mildly increased atelectasis in the left lower lobe compared to previous. Upper Abdomen: Small depending gallstones in the gallbladder. Upper abdominal aortic stent with stents in the proximal celiac trunk, SMA, and renal arteries. We partially image a ventral hernia containing adipose tissue on image 185 series 2. Musculoskeletal: Bilateral gynecomastia. 2.0 by 0.6 cm sclerotic lesion in the right posterolateral sixth rib, no change from 12/14/2011, considered benign. IMPRESSION: 1. Small but mildly worsened bilateral pleural effusions with associated passive atelectasis. 2. Airway thickening is present, suggesting bronchitis or reactive airways disease. 3. Several ground-glass density pulmonary nodules are present including two left apical nodule stable from 04/12/2021 (questionable precursors of these lesions on 12/14/2011) and a right upper lobe nodule which has increased in density compared to that time, as well as a stable right lower lobe ground-glass density pulmonary nodule. Low-grade adenocarcinoma is a possibility and surveillance is recommended. 4. Aortic Atherosclerosis (ICD10-I70.0) and Emphysema (ICD10-J43.9). Coronary atherosclerosis. 5. 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 Recommend semi-annual imaging followup by CTA or MRA and referral to cardiothoracic surgery if not already obtained. This recommendation follows 2010 ACCF/AHA/AATS/ACR/ASA/SCA/SCAI/SIR/STS/SVM Guidelines for the Diagnosis and Management of Patients With Thoracic Aortic Disease. Circulation. 2010; 121: M767-M09. Aortic aneurysm NOS (ICD10-I71.9) 6. Coronary and abdominal aortic stents along with stents in the celiac trunk, SMA, and proximal renal arteries. AICD noted. 7. Small anterior pericardial effusion. 8. Cholelithiasis. 9.  Partially imaged ventral hernia containing adipose tissue. Electronically Signed   By: Van Clines M.D.   On: 05/28/2021 08:44   DG Chest Portable 1  View  Result Date: 05/28/2021 CLINICAL DATA:  Shortness of breath, chest pain EXAM: PORTABLE CHEST 1 VIEW COMPARISON:  05/01/2021 FINDINGS: Lungs are clear.  No pleural effusion or pneumothorax. The heart is normal in size.  Left subclavian ICD. Mild degenerative changes of the mid thoracic spine. IMPRESSION: No evidence of acute cardiopulmonary disease. Electronically Signed   By: Julian Hy M.D.   On: 05/28/2021 19:04   DG UGI W SINGLE CM (SOL OR THIN BA)  Result Date: 05/30/2021 CLINICAL DATA:  Upper GI bleed. Evaluate for mucosal ulceration/defect. EXAM: DG UGI W SINGLE CM TECHNIQUE: Scout radiograph was obtained. Single contrast examination was performed using thin liquid barium. This exam was performed by Candiss Norse, PA, and was supervised and interpreted by myself. FLUOROSCOPY TIME:  Radiation Exposure Index (as provided by the fluoroscopic device): 44.4 mGy COMPARISON:  Chest CT 05/27/2021.  Abdominopelvic CT 04/04/2021. FINDINGS: Scout Radiograph: Due to reported contrast allergy, the study was performed with thin barium after obtaining supine and erect views of the abdomen. The bowel gas pattern is nonobstructive. There is no evidence of pneumoperitoneum. Extensive postsurgical changes are noted, related to previous aortoiliac stent grafting. The patient has an AICD. Study was performed in the supine and semi erect positions. Patient was unable to stand or lie prone. The patient swallowed the barium without difficulty. The esophageal motility appears within normal limits. There is no evidence of stricture, mass or ulceration. Mild gastric fold thickening is noted which could indicate gastritis. No evidence of mucosal ulceration. The duodenum appears normal. IMPRESSION: 1. No specific cause for upper GI bleeding identified. There is no evidence of mucosal ulceration. 2. Gastric fold thickening which could indicate gastritis. 3. No significant esophageal abnormality. Electronically Signed    By: Richardean Sale M.D.   On: 05/30/2021 13:12   ECHOCARDIOGRAM COMPLETE  Result Date: 05/29/2021    ECHOCARDIOGRAM REPORT   Patient Name:   Peter [name] Date of Exam: 05/29/2021 Medical Rec #:  [id number]        Height:       70.0 in Accession #:    [id number]       Weight:       204.0 lb Date of Birth:  [birth year]       BSA:          2.105 m Patient Age:    19 years         BP:           166/66 mmHg Patient Gender: M                HR:           62 bpm. Exam Location:  [facility] Procedure: 2D Echo, Color Doppler, Cardiac Doppler and Intracardiac            Opacification Agent Indications:     R07.9 Chest Pain  History:         Patient has prior history of Echocardiogram examinations, most                  recent 04/04/2021. CHF, CAD, Pacemaker; Risk Factors:HCL.  Sonographer:     Charmayne Sheer Referring Phys:  [place] Diagnosing Phys: Serafina Royals MD  Sonographer Comments: Technically difficult study due to poor echo windows. Image acquisition challenging due to  respiratory motion. IMPRESSIONS  1. Left ventricular ejection fraction, by estimation, is 40 to 45%. The left ventricle has mildly decreased function. The left ventricle demonstrates global hypokinesis. The left ventricular internal cavity size was mildly dilated. Left ventricular diastolic parameters were normal.  2. Right ventricular systolic function is normal. The right ventricular size is normal.  3. Left atrial size was mildly dilated.  4. Right atrial size was mildly dilated.  5. The mitral valve is normal in structure. Mild to moderate mitral valve regurgitation.  6. Tricuspid valve regurgitation is mild to moderate.  7. The aortic valve is normal in structure. Aortic valve regurgitation is not visualized. FINDINGS  Left Ventricle: Left ventricular ejection fraction, by estimation, is 40 to 45%. The left ventricle has mildly decreased function. The left ventricle demonstrates global hypokinesis. Definity contrast agent was given IV  to delineate the left ventricular  endocardial borders. The left ventricular internal cavity size was mildly dilated. There is no left ventricular hypertrophy. Left ventricular diastolic parameters were normal. Right Ventricle: The right ventricular size is normal. No increase in right ventricular wall thickness. Right ventricular systolic function is normal. Left Atrium: Left atrial size was mildly dilated. Right Atrium: Right atrial size was mildly dilated. Pericardium: There is no evidence of pericardial effusion. Mitral Valve: The mitral valve is normal in structure. Mild to moderate mitral valve regurgitation. MV peak gradient, 6.6 mmHg. The mean mitral valve gradient is 4.0 mmHg. Tricuspid Valve: The tricuspid valve is normal in structure. Tricuspid valve regurgitation is mild to moderate. Aortic Valve: The aortic valve is normal in structure. Aortic valve regurgitation is not visualized. Aortic valve mean gradient measures 11.0 mmHg. Aortic valve peak gradient measures 19.0 mmHg. Aortic valve area, by VTI measures 2.27 cm. Pulmonic Valve: The pulmonic valve was normal in structure. Pulmonic valve regurgitation is trivial. Aorta: The aortic root and ascending aorta are structurally normal, with no evidence of dilitation. IAS/Shunts: No atrial level shunt detected by color flow Doppler.  LEFT VENTRICLE PLAX 2D LVIDd:         5.23 cm   Diastology LVIDs:         4.07 cm   LV e' medial:    5.33 cm/s LV PW:         1.39 cm   LV E/e' medial:  18.0 LV IVS:        1.11 cm   LV e' lateral:   5.66 cm/s LVOT diam:     2.50 cm   LV E/e' lateral: 16.9 LV SV:         116 LV SV Index:   55 LVOT Area:     4.91 cm  RIGHT VENTRICLE RV Basal diam:  4.45 cm LEFT ATRIUM           Index        RIGHT ATRIUM           Index LA diam:      3.70 cm 1.76 cm/m   RA Area:     17.10 cm LA Vol (A4C): 73.3 ml 34.82 ml/m  RA Volume:   40.00 ml  19.00 ml/m  AORTIC VALVE                     PULMONIC VALVE AV Area (Vmax):    2.61 cm       PV Vmax:       0.87 m/s AV Area (Vmean):   2.33 cm      PV  Vmean:      57.000 cm/s AV Area (VTI):     2.27 cm      PV VTI:        0.157 m AV Vmax:           218.00 cm/s   PV Peak grad:  3.0 mmHg AV Vmean:          160.000 cm/s  PV Mean grad:  2.0 mmHg AV VTI:            0.513 m AV Peak Grad:      19.0 mmHg AV Mean Grad:      11.0 mmHg LVOT Vmax:         116.00 cm/s LVOT Vmean:        75.800 cm/s LVOT VTI:          0.237 m LVOT/AV VTI ratio: 0.46  AORTA Ao Root diam: 3.40 cm MITRAL VALVE                TRICUSPID VALVE MV Area (PHT): 3.44 cm     TR Peak grad:   43.6 mmHg MV Area VTI:   3.32 cm     TR Vmax:        330.00 cm/s MV Peak grad:  6.6 mmHg MV Mean grad:  4.0 mmHg     SHUNTS MV Vmax:       1.28 m/s     Systemic VTI:  0.24 m MV Vmean:      93.5 cm/s    Systemic Diam: 2.50 cm MV Decel Time: 221 msec MV E velocity: 95.75 cm/s MV A velocity: 106.00 cm/s MV E/A ratio:  0.90 Serafina Royals MD Electronically signed by Serafina Royals MD Signature Date/Time: 05/29/2021/12:28:40 PM    Final      Assessment and Recommendation  81 y.o. male with known coronary artery disease hypertension hyperlipidemia chronic obstructive pulmonary disease diabetes and cardiomyopathy status post previous PCI and stent placement with defibrillator pression and CRT-D on appropriate previous medication management with chest pain most consistent with demand ischemia and elevated troponin consistent with demand ischemia non-ST elevation myocardial infarction but no evidence of acute coronary syndrome 1.  Continuation of isosorbide for concerns of chest pain and demand ischemia and coronary artery disease which she has not had since admission 2.  Continuation of carvedilol amlodipine and hydralazine for hypertension control a and would consider the possibility of clonidine 0.1 mg twice per day for about above blood pressure control if necessary due to the fact that the patient cannot use angiotensin receptor blocker, ACE inhibitor,  and/or Entresto.   3.  Careful use of any diuretics due to severe chronic kidney disease exacerbation concerns with no current evidence of congestive heart failure 4.  No further adjustments of medication management appears to be needed today  5.  No further cardiac diagnostics necessary at this time 6.  Continue ambulation and f if ambulating well today okay for discharge home from cardiac standpoint with follow-up in 1 week  signed, Serafina Royals M.D. FACC

## 2021-06-02 NOTE — Care Management Important Message (Signed)
Important Message  Patient Details  Name: Peter [name] MRN: [id number] Date of Birth: 11-05-1940   Medicare Important Message Given:  Yes     [name] 06/02/2021, 11:10 AM

## 2021-06-03 LAB — BASIC METABOLIC PANEL
Anion gap: 10 (ref 5–15)
BUN: 66 mg/dL — ABNORMAL HIGH (ref 8–23)
CO2: 25 mmol/L (ref 22–32)
Calcium: 8.3 mg/dL — ABNORMAL LOW (ref 8.9–10.3)
Chloride: 109 mmol/L (ref 98–111)
Creatinine, Ser: 3.58 mg/dL — ABNORMAL HIGH (ref 0.61–1.24)
GFR, Estimated: 16 mL/min — ABNORMAL LOW (ref >60)
Glucose, Bld: 112 mg/dL — ABNORMAL HIGH (ref 70–99)
Potassium: 3.5 mmol/L (ref 3.5–5.1)
Sodium: 144 mmol/L (ref 135–145)

## 2021-06-03 LAB — CBC
HCT: 28.2 % — ABNORMAL LOW (ref 39.0–52.0)
Hemoglobin: 9 g/dL — ABNORMAL LOW (ref 13.0–17.0)
MCH: 29.4 pg (ref 26.0–34.0)
MCHC: 31.9 g/dL (ref 30.0–36.0)
MCV: 92.2 fL (ref 80.0–100.0)
Platelets: 124 10*3/uL — ABNORMAL LOW (ref 150–400)
RBC: 3.06 MIL/uL — ABNORMAL LOW (ref 4.22–5.81)
RDW: 13.8 % (ref 11.5–15.5)
WBC: 5.6 10*3/uL (ref 4.0–10.5)
nRBC: 0 % (ref 0.0–0.2)

## 2021-06-03 LAB — MAGNESIUM: Magnesium: 2 mg/dL (ref 1.7–2.4)

## 2021-06-03 MED ORDER — CLONIDINE HCL 0.1 MG PO TABS: 0.1000 mg | ORAL_TABLET | Freq: Two times a day (BID) | ORAL | 2 refills | Status: DC

## 2021-06-03 MED ORDER — UMECLIDINIUM BROMIDE 62.5 MCG/ACT IN AEPB
1.0000 | INHALATION_SPRAY | Freq: Every day | RESPIRATORY_TRACT | Status: DC
  Administered 2021-06-03 – 2021-06-04 (×2): 1 via RESPIRATORY_TRACT
  Filled 2021-06-03: qty 7

## 2021-06-03 MED ORDER — CLOPIDOGREL BISULFATE 75 MG PO TABS: ORAL_TABLET | ORAL | 1 refills | Status: DC

## 2021-06-03 MED ORDER — HYDRALAZINE HCL 100 MG PO TABS: 100.0000 mg | ORAL_TABLET | Freq: Three times a day (TID) | ORAL | 2 refills | Status: DC

## 2021-06-03 MED ORDER — CARVEDILOL 6.25 MG PO TABS: 6.2500 mg | ORAL_TABLET | Freq: Two times a day (BID) | ORAL | 2 refills | Status: DC

## 2021-06-03 MED ORDER — UMECLIDINIUM BROMIDE 62.5 MCG/ACT IN AEPB: 1.0000 | INHALATION_SPRAY | Freq: Every day | RESPIRATORY_TRACT | 2 refills | Status: DC

## 2021-06-03 MED ORDER — SPIRONOLACTONE 25 MG PO TABS: ORAL_TABLET | ORAL | Status: DC

## 2021-06-03 MED ORDER — TAMSULOSIN HCL 0.4 MG PO CAPS: 0.4000 mg | ORAL_CAPSULE | Freq: Every day | ORAL | 2 refills | Status: DC

## 2021-06-03 MED ORDER — ISOSORBIDE MONONITRATE ER 30 MG PO TB24: 30.0000 mg | ORAL_TABLET | Freq: Every day | ORAL | 2 refills | Status: DC

## 2021-06-03 MED ORDER — AMLODIPINE BESYLATE 10 MG PO TABS: 10.0000 mg | ORAL_TABLET | Freq: Every day | ORAL | 2 refills | Status: DC

## 2021-06-03 MED ORDER — FUROSEMIDE 40 MG PO TABS
ORAL_TABLET | ORAL | Status: DC
Start: 2021-06-03 — End: 2021-06-24

## 2021-06-03 MED ORDER — FINASTERIDE 5 MG PO TABS: 5.0000 mg | ORAL_TABLET | Freq: Every day | ORAL | 2 refills | Status: DC

## 2021-06-03 NOTE — Progress Notes (Signed)
[facility] Encounter Note  Patient: Peter [name] / Admit Date: 05/28/2021 / Date of Encounter: 06/03/2021, 2:04 PM   Subjective: Patient is overall doing relatively well since yesterday from the cardiovascular standpoint.  There is been no evidence of further chest discomfort.  Troponin level peaked at 1696 possibly consistent with demand ischemia from significant anemia with hemoglobin down in the 7.7 range as well as a glomerular filtration rate of 17.  No evidence of overt changes by EKG although EKG has been abnormal before with left bundle branch block with previous congestive heart failure and defibrillator placement with CRT  Echocardiogram showing mild LV systolic dysfunction unchanged from before.  The patient did have significant amount of hematuria but no evidence of GI bleed as a source of his significant anemia.  Plavix aspirin heparin have all been held with continued issues of hematuria but appears to be somewhat improved today.  Current blood pressure has been slightly elevated but reasonable before on appropriate medication management.  There has been improvements with packed red blood cells with a hemoglobin of above 8  Patient continues to have significant hypertension at this time despite maximal medication management.  This includes the addition clonidine 0.1 mg twice per day yesterday.  Blood pressure has not significantly changed at that time.   Unable to use ACE inhibitors or Aldactone due to Review of Systems: Positive for: Shortness of breath Negative for: Vision change, hearing change, syncope, dizziness, nausea, vomiting,diarrhea, bloody stool, stomach pain, cough, congestion, diaphoresis, urinary frequency, urinary pain,skin lesions, skin rashes Others previously listed  Objective: Telemetry: Normal sinus rhythm with bundle branch block Physical Exam: Blood pressure (!) 150/60, pulse 92, temperature 97.8 F (36.6 C), temperature source  Oral, resp. rate 10, height 5\' 10"  (1.778 m), weight 92.5 kg, SpO2 95 %. Body mass index is 29.27 kg/m. General: Well developed, well nourished, in no acute distress. Head: Normocephalic, atraumatic, sclera non-icteric, no xanthomas, nares are without discharge. Neck: No apparent masses Lungs: Normal respirations with no wheezes, few rhonchi, no rales , no crackles   Heart: Regular rate and rhythm, normal S1 S2, no murmur, no rub, no gallop, PMI is normal size and placement, carotid upstroke normal without bruit, jugular venous pressure normal Abdomen: Soft, non-tender, non-distended with normoactive bowel sounds. No hepatosplenomegaly. Abdominal aorta is normal size without bruit Extremities: No edema, no clubbing, no cyanosis, no ulcers,  Peripheral: 2+ radial, 2+ femoral, 2+ dorsal pedal pulses Neuro: Alert and oriented. Moves all extremities spontaneously. Psych:  Responds to questions appropriately with a normal affect.   Intake/Output Summary (Last 24 hours) at 06/03/2021 1404 Last data filed at 06/03/2021 1352 Gross per 24 hour  Intake 180 ml  Output 775 ml  Net -595 ml     Inpatient Medications:   amLODipine  10 mg Oral Daily   carvedilol  6.25 mg Oral BID WC   Chlorhexidine Gluconate Cloth  6 each Topical Daily   citalopram  20 mg Oral Daily   cloNIDine  0.1 mg Oral BID   finasteride  5 mg Oral Daily   hydrALAZINE  100 mg Oral TID   ipratropium-albuterol  3 mL Nebulization QID   isosorbide mononitrate  30 mg Oral Daily   pantoprazole  40 mg Oral BID   tamsulosin  0.4 mg Oral Daily   umeclidinium bromide  1 puff Inhalation Daily   Infusions:   Labs: Recent Labs    06/02/21 0506 06/03/21 0624  NA 143 144  K  3.5 3.5  CL 110 109  CO2 26 25  GLUCOSE 106* 112*  BUN 69* 66*  CREATININE 3.70* 3.58*  CALCIUM 8.3* 8.3*  MG 2.1 2.0    No results for input(s): AST, ALT, ALKPHOS, BILITOT, PROT, ALBUMIN in the last 72 hours.  Recent Labs    06/02/21 0506  06/03/21 0624  WBC 6.0 5.6  HGB 8.8* 9.0*  HCT 27.7* 28.2*  MCV 91.1 92.2  PLT 128* 124*    No results for input(s): CKTOTAL, CKMB, TROPONINI in the last 72 hours. Invalid input(s): POCBNP No results for input(s): HGBA1C in the last 72 hours.   Weights: Filed Weights   05/28/21 1836  Weight: 92.5 kg     Radiology/Studies:  CT CHEST WO CONTRAST  Result Date: 05/28/2021 CLINICAL DATA:  Shortness of breath. History of congestive heart failure. Substantial smoking history. EXAM: CT CHEST WITHOUT CONTRAST TECHNIQUE: Multidetector CT imaging of the chest was performed following the standard protocol without IV contrast. RADIATION DOSE REDUCTION: This exam was performed according to the departmental dose-optimization program which includes automated exposure control, adjustment of the mA and/or kV according to patient size and/or use of iterative reconstruction technique. COMPARISON:  Chest radiograph 05/01/2021 and chest CT 04/12/2021 FINDINGS: Cardiovascular: Coronary, aortic arch, and branch vessel atherosclerotic vascular disease. Coronary stents. AICD noted. 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 Small anterior pericardial effusion. Borderline cardiomegaly. Descending thoracic aortic and upper abdominal aortic stent. Mediastinum/Nodes: AP window lymph node 0.8 cm in short axis on image 62 series 2, stable. Low-density subcarinal lymph nodes including a 1.0 cm node on image 89 series 2, formerly the same. Other scattered small mediastinal lymph nodes are present. Lungs/Pleura: Small bilateral pleural effusions although both are increased from 04/12/2021. Centrilobular emphysema. Mild secondary pulmonary lobular septal accentuation of the apices. Bilateral airway thickening. 1.0 by 0.6 cm stable ground-glass density nodule in the apicoposterior segment left upper lobe on image 22 series 3. Ground-glass density peribronchovascular  nodule in the left upper lobe on image 33 series 3 is stable at 1.2 by 0.7 cm. Ground-glass density 1.7 by 1.0 cm right lower lobe nodule on image 72 series 3. Increased density of a 0.8 by 0.5 cm right upper lobe sub solid pulmonary nodule on image 62 series 3 compared to the 04/12/2021 exam. Mildly increased atelectasis in the left lower lobe compared to previous. Upper Abdomen: Small depending gallstones in the gallbladder. Upper abdominal aortic stent with stents in the proximal celiac trunk, SMA, and renal arteries. We partially image a ventral hernia containing adipose tissue on image 185 series 2. Musculoskeletal: Bilateral gynecomastia. 2.0 by 0.6 cm sclerotic lesion in the right posterolateral sixth rib, no change from 12/14/2011, considered benign. IMPRESSION: 1. Small but mildly worsened bilateral pleural effusions with associated passive atelectasis. 2. Airway thickening is present, suggesting bronchitis or reactive airways disease. 3. Several ground-glass density pulmonary nodules are present including two left apical nodule stable from 04/12/2021 (questionable precursors of these lesions on 12/14/2011) and a right upper lobe nodule which has increased in density compared to that time, as well as a stable right lower lobe ground-glass density pulmonary nodule. Low-grade adenocarcinoma is a possibility and surveillance is recommended. 4. Aortic Atherosclerosis (ICD10-I70.0) and Emphysema (ICD10-J43.9). Coronary atherosclerosis. 5. 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 Recommend semi-annual imaging followup by CTA or MRA and referral to cardiothoracic surgery if not already obtained. This recommendation follows 2010 ACCF/AHA/AATS/ACR/ASA/SCA/SCAI/SIR/STS/SVM Guidelines  for the Diagnosis and Management of Patients With Thoracic Aortic Disease. Circulation. 2010; 121: H702-O37. Aortic aneurysm NOS (ICD10-I71.9) 6. Coronary and abdominal aortic stents along with stents in the celiac trunk, SMA, and proximal  renal arteries. AICD noted. 7. Small anterior pericardial effusion. 8. Cholelithiasis. 9. Partially imaged ventral hernia containing adipose tissue. Electronically Signed   By: Van Clines M.D.   On: 05/28/2021 08:44   DG Chest Portable 1 View  Result Date: 05/28/2021 CLINICAL DATA:  Shortness of breath, chest pain EXAM: PORTABLE CHEST 1 VIEW COMPARISON:  05/01/2021 FINDINGS: Lungs are clear.  No pleural effusion or pneumothorax. The heart is normal in size.  Left subclavian ICD. Mild degenerative changes of the mid thoracic spine. IMPRESSION: No evidence of acute cardiopulmonary disease. Electronically Signed   By: Julian Hy M.D.   On: 05/28/2021 19:04   DG UGI W SINGLE CM (SOL OR THIN BA)  Result Date: 05/30/2021 CLINICAL DATA:  Upper GI bleed. Evaluate for mucosal ulceration/defect. EXAM: DG UGI W SINGLE CM TECHNIQUE: Scout radiograph was obtained. Single contrast examination was performed using thin liquid barium. This exam was performed by Candiss Norse, PA, and was supervised and interpreted by myself. FLUOROSCOPY TIME:  Radiation Exposure Index (as provided by the fluoroscopic device): 44.4 mGy COMPARISON:  Chest CT 05/27/2021.  Abdominopelvic CT 04/04/2021. FINDINGS: Scout Radiograph: Due to reported contrast allergy, the study was performed with thin barium after obtaining supine and erect views of the abdomen. The bowel gas pattern is nonobstructive. There is no evidence of pneumoperitoneum. Extensive postsurgical changes are noted, related to previous aortoiliac stent grafting. The patient has an AICD. Study was performed in the supine and semi erect positions. Patient was unable to stand or lie prone. The patient swallowed the barium without difficulty. The esophageal motility appears within normal limits. There is no evidence of stricture, mass or ulceration. Mild gastric fold thickening is noted which could indicate gastritis. No evidence of mucosal ulceration. The duodenum  appears normal. IMPRESSION: 1. No specific cause for upper GI bleeding identified. There is no evidence of mucosal ulceration. 2. Gastric fold thickening which could indicate gastritis. 3. No significant esophageal abnormality. Electronically Signed   By: Richardean Sale M.D.   On: 05/30/2021 13:12   ECHOCARDIOGRAM COMPLETE  Result Date: 05/29/2021    ECHOCARDIOGRAM REPORT   Patient Name:   RAFFERTY POSTLEWAIT Date of Exam: 05/29/2021 Medical Rec #:  [id number]        Height:       70.0 in Accession #:    [id number]       Weight:       204.0 lb Date of Birth:  11-28-1940       BSA:          2.105 m Patient Age:    46 years         BP:           166/66 mmHg Patient Gender: M                HR:           62 bpm. Exam Location:  [facility] Procedure: 2D Echo, Color Doppler, Cardiac Doppler and Intracardiac            Opacification Agent Indications:     R07.9 Chest Pain  History:         Patient has prior history of Echocardiogram examinations, most  recent 04/04/2021. CHF, CAD, Pacemaker; Risk Factors:HCL.  Sonographer:     Charmayne Sheer Referring Phys:  [place] Diagnosing Phys: Serafina Royals MD  Sonographer Comments: Technically difficult study due to poor echo windows. Image acquisition challenging due to respiratory motion. IMPRESSIONS  1. Left ventricular ejection fraction, by estimation, is 40 to 45%. The left ventricle has mildly decreased function. The left ventricle demonstrates global hypokinesis. The left ventricular internal cavity size was mildly dilated. Left ventricular diastolic parameters were normal.  2. Right ventricular systolic function is normal. The right ventricular size is normal.  3. Left atrial size was mildly dilated.  4. Right atrial size was mildly dilated.  5. The mitral valve is normal in structure. Mild to moderate mitral valve regurgitation.  6. Tricuspid valve regurgitation is mild to moderate.  7. The aortic valve is normal in structure. Aortic valve regurgitation  is not visualized. FINDINGS  Left Ventricle: Left ventricular ejection fraction, by estimation, is 40 to 45%. The left ventricle has mildly decreased function. The left ventricle demonstrates global hypokinesis. Definity contrast agent was given IV to delineate the left ventricular  endocardial borders. The left ventricular internal cavity size was mildly dilated. There is no left ventricular hypertrophy. Left ventricular diastolic parameters were normal. Right Ventricle: The right ventricular size is normal. No increase in right ventricular wall thickness. Right ventricular systolic function is normal. Left Atrium: Left atrial size was mildly dilated. Right Atrium: Right atrial size was mildly dilated. Pericardium: There is no evidence of pericardial effusion. Mitral Valve: The mitral valve is normal in structure. Mild to moderate mitral valve regurgitation. MV peak gradient, 6.6 mmHg. The mean mitral valve gradient is 4.0 mmHg. Tricuspid Valve: The tricuspid valve is normal in structure. Tricuspid valve regurgitation is mild to moderate. Aortic Valve: The aortic valve is normal in structure. Aortic valve regurgitation is not visualized. Aortic valve mean gradient measures 11.0 mmHg. Aortic valve peak gradient measures 19.0 mmHg. Aortic valve area, by VTI measures 2.27 cm. Pulmonic Valve: The pulmonic valve was normal in structure. Pulmonic valve regurgitation is trivial. Aorta: The aortic root and ascending aorta are structurally normal, with no evidence of dilitation. IAS/Shunts: No atrial level shunt detected by color flow Doppler.  LEFT VENTRICLE PLAX 2D LVIDd:         5.23 cm   Diastology LVIDs:         4.07 cm   LV e' medial:    5.33 cm/s LV PW:         1.39 cm   LV E/e' medial:  18.0 LV IVS:        1.11 cm   LV e' lateral:   5.66 cm/s LVOT diam:     2.50 cm   LV E/e' lateral: 16.9 LV SV:         116 LV SV Index:   55 LVOT Area:     4.91 cm  RIGHT VENTRICLE RV Basal diam:  4.45 cm LEFT ATRIUM            Index        RIGHT ATRIUM           Index LA diam:      3.70 cm 1.76 cm/m   RA Area:     17.10 cm LA Vol (A4C): 73.3 ml 34.82 ml/m  RA Volume:   40.00 ml  19.00 ml/m  AORTIC VALVE  PULMONIC VALVE AV Area (Vmax):    2.61 cm      PV Vmax:       0.87 m/s AV Area (Vmean):   2.33 cm      PV Vmean:      57.000 cm/s AV Area (VTI):     2.27 cm      PV VTI:        0.157 m AV Vmax:           218.00 cm/s   PV Peak grad:  3.0 mmHg AV Vmean:          160.000 cm/s  PV Mean grad:  2.0 mmHg AV VTI:            0.513 m AV Peak Grad:      19.0 mmHg AV Mean Grad:      11.0 mmHg LVOT Vmax:         116.00 cm/s LVOT Vmean:        75.800 cm/s LVOT VTI:          0.237 m LVOT/AV VTI ratio: 0.46  AORTA Ao Root diam: 3.40 cm MITRAL VALVE                TRICUSPID VALVE MV Area (PHT): 3.44 cm     TR Peak grad:   43.6 mmHg MV Area VTI:   3.32 cm     TR Vmax:        330.00 cm/s MV Peak grad:  6.6 mmHg MV Mean grad:  4.0 mmHg     SHUNTS MV Vmax:       1.28 m/s     Systemic VTI:  0.24 m MV Vmean:      93.5 cm/s    Systemic Diam: 2.50 cm MV Decel Time: 221 msec MV E velocity: 95.75 cm/s MV A velocity: 106.00 cm/s MV E/A ratio:  0.90 Serafina Royals MD Electronically signed by Serafina Royals MD Signature Date/Time: 05/29/2021/12:28:40 PM    Final      Assessment and Recommendation  81 y.o. male with known coronary artery disease hypertension hyperlipidemia chronic obstructive pulmonary disease diabetes and cardiomyopathy status post previous PCI and stent placement with defibrillator pression and CRT-D on appropriate previous medication management with chest pain most consistent with demand ischemia and elevated troponin consistent with demand ischemia non-ST elevation myocardial infarction but no evidence of acute coronary syndrome 1.  Continuation of isosorbide for concerns of chest pain and demand ischemia and coronary artery disease which she has not had since admission 2.  Continuation of carvedilol amlodipine and  hydralazine f and clonidine 0.1 mg twice per day for about above blood pressure control if necessary due to the fact that the patient cannot use angiotensin receptor blocker, ACE inhibitor, and/or Entresto.   3.  Careful use of any diuretics due to severe chronic kidney disease exacerbation concerns with no current evidence of congestive heart failure 4.  No further adjustments of medication management appears to be needed today  5.  No further cardiac diagnostics necessary at this time 6.  Continue ambulation and f if ambulating well today okay for discharge home from cardiac standpoint with follow-up in 1 week  signed, Serafina Royals M.D. FACC

## 2021-06-03 NOTE — Progress Notes (Signed)
PROGRESS NOTE    Peter [name]  FWY:[id number] DOB: 27-Aug-1940 DOA: 05/28/2021 PCP: Tracie Harrier, MD  [id number]   Assessment & Plan:   Principal Problem:   Chest pain Active Problems:   SOB (shortness of breath)   CAD (coronary artery disease)   HTN (hypertension)   GERD (gastroesophageal reflux disease)   NSTEMI (non-ST elevated myocardial infarction) ([place])   Current tobacco use   S/P ICD (internal cardiac defibrillator) procedure   Anemia    Peter [name] is a 81 y.o.  male with medical history significant for systolic and diastolic heart failure, CAD s/p multiple stents, hypertension, status post AICD who presented with chest pain and dyspnea.     # Chest pain, ACS ruled out # Troponin elevation 2/2 demand ischemia # Hx of CAD s/p stents # Cardiomyopathy s/p AICD --trop up to 1600's.  Per cardio, Dr. Nehemiah Massed, avoided heparin gtt or anti-plt Plan: --no cath planned due to severe CKD --cont Imdur for chest pain --cont increased coreg --f/u with outpatient cardio 1 week after discharge  # acute on chronic anemia --Hgb 7.7 on presentation, baseline 10-11 from a month ago.  Pt reported 2 episodes of black stools PTA.  No prior GI workup.  Took plavix at home.  Hematuria occurred after presentation. --s/p 3u pRBC  --GI consulted --UGI study, negative for ulceration. Potentially some gastritis Plan: --monitor Hgb and transfuse with goal >8 --cont to hold plavix  # Acute urinary retention # BPH --Had prior hx of urinary retention, but current episode occurred after presentation.  Last saw Dr. Diamantina Providence in Nov 2022. --Had 700 ml in bladder scan x2, so Foley inserted. Plan: --cont home finasteride and Flomax --d/c Foley today and perform voiding trial, per pt request.  If fails voiding trial, then will have to discharge home with Foley and followup with outpatient urology.  # Hematuria, not POA, resolved --occurred only after I/O cath, in the setting of  taking plavix at home. Plan: --cont to hold plavix  HTN --BP elevated --cont home Imdur and amlodipine --cont hydralazine 100 mg TID (increased) --cont increased coreg --cont clonidine 0.1 mg BID (new by cardio) --cont to hold home lasix (discussed with cardio)  Chronic dyspnea COPD --per outside record, pt has a presume dx of COPD and extensive smoking hx.  No bronchodilator on home med list.  Chronic dyspnea was mentioned in outpatient cardio visits and pt was in the process of establishing with outpatient pulm (Dr. Lanney Gins) Plan: --cont DuoNeb QID --start Incruse  --need to establish with pulm outpatient   DVT prophylaxis: SCD/Compression stockings Code Status: Full code  Family Communication:   Level of care: Progressive Dispo:   The patient is from: home Anticipated d/c is to: home Anticipated d/c date is: tomorrow Patient currently is medically ready to d/c, with or without Foley   Subjective and Interval History:  Pt was planned for discharge home today with Foley, however, pt decided that he wanted the Foley out and have a voiding trial before going home.   Objective: Vitals:   06/03/21 1627 06/03/21 1658 06/03/21 1933 06/03/21 2316  BP: (!) 148/62  (!) 146/64 (!) 154/74  Pulse: 96  (!) 50 (!) 50  Resp: 14  18 18   Temp: (!) 97.5 F (36.4 C)  97.8 F (36.6 C) 97.7 F (36.5 C)  TempSrc: Oral  Oral   SpO2: 99% 98% 95% 96%  Weight:      Height:        Intake/Output  Summary (Last 24 hours) at 06/04/2021 0248 Last data filed at 06/03/2021 2000 Gross per 24 hour  Intake 240 ml  Output 500 ml  Net -260 ml   Filed Weights   05/28/21 1836  Weight: 92.5 kg    Examination:   Constitutional: NAD, AAOx3 HEENT: conjunctivae and lids normal, EOMI CV: No cyanosis.   RESP: normal respiratory effort, on RA SKIN: warm, dry Neuro: II - XII grossly intact.   Psych: Normal mood and affect.  Appropriate judgement and reason  Foley present   Data Reviewed:  I have personally reviewed following labs and imaging studies  CBC: Recent Labs  Lab 05/30/21 0542 05/31/21 0547 06/01/21 0429 06/02/21 0506 06/03/21 0624  WBC 6.6 6.9 5.8 6.0 5.6  HGB 7.9* 8.5* 8.0* 8.8* 9.0*  HCT 24.4* 26.5* 25.2* 27.7* 28.2*  MCV 90.7 90.4 91.6 91.1 92.2  PLT 128* 121* 116* 128* 270*   Basic Metabolic Panel: Recent Labs  Lab 05/30/21 0542 05/31/21 0547 06/01/21 0429 06/02/21 0506 06/03/21 0624  NA 143 146* 142 143 144  K 3.7 3.6 3.6 3.5 3.5  CL 112* 111 109 110 109  CO2 24 24 27 26 25   GLUCOSE 102* 114* 103* 106* 112*  BUN 73* 68* 72* 69* 66*  CREATININE 3.63* 3.49* 3.65* 3.70* 3.58*  CALCIUM 8.5* 8.6* 8.2* 8.3* 8.3*  MG 1.9 2.0 2.0 2.1 2.0   GFR: Estimated Creatinine Clearance: 18.5 mL/min (A) (by C-G formula based on SCr of 3.58 mg/dL (H)). Liver Function Tests: Recent Labs  Lab 05/28/21 1835  AST 16  ALT 15  ALKPHOS 75  BILITOT 0.6  PROT 5.2*  ALBUMIN 3.0*   No results for input(s): LIPASE, AMYLASE in the last 168 hours. No results for input(s): AMMONIA in the last 168 hours. Coagulation Profile: Recent Labs  Lab 05/28/21 2109  INR 1.3*   Cardiac Enzymes: No results for input(s): CKTOTAL, CKMB, CKMBINDEX, TROPONINI in the last 168 hours. BNP (last 3 results) No results for input(s): PROBNP in the last 8760 hours. HbA1C: No results for input(s): HGBA1C in the last 72 hours. CBG: No results for input(s): GLUCAP in the last 168 hours. Lipid Profile: No results for input(s): CHOL, HDL, LDLCALC, TRIG, CHOLHDL, LDLDIRECT in the last 72 hours.  Thyroid Function Tests: No results for input(s): TSH, T4TOTAL, FREET4, T3FREE, THYROIDAB in the last 72 hours. Anemia Panel: No results for input(s): VITAMINB12, FOLATE, FERRITIN, TIBC, IRON, RETICCTPCT in the last 72 hours.  Sepsis Labs: No results for input(s): PROCALCITON, LATICACIDVEN in the last 168 hours.  Recent Results (from the past 240 hour(s))  Resp Panel by RT-PCR (Flu A&B,  Covid) Nasopharyngeal Swab     Status: None   Collection Time: 05/28/21  7:10 PM   Specimen: Nasopharyngeal Swab; Nasopharyngeal(NP) swabs in vial transport medium  Result Value Ref Range Status   SARS Coronavirus 2 by RT PCR NEGATIVE NEGATIVE Final    Comment: (NOTE) SARS-CoV-2 target nucleic acids are NOT DETECTED.  The SARS-CoV-2 RNA is generally detectable in upper respiratory specimens during the acute phase of infection. The lowest concentration of SARS-CoV-2 viral copies this assay can detect is 138 copies/mL. A negative result does not preclude SARS-Cov-2 infection and should not be used as the sole basis for treatment or other patient management decisions. A negative result may occur with  improper specimen collection/handling, submission of specimen other than nasopharyngeal swab, presence of viral mutation(s) within the areas targeted by this assay, and inadequate number of viral copies(<138 copies/mL).  A negative result must be combined with clinical observations, patient history, and epidemiological information. The expected result is Negative.  Fact Sheet for Patients:  EntrepreneurPulse.com.au  Fact Sheet for Healthcare Providers:  IncredibleEmployment.be  This test is no t yet approved or cleared by the [place] FDA and  has been authorized for detection and/or diagnosis of SARS-CoV-2 by FDA under an Emergency Use Authorization (EUA). This EUA will remain  in effect (meaning this test can be used) for the duration of the COVID-19 declaration under Section 564(b)(1) of the Act, 21 U.S.C.section 360bbb-3(b)(1), unless the authorization is terminated  or revoked sooner.       Influenza A by PCR NEGATIVE NEGATIVE Final   Influenza B by PCR NEGATIVE NEGATIVE Final    Comment: (NOTE) The Xpert Xpress SARS-CoV-2/FLU/RSV plus assay is intended as an aid in the diagnosis of influenza from Nasopharyngeal swab specimens and should  not be used as a sole basis for treatment. Nasal washings and aspirates are unacceptable for Xpert Xpress SARS-CoV-2/FLU/RSV testing.  Fact Sheet for Patients: EntrepreneurPulse.com.au  Fact Sheet for Healthcare Providers: IncredibleEmployment.be  This test is not yet approved or cleared by the [place] FDA and has been authorized for detection and/or diagnosis of SARS-CoV-2 by FDA under an Emergency Use Authorization (EUA). This EUA will remain in effect (meaning this test can be used) for the duration of the COVID-19 declaration under Section 564(b)(1) of the Act, 21 U.S.C. section 360bbb-3(b)(1), unless the authorization is terminated or revoked.  Performed at [facility], [address], [place], [place] [address]       Radiology Studies: No results found.   Scheduled Meds:  amLODipine  10 mg Oral Daily   carvedilol  6.25 mg Oral BID WC   Chlorhexidine Gluconate Cloth  6 each Topical Daily   citalopram  20 mg Oral Daily   cloNIDine  0.1 mg Oral BID   finasteride  5 mg Oral Daily   hydrALAZINE  100 mg Oral TID   ipratropium-albuterol  3 mL Nebulization QID   isosorbide mononitrate  30 mg Oral Daily   pantoprazole  40 mg Oral BID   tamsulosin  0.4 mg Oral Daily   umeclidinium bromide  1 puff Inhalation Daily   Continuous Infusions:   LOS: 7 days     Enzo Bi, MD [facility] If 7PM-7AM, please contact night-coverage 06/04/2021, 2:48 AM

## 2021-06-03 NOTE — TOC Progression Note (Addendum)
Transition of Care [facility]) - Progression Note    Patient Details  Name: Peter [name] MRN: [id number] Date of Birth: 1940/09/25  Transition of Care [facility]) CM/SW [place], RN Phone Number: 06/03/2021, 3:07 PM  Clinical Narrative:  Daughter Peter [name] called this morning very upset about patient being discharged today, feels he is not medically stable and lives alone and unable to care for foley catheter. Advised daughter I will assess patient and discuss her concerns with the Attending. Attending voices plan to remove foley and give patient until 4pm to pass urine if able to do so will discharge home with follow up plan for Urologist to care for enlarged Prostate. Daughter given the information and voices understanding, however she did request resources for Nurse at night, resource list given to patient. Spoke with patient who voices feeling better today, has a lady friend, daughter and brother Peter [name] who assist with care. HHPT requests made to [place], [place], [facility] and [place] all declined. [employer] to provide rolling walker to room. 3:30pm: Patient to discharge tomorrow per Attending. Called daughter to update her on the postponed discharge, she voices relief. Patient given resource list for [place] and out patient PT services, daughter voices understanding.    Expected Discharge Plan: [place] Barriers to Discharge: Continued Medical Work up  Expected Discharge Plan and Services Expected Discharge Plan: [place]         Expected Discharge Date: 06/03/21                                     Social Determinants of Health (SDOH) Interventions    Readmission Risk Interventions Readmission Risk Prevention Plan 04/28/2021  Transportation Screening Complete  PCP or Specialist Appt within 3-5 Days Complete  HRI or [place] Complete  Social Work Consult for [place] Planning/Counseling Complete  Palliative Care  Screening Not Applicable  Medication Review Press photographer) Complete  Some recent data might be hidden

## 2021-06-03 NOTE — Evaluation (Signed)
Physical Therapy Evaluation Patient Details Name: Peter [name] MRN: [id number] DOB: [birth year] Today's Date: 06/03/2021  History of Present Illness  presented to ER secondary to chest pain; admitted for management of acute/chronic chest pain and cardiac work-up.  Did note elevated troponin (peak 1696), demand ischemia per cards (no ACS); s/p 3 units PRBC (current HgB 9.0)  Clinical Impression  PAtient resting in bed upon arrival to session; foley recently removed per RN.  Patient alert and oriented, follows commands and agreeable to participation with session.  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  Denies pain at this time.  Bilat UE/LE strength and ROM grossly symmetrical and WFL for basic transfers and gait; no focal weakness appreciated.  Able to complete bed mobility with mod indep; sit/stand, basic transfers and gait (200') with RW, cga/close sup. Demonstrates reciprocal stepping pattern with fair step height/length, fait cadence; minimal SOB, no chest pain endorsed with gait efforts.  Sats >93% on RA with gait.  Does benefit from use of RW for optimal safety/indep; patient voices agreement/understanding, amenable to using one at discharge (TOC informed/aware). Left on RA end of session; RN/MD informed/aware and to monitor for prolonged tolerance to RA. Would benefit from skilled PT to address above deficits and promote optimal return to PLOF.; Recommend transition to HHPT upon discharge from acute hospitalization.      Recommendations for follow up therapy are one component of a multi-disciplinary discharge planning process, led by the attending physician.  Recommendations may be updated based on patient status, additional functional criteria and insurance authorization.  Follow Up Recommendations Home health PT    Assistance Recommended at Discharge PRN  Patient can return home with the following       Equipment Recommendations Rolling walker (2 wheels)  Recommendations for Other Services        Functional Status Assessment Patient has had a recent decline in their functional status and demonstrates the ability to make significant improvements in function in a reasonable and predictable amount of time.     Precautions / Restrictions Precautions Precautions: Fall Restrictions Weight Bearing Restrictions: No      Mobility  Bed Mobility Overal bed mobility: Modified Independent                  Transfers Overall transfer level: Needs assistance Equipment used: Rolling walker (2 wheels) Transfers: Sit to/from Stand Sit to Stand: Min guard, Supervision           General transfer comment: cuing for hand placement to prevent pulling on RW    Ambulation/Gait Ambulation/Gait assistance: Min guard, Supervision Gait Distance (Feet): 200 Feet Assistive device: Rolling walker (2 wheels)   Gait velocity: 10' walk time, 7-8 seconds     General Gait Details: reciprocal stepping pattern with fair step height/length, fait cadence; minimal SOB, no chest pain endorsed with gait efforts.  Sats >93% on RA with gait.  Does benefit from use of RW for optimal safety/indep; patient voices agreement/understanding, amenable to using one at discharge (TOC informed/aware).  Stairs            Wheelchair Mobility    Modified Rankin (Stroke Patients Only)       Balance Overall balance assessment: Needs assistance Sitting-balance support: No upper extremity supported, Feet supported Sitting balance-Leahy Scale: Good     Standing balance support: Bilateral upper extremity supported Standing balance-Leahy Scale: Fair  Pertinent Vitals/Pain Pain Assessment Pain Assessment: No/denies pain    Home Living Family/patient expects to be discharged to:: Private residence Living Arrangements: Alone Available Help at Discharge: Family;Friend(s);Available PRN/intermittently Type of Home: House Home Access: Stairs to  enter Entrance Stairs-Rails: Right Entrance Stairs-Number of Steps: 4     Home Equipment: Shower seat;Grab bars - toilet;Other (comment)      Prior Function Prior Level of Function : Independent/Modified Independent;Driving             Mobility Comments: Ambulates without AD at baseline; recently participating with [facility] services immediately prior to admission ADLs Comments: Prior to illness at end of November, patient was I with all ADLs and IADLs including driving.  He enjoys going to the grocery store, time with friends and family, and doing things around his home.     Hand Dominance        Extremity/Trunk Assessment   Upper Extremity Assessment Upper Extremity Assessment: Overall WFL for tasks assessed    Lower Extremity Assessment Lower Extremity Assessment: Overall WFL for tasks assessed (grossly 4/5 throughout)       Communication      Cognition Arousal/Alertness: Awake/alert Behavior During Therapy: WFL for tasks assessed/performed Overall Cognitive Status: Within Functional Limits for tasks assessed                                          General Comments      Exercises Other Exercises Other Exercises: Reviewed role of PT and progressive mobility; reviewed safety with transfers and gait, O2 line management.  Patient voiced understanding of all information.   Assessment/Plan    PT Assessment Patient needs continued PT services  PT Problem List Decreased activity tolerance;Decreased balance;Decreased mobility;Cardiopulmonary status limiting activity       PT Treatment Interventions DME instruction;Stair training;Gait training;Functional mobility training;Therapeutic activities;Patient/family education;Balance training;Therapeutic exercise    PT Goals (Current goals can be found in the Care Plan section)  Acute Rehab PT Goals Patient Stated Goal: to return home PT Goal Formulation: With patient Time For Goal Achievement:  06/17/21 Potential to Achieve Goals: Good    Frequency Min 2X/week     Co-evaluation               AM-PAC PT "6 Clicks" Mobility  Outcome Measure Help needed turning from your back to your side while in a flat bed without using bedrails?: None Help needed moving from lying on your back to sitting on the side of a flat bed without using bedrails?: None Help needed moving to and from a bed to a chair (including a wheelchair)?: None Help needed standing up from a chair using your arms (e.g., wheelchair or bedside chair)?: A Little Help needed to walk in hospital room?: A Little Help needed climbing 3-5 steps with a railing? : A Little 6 Click Score: 21    End of Session Equipment Utilized During Treatment: Gait belt Activity Tolerance: Patient tolerated treatment well Patient left: in bed;with call bell/phone within reach;with bed alarm set Nurse Communication: Mobility status PT Visit Diagnosis: Muscle weakness (generalized) (M62.81);Difficulty in walking, not elsewhere classified (R26.2)    Time: 9211-9417 PT Time Calculation (min) (ACUTE ONLY): 18 min   Charges:   PT Evaluation $PT Eval Moderate Complexity: 1 Mod PT Treatments $Therapeutic Activity: 8-22 mins       Zabella Wease H. Owens Shark, PT, DPT, NCS 06/03/21, 10:04 AM [phone]

## 2021-06-04 LAB — CBC
HCT: 27.1 % — ABNORMAL LOW (ref 39.0–52.0)
Hemoglobin: 8.6 g/dL — ABNORMAL LOW (ref 13.0–17.0)
MCH: 29.4 pg (ref 26.0–34.0)
MCHC: 31.7 g/dL (ref 30.0–36.0)
MCV: 92.5 fL (ref 80.0–100.0)
Platelets: 121 10*3/uL — ABNORMAL LOW (ref 150–400)
RBC: 2.93 MIL/uL — ABNORMAL LOW (ref 4.22–5.81)
RDW: 13.8 % (ref 11.5–15.5)
WBC: 6.7 10*3/uL (ref 4.0–10.5)
nRBC: 0 % (ref 0.0–0.2)

## 2021-06-04 LAB — BASIC METABOLIC PANEL
Anion gap: 8 (ref 5–15)
BUN: 62 mg/dL — ABNORMAL HIGH (ref 8–23)
CO2: 26 mmol/L (ref 22–32)
Calcium: 8 mg/dL — ABNORMAL LOW (ref 8.9–10.3)
Chloride: 107 mmol/L (ref 98–111)
Creatinine, Ser: 3.58 mg/dL — ABNORMAL HIGH (ref 0.61–1.24)
GFR, Estimated: 16 mL/min — ABNORMAL LOW (ref >60)
Glucose, Bld: 105 mg/dL — ABNORMAL HIGH (ref 70–99)
Potassium: 3.4 mmol/L — ABNORMAL LOW (ref 3.5–5.1)
Sodium: 141 mmol/L (ref 135–145)

## 2021-06-04 LAB — MAGNESIUM: Magnesium: 1.9 mg/dL (ref 1.7–2.4)

## 2021-06-04 MED ORDER — IPRATROPIUM-ALBUTEROL 0.5-2.5 (3) MG/3ML IN SOLN: 3.0000 mL | Freq: Two times a day (BID) | RESPIRATORY_TRACT | Status: DC

## 2021-06-04 NOTE — Progress Notes (Signed)
Mobility Specialist - Progress Note   06/04/21 1200  Mobility  Activity Refused mobility     Pt sitting EOB utilizing RA upon arrival. Pt refused session, d/t upcoming discharge. Pt left with family at bedside.  Merrily Brittle Mobility Specialist 06/04/21, 12:08 PM

## 2021-06-04 NOTE — Progress Notes (Signed)
Patient alert and oriented, vss, no complaints of pain.  Escorted out of hospital via wheelchair by volunteers  D/c telemetry and PIV

## 2021-06-04 NOTE — Discharge Summary (Signed)
Physician Discharge Summary  Peter [name] XLK:[id number] DOB: 1940-07-05 DOA: 05/28/2021  PCP: Tracie Harrier, MD  Admit date: 05/28/2021 Discharge date: 06/04/2021  Admitted From: Home Disposition:  Home with home health  Recommendations for Outpatient Follow-up:  Follow up with PCP in 1-2 weeks   Home Health:Yes Equipment/Devices:None  Discharge Condition:Stable CODE STATUS:FULL Diet recommendation: cardiac  Brief/Interim Summary:  Peter [name] is a 81 y.o.  male with medical history significant for systolic and diastolic heart failure, CAD s/p multiple stents, hypertension, status post AICD who presented with chest pain and dyspnea.  Patient noted to have troponin elevation with peak at 1600.  Cardiology involved.  Per their recommendations avoided antiplatelet or heparin.  No catheterization planned due to severe CKD.  Patient also had acute on chronic anemia with hemoglobin 7.7 baseline 10-11.  Did report black stools prior to arrival.  Patient undergoing outpatient GI work-up.  Transfused 2 units PRBC.  GI consulted.  Upper GI study, negative for ulceration.  Potentially some gastritis.  Patient stabilized at time of discharge.  Does have some issues with urinary retention.  Foley catheter removed and finasteride and Flomax were initiated per home regimen.  Patient passed voiding trial on 2/21.  Patient stable for discharge on 2/22.  Follow-up outpatient PCP and cardiology as well as urology   Discharge Diagnoses:  Principal Problem:   Chest pain Active Problems:   SOB (shortness of breath)   CAD (coronary artery disease)   HTN (hypertension)   GERD (gastroesophageal reflux disease)   NSTEMI (non-ST elevated myocardial infarction) ([place])   Current tobacco use   S/P ICD (internal cardiac defibrillator) procedure   Anemia    Discharge Instructions  Discharge Instructions     Diet - low sodium heart healthy   Complete by: As directed    Discharge  instructions   Complete by: As directed    You came in with worsening anemia, and received blood transfusion.  Please hold home plavix until outpatient followup with cardiology.  Also need to follow up with GI to complete GI workup.  Due to your reduced kidney function, your home lasix and spironolactone are held pending outpatient followup with cardiology.    Your blood pressure has been high, so cardiology started a new blood pressure medication, clonidine.  Due to your urinary retention, you are discharged with Foley to be followed up with your outpatient urologist.  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  I have started on your a daily inhaler Incruse Ellipta.  Please establish with pulmonologist Dr. Lanney Gins.   Dr. Enzo Bi - -   Increase activity slowly   Complete by: As directed       Allergies as of 06/04/2021       Reactions   Iodinated Contrast Media Shortness Of Breath   Iodinated Glycerol  [glycerol, Iodinated] Shortness Of Breath        Medication List     TAKE these medications    amLODipine 10 MG tablet Commonly known as: NORVASC Take 1 tablet (10 mg total) by mouth daily.   atorvastatin 40 MG tablet Commonly known as: LIPITOR Take 1 tablet (40 mg total) by mouth daily at 6 PM.   calcitRIOL 0.25 MCG capsule Commonly known as: ROCALTROL Take 0.25 mcg by mouth daily.   carvedilol 6.25 MG tablet Commonly known as: COREG Take 1 tablet (6.25 mg total) by mouth 2 (two) times daily with a meal.   cetirizine 10 MG tablet  Commonly known as: ZYRTEC Take 10 mg by mouth.   citalopram 20 MG tablet Commonly known as: CELEXA Take 20 mg by mouth daily.   clobetasol cream 0.05 % Commonly known as: TEMOVATE Apply 1 application topically 2 (two) times daily as needed (psoriasis).   cloNIDine 0.1 MG tablet Commonly known as: CATAPRES Take 1 tablet (0.1 mg total) by mouth 2 (two) times daily.   clopidogrel 75 MG tablet Commonly  known as: PLAVIX Hold until outpatient followup with cardiology due to worsening anemia. What changed: additional instructions   cyanocobalamin 1000 MCG tablet Take 1,000 mcg by mouth daily.   finasteride 5 MG tablet Commonly known as: PROSCAR Take 1 tablet (5 mg total) by mouth daily.   furosemide 40 MG tablet Commonly known as: LASIX Hold until outpatient followup with cardiology. What changed:  how much to take how to take this when to take this additional instructions   GLUCOSAMINE-CHONDROITIN-VIT C PO Take 1 tablet by mouth daily.   hydrALAZINE 100 MG tablet Commonly known as: APRESOLINE Take 1 tablet (100 mg total) by mouth 3 (three) times daily. What changed: when to take this   isosorbide mononitrate 30 MG 24 hr tablet Commonly known as: IMDUR Take 1 tablet (30 mg total) by mouth daily.   omeprazole 20 MG capsule Commonly known as: PRILOSEC Take 20 mg by mouth in the morning.   spironolactone 25 MG tablet Commonly known as: ALDACTONE Hold until followup with your outpatient doctor due to worsening kidney function.   tamsulosin 0.4 MG Caps capsule Commonly known as: FLOMAX Take 1 capsule (0.4 mg total) by mouth daily.   umeclidinium bromide 62.5 MCG/ACT Aepb Commonly known as: INCRUSE ELLIPTA Inhale 1 puff into the lungs daily.               Durable Medical Equipment  (From admission, onward)           Start     Ordered   06/03/21 0954  For home use only DME Walker rolling  Once       Question Answer Comment  Walker: With 5 Inch Wheels   Patient needs a walker to treat with the following condition Weakness      06/03/21 0953            Follow-up Information     Tracie Harrier, MD Follow up on 06/06/2021.   Specialty: Internal Medicine Why: @ 1:30pm Contact information: [place] [address] [phone]         Yolonda Kida, MD Follow up in 1 week(s).   Specialties:  Cardiology, Internal Medicine Why: @ 10:30pm Contact information: [place] [address] [phone]         Ottie Glazier, MD Follow up on 06/13/2021.   Specialty: Pulmonary Disease Why: to establish as new patient. @ 2pm Contact information: [place] [address] [phone]                Allergies  Allergen Reactions   Iodinated Contrast Media Shortness Of Breath   Iodinated Glycerol  [Glycerol, Iodinated] Shortness Of Breath    Consultations: Cardiology GI   Procedures/Studies: CT CHEST WO CONTRAST  Result Date: 05/28/2021 CLINICAL DATA:  Shortness of breath. History of congestive heart failure. Substantial smoking history. EXAM: CT CHEST WITHOUT CONTRAST TECHNIQUE: Multidetector CT imaging of the chest was performed following the standard protocol without IV contrast. RADIATION DOSE REDUCTION: This exam was performed according  to the departmental dose-optimization program which includes automated exposure control, adjustment of the mA and/or kV according to patient size and/or use of iterative reconstruction technique. COMPARISON:  Chest radiograph 05/01/2021 and chest CT 04/12/2021 FINDINGS: Cardiovascular: Coronary, aortic arch, and branch vessel atherosclerotic vascular disease. Coronary stents. AICD noted. 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 Small anterior pericardial effusion. Borderline cardiomegaly. Descending thoracic aortic and upper abdominal aortic stent. Mediastinum/Nodes: AP window lymph node 0.8 cm in short axis on image 62 series 2, stable. Low-density subcarinal lymph nodes including a 1.0 cm node on image 89 series 2, formerly the same. Other scattered small mediastinal lymph nodes are present. Lungs/Pleura: Small bilateral pleural effusions although both are increased from 04/12/2021. Centrilobular emphysema. Mild secondary pulmonary  lobular septal accentuation of the apices. Bilateral airway thickening. 1.0 by 0.6 cm stable ground-glass density nodule in the apicoposterior segment left upper lobe on image 22 series 3. Ground-glass density peribronchovascular nodule in the left upper lobe on image 33 series 3 is stable at 1.2 by 0.7 cm. Ground-glass density 1.7 by 1.0 cm right lower lobe nodule on image 72 series 3. Increased density of a 0.8 by 0.5 cm right upper lobe sub solid pulmonary nodule on image 62 series 3 compared to the 04/12/2021 exam. Mildly increased atelectasis in the left lower lobe compared to previous. Upper Abdomen: Small depending gallstones in the gallbladder. Upper abdominal aortic stent with stents in the proximal celiac trunk, SMA, and renal arteries. We partially image a ventral hernia containing adipose tissue on image 185 series 2. Musculoskeletal: Bilateral gynecomastia. 2.0 by 0.6 cm sclerotic lesion in the right posterolateral sixth rib, no change from 12/14/2011, considered benign. IMPRESSION: 1. Small but mildly worsened bilateral pleural effusions with associated passive atelectasis. 2. Airway thickening is present, suggesting bronchitis or reactive airways disease. 3. Several ground-glass density pulmonary nodules are present including two left apical nodule stable from 04/12/2021 (questionable precursors of these lesions on 12/14/2011) and a right upper lobe nodule which has increased in density compared to that time, as well as a stable right lower lobe ground-glass density pulmonary nodule. Low-grade adenocarcinoma is a possibility and surveillance is recommended. 4. Aortic Atherosclerosis (ICD10-I70.0) and Emphysema (ICD10-J43.9). Coronary atherosclerosis. 5. 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 Recommend semi-annual imaging followup by CTA or MRA and referral to cardiothoracic surgery if not already obtained. This recommendation follows 2010 ACCF/AHA/AATS/ACR/ASA/SCA/SCAI/SIR/STS/SVM Guidelines for the  Diagnosis and Management of Patients With Thoracic Aortic Disease. Circulation. 2010; 121: T062-I94. Aortic aneurysm NOS (ICD10-I71.9) 6. Coronary and abdominal aortic stents along with stents in the celiac trunk, SMA, and proximal renal arteries. AICD noted. 7. Small anterior pericardial effusion. 8. Cholelithiasis. 9. Partially imaged ventral hernia containing adipose tissue. Electronically Signed   By: Van Clines M.D.   On: 05/28/2021 08:44   DG Chest Portable 1 View  Result Date: 05/28/2021 CLINICAL DATA:  Shortness of breath, chest pain EXAM: PORTABLE CHEST 1 VIEW COMPARISON:  05/01/2021 FINDINGS: Lungs are clear.  No pleural effusion or pneumothorax. The heart is normal in size.  Left subclavian ICD. Mild degenerative changes of the mid thoracic spine. IMPRESSION: No evidence of acute cardiopulmonary disease. Electronically Signed   By: Julian Hy M.D.   On: 05/28/2021 19:04   DG UGI W SINGLE CM (SOL OR THIN BA)  Result Date: 05/30/2021 CLINICAL DATA:  Upper GI bleed. Evaluate for mucosal ulceration/defect. EXAM: DG UGI W SINGLE CM TECHNIQUE:  Scout radiograph was obtained. Single contrast examination was performed using thin liquid barium. This exam was performed by Candiss Norse, PA, and was supervised and interpreted by myself. FLUOROSCOPY TIME:  Radiation Exposure Index (as provided by the fluoroscopic device): 44.4 mGy COMPARISON:  Chest CT 05/27/2021.  Abdominopelvic CT 04/04/2021. FINDINGS: Scout Radiograph: Due to reported contrast allergy, the study was performed with thin barium after obtaining supine and erect views of the abdomen. The bowel gas pattern is nonobstructive. There is no evidence of pneumoperitoneum. Extensive postsurgical changes are noted, related to previous aortoiliac stent grafting. The patient has an AICD. Study was performed in the supine and semi erect positions. Patient was unable to stand or lie prone. The patient swallowed the barium without  difficulty. The esophageal motility appears within normal limits. There is no evidence of stricture, mass or ulceration. Mild gastric fold thickening is noted which could indicate gastritis. No evidence of mucosal ulceration. The duodenum appears normal. IMPRESSION: 1. No specific cause for upper GI bleeding identified. There is no evidence of mucosal ulceration. 2. Gastric fold thickening which could indicate gastritis. 3. No significant esophageal abnormality. Electronically Signed   By: Richardean Sale M.D.   On: 05/30/2021 13:12   ECHOCARDIOGRAM COMPLETE  Result Date: 05/29/2021    ECHOCARDIOGRAM REPORT   Patient Name:   MERCURY ROCK Date of Exam: 05/29/2021 Medical Rec #:  [id number]        Height:       70.0 in Accession #:    [id number]       Weight:       204.0 lb Date of Birth:  1940/07/29       BSA:          2.105 m Patient Age:    29 years         BP:           166/66 mmHg Patient Gender: M                HR:           62 bpm. Exam Location:  [facility] Procedure: 2D Echo, Color Doppler, Cardiac Doppler and Intracardiac            Opacification Agent Indications:     R07.9 Chest Pain  History:         Patient has prior history of Echocardiogram examinations, most                  recent 04/04/2021. CHF, CAD, Pacemaker; Risk Factors:HCL.  Sonographer:     Charmayne Sheer Referring Phys:  [place] Diagnosing Phys: Serafina Royals MD  Sonographer Comments: Technically difficult study due to poor echo windows. Image acquisition challenging due to respiratory motion. IMPRESSIONS  1. Left ventricular ejection fraction, by estimation, is 40 to 45%. The left ventricle has mildly decreased function. The left ventricle demonstrates global hypokinesis. The left ventricular internal cavity size was mildly dilated. Left ventricular diastolic parameters were normal.  2. Right ventricular systolic function is normal. The right ventricular size is normal.  3. Left atrial size was mildly dilated.  4. Right atrial  size was mildly dilated.  5. The mitral valve is normal in structure. Mild to moderate mitral valve regurgitation.  6. Tricuspid valve regurgitation is mild to moderate.  7. The aortic valve is normal in structure. Aortic valve regurgitation is not visualized. FINDINGS  Left Ventricle: Left ventricular ejection fraction, by estimation, is 40 to 45%. The left ventricle  has mildly decreased function. The left ventricle demonstrates global hypokinesis. Definity contrast agent was given IV to delineate the left ventricular  endocardial borders. The left ventricular internal cavity size was mildly dilated. There is no left ventricular hypertrophy. Left ventricular diastolic parameters were normal. Right Ventricle: The right ventricular size is normal. No increase in right ventricular wall thickness. Right ventricular systolic function is normal. Left Atrium: Left atrial size was mildly dilated. Right Atrium: Right atrial size was mildly dilated. Pericardium: There is no evidence of pericardial effusion. Mitral Valve: The mitral valve is normal in structure. Mild to moderate mitral valve regurgitation. MV peak gradient, 6.6 mmHg. The mean mitral valve gradient is 4.0 mmHg. Tricuspid Valve: The tricuspid valve is normal in structure. Tricuspid valve regurgitation is mild to moderate. Aortic Valve: The aortic valve is normal in structure. Aortic valve regurgitation is not visualized. Aortic valve mean gradient measures 11.0 mmHg. Aortic valve peak gradient measures 19.0 mmHg. Aortic valve area, by VTI measures 2.27 cm. Pulmonic Valve: The pulmonic valve was normal in structure. Pulmonic valve regurgitation is trivial. Aorta: The aortic root and ascending aorta are structurally normal, with no evidence of dilitation. IAS/Shunts: No atrial level shunt detected by color flow Doppler.  LEFT VENTRICLE PLAX 2D LVIDd:         5.23 cm   Diastology LVIDs:         4.07 cm   LV e' medial:    5.33 cm/s LV PW:         1.39 cm   LV E/e'  medial:  18.0 LV IVS:        1.11 cm   LV e' lateral:   5.66 cm/s LVOT diam:     2.50 cm   LV E/e' lateral: 16.9 LV SV:         116 LV SV Index:   55 LVOT Area:     4.91 cm  RIGHT VENTRICLE RV Basal diam:  4.45 cm LEFT ATRIUM           Index        RIGHT ATRIUM           Index LA diam:      3.70 cm 1.76 cm/m   RA Area:     17.10 cm LA Vol (A4C): 73.3 ml 34.82 ml/m  RA Volume:   40.00 ml  19.00 ml/m  AORTIC VALVE                     PULMONIC VALVE AV Area (Vmax):    2.61 cm      PV Vmax:       0.87 m/s AV Area (Vmean):   2.33 cm      PV Vmean:      57.000 cm/s AV Area (VTI):     2.27 cm      PV VTI:        0.157 m AV Vmax:           218.00 cm/s   PV Peak grad:  3.0 mmHg AV Vmean:          160.000 cm/s  PV Mean grad:  2.0 mmHg AV VTI:            0.513 m AV Peak Grad:      19.0 mmHg AV Mean Grad:      11.0 mmHg LVOT Vmax:         116.00 cm/s LVOT Vmean:  75.800 cm/s LVOT VTI:          0.237 m LVOT/AV VTI ratio: 0.46  AORTA Ao Root diam: 3.40 cm MITRAL VALVE                TRICUSPID VALVE MV Area (PHT): 3.44 cm     TR Peak grad:   43.6 mmHg MV Area VTI:   3.32 cm     TR Vmax:        330.00 cm/s MV Peak grad:  6.6 mmHg MV Mean grad:  4.0 mmHg     SHUNTS MV Vmax:       1.28 m/s     Systemic VTI:  0.24 m MV Vmean:      93.5 cm/s    Systemic Diam: 2.50 cm MV Decel Time: 221 msec MV E velocity: 95.75 cm/s MV A velocity: 106.00 cm/s MV E/A ratio:  0.90 Serafina Royals MD Electronically signed by Serafina Royals MD Signature Date/Time: 05/29/2021/12:28:40 PM    Final       Subjective: Seen and examined on day of discharge.  Stable no distress.  Stable for discharge home  Discharge Exam: Vitals:   06/04/21 0441 06/04/21 0939  BP: (!) 157/71 (!) 148/50  Pulse: (!) 58 (!) 56  Resp: 14 12  Temp: 98.2 F (36.8 C) 98 F (36.7 C)  SpO2: 97% 96%   Vitals:   06/03/21 1933 06/03/21 2316 06/04/21 0441 06/04/21 0939  BP: (!) 146/64 (!) 154/74 (!) 157/71 (!) 148/50  Pulse: (!) 50 (!) 50 (!) 58 (!) 56   Resp: 18 18 14 12   Temp: 97.8 F (36.6 C) 97.7 F (36.5 C) 98.2 F (36.8 C) 98 F (36.7 C)  TempSrc: Oral     SpO2: 95% 96% 97% 96%  Weight:      Height:        General: Pt is alert, awake, not in acute distress Cardiovascular: RRR, S1/S2 +, no rubs, no gallops Respiratory: CTA bilaterally, no wheezing, no rhonchi Abdominal: Soft, NT, ND, bowel sounds + Extremities: no edema, no cyanosis    The results of significant diagnostics from this hospitalization (including imaging, microbiology, ancillary and laboratory) are listed below for reference.     Microbiology: Recent Results (from the past 240 hour(s))  Resp Panel by RT-PCR (Flu A&B, Covid) Nasopharyngeal Swab     Status: None   Collection Time: 05/28/21  7:10 PM   Specimen: Nasopharyngeal Swab; Nasopharyngeal(NP) swabs in vial transport medium  Result Value Ref Range Status   SARS Coronavirus 2 by RT PCR NEGATIVE NEGATIVE Final    Comment: (NOTE) SARS-CoV-2 target nucleic acids are NOT DETECTED.  The SARS-CoV-2 RNA is generally detectable in upper respiratory specimens during the acute phase of infection. The lowest concentration of SARS-CoV-2 viral copies this assay can detect is 138 copies/mL. A negative result does not preclude SARS-Cov-2 infection and should not be used as the sole basis for treatment or other patient management decisions. A negative result may occur with  improper specimen collection/handling, submission of specimen other than nasopharyngeal swab, presence of viral mutation(s) within the areas targeted by this assay, and inadequate number of viral copies(<138 copies/mL). A negative result must be combined with clinical observations, patient history, and epidemiological information. The expected result is Negative.  Fact Sheet for Patients:  EntrepreneurPulse.com.au  Fact Sheet for Healthcare Providers:  IncredibleEmployment.be  This test is no t yet  approved or cleared by the [place] and  has been authorized for  detection and/or diagnosis of SARS-CoV-2 by FDA under an Emergency Use Authorization (EUA). This EUA will remain  in effect (meaning this test can be used) for the duration of the COVID-19 declaration under Section 564(b)(1) of the Act, 21 U.S.C.section 360bbb-3(b)(1), unless the authorization is terminated  or revoked sooner.       Influenza A by PCR NEGATIVE NEGATIVE Final   Influenza B by PCR NEGATIVE NEGATIVE Final    Comment: (NOTE) The Xpert Xpress SARS-CoV-2/FLU/RSV plus assay is intended as an aid in the diagnosis of influenza from Nasopharyngeal swab specimens and should not be used as a sole basis for treatment. Nasal washings and aspirates are unacceptable for Xpert Xpress SARS-CoV-2/FLU/RSV testing.  Fact Sheet for Patients: EntrepreneurPulse.com.au  Fact Sheet for Healthcare Providers: IncredibleEmployment.be  This test is not yet approved or cleared by the [place] FDA and has been authorized for detection and/or diagnosis of SARS-CoV-2 by FDA under an Emergency Use Authorization (EUA). This EUA will remain in effect (meaning this test can be used) for the duration of the COVID-19 declaration under Section 564(b)(1) of the Act, 21 U.S.C. section 360bbb-3(b)(1), unless the authorization is terminated or revoked.  Performed at [facility], [address] [place], [place] [address]      Labs: BNP (last 3 results) Recent Labs    04/11/21 1436 04/27/21 1305 05/28/21 1835  BNP 876.6* 2,988.6* 2,683.4*   Basic Metabolic Panel: Recent Labs  Lab 05/31/21 0547 06/01/21 0429 06/02/21 0506 06/03/21 0624 06/04/21 0555  NA 146* 142 143 144 141  K 3.6 3.6 3.5 3.5 3.4*  CL 111 109 110 109 107  CO2 24 27 26 25 26   GLUCOSE 114* 103* 106* 112* 105*  BUN 68* 72* 69* 66* 62*  CREATININE 3.49* 3.65* 3.70* 3.58* 3.58*  CALCIUM 8.6* 8.2*  8.3* 8.3* 8.0*  MG 2.0 2.0 2.1 2.0 1.9   Liver Function Tests: Recent Labs  Lab 05/28/21 1835  AST 16  ALT 15  ALKPHOS 75  BILITOT 0.6  PROT 5.2*  ALBUMIN 3.0*   No results for input(s): LIPASE, AMYLASE in the last 168 hours. No results for input(s): AMMONIA in the last 168 hours. CBC: Recent Labs  Lab 05/31/21 0547 06/01/21 0429 06/02/21 0506 06/03/21 0624 06/04/21 0555  WBC 6.9 5.8 6.0 5.6 6.7  HGB 8.5* 8.0* 8.8* 9.0* 8.6*  HCT 26.5* 25.2* 27.7* 28.2* 27.1*  MCV 90.4 91.6 91.1 92.2 92.5  PLT 121* 116* 128* 124* 121*   Cardiac Enzymes: No results for input(s): CKTOTAL, CKMB, CKMBINDEX, TROPONINI in the last 168 hours. BNP: Invalid input(s): POCBNP CBG: No results for input(s): GLUCAP in the last 168 hours. D-Dimer No results for input(s): DDIMER in the last 72 hours. Hgb A1c No results for input(s): HGBA1C in the last 72 hours. Lipid Profile No results for input(s): CHOL, HDL, LDLCALC, TRIG, CHOLHDL, LDLDIRECT in the last 72 hours. Thyroid function studies No results for input(s): TSH, T4TOTAL, T3FREE, THYROIDAB in the last 72 hours.  Invalid input(s): FREET3 Anemia work up No results for input(s): VITAMINB12, FOLATE, FERRITIN, TIBC, IRON, RETICCTPCT in the last 72 hours. Urinalysis    Component Value Date/Time   COLORURINE YELLOW (A) 04/22/2021 0226   APPEARANCEUR CLEAR (A) 04/22/2021 0226   APPEARANCEUR Clear 12/07/2019 1417   LABSPEC 1.012 04/22/2021 0226   PHURINE 5.0 04/22/2021 0226   GLUCOSEU 50 (A) 04/22/2021 0226   HGBUR NEGATIVE 04/22/2021 0226   BILIRUBINUR NEGATIVE 04/22/2021 0226   BILIRUBINUR Negative 12/07/2019 1417  KETONESUR NEGATIVE 04/22/2021 0226   PROTEINUR >=300 (A) 04/22/2021 0226   UROBILINOGEN 0.2 04/06/2011 0900   NITRITE NEGATIVE 04/22/2021 0226   LEUKOCYTESUR NEGATIVE 04/22/2021 0226   Sepsis Labs Invalid input(s): PROCALCITONIN,  WBC,  LACTICIDVEN Microbiology Recent Results (from the past 240 hour(s))  Resp Panel by  RT-PCR (Flu A&B, Covid) Nasopharyngeal Swab     Status: None   Collection Time: 05/28/21  7:10 PM   Specimen: Nasopharyngeal Swab; Nasopharyngeal(NP) swabs in vial transport medium  Result Value Ref Range Status   SARS Coronavirus 2 by RT PCR NEGATIVE NEGATIVE Final    Comment: (NOTE) SARS-CoV-2 target nucleic acids are NOT DETECTED.  The SARS-CoV-2 RNA is generally detectable in upper respiratory specimens during the acute phase of infection. The lowest concentration of SARS-CoV-2 viral copies this assay can detect is 138 copies/mL. A negative result does not preclude SARS-Cov-2 infection and should not be used as the sole basis for treatment or other patient management decisions. A negative result may occur with  improper specimen collection/handling, submission of specimen other than nasopharyngeal swab, presence of viral mutation(s) within the areas targeted by this assay, and inadequate number of viral copies(<138 copies/mL). A negative result must be combined with clinical observations, patient history, and epidemiological information. The expected result is Negative.  Fact Sheet for Patients:  EntrepreneurPulse.com.au  Fact Sheet for Healthcare Providers:  IncredibleEmployment.be  This test is no t yet approved or cleared by the [place] FDA and  has been authorized for detection and/or diagnosis of SARS-CoV-2 by FDA under an Emergency Use Authorization (EUA). This EUA will remain  in effect (meaning this test can be used) for the duration of the COVID-19 declaration under Section 564(b)(1) of the Act, 21 U.S.C.section 360bbb-3(b)(1), unless the authorization is terminated  or revoked sooner.       Influenza A by PCR NEGATIVE NEGATIVE Final   Influenza B by PCR NEGATIVE NEGATIVE Final    Comment: (NOTE) The Xpert Xpress SARS-CoV-2/FLU/RSV plus assay is intended as an aid in the diagnosis of influenza from Nasopharyngeal swab  specimens and should not be used as a sole basis for treatment. Nasal washings and aspirates are unacceptable for Xpert Xpress SARS-CoV-2/FLU/RSV testing.  Fact Sheet for Patients: EntrepreneurPulse.com.au  Fact Sheet for Healthcare Providers: IncredibleEmployment.be  This test is not yet approved or cleared by the [place] FDA and has been authorized for detection and/or diagnosis of SARS-CoV-2 by FDA under an Emergency Use Authorization (EUA). This EUA will remain in effect (meaning this test can be used) for the duration of the COVID-19 declaration under Section 564(b)(1) of the Act, 21 U.S.C. section 360bbb-3(b)(1), unless the authorization is terminated or revoked.  Performed at [facility], [address], [place], [place] [address]      Time coordinating discharge: Over 30 minutes  SIGNED:   Sidney Ace, MD  [facility] 06/04/2021, 4:34 PM Pager   If 7PM-7AM, please contact night-coverage

## 2021-06-04 NOTE — TOC Transition Note (Addendum)
Transition of Care [facility]) - CM/SW Discharge Note   Patient Details  Name: Peter [name] MRN: [id number] Date of Birth: January 05, 1941  [facility] CM/SW Contact:  Alberteen Sam, LCSW Phone Number: 06/04/2021, 11:40 AM   Clinical Narrative:     Patient to discharge home today, Corene Cornea with [facility] able to accept for [facility] PT.   Rolling walker was delivered 2/21 at bedside.   Patient's daughter to pick up today at time of discharge.   Per daughter they have [place] that assists with nursing help as well. Acknowledges receiving the list of private pay care agencies as well.   No other needs identified at this time.   Final next level of care: [place] Barriers to Discharge: No Barriers Identified   Patient Goals and CMS Choice Patient states their goals for this hospitalization and ongoing recovery are:: to go home CMS Medicare.gov Compare Post Acute Care list provided to:: Patient Choice offered to / list presented to : Patient  Discharge Placement                Patient to be transferred to facility by: daughter   Patient and family notified of of transfer: 06/04/21  Discharge Plan and Services                DME Arranged: Gilford Rile rolling DME Agency: [employer] Date DME Agency Contacted: 06/03/21     HH Arranged: PT [place] Agency: [facility] ([place]) Date HH Agency Contacted: 06/04/21 Time [place]: 1140 Representative spoke with at [place]: [place] ([place]) Interventions     Readmission Risk Interventions Readmission Risk Prevention Plan 04/28/2021  Transportation Screening Complete  PCP or Specialist Appt within 3-5 Days Complete  HRI or [place] Complete  Social Work Consult for [place] Planning/Counseling Complete  Palliative Care Screening Not Applicable  Medication Review Press photographer) Complete  Some recent data might be hidden

## 2021-06-05 ENCOUNTER — Other Ambulatory Visit: Payer: Self-pay

## 2021-06-05 ENCOUNTER — Ambulatory Visit: Payer: PPO | Attending: Family | Admitting: Family

## 2021-06-05 ENCOUNTER — Encounter: Payer: Self-pay | Admitting: Family

## 2021-06-05 VITALS — BP 148/58 | HR 94 | Resp 16 | Ht 71.0 in | Wt 202.0 lb

## 2021-06-05 DIAGNOSIS — I890 Lymphedema, not elsewhere classified: Secondary | ICD-10-CM | POA: Insufficient documentation

## 2021-06-05 DIAGNOSIS — N189 Chronic kidney disease, unspecified: Secondary | ICD-10-CM | POA: Diagnosis not present

## 2021-06-05 DIAGNOSIS — Z9981 Dependence on supplemental oxygen: Secondary | ICD-10-CM | POA: Diagnosis not present

## 2021-06-05 DIAGNOSIS — E876 Hypokalemia: Secondary | ICD-10-CM | POA: Insufficient documentation

## 2021-06-05 DIAGNOSIS — Z955 Presence of coronary angioplasty implant and graft: Secondary | ICD-10-CM | POA: Insufficient documentation

## 2021-06-05 DIAGNOSIS — E785 Hyperlipidemia, unspecified: Secondary | ICD-10-CM | POA: Diagnosis not present

## 2021-06-05 DIAGNOSIS — R079 Chest pain, unspecified: Secondary | ICD-10-CM | POA: Diagnosis not present

## 2021-06-05 DIAGNOSIS — I130 Hypertensive heart and chronic kidney disease with heart failure and stage 1 through stage 4 chronic kidney disease, or unspecified chronic kidney disease: Secondary | ICD-10-CM | POA: Insufficient documentation

## 2021-06-05 DIAGNOSIS — I5022 Chronic systolic (congestive) heart failure: Secondary | ICD-10-CM | POA: Diagnosis not present

## 2021-06-05 DIAGNOSIS — R339 Retention of urine, unspecified: Secondary | ICD-10-CM | POA: Diagnosis not present

## 2021-06-05 DIAGNOSIS — I252 Old myocardial infarction: Secondary | ICD-10-CM | POA: Diagnosis not present

## 2021-06-05 DIAGNOSIS — I10 Essential (primary) hypertension: Secondary | ICD-10-CM | POA: Diagnosis not present

## 2021-06-05 DIAGNOSIS — J449 Chronic obstructive pulmonary disease, unspecified: Secondary | ICD-10-CM | POA: Insufficient documentation

## 2021-06-05 DIAGNOSIS — Z79899 Other long term (current) drug therapy: Secondary | ICD-10-CM | POA: Diagnosis not present

## 2021-06-05 DIAGNOSIS — I2510 Atherosclerotic heart disease of native coronary artery without angina pectoris: Secondary | ICD-10-CM | POA: Insufficient documentation

## 2021-06-05 DIAGNOSIS — Z720 Tobacco use: Secondary | ICD-10-CM | POA: Diagnosis not present

## 2021-06-05 NOTE — Patient Instructions (Addendum)
Continue weighing daily and call for an overnight weight gain of 3 pounds or more or a weekly weight gain of more than 5 pounds.  ° ° ° °Call us in the future if you need us for anything °

## 2021-06-05 NOTE — Progress Notes (Signed)
Peter [name] ID: Peter [name], male    DOB: 1940/05/04, 81 y.o.   MRN: [id number]  HPI  Peter [name] is a 81 y/o male with a history of asthma, CAD, hyperlipidemia, HTN, hypokalemia, MI, NSTEMI, multiple stents, current tobacco use and chronic heart failure.   Echo report from 05/29/21 reviewed and showed an EF of 40-45% along with mild LAE and mild/moderate Peter/TR. Echo report from 04/04/21 reviewed and showed an EF of 50-55% along with mild LVH and mild Peter. Echo report done 10/01/16 reviewed and shows an EF of 35% along with moderate Peter/TR. Reviewed echo report on 08/08/16 which showed an EF of 35-40% along with mild AR and moderate Peter/TR. EF has declined from a previous echo done 12/15/11.   Cardiac catheterization done 08/10/16 showed one vessel CAD with high-grade in stent restenosis in the proximal LAD. DES successfully placed in proximal LAD.  Admitted 05/28/21 due to chest pain and dyspnea. Troponin elevation noted. Cardiology and GI consults obtained. Given 2 units of blood. Unable to cath due to CKD. Upper GI study negative for ulcers. Discharged after 7 days. Admitted 04/27/21 due to shortness of breath. Previously furosemide was stopped due to AKI. Cardiology, nephrology & pulmonology consults obtained. Spironolactone & ACE-i held due to CKD. Needed oxygen for home use. PT/OT evaluations done. Elevated troponin thought to be due to demand ischemia. Discharged after 5 days.   Peter [name] presents today for a follow-up visit today although hasn't been seen since March 2019. Peter [name] presents with a chief complaint of moderate shortness of breath with minimal exertion. Peter [name] says this is chronic in nature although has worsened over the last few months where Peter [name] now has to wear oxygen. Peter [name] has associated decreased appetite, fatigue, head congestion, wheezing, palpitations, light-headedness, weakness, difficulty sleeping and pedal edema along with this. Peter [name] denies any abdominal distention, chest pain or weight gain.   Took his  first dose of clonidine yesterday and says that Peter [name] felt a burning sensation in his chest like when Peter [name] received contrast dye. Peter [name] hasn't taken anymore since. Peter [name] also wants to see about his celexa being changed to something else because Peter [name] feels like this caused his bleeding.   Clopidogrel, furosemide and spironolactone are still on hold.   Past Medical History:  Diagnosis Date   Anginal pain ([place])    Asthma    Bladder infection, acute 03/2011   "had a whole lot of bleeding from this"   CHF (congestive heart failure) ([place])    Coronary artery disease    High cholesterol    Hypertension    Macular degeneration 12/14/2011   "had it in my right; getting shots now in my left"   Myocardial infarction [facility]) 1996   NSTEMI (non-ST elevated myocardial infarction) ([place]) 12/14/2011   Shortness of breath 12/14/2011   "@ rest, lying down, w/exertion"   Past Surgical History:  Procedure Laterality Date   [place]; ~ 2003; ~ 2008   "total of 3"   CORONARY STENT INTERVENTION N/A 08/10/2016   Procedure: Coronary Stent Intervention;  Surgeon: [name], MD;  Location: [place] CV LAB;  Service: Cardiovascular;  Laterality: N/A;   HERNIA REPAIR  ~ 2001   "abdominal w/mesh implanted"   LEFT HEART CATH AND CORONARY ANGIOGRAPHY N/A 08/10/2016   Procedure: Left Heart Cath and Coronary Angiography;  Surgeon: [name], MD;  Location: [place] CV LAB;  Service: Cardiovascular;  Laterality: N/A;   LEFT HEART CATHETERIZATION WITH CORONARY  ANGIOGRAM N/A 12/16/2011   Procedure: LEFT HEART CATHETERIZATION WITH CORONARY ANGIOGRAM;  Surgeon: Minus Breeding, MD;  Location[facility] C[facility] LAB;  Service: Cardiovascular;  Laterality: N/A;   PERCUTANEOUS CORONARY STENT INTERVENTION (PCI-S) N/A 12/17/2011   Procedure: PERCUTANEOUS CORONARY STENT INTERVENTION (PCI-S);  Surgeon: Sherren Mocha, MD;  Location: [facility] CATH LAB;  Service: Cardiovascular;  Laterality: N/A;    TONSILLECTOMY     "I was a kid"   Family History  Family history unknown: Yes   Social History   Tobacco Use   Smoking status: Former    Packs/day: 0.50    Years: 0.50    Pack years: 0.25    Types: Cigarettes   Smokeless tobacco: Never   Tobacco comments:    11/23/16 Still not ready to quit smoking.  Substance Use Topics   Alcohol use: Yes    Alcohol/week: 1.0 standard drink    Types: 1 Cans of beer per week    Comment: 12/14/2011 "if my kidneys are sore, I drink 1 beer/day; if not sore; no beer; occasionally drink socially; ave 1 beer/wk maybe"   Allergies  Allergen Reactions   Iodinated Contrast Media Shortness Of Breath   Iodinated Glycerol  [Glycerol, Iodinated] Shortness Of Breath   Prior to Admission medications   Medication Sig Start Date End Date Taking? Authorizing Provider  amLODipine (NORVASC) 10 MG tablet Take 1 tablet (10 mg total) by mouth daily. 06/03/21 09/01/21 Yes Enzo Bi, MD  atorvastatin (LIPITOR) 40 MG tablet Take 1 tablet (40 mg total) by mouth daily at 6 PM. 12/18/11  Yes Annita Brod, MD  calcitRIOL (ROCALTROL) 0.25 MCG capsule Take 0.25 mcg by mouth daily.   Yes [provider]  carvedilol (COREG) 6.25 MG tablet Take 1 tablet (6.25 mg total) by mouth 2 (two) times daily with a meal. 06/03/21 09/01/21 Yes Enzo Bi, MD  cetirizine (ZYRTEC) 10 MG tablet Take 10 mg by mouth.   Yes [provider]  citalopram (CELEXA) 20 MG tablet Take 20 mg by mouth daily. 05/22/21  Yes [provider]  clobetasol cream (TEMOVATE) 8.09 % Apply 1 application topically 2 (two) times daily as needed (psoriasis).   Yes [provider]  cyanocobalamin 1000 MCG tablet Take 1,000 mcg by mouth daily.    Yes [provider]  finasteride (PROSCAR) 5 MG tablet Take 1 tablet (5 mg total) by mouth daily. 06/03/21 09/01/21 Yes Enzo Bi, MD  furosemide (LASIX) 40 MG tablet Hold until outpatient followup with cardiology. 06/03/21  Yes Enzo Bi, MD   GLUCOSAMINE-CHONDROITIN-VIT C PO Take 1 tablet by mouth daily.   Yes [provider]  hydrALAZINE (APRESOLINE) 100 MG tablet Take 1 tablet (100 mg total) by mouth 3 (three) times daily. 06/03/21 09/01/21 Yes Enzo Bi, MD  isosorbide mononitrate (IMDUR) 30 MG 24 hr tablet Take 1 tablet (30 mg total) by mouth daily. 06/03/21 09/01/21 Yes Enzo Bi, MD  omeprazole (PRILOSEC) 20 MG capsule Take 20 mg by mouth in the morning.   Yes [provider]  spironolactone (ALDACTONE) 25 MG tablet Hold until followup with your outpatient doctor due to worsening kidney function. 06/03/21  Yes Enzo Bi, MD  tamsulosin (FLOMAX) 0.4 MG CAPS capsule Take 1 capsule (0.4 mg total) by mouth daily. 06/03/21 09/01/21 Yes Enzo Bi, MD  umeclidinium bromide (INCRUSE ELLIPTA) 62.5 MCG/ACT AEPB Inhale 1 puff into the lungs daily. 06/03/21 09/01/21 Yes Enzo Bi, MD  cloNIDine (CATAPRES) 0.1 MG tablet Take 1 tablet (0.1 mg total) by mouth  2 (two) times daily. Peter [name] not taking: Reported on 06/05/2021 06/03/21 09/01/21  Enzo Bi, MD  clopidogrel (PLAVIX) 75 MG tablet Hold until outpatient followup with cardiology due to worsening anemia. Peter [name] not taking: Reported on 06/05/2021 06/03/21   Enzo Bi, MD    Review of Systems  Constitutional:  Positive for appetite change (decreased) and fatigue.  HENT:  Positive for congestion. Negative for rhinorrhea and sore throat.   Eyes: Negative.   Respiratory:  Positive for shortness of breath (with minimal exertion) and wheezing (at times). Negative for cough and chest tightness.   Cardiovascular:  Positive for palpitations (at times) and leg swelling. Negative for chest pain.  Gastrointestinal:  Negative for abdominal distention and abdominal pain.  Endocrine: Negative.   Genitourinary: Negative.   Musculoskeletal:  Negative for back pain and neck pain.  Skin: Negative.   Allergic/Immunologic: Negative.   Neurological:  Positive for weakness (this morning) and  light-headedness (when standing up). Negative for dizziness.  Hematological:  Negative for adenopathy. Bruises/bleeds easily.  Psychiatric/Behavioral:  Positive for sleep disturbance (waking every few hours; wearing oxygen at night). Negative for dysphoric mood. The Peter [name] is not nervous/anxious.    Vitals:   06/05/21 1604  BP: (!) 148/58  Pulse: 94  Resp: 16  SpO2: 99%  Weight: 202 lb (91.6 kg)  Height: 5\' 11"  (1.803 m)   Wt Readings from Last 3 Encounters:  06/05/21 202 lb (91.6 kg)  05/28/21 204 lb (92.5 kg)  05/02/21 211 lb 12.8 oz (96.1 kg)   Lab Results  Component Value Date   CREATININE 3.58 (H) 06/04/2021   CREATININE 3.58 (H) 06/03/2021   CREATININE 3.70 (H) 06/02/2021   Physical Exam Vitals and nursing note reviewed. Exam conducted with a chaperone present (family member).  Constitutional:      Appearance: Peter [name] is well-developed.  HENT:     Head: Normocephalic and atraumatic.  Neck:     Vascular: No JVD.  Cardiovascular:     Rate and Rhythm: Normal rate and regular rhythm.  Pulmonary:     Effort: Pulmonary effort is normal.     Breath sounds: No wheezing or rales.  Abdominal:     General: There is no distension.     Palpations: Abdomen is soft.     Tenderness: There is no abdominal tenderness.  Musculoskeletal:        General: No tenderness.     Cervical back: Normal range of motion and neck supple.     Right lower leg: Edema (2+ pitting) present.     Left lower leg: Edema (2+ pitting) present.  Skin:    General: Skin is warm and dry.  Neurological:     Mental Status: Peter [name] is alert and oriented to person, place, and time.  Psychiatric:        Behavior: Behavior normal.        Thought Content: Thought content normal.   Assessment & Plan:  1: Chronic heart failure with mildly reduced ejection fraction- - NYHA class III - euvolemic today - weighing daily. Reminded to call for an overnight weight gain of >2 pounds or a weekly weight gain of >5 pounds -  not adding salt and has been reading food labels. Reviewed the importance of closely following a 2000mg  sodium diet & a low sodium cookbook was provided - on GDMT of carvedilol - current renal disease does not allow for SGLT2, spironolactone or entresto - furosemide remains on hold - saw cardiologist (Dr. Clayborn Bigness) 05/22/21; returns 06/26/21 -  BNP 05/28/21 was 2842.5  2: HTN with CKD- - BP mildly elevated (148/58) - saw PCP ([place]) 05/16/21; returns tomorrow to see Dr Ginette Pitman and will get lab work done - saw nephrology ([place]) 04/21/21 - BMP on 06/04/21 reviewed and showed sodium 141, potassium 3.4, creatinine 3.58 and GFR 16  3: COPD- - to see pulmonology Lanney Gins) 06/13/21 - wearing oxygen at 3L mostly around the clock; says that his insurance will not cover the oxygen because Peter [name] did not dip <90%  4: Lymphedema- - stage 2 - limited in his ability to exercise due to symptoms - has compression socks but doesn't consistently wear them because Peter [name] says the edema is better; encouraged to wear them daily with removal at bedtime - elevating his legs during the day   Medication list reviewed.   Peter [name] prefers to not return at this time as Peter [name] has numerous other providers. Advised him that Peter [name] could call back at anytime to make another appointment.

## 2021-06-06 DIAGNOSIS — D631 Anemia in chronic kidney disease: Secondary | ICD-10-CM | POA: Diagnosis not present

## 2021-06-06 DIAGNOSIS — I2510 Atherosclerotic heart disease of native coronary artery without angina pectoris: Secondary | ICD-10-CM | POA: Diagnosis not present

## 2021-06-06 DIAGNOSIS — N184 Chronic kidney disease, stage 4 (severe): Secondary | ICD-10-CM | POA: Diagnosis not present

## 2021-06-06 DIAGNOSIS — I5022 Chronic systolic (congestive) heart failure: Secondary | ICD-10-CM | POA: Diagnosis not present

## 2021-06-06 DIAGNOSIS — N189 Chronic kidney disease, unspecified: Secondary | ICD-10-CM | POA: Diagnosis not present

## 2021-06-06 DIAGNOSIS — Z09 Encounter for follow-up examination after completed treatment for conditions other than malignant neoplasm: Secondary | ICD-10-CM | POA: Diagnosis not present

## 2021-06-06 DIAGNOSIS — J449 Chronic obstructive pulmonary disease, unspecified: Secondary | ICD-10-CM | POA: Diagnosis not present

## 2021-06-10 ENCOUNTER — Other Ambulatory Visit: Payer: Self-pay

## 2021-06-10 ENCOUNTER — Emergency Department: Payer: PPO

## 2021-06-10 ENCOUNTER — Inpatient Hospital Stay
Admission: EM | Admit: 2021-06-10 | Discharge: 2021-06-24 | DRG: 291 | Disposition: A | Discharge status: Nursing Home (Includes ICF) | Payer: PPO | Attending: Internal Medicine | Admitting: Internal Medicine

## 2021-06-10 DIAGNOSIS — E7800 Pure hypercholesterolemia, unspecified: Secondary | ICD-10-CM | POA: Diagnosis present

## 2021-06-10 DIAGNOSIS — I517 Cardiomegaly: Secondary | ICD-10-CM | POA: Diagnosis not present

## 2021-06-10 DIAGNOSIS — I214 Non-ST elevation (NSTEMI) myocardial infarction: Secondary | ICD-10-CM | POA: Diagnosis not present

## 2021-06-10 DIAGNOSIS — I252 Old myocardial infarction: Secondary | ICD-10-CM | POA: Diagnosis not present

## 2021-06-10 DIAGNOSIS — N186 End stage renal disease: Secondary | ICD-10-CM | POA: Diagnosis not present

## 2021-06-10 DIAGNOSIS — E1122 Type 2 diabetes mellitus with diabetic chronic kidney disease: Secondary | ICD-10-CM | POA: Diagnosis present

## 2021-06-10 DIAGNOSIS — D631 Anemia in chronic kidney disease: Secondary | ICD-10-CM | POA: Diagnosis present

## 2021-06-10 DIAGNOSIS — I132 Hypertensive heart and chronic kidney disease with heart failure and with stage 5 chronic kidney disease, or end stage renal disease: Secondary | ICD-10-CM | POA: Diagnosis not present

## 2021-06-10 DIAGNOSIS — H3530 Unspecified macular degeneration: Secondary | ICD-10-CM | POA: Diagnosis present

## 2021-06-10 DIAGNOSIS — I5040 Unspecified combined systolic (congestive) and diastolic (congestive) heart failure: Secondary | ICD-10-CM | POA: Diagnosis not present

## 2021-06-10 DIAGNOSIS — N179 Acute kidney failure, unspecified: Secondary | ICD-10-CM | POA: Diagnosis present

## 2021-06-10 DIAGNOSIS — R0602 Shortness of breath: Secondary | ICD-10-CM | POA: Diagnosis not present

## 2021-06-10 DIAGNOSIS — Z955 Presence of coronary angioplasty implant and graft: Secondary | ICD-10-CM

## 2021-06-10 DIAGNOSIS — D649 Anemia, unspecified: Secondary | ICD-10-CM | POA: Diagnosis not present

## 2021-06-10 DIAGNOSIS — Z91041 Radiographic dye allergy status: Secondary | ICD-10-CM | POA: Diagnosis not present

## 2021-06-10 DIAGNOSIS — Z95810 Presence of automatic (implantable) cardiac defibrillator: Secondary | ICD-10-CM | POA: Diagnosis not present

## 2021-06-10 DIAGNOSIS — I5022 Chronic systolic (congestive) heart failure: Secondary | ICD-10-CM | POA: Diagnosis not present

## 2021-06-10 DIAGNOSIS — N184 Chronic kidney disease, stage 4 (severe): Secondary | ICD-10-CM | POA: Diagnosis not present

## 2021-06-10 DIAGNOSIS — I160 Hypertensive urgency: Secondary | ICD-10-CM | POA: Diagnosis present

## 2021-06-10 DIAGNOSIS — D696 Thrombocytopenia, unspecified: Secondary | ICD-10-CM | POA: Diagnosis not present

## 2021-06-10 DIAGNOSIS — N189 Chronic kidney disease, unspecified: Secondary | ICD-10-CM | POA: Diagnosis not present

## 2021-06-10 DIAGNOSIS — E785 Hyperlipidemia, unspecified: Secondary | ICD-10-CM | POA: Diagnosis present

## 2021-06-10 DIAGNOSIS — I5021 Acute systolic (congestive) heart failure: Secondary | ICD-10-CM | POA: Diagnosis not present

## 2021-06-10 DIAGNOSIS — I10 Essential (primary) hypertension: Secondary | ICD-10-CM | POA: Diagnosis present

## 2021-06-10 DIAGNOSIS — I255 Ischemic cardiomyopathy: Secondary | ICD-10-CM | POA: Diagnosis present

## 2021-06-10 DIAGNOSIS — N400 Enlarged prostate without lower urinary tract symptoms: Secondary | ICD-10-CM | POA: Diagnosis not present

## 2021-06-10 DIAGNOSIS — Z20822 Contact with and (suspected) exposure to covid-19: Secondary | ICD-10-CM | POA: Diagnosis not present

## 2021-06-10 DIAGNOSIS — F32A Depression, unspecified: Secondary | ICD-10-CM | POA: Diagnosis present

## 2021-06-10 DIAGNOSIS — I120 Hypertensive chronic kidney disease with stage 5 chronic kidney disease or end stage renal disease: Secondary | ICD-10-CM | POA: Diagnosis not present

## 2021-06-10 DIAGNOSIS — N2581 Secondary hyperparathyroidism of renal origin: Secondary | ICD-10-CM | POA: Diagnosis not present

## 2021-06-10 DIAGNOSIS — D638 Anemia in other chronic diseases classified elsewhere: Secondary | ICD-10-CM | POA: Diagnosis not present

## 2021-06-10 DIAGNOSIS — J9611 Chronic respiratory failure with hypoxia: Secondary | ICD-10-CM | POA: Diagnosis present

## 2021-06-10 DIAGNOSIS — I509 Heart failure, unspecified: Secondary | ICD-10-CM

## 2021-06-10 DIAGNOSIS — J45909 Unspecified asthma, uncomplicated: Secondary | ICD-10-CM | POA: Diagnosis present

## 2021-06-10 DIAGNOSIS — R748 Abnormal levels of other serum enzymes: Secondary | ICD-10-CM | POA: Diagnosis not present

## 2021-06-10 DIAGNOSIS — K219 Gastro-esophageal reflux disease without esophagitis: Secondary | ICD-10-CM | POA: Diagnosis not present

## 2021-06-10 DIAGNOSIS — I5023 Acute on chronic systolic (congestive) heart failure: Secondary | ICD-10-CM | POA: Diagnosis present

## 2021-06-10 DIAGNOSIS — R7989 Other specified abnormal findings of blood chemistry: Secondary | ICD-10-CM | POA: Diagnosis not present

## 2021-06-10 DIAGNOSIS — I248 Other forms of acute ischemic heart disease: Secondary | ICD-10-CM | POA: Diagnosis not present

## 2021-06-10 DIAGNOSIS — I130 Hypertensive heart and chronic kidney disease with heart failure and stage 1 through stage 4 chronic kidney disease, or unspecified chronic kidney disease: Secondary | ICD-10-CM | POA: Diagnosis not present

## 2021-06-10 DIAGNOSIS — I110 Hypertensive heart disease with heart failure: Secondary | ICD-10-CM | POA: Diagnosis not present

## 2021-06-10 DIAGNOSIS — R778 Other specified abnormalities of plasma proteins: Secondary | ICD-10-CM | POA: Diagnosis not present

## 2021-06-10 DIAGNOSIS — R0689 Other abnormalities of breathing: Secondary | ICD-10-CM | POA: Diagnosis not present

## 2021-06-10 DIAGNOSIS — E8721 Acute metabolic acidosis: Secondary | ICD-10-CM | POA: Diagnosis not present

## 2021-06-10 DIAGNOSIS — Z79899 Other long term (current) drug therapy: Secondary | ICD-10-CM

## 2021-06-10 DIAGNOSIS — Z7902 Long term (current) use of antithrombotics/antiplatelets: Secondary | ICD-10-CM

## 2021-06-10 DIAGNOSIS — I2510 Atherosclerotic heart disease of native coronary artery without angina pectoris: Secondary | ICD-10-CM | POA: Diagnosis not present

## 2021-06-10 DIAGNOSIS — Z87891 Personal history of nicotine dependence: Secondary | ICD-10-CM | POA: Diagnosis not present

## 2021-06-10 DIAGNOSIS — I129 Hypertensive chronic kidney disease with stage 1 through stage 4 chronic kidney disease, or unspecified chronic kidney disease: Secondary | ICD-10-CM | POA: Diagnosis not present

## 2021-06-10 DIAGNOSIS — Z992 Dependence on renal dialysis: Secondary | ICD-10-CM | POA: Diagnosis not present

## 2021-06-10 DIAGNOSIS — N289 Disorder of kidney and ureter, unspecified: Secondary | ICD-10-CM | POA: Diagnosis not present

## 2021-06-10 DIAGNOSIS — D72829 Elevated white blood cell count, unspecified: Secondary | ICD-10-CM | POA: Diagnosis not present

## 2021-06-10 DIAGNOSIS — I428 Other cardiomyopathies: Secondary | ICD-10-CM | POA: Diagnosis not present

## 2021-06-10 DIAGNOSIS — J449 Chronic obstructive pulmonary disease, unspecified: Secondary | ICD-10-CM | POA: Diagnosis not present

## 2021-06-10 LAB — BASIC METABOLIC PANEL
Anion gap: 9 (ref 5–15)
BUN: 62 mg/dL — ABNORMAL HIGH (ref 8–23)
CO2: 23 mmol/L (ref 22–32)
Calcium: 8.3 mg/dL — ABNORMAL LOW (ref 8.9–10.3)
Chloride: 107 mmol/L (ref 98–111)
Creatinine, Ser: 3.76 mg/dL — ABNORMAL HIGH (ref 0.61–1.24)
GFR, Estimated: 15 mL/min — ABNORMAL LOW (ref >60)
Glucose, Bld: 125 mg/dL — ABNORMAL HIGH (ref 70–99)
Potassium: 3.8 mmol/L (ref 3.5–5.1)
Sodium: 139 mmol/L (ref 135–145)

## 2021-06-10 LAB — CBC WITH DIFFERENTIAL/PLATELET
Abs Immature Granulocytes: 0.05 10*3/uL (ref 0.00–0.07)
Basophils Absolute: 0 10*3/uL (ref 0.0–0.1)
Basophils Relative: 0 %
Eosinophils Absolute: 0 10*3/uL (ref 0.0–0.5)
Eosinophils Relative: 0 %
HCT: 31.3 % — ABNORMAL LOW (ref 39.0–52.0)
Hemoglobin: 10 g/dL — ABNORMAL LOW (ref 13.0–17.0)
Immature Granulocytes: 0 %
Lymphocytes Relative: 3 %
Lymphs Abs: 0.4 10*3/uL — ABNORMAL LOW (ref 0.7–4.0)
MCH: 29.5 pg (ref 26.0–34.0)
MCHC: 31.9 g/dL (ref 30.0–36.0)
MCV: 92.3 fL (ref 80.0–100.0)
Monocytes Absolute: 0.9 10*3/uL (ref 0.1–1.0)
Monocytes Relative: 6 %
Neutro Abs: 12.4 10*3/uL — ABNORMAL HIGH (ref 1.7–7.7)
Neutrophils Relative %: 91 %
Platelets: 142 10*3/uL — ABNORMAL LOW (ref 150–400)
RBC: 3.39 MIL/uL — ABNORMAL LOW (ref 4.22–5.81)
RDW: 13.7 % (ref 11.5–15.5)
WBC: 13.8 10*3/uL — ABNORMAL HIGH (ref 4.0–10.5)
nRBC: 0 % (ref 0.0–0.2)

## 2021-06-10 LAB — BRAIN NATRIURETIC PEPTIDE: B Natriuretic Peptide: 1783.5 pg/mL — ABNORMAL HIGH (ref 0.0–100.0)

## 2021-06-10 LAB — RESP PANEL BY RT-PCR (FLU A&B, COVID) ARPGX2
Influenza A by PCR: NEGATIVE
Influenza B by PCR: NEGATIVE
SARS Coronavirus 2 by RT PCR: NEGATIVE

## 2021-06-10 LAB — TROPONIN I (HIGH SENSITIVITY)
Troponin I (High Sensitivity): 386 ng/L (ref <18)
Troponin I (High Sensitivity): 525 ng/L (ref <18)
Troponin I (High Sensitivity): 858 ng/L (ref <18)
Troponin I (High Sensitivity): 2587 ng/L (ref <18)

## 2021-06-10 LAB — PROCALCITONIN: Procalcitonin: <0.1 ng/mL

## 2021-06-10 MED ORDER — LORATADINE 10 MG PO TABS
10.0000 mg | ORAL_TABLET | Freq: Every day | ORAL | Status: DC | PRN
Start: 2021-06-10 — End: 2021-06-24

## 2021-06-10 MED ORDER — AMLODIPINE BESYLATE 10 MG PO TABS
10.0000 mg | ORAL_TABLET | Freq: Every day | ORAL | Status: DC
  Administered 2021-06-10 – 2021-06-24 (×15): 10 mg via ORAL
  Filled 2021-06-10 (×3): qty 1
  Filled 2021-06-10: qty 2
  Filled 2021-06-10 (×11): qty 1

## 2021-06-10 MED ORDER — ACETAMINOPHEN 325 MG PO TABS: 650.0000 mg | ORAL_TABLET | Freq: Four times a day (QID) | ORAL | Status: DC | PRN

## 2021-06-10 MED ORDER — TAMSULOSIN HCL 0.4 MG PO CAPS
0.4000 mg | ORAL_CAPSULE | Freq: Every day | ORAL | Status: DC
  Administered 2021-06-10 – 2021-06-24 (×15): 0.4 mg via ORAL
  Filled 2021-06-10 (×15): qty 1

## 2021-06-10 MED ORDER — ASPIRIN EC 325 MG PO TBEC
325.0000 mg | DELAYED_RELEASE_TABLET | Freq: Every day | ORAL | Status: DC
  Administered 2021-06-10: 325 mg via ORAL
  Filled 2021-06-10: qty 1

## 2021-06-10 MED ORDER — UMECLIDINIUM BROMIDE 62.5 MCG/ACT IN AEPB
1.0000 | INHALATION_SPRAY | Freq: Every day | RESPIRATORY_TRACT | Status: DC
  Administered 2021-06-11 – 2021-06-24 (×13): 1 via RESPIRATORY_TRACT
  Filled 2021-06-10 (×2): qty 7

## 2021-06-10 MED ORDER — CARVEDILOL 6.25 MG PO TABS
6.2500 mg | ORAL_TABLET | Freq: Two times a day (BID) | ORAL | Status: DC
  Administered 2021-06-10 – 2021-06-23 (×23): 6.25 mg via ORAL
  Filled 2021-06-10 (×24): qty 1

## 2021-06-10 MED ORDER — ONDANSETRON HCL 4 MG/2ML IJ SOLN: 4.0000 mg | Freq: Three times a day (TID) | INTRAMUSCULAR | Status: DC | PRN

## 2021-06-10 MED ORDER — GLUCOSAMINE-CHONDROITIN-VIT C 2000-1200-60 MG/30ML PO LIQD
Freq: Every day | ORAL | Status: DC
Start: 2021-06-10 — End: 2021-06-10

## 2021-06-10 MED ORDER — DM-GUAIFENESIN ER 30-600 MG PO TB12
1.0000 | ORAL_TABLET | Freq: Two times a day (BID) | ORAL | Status: DC | PRN
Start: 2021-06-10 — End: 2021-06-24
  Administered 2021-06-17: 1 via ORAL
  Filled 2021-06-10: qty 1

## 2021-06-10 MED ORDER — PANTOPRAZOLE SODIUM 40 MG PO TBEC
40.0000 mg | DELAYED_RELEASE_TABLET | Freq: Every day | ORAL | Status: DC
Start: 2021-06-10 — End: 2021-06-24
  Administered 2021-06-10 – 2021-06-24 (×15): 40 mg via ORAL
  Filled 2021-06-10 (×16): qty 1

## 2021-06-10 MED ORDER — NITROGLYCERIN 0.4 MG SL SUBL: 0.4000 mg | SUBLINGUAL_TABLET | SUBLINGUAL | Status: DC | PRN

## 2021-06-10 MED ORDER — ALBUTEROL SULFATE HFA 108 (90 BASE) MCG/ACT IN AERS
2.0000 | INHALATION_SPRAY | RESPIRATORY_TRACT | Status: DC | PRN
Start: 2021-06-10 — End: 2021-06-10

## 2021-06-10 MED ORDER — VITAMIN B-12 1000 MCG PO TABS
1000.0000 ug | ORAL_TABLET | Freq: Every day | ORAL | Status: DC
  Administered 2021-06-10 – 2021-06-24 (×15): 1000 ug via ORAL
  Filled 2021-06-10 (×15): qty 1

## 2021-06-10 MED ORDER — FUROSEMIDE 10 MG/ML IJ SOLN
40.0000 mg | Freq: Once | INTRAMUSCULAR | Status: AC
  Administered 2021-06-10: 40 mg via INTRAVENOUS
  Filled 2021-06-10: qty 4

## 2021-06-10 MED ORDER — HYDRALAZINE HCL 50 MG PO TABS
100.0000 mg | ORAL_TABLET | Freq: Three times a day (TID) | ORAL | Status: DC
  Administered 2021-06-10 – 2021-06-24 (×37): 100 mg via ORAL
  Filled 2021-06-10 (×37): qty 2

## 2021-06-10 MED ORDER — CITALOPRAM HYDROBROMIDE 20 MG PO TABS
20.0000 mg | ORAL_TABLET | Freq: Every day | ORAL | Status: DC
  Administered 2021-06-11 – 2021-06-24 (×14): 20 mg via ORAL
  Filled 2021-06-10 (×15): qty 1

## 2021-06-10 MED ORDER — MORPHINE SULFATE (PF) 2 MG/ML IV SOLN: 0.5000 mg | INTRAVENOUS | Status: DC | PRN

## 2021-06-10 MED ORDER — SALINE SPRAY 0.65 % NA SOLN
1.0000 | NASAL | Status: DC | PRN
  Filled 2021-06-10: qty 44

## 2021-06-10 MED ORDER — ATORVASTATIN CALCIUM 20 MG PO TABS
40.0000 mg | ORAL_TABLET | Freq: Every day | ORAL | Status: DC
  Administered 2021-06-11 – 2021-06-23 (×13): 40 mg via ORAL
  Filled 2021-06-10 (×13): qty 2

## 2021-06-10 MED ORDER — HYDRALAZINE HCL 20 MG/ML IJ SOLN: 5.0000 mg | INTRAMUSCULAR | Status: DC | PRN

## 2021-06-10 MED ORDER — ALBUTEROL SULFATE (2.5 MG/3ML) 0.083% IN NEBU: 3.0000 mL | INHALATION_SOLUTION | RESPIRATORY_TRACT | Status: DC | PRN

## 2021-06-10 MED ORDER — ISOSORBIDE MONONITRATE ER 30 MG PO TB24
30.0000 mg | ORAL_TABLET | Freq: Every day | ORAL | Status: DC
Start: 2021-06-10 — End: 2021-06-12
  Administered 2021-06-10 – 2021-06-12 (×3): 30 mg via ORAL
  Filled 2021-06-10 (×3): qty 1

## 2021-06-10 MED ORDER — FUROSEMIDE 40 MG PO TABS
40.0000 mg | ORAL_TABLET | Freq: Every day | ORAL | Status: DC
  Administered 2021-06-11 – 2021-06-12 (×2): 40 mg via ORAL
  Filled 2021-06-10 (×2): qty 1

## 2021-06-10 MED ORDER — FINASTERIDE 5 MG PO TABS
5.0000 mg | ORAL_TABLET | Freq: Every day | ORAL | Status: DC
  Administered 2021-06-10 – 2021-06-24 (×15): 5 mg via ORAL
  Filled 2021-06-10 (×15): qty 1

## 2021-06-10 MED ORDER — ASPIRIN EC 81 MG PO TBEC
81.0000 mg | DELAYED_RELEASE_TABLET | Freq: Every day | ORAL | Status: DC
Start: 2021-06-11 — End: 2021-06-24
  Administered 2021-06-11 – 2021-06-24 (×14): 81 mg via ORAL
  Filled 2021-06-10 (×14): qty 1

## 2021-06-10 MED ORDER — CALCITRIOL 0.25 MCG PO CAPS
0.2500 ug | ORAL_CAPSULE | Freq: Every day | ORAL | Status: DC
  Administered 2021-06-10 – 2021-06-24 (×15): 0.25 ug via ORAL
  Filled 2021-06-10 (×15): qty 1

## 2021-06-10 NOTE — Progress Notes (Signed)
PHARMACIST - PHYSICIAN ORDER COMMUNICATION  CONCERNING: P&T Medication Policy on Herbal Medications  DESCRIPTION:  This patients order for:  Glucosamine-Chondroitin-Vit C 2000-1200-60 MG/30ML LIQD  has been noted.  This product(s) is classified as an herbal or natural product. Due to a lack of definitive safety studies or FDA approval, nonstandard manufacturing practices, plus the potential risk of unknown drug-drug interactions while on inpatient medications, the Pharmacy and Therapeutics Committee does not permit the use of herbal or natural products of this type within [facility].   ACTION TAKEN: The pharmacy department is unable to verify this order at this time.  Please reevaluate patients clinical condition at discharge and address if the herbal or natural product(s) should be resumed at that time.  Pernell Dupre, PharmD, BCPS Clinical Pharmacist 06/10/2021 11:07 AM

## 2021-06-10 NOTE — H&P (Signed)
History and Physical    THI SISEMORE DPO:[id number] DOB: 1940-05-15 DOA: 06/10/2021  Referring MD/NP/PA:   PCP: Tracie Harrier, MD   Patient coming from:  The patient is coming from home.    Chief Complaint: SOB  HPI: Peter [name] is a 81 y.o. 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  Patient was recently hospitalized from 2/15 - 2/22 due to chest pain and non-STEMI.  He also had black stool bowel movement, GI was consulted, EGD was done, which showed gastritis.  Patient's Plavix is on hold.  Since last night, he started having shortness of breath, which has been progressively worsening.  Patient has cough with clear mucus production.  Denies chest pain.  He has chills, but no fever.  He has nausea, no vomiting, diarrhea or abdominal pain.  No symptoms of UTI.  Denies rectal bleeding or dark stool today.  He states that he quit smoking more than 2 months ago.  Data Reviewed and ED Course: pt was found to have troponin 386, BNP 1783, negative COVID PCR, stable renal function, temperature 99, blood pressure 192/77, 162/68, heart rate 72, RR 19, oxygen saturation 96% on 2 L oxygen.  Chest x-ray negative.  Patient is admitted to PCU as inpatient.  EKG: I have personally reviewed.  Sinus rhythm, QTc 493, ventricular bigeminy, poor R wave progression, LAD, difficult to find pacemaker marker.   Review of Systems:   General: no fevers, chills, no body weight gain, has poor appetite, has fatigue HEENT: no blurry vision, hearing changes or sore throat Respiratory: has dyspnea, coughing, no wheezing CV: no chest pain, no palpitations GI: has nausea, no vomiting, abdominal pain, diarrhea, constipation GU: no dysuria,  burning on urination, increased urinary frequency, hematuria  Ext: has leg edema Neuro: no unilateral weakness, numbness, or tingling, no vision change or hearing loss Skin: no rash, no skin tear. MSK: No muscle spasm, no deformity, no limitation of range of movement in spin Heme: No easy bruising.  Travel history: No recent long distant travel.   Allergy:  Allergies  Allergen Reactions   Iodinated Contrast Media Shortness Of Breath   Iodinated Glycerol  [Glycerol, Iodinated] Shortness Of Breath    Past Medical History:  Diagnosis Date   Anginal pain ([place])    Asthma    Bladder infection, acute 03/2011   "had a whole lot of bleeding from this"   CHF (congestive heart failure) ([place])    Coronary artery disease    High cholesterol    Hypertension    Macular degeneration 12/14/2011   "had it in my right; getting shots now in my left"   Myocardial infarction [facility]) 1996   NSTEMI (non-ST elevated myocardial infarction) ([place]) 12/14/2011   Shortness of breath 12/14/2011   "@ rest, lying down, w/exertion"    Past Surgical History:  Procedure Laterality Date   [place]; ~ 2003; ~ 2008   "total of 3"   CORONARY STENT INTERVENTION N/A 08/10/2016   Procedure: Coronary Stent Intervention;  Surgeon: [name], MD;  Location: [place] [facility];  Service: Cardiovascular;  Laterality: N/A;   HERNIA REPAIR  ~ 2001   "abdominal w/mesh implanted"   LEFT HEART CATH AND CORONARY ANGIOGRAPHY N/A 08/10/2016   Procedure: Left Heart  Cath and Coronary Angiography;  Surgeon: [name], MD;  Location: [place] [facility];  Service: Cardiovascular;  Laterality: N/A;   LEFT HEART CATHETERIZATION WITH CORONARY ANGIOGRAM N/A 12/16/2011   Procedure: LEFT HEART CATHETERIZATION WITH CORONARY ANGIOGRAM;  Surgeon: Minus Breeding, MD;  Location[facility] CATH LAB;  Service: Cardiovascular;  Laterality: N/A;   PERCUTANEOUS CORONARY STENT INTERVENTION (PCI-S) N/A  12/17/2011   Procedure: PERCUTANEOUS CORONARY STENT INTERVENTION (PCI-S);  Surgeon: Sherren Mocha, MD;  Location[facility] CATH LAB;  Service: Cardiovascular;  Laterality: N/A;   TONSILLECTOMY     "I was a kid"    Social History:  reports that he has quit smoking. His smoking use included cigarettes. He has a 0.25 pack-year smoking history. He has never used smokeless tobacco. He reports current alcohol use of about 1.0 standard drink per week. He reports that he does not use drugs.  Family History:  Family History  Family history unknown: Yes     Prior to Admission medications   Medication Sig Start Date End Date Taking? Authorizing Provider  amLODipine (NORVASC) 10 MG tablet Take 1 tablet (10 mg total) by mouth daily. 06/03/21 09/01/21  Enzo Bi, MD  atorvastatin (LIPITOR) 40 MG tablet Take 1 tablet (40 mg total) by mouth daily at 6 PM. 12/18/11   Annita Brod, MD  calcitRIOL (ROCALTROL) 0.25 MCG capsule Take 0.25 mcg by mouth daily.    [provider]  carvedilol (COREG) 6.25 MG tablet Take 1 tablet (6.25 mg total) by mouth 2 (two) times daily with a meal. 06/03/21 09/01/21  Enzo Bi, MD  cetirizine (ZYRTEC) 10 MG tablet Take 10 mg by mouth.    [provider]  citalopram (CELEXA) 20 MG tablet Take 20 mg by mouth daily. 05/22/21   [provider]  clobetasol cream (TEMOVATE) 8.58 % Apply 1 application topically 2 (two) times daily as needed (psoriasis).    [provider]  cloNIDine (CATAPRES) 0.1 MG tablet Take 1 tablet (0.1 mg total) by mouth 2 (two) times daily. Patient not taking: Reported on 06/05/2021 06/03/21 09/01/21  Enzo Bi, MD  clopidogrel (PLAVIX) 75 MG tablet Hold until outpatient followup with cardiology due to worsening anemia. Patient not taking: Reported on 06/05/2021 06/03/21   Enzo Bi, MD  cyanocobalamin 1000 MCG tablet Take 1,000 mcg by mouth daily.     [provider]  finasteride (PROSCAR) 5 MG tablet Take 1 tablet (5 mg total)  by mouth daily. 06/03/21 09/01/21  Enzo Bi, MD  furosemide (LASIX) 40 MG tablet Hold until outpatient followup with cardiology. Patient not taking: Reported on 06/05/2021 06/03/21   Enzo Bi, MD  GLUCOSAMINE-CHONDROITIN-VIT C PO Take 1 tablet by mouth daily.    [provider]  hydrALAZINE (APRESOLINE) 100 MG tablet Take 1 tablet (100 mg total) by mouth 3 (three) times daily. 06/03/21 09/01/21  Enzo Bi, MD  isosorbide mononitrate (IMDUR) 30 MG 24 hr tablet Take 1 tablet (30 mg total) by mouth daily. 06/03/21 09/01/21  Enzo Bi, MD  omeprazole (PRILOSEC) 20 MG capsule Take 20 mg by mouth in the morning.    [provider]  spironolactone (ALDACTONE) 25 MG tablet Hold until followup with your outpatient doctor due to worsening kidney function. Patient not taking: Reported on 06/05/2021 06/03/21   Enzo Bi, MD  tamsulosin (FLOMAX) 0.4 MG CAPS capsule Take 1 capsule (0.4 mg total) by mouth daily. 06/03/21 09/01/21  Enzo Bi, MD  umeclidinium bromide (INCRUSE ELLIPTA) 62.5 MCG/ACT AEPB Inhale 1 puff into  the lungs daily. 06/03/21 09/01/21  Enzo Bi, MD    Physical Exam: Vitals:   06/10/21 0800 06/10/21 0830 06/10/21 0900 06/10/21 1030  BP: (!) 170/69 (!) 162/68 (!) 161/64 (!) 171/70  Pulse: 69 70 70 78  Resp: 19 19 19 18   Temp:      TempSrc:      SpO2: 96% 96% 96% 98%  Weight:      Height:       General: Not in acute distress HEENT:       Eyes: PERRL, EOMI, no scleral icterus.       ENT: No discharge from the ears and nose, no pharynx injection, no tonsillar enlargement.        Neck: positive JVD, no bruit, no mass felt. Heme: No neck lymph node enlargement. Cardiac: S1/S2, RRR, No murmurs, No gallops or rubs. Respiratory: Has fine crackles bilaterally GI: Soft, nondistended, nontender, no rebound pain, no organomegaly, BS present. GU: No hematuria Ext: 2+ pitting leg edema bilaterally. 1+DP/PT pulse bilaterally. Musculoskeletal: No joint deformities, No joint redness or  warmth, no limitation of ROM in spin. Skin: No rashes.  Neuro: Alert, oriented X3, cranial nerves II-XII grossly intact, moves all extremities normally. Psych: Patient is not psychotic, no suicidal or hemocidal ideation.  Labs on Admission: I have personally reviewed following labs and imaging studies  CBC: Recent Labs  Lab 06/04/21 0555 06/10/21 0700  WBC 6.7 13.8*  NEUTROABS  --  12.4*  HGB 8.6* 10.0*  HCT 27.1* 31.3*  MCV 92.5 92.3  PLT 121* 161*   Basic Metabolic Panel: Recent Labs  Lab 06/04/21 0555 06/10/21 0700  NA 141 139  K 3.4* 3.8  CL 107 107  CO2 26 23  GLUCOSE 105* 125*  BUN 62* 62*  CREATININE 3.58* 3.76*  CALCIUM 8.0* 8.3*  MG 1.9  --    GFR: Estimated Creatinine Clearance: 17.8 mL/min (A) (by C-G formula based on SCr of 3.76 mg/dL (H)). Liver Function Tests: No results for input(s): AST, ALT, ALKPHOS, BILITOT, PROT, ALBUMIN in the last 168 hours. No results for input(s): LIPASE, AMYLASE in the last 168 hours. No results for input(s): AMMONIA in the last 168 hours. Coagulation Profile: No results for input(s): INR, PROTIME in the last 168 hours. Cardiac Enzymes: No results for input(s): CKTOTAL, CKMB, CKMBINDEX, TROPONINI in the last 168 hours. BNP (last 3 results) No results for input(s): PROBNP in the last 8760 hours. HbA1C: No results for input(s): HGBA1C in the last 72 hours. CBG: No results for input(s): GLUCAP in the last 168 hours. Lipid Profile: No results for input(s): CHOL, HDL, LDLCALC, TRIG, CHOLHDL, LDLDIRECT in the last 72 hours. Thyroid Function Tests: No results for input(s): TSH, T4TOTAL, FREET4, T3FREE, THYROIDAB in the last 72 hours. Anemia Panel: No results for input(s): VITAMINB12, FOLATE, FERRITIN, TIBC, IRON, RETICCTPCT in the last 72 hours. Urine analysis:    Component Value Date/Time   COLORURINE YELLOW (A) 04/22/2021 0226   APPEARANCEUR CLEAR (A) 04/22/2021 0226   APPEARANCEUR Clear 12/07/2019 1417   LABSPEC 1.012  04/22/2021 0226   PHURINE 5.0 04/22/2021 0226   GLUCOSEU 50 (A) 04/22/2021 0226   HGBUR NEGATIVE 04/22/2021 0226   BILIRUBINUR NEGATIVE 04/22/2021 0226   BILIRUBINUR Negative 12/07/2019 1417   KETONESUR NEGATIVE 04/22/2021 0226   PROTEINUR >=300 (A) 04/22/2021 0226   UROBILINOGEN 0.2 04/06/2011 0900   NITRITE NEGATIVE 04/22/2021 0226   LEUKOCYTESUR NEGATIVE 04/22/2021 0226   Sepsis Labs: @LABRCNTIP (procalcitonin:4,lacticidven:4) ) Recent Results (from the past 240  hour(s))  Resp Panel by RT-PCR (Flu A&B, Covid) Nasopharyngeal Swab     Status: None   Collection Time: 06/10/21  7:00 AM   Specimen: Nasopharyngeal Swab; Nasopharyngeal(NP) swabs in vial transport medium  Result Value Ref Range Status   SARS Coronavirus 2 by RT PCR NEGATIVE NEGATIVE Final    Comment: (NOTE) SARS-CoV-2 target nucleic acids are NOT DETECTED.  The SARS-CoV-2 RNA is generally detectable in upper respiratory specimens during the acute phase of infection. The lowest concentration of SARS-CoV-2 viral copies this assay can detect is 138 copies/mL. A negative result does not preclude SARS-Cov-2 infection and should not be used as the sole basis for treatment or other patient management decisions. A negative result may occur with  improper specimen collection/handling, submission of specimen other than nasopharyngeal swab, presence of viral mutation(s) within the areas targeted by this assay, and inadequate number of viral copies(<138 copies/mL). A negative result must be combined with clinical observations, patient history, and epidemiological information. The expected result is Negative.  Fact Sheet for Patients:  EntrepreneurPulse.com.au  Fact Sheet for Healthcare Providers:  IncredibleEmployment.be  This test is no t yet approved or cleared by the [place] FDA and  has been authorized for detection and/or diagnosis of SARS-CoV-2 by FDA under an Emergency Use  Authorization (EUA). This EUA will remain  in effect (meaning this test can be used) for the duration of the COVID-19 declaration under Section 564(b)(1) of the Act, 21 U.S.C.section 360bbb-3(b)(1), unless the authorization is terminated  or revoked sooner.       Influenza A by PCR NEGATIVE NEGATIVE Final   Influenza B by PCR NEGATIVE NEGATIVE Final    Comment: (NOTE) The Xpert Xpress SARS-CoV-2/FLU/RSV plus assay is intended as an aid in the diagnosis of influenza from Nasopharyngeal swab specimens and should not be used as a sole basis for treatment. Nasal washings and aspirates are unacceptable for Xpert Xpress SARS-CoV-2/FLU/RSV testing.  Fact Sheet for Patients: EntrepreneurPulse.com.au  Fact Sheet for Healthcare Providers: IncredibleEmployment.be  This test is not yet approved or cleared by the [place] FDA and has been authorized for detection and/or diagnosis of SARS-CoV-2 by FDA under an Emergency Use Authorization (EUA). This EUA will remain in effect (meaning this test can be used) for the duration of the COVID-19 declaration under Section 564(b)(1) of the Act, 21 U.S.C. section 360bbb-3(b)(1), unless the authorization is terminated or revoked.  Performed at [facility], [address], [place], [place] [address]      Radiological Exams on Admission: DG Chest Portable 1 View  Result Date: 06/10/2021 CLINICAL DATA:  81 year old male with shortness of breath and nausea. Former smoker. EXAM: PORTABLE CHEST 1 VIEW COMPARISON:  Portable chest 05/28/2021 and earlier. FINDINGS: Portable AP upright views at 0651 hours. Stable left chest cardiac AICD. Stable mild cardiomegaly and mediastinal contours. Visualized tracheal air column is within normal limits. Chronic increased pulmonary interstitial markings with mild basilar predominance on the right appears stable since January. No superimposed pneumothorax, pleural effusion,  or consolidation. No acute osseous abnormality identified. Negative visible bowel gas. IMPRESSION: Stable portable appearance of the chest since January, with suspected chronic interstitial changes. No acute cardiopulmonary abnormality. Electronically Signed   By: Genevie Ann M.D.   On: 06/10/2021 07:45      Assessment/Plan Principal Problem:   Acute on chronic systolic CHF (congestive heart failure) (HCC) Active Problems:   CAD (coronary artery disease)   HTN (hypertension)   Hyperlipidemia   BPH (benign prostatic hyperplasia)  NSTEMI (non-ST elevated myocardial infarction) (HCC)   Normocytic anemia   HLD (hyperlipidemia)   CKD (chronic kidney disease), stage IV (HCC)   Acute on chronic systolic CHF (congestive heart failure) ([place]): 2D echo on 05/29/2021 showed EF of 40-45%.  Patient has 2+ leg edema, positive JVD, crackles on auscultation, elevated BNP 1783, clinically consistent with a CHF exacerbation.  Patient has stage IV CKD, need to diurese carefully and monitor renal function closely.  Dr. Saralyn Pilar of cardiology is consulted  -Will admit to progressive unit as inpatient -Lasix 40 mg IV one dose now --> need to see renal fx in AM before giving more lasix -Daily weights -strict I/O's -Low salt diet -Fluid restriction -Obtain REDs Vest reading  CAD (coronary artery disease) and NSTEMI: pt had NSTEMI recently. His Trop is 386 today which is down from recent 1696. No CP -hold plavix due to recent GIB -pt was given 324 mg of ASA and continue 81 mg daily per card  -Lipitor, Imdur -prn NTG -trend trop -check A1c (pt had LDL 59 on 05/29/21)  HTN (hypertension): Refused clonidine because feeling weird ("sensation he has when he gets MRI contrast") -IV hydralazine as needed -Amlodipine, Coreg, hydralazine, imdur  Hyperlipidemia -Lipitor  BPH (benign prostatic hyperplasia) -Flomax, Proscar  Normocytic anemia: Hemoglobin 10.0, denies rectal bleeding or dark stool. -Follow-up  with CBC  CKD (chronic kidney disease), stage IV [facility]): Renal function stable.  Baseline creatinine 3.5-3.7 recently.  His creatinine is 3.76, BUN 62 -Follow-up by BMP      DVT ppx: SCD  Code Status: Full code per pt. His daughter is aware of this decision  Family Communication:    Yes, patient's lady friend at bed side.  I called his daughter  Disposition Plan:  Anticipate discharge back to previous environment  Consults called:  Dr. Saralyn Pilar of cardiology  Admission status and Level of care: Progressive:    as inpt     Severity of Illness:  The appropriate patient status for this patient is INPATIENT. Inpatient status is judged to be reasonable and necessary in order to provide the required intensity of service to ensure the patient's safety. The patient's presenting symptoms, physical exam findings, and initial radiographic and laboratory data in the context of their chronic comorbidities is felt to place them at high risk for further clinical deterioration. Furthermore, it is not anticipated that the patient will be medically stable for discharge from the hospital within 2 midnights of admission.   * I certify that at the point of admission it is my clinical judgment that the patient will require inpatient hospital care spanning beyond 2 midnights from the point of admission due to high intensity of service, high risk for further deterioration and high frequency of surveillance required.*       Date of Service 06/10/2021    Ivor [facility]   If 7PM-7AM, please contact night-coverage [email or web address] 06/10/2021, 11:04 AM

## 2021-06-10 NOTE — ED Provider Notes (Signed)
[facility] Provider Note    Event Date/Time   First MD Initiated Contact with Patient 06/10/21 [phone]     (approximate)   History   Shortness of Breath   HPI  Peter [name] is a 81 y.o. male with a past medical history of CHF, CAD, hypertension, COPD, CKD who presents with shortness of breath.  Patient reports he woke up last night feeling short of breath, also reports having some chills but does not think that he had a fever.  Felt nauseated as well.  He reports he is feeling somewhat improved now.  He reports that he uses oxygen at home as needed, typically does not need during the day but states that he did not have it last night "he may not be here right now "     Physical Exam   Triage Vital Signs: ED Triage Vitals  Enc Vitals Group     BP 06/10/21 0643 (!) 192/73     Pulse Rate 06/10/21 0643 77     Resp 06/10/21 0643 18     Temp 06/10/21 0643 99 F (37.2 C)     Temp Source 06/10/21 0643 Oral     SpO2 06/10/21 0643 98 %     Weight 06/10/21 0645 90.7 kg (200 lb)     Height 06/10/21 0645 1.803 m (5\' 11" )     Head Circumference --      Peak Flow --      Pain Score 06/10/21 0644 0     Pain Loc --      Pain Edu? --      Excl. in [place]? --     Most recent vital signs: Vitals:   06/10/21 0830 06/10/21 0900  BP: (!) 162/68 (!) 161/64  Pulse: 70 70  Resp: 19 19  Temp:    SpO2: 96% 96%     General: Awake,  CV:  Good peripheral perfusion.  Regular rate and rhythm Resp:  Normal effort.,  Bibasilar Rales, scattered very mild wheeze Abd:  No distention.  No tenderness palpation Other:  No calf pain or tenderness   ED Results / Procedures / Treatments   Labs (all labs ordered are listed, but only abnormal results are displayed) Labs Reviewed  BASIC METABOLIC PANEL - Abnormal; Notable for the following components:      Result Value   Glucose, Bld 125 (*)    BUN 62 (*)    Creatinine, Ser 3.76 (*)    Calcium 8.3 (*)    GFR, Estimated  15 (*)    All other components within normal limits  CBC WITH DIFFERENTIAL/PLATELET - Abnormal; Notable for the following components:   WBC 13.8 (*)    RBC 3.39 (*)    Hemoglobin 10.0 (*)    HCT 31.3 (*)    Platelets 142 (*)    Neutro Abs 12.4 (*)    Lymphs Abs 0.4 (*)    All other components within normal limits  BRAIN NATRIURETIC PEPTIDE - Abnormal; Notable for the following components:   B Natriuretic Peptide 1,783.5 (*)    All other components within normal limits  TROPONIN I (HIGH SENSITIVITY) - Abnormal; Notable for the following components:   Troponin I (High Sensitivity) 386 (*)    All other components within normal limits  RESP PANEL BY RT-PCR (FLU A&B, COVID) ARPGX2  HEMOGLOBIN A1C  PROCALCITONIN  TROPONIN I (HIGH SENSITIVITY)     EKG  ED ECG REPORT I, Lavonia Drafts, the attending  physician, personally viewed and interpreted this ECG.  Date: 06/10/2021  Rhythm: normal sinus rhythm QRS Axis: Abnormal Intervals: Left bundle branch block ST/T Wave abnormalities: Nonspecific change Narrative Interpretation: Bigeminy    RADIOLOGY Chest x-ray viewed and interpreted by me, cardiomegaly, question right lower infiltrate, pending radiology review    PROCEDURES:  Critical Care performed:   .1-3 Lead EKG Interpretation Performed by: Lavonia Drafts, MD Authorized by: Lavonia Drafts, MD     Interpretation: abnormal     ECG rate assessment: normal     Rhythm: sinus rhythm     Ectopy: bigeminy     Conduction: normal     MEDICATIONS ORDERED IN ED: Medications  albuterol (PROVENTIL) (2.5 MG/3ML) 0.083% nebulizer solution 3 mL (has no administration in time range)  furosemide (LASIX) injection 40 mg (has no administration in time range)  dextromethorphan-guaiFENesin (MUCINEX DM) 30-600 MG per 12 hr tablet 1 tablet (has no administration in time range)  ondansetron (ZOFRAN) injection 4 mg (has no administration in time range)  acetaminophen (TYLENOL) tablet 650  mg (has no administration in time range)  hydrALAZINE (APRESOLINE) injection 5 mg (has no administration in time range)  morphine (PF) 2 MG/ML injection 0.5 mg (has no administration in time range)  nitroGLYCERIN (NITROSTAT) SL tablet 0.4 mg (has no administration in time range)     IMPRESSION / MDM / ASSESSMENT AND PLAN / ED COURSE  I reviewed the triage vital signs and the nursing notes.  Patient presents with shortness of breath, mild chest discomfort as detailed above.  Given chills at night possibility of infection as well.  Differential includes pneumonia, CHF, COPD exacerbation, COVID  We will obtain x-ray, labs, placed the patient on the cardiac monitor  Currently is on 2 L nasal cannula with oxygen saturations above 95%  Patient has significantly abnormal lab work.  Elevated troponin of 386 however this is much lower than it was 12 days ago.  I suspect this is a decreasing troponin as opposed to a new elevation but difficult to ascertain.  EKG is not significantly changed from prior  BNP has decreased to 1700.  Given his chest discomfort, no shortness of breath and multiple risk factor/comorbidities will admit to the hospitalist service  I consulted and discussed with Dr. Saralyn Pilar of the cardiology service      FINAL CLINICAL IMPRESSION(S) / ED DIAGNOSES   Final diagnoses:  Shortness of breath  Congestive heart failure, unspecified HF chronicity, unspecified heart failure type ([place])  Elevated troponin     Rx / DC Orders   ED Discharge Orders     None        Note:  This document was prepared using Dragon voice recognition software and may include unintentional dictation errors.   Lavonia Drafts, MD 06/10/21 [phone]

## 2021-06-10 NOTE — Consult Note (Signed)
[place] NOTE       Patient ID: MONTAVIOUS WIERZBA MRN: [id number] DOB/AGE: 06-28-1940 81 y.o.  Admit date: 06/10/2021 Referring Physician Dr. Lavonia Drafts Primary Physician Mortimer Fries, PA-C Primary Cardiologist Dr. Clayborn Bigness Reason for Consultation SOB, chest discomfort  HPI: The patient is an 81 year old male with a past medical history significant for HFrEF ( 05/29/21 LVEF 40-45%), ischemic cardiomyopathy s/p CRT-D 09/2017, CAD s/p PCI x3 07/2016, history of thoracic aortic aneurysm s/p fenestrated repair 2015, hypertension, hypercholesterolemia, CKD 4, COPD with 3 hospital admissions for symptomatic anemia and acute on chronic CHF since 03/2021 who presented to [facility] ED 05/29/2021 with shortness of breath.  Cardiology is consulted for further assistance and management.   The patient has had 3 hospital admissions over the past 2 months with 2 presentations to the ER in between without admission.  He was admitted for 6 days 04/03/21 - 04/09/21 for symptomatic anemia and AoCHF and troponins were elevated to mid 200s and flat.  He was transfused 2 units PRBCs and diuresed with IV Lasix.  He was admitted for 3 days from 04/29/2021 to 05/02/2021 with AoCHF and hypertensive urgency. BP was controlled, he was adequately diuresed and sent home with 2L of oxygen.   His most recent admission was from 05/28/2021 to 06/04/2021 where he presented with chest pain and dyspnea and melena 2 days prior to arrival. He was found to have a with hemoglobin of 7.7 down from baseline of 10-11 and troponin peak at 1600 thought to be due to demand ischemia.  He had an upper GI study that was negative for ulceration that showed some possible gastritis.  He was transfused 2 units of PRBCs and started on clonidine for HTN.  Plavix, Lasix and spironolactone were held at discharge pending restart after outpatient follow up with Dr. Clayborn Bigness which has not happened yet.  Per review of notes from his PCP visit  4 days ago he did not continue taking his clonidine because it made him feel chest discomfort.  He presents again to [facility] ED this morning complaining of feeling cold all last night not sleeping well and waking up with upper abdominal discomfort attributing to "gas pains" nausea without vomiting and acute shortness of breath prompting him to call EMS.  His friend at bedside states the patient was having some abdominal pain last night and overall was not feeling well.  During interview this morning when asked how he feels he says "he could be sitting at home watching TV" and denies recurrence of his upper abdominal pain, nausea or shortness of breath.  His blood pressure when EMS arrived was reportedly 222/100.  He was given 40 mg of IV Lasix without real change in symptoms. Denies significant worsening LEE or chest pain.   Vitals are significant for blood pressure of 171/70 before he has gotten all his morning blood pressure medications, SPO2 98% on room air.  Labs on admission notable for a creatinine of 3.76, EGFR 15 (baseline from discharge 6 days ago 3.58, GFR 16), BNP 1783 (decreased from 2842 2 weeks ago), troponin trending 386-525 with repeats pending (highest value 1696 on 2/16), procalcitonin less than 0.10, white count 13.8, H&H 10/31.3 which appear to be at his baseline, platelets 142.  Chest x-ray without acute cardiopulmonary abnormality.  Respiratory panel negative for influenza and COVID.   Review of systems complete and found to be negative unless listed above     Past Medical History:  Diagnosis Date   Anginal  pain ([place])    Asthma    Bladder infection, acute 03/2011   "had a whole lot of bleeding from this"   CHF (congestive heart failure) ([place])    Coronary artery disease    High cholesterol    Hypertension    Macular degeneration 12/14/2011   "had it in my right; getting shots now in my left"   Myocardial infarction [facility]) 1996   NSTEMI (non-ST elevated myocardial infarction)  ([place]) 12/14/2011   Shortness of breath 12/14/2011   "@ rest, lying down, w/exertion"    Past Surgical History:  Procedure Laterality Date   [place]; ~ 2003; ~ 2008   "total of 3"   CORONARY STENT INTERVENTION N/A 08/10/2016   Procedure: Coronary Stent Intervention;  Surgeon: [name], MD;  Location: [place] CV LAB;  Service: Cardiovascular;  Laterality: N/A;   HERNIA REPAIR  ~ 2001   "abdominal w/mesh implanted"   LEFT HEART CATH AND CORONARY ANGIOGRAPHY N/A 08/10/2016   Procedure: Left Heart Cath and Coronary Angiography;  Surgeon: [name], MD;  Location: [place] CV LAB;  Service: Cardiovascular;  Laterality: N/A;   LEFT HEART CATHETERIZATION WITH CORONARY ANGIOGRAM N/A 12/16/2011   Procedure: LEFT HEART CATHETERIZATION WITH CORONARY ANGIOGRAM;  Surgeon: Minus Breeding, MD;  Location: [facility] CATH LAB;  Service: Cardiovascular;  Laterality: N/A;   PERCUTANEOUS CORONARY STENT INTERVENTION (PCI-S) N/A 12/17/2011   Procedure: PERCUTANEOUS CORONARY STENT INTERVENTION (PCI-S);  Surgeon: Sherren Mocha, MD;  Location: [facility] CATH LAB;  Service: Cardiovascular;  Laterality: N/A;   TONSILLECTOMY     "I was a kid"    (Not in a hospital admission)  Social History   Socioeconomic History   Marital status: Widowed    Spouse name: Not on file   Number of children: Not on file   Years of education: Not on file   Highest education level: Not on file  Occupational History   Not on file  Tobacco Use   Smoking status: Former    Packs/day: 0.50    Years: 0.50    Pack years: 0.25    Types: Cigarettes   Smokeless tobacco: Never   Tobacco comments:    11/23/16 Still not ready to quit smoking.  Substance and Sexual Activity   Alcohol use: Yes    Alcohol/week: 1.0 standard drink    Types: 1 Cans of beer per week    Comment: 12/14/2011 "if my kidneys are sore, I drink 1 beer/day; if not sore; no beer; occasionally drink socially; ave 1  beer/wk maybe"   Drug use: No   Sexual activity: Not Currently  Other Topics Concern   Not on file  Social History Narrative   Not on file   Social Determinants of Health   Financial Resource Strain: Not on file  Food Insecurity: Not on file  Transportation Needs: Not on file  Physical Activity: Not on file  Stress: Not on file  Social Connections: Not on file  Intimate Partner Violence: Not on file    Family History  Family history unknown: Yes      Review of systems complete and found to be negative unless listed above    PHYSICAL EXAM General: Elderly appearing Caucasian male, well nourished, in no acute distress.  Sitting at incline in ED stretcher, male friend at bedside. HEENT:  Normocephalic and atraumatic. Neck:  No JVD.  Lungs: Normal respiratory effort on 2 L by nasal cannula. Clear bilaterally to auscultation. No wheezes, crackles,  rhonchi.  Heart: Regular rate with frequent extra beats. Normal S1 and S2 without gallops or murmurs. Radial & DP pulses 2+ bilaterally. Abdomen: Non-distended appearing.  Msk: Normal strength and tone for age. Extremities: Warm and well perfused. No clubbing, cyanosis.  1+ pitting edema bilaterally to the top of his ankle sock Neuro: Alert and oriented X 3. Psych:  Answers questions appropriately.   Labs:   Lab Results  Component Value Date   WBC 13.8 (H) 06/10/2021   HGB 10.0 (L) 06/10/2021   HCT 31.3 (L) 06/10/2021   MCV 92.3 06/10/2021   PLT 142 (L) 06/10/2021    Recent Labs  Lab 06/10/21 0700  NA 139  K 3.8  CL 107  CO2 23  BUN 62*  CREATININE 3.76*  CALCIUM 8.3*  GLUCOSE 125*   Lab Results  Component Value Date   CKTOTAL 23 (L) 12/11/2013   CKMB 1.0 12/11/2013   TROPONINI 0.05 (HH) 12/21/2017    Lab Results  Component Value Date   CHOL 110 05/29/2021   CHOL 108 08/08/2016   CHOL 141 12/16/2011   Lab Results  Component Value Date   HDL 29 (L) 05/29/2021   HDL 36 (L) 08/08/2016   HDL 38 (L)  12/16/2011   Lab Results  Component Value Date   LDLCALC 59 05/29/2021   LDLCALC 50 08/08/2016   LDLCALC 83 12/16/2011   Lab Results  Component Value Date   TRIG 110 05/29/2021   TRIG 111 08/08/2016   TRIG 102 12/16/2011   Lab Results  Component Value Date   CHOLHDL 3.8 05/29/2021   CHOLHDL 3.0 08/08/2016   CHOLHDL 3.7 12/16/2011   No results found for: LDLDIRECT    Radiology: CT CHEST WO CONTRAST  Result Date: 05/28/2021 CLINICAL DATA:  Shortness of breath. History of congestive heart failure. Substantial smoking history. EXAM: CT CHEST WITHOUT CONTRAST TECHNIQUE: Multidetector CT imaging of the chest was performed following the standard protocol without IV contrast. RADIATION DOSE REDUCTION: This exam was performed according to the departmental dose-optimization program which includes automated exposure control, adjustment of the mA and/or kV according to patient size and/or use of iterative reconstruction technique. COMPARISON:  Chest radiograph 05/01/2021 and chest CT 04/12/2021 FINDINGS: Cardiovascular: Coronary, aortic arch, and branch vessel atherosclerotic vascular disease. Coronary stents. AICD noted. 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 Small anterior pericardial effusion. Borderline cardiomegaly. Descending thoracic aortic and upper abdominal aortic stent. Mediastinum/Nodes: AP window lymph node 0.8 cm in short axis on image 62 series 2, stable. Low-density subcarinal lymph nodes including a 1.0 cm node on image 89 series 2, formerly the same. Other scattered small mediastinal lymph nodes are present. Lungs/Pleura: Small bilateral pleural effusions although both are increased from 04/12/2021. Centrilobular emphysema. Mild secondary pulmonary lobular septal accentuation of the apices. Bilateral airway thickening. 1.0 by 0.6 cm stable ground-glass density nodule in the apicoposterior segment left upper lobe on  image 22 series 3. Ground-glass density peribronchovascular nodule in the left upper lobe on image 33 series 3 is stable at 1.2 by 0.7 cm. Ground-glass density 1.7 by 1.0 cm right lower lobe nodule on image 72 series 3. Increased density of a 0.8 by 0.5 cm right upper lobe sub solid pulmonary nodule on image 62 series 3 compared to the 04/12/2021 exam. Mildly increased atelectasis in the left lower lobe compared to previous. Upper Abdomen: Small depending gallstones in the gallbladder. Upper abdominal aortic stent  with stents in the proximal celiac trunk, SMA, and renal arteries. We partially image a ventral hernia containing adipose tissue on image 185 series 2. Musculoskeletal: Bilateral gynecomastia. 2.0 by 0.6 cm sclerotic lesion in the right posterolateral sixth rib, no change from 12/14/2011, considered benign. IMPRESSION: 1. Small but mildly worsened bilateral pleural effusions with associated passive atelectasis. 2. Airway thickening is present, suggesting bronchitis or reactive airways disease. 3. Several ground-glass density pulmonary nodules are present including two left apical nodule stable from 04/12/2021 (questionable precursors of these lesions on 12/14/2011) and a right upper lobe nodule which has increased in density compared to that time, as well as a stable right lower lobe ground-glass density pulmonary nodule. Low-grade adenocarcinoma is a possibility and surveillance is recommended. 4. Aortic Atherosclerosis (ICD10-I70.0) and Emphysema (ICD10-J43.9). Coronary atherosclerosis. 5. 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 Recommend semi-annual imaging followup by CTA or MRA and referral to cardiothoracic surgery if not already obtained. This recommendation follows 2010 ACCF/AHA/AATS/ACR/ASA/SCA/SCAI/SIR/STS/SVM Guidelines for the Diagnosis and Management of Patients With Thoracic Aortic Disease. Circulation. 2010; 121: H885-O27. Aortic aneurysm NOS (ICD10-I71.9) 6. Coronary and abdominal aortic stents  along with stents in the celiac trunk, SMA, and proximal renal arteries. AICD noted. 7. Small anterior pericardial effusion. 8. Cholelithiasis. 9. Partially imaged ventral hernia containing adipose tissue. Electronically Signed   By: Van Clines M.D.   On: 05/28/2021 08:44   DG Chest Portable 1 View  Result Date: 06/10/2021 CLINICAL DATA:  81 year old male with shortness of breath and nausea. Former smoker. EXAM: PORTABLE CHEST 1 VIEW COMPARISON:  Portable chest 05/28/2021 and earlier. FINDINGS: Portable AP upright views at 0651 hours. Stable left chest cardiac AICD. Stable mild cardiomegaly and mediastinal contours. Visualized tracheal air column is within normal limits. Chronic increased pulmonary interstitial markings with mild basilar predominance on the right appears stable since January. No superimposed pneumothorax, pleural effusion, or consolidation. No acute osseous abnormality identified. Negative visible bowel gas. IMPRESSION: Stable portable appearance of the chest since January, with suspected chronic interstitial changes. No acute cardiopulmonary abnormality. Electronically Signed   By: Genevie Ann M.D.   On: 06/10/2021 07:45   DG Chest Portable 1 View  Result Date: 05/28/2021 CLINICAL DATA:  Shortness of breath, chest pain EXAM: PORTABLE CHEST 1 VIEW COMPARISON:  05/01/2021 FINDINGS: Lungs are clear.  No pleural effusion or pneumothorax. The heart is normal in size.  Left subclavian ICD. Mild degenerative changes of the mid thoracic spine. IMPRESSION: No evidence of acute cardiopulmonary disease. Electronically Signed   By: Julian Hy M.D.   On: 05/28/2021 19:04   DG UGI W SINGLE CM (SOL OR THIN BA)  Result Date: 05/30/2021 CLINICAL DATA:  Upper GI bleed. Evaluate for mucosal ulceration/defect. EXAM: DG UGI W SINGLE CM TECHNIQUE: Scout radiograph was obtained. Single contrast examination was performed using thin liquid barium. This exam was performed by Candiss Norse, PA, and  was supervised and interpreted by myself. FLUOROSCOPY TIME:  Radiation Exposure Index (as provided by the fluoroscopic device): 44.4 mGy COMPARISON:  Chest CT 05/27/2021.  Abdominopelvic CT 04/04/2021. FINDINGS: Scout Radiograph: Due to reported contrast allergy, the study was performed with thin barium after obtaining supine and erect views of the abdomen. The bowel gas pattern is nonobstructive. There is no evidence of pneumoperitoneum. Extensive postsurgical changes are noted, related to previous aortoiliac stent grafting. The patient has an AICD. Study was performed in the supine and semi erect positions. Patient was unable to stand or lie prone. The patient swallowed the barium without  difficulty. The esophageal motility appears within normal limits. There is no evidence of stricture, mass or ulceration. Mild gastric fold thickening is noted which could indicate gastritis. No evidence of mucosal ulceration. The duodenum appears normal. IMPRESSION: 1. No specific cause for upper GI bleeding identified. There is no evidence of mucosal ulceration. 2. Gastric fold thickening which could indicate gastritis. 3. No significant esophageal abnormality. Electronically Signed   By: Richardean Sale M.D.   On: 05/30/2021 13:12   ECHOCARDIOGRAM COMPLETE  Result Date: 05/29/2021    ECHOCARDIOGRAM REPORT   Patient Name:   YVAN DORITY Date of Exam: 05/29/2021 Medical Rec #:  [id number]        Height:       70.0 in Accession #:    [id number]       Weight:       204.0 lb Date of Birth:  Nov 23, 1940       BSA:          2.105 m Patient Age:    75 years         BP:           166/66 mmHg Patient Gender: M                HR:           62 bpm. Exam Location:  [facility] Procedure: 2D Echo, Color Doppler, Cardiac Doppler and Intracardiac            Opacification Agent Indications:     R07.9 Chest Pain  History:         Patient has prior history of Echocardiogram examinations, most                  recent 04/04/2021. CHF, CAD,  Pacemaker; Risk Factors:HCL.  Sonographer:     Charmayne Sheer Referring Phys:  [place] Diagnosing Phys: Serafina Royals MD  Sonographer Comments: Technically difficult study due to poor echo windows. Image acquisition challenging due to respiratory motion. IMPRESSIONS  1. Left ventricular ejection fraction, by estimation, is 40 to 45%. The left ventricle has mildly decreased function. The left ventricle demonstrates global hypokinesis. The left ventricular internal cavity size was mildly dilated. Left ventricular diastolic parameters were normal.  2. Right ventricular systolic function is normal. The right ventricular size is normal.  3. Left atrial size was mildly dilated.  4. Right atrial size was mildly dilated.  5. The mitral valve is normal in structure. Mild to moderate mitral valve regurgitation.  6. Tricuspid valve regurgitation is mild to moderate.  7. The aortic valve is normal in structure. Aortic valve regurgitation is not visualized. FINDINGS  Left Ventricle: Left ventricular ejection fraction, by estimation, is 40 to 45%. The left ventricle has mildly decreased function. The left ventricle demonstrates global hypokinesis. Definity contrast agent was given IV to delineate the left ventricular  endocardial borders. The left ventricular internal cavity size was mildly dilated. There is no left ventricular hypertrophy. Left ventricular diastolic parameters were normal. Right Ventricle: The right ventricular size is normal. No increase in right ventricular wall thickness. Right ventricular systolic function is normal. Left Atrium: Left atrial size was mildly dilated. Right Atrium: Right atrial size was mildly dilated. Pericardium: There is no evidence of pericardial effusion. Mitral Valve: The mitral valve is normal in structure. Mild to moderate mitral valve regurgitation. MV peak gradient, 6.6 mmHg. The mean mitral valve gradient is 4.0 mmHg. Tricuspid Valve: The tricuspid valve is normal in  structure.  Tricuspid valve regurgitation is mild to moderate. Aortic Valve: The aortic valve is normal in structure. Aortic valve regurgitation is not visualized. Aortic valve mean gradient measures 11.0 mmHg. Aortic valve peak gradient measures 19.0 mmHg. Aortic valve area, by VTI measures 2.27 cm. Pulmonic Valve: The pulmonic valve was normal in structure. Pulmonic valve regurgitation is trivial. Aorta: The aortic root and ascending aorta are structurally normal, with no evidence of dilitation. IAS/Shunts: No atrial level shunt detected by color flow Doppler.  LEFT VENTRICLE PLAX 2D LVIDd:         5.23 cm   Diastology LVIDs:         4.07 cm   LV e' medial:    5.33 cm/s LV PW:         1.39 cm   LV E/e' medial:  18.0 LV IVS:        1.11 cm   LV e' lateral:   5.66 cm/s LVOT diam:     2.50 cm   LV E/e' lateral: 16.9 LV SV:         116 LV SV Index:   55 LVOT Area:     4.91 cm  RIGHT VENTRICLE RV Basal diam:  4.45 cm LEFT ATRIUM           Index        RIGHT ATRIUM           Index LA diam:      3.70 cm 1.76 cm/m   RA Area:     17.10 cm LA Vol (A4C): 73.3 ml 34.82 ml/m  RA Volume:   40.00 ml  19.00 ml/m  AORTIC VALVE                     PULMONIC VALVE AV Area (Vmax):    2.61 cm      PV Vmax:       0.87 m/s AV Area (Vmean):   2.33 cm      PV Vmean:      57.000 cm/s AV Area (VTI):     2.27 cm      PV VTI:        0.157 m AV Vmax:           218.00 cm/s   PV Peak grad:  3.0 mmHg AV Vmean:          160.000 cm/s  PV Mean grad:  2.0 mmHg AV VTI:            0.513 m AV Peak Grad:      19.0 mmHg AV Mean Grad:      11.0 mmHg LVOT Vmax:         116.00 cm/s LVOT Vmean:        75.800 cm/s LVOT VTI:          0.237 m LVOT/AV VTI ratio: 0.46  AORTA Ao Root diam: 3.40 cm MITRAL VALVE                TRICUSPID VALVE MV Area (PHT): 3.44 cm     TR Peak grad:   43.6 mmHg MV Area VTI:   3.32 cm     TR Vmax:        330.00 cm/s MV Peak grad:  6.6 mmHg MV Mean grad:  4.0 mmHg     SHUNTS MV Vmax:       1.28 m/s     Systemic VTI:  0.24 m MV  Vmean:  93.5 cm/s    Systemic Diam: 2.50 cm MV Decel Time: 221 msec MV E velocity: 95.75 cm/s MV A velocity: 106.00 cm/s MV E/A ratio:  0.90 Serafina Royals MD Electronically signed by Serafina Royals MD Signature Date/Time: 05/29/2021/12:28:40 PM    Final     ECHO 05/29/2021 LVEF 40-45% with global hypokinesis mild to moderate MR, mild to moderate TR  TELEMETRY reviewed by me: Sinus rhythm frequent PVCs, rate between 70 and 80  EKG reviewed by me: NSR rate 84, ventricular bigeminy, LBBB, not significantly changed from 05/28/2021  ASSESSMENT AND PLAN:  The patient is an 81 year old male with a past medical history significant for HFrEF ( 05/29/21 LVEF 40-45%), ischemic cardiomyopathy s/p CRT-D 09/2017, CAD s/p PCI x3 07/2016, history of thoracic aortic aneurysm s/p fenestrated repair 2015, hypertension, hypercholesterolemia, CKD 4, COPD with 3 hospital admissions for symptomatic anemia and acute on chronic CHF since 03/2021 who presented to [facility] ED 05/29/2021 with shortness of breath.  Cardiology is consulted for further assistance and management.   #Dyspnea #elevated troponin #hypertensive urgency #HFrEF (LVEF 40-45%, global hypo), ICM s/p CRT-D 05/2017 The patient presents with acute onset of shortness of breath preceded by upper abdominal pain, nausea without vomiting and a blood pressure reportedly of 222/100 by EMS.  He has not been taking Lasix, Entresto, Plavix since they were held at discharge on 2/22 from his last admission.  His BNP today is around 1700, decreased from last admission at 2800.  Troponins are trending 300-525 with repeats pending.  His hemoglobin is notably at his baseline of 10 and WBCs increased 13.8.  Denies recurrence of his symptoms since being in the ED and cannot identify anything that really made the symptoms go away. Will continue to trend troponins, elevation could be due to his significantly elevated BP. No significant EKG changes and patient denies chest pain.  He was  given 40 mg of IV Lasix in the ED without any change in how he feels. -BP significantly elevated on presentation, now 171/90. PRN hydralazine for SBP >160 -Trend troponins until peak -continuous telemetry monitoring -repeat EKG with chest discomfort -Continue Coreg 6.25 twice daily, hydralazine 100 mg 3 times daily, amlodipine 10mg  once daily  -restart home lasix 40mg  once daily with close monitoring of his renal function, consider nephrology input but will defer to primary team.  -declines clonidine due to a "sensation he has when he gets MRI contrast" -Recommend conservative management form a cardiac standpoint per Dr. Saralyn Pilar  #CAD s/p PCI x3 07/2016 -S/p 325mg  Aspirin, Continue aspirin 81 mg daily. -continue to hold plavix for now.  -Continue isosorbide 30mg  daily -Continue Lipitor 40mg  daily  #CKD 4 Renal function appears to be at his baseline with creatinine 3.76-EGFR 15. 06/04/2021 was 3.58, gfr 16.  #COPD On his home 2L he states he uses PRN for dyspena Recommend outpatient f/u with pulmonology   This patient's plan of care was discussed and created with Dr. [name] and he is in agreement.  Signed: Tristan Schroeder , PA-C 06/10/2021, 9:53 AM [facility]

## 2021-06-11 DIAGNOSIS — I5023 Acute on chronic systolic (congestive) heart failure: Secondary | ICD-10-CM | POA: Diagnosis not present

## 2021-06-11 DIAGNOSIS — J9611 Chronic respiratory failure with hypoxia: Secondary | ICD-10-CM | POA: Diagnosis present

## 2021-06-11 LAB — CBC
HCT: 26.2 % — ABNORMAL LOW (ref 39.0–52.0)
Hemoglobin: 8.4 g/dL — ABNORMAL LOW (ref 13.0–17.0)
MCH: 29 pg (ref 26.0–34.0)
MCHC: 32.1 g/dL (ref 30.0–36.0)
MCV: 90.3 fL (ref 80.0–100.0)
Platelets: 109 10*3/uL — ABNORMAL LOW (ref 150–400)
RBC: 2.9 MIL/uL — ABNORMAL LOW (ref 4.22–5.81)
RDW: 13.7 % (ref 11.5–15.5)
WBC: 18.3 10*3/uL — ABNORMAL HIGH (ref 4.0–10.5)
nRBC: 0 % (ref 0.0–0.2)

## 2021-06-11 LAB — BASIC METABOLIC PANEL
Anion gap: 9 (ref 5–15)
BUN: 62 mg/dL — ABNORMAL HIGH (ref 8–23)
CO2: 22 mmol/L (ref 22–32)
Calcium: 8 mg/dL — ABNORMAL LOW (ref 8.9–10.3)
Chloride: 109 mmol/L (ref 98–111)
Creatinine, Ser: 3.87 mg/dL — ABNORMAL HIGH (ref 0.61–1.24)
GFR, Estimated: 15 mL/min — ABNORMAL LOW (ref >60)
Glucose, Bld: 100 mg/dL — ABNORMAL HIGH (ref 70–99)
Potassium: 3.9 mmol/L (ref 3.5–5.1)
Sodium: 140 mmol/L (ref 135–145)

## 2021-06-11 LAB — TROPONIN I (HIGH SENSITIVITY)
Troponin I (High Sensitivity): 3025 ng/L (ref <18)
Troponin I (High Sensitivity): 3184 ng/L (ref <18)
Troponin I (High Sensitivity): 3038 ng/L (ref <18)
Troponin I (High Sensitivity): 3324 ng/L (ref <18)

## 2021-06-11 LAB — MAGNESIUM: Magnesium: 1.7 mg/dL (ref 1.7–2.4)

## 2021-06-11 NOTE — TOC CM/SW Note (Signed)
CSW acknowledges heart failure consults. Notified heart failure nurse navigator. ? ?[place] Scrape, [place] ?[phone] ? ?

## 2021-06-11 NOTE — Progress Notes (Signed)
Assisted tech with patient's bed bath and full linen change. Changed patient into clothes he brought from home. Offered patient a drink and provided iced soda and cup of ice. ?

## 2021-06-11 NOTE — TOC Initial Note (Addendum)
Transition of Care (TOC) - Initial/Assessment Note  ? ? ?Patient Details  ?Name: Peter [name] ?MRN: [id number] ?Date of Birth: [birth year] ? ?Transition of Care (TOC) CM/SW Contact:    ?Candie Chroman, LCSW ?Phone Number: ?06/11/2021, 12:32 PM ? ?Clinical Narrative: Readmission prevention screen complete. CSW met with patient. No supports at bedside. CSW introduced role and explained that discharge planning would be discussed. PCP is Dr. Ginette Pitman. Patient has not driven in the last 3 months. His "lady friend" drives him to appointments. Pharmacy is [facility] on corner of [address]. No issues obtaining medications. Patient was set up with [facility] for PT last week. Patient has a walker at home if needed. He uses oxygen at home as needed, primarily when he sits down. Oxygen provided through [employer]. Spoke with representative who said patient has an outstanding balance due to insurance denying authorization for oxygen. He requested that we try to requalify him prior to discharge. Patietn is aware and agreeable. Sent secure chat to MD, pulmonology, and RN to notify. No further concerns. CSW encouraged patient to contact CSW as needed. CSW will continue to follow patient for support and facilitate return home when stable. He said his friend will drive him home at discharge. His portable oxygen is on the counter in his room.               ? ?4:02 pm: Received call from [place] from [facility]. Daughter requested they come assess him for potential admission. ? ?Expected Discharge Plan: [place] ?Barriers to Discharge: Continued Medical Work up ? ? ?Patient Goals and CMS Choice ?  ?  ?Choice offered to / list presented to : NA ? ?Expected Discharge Plan and Services ?Expected Discharge Plan: [place] ?  ?  ?Post Acute Care Choice: Resumption of Svcs/PTA Provider ?Living arrangements for the past 2 months: [place] ?                ?  ?   ?  ?  ?  ?HH Arranged: PT ?[place] Agency: [place] ([place]) ?Date HH Agency Contacted: 06/11/21 ?  ?Representative spoke with at [place]: Floydene Flock ? ?Prior Living Arrangements/Services ?Living arrangements for the past 2 months: [place] ?Lives with:: Significant Other ?Patient language and need for interpreter reviewed:: Yes ?Do you feel safe going back to the place where you live?: Yes      ?Need for Family Participation in Patient Care: Yes (Comment) ?Care giver support system in place?: Yes (comment) ?  ?Criminal Activity/Legal Involvement Pertinent to Current Situation/Hospitalization: No - Comment as needed ? ?Activities of Daily Living ?Home Assistive Devices/Equipment: Gilford Rile (specify type) ?ADL Screening (condition at time of admission) ?Patient's cognitive ability adequate to safely complete daily activities?: Yes ?Is the patient deaf or have difficulty hearing?: Yes ?Does the patient have difficulty seeing, even when wearing glasses/contacts?: No ?Does the patient have difficulty concentrating, remembering, or making decisions?: No ?Patient able to express need for assistance with ADLs?: Yes ?Does the patient have difficulty dressing or bathing?: Yes ?Independently performs ADLs?: No ?Communication: Independent ?Dressing (OT): Needs assistance ?Grooming: Appropriate for developmental age ?Feeding: Independent ?Bathing: Appropriate for developmental age ?Toileting: Appropriate for developmental age ?In/Out Bed: Appropriate for developmental age ?Walks in Home: Appropriate for developmental age ?Does the patient have difficulty walking or climbing stairs?: Yes ?Weakness of Legs: Both ?Weakness of Arms/Hands: None ? ?Permission Sought/Granted ?Permission sought  to share information with : Customer service manager ?Permission granted to share information with : Yes, Verbal Permission Granted ?   ? Permission granted to share info w AGENCY: [place], [place] ?   ?   ? ?Emotional Assessment ?Appearance:: Appears stated age ?Attitude/Demeanor/Rapport: Engaged, Gracious ?Affect (typically observed): Accepting, Appropriate, Calm, Pleasant ?Orientation: : Oriented to Self, Oriented to Place, Oriented to  Time, Oriented to Situation ?Alcohol / Substance Use: Not Applicable ?Psych Involvement: No (comment) ? ?Admission diagnosis:  Shortness of breath [R06.02] ?SOB (shortness of breath) [R06.02] ?Elevated troponin [R77.8] ?Acute on chronic systolic CHF (congestive heart failure) ([place]) [I50.23] ?Congestive heart failure, unspecified HF chronicity, unspecified heart failure type ([place]) [I50.9] ?Patient Active Problem List  ? Diagnosis Date Noted  ? HLD (hyperlipidemia) 06/10/2021  ? CKD (chronic kidney disease), stage IV ([place]) 06/10/2021  ? Elevated troponin   ? Chest pain 05/28/2021  ? Acute exacerbation of CHF (congestive heart failure) ([place]) 04/27/2021  ? CKD (chronic kidney disease) stage 4, GFR 15-29 ml/min (HCC) 04/04/2021  ? 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 Acute anemia 04/04/2021  ? Thrombocytopenia ([place]) 04/04/2021  ? ABLA (acute blood loss anemia) 04/04/2021  ? AKI (acute kidney injury) ([place]) 04/04/2021  ? Normocytic anemia   ? 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 CKD (chronic kidney disease) stage 3, GFR 30-59 ml/min (HCC) 05/18/2018  ? Elevated hemoglobin A1c 05/18/2018  ? History of skin cancer 05/18/2018  ? Ischemic cardiomyopathy 12/16/2017  ? S/P ICD (internal cardiac defibrillator) procedure 12/16/2017  ? Acute on chronic systolic CHF (congestive heart failure) ([place]) 09/07/2016  ? Congestive heart failure ([place]) 01/17/2015  ? Current tobacco use 01/17/2015  ? Hypercholesterolemia 01/17/2015  ? Peripheral vascular disease ([place]) 01/17/2015  ? 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 Arthritis, degenerative 07/22/2013  ? H/O angina  pectoris 07/22/2013  ? Adiposity 07/22/2013  ? 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 NSTEMI (non-ST elevated myocardial infarction) ([place]) 12/17/2011  ? Hypokalemia 12/15/2011  ? SOB (shortness of breath) 12/14/2011  ? CAD (coronary artery disease) 12/14/2011  ? HTN (hypertension) 12/14/2011  ? Hyperlipidemia 12/14/2011  ? GERD (gastroesophageal reflux disease) 12/14/2011  ? BPH (benign prostatic hyperplasia) 12/14/2011  ? 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 ?PCP:  Tracie Harrier, MD ?Pharmacy:   ?[facility] [address] ?[place] [address] ?Phone: [phone] Fax: [phone] ? ? ? ? ?Social Determinants of Health (SDOH) Interventions ?  ? ?Readmission Risk Interventions ?Readmission Risk Prevention Plan 06/11/2021 04/28/2021  ?Transportation Screening Complete Complete  ?PCP or Specialist Appt within 3-5 Days - Complete  ?[place] or Home Care Consult - Complete  ?Social Work Consult for [place] Planning/Counseling - Complete  ?Palliative Care Screening - Not Applicable  ?Medication Review Press photographer) Complete Complete  ?PCP or Specialist appointment within 3-5 days of discharge Complete -  ?[place] or Home Care Consult Complete -  ?SW Recovery Care/Counseling Consult Complete -  ?Palliative Care Screening Not Applicable -  ?[place] Not Applicable -  ?Some recent data might be hidden  ? ? ? ?

## 2021-06-11 NOTE — Progress Notes (Signed)
[place] NOTE       Patient ID: Peter [name] MRN: [id number] DOB/AGE: March 09, 1941 81 y.o.  Admit date: 06/10/2021 Referring Physician Dr. Lavonia Drafts Primary Physician Mortimer Fries, PA-C Primary Cardiologist Dr. Clayborn Bigness Reason for Consultation SOB, chest discomfort  HPI: The patient is an 81 year old male with a past medical history significant for HFrEF ( 05/29/21 LVEF 40-45%), ischemic cardiomyopathy s/p CRT-D 09/2017, CAD s/p PCI x3 07/2016, history of thoracic aortic aneurysm s/p fenestrated repair 2015, hypertension, hypercholesterolemia, CKD 4, COPD with 3 hospital admissions for symptomatic anemia and acute on chronic CHF since 03/2021 who presented to [facility] ED 05/29/2021 with shortness of breath.  Cardiology is consulted for further assistance and management.   Interval history: -continues to deny chest pain or discomfort, no worsening of SOB or increasing oxygen requirement. Denies chills, worsening LEE, cough.  -troponin continues to uptrend, leukocytosis worsening to 18 and Hgb dropping form 10-8.4 overnight without obvious signs of bleeding  Review of systems complete and found to be negative unless listed above     Past Medical History:  Diagnosis Date   Anginal pain ([place])    Asthma    Bladder infection, acute 03/2011   "had a whole lot of bleeding from this"   CHF (congestive heart failure) ([place])    Coronary artery disease    High cholesterol    Hypertension    Macular degeneration 12/14/2011   "had it in my right; getting shots now in my left"   Myocardial infarction [facility]) 1996   NSTEMI (non-ST elevated myocardial infarction) ([place]) 12/14/2011   Shortness of breath 12/14/2011   "@ rest, lying down, w/exertion"    Past Surgical History:  Procedure Laterality Date   [place]; ~ 2003; ~ 2008   "total of 3"   CORONARY STENT INTERVENTION N/A 08/10/2016   Procedure: Coronary Stent Intervention;  Surgeon:  [name], MD;  Location: [place] [facility];  Service: Cardiovascular;  Laterality: N/A;   HERNIA REPAIR  ~ 2001   "abdominal w/mesh implanted"   LEFT HEART CATH AND CORONARY ANGIOGRAPHY N/A 08/10/2016   Procedure: Left Heart Cath and Coronary Angiography;  Surgeon: [name], MD;  Location: [place] [facility];  Service: Cardiovascular;  Laterality: N/A;   LEFT HEART CATHETERIZATION WITH CORONARY ANGIOGRAM N/A 12/16/2011   Procedure: LEFT HEART CATHETERIZATION WITH CORONARY ANGIOGRAM;  Surgeon: Minus Breeding, MD;  Location: [facility] CATH LAB;  Service: Cardiovascular;  Laterality: N/A;   PERCUTANEOUS CORONARY STENT INTERVENTION (PCI-S) N/A 12/17/2011   Procedure: PERCUTANEOUS CORONARY STENT INTERVENTION (PCI-S);  Surgeon: Sherren Mocha, MD;  Location: [facility] CATH LAB;  Service: Cardiovascular;  Laterality: N/A;   TONSILLECTOMY     "I was a kid"    Medications Prior to Admission  Medication Sig Dispense Refill Last Dose   amLODipine (NORVASC) 10 MG tablet Take 1 tablet (10 mg total) by mouth daily. 30 tablet 2 06/09/2021 at 0800   atorvastatin (LIPITOR) 40 MG tablet Take 1 tablet (40 mg total) by mouth daily at 6 PM. 30 tablet 0 06/10/2021 at 0300   calcitRIOL (ROCALTROL) 0.25 MCG capsule Take 0.25 mcg by mouth daily.   06/09/2021 at unknown   carvedilol (COREG) 6.25 MG tablet Take 1 tablet (6.25 mg total) by mouth 2 (two) times daily with a meal. 60 tablet 2 06/10/2021 at 0300   cetirizine (ZYRTEC) 10 MG tablet Take 10 mg by mouth.      citalopram (CELEXA) 20 MG tablet  Take 20 mg by mouth daily.   06/09/2021 at 0800   clobetasol cream (TEMOVATE) 0.17 % Apply 1 application topically 2 (two) times daily as needed (psoriasis).  1  at unknown prn   cyanocobalamin 1000 MCG tablet Take 1,000 mcg by mouth daily.       finasteride (PROSCAR) 5 MG tablet Take 1 tablet (5 mg total) by mouth daily. 30 tablet 2 06/09/2021 at 0800   GLUCOSAMINE-CHONDROITIN-VIT C PO Take 1 tablet by mouth daily.       hydrALAZINE (APRESOLINE) 100 MG tablet Take 1 tablet (100 mg total) by mouth 3 (three) times daily. 90 tablet 2 06/10/2021 at 0300   isosorbide mononitrate (IMDUR) 30 MG 24 hr tablet Take 1 tablet (30 mg total) by mouth daily. 30 tablet 2 06/09/2021 at 0800   omeprazole (PRILOSEC) 20 MG capsule Take 20 mg by mouth in the morning.   06/09/2021 at 0800   tamsulosin (FLOMAX) 0.4 MG CAPS capsule Take 1 capsule (0.4 mg total) by mouth daily. 30 capsule 2 06/09/2021 at 0800   umeclidinium bromide (INCRUSE ELLIPTA) 62.5 MCG/ACT AEPB Inhale 1 puff into the lungs daily. 30 each 2 06/10/2021 at 0600   cloNIDine (CATAPRES) 0.1 MG tablet Take 1 tablet (0.1 mg total) by mouth 2 (two) times daily. (Patient not taking: Reported on 06/05/2021) 60 tablet 2 Not Taking   clopidogrel (PLAVIX) 75 MG tablet Hold until outpatient followup with cardiology due to worsening anemia. (Patient not taking: Reported on 06/05/2021) 30 tablet 1    furosemide (LASIX) 40 MG tablet Hold until outpatient followup with cardiology. (Patient not taking: Reported on 06/05/2021) 30 tablet     spironolactone (ALDACTONE) 25 MG tablet Hold until followup with your outpatient doctor due to worsening kidney function. (Patient not taking: Reported on 06/05/2021)       Social History   Socioeconomic History   Marital status: Widowed    Spouse name: Not on file   Number of children: Not on file   Years of education: Not on file   Highest education level: Not on file  Occupational History   Not on file  Tobacco Use   Smoking status: Former    Packs/day: 0.50    Years: 0.50    Pack years: 0.25    Types: Cigarettes   Smokeless tobacco: Never   Tobacco comments:    11/23/16 Still not ready to quit smoking.  Substance and Sexual Activity   Alcohol use: Yes    Alcohol/week: 1.0 standard drink    Types: 1 Cans of beer per week    Comment: 12/14/2011 "if my kidneys are sore, I drink 1 beer/day; if not sore; no beer; occasionally drink socially; ave 1  beer/wk maybe"   Drug use: No   Sexual activity: Not Currently  Other Topics Concern   Not on file  Social History Narrative   Not on file   Social Determinants of Health   Financial Resource Strain: Not on file  Food Insecurity: Not on file  Transportation Needs: Not on file  Physical Activity: Not on file  Stress: Not on file  Social Connections: Not on file  Intimate Partner Violence: Not on file    Family History  Family history unknown: Yes      Review of systems complete and found to be negative unless listed above    PHYSICAL EXAM General: Elderly appearing Caucasian male, well nourished, in no acute distress. Laying flat in PCU bed, resting comfortably .  HEENT:  Normocephalic and atraumatic. Neck:  No JVD.  Lungs: Normal respiratory effort on 2 L by nasal cannula. Clear bilaterally to auscultation. No wheezes, crackles, rhonchi.  Heart: Regular rate with frequent extra beats. Normal S1 and S2 without gallops or murmurs. Radial & DP pulses 2+ bilaterally. Abdomen: Non-distended appearing.  Msk: Normal strength and tone for age. Extremities: Warm and well perfused. No clubbing, cyanosis.  Trace bilateral LEE edema  Neuro: Alert and oriented X 3. Psych:  Answers questions appropriately.   Labs:   Lab Results  Component Value Date   WBC 18.3 (H) 06/11/2021   HGB 8.4 (L) 06/11/2021   HCT 26.2 (L) 06/11/2021   MCV 90.3 06/11/2021   PLT 109 (L) 06/11/2021    Recent Labs  Lab 06/11/21 0206  NA 140  K 3.9  CL 109  CO2 22  BUN 62*  CREATININE 3.87*  CALCIUM 8.0*  GLUCOSE 100*    Lab Results  Component Value Date   CKTOTAL 23 (L) 12/11/2013   CKMB 1.0 12/11/2013   TROPONINI 0.05 (HH) 12/21/2017     Lab Results  Component Value Date   CHOL 110 05/29/2021   CHOL 108 08/08/2016   CHOL 141 12/16/2011   Lab Results  Component Value Date   HDL 29 (L) 05/29/2021   HDL 36 (L) 08/08/2016   HDL 38 (L) 12/16/2011   Lab Results  Component Value Date    LDLCALC 59 05/29/2021   LDLCALC 50 08/08/2016   LDLCALC 83 12/16/2011   Lab Results  Component Value Date   TRIG 110 05/29/2021   TRIG 111 08/08/2016   TRIG 102 12/16/2011   Lab Results  Component Value Date   CHOLHDL 3.8 05/29/2021   CHOLHDL 3.0 08/08/2016   CHOLHDL 3.7 12/16/2011   No results found for: LDLDIRECT    Radiology: CT CHEST WO CONTRAST  Result Date: 05/28/2021 CLINICAL DATA:  Shortness of breath. History of congestive heart failure. Substantial smoking history. EXAM: CT CHEST WITHOUT CONTRAST TECHNIQUE: Multidetector CT imaging of the chest was performed following the standard protocol without IV contrast. RADIATION DOSE REDUCTION: This exam was performed according to the departmental dose-optimization program which includes automated exposure control, adjustment of the mA and/or kV according to patient size and/or use of iterative reconstruction technique. COMPARISON:  Chest radiograph 05/01/2021 and chest CT 04/12/2021 FINDINGS: Cardiovascular: Coronary, aortic arch, and branch vessel atherosclerotic vascular disease. Coronary stents. AICD noted. 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 Small anterior pericardial effusion. Borderline cardiomegaly. Descending thoracic aortic and upper abdominal aortic stent. Mediastinum/Nodes: AP window lymph node 0.8 cm in short axis on image 62 series 2, stable. Low-density subcarinal lymph nodes including a 1.0 cm node on image 89 series 2, formerly the same. Other scattered small mediastinal lymph nodes are present. Lungs/Pleura: Small bilateral pleural effusions although both are increased from 04/12/2021. Centrilobular emphysema. Mild secondary pulmonary lobular septal accentuation of the apices. Bilateral airway thickening. 1.0 by 0.6 cm stable ground-glass density nodule in the apicoposterior segment left upper lobe on image 22 series 3. Ground-glass density  peribronchovascular nodule in the left upper lobe on image 33 series 3 is stable at 1.2 by 0.7 cm. Ground-glass density 1.7 by 1.0 cm right lower lobe nodule on image 72 series 3. Increased density of a 0.8 by 0.5 cm right upper lobe sub solid pulmonary nodule on image 62 series 3 compared to the 04/12/2021 exam. Mildly increased  atelectasis in the left lower lobe compared to previous. Upper Abdomen: Small depending gallstones in the gallbladder. Upper abdominal aortic stent with stents in the proximal celiac trunk, SMA, and renal arteries. We partially image a ventral hernia containing adipose tissue on image 185 series 2. Musculoskeletal: Bilateral gynecomastia. 2.0 by 0.6 cm sclerotic lesion in the right posterolateral sixth rib, no change from 12/14/2011, considered benign. IMPRESSION: 1. Small but mildly worsened bilateral pleural effusions with associated passive atelectasis. 2. Airway thickening is present, suggesting bronchitis or reactive airways disease. 3. Several ground-glass density pulmonary nodules are present including two left apical nodule stable from 04/12/2021 (questionable precursors of these lesions on 12/14/2011) and a right upper lobe nodule which has increased in density compared to that time, as well as a stable right lower lobe ground-glass density pulmonary nodule. Low-grade adenocarcinoma is a possibility and surveillance is recommended. 4. Aortic Atherosclerosis (ICD10-I70.0) and Emphysema (ICD10-J43.9). Coronary atherosclerosis. 5. 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 Recommend semi-annual imaging followup by CTA or MRA and referral to cardiothoracic surgery if not already obtained. This recommendation follows 2010 ACCF/AHA/AATS/ACR/ASA/SCA/SCAI/SIR/STS/SVM Guidelines for the Diagnosis and Management of Patients With Thoracic Aortic Disease. Circulation. 2010; 121: O841-Y60. Aortic aneurysm NOS (ICD10-I71.9) 6. Coronary and abdominal aortic stents along with stents in the celiac trunk,  SMA, and proximal renal arteries. AICD noted. 7. Small anterior pericardial effusion. 8. Cholelithiasis. 9. Partially imaged ventral hernia containing adipose tissue. Electronically Signed   By: Van Clines M.D.   On: 05/28/2021 08:44   DG Chest Portable 1 View  Result Date: 06/10/2021 CLINICAL DATA:  81 year old male with shortness of breath and nausea. Former smoker. EXAM: PORTABLE CHEST 1 VIEW COMPARISON:  Portable chest 05/28/2021 and earlier. FINDINGS: Portable AP upright views at 0651 hours. Stable left chest cardiac AICD. Stable mild cardiomegaly and mediastinal contours. Visualized tracheal air column is within normal limits. Chronic increased pulmonary interstitial markings with mild basilar predominance on the right appears stable since January. No superimposed pneumothorax, pleural effusion, or consolidation. No acute osseous abnormality identified. Negative visible bowel gas. IMPRESSION: Stable portable appearance of the chest since January, with suspected chronic interstitial changes. No acute cardiopulmonary abnormality. Electronically Signed   By: Genevie Ann M.D.   On: 06/10/2021 07:45   DG Chest Portable 1 View  Result Date: 05/28/2021 CLINICAL DATA:  Shortness of breath, chest pain EXAM: PORTABLE CHEST 1 VIEW COMPARISON:  05/01/2021 FINDINGS: Lungs are clear.  No pleural effusion or pneumothorax. The heart is normal in size.  Left subclavian ICD. Mild degenerative changes of the mid thoracic spine. IMPRESSION: No evidence of acute cardiopulmonary disease. Electronically Signed   By: Julian Hy M.D.   On: 05/28/2021 19:04   DG UGI W SINGLE CM (SOL OR THIN BA)  Result Date: 05/30/2021 CLINICAL DATA:  Upper GI bleed. Evaluate for mucosal ulceration/defect. EXAM: DG UGI W SINGLE CM TECHNIQUE: Scout radiograph was obtained. Single contrast examination was performed using thin liquid barium. This exam was performed by Candiss Norse, PA, and was supervised and interpreted by  myself. FLUOROSCOPY TIME:  Radiation Exposure Index (as provided by the fluoroscopic device): 44.4 mGy COMPARISON:  Chest CT 05/27/2021.  Abdominopelvic CT 04/04/2021. FINDINGS: Scout Radiograph: Due to reported contrast allergy, the study was performed with thin barium after obtaining supine and erect views of the abdomen. The bowel gas pattern is nonobstructive. There is no evidence of pneumoperitoneum. Extensive postsurgical changes are noted, related to previous aortoiliac stent grafting. The patient has an AICD. Study was performed  in the supine and semi erect positions. Patient was unable to stand or lie prone. The patient swallowed the barium without difficulty. The esophageal motility appears within normal limits. There is no evidence of stricture, mass or ulceration. Mild gastric fold thickening is noted which could indicate gastritis. No evidence of mucosal ulceration. The duodenum appears normal. IMPRESSION: 1. No specific cause for upper GI bleeding identified. There is no evidence of mucosal ulceration. 2. Gastric fold thickening which could indicate gastritis. 3. No significant esophageal abnormality. Electronically Signed   By: Richardean Sale M.D.   On: 05/30/2021 13:12   ECHOCARDIOGRAM COMPLETE  Result Date: 05/29/2021    ECHOCARDIOGRAM REPORT   Patient Name:   DRAYK HUMBARGER Date of Exam: 05/29/2021 Medical Rec #:  [id number]        Height:       70.0 in Accession #:    [id number]       Weight:       204.0 lb Date of Birth:  [birth year]       BSA:          2.105 m Patient Age:    27 years         BP:           166/66 mmHg Patient Gender: M                HR:           62 bpm. Exam Location:  [facility] Procedure: 2D Echo, Color Doppler, Cardiac Doppler and Intracardiac            Opacification Agent Indications:     R07.9 Chest Pain  History:         Patient has prior history of Echocardiogram examinations, most                  recent 04/04/2021. CHF, CAD, Pacemaker; Risk Factors:HCL.  Sonographer:      Charmayne Sheer Referring Phys:  [place] Diagnosing Phys: Serafina Royals MD  Sonographer Comments: Technically difficult study due to poor echo windows. Image acquisition challenging due to respiratory motion. IMPRESSIONS  1. Left ventricular ejection fraction, by estimation, is 40 to 45%. The left ventricle has mildly decreased function. The left ventricle demonstrates global hypokinesis. The left ventricular internal cavity size was mildly dilated. Left ventricular diastolic parameters were normal.  2. Right ventricular systolic function is normal. The right ventricular size is normal.  3. Left atrial size was mildly dilated.  4. Right atrial size was mildly dilated.  5. The mitral valve is normal in structure. Mild to moderate mitral valve regurgitation.  6. Tricuspid valve regurgitation is mild to moderate.  7. The aortic valve is normal in structure. Aortic valve regurgitation is not visualized. FINDINGS  Left Ventricle: Left ventricular ejection fraction, by estimation, is 40 to 45%. The left ventricle has mildly decreased function. The left ventricle demonstrates global hypokinesis. Definity contrast agent was given IV to delineate the left ventricular  endocardial borders. The left ventricular internal cavity size was mildly dilated. There is no left ventricular hypertrophy. Left ventricular diastolic parameters were normal. Right Ventricle: The right ventricular size is normal. No increase in right ventricular wall thickness. Right ventricular systolic function is normal. Left Atrium: Left atrial size was mildly dilated. Right Atrium: Right atrial size was mildly dilated. Pericardium: There is no evidence of pericardial effusion. Mitral Valve: The mitral valve is normal in structure. Mild to moderate mitral valve regurgitation. MV peak  gradient, 6.6 mmHg. The mean mitral valve gradient is 4.0 mmHg. Tricuspid Valve: The tricuspid valve is normal in structure. Tricuspid valve regurgitation is mild  to moderate. Aortic Valve: The aortic valve is normal in structure. Aortic valve regurgitation is not visualized. Aortic valve mean gradient measures 11.0 mmHg. Aortic valve peak gradient measures 19.0 mmHg. Aortic valve area, by VTI measures 2.27 cm. Pulmonic Valve: The pulmonic valve was normal in structure. Pulmonic valve regurgitation is trivial. Aorta: The aortic root and ascending aorta are structurally normal, with no evidence of dilitation. IAS/Shunts: No atrial level shunt detected by color flow Doppler.  LEFT VENTRICLE PLAX 2D LVIDd:         5.23 cm   Diastology LVIDs:         4.07 cm   LV e' medial:    5.33 cm/s LV PW:         1.39 cm   LV E/e' medial:  18.0 LV IVS:        1.11 cm   LV e' lateral:   5.66 cm/s LVOT diam:     2.50 cm   LV E/e' lateral: 16.9 LV SV:         116 LV SV Index:   55 LVOT Area:     4.91 cm  RIGHT VENTRICLE RV Basal diam:  4.45 cm LEFT ATRIUM           Index        RIGHT ATRIUM           Index LA diam:      3.70 cm 1.76 cm/m   RA Area:     17.10 cm LA Vol (A4C): 73.3 ml 34.82 ml/m  RA Volume:   40.00 ml  19.00 ml/m  AORTIC VALVE                     PULMONIC VALVE AV Area (Vmax):    2.61 cm      PV Vmax:       0.87 m/s AV Area (Vmean):   2.33 cm      PV Vmean:      57.000 cm/s AV Area (VTI):     2.27 cm      PV VTI:        0.157 m AV Vmax:           218.00 cm/s   PV Peak grad:  3.0 mmHg AV Vmean:          160.000 cm/s  PV Mean grad:  2.0 mmHg AV VTI:            0.513 m AV Peak Grad:      19.0 mmHg AV Mean Grad:      11.0 mmHg LVOT Vmax:         116.00 cm/s LVOT Vmean:        75.800 cm/s LVOT VTI:          0.237 m LVOT/AV VTI ratio: 0.46  AORTA Ao Root diam: 3.40 cm MITRAL VALVE                TRICUSPID VALVE MV Area (PHT): 3.44 cm     TR Peak grad:   43.6 mmHg MV Area VTI:   3.32 cm     TR Vmax:        330.00 cm/s MV Peak grad:  6.6 mmHg MV Mean grad:  4.0 mmHg     SHUNTS MV Vmax:  1.28 m/s     Systemic VTI:  0.24 m MV Vmean:      93.5 cm/s    Systemic Diam: 2.50 cm  MV Decel Time: 221 msec MV E velocity: 95.75 cm/s MV A velocity: 106.00 cm/s MV E/A ratio:  0.90 Serafina Royals MD Electronically signed by Serafina Royals MD Signature Date/Time: 05/29/2021/12:28:40 PM    Final     ECHO 05/29/2021 LVEF 40-45% with global hypokinesis mild to moderate MR, mild to moderate TR  TELEMETRY reviewed by me: Sinus rhythm frequent PVCs, rate between 70 and 80  EKG reviewed by me: NSR rate 84, ventricular bigeminy, LBBB, not significantly changed from 05/28/2021  ASSESSMENT AND PLAN:  The patient is an 81 year old male with a past medical history significant for HFrEF ( 05/29/21 LVEF 40-45%), ischemic cardiomyopathy s/p CRT-D 09/2017, CAD s/p PCI x3 07/2016, history of thoracic aortic aneurysm s/p fenestrated repair 2015, hypertension, hypercholesterolemia, CKD 4, COPD with 3 hospital admissions for symptomatic anemia and acute on chronic CHF since 03/2021 who [facility] ED 05/29/2021 with shortness of breath.  Cardiology is consulted for further assistance and management.   #Dyspnea #elevated troponin likely 2/2 demand ischemia #hypertensive urgency #HFrEF (LVEF 40-45%, global hypo), ICM s/p CRT-D 05/2017 The patient presents with acute onset of shortness of breath preceded by upper abdominal pain, nausea without vomiting and a blood pressure reportedly of 222/100 by EMS.  He has not been taking Lasix, Entresto, Plavix since they were held at discharge on 2/22 from his last admission.  His BNP on admission is 1700, decreased from last admission at 2800.  Troponins are uptrending and hemoglobin was at his baseline of 10 but is decreasing to 8.4 and WBCs increasing 13.8-18.3. Will continue to trend troponins, elevation is likely d/t demand ischemia from elevated BP vs anemia in the setting of known CAD. No significant EKG changes and patient continues to denies chest pain.  -BP somewhat improved but still above goal. PRN hydralazine for SBP >160 -trend troponins until peak --  3184 this morning with repeats ordered. -continuous telemetry monitoring -repeat EKG with chest discomfort -Continue Coreg 6.25 twice daily, hydralazine 100 mg 3 times daily, amlodipine 4m once daily  -restart home lasix 470monce daily with close monitoring of his renal function, consider nephrology input but will defer to primary team.  -declines clonidine due to a "sensation he has when he gets MRI contrast" -Recommend conservative management form a cardiac standpoint for now d/t increasing leukocytosis and known CKD 4. He may need ischemic workup at some point, possibly during this hospitalization but will continue to defer now as he has no chest pain, per Dr. PaSaralyn Pilar#CAD s/p PCI x3 07/2016 -S/p 3251mspirin, Continue aspirin 81 mg daily. -defer heparin drip -continue to hold plavix for now.  -Continue isosorbide 38m74mily -Continue Lipitor 40mg56mly  #CKD 4 Renal function appears to be at his baseline with creatinine 3.76-EGFR 15. 06/04/2021 was 3.58, gfr 16.  #COPD On his home 2L he states he uses PRN for dyspena Recommend outpatient f/u with pulmonology   This patient's plan of care was discussed and created with Dr. AlexaIsaias Cowmanhe is in agreement.  Signed: Loise Esguerra Tristan Schroeder-C 06/11/2021, 8:46 AM [facility]

## 2021-06-11 NOTE — Progress Notes (Signed)
Progress Note    DANYL DEEMS  PYK:[id number] DOB: Feb 22, 1941  DOA: 06/10/2021 PCP: Tracie Harrier, MD      Brief Narrative:    Medical records reviewed and are as summarized below:  TATE ZAGAL is a 81 y.o. male with medical history significant of sCHF with EF 40-45%, CAD with multiple stent placement, hypertension, hyperlipidemia, asthma, as needed oxygen at home, GERD, depression, AAA repair, aortic arch aneurysm, former smoker (recently quit smoking), CKD-4, AICD placement. Patient was recently hospitalized from 2/15 - 2/22 due to chest pain and non-STEMI.  He also had black stool bowel movement, GI was consulted, EGD was done, which showed gastritis.  Patient's Plavix is on hold.    He presented to the hospital because of shortness of breath and cough.   He was admitted to the hospital for acute on chronic systolic CHF. Troponins were also significantly elevated but this was attributed to demand ischemia.  He did not complain of any chest pain.    Assessment/Plan:   Principal Problem:   Acute on chronic systolic CHF (congestive heart failure) (HCC) Active Problems:   CAD (coronary artery disease)   HTN (hypertension)   Hyperlipidemia   BPH (benign prostatic hyperplasia)   NSTEMI (non-ST elevated myocardial infarction) (HCC)   Normocytic anemia   HLD (hyperlipidemia)   CKD (chronic kidney disease), stage IV (HCC)   Chronic respiratory failure with hypoxia (HCC)   Body mass index is 27.89 kg/m.  Acute on chronic systolic CHF: IV Lasix has been switched to oral Lasix.  Monitor BMP, daily weight and urine output  CAD with recent NSTEMI on 05/28/2021, elevated troponins:   Latest Reference Range & Units 06/10/21 09:50 06/10/21 12:30 06/10/21 19:14 06/11/21 02:06 06/11/21 06:06 06/11/21 10:03 06/11/21 11:24  Troponin I (High Sensitivity) <18 ng/L 525 (HH) 858 (HH) 2,587 (HH) 3,025 (HH) 3,184 (HH) 3,038 (HH) 3,324 (HH)  (HH):  Elevated troponin suspected  to be due to demand ischemia per cardiologist.  Chronic hypoxic respiratory failure: He said he only uses oxygen as needed at home.  Oxygen saturation at rest on room air was 90% and 87% with ambulation.  Oxygen saturation was 94% on 2 L oxygen.  Leukocytosis: This is likely reactive.  Repeat CBC tomorrow  CKD stage IV: Slight elevation in creatinine but otherwise stable   Other comorbidities include hypertension, hyperlipidemia, BPH, chronic normocytic anemia    Diet Order             Diet 2 gram sodium Room service appropriate? Yes; Fluid consistency: Thin  Diet effective now                   Consultants: Cardiologist  Procedures: None    Medications:    amLODipine  10 mg Oral Daily   aspirin EC  81 mg Oral Daily   atorvastatin  40 mg Oral q1800   calcitRIOL  0.25 mcg Oral Daily   carvedilol  6.25 mg Oral BID WC   citalopram  20 mg Oral Daily   finasteride  5 mg Oral Daily   furosemide  40 mg Oral Daily   hydrALAZINE  100 mg Oral TID   isosorbide mononitrate  30 mg Oral Daily   pantoprazole  40 mg Oral Daily   tamsulosin  0.4 mg Oral Daily   umeclidinium bromide  1 puff Inhalation Daily   cyanocobalamin  1,000 mcg Oral Daily   Continuous Infusions:   Anti-infectives (From admission, onward)  None              Family Communication/Anticipated D/C date and plan/Code Status   DVT prophylaxis: SCDs Start: 06/10/21 0920     Code Status: Full Code  Family Communication: None Disposition Plan: Plan to discharge home in 1 to 2 days   Status is: Inpatient Remains inpatient appropriate because: Shortness of breath               Subjective:   Interval events noted.  He complains of shortness of breath with minimal exertion.  Objective:    Vitals:   06/11/21 0324 06/11/21 0352 06/11/21 0754 06/11/21 1115  BP: (!) 155/62  (!) 161/69 (!) 127/47  Pulse: 72  70 61  Resp: 20  20 16   Temp: 98.7 F (37.1 C)  98 F (36.7 C)  99.1 F (37.3 C)  TempSrc:    Oral  SpO2: 98%  97% 95%  Weight:  90.7 kg    Height:       No data found.   Intake/Output Summary (Last 24 hours) at 06/11/2021 1531 Last data filed at 06/11/2021 1000 Gross per 24 hour  Intake 360 ml  Output 700 ml  Net -340 ml   Filed Weights   06/10/21 0645 06/11/21 0352  Weight: 90.7 kg 90.7 kg    Exam:  GEN: NAD SKIN: Warm and dry EYES: EOMI ENT: MMM CV: RRR PULM: CTA B ABD: soft, ND, NT, +BS CNS: AAO x 3, non focal EXT: No edema or tenderness        Data Reviewed:   I have personally reviewed following labs and imaging studies:  Labs: Labs show the following:   Basic Metabolic Panel: Recent Labs  Lab 06/10/21 0700 06/11/21 0206  NA 139 140  K 3.8 3.9  CL 107 109  CO2 23 22  GLUCOSE 125* 100*  BUN 62* 62*  CREATININE 3.76* 3.87*  CALCIUM 8.3* 8.0*  MG  --  1.7   GFR Estimated Creatinine Clearance: 17.3 mL/min (A) (by C-G formula based on SCr of 3.87 mg/dL (H)). Liver Function Tests: No results for input(s): AST, ALT, ALKPHOS, BILITOT, PROT, ALBUMIN in the last 168 hours. No results for input(s): LIPASE, AMYLASE in the last 168 hours. No results for input(s): AMMONIA in the last 168 hours. Coagulation profile No results for input(s): INR, PROTIME in the last 168 hours.  CBC: Recent Labs  Lab 06/10/21 0700 06/11/21 0206  WBC 13.8* 18.3*  NEUTROABS 12.4*  --   HGB 10.0* 8.4*  HCT 31.3* 26.2*  MCV 92.3 90.3  PLT 142* 109*   Cardiac Enzymes: No results for input(s): CKTOTAL, CKMB, CKMBINDEX, TROPONINI in the last 168 hours. BNP (last 3 results) No results for input(s): PROBNP in the last 8760 hours. CBG: No results for input(s): GLUCAP in the last 168 hours. D-Dimer: No results for input(s): DDIMER in the last 72 hours. Hgb A1c: No results for input(s): HGBA1C in the last 72 hours. Lipid Profile: No results for input(s): CHOL, HDL, LDLCALC, TRIG, CHOLHDL, LDLDIRECT in the last 72 hours. Thyroid  function studies: No results for input(s): TSH, T4TOTAL, T3FREE, THYROIDAB in the last 72 hours.  Invalid input(s): FREET3 Anemia work up: No results for input(s): VITAMINB12, FOLATE, FERRITIN, TIBC, IRON, RETICCTPCT in the last 72 hours. Sepsis Labs: Recent Labs  Lab 06/10/21 0700 06/11/21 0206  PROCALCITON <0.10  --   WBC 13.8* 18.3*    Microbiology Recent Results (from the past 240 hour(s))  Resp Panel  by RT-PCR (Flu A&B, Covid) Nasopharyngeal Swab     Status: None   Collection Time: 06/10/21  7:00 AM   Specimen: Nasopharyngeal Swab; Nasopharyngeal(NP) swabs in vial transport medium  Result Value Ref Range Status   SARS Coronavirus 2 by RT PCR NEGATIVE NEGATIVE Final    Comment: (NOTE) SARS-CoV-2 target nucleic acids are NOT DETECTED.  The SARS-CoV-2 RNA is generally detectable in upper respiratory specimens during the acute phase of infection. The lowest concentration of SARS-CoV-2 viral copies this assay can detect is 138 copies/mL. A negative result does not preclude SARS-Cov-2 infection and should not be used as the sole basis for treatment or other patient management decisions. A negative result may occur with  improper specimen collection/handling, submission of specimen other than nasopharyngeal swab, presence of viral mutation(s) within the areas targeted by this assay, and inadequate number of viral copies(<138 copies/mL). A negative result must be combined with clinical observations, patient history, and epidemiological information. The expected result is Negative.  Fact Sheet for Patients:  EntrepreneurPulse.com.au  Fact Sheet for Healthcare Providers:  IncredibleEmployment.be  This test is no t yet approved or cleared by the [place] FDA and  has been authorized for detection and/or diagnosis of SARS-CoV-2 by FDA under an Emergency Use Authorization (EUA). This EUA will remain  in effect (meaning this test can be  used) for the duration of the COVID-19 declaration under Section 564(b)(1) of the Act, 21 U.S.C.section 360bbb-3(b)(1), unless the authorization is terminated  or revoked sooner.       Influenza A by PCR NEGATIVE NEGATIVE Final   Influenza B by PCR NEGATIVE NEGATIVE Final    Comment: (NOTE) The Xpert Xpress SARS-CoV-2/FLU/RSV plus assay is intended as an aid in the diagnosis of influenza from Nasopharyngeal swab specimens and should not be used as a sole basis for treatment. Nasal washings and aspirates are unacceptable for Xpert Xpress SARS-CoV-2/FLU/RSV testing.  Fact Sheet for Patients: EntrepreneurPulse.com.au  Fact Sheet for Healthcare Providers: IncredibleEmployment.be  This test is not yet approved or cleared by the [place] FDA and has been authorized for detection and/or diagnosis of SARS-CoV-2 by FDA under an Emergency Use Authorization (EUA). This EUA will remain in effect (meaning this test can be used) for the duration of the COVID-19 declaration under Section 564(b)(1) of the Act, 21 U.S.C. section 360bbb-3(b)(1), unless the authorization is terminated or revoked.  Performed at [facility], [address] [place], [place] [address]     Procedures and diagnostic studies:  DG Chest Portable 1 View  Result Date: 06/10/2021 CLINICAL DATA:  81 year old male with shortness of breath and nausea. Former smoker. EXAM: PORTABLE CHEST 1 VIEW COMPARISON:  Portable chest 05/28/2021 and earlier. FINDINGS: Portable AP upright views at 0651 hours. Stable left chest cardiac AICD. Stable mild cardiomegaly and mediastinal contours. Visualized tracheal air column is within normal limits. Chronic increased pulmonary interstitial markings with mild basilar predominance on the right appears stable since January. No superimposed pneumothorax, pleural effusion, or consolidation. No acute osseous abnormality identified. Negative visible  bowel gas. IMPRESSION: Stable portable appearance of the chest since January, with suspected chronic interstitial changes. No acute cardiopulmonary abnormality. Electronically Signed   By: Genevie Ann M.D.   On: 06/10/2021 07:45               LOS: 1 day   Clarabell Matsuoka  [facility]   Pager on [email or web address]. If 7PM-7AM, please contact night-coverage at [email or web address]     06/11/2021, 3:31 PM

## 2021-06-11 NOTE — Progress Notes (Signed)
SATURATION QUALIFICATIONS: (This note is used to comply with regulatory documentation for home oxygen) ? ?Patient Saturations on Room Air at Rest = 90% ? ?Patient Saturations on Room Air while Ambulating = 87% ? ?Patient Saturations on 2 Liters of oxygen while Ambulating = 94% ? ?Please briefly explain why patient needs home oxygen: ?

## 2021-06-11 NOTE — Progress Notes (Addendum)
Patient troponins trending up to 3,025. Patient is not c/o chest pain at this time. Neomia Glass NP aware, no new orders at this time.  ?

## 2021-06-11 NOTE — Evaluation (Addendum)
Physical Therapy Evaluation ?Patient Details ?Name: Peter [name] ?MRN: [id number] ?DOB: 1941/01/04 ?Today's Date: 06/11/2021 ? ?History of Present Illness ? Per MD note, Peter [name] is a 81 y.o. 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 Patient was recently hospitalized from 2/15 - 2/22 due to chest pain and non-STEMI. ?  ?Clinical Impression ? Pt admitted with above diagnosis. Pt received in bed resting on 2.5 L/min. States PLOF, DME, home lay out remains the same compared to previous admission. Maintains SPO2 >90% on RA prior to mobility and with mobility. Pt remains mod-I with bed mobility and stands to RW with supervision and good hand placement. Able to amb ~180' with RW with supervision and step through pattern. Minor Sob noted at end of bout with SPO2 at 95%. Pt in recliner post session with education provided on need for progressive mobility to maintain strength/mobility and independence in presence of multiple hospitalizations. Pt left in care of RN and RN student for medications. Pt currently with functional limitations due to the deficits listed below (see PT Problem List). Pt will benefit from skilled PT to increase their independence and safety with mobility to allow discharge to the venue listed below.     ? ?Recommendations for follow up therapy are one component of a multi-disciplinary discharge planning process, led by the attending physician.  Recommendations may be updated based on patient status, additional functional criteria and insurance authorization. ? ?Follow Up Recommendations Home health PT ? ?  ?Assistance Recommended at Discharge PRN  ?Patient can return home with the following ?   ? ?  ?Equipment Recommendations  None recommended by PT  ?Recommendations for Other Services ?    ?  ?Functional Status Assessment Patient has had a recent decline in their functional status and demonstrates the ability to make significant improvements in function in a reasonable and predictable amount of time.  ? ?  ?Precautions / Restrictions Precautions ?Precautions: Fall ?Restrictions ?Weight Bearing Restrictions: No  ? ?  ? ?Mobility ? Bed Mobility ?Overal bed mobility: Modified Independent ?  ?  ?  ?  ?  ?  ?  ?Patient Response: Cooperative ? ?Transfers ?Overall transfer level: Needs assistance ?Equipment used: Rolling walker (2 wheels) ?Transfers: Sit to/from Stand ?Sit to Stand: Supervision ?  ?  ?  ?  ?  ?General transfer comment: Safe hand placement today to RW ?  ? ?Ambulation/Gait ?Ambulation/Gait assistance: Supervision ?Gait Distance (Feet): 180 Feet ?Assistive device: Rolling walker (2 wheels) ?Gait Pattern/deviations: Step-through pattern ?  ?  ?  ?General Gait Details: Performed on RA with minor SOB throughout ? ?Stairs ?  ?  ?  ?  ?  ? ?Wheelchair Mobility ?  ? ?Modified Rankin (Stroke Patients Only) ?  ? ?  ? ?Balance Overall balance assessment: Needs assistance ?  ?Sitting balance-Leahy Scale: Good ?  ?  ?Standing balance support: Bilateral upper extremity supported ?Standing balance-Leahy Scale: Fair ?  ?  ?  ?  ?  ?  ?  ?  ?  ?  ?  ?  ?   ? ? ? ?Pertinent Vitals/Pain Pain Assessment ?Pain Assessment: No/denies pain  ? ? ?Home Living Family/patient expects to be discharged to:: Private residence ?Living Arrangements: Spouse/significant other ?Available Help at Discharge: Family;Friend(s);Available PRN/intermittently ?Type  of Home: House ?Home Access: Stairs to enter ?Entrance Stairs-Rails: Right ?Entrance Stairs-Number of Steps: 4 ?  ?Home Layout: One level ?Home Equipment: Shower seat;Grab bars - toilet;Other (comment);Rolling Walker (2 wheels) (shower seat; grab bars for toilet) ?   ?  ?Prior Function Prior Level of Function  : Independent/Modified Independent;Driving ?  ?  ?  ?  ?  ?  ?Mobility Comments: Ambulates without AD at baseline; recently participating with [facility] services immediately prior to admission ?  ?  ? ? ?Hand Dominance  ?   ? ?  ?Extremity/Trunk Assessment  ? Upper Extremity Assessment ?Upper Extremity Assessment: Overall WFL for tasks assessed ?  ? ?Lower Extremity Assessment ?Lower Extremity Assessment: Overall WFL for tasks assessed ?  ? ?Cervical / Trunk Assessment ?Cervical / Trunk Assessment: Normal  ?Communication  ? Communication: HOH  ?Cognition Arousal/Alertness: Awake/alert ?Behavior During Therapy: [facility] for tasks assessed/performed ?Overall Cognitive Status: Within Functional Limits for tasks assessed ?  ?  ?  ?  ?  ?  ?  ?  ?  ?  ?  ?  ?  ?  ?  ?  ?  ?  ?  ? ?  ?General Comments General comments (skin integrity, edema, etc.): Max HR up to 90 BPM post ambulation with spo2 AT 95% on RA post ambulation. ? ?  ?Exercises Other Exercises ?Other Exercises: Role of PT in acute setting, D/c recs, importance of progress mobility in presence of multiple hospitalizations  ? ?Assessment/Plan  ?  ?PT Assessment Patient needs continued PT services  ?PT Problem List Decreased activity tolerance;Decreased balance;Decreased mobility;Cardiopulmonary status limiting activity ? ?   ?  ?PT Treatment Interventions DME instruction;Stair training;Gait training;Functional mobility training;Therapeutic activities;Patient/family education;Balance training;Therapeutic exercise   ? ?PT Goals (Current goals can be found in the Care Plan section)  ?Acute Rehab PT Goals ?Patient Stated Goal: to improve breathing to prevent hospitalizations ?PT Goal Formulation: With patient ?Time For Goal Achievement: 06/25/21 ?Potential to Achieve Goals: Good ? ?  ?Frequency Min 2X/week ?  ? ? ?Co-evaluation   ?  ?  ?  ?  ? ? ?  ?AM-PAC PT "6 Clicks" Mobility  ?Outcome Measure Help needed turning from your back to your side while in a flat bed without using  bedrails?: None ?Help needed moving from lying on your back to sitting on the side of a flat bed without using bedrails?: None ?Help needed moving to and from a bed to a chair (including a wheelchair)?: None ?Help needed standing up from a chair using your arms (e.g., wheelchair or bedside chair)?: A Little ?Help needed to walk in hospital room?: A Little ?Help needed climbing 3-5 steps with a railing? : A Little ?6 Click Score: 21 ? ?  ?End of Session Equipment Utilized During Treatment: Gait belt ?Activity Tolerance: Patient tolerated treatment well ?Patient left: in chair;with call bell/phone within reach;with nursing/sitter in room ?Nurse Communication: Mobility status ?PT Visit Diagnosis: Muscle weakness (generalized) (M62.81);Difficulty in walking, not elsewhere classified (R26.2) ?  ? ?Time: 2836-6294 ?PT Time Calculation (min) (ACUTE ONLY): 20 min ? ? ?Charges:   PT Evaluation ?$PT Eval Low Complexity: 1 Low ?PT Treatments ?$Gait Training: 8-22 mins ?  ?   ? ? ?Salem Caster. Fairly IV, PT, DPT ?Physical Therapist- [place]  ?[facility]  ?06/11/2021, 11:05 AM ? ?

## 2021-06-11 NOTE — Progress Notes (Signed)
OT Cancellation Note ? ?Patient Details ?Name: Peter [name] ?MRN: [id number] ?DOB: [birth year] ? ? ?Cancelled Treatment:    Reason Eval/Treat Not Completed: OT screened, no needs identified, will sign off. Order received, chart reviewed. Per PT and conversation with pt he is at baseline functional independence. No skilled OT needs identified. Will sign off. Please re-consult if additional needs arise.  ? ?Dessie Coma, M.S. OTR/L  ?06/11/21, 11:17 AM  ?ascom [phone] ? ?

## 2021-06-12 DIAGNOSIS — N184 Chronic kidney disease, stage 4 (severe): Secondary | ICD-10-CM | POA: Diagnosis not present

## 2021-06-12 DIAGNOSIS — I5023 Acute on chronic systolic (congestive) heart failure: Secondary | ICD-10-CM | POA: Diagnosis not present

## 2021-06-12 LAB — BASIC METABOLIC PANEL
Anion gap: 8 (ref 5–15)
BUN: 73 mg/dL — ABNORMAL HIGH (ref 8–23)
CO2: 21 mmol/L — ABNORMAL LOW (ref 22–32)
Calcium: 7.9 mg/dL — ABNORMAL LOW (ref 8.9–10.3)
Chloride: 106 mmol/L (ref 98–111)
Creatinine, Ser: 4.54 mg/dL — ABNORMAL HIGH (ref 0.61–1.24)
GFR, Estimated: 12 mL/min — ABNORMAL LOW (ref >60)
Glucose, Bld: 106 mg/dL — ABNORMAL HIGH (ref 70–99)
Potassium: 3.9 mmol/L (ref 3.5–5.1)
Sodium: 135 mmol/L (ref 135–145)

## 2021-06-12 LAB — HEMOGLOBIN A1C
Hgb A1c MFr Bld: 5.2 % (ref 4.8–5.6)
Mean Plasma Glucose: 103 mg/dL

## 2021-06-12 LAB — CBC WITH DIFFERENTIAL/PLATELET
Abs Immature Granulocytes: 0.13 10*3/uL — ABNORMAL HIGH (ref 0.00–0.07)
Basophils Absolute: 0 10*3/uL (ref 0.0–0.1)
Basophils Relative: 0 %
Eosinophils Absolute: 0 10*3/uL (ref 0.0–0.5)
Eosinophils Relative: 0 %
HCT: 27.1 % — ABNORMAL LOW (ref 39.0–52.0)
Hemoglobin: 8.8 g/dL — ABNORMAL LOW (ref 13.0–17.0)
Immature Granulocytes: 1 %
Lymphocytes Relative: 5 %
Lymphs Abs: 0.7 10*3/uL (ref 0.7–4.0)
MCH: 29.1 pg (ref 26.0–34.0)
MCHC: 32.5 g/dL (ref 30.0–36.0)
MCV: 89.7 fL (ref 80.0–100.0)
Monocytes Absolute: 1.5 10*3/uL — ABNORMAL HIGH (ref 0.1–1.0)
Monocytes Relative: 11 %
Neutro Abs: 11.3 10*3/uL — ABNORMAL HIGH (ref 1.7–7.7)
Neutrophils Relative %: 83 %
Platelets: 114 10*3/uL — ABNORMAL LOW (ref 150–400)
RBC: 3.02 MIL/uL — ABNORMAL LOW (ref 4.22–5.81)
RDW: 13.7 % (ref 11.5–15.5)
WBC: 13.6 10*3/uL — ABNORMAL HIGH (ref 4.0–10.5)
nRBC: 0 % (ref 0.0–0.2)

## 2021-06-12 LAB — TROPONIN I (HIGH SENSITIVITY)
Troponin I (High Sensitivity): 2514 ng/L (ref <18)
Troponin I (High Sensitivity): 2434 ng/L (ref <18)

## 2021-06-12 MED ORDER — ISOSORBIDE MONONITRATE ER 60 MG PO TB24
60.0000 mg | ORAL_TABLET | Freq: Every day | ORAL | Status: DC
  Administered 2021-06-13 – 2021-06-24 (×12): 60 mg via ORAL
  Filled 2021-06-12 (×12): qty 1

## 2021-06-12 NOTE — Progress Notes (Signed)
Physical Therapy Treatment ?Patient Details ?Name: Peter [name] ?MRN: [id number] ?DOB: 04-30-1940 ?Today's Date: 06/12/2021 ? ? ?History of Present Illness Per MD note, Peter [name] is a 81 y.o. 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 Patient was recently hospitalized from 2/15 - 2/22 due to chest pain and non-STEMI. ? ?  ?PT Comments  ? ? Pt is making good progress towards goals. Able to ambulate around hallway with safe technique and use of RW. Does become SOB with exertion and requests to return back to bed post gait. 10' trialed without AD with overall good balance, however decreased speed. Encouraged to continue to use RW with all mobility. Pt ambulated on 2L of O2 with safe technique.   ?Recommendations for follow up therapy are one component of a multi-disciplinary discharge planning process, led by the attending physician.  Recommendations may be updated based on patient status, additional functional criteria and insurance authorization. ? ?Follow Up Recommendations ? Home health PT ?  ?  ?Assistance Recommended at Discharge PRN  ?Patient can return home with the following   ?  ?Equipment Recommendations ? None recommended by PT  ?  ?Recommendations for Other Services   ? ? ?  ?Precautions / Restrictions Precautions ?Precautions: Fall ?Restrictions ?Weight Bearing Restrictions: No  ?  ? ?Mobility ? Bed Mobility ?Overal bed mobility: Modified Independent ?  ?  ?  ?  ?  ?  ?General bed mobility comments: safe technique with ease of mobility ?  ? ?Transfers ?Overall transfer level: Modified independent ?Equipment used: Rolling walker (2 wheels) ?Transfers: Sit to/from Stand ?Sit to Stand: Modified independent (Device/Increase time) ?  ?  ?   ?  ?  ?General transfer comment: safe technique, no cues for hand placement and ease of mobility ?  ? ?Ambulation/Gait ?Ambulation/Gait assistance: Supervision ?Gait Distance (Feet): 200 Feet ?Assistive device: Rolling walker (2 wheels) ?Gait Pattern/deviations: Step-through pattern ?  ?  ?  ?General Gait Details: ambulated on 2L of O2 with sats WNL. Does demonstrate SOB symptoms with exertion with heavy WOB noted. No LOB and 10' trialed without AD. Pt reports more security with RW ? ? ?Stairs ?  ?  ?  ?  ?  ? ? ?Wheelchair Mobility ?  ? ?Modified Rankin (Stroke Patients Only) ?  ? ? ?  ?Balance Overall balance assessment: Needs assistance ?Sitting-balance support: No upper extremity supported, Feet supported ?Sitting balance-Leahy Scale: Good ?  ?  ?Standing balance support: Bilateral upper extremity supported ?Standing balance-Leahy Scale: Good ?  ?  ?  ?  ?  ?  ?  ?  ?  ?  ?  ?  ?  ? ?  ?Cognition Arousal/Alertness: Awake/alert ?Behavior During Therapy: [facility] for tasks assessed/performed ?Overall Cognitive Status: Within Functional Limits for tasks assessed ?  ?  ?  ?  ?  ?  ?  ?  ?  ?  ?  ?  ?  ?  ?  ?  ?  ?  ?  ? ?  ?Exercises   ? ?  ?General Comments   ?  ?  ? ?Pertinent Vitals/Pain Pain Assessment ?Pain Assessment: No/denies pain  ? ? ?Home Living   ?  ?  ?  ?  ?  ?  ?  ?  ?  ?   ?  ?  Prior Function    ?  ?  ?   ? ?PT Goals (current goals can now be found in the care plan section) Acute Rehab PT Goals ?Patient Stated Goal: to improve breathing to prevent hospitalizations ?PT Goal Formulation: With patient ?Time For Goal Achievement: 06/25/21 ?Potential to Achieve Goals: Good ?Progress towards PT goals: Progressing toward goals ? ?  ?Frequency ? ? ? Min 2X/week ? ? ? ?  ?PT Plan Current plan remains appropriate  ? ? ?Co-evaluation   ?  ?  ?  ?  ? ?  ?AM-PAC PT "6 Clicks" Mobility   ?Outcome Measure ? Help needed turning from your back to your side while in a flat bed without using bedrails?: None ?Help needed  moving from lying on your back to sitting on the side of a flat bed without using bedrails?: None ?Help needed moving to and from a bed to a chair (including a wheelchair)?: None ?Help needed standing up from a chair using your arms (e.g., wheelchair or bedside chair)?: A Little ?Help needed to walk in hospital room?: A Little ?Help needed climbing 3-5 steps with a railing? : A Little ?6 Click Score: 21 ? ?  ?End of Session Equipment Utilized During Treatment: Gait belt;Oxygen ?Activity Tolerance: Patient tolerated treatment well ?Patient left: in bed ?Nurse Communication: Mobility status ?PT Visit Diagnosis: Muscle weakness (generalized) (M62.81);Difficulty in walking, not elsewhere classified (R26.2) ?  ? ? ?Time: 1281-1886 ?PT Time Calculation (min) (ACUTE ONLY): 11 min ? ?Charges:  $Gait Training: 8-22 mins          ?          ? ?Greggory Stallion, PT, DPT, GCS ?[phone] ? ? ? ?Peter [name] ?06/12/2021, 3:36 PM ? ?

## 2021-06-12 NOTE — Progress Notes (Signed)
Progress Note    Peter [name]  ENI:[id number] DOB: 1940-06-22  DOA: 06/10/2021 PCP: Tracie Harrier, MD      Brief Narrative:    Medical records reviewed and are as summarized below:  Peter [name] is a 81 y.o. male with medical history significant of sCHF with EF 40-45%, CAD with multiple stent placement, hypertension, hyperlipidemia, asthma, as needed oxygen at home, GERD, depression, AAA repair, aortic arch aneurysm, former smoker (recently quit smoking), CKD-4, AICD placement. Patient was recently hospitalized from 2/15 - 2/22 due to chest pain and non-STEMI.  He also had black stool bowel movement, GI was consulted, EGD was done, which showed gastritis.  Patient's Plavix is on hold.    He presented to the hospital because of shortness of breath and cough.   He was admitted to the hospital for acute on chronic systolic CHF. Troponins were also significantly elevated but this was attributed to demand ischemia.  He did not complain of any chest pain.    Assessment/Plan:   Principal Problem:   Acute on chronic systolic CHF (congestive heart failure) (HCC) Active Problems:   CAD (coronary artery disease)   HTN (hypertension)   Hyperlipidemia   BPH (benign prostatic hyperplasia)   NSTEMI (non-ST elevated myocardial infarction) (HCC)   Normocytic anemia   HLD (hyperlipidemia)   CKD (chronic kidney disease), stage IV (HCC)   Chronic respiratory failure with hypoxia (HCC)   Body mass index is 28.41 kg/m.  Acute on chronic systolic CHF: Lasix has been discontinued because of increasing creatinine.  Monitor BMP, daily weight and urine output  CAD with recent NSTEMI on 05/28/2021, elevated troponins:   Latest Reference Range & Units 06/10/21 09:50 06/10/21 12:30 06/10/21 19:14 06/11/21 02:06 06/11/21 06:06 06/11/21 10:03 06/11/21 11:24  Troponin I (High Sensitivity) <18 ng/L 525 (HH) 858 (HH) 2,587 (HH) 3,025 (HH) 3,184 (HH) 3,038 (HH) 3,324 (HH)  (HH):  Elevated  troponin suspected to be due to demand ischemia per cardiologist.  Chronic hypoxic respiratory failure: He said he only uses oxygen as needed at home.  Oxygen saturation at rest on room air was 90% and 87% with ambulation.  Oxygen saturation was 94% on 2 L oxygen.  Leukocytosis: Improving.  This is likely reactive.  Repeat CBC tomorrow  AKI on CKD stage IV: Lasix has been discontinued.  Encouraged adequate oral hydration.  Monitor BMP.  Other comorbidities include hypertension, hyperlipidemia, BPH, chronic normocytic anemia    Diet Order             Diet 2 gram sodium Room service appropriate? Yes; Fluid consistency: Thin  Diet effective now                   Consultants: Cardiologist  Procedures: None    Medications:    amLODipine  10 mg Oral Daily   aspirin EC  81 mg Oral Daily   atorvastatin  40 mg Oral q1800   calcitRIOL  0.25 mcg Oral Daily   carvedilol  6.25 mg Oral BID WC   citalopram  20 mg Oral Daily   finasteride  5 mg Oral Daily   hydrALAZINE  100 mg Oral TID   [START ON 06/13/2021] isosorbide mononitrate  60 mg Oral Daily   pantoprazole  40 mg Oral Daily   tamsulosin  0.4 mg Oral Daily   umeclidinium bromide  1 puff Inhalation Daily   cyanocobalamin  1,000 mcg Oral Daily   Continuous Infusions:   Anti-infectives (From admission,  onward)    None              Family Communication/Anticipated D/C date and plan/Code Status   DVT prophylaxis: SCDs Start: 06/10/21 0920     Code Status: Full Code  Family Communication: None Disposition Plan: Plan to discharge home in 1 to 2 days   Status is: Inpatient Remains inpatient appropriate because: Shortness of breath               Subjective:   No chest pain.  He still has shortness of breath with minimal exertion.  Objective:    Vitals:   06/12/21 0500 06/12/21 0825 06/12/21 1131 06/12/21 1527  BP:  (!) 167/49 (!) 136/97 (!) 141/36  Pulse:  66 (!) 59 66  Resp:  18 18  18   Temp:  97.8 F (36.6 C) 97.8 F (36.6 C) 97.7 F (36.5 C)  TempSrc:      SpO2:  97% 98% 99%  Weight: 92.4 kg     Height:       No data found.   Intake/Output Summary (Last 24 hours) at 06/12/2021 1704 Last data filed at 06/12/2021 1527 Gross per 24 hour  Intake --  Output 250 ml  Net -250 ml   Filed Weights   06/10/21 0645 06/11/21 0352 06/12/21 0500  Weight: 90.7 kg 90.7 kg 92.4 kg    Exam:  GEN: NAD SKIN: No rash EYES: EOMI ENT: MMM CV: RRR PULM: CTA B ABD: soft, ND, NT, +BS CNS: AAO x 3, non focal EXT: No edema or tenderness         Data Reviewed:   I have personally reviewed following labs and imaging studies:  Labs: Labs show the following:   Basic Metabolic Panel: Recent Labs  Lab 06/10/21 0700 06/11/21 0206 06/12/21 0806  NA 139 140 135  K 3.8 3.9 3.9  CL 107 109 106  CO2 23 22 21*  GLUCOSE 125* 100* 106*  BUN 62* 62* 73*  CREATININE 3.76* 3.87* 4.54*  CALCIUM 8.3* 8.0* 7.9*  MG  --  1.7  --    GFR Estimated Creatinine Clearance: 14.8 mL/min (A) (by C-G formula based on SCr of 4.54 mg/dL (H)). Liver Function Tests: No results for input(s): AST, ALT, ALKPHOS, BILITOT, PROT, ALBUMIN in the last 168 hours. No results for input(s): LIPASE, AMYLASE in the last 168 hours. No results for input(s): AMMONIA in the last 168 hours. Coagulation profile No results for input(s): INR, PROTIME in the last 168 hours.  CBC: Recent Labs  Lab 06/10/21 0700 06/11/21 0206 06/12/21 0806  WBC 13.8* 18.3* 13.6*  NEUTROABS 12.4*  --  11.3*  HGB 10.0* 8.4* 8.8*  HCT 31.3* 26.2* 27.1*  MCV 92.3 90.3 89.7  PLT 142* 109* 114*   Cardiac Enzymes: No results for input(s): CKTOTAL, CKMB, CKMBINDEX, TROPONINI in the last 168 hours. BNP (last 3 results) No results for input(s): PROBNP in the last 8760 hours. CBG: No results for input(s): GLUCAP in the last 168 hours. D-Dimer: No results for input(s): DDIMER in the last 72 hours. Hgb A1c: Recent Labs     06/10/21 1914  HGBA1C 5.2   Lipid Profile: No results for input(s): CHOL, HDL, LDLCALC, TRIG, CHOLHDL, LDLDIRECT in the last 72 hours. Thyroid function studies: No results for input(s): TSH, T4TOTAL, T3FREE, THYROIDAB in the last 72 hours.  Invalid input(s): FREET3 Anemia work up: No results for input(s): VITAMINB12, FOLATE, FERRITIN, TIBC, IRON, RETICCTPCT in the last 72 hours. Sepsis Labs: Recent  Labs  Lab 06/10/21 0700 06/11/21 0206 06/12/21 0806  PROCALCITON <0.10  --   --   WBC 13.8* 18.3* 13.6*    Microbiology Recent Results (from the past 240 hour(s))  Resp Panel by RT-PCR (Flu A&B, Covid) Nasopharyngeal Swab     Status: None   Collection Time: 06/10/21  7:00 AM   Specimen: Nasopharyngeal Swab; Nasopharyngeal(NP) swabs in vial transport medium  Result Value Ref Range Status   SARS Coronavirus 2 by RT PCR NEGATIVE NEGATIVE Final    Comment: (NOTE) SARS-CoV-2 target nucleic acids are NOT DETECTED.  The SARS-CoV-2 RNA is generally detectable in upper respiratory specimens during the acute phase of infection. The lowest concentration of SARS-CoV-2 viral copies this assay can detect is 138 copies/mL. A negative result does not preclude SARS-Cov-2 infection and should not be used as the sole basis for treatment or other patient management decisions. A negative result may occur with  improper specimen collection/handling, submission of specimen other than nasopharyngeal swab, presence of viral mutation(s) within the areas targeted by this assay, and inadequate number of viral copies(<138 copies/mL). A negative result must be combined with clinical observations, patient history, and epidemiological information. The expected result is Negative.  Fact Sheet for Patients:  EntrepreneurPulse.com.au  Fact Sheet for Healthcare Providers:  IncredibleEmployment.be  This test is no t yet approved or cleared by the [place] FDA and   has been authorized for detection and/or diagnosis of SARS-CoV-2 by FDA under an Emergency Use Authorization (EUA). This EUA will remain  in effect (meaning this test can be used) for the duration of the COVID-19 declaration under Section 564(b)(1) of the Act, 21 U.S.C.section 360bbb-3(b)(1), unless the authorization is terminated  or revoked sooner.       Influenza A by PCR NEGATIVE NEGATIVE Final   Influenza B by PCR NEGATIVE NEGATIVE Final    Comment: (NOTE) The Xpert Xpress SARS-CoV-2/FLU/RSV plus assay is intended as an aid in the diagnosis of influenza from Nasopharyngeal swab specimens and should not be used as a sole basis for treatment. Nasal washings and aspirates are unacceptable for Xpert Xpress SARS-CoV-2/FLU/RSV testing.  Fact Sheet for Patients: EntrepreneurPulse.com.au  Fact Sheet for Healthcare Providers: IncredibleEmployment.be  This test is not yet approved or cleared by the [place] FDA and has been authorized for detection and/or diagnosis of SARS-CoV-2 by FDA under an Emergency Use Authorization (EUA). This EUA will remain in effect (meaning this test can be used) for the duration of the COVID-19 declaration under Section 564(b)(1) of the Act, 21 U.S.C. section 360bbb-3(b)(1), unless the authorization is terminated or revoked.  Performed at [facility], [address] [place], [place] [address]     Procedures and diagnostic studies:  No results found.             LOS: 2 days   Caili Escalera  [facility]   Pager on [email or web address]. If 7PM-7AM, please contact night-coverage at [email or web address]     06/12/2021, 5:04 PM

## 2021-06-12 NOTE — Progress Notes (Signed)
Mobility Specialist - Progress Note ? ? 06/12/21 1600  ?Mobility  ?Activity Refused mobility  ? ? ? ?Pt politely declined mobility; states he just finished ambulating for 2nd time today and would like to rest for remainder of day. Will attempt session another date.  ? ? ?Kathee Delton ?Mobility Specialist ?06/12/21, 4:03 PM ? ? ? ? ? ?

## 2021-06-12 NOTE — Progress Notes (Signed)
[place] NOTE       Patient ID: BETTIE SWAVELY MRN: [id number] DOB/AGE: 82/07/1940 82 y.o.  Admit date: 06/10/2021 Referring Physician Dr. Lavonia Drafts Primary Physician Mortimer Fries, PA-C Primary Cardiologist Dr. Clayborn Bigness Reason for Consultation SOB, chest discomfort  HPI: The patient is an 81 year old male with a past medical history significant for HFrEF ( 05/29/21 LVEF 40-45%), ischemic cardiomyopathy s/p CRT-D 09/2017, CAD s/p PCI x3 07/2016, history of thoracic aortic aneurysm s/p fenestrated repair 2015, hypertension, hypercholesterolemia, CKD 4, COPD with 3 hospital admissions for symptomatic anemia and acute on chronic CHF since 03/2021 who presented to [facility] ED 05/29/2021 with shortness of breath.  Cardiology is consulted for further assistance and management.   Interval history: -continues to deny chest pain or discomfort, says he was significantly SOB while eating bacon this morning and feels fatigued today -renal function worsening with GFR down to 12 today. Troponin peaked at 3,300 yesterday. -has not been OOB much, requesting to see his pulmonologist  Review of systems complete and found to be negative unless listed above     Past Medical History:  Diagnosis Date   Anginal pain ([place])    Asthma    Bladder infection, acute 03/2011   "had a whole lot of bleeding from this"   CHF (congestive heart failure) ([place])    Coronary artery disease    High cholesterol    Hypertension    Macular degeneration 12/14/2011   "had it in my right; getting shots now in my left"   Myocardial infarction [facility]) 1996   NSTEMI (non-ST elevated myocardial infarction) ([place]) 12/14/2011   Shortness of breath 12/14/2011   "@ rest, lying down, w/exertion"    Past Surgical History:  Procedure Laterality Date   [place]; ~ 2003; ~ 2008   "total of 3"   CORONARY STENT INTERVENTION N/A 08/10/2016   Procedure: Coronary Stent Intervention;   Surgeon: [name], MD;  Location: [place] CV LAB;  Service: Cardiovascular;  Laterality: N/A;   HERNIA REPAIR  ~ 2001   "abdominal w/mesh implanted"   LEFT HEART CATH AND CORONARY ANGIOGRAPHY N/A 08/10/2016   Procedure: Left Heart Cath and Coronary Angiography;  Surgeon: [name], MD;  Location: [place] CV LAB;  Service: Cardiovascular;  Laterality: N/A;   LEFT HEART CATHETERIZATION WITH CORONARY ANGIOGRAM N/A 12/16/2011   Procedure: LEFT HEART CATHETERIZATION WITH CORONARY ANGIOGRAM;  Surgeon: Minus Breeding, MD;  Location: [facility] C[facility] LAB;  Service: Cardiovascular;  Laterality: N/A;   PERCUTANEOUS CORONARY STENT INTERVENTION (PCI-S) N/A 12/17/2011   Procedure: PERCUTANEOUS CORONARY STENT INTERVENTION (PCI-S);  Surgeon: Sherren Mocha, MD;  Location: [facility] CATH LAB;  Service: Cardiovascular;  Laterality: N/A;   TONSILLECTOMY     "I was a kid"    Medications Prior to Admission  Medication Sig Dispense Refill Last Dose   amLODipine (NORVASC) 10 MG tablet Take 1 tablet (10 mg total) by mouth daily. 30 tablet 2 06/09/2021 at 0800   atorvastatin (LIPITOR) 40 MG tablet Take 1 tablet (40 mg total) by mouth daily at 6 PM. 30 tablet 0 06/10/2021 at 0300   calcitRIOL (ROCALTROL) 0.25 MCG capsule Take 0.25 mcg by mouth daily.   06/09/2021 at unknown   carvedilol (COREG) 6.25 MG tablet Take 1 tablet (6.25 mg total) by mouth 2 (two) times daily with a meal. 60 tablet 2 06/10/2021 at 0300   cetirizine (ZYRTEC) 10 MG tablet Take 10 mg by mouth.  citalopram (CELEXA) 20 MG tablet Take 20 mg by mouth daily.   06/09/2021 at 0800   clobetasol cream (TEMOVATE) 1.61 % Apply 1 application topically 2 (two) times daily as needed (psoriasis).  1  at unknown prn   cyanocobalamin 1000 MCG tablet Take 1,000 mcg by mouth daily.       finasteride (PROSCAR) 5 MG tablet Take 1 tablet (5 mg total) by mouth daily. 30 tablet 2 06/09/2021 at 0800   GLUCOSAMINE-CHONDROITIN-VIT C PO Take 1 tablet by mouth  daily.      hydrALAZINE (APRESOLINE) 100 MG tablet Take 1 tablet (100 mg total) by mouth 3 (three) times daily. 90 tablet 2 06/10/2021 at 0300   isosorbide mononitrate (IMDUR) 30 MG 24 hr tablet Take 1 tablet (30 mg total) by mouth daily. 30 tablet 2 06/09/2021 at 0800   omeprazole (PRILOSEC) 20 MG capsule Take 20 mg by mouth in the morning.   06/09/2021 at 0800   tamsulosin (FLOMAX) 0.4 MG CAPS capsule Take 1 capsule (0.4 mg total) by mouth daily. 30 capsule 2 06/09/2021 at 0800   umeclidinium bromide (INCRUSE ELLIPTA) 62.5 MCG/ACT AEPB Inhale 1 puff into the lungs daily. 30 each 2 06/10/2021 at 0600   cloNIDine (CATAPRES) 0.1 MG tablet Take 1 tablet (0.1 mg total) by mouth 2 (two) times daily. (Patient not taking: Reported on 06/05/2021) 60 tablet 2 Not Taking   clopidogrel (PLAVIX) 75 MG tablet Hold until outpatient followup with cardiology due to worsening anemia. (Patient not taking: Reported on 06/05/2021) 30 tablet 1    furosemide (LASIX) 40 MG tablet Hold until outpatient followup with cardiology. (Patient not taking: Reported on 06/05/2021) 30 tablet     spironolactone (ALDACTONE) 25 MG tablet Hold until followup with your outpatient doctor due to worsening kidney function. (Patient not taking: Reported on 06/05/2021)       Social History   Socioeconomic History   Marital status: Widowed    Spouse name: Not on file   Number of children: Not on file   Years of education: Not on file   Highest education level: Not on file  Occupational History   Not on file  Tobacco Use   Smoking status: Former    Packs/day: 0.50    Years: 0.50    Pack years: 0.25    Types: Cigarettes   Smokeless tobacco: Never   Tobacco comments:    11/23/16 Still not ready to quit smoking.  Substance and Sexual Activity   Alcohol use: Yes    Alcohol/week: 1.0 standard drink    Types: 1 Cans of beer per week    Comment: 12/14/2011 "if my kidneys are sore, I drink 1 beer/day; if not sore; no beer; occasionally drink  socially; ave 1 beer/wk maybe"   Drug use: No   Sexual activity: Not Currently  Other Topics Concern   Not on file  Social History Narrative   Not on file   Social Determinants of Health   Financial Resource Strain: Not on file  Food Insecurity: Not on file  Transportation Needs: Not on file  Physical Activity: Not on file  Stress: Not on file  Social Connections: Not on file  Intimate Partner Violence: Not on file    Family History  Family history unknown: Yes      Review of systems complete and found to be negative unless listed above    PHYSICAL EXAM General: Elderly appearing Caucasian male, well nourished, in no acute distress. Sitting at incline in PCU bed.  HEENT:  Normocephalic and atraumatic. Neck:  No JVD.  Lungs: Normal respiratory effort on 2 L by nasal cannula. Clear bilaterally to auscultation. No wheezes, crackles, rhonchi.  Heart: Regular rate with frequent extra beats. Normal S1 and S2 without gallops or murmurs. Radial & DP pulses 2+ bilaterally. Abdomen: Non-distended appearing.  Msk: Normal strength and tone for age. Extremities: Warm and well perfused. No clubbing, cyanosis.  Trace bilateral LEE edema  Neuro: Alert and oriented X 3. Psych:  Answers questions appropriately.   Labs:   Lab Results  Component Value Date   WBC 13.6 (H) 06/12/2021   HGB 8.8 (L) 06/12/2021   HCT 27.1 (L) 06/12/2021   MCV 89.7 06/12/2021   PLT 114 (L) 06/12/2021    Recent Labs  Lab 06/12/21 0806  NA 135  K 3.9  CL 106  CO2 21*  BUN 73*  CREATININE 4.54*  CALCIUM 7.9*  GLUCOSE 106*    Lab Results  Component Value Date   CKTOTAL 23 (L) 12/11/2013   CKMB 1.0 12/11/2013   TROPONINI 0.05 (HH) 12/21/2017     Lab Results  Component Value Date   CHOL 110 05/29/2021   CHOL 108 08/08/2016   CHOL 141 12/16/2011   Lab Results  Component Value Date   HDL 29 (L) 05/29/2021   HDL 36 (L) 08/08/2016   HDL 38 (L) 12/16/2011   Lab Results  Component Value Date    LDLCALC 59 05/29/2021   LDLCALC 50 08/08/2016   LDLCALC 83 12/16/2011   Lab Results  Component Value Date   TRIG 110 05/29/2021   TRIG 111 08/08/2016   TRIG 102 12/16/2011   Lab Results  Component Value Date   CHOLHDL 3.8 05/29/2021   CHOLHDL 3.0 08/08/2016   CHOLHDL 3.7 12/16/2011   No results found for: LDLDIRECT    Radiology: CT CHEST WO CONTRAST  Result Date: 05/28/2021 CLINICAL DATA:  Shortness of breath. History of congestive heart failure. Substantial smoking history. EXAM: CT CHEST WITHOUT CONTRAST TECHNIQUE: Multidetector CT imaging of the chest was performed following the standard protocol without IV contrast. RADIATION DOSE REDUCTION: This exam was performed according to the departmental dose-optimization program which includes automated exposure control, adjustment of the mA and/or kV according to patient size and/or use of iterative reconstruction technique. COMPARISON:  Chest radiograph 05/01/2021 and chest CT 04/12/2021 FINDINGS: Cardiovascular: Coronary, aortic arch, and branch vessel atherosclerotic vascular disease. Coronary stents. AICD noted. 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 Small anterior pericardial effusion. Borderline cardiomegaly. Descending thoracic aortic and upper abdominal aortic stent. Mediastinum/Nodes: AP window lymph node 0.8 cm in short axis on image 62 series 2, stable. Low-density subcarinal lymph nodes including a 1.0 cm node on image 89 series 2, formerly the same. Other scattered small mediastinal lymph nodes are present. Lungs/Pleura: Small bilateral pleural effusions although both are increased from 04/12/2021. Centrilobular emphysema. Mild secondary pulmonary lobular septal accentuation of the apices. Bilateral airway thickening. 1.0 by 0.6 cm stable ground-glass density nodule in the apicoposterior segment left upper lobe on image 22 series 3. Ground-glass density  peribronchovascular nodule in the left upper lobe on image 33 series 3 is stable at 1.2 by 0.7 cm. Ground-glass density 1.7 by 1.0 cm right lower lobe nodule on image 72 series 3. Increased density of a 0.8 by 0.5 cm right upper lobe sub solid pulmonary nodule on image 62 series 3 compared to the 04/12/2021 exam.  Mildly increased atelectasis in the left lower lobe compared to previous. Upper Abdomen: Small depending gallstones in the gallbladder. Upper abdominal aortic stent with stents in the proximal celiac trunk, SMA, and renal arteries. We partially image a ventral hernia containing adipose tissue on image 185 series 2. Musculoskeletal: Bilateral gynecomastia. 2.0 by 0.6 cm sclerotic lesion in the right posterolateral sixth rib, no change from 12/14/2011, considered benign. IMPRESSION: 1. Small but mildly worsened bilateral pleural effusions with associated passive atelectasis. 2. Airway thickening is present, suggesting bronchitis or reactive airways disease. 3. Several ground-glass density pulmonary nodules are present including two left apical nodule stable from 04/12/2021 (questionable precursors of these lesions on 12/14/2011) and a right upper lobe nodule which has increased in density compared to that time, as well as a stable right lower lobe ground-glass density pulmonary nodule. Low-grade adenocarcinoma is a possibility and surveillance is recommended. 4. Aortic Atherosclerosis (ICD10-I70.0) and Emphysema (ICD10-J43.9). Coronary atherosclerosis. 5. 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 Recommend semi-annual imaging followup by CTA or MRA and referral to cardiothoracic surgery if not already obtained. This recommendation follows 2010 ACCF/AHA/AATS/ACR/ASA/SCA/SCAI/SIR/STS/SVM Guidelines for the Diagnosis and Management of Patients With Thoracic Aortic Disease. Circulation. 2010; 121: T245-Y09. Aortic aneurysm NOS (ICD10-I71.9) 6. Coronary and abdominal aortic stents along with stents in the celiac trunk,  SMA, and proximal renal arteries. AICD noted. 7. Small anterior pericardial effusion. 8. Cholelithiasis. 9. Partially imaged ventral hernia containing adipose tissue. Electronically Signed   By: Van Clines M.D.   On: 05/28/2021 08:44   DG Chest Portable 1 View  Result Date: 06/10/2021 CLINICAL DATA:  81 year old male with shortness of breath and nausea. Former smoker. EXAM: PORTABLE CHEST 1 VIEW COMPARISON:  Portable chest 05/28/2021 and earlier. FINDINGS: Portable AP upright views at 0651 hours. Stable left chest cardiac AICD. Stable mild cardiomegaly and mediastinal contours. Visualized tracheal air column is within normal limits. Chronic increased pulmonary interstitial markings with mild basilar predominance on the right appears stable since January. No superimposed pneumothorax, pleural effusion, or consolidation. No acute osseous abnormality identified. Negative visible bowel gas. IMPRESSION: Stable portable appearance of the chest since January, with suspected chronic interstitial changes. No acute cardiopulmonary abnormality. Electronically Signed   By: Genevie Ann M.D.   On: 06/10/2021 07:45   DG Chest Portable 1 View  Result Date: 05/28/2021 CLINICAL DATA:  Shortness of breath, chest pain EXAM: PORTABLE CHEST 1 VIEW COMPARISON:  05/01/2021 FINDINGS: Lungs are clear.  No pleural effusion or pneumothorax. The heart is normal in size.  Left subclavian ICD. Mild degenerative changes of the mid thoracic spine. IMPRESSION: No evidence of acute cardiopulmonary disease. Electronically Signed   By: Julian Hy M.D.   On: 05/28/2021 19:04   DG UGI W SINGLE CM (SOL OR THIN BA)  Result Date: 05/30/2021 CLINICAL DATA:  Upper GI bleed. Evaluate for mucosal ulceration/defect. EXAM: DG UGI W SINGLE CM TECHNIQUE: Scout radiograph was obtained. Single contrast examination was performed using thin liquid barium. This exam was performed by Candiss Norse, PA, and was supervised and interpreted by  myself. FLUOROSCOPY TIME:  Radiation Exposure Index (as provided by the fluoroscopic device): 44.4 mGy COMPARISON:  Chest CT 05/27/2021.  Abdominopelvic CT 04/04/2021. FINDINGS: Scout Radiograph: Due to reported contrast allergy, the study was performed with thin barium after obtaining supine and erect views of the abdomen. The bowel gas pattern is nonobstructive. There is no evidence of pneumoperitoneum. Extensive postsurgical changes are noted, related to previous aortoiliac stent grafting. The patient has an AICD. Study  was performed in the supine and semi erect positions. Patient was unable to stand or lie prone. The patient swallowed the barium without difficulty. The esophageal motility appears within normal limits. There is no evidence of stricture, mass or ulceration. Mild gastric fold thickening is noted which could indicate gastritis. No evidence of mucosal ulceration. The duodenum appears normal. IMPRESSION: 1. No specific cause for upper GI bleeding identified. There is no evidence of mucosal ulceration. 2. Gastric fold thickening which could indicate gastritis. 3. No significant esophageal abnormality. Electronically Signed   By: Richardean Sale M.D.   On: 05/30/2021 13:12   ECHOCARDIOGRAM COMPLETE  Result Date: 05/29/2021    ECHOCARDIOGRAM REPORT   Patient Name:   HALLIS MEDITZ Date of Exam: 05/29/2021 Medical Rec #:  [id number]        Height:       70.0 in Accession #:    [id number]       Weight:       204.0 lb Date of Birth:  10-07-1940       BSA:          2.105 m Patient Age:    50 years         BP:           166/66 mmHg Patient Gender: M                HR:           62 bpm. Exam Location:  [facility] Procedure: 2D Echo, Color Doppler, Cardiac Doppler and Intracardiac            Opacification Agent Indications:     R07.9 Chest Pain  History:         Patient has prior history of Echocardiogram examinations, most                  recent 04/04/2021. CHF, CAD, Pacemaker; Risk Factors:HCL.  Sonographer:      Charmayne Sheer Referring Phys:  [place] Diagnosing Phys: Serafina Royals MD  Sonographer Comments: Technically difficult study due to poor echo windows. Image acquisition challenging due to respiratory motion. IMPRESSIONS  1. Left ventricular ejection fraction, by estimation, is 40 to 45%. The left ventricle has mildly decreased function. The left ventricle demonstrates global hypokinesis. The left ventricular internal cavity size was mildly dilated. Left ventricular diastolic parameters were normal.  2. Right ventricular systolic function is normal. The right ventricular size is normal.  3. Left atrial size was mildly dilated.  4. Right atrial size was mildly dilated.  5. The mitral valve is normal in structure. Mild to moderate mitral valve regurgitation.  6. Tricuspid valve regurgitation is mild to moderate.  7. The aortic valve is normal in structure. Aortic valve regurgitation is not visualized. FINDINGS  Left Ventricle: Left ventricular ejection fraction, by estimation, is 40 to 45%. The left ventricle has mildly decreased function. The left ventricle demonstrates global hypokinesis. Definity contrast agent was given IV to delineate the left ventricular  endocardial borders. The left ventricular internal cavity size was mildly dilated. There is no left ventricular hypertrophy. Left ventricular diastolic parameters were normal. Right Ventricle: The right ventricular size is normal. No increase in right ventricular wall thickness. Right ventricular systolic function is normal. Left Atrium: Left atrial size was mildly dilated. Right Atrium: Right atrial size was mildly dilated. Pericardium: There is no evidence of pericardial effusion. Mitral Valve: The mitral valve is normal in structure. Mild to moderate mitral valve regurgitation.  MV peak gradient, 6.6 mmHg. The mean mitral valve gradient is 4.0 mmHg. Tricuspid Valve: The tricuspid valve is normal in structure. Tricuspid valve regurgitation is mild  to moderate. Aortic Valve: The aortic valve is normal in structure. Aortic valve regurgitation is not visualized. Aortic valve mean gradient measures 11.0 mmHg. Aortic valve peak gradient measures 19.0 mmHg. Aortic valve area, by VTI measures 2.27 cm. Pulmonic Valve: The pulmonic valve was normal in structure. Pulmonic valve regurgitation is trivial. Aorta: The aortic root and ascending aorta are structurally normal, with no evidence of dilitation. IAS/Shunts: No atrial level shunt detected by color flow Doppler.  LEFT VENTRICLE PLAX 2D LVIDd:         5.23 cm   Diastology LVIDs:         4.07 cm   LV e' medial:    5.33 cm/s LV PW:         1.39 cm   LV E/e' medial:  18.0 LV IVS:        1.11 cm   LV e' lateral:   5.66 cm/s LVOT diam:     2.50 cm   LV E/e' lateral: 16.9 LV SV:         116 LV SV Index:   55 LVOT Area:     4.91 cm  RIGHT VENTRICLE RV Basal diam:  4.45 cm LEFT ATRIUM           Index        RIGHT ATRIUM           Index LA diam:      3.70 cm 1.76 cm/m   RA Area:     17.10 cm LA Vol (A4C): 73.3 ml 34.82 ml/m  RA Volume:   40.00 ml  19.00 ml/m  AORTIC VALVE                     PULMONIC VALVE AV Area (Vmax):    2.61 cm      PV Vmax:       0.87 m/s AV Area (Vmean):   2.33 cm      PV Vmean:      57.000 cm/s AV Area (VTI):     2.27 cm      PV VTI:        0.157 m AV Vmax:           218.00 cm/s   PV Peak grad:  3.0 mmHg AV Vmean:          160.000 cm/s  PV Mean grad:  2.0 mmHg AV VTI:            0.513 m AV Peak Grad:      19.0 mmHg AV Mean Grad:      11.0 mmHg LVOT Vmax:         116.00 cm/s LVOT Vmean:        75.800 cm/s LVOT VTI:          0.237 m LVOT/AV VTI ratio: 0.46  AORTA Ao Root diam: 3.40 cm MITRAL VALVE                TRICUSPID VALVE MV Area (PHT): 3.44 cm     TR Peak grad:   43.6 mmHg MV Area VTI:   3.32 cm     TR Vmax:        330.00 cm/s MV Peak grad:  6.6 mmHg MV Mean grad:  4.0 mmHg     SHUNTS MV Vmax:  1.28 m/s     Systemic VTI:  0.24 m MV Vmean:      93.5 cm/s    Systemic Diam: 2.50 cm  MV Decel Time: 221 msec MV E velocity: 95.75 cm/s MV A velocity: 106.00 cm/s MV E/A ratio:  0.90 Serafina Royals MD Electronically signed by Serafina Royals MD Signature Date/Time: 05/29/2021/12:28:40 PM    Final     ECHO 05/29/2021 LVEF 40-45% with global hypokinesis mild to moderate MR, mild to moderate TR  TELEMETRY reviewed by me: Sinus rhythm frequent PVCs, rate between 70 and 80  EKG reviewed by me: NSR rate 84, ventricular bigeminy, LBBB, not significantly changed from 05/28/2021  ASSESSMENT AND PLAN:  The patient is an 81 year old male with a past medical history significant for HFrEF ( 05/29/21 LVEF 40-45%), ischemic cardiomyopathy s/p CRT-D 09/2017, CAD s/p PCI x3 07/2016, history of thoracic aortic aneurysm s/p fenestrated repair 2015, hypertension, hypercholesterolemia, CKD 4, COPD with 3 hospital admissions for symptomatic anemia and acute on chronic CHF since 03/2021 who presented to [facility] ED 05/29/2021 with shortness of breath.  Cardiology is consulted for further assistance and management.   #Dyspnea #elevated troponin likely 2/2 demand ischemia #hypertensive urgency, resolved  #HFrEF (LVEF 40-45%, global hypo), ICM s/p CRT-D 05/2017 The patient presents with acute onset of shortness of breath preceded by upper abdominal pain, nausea without vomiting and a blood pressure reportedly of 222/100 by EMS.  He has not been taking Lasix, Entresto, Plavix since they were held at discharge on 2/22 from his last admission. His BNP on admission is 1700, decreased from last admission at 2800.  Troponins elevation is likely d/t demand ischemia from elevated BP vs anemia in the setting of known CAD. No significant EKG changes and patient continues to denies chest pain.  -BP somewhat improved but still above goal. PRN hydralazine for SBP >160 -troponin peaked at 3300 yesterday -continuous telemetry monitoring and repeat EKG with chest discomfort -Continue Coreg 6.25 twice daily, hydralazine 100 mg 3 times  daily, amlodipine $RemoveBeforeDEI'10mg'vJQXYFigKWaegOsb$  once daily  - s/p lasix $RemoveB'40mg'OhWYiRGB$  x 3 doses. Stopping further doses d/t slight worsening of renal function. ?reinitiate low dose at discharge if renal function improves.  -declines clonidine due to a "sensation he has when he gets MRI contrast" -Recommend conservative management form a cardiac standpoint for now CKD 4. He may need ischemic workup at some point, possibly during this hospitalization but will continue to defer now as he has no chest pain, per Dr. Saralyn Pilar  #CAD s/p PCI x3 07/2016 -S/p $RemoveBef'325mg'GUkQaArvgq$  Aspirin, Continue aspirin 81 mg daily. -defer heparin drip -continue to hold plavix. -increase isosorbide from $RemoveBeforeD'30mg'GmLIElyQzhIVXC$  to $R'60mg'hq$  daily -Continue Lipitor $RemoveBeforeDEI'40mg'nsNvrinMrOknGFEE$  daily  #CKD 4 Renal function slightly worsening overnight with 3.76-3.87-4.5 and EGFR 12 today. Holding diuretics. 06/04/2021 was 3.58, gfr 16.  #COPD On his home 2L he states he uses PRN for dyspena Patient is requesting to see his pulmonologist while in the hospital  This patient's plan of care was discussed and created with Dr. [name] and he is in agreement.  Signed: Tristan Schroeder , PA-C 06/12/2021, 1:02 PM [facility]

## 2021-06-13 DIAGNOSIS — I2510 Atherosclerotic heart disease of native coronary artery without angina pectoris: Secondary | ICD-10-CM | POA: Diagnosis not present

## 2021-06-13 DIAGNOSIS — E1122 Type 2 diabetes mellitus with diabetic chronic kidney disease: Secondary | ICD-10-CM | POA: Diagnosis not present

## 2021-06-13 DIAGNOSIS — J9611 Chronic respiratory failure with hypoxia: Secondary | ICD-10-CM | POA: Diagnosis not present

## 2021-06-13 DIAGNOSIS — N189 Chronic kidney disease, unspecified: Secondary | ICD-10-CM | POA: Diagnosis not present

## 2021-06-13 DIAGNOSIS — N179 Acute kidney failure, unspecified: Secondary | ICD-10-CM

## 2021-06-13 DIAGNOSIS — I5023 Acute on chronic systolic (congestive) heart failure: Secondary | ICD-10-CM | POA: Diagnosis not present

## 2021-06-13 DIAGNOSIS — I130 Hypertensive heart and chronic kidney disease with heart failure and stage 1 through stage 4 chronic kidney disease, or unspecified chronic kidney disease: Secondary | ICD-10-CM | POA: Diagnosis not present

## 2021-06-13 DIAGNOSIS — N184 Chronic kidney disease, stage 4 (severe): Secondary | ICD-10-CM | POA: Diagnosis not present

## 2021-06-13 DIAGNOSIS — D631 Anemia in chronic kidney disease: Secondary | ICD-10-CM | POA: Diagnosis not present

## 2021-06-13 DIAGNOSIS — I214 Non-ST elevation (NSTEMI) myocardial infarction: Secondary | ICD-10-CM | POA: Diagnosis not present

## 2021-06-13 DIAGNOSIS — I5040 Unspecified combined systolic (congestive) and diastolic (congestive) heart failure: Secondary | ICD-10-CM | POA: Diagnosis not present

## 2021-06-13 LAB — CBC WITH DIFFERENTIAL/PLATELET
Abs Immature Granulocytes: 0.06 10*3/uL (ref 0.00–0.07)
Basophils Absolute: 0 10*3/uL (ref 0.0–0.1)
Basophils Relative: 0 %
Eosinophils Absolute: 0 10*3/uL (ref 0.0–0.5)
Eosinophils Relative: 0 %
HCT: 25.8 % — ABNORMAL LOW (ref 39.0–52.0)
Hemoglobin: 8.4 g/dL — ABNORMAL LOW (ref 13.0–17.0)
Immature Granulocytes: 1 %
Lymphocytes Relative: 6 %
Lymphs Abs: 0.6 10*3/uL — ABNORMAL LOW (ref 0.7–4.0)
MCH: 29.3 pg (ref 26.0–34.0)
MCHC: 32.6 g/dL (ref 30.0–36.0)
MCV: 89.9 fL (ref 80.0–100.0)
Monocytes Absolute: 1.3 10*3/uL — ABNORMAL HIGH (ref 0.1–1.0)
Monocytes Relative: 11 %
Neutro Abs: 9.1 10*3/uL — ABNORMAL HIGH (ref 1.7–7.7)
Neutrophils Relative %: 82 %
Platelets: 107 10*3/uL — ABNORMAL LOW (ref 150–400)
RBC: 2.87 MIL/uL — ABNORMAL LOW (ref 4.22–5.81)
RDW: 13.5 % (ref 11.5–15.5)
WBC: 11.1 10*3/uL — ABNORMAL HIGH (ref 4.0–10.5)
nRBC: 0 % (ref 0.0–0.2)

## 2021-06-13 LAB — BASIC METABOLIC PANEL
Anion gap: 9 (ref 5–15)
BUN: 78 mg/dL — ABNORMAL HIGH (ref 8–23)
CO2: 22 mmol/L (ref 22–32)
Calcium: 7.9 mg/dL — ABNORMAL LOW (ref 8.9–10.3)
Chloride: 105 mmol/L (ref 98–111)
Creatinine, Ser: 4.89 mg/dL — ABNORMAL HIGH (ref 0.61–1.24)
GFR, Estimated: 11 mL/min — ABNORMAL LOW (ref >60)
Glucose, Bld: 105 mg/dL — ABNORMAL HIGH (ref 70–99)
Potassium: 4 mmol/L (ref 3.5–5.1)
Sodium: 136 mmol/L (ref 135–145)

## 2021-06-13 MED ORDER — SODIUM CHLORIDE 0.9% FLUSH
3.0000 mL | Freq: Two times a day (BID) | INTRAVENOUS | Status: DC
  Administered 2021-06-13 – 2021-06-20 (×12): 3 mL via INTRAVENOUS

## 2021-06-13 MED ORDER — LACTATED RINGERS IV SOLN: INTRAVENOUS | Status: AC

## 2021-06-13 MED ORDER — TUBERCULIN PPD 5 UNIT/0.1ML ID SOLN
5.0000 [IU] | Freq: Once | INTRADERMAL | Status: AC
  Administered 2021-06-13: 5 [IU] via INTRADERMAL
  Filled 2021-06-13: qty 0.1

## 2021-06-13 NOTE — Care Management Important Message (Signed)
Important Message ? ?Patient Details  ?Name: Peter [name] ?MRN: [id number] ?Date of Birth: 10-Sep-1940 ? ? ?Medicare Important Message Given:  Yes ? ?Patient asleep upon time of visit.  Copy of Medicare IM left on counter in room for reference.  ? ? ?[name] ?06/13/2021, 12:39 PM ?

## 2021-06-13 NOTE — Progress Notes (Signed)
Physical Therapy Treatment ?Patient Details ?Name: Peter [name] ?MRN: [id number] ?DOB: 1940-06-20 ?Today's Date: 06/13/2021 ? ? ?History of Present Illness Per MD note, Peter [name] is a 81 y.o. 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 Patient was recently hospitalized from 2/15 - 2/22 due to chest pain and non-STEMI. ? ?  ?PT Comments  ? ? Pt is making gradual progress towards goals with ability to perform ambulation in hallway using RW. All mobility performed on 2L with sats WNL. Pt very sleepy and requesting to return back to bed after ambulation. Will continue to mobilize throughout hospital stay.  ?Recommendations for follow up therapy are one component of a multi-disciplinary discharge planning process, led by the attending physician.  Recommendations may be updated based on patient status, additional functional criteria and insurance authorization. ? ?Follow Up Recommendations ? Home health PT ?  ?  ?Assistance Recommended at Discharge PRN  ?Patient can return home with the following A little help with walking and/or transfers ?  ?Equipment Recommendations ? None recommended by PT  ?  ?Recommendations for Other Services   ? ? ?  ?Precautions / Restrictions Precautions ?Precautions: Fall ?Restrictions ?Weight Bearing Restrictions: No  ?  ? ?Mobility ? Bed Mobility ?Overal bed mobility: Modified Independent ?  ?  ?  ?  ?  ?  ?General bed mobility comments: safe technique with ease of mobility ?  ? ?Transfers ?Overall transfer level: Modified independent ?Equipment used: Rolling walker (2 wheels) ?Transfers: Sit to/from Stand ?Sit to Stand: Modified independent (Device/Increase time) ?  ?  ?  ?  ?  ?General transfer comment: safe technique,  no cues for hand placement and ease of mobility ?  ? ?Ambulation/Gait ?Ambulation/Gait assistance: Supervision ?Gait Distance (Feet): 200 Feet ?Assistive device: Rolling walker (2 wheels) ?Gait Pattern/deviations: Step-through pattern ?  ?  ?  ?General Gait Details: ambulated on 2L of O2 with sats WNL. Slightly SOB symptoms with exertion. 96% with exertion ? ? ?Stairs ?  ?  ?  ?  ?  ? ? ?Wheelchair Mobility ?  ? ?Modified Rankin (Stroke Patients Only) ?  ? ? ?  ?Balance Overall balance assessment: Needs assistance ?Sitting-balance support: No upper extremity supported, Feet supported ?Sitting balance-Leahy Scale: Good ?  ?  ?Standing balance support: Bilateral upper extremity supported ?Standing balance-Leahy Scale: Good ?  ?  ?  ?  ?  ?  ?  ?  ?  ?  ?  ?  ?  ? ?  ?Cognition Arousal/Alertness: Awake/alert ?Behavior During Therapy: [facility] for tasks assessed/performed ?Overall Cognitive Status: Within Functional Limits for tasks assessed ?  ?  ?  ?  ?  ?  ?  ?  ?  ?  ?  ?  ?  ?  ?  ?  ?General Comments: sleeping upon arrival, however awakens easily and agreeable to therapy session ?  ?  ? ?  ?Exercises   ? ?  ?General Comments   ?  ?  ? ?Pertinent Vitals/Pain Pain Assessment ?Pain Assessment: No/denies pain  ? ? ?Home Living   ?  ?  ?  ?  ?  ?  ?  ?  ?  ?   ?  ?Prior Function    ?  ?  ?   ? ?  PT Goals (current goals can now be found in the care plan section) Acute Rehab PT Goals ?Patient Stated Goal: to improve breathing to prevent hospitalizations ?PT Goal Formulation: With patient ?Time For Goal Achievement: 06/25/21 ?Potential to Achieve Goals: Good ?Progress towards PT goals: Progressing toward goals ? ?  ?Frequency ? ? ? Min 2X/week ? ? ? ?  ?PT Plan Current plan remains appropriate  ? ? ?Co-evaluation   ?  ?  ?  ?  ? ?  ?AM-PAC PT "6 Clicks" Mobility   ?Outcome Measure ? Help needed turning from your back to your side while in a flat bed without using bedrails?: None ?Help needed moving from lying on your back to  sitting on the side of a flat bed without using bedrails?: None ?Help needed moving to and from a bed to a chair (including a wheelchair)?: None ?Help needed standing up from a chair using your arms (e.g., wheelchair or bedside chair)?: A Little ?Help needed to walk in hospital room?: A Little ?Help needed climbing 3-5 steps with a railing? : A Little ?6 Click Score: 21 ? ?  ?End of Session Equipment Utilized During Treatment: Gait belt;Oxygen ?Activity Tolerance: Patient tolerated treatment well ?Patient left: in bed ?Nurse Communication: Mobility status ?PT Visit Diagnosis: Muscle weakness (generalized) (M62.81);Difficulty in walking, not elsewhere classified (R26.2) ?  ? ? ?Time: 6168-3729 ?PT Time Calculation (min) (ACUTE ONLY): 17 min ? ?Charges:  $Gait Training: 8-22 mins          ?          ? ?Greggory Stallion, PT, DPT, GCS ?[phone] ? ? ? ?Veronica Fretz ?06/13/2021, 12:09 PM ? ?

## 2021-06-13 NOTE — Progress Notes (Addendum)
[place] NOTE       Patient ID: Peter [name] MRN: [id number] DOB/AGE: 82-Oct-1942 82 y.o.  Admit date: 06/10/2021 Referring Physician Dr. Lavonia Drafts Primary Physician Mortimer Fries, PA-C Primary Cardiologist Dr. Clayborn Bigness Reason for Consultation SOB, chest discomfort  HPI: The patient is an 81 year old male with a past medical history significant for HFrEF ( 05/29/21 LVEF 40-45%), ischemic cardiomyopathy s/p CRT-D 09/2017, CAD s/p PCI x3 07/2016, history of thoracic aortic aneurysm s/p fenestrated repair 2015, hypertension, hypercholesterolemia, CKD 4, COPD with 3 hospital admissions for symptomatic anemia and acute on chronic CHF since 03/2021 who presented to [facility] ED 05/29/2021 with shortness of breath.  Cardiology is consulted for further assistance and management.   Interval history: -feels better this morning. Laying flat and nasal cannula is around his neck during interview, no SOB or chest pain.  -renal function continues to worsen. Requests to see nephrology and pulmonology.   Review of systems complete and found to be negative unless listed above     Past Medical History:  Diagnosis Date   Anginal pain ([place])    Asthma    Bladder infection, acute 03/2011   "had a whole lot of bleeding from this"   CHF (congestive heart failure) ([place])    Coronary artery disease    High cholesterol    Hypertension    Macular degeneration 12/14/2011   "had it in my right; getting shots now in my left"   Myocardial infarction [facility]) 1996   NSTEMI (non-ST elevated myocardial infarction) ([place]) 12/14/2011   Shortness of breath 12/14/2011   "@ rest, lying down, w/exertion"    Past Surgical History:  Procedure Laterality Date   [place]; ~ 2003; ~ 2008   "total of 3"   CORONARY STENT INTERVENTION N/A 08/10/2016   Procedure: Coronary Stent Intervention;  Surgeon: [name], MD;  Location: [place] CV LAB;  Service:  Cardiovascular;  Laterality: N/A;   HERNIA REPAIR  ~ 2001   "abdominal w/mesh implanted"   LEFT HEART CATH AND CORONARY ANGIOGRAPHY N/A 08/10/2016   Procedure: Left Heart Cath and Coronary Angiography;  Surgeon: [name], MD;  Location: [place] CV LAB;  Service: Cardiovascular;  Laterality: N/A;   LEFT HEART CATHETERIZATION WITH CORONARY ANGIOGRAM N/A 12/16/2011   Procedure: LEFT HEART CATHETERIZATION WITH CORONARY ANGIOGRAM;  Surgeon: Minus Breeding, MD;  Location: [facility] CATH LAB;  Service: Cardiovascular;  Laterality: N/A;   PERCUTANEOUS CORONARY STENT INTERVENTION (PCI-S) N/A 12/17/2011   Procedure: PERCUTANEOUS CORONARY STENT INTERVENTION (PCI-S);  Surgeon: Sherren Mocha, MD;  Location: [facility] CATH LAB;  Service: Cardiovascular;  Laterality: N/A;   TONSILLECTOMY     "I was a kid"    Medications Prior to Admission  Medication Sig Dispense Refill Last Dose   amLODipine (NORVASC) 10 MG tablet Take 1 tablet (10 mg total) by mouth daily. 30 tablet 2 06/09/2021 at 0800   atorvastatin (LIPITOR) 40 MG tablet Take 1 tablet (40 mg total) by mouth daily at 6 PM. 30 tablet 0 06/10/2021 at 0300   calcitRIOL (ROCALTROL) 0.25 MCG capsule Take 0.25 mcg by mouth daily.   06/09/2021 at unknown   carvedilol (COREG) 6.25 MG tablet Take 1 tablet (6.25 mg total) by mouth 2 (two) times daily with a meal. 60 tablet 2 06/10/2021 at 0300   cetirizine (ZYRTEC) 10 MG tablet Take 10 mg by mouth.      citalopram (CELEXA) 20 MG tablet Take 20 mg by mouth daily.  06/09/2021 at 0800   clobetasol cream (TEMOVATE) 9.15 % Apply 1 application topically 2 (two) times daily as needed (psoriasis).  1  at unknown prn   cyanocobalamin 1000 MCG tablet Take 1,000 mcg by mouth daily.       finasteride (PROSCAR) 5 MG tablet Take 1 tablet (5 mg total) by mouth daily. 30 tablet 2 06/09/2021 at 0800   GLUCOSAMINE-CHONDROITIN-VIT C PO Take 1 tablet by mouth daily.      hydrALAZINE (APRESOLINE) 100 MG tablet Take 1 tablet (100 mg total)  by mouth 3 (three) times daily. 90 tablet 2 06/10/2021 at 0300   isosorbide mononitrate (IMDUR) 30 MG 24 hr tablet Take 1 tablet (30 mg total) by mouth daily. 30 tablet 2 06/09/2021 at 0800   omeprazole (PRILOSEC) 20 MG capsule Take 20 mg by mouth in the morning.   06/09/2021 at 0800   tamsulosin (FLOMAX) 0.4 MG CAPS capsule Take 1 capsule (0.4 mg total) by mouth daily. 30 capsule 2 06/09/2021 at 0800   umeclidinium bromide (INCRUSE ELLIPTA) 62.5 MCG/ACT AEPB Inhale 1 puff into the lungs daily. 30 each 2 06/10/2021 at 0600   cloNIDine (CATAPRES) 0.1 MG tablet Take 1 tablet (0.1 mg total) by mouth 2 (two) times daily. (Patient not taking: Reported on 06/05/2021) 60 tablet 2 Not Taking   clopidogrel (PLAVIX) 75 MG tablet Hold until outpatient followup with cardiology due to worsening anemia. (Patient not taking: Reported on 06/05/2021) 30 tablet 1    furosemide (LASIX) 40 MG tablet Hold until outpatient followup with cardiology. (Patient not taking: Reported on 06/05/2021) 30 tablet     spironolactone (ALDACTONE) 25 MG tablet Hold until followup with your outpatient doctor due to worsening kidney function. (Patient not taking: Reported on 06/05/2021)       Social History   Socioeconomic History   Marital status: Widowed    Spouse name: Not on file   Number of children: Not on file   Years of education: Not on file   Highest education level: Not on file  Occupational History   Not on file  Tobacco Use   Smoking status: Former    Packs/day: 0.50    Years: 0.50    Pack years: 0.25    Types: Cigarettes   Smokeless tobacco: Never   Tobacco comments:    11/23/16 Still not ready to quit smoking.  Substance and Sexual Activity   Alcohol use: Yes    Alcohol/week: 1.0 standard drink    Types: 1 Cans of beer per week    Comment: 12/14/2011 "if my kidneys are sore, I drink 1 beer/day; if not sore; no beer; occasionally drink socially; ave 1 beer/wk maybe"   Drug use: No   Sexual activity: Not Currently   Other Topics Concern   Not on file  Social History Narrative   Not on file   Social Determinants of Health   Financial Resource Strain: Not on file  Food Insecurity: Not on file  Transportation Needs: Not on file  Physical Activity: Not on file  Stress: Not on file  Social Connections: Not on file  Intimate Partner Violence: Not on file    Family History  Family history unknown: Yes      Review of systems complete and found to be negative unless listed above    PHYSICAL EXAM General: Elderly appearing Caucasian male, well nourished, in no acute distress. Laying flat in PCU bed.  HEENT:  Normocephalic and atraumatic. Neck:  No JVD.  Lungs: Normal respiratory  effort on room air. [place] around his neck. Clear bilaterally to auscultation. No wheezes, crackles, rhonchi.  Heart: Regular rate with frequent extra beats. Normal S1 and S2 without gallops or murmurs. Radial & DP pulses 2+ bilaterally. Abdomen: Non-distended appearing.  Msk: Normal strength and tone for age. Extremities: Warm and well perfused. No clubbing, cyanosis.  Trace bilateral LEE edema  Neuro: Alert and oriented X 3. Psych:  Answers questions appropriately.   Labs:   Lab Results  Component Value Date   WBC 11.1 (H) 06/13/2021   HGB 8.4 (L) 06/13/2021   HCT 25.8 (L) 06/13/2021   MCV 89.9 06/13/2021   PLT 107 (L) 06/13/2021    Recent Labs  Lab 06/13/21 0532  NA 136  K 4.0  CL 105  CO2 22  BUN 78*  CREATININE 4.89*  CALCIUM 7.9*  GLUCOSE 105*    Lab Results  Component Value Date   CKTOTAL 23 (L) 12/11/2013   CKMB 1.0 12/11/2013   TROPONINI 0.05 (HH) 12/21/2017     Lab Results  Component Value Date   CHOL 110 05/29/2021   CHOL 108 08/08/2016   CHOL 141 12/16/2011   Lab Results  Component Value Date   HDL 29 (L) 05/29/2021   HDL 36 (L) 08/08/2016   HDL 38 (L) 12/16/2011   Lab Results  Component Value Date   LDLCALC 59 05/29/2021   LDLCALC 50 08/08/2016   LDLCALC 83 12/16/2011    Lab Results  Component Value Date   TRIG 110 05/29/2021   TRIG 111 08/08/2016   TRIG 102 12/16/2011   Lab Results  Component Value Date   CHOLHDL 3.8 05/29/2021   CHOLHDL 3.0 08/08/2016   CHOLHDL 3.7 12/16/2011   No results found for: LDLDIRECT    Radiology: CT CHEST WO CONTRAST  Result Date: 05/28/2021 CLINICAL DATA:  Shortness of breath. History of congestive heart failure. Substantial smoking history. EXAM: CT CHEST WITHOUT CONTRAST TECHNIQUE: Multidetector CT imaging of the chest was performed following the standard protocol without IV contrast. RADIATION DOSE REDUCTION: This exam was performed according to the departmental dose-optimization program which includes automated exposure control, adjustment of the mA and/or kV according to patient size and/or use of iterative reconstruction technique. COMPARISON:  Chest radiograph 05/01/2021 and chest CT 04/12/2021 FINDINGS: Cardiovascular: Coronary, aortic arch, and branch vessel atherosclerotic vascular disease. Coronary stents. AICD noted. 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 Small anterior pericardial effusion. Borderline cardiomegaly. Descending thoracic aortic and upper abdominal aortic stent. Mediastinum/Nodes: AP window lymph node 0.8 cm in short axis on image 62 series 2, stable. Low-density subcarinal lymph nodes including a 1.0 cm node on image 89 series 2, formerly the same. Other scattered small mediastinal lymph nodes are present. Lungs/Pleura: Small bilateral pleural effusions although both are increased from 04/12/2021. Centrilobular emphysema. Mild secondary pulmonary lobular septal accentuation of the apices. Bilateral airway thickening. 1.0 by 0.6 cm stable ground-glass density nodule in the apicoposterior segment left upper lobe on image 22 series 3. Ground-glass density peribronchovascular nodule in the left upper lobe on image 33 series 3 is stable at 1.2  by 0.7 cm. Ground-glass density 1.7 by 1.0 cm right lower lobe nodule on image 72 series 3. Increased density of a 0.8 by 0.5 cm right upper lobe sub solid pulmonary nodule on image 62 series 3 compared to the 04/12/2021 exam. Mildly increased atelectasis in the left lower lobe compared to previous. Upper  Abdomen: Small depending gallstones in the gallbladder. Upper abdominal aortic stent with stents in the proximal celiac trunk, SMA, and renal arteries. We partially image a ventral hernia containing adipose tissue on image 185 series 2. Musculoskeletal: Bilateral gynecomastia. 2.0 by 0.6 cm sclerotic lesion in the right posterolateral sixth rib, no change from 12/14/2011, considered benign. IMPRESSION: 1. Small but mildly worsened bilateral pleural effusions with associated passive atelectasis. 2. Airway thickening is present, suggesting bronchitis or reactive airways disease. 3. Several ground-glass density pulmonary nodules are present including two left apical nodule stable from 04/12/2021 (questionable precursors of these lesions on 12/14/2011) and a right upper lobe nodule which has increased in density compared to that time, as well as a stable right lower lobe ground-glass density pulmonary nodule. Low-grade adenocarcinoma is a possibility and surveillance is recommended. 4. Aortic Atherosclerosis (ICD10-I70.0) and Emphysema (ICD10-J43.9). Coronary atherosclerosis. 5. 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 Recommend semi-annual imaging followup by CTA or MRA and referral to cardiothoracic surgery if not already obtained. This recommendation follows 2010 ACCF/AHA/AATS/ACR/ASA/SCA/SCAI/SIR/STS/SVM Guidelines for the Diagnosis and Management of Patients With Thoracic Aortic Disease. Circulation. 2010; 121: J628-B15. Aortic aneurysm NOS (ICD10-I71.9) 6. Coronary and abdominal aortic stents along with stents in the celiac trunk, SMA, and proximal renal arteries. AICD noted. 7. Small anterior pericardial effusion. 8.  Cholelithiasis. 9. Partially imaged ventral hernia containing adipose tissue. Electronically Signed   By: Van Clines M.D.   On: 05/28/2021 08:44   DG Chest Portable 1 View  Result Date: 06/10/2021 CLINICAL DATA:  81 year old male with shortness of breath and nausea. Former smoker. EXAM: PORTABLE CHEST 1 VIEW COMPARISON:  Portable chest 05/28/2021 and earlier. FINDINGS: Portable AP upright views at 0651 hours. Stable left chest cardiac AICD. Stable mild cardiomegaly and mediastinal contours. Visualized tracheal air column is within normal limits. Chronic increased pulmonary interstitial markings with mild basilar predominance on the right appears stable since January. No superimposed pneumothorax, pleural effusion, or consolidation. No acute osseous abnormality identified. Negative visible bowel gas. IMPRESSION: Stable portable appearance of the chest since January, with suspected chronic interstitial changes. No acute cardiopulmonary abnormality. Electronically Signed   By: Genevie Ann M.D.   On: 06/10/2021 07:45   DG Chest Portable 1 View  Result Date: 05/28/2021 CLINICAL DATA:  Shortness of breath, chest pain EXAM: PORTABLE CHEST 1 VIEW COMPARISON:  05/01/2021 FINDINGS: Lungs are clear.  No pleural effusion or pneumothorax. The heart is normal in size.  Left subclavian ICD. Mild degenerative changes of the mid thoracic spine. IMPRESSION: No evidence of acute cardiopulmonary disease. Electronically Signed   By: Julian Hy M.D.   On: 05/28/2021 19:04   DG UGI W SINGLE CM (SOL OR THIN BA)  Result Date: 05/30/2021 CLINICAL DATA:  Upper GI bleed. Evaluate for mucosal ulceration/defect. EXAM: DG UGI W SINGLE CM TECHNIQUE: Scout radiograph was obtained. Single contrast examination was performed using thin liquid barium. This exam was performed by Candiss Norse, PA, and was supervised and interpreted by myself. FLUOROSCOPY TIME:  Radiation Exposure Index (as provided by the fluoroscopic device):  44.4 mGy COMPARISON:  Chest CT 05/27/2021.  Abdominopelvic CT 04/04/2021. FINDINGS: Scout Radiograph: Due to reported contrast allergy, the study was performed with thin barium after obtaining supine and erect views of the abdomen. The bowel gas pattern is nonobstructive. There is no evidence of pneumoperitoneum. Extensive postsurgical changes are noted, related to previous aortoiliac stent grafting. The patient has an AICD. Study was performed in the supine and semi erect positions. Patient was unable  to stand or lie prone. The patient swallowed the barium without difficulty. The esophageal motility appears within normal limits. There is no evidence of stricture, mass or ulceration. Mild gastric fold thickening is noted which could indicate gastritis. No evidence of mucosal ulceration. The duodenum appears normal. IMPRESSION: 1. No specific cause for upper GI bleeding identified. There is no evidence of mucosal ulceration. 2. Gastric fold thickening which could indicate gastritis. 3. No significant esophageal abnormality. Electronically Signed   By: Richardean Sale M.D.   On: 05/30/2021 13:12   ECHOCARDIOGRAM COMPLETE  Result Date: 05/29/2021    ECHOCARDIOGRAM REPORT   Patient Name:   Peter [name] Date of Exam: 05/29/2021 Medical Rec #:  [id number]        Height:       70.0 in Accession #:    [id number]       Weight:       204.0 lb Date of Birth:  [birth year]       BSA:          2.105 m Patient Age:    49 years         BP:           166/66 mmHg Patient Gender: M                HR:           62 bpm. Exam Location:  [facility] Procedure: 2D Echo, Color Doppler, Cardiac Doppler and Intracardiac            Opacification Agent Indications:     R07.9 Chest Pain  History:         Patient has prior history of Echocardiogram examinations, most                  recent 04/04/2021. CHF, CAD, Pacemaker; Risk Factors:HCL.  Sonographer:     Charmayne Sheer Referring Phys:  [place] Diagnosing Phys: Serafina Royals MD   Sonographer Comments: Technically difficult study due to poor echo windows. Image acquisition challenging due to respiratory motion. IMPRESSIONS  1. Left ventricular ejection fraction, by estimation, is 40 to 45%. The left ventricle has mildly decreased function. The left ventricle demonstrates global hypokinesis. The left ventricular internal cavity size was mildly dilated. Left ventricular diastolic parameters were normal.  2. Right ventricular systolic function is normal. The right ventricular size is normal.  3. Left atrial size was mildly dilated.  4. Right atrial size was mildly dilated.  5. The mitral valve is normal in structure. Mild to moderate mitral valve regurgitation.  6. Tricuspid valve regurgitation is mild to moderate.  7. The aortic valve is normal in structure. Aortic valve regurgitation is not visualized. FINDINGS  Left Ventricle: Left ventricular ejection fraction, by estimation, is 40 to 45%. The left ventricle has mildly decreased function. The left ventricle demonstrates global hypokinesis. Definity contrast agent was given IV to delineate the left ventricular  endocardial borders. The left ventricular internal cavity size was mildly dilated. There is no left ventricular hypertrophy. Left ventricular diastolic parameters were normal. Right Ventricle: The right ventricular size is normal. No increase in right ventricular wall thickness. Right ventricular systolic function is normal. Left Atrium: Left atrial size was mildly dilated. Right Atrium: Right atrial size was mildly dilated. Pericardium: There is no evidence of pericardial effusion. Mitral Valve: The mitral valve is normal in structure. Mild to moderate mitral valve regurgitation. MV peak gradient, 6.6 mmHg. The mean mitral valve gradient is 4.0  mmHg. Tricuspid Valve: The tricuspid valve is normal in structure. Tricuspid valve regurgitation is mild to moderate. Aortic Valve: The aortic valve is normal in structure. Aortic valve  regurgitation is not visualized. Aortic valve mean gradient measures 11.0 mmHg. Aortic valve peak gradient measures 19.0 mmHg. Aortic valve area, by VTI measures 2.27 cm. Pulmonic Valve: The pulmonic valve was normal in structure. Pulmonic valve regurgitation is trivial. Aorta: The aortic root and ascending aorta are structurally normal, with no evidence of dilitation. IAS/Shunts: No atrial level shunt detected by color flow Doppler.  LEFT VENTRICLE PLAX 2D LVIDd:         5.23 cm   Diastology LVIDs:         4.07 cm   LV e' medial:    5.33 cm/s LV PW:         1.39 cm   LV E/e' medial:  18.0 LV IVS:        1.11 cm   LV e' lateral:   5.66 cm/s LVOT diam:     2.50 cm   LV E/e' lateral: 16.9 LV SV:         116 LV SV Index:   55 LVOT Area:     4.91 cm  RIGHT VENTRICLE RV Basal diam:  4.45 cm LEFT ATRIUM           Index        RIGHT ATRIUM           Index LA diam:      3.70 cm 1.76 cm/m   RA Area:     17.10 cm LA Vol (A4C): 73.3 ml 34.82 ml/m  RA Volume:   40.00 ml  19.00 ml/m  AORTIC VALVE                     PULMONIC VALVE AV Area (Vmax):    2.61 cm      PV Vmax:       0.87 m/s AV Area (Vmean):   2.33 cm      PV Vmean:      57.000 cm/s AV Area (VTI):     2.27 cm      PV VTI:        0.157 m AV Vmax:           218.00 cm/s   PV Peak grad:  3.0 mmHg AV Vmean:          160.000 cm/s  PV Mean grad:  2.0 mmHg AV VTI:            0.513 m AV Peak Grad:      19.0 mmHg AV Mean Grad:      11.0 mmHg LVOT Vmax:         116.00 cm/s LVOT Vmean:        75.800 cm/s LVOT VTI:          0.237 m LVOT/AV VTI ratio: 0.46  AORTA Ao Root diam: 3.40 cm MITRAL VALVE                TRICUSPID VALVE MV Area (PHT): 3.44 cm     TR Peak grad:   43.6 mmHg MV Area VTI:   3.32 cm     TR Vmax:        330.00 cm/s MV Peak grad:  6.6 mmHg MV Mean grad:  4.0 mmHg     SHUNTS MV Vmax:       1.28 m/s     Systemic VTI:  0.24 m MV Vmean:      93.5 cm/s    Systemic Diam: 2.50 cm MV Decel Time: 221 msec MV E velocity: 95.75 cm/s MV A velocity: 106.00 cm/s MV  E/A ratio:  0.90 Serafina Royals MD Electronically signed by Serafina Royals MD Signature Date/Time: 05/29/2021/12:28:40 PM    Final     ECHO 05/29/2021 LVEF 40-45% with global hypokinesis mild to moderate MR, mild to moderate TR  TELEMETRY reviewed by me: Sinus rhythm frequent PVCs, rate between 70 and 80  EKG reviewed by me: NSR rate 84, ventricular bigeminy, LBBB, not significantly changed from 05/28/2021  ASSESSMENT AND PLAN:  The patient is an 81 year old male with a past medical history significant for HFrEF ( 05/29/21 LVEF 40-45%), ischemic cardiomyopathy s/p CRT-D 09/2017, CAD s/p PCI x3 07/2016, history of thoracic aortic aneurysm s/p fenestrated repair 2015, hypertension, hypercholesterolemia, CKD 4, COPD with 3 hospital admissions for symptomatic anemia and acute on chronic CHF since 03/2021 who presented to [facility] ED 05/29/2021 with shortness of breath.  Cardiology is consulted for further assistance and management.   #Dyspnea #elevated troponin likely 2/2 demand ischemia #hypertensive urgency, resolved  #HFrEF (LVEF 40-45%, global hypo)   ICM s/p CRT-D 05/2017 The patient presents with acute onset of shortness of breath preceded by upper abdominal pain, nausea without vomiting and a blood pressure reportedly of 222/100 by EMS.  He has not been taking Lasix, Entresto, Plavix since they were held at discharge on 2/22 from his last admission. His BNP on admission is 1700, decreased from last admission at 2800.  Troponins elevation is likely d/t demand ischemia from elevated BP vs anemia in the setting of known CAD. No significant EKG changes and patient continues to denies chest pain. Appears euvolemic 3/3. -BP improved. PRN hydralazine for SBP >160 -troponin peaked at 3300 3/1 -continuous telemetry monitoring and repeat EKG with chest discomfort -Continue Coreg 6.25 twice daily, hydralazine 100 mg 3 times daily, amlodipine 68m once daily  - s/p lasix 478mx 3 doses. Stopping further doses d/t  slight worsening of renal function. ?reinitiate low dose at discharge if renal function improves.  -declines clonidine due to a "sensation he has when he gets MRI contrast" -Recommend conservative management form a cardiac standpoint for now with CKD 4. He may need ischemic workup at some point, but will continue to defer now as he has no chest pain, per Dr. PaSaralyn Pilar#CAD s/p PCI x3 07/2016 -S/p 32586mspirin, Continue aspirin 81 mg daily. -defer heparin drip -continue to hold plavix. -continue isosorbide  86m18mily -Continue Lipitor 40mg62mly  #CKD 4 Renal function slightly worsening overnight with 3.76-3.87-4.5-4.89 and EGFR 11 today. Holding diuretics. 06/04/2021 was 3.58, gfr 16 with holding diuretics -may need nephrology input for assistance  #COPD On his home 2L he states he uses PRN for dyspena Patient is requesting to see his pulmonologist while in the hospital  This patient's plan of care was discussed and created with Dr. AlexaIsaias Cowmanhe is in agreement.  Signed: Dajon Lazar Tristan Schroeder-C 06/13/2021, 8:52 AM [facility]

## 2021-06-13 NOTE — TOC Progression Note (Signed)
Transition of Care (TOC) - Progression Note  ? ? ?Patient Details  ?Name: Peter [name] ?MRN: [id number] ?Date of Birth: [birth year] ? ?Transition of Care (TOC) CM/SW Contact  ?Candie Chroman, LCSW ?Phone Number: ?06/13/2021, 10:00 AM ? ?Clinical Narrative: Received voicemail from daughter asking about ALF vs SNF. Explained that patient walked 200 feet with supervision yesterday so PT only recommending home health. Daughter expressed concern with patient being home right now and she is wanting him to go to ALF, at least temporarily. Daughter understands we cannot send him to ALF without his consent. She is agreeable to home health social worker to assist with placement from home if he agrees. Left message for [facility] representative to see if we can add RN and SW.   ? ?Expected Discharge Plan: [place] ?Barriers to Discharge: Continued Medical Work up ? ?Expected Discharge Plan and Services ?Expected Discharge Plan: [place] ?  ?  ?Post Acute Care Choice: Resumption of Svcs/PTA Provider ?Living arrangements for the past 2 months: [place] ?                ?  ?  ?  ?  ?  ?HH Arranged: PT ?[place] Agency: [facility] ([place]) ?Date HH Agency Contacted: 06/11/21 ?  ?Representative spoke with at [facility]: Floydene Flock ? ? ?Social Determinants of Health (SDOH) Interventions ?  ? ?Readmission Risk Interventions ?Readmission Risk Prevention Plan 06/11/2021 04/28/2021  ?Transportation Screening Complete Complete  ?PCP or Specialist Appt within 3-5 Days - Complete  ?[place] or Home Care Consult - Complete  ?Social Work Consult for [place] Planning/Counseling - Complete  ?Palliative Care Screening - Not Applicable  ?Medication Review Press photographer) Complete Complete  ?PCP or Specialist appointment within 3-5 days of discharge Complete -  ?[place] or Home Care Consult Complete -  ?SW Recovery Care/Counseling Consult Complete -  ?Palliative Care Screening Not  Applicable -  ?[place] Not Applicable -  ?Some recent data might be hidden  ? ? ?

## 2021-06-13 NOTE — Progress Notes (Addendum)
Progress Note    Peter [name]  ZHG:[id number] DOB: May 24, 1940  DOA: 06/10/2021 PCP: Tracie Harrier, MD      Brief Narrative:    Medical records reviewed and are as summarized below:  Peter [name] is a 81 y.o. male with medical history significant of sCHF with EF 40-45%, CAD with multiple stent placement, hypertension, hyperlipidemia, asthma, as needed oxygen at home, GERD, depression, AAA repair, aortic arch aneurysm, former smoker (recently quit smoking), CKD-4, AICD placement. Patient was recently hospitalized from 2/15 - 2/22 due to chest pain and non-STEMI.  He also had black stool bowel movement, GI was consulted, EGD was done, which showed gastritis.  Patient's Plavix is on hold.    He presented to the hospital because of shortness of breath and cough.   He was admitted to the hospital for acute on chronic systolic CHF. Troponins were also significantly elevated but this was attributed to demand ischemia.  He did not complain of any chest pain.    Assessment/Plan:   Principal Problem:   Acute on chronic systolic CHF (congestive heart failure) (HCC) Active Problems:   CAD (coronary artery disease)   HTN (hypertension)   Hyperlipidemia   BPH (benign prostatic hyperplasia)   NSTEMI (non-ST elevated myocardial infarction) (HCC)   AKI (acute kidney injury) (HCC)   Normocytic anemia   HLD (hyperlipidemia)   CKD (chronic kidney disease), stage IV (HCC)   Chronic respiratory failure with hypoxia (HCC)   Body mass index is 28.32 kg/m.  Acute on chronic systolic CHF: He is off Lasix because of worsening AKI.  Monitor daily weights.  CAD with recent NSTEMI on 05/28/2021, elevated troponins:   Latest Reference Range & Units 06/10/21 09:50 06/10/21 12:30 06/10/21 19:14 06/11/21 02:06 06/11/21 06:06 06/11/21 10:03 06/11/21 11:24  Troponin I (High Sensitivity) <18 ng/L 525 (HH) 858 (HH) 2,587 (HH) 3,025 (HH) 3,184 (HH) 3,038 (HH) 3,324 (HH)  (HH):  Elevated  troponin suspected to be due to demand ischemia per cardiologist.  AKI on CKD stage IV: He has been started on low rate IV fluids (Ringer's lactate infusion).  Consult Dr. Dr. Juleen China, nephrologist, to assist with management.  Monitor BMP and urine output.  Chronic hypoxic respiratory failure: Continue 2 L/min oxygen via nasal cannula  Leukocytosis: Improving.  This is likely reactive.   Other comorbidities include hypertension, hyperlipidemia, BPH, chronic normocytic anemia, asthma, chronic thrombocytopenia    Diet Order             Diet 2 gram sodium Room service appropriate? Yes; Fluid consistency: Thin  Diet effective now                   Consultants: Cardiologist  Procedures: None    Medications:    amLODipine  10 mg Oral Daily   aspirin EC  81 mg Oral Daily   atorvastatin  40 mg Oral q1800   calcitRIOL  0.25 mcg Oral Daily   carvedilol  6.25 mg Oral BID WC   citalopram  20 mg Oral Daily   finasteride  5 mg Oral Daily   hydrALAZINE  100 mg Oral TID   isosorbide mononitrate  60 mg Oral Daily   pantoprazole  40 mg Oral Daily   tamsulosin  0.4 mg Oral Daily   umeclidinium bromide  1 puff Inhalation Daily   cyanocobalamin  1,000 mcg Oral Daily   Continuous Infusions:  lactated ringers 50 mL/hr at 06/13/21 1216     Anti-infectives (From admission,  onward)    None              Family Communication/Anticipated D/C date and plan/Code Status   DVT prophylaxis: SCDs Start: 06/10/21 0920     Code Status: Full Code  Family Communication: None Disposition Plan: Plan to discharge home in 2 to 3 days   Status is: Inpatient Remains inpatient appropriate because: Shortness of breath               Subjective:   C/o shortness of breath with exertion.  No chest pain  Objective:    Vitals:   06/13/21 0347 06/13/21 0628 06/13/21 0806 06/13/21 1116  BP: (!) 154/60  (!) 144/60 (!) 117/49  Pulse: 70  68 (!) 103  Resp: 18  16 18    Temp: 98.2 F (36.8 C)  98.2 F (36.8 C) 98.2 F (36.8 C)  TempSrc:      SpO2: 98%  99% 97%  Weight:  92.1 kg    Height:       No data found.   Intake/Output Summary (Last 24 hours) at 06/13/2021 1230 Last data filed at 06/13/2021 1000 Gross per 24 hour  Intake 240 ml  Output 0 ml  Net 240 ml   Filed Weights   06/11/21 0352 06/12/21 0500 06/13/21 0628  Weight: 90.7 kg 92.4 kg 92.1 kg    Exam:  GEN: NAD SKIN: No rash EYES: EOMI ENT: MMM CV: RRR PULM: CTA B ABD: soft, ND, NT, +BS CNS: AAO x 3, non focal EXT: No edema or tenderness          Data Reviewed:   I have personally reviewed following labs and imaging studies:  Labs: Labs show the following:   Basic Metabolic Panel: Recent Labs  Lab 06/10/21 0700 06/11/21 0206 06/12/21 0806 06/13/21 0532  NA 139 140 135 136  K 3.8 3.9 3.9 4.0  CL 107 109 106 105  CO2 23 22 21* 22  GLUCOSE 125* 100* 106* 105*  BUN 62* 62* 73* 78*  CREATININE 3.76* 3.87* 4.54* 4.89*  CALCIUM 8.3* 8.0* 7.9* 7.9*  MG  --  1.7  --   --    GFR Estimated Creatinine Clearance: 13.7 mL/min (A) (by C-G formula based on SCr of 4.89 mg/dL (H)). Liver Function Tests: No results for input(s): AST, ALT, ALKPHOS, BILITOT, PROT, ALBUMIN in the last 168 hours. No results for input(s): LIPASE, AMYLASE in the last 168 hours. No results for input(s): AMMONIA in the last 168 hours. Coagulation profile No results for input(s): INR, PROTIME in the last 168 hours.  CBC: Recent Labs  Lab 06/10/21 0700 06/11/21 0206 06/12/21 0806 06/13/21 0532  WBC 13.8* 18.3* 13.6* 11.1*  NEUTROABS 12.4*  --  11.3* 9.1*  HGB 10.0* 8.4* 8.8* 8.4*  HCT 31.3* 26.2* 27.1* 25.8*  MCV 92.3 90.3 89.7 89.9  PLT 142* 109* 114* 107*   Cardiac Enzymes: No results for input(s): CKTOTAL, CKMB, CKMBINDEX, TROPONINI in the last 168 hours. BNP (last 3 results) No results for input(s): PROBNP in the last 8760 hours. CBG: No results for input(s): GLUCAP in the  last 168 hours. D-Dimer: No results for input(s): DDIMER in the last 72 hours. Hgb A1c: Recent Labs    06/10/21 1914  HGBA1C 5.2   Lipid Profile: No results for input(s): CHOL, HDL, LDLCALC, TRIG, CHOLHDL, LDLDIRECT in the last 72 hours. Thyroid function studies: No results for input(s): TSH, T4TOTAL, T3FREE, THYROIDAB in the last 72 hours.  Invalid input(s): FREET3 Anemia  work up: No results for input(s): VITAMINB12, FOLATE, FERRITIN, TIBC, IRON, RETICCTPCT in the last 72 hours. Sepsis Labs: Recent Labs  Lab 06/10/21 0700 06/11/21 0206 06/12/21 0806 06/13/21 0532  PROCALCITON <0.10  --   --   --   WBC 13.8* 18.3* 13.6* 11.1*    Microbiology Recent Results (from the past 240 hour(s))  Resp Panel by RT-PCR (Flu A&B, Covid) Nasopharyngeal Swab     Status: None   Collection Time: 06/10/21  7:00 AM   Specimen: Nasopharyngeal Swab; Nasopharyngeal(NP) swabs in vial transport medium  Result Value Ref Range Status   SARS Coronavirus 2 by RT PCR NEGATIVE NEGATIVE Final    Comment: (NOTE) SARS-CoV-2 target nucleic acids are NOT DETECTED.  The SARS-CoV-2 RNA is generally detectable in upper respiratory specimens during the acute phase of infection. The lowest concentration of SARS-CoV-2 viral copies this assay can detect is 138 copies/mL. A negative result does not preclude SARS-Cov-2 infection and should not be used as the sole basis for treatment or other patient management decisions. A negative result may occur with  improper specimen collection/handling, submission of specimen other than nasopharyngeal swab, presence of viral mutation(s) within the areas targeted by this assay, and inadequate number of viral copies(<138 copies/mL). A negative result must be combined with clinical observations, patient history, and epidemiological information. The expected result is Negative.  Fact Sheet for Patients:  EntrepreneurPulse.com.au  Fact Sheet for Healthcare  Providers:  IncredibleEmployment.be  This test is no t yet approved or cleared by the [place] FDA and  has been authorized for detection and/or diagnosis of SARS-CoV-2 by FDA under an Emergency Use Authorization (EUA). This EUA will remain  in effect (meaning this test can be used) for the duration of the COVID-19 declaration under Section 564(b)(1) of the Act, 21 U.S.C.section 360bbb-3(b)(1), unless the authorization is terminated  or revoked sooner.       Influenza A by PCR NEGATIVE NEGATIVE Final   Influenza B by PCR NEGATIVE NEGATIVE Final    Comment: (NOTE) The Xpert Xpress SARS-CoV-2/FLU/RSV plus assay is intended as an aid in the diagnosis of influenza from Nasopharyngeal swab specimens and should not be used as a sole basis for treatment. Nasal washings and aspirates are unacceptable for Xpert Xpress SARS-CoV-2/FLU/RSV testing.  Fact Sheet for Patients: EntrepreneurPulse.com.au  Fact Sheet for Healthcare Providers: IncredibleEmployment.be  This test is not yet approved or cleared by the [place] FDA and has been authorized for detection and/or diagnosis of SARS-CoV-2 by FDA under an Emergency Use Authorization (EUA). This EUA will remain in effect (meaning this test can be used) for the duration of the COVID-19 declaration under Section 564(b)(1) of the Act, 21 U.S.C. section 360bbb-3(b)(1), unless the authorization is terminated or revoked.  Performed at [facility], [address] [place], [place] [address]     Procedures and diagnostic studies:  No results found.             LOS: 3 days   Kyonna Frier  [facility]   Pager on [email or web address]. If 7PM-7AM, please contact night-coverage at [email or web address]     06/13/2021, 12:30 PM

## 2021-06-13 NOTE — Progress Notes (Signed)
[facility]  ROUNDING NOTE   Subjective:   Peter [name] is a 81 year old male with past medical conditions including CAD, hypertension, asthma, hyperlipidemia, systolic heart failure, GERD, AAA repair, and chronic kidney disease stage IV.  Patient presents to the emergency department with complaints of shortness of breath.  Patient has been admitted for Shortness of breath [R06.02] SOB (shortness of breath) [R06.02] Elevated troponin [R77.8] Acute on chronic systolic CHF (congestive heart failure) (HCC) [I50.23] Congestive heart failure, unspecified HF chronicity, unspecified heart failure type [facility]) [I50.9]  Patient is known to our practice and is followed by Dr. Juleen China.  Patient recently had admission for the same complaint.  Patient reports shortness of breath started last night and has worsened this morning.  Denies chest pain.  Reports nausea and poor appetite.  Denies vomiting or diarrhea.  He has continued to take all medications during this time.  Also reports productive cough with clear sputum.  Denies dark stools.  Reports he has not smoked in 3 months.  Troponins remain elevated, however are starting to decrease.  Creatinine continues to increase along with BUN.  Chest x-ray shows no acute changes.Previous echo from prior admission shows EF 40 to 45% with mildly decreased left ventricular function and mild LVH.  We have been consulted to monitor acute kidney injury.  Objective:  Vital signs in last 24 hours:  Temp:  [98 F (36.7 C)-98.5 F (36.9 C)] 98.2 F (36.8 C) (03/03 1116) Pulse Rate:  [59-103] 103 (03/03 1116) Resp:  [16-20] 18 (03/03 1116) BP: (117-154)/(46-60) 117/49 (03/03 1116) SpO2:  [96 %-99 %] 97 % (03/03 1116) Weight:  [92.1 kg] 92.1 kg (03/03 0628)  Weight change: -0.3 kg Filed Weights   06/11/21 0352 06/12/21 0500 06/13/21 0628  Weight: 90.7 kg 92.4 kg 92.1 kg    Intake/Output: I/O last 3 completed shifts: In: -  Out: 250  [Urine:250]   Intake/Output this shift:  Total I/O In: 360 [P.O.:360] Out: -   Physical Exam: General: NAD, resting quietly  Head: Normocephalic, atraumatic. Moist oral mucosal membranes  Eyes: Anicteric  Lungs:  Clear to auscultation, normal effort, room air  Heart: Regular rate and rhythm  Abdomen:  Soft, nontender, nondistended  Extremities: No peripheral edema.  Neurologic: Nonfocal, moving all four extremities  Skin: No lesions       Basic Metabolic Panel: Recent Labs  Lab 06/10/21 0700 06/11/21 0206 06/12/21 0806 06/13/21 0532  NA 139 140 135 136  K 3.8 3.9 3.9 4.0  CL 107 109 106 105  CO2 23 22 21* 22  GLUCOSE 125* 100* 106* 105*  BUN 62* 62* 73* 78*  CREATININE 3.76* 3.87* 4.54* 4.89*  CALCIUM 8.3* 8.0* 7.9* 7.9*  MG  --  1.7  --   --     Liver Function Tests: No results for input(s): AST, ALT, ALKPHOS, BILITOT, PROT, ALBUMIN in the last 168 hours. No results for input(s): LIPASE, AMYLASE in the last 168 hours. No results for input(s): AMMONIA in the last 168 hours.  CBC: Recent Labs  Lab 06/10/21 0700 06/11/21 0206 06/12/21 0806 06/13/21 0532  WBC 13.8* 18.3* 13.6* 11.1*  NEUTROABS 12.4*  --  11.3* 9.1*  HGB 10.0* 8.4* 8.8* 8.4*  HCT 31.3* 26.2* 27.1* 25.8*  MCV 92.3 90.3 89.7 89.9  PLT 142* 109* 114* 107*    Cardiac Enzymes: No results for input(s): CKTOTAL, CKMB, CKMBINDEX, TROPONINI in the last 168 hours.  BNP: Invalid input(s): POCBNP  CBG: No results for input(s): GLUCAP  in the last 168 hours.  Microbiology: Results for orders placed or performed during the hospital encounter of 06/10/21  Resp Panel by RT-PCR (Flu A&B, Covid) Nasopharyngeal Swab     Status: None   Collection Time: 06/10/21  7:00 AM   Specimen: Nasopharyngeal Swab; Nasopharyngeal(NP) swabs in vial transport medium  Result Value Ref Range Status   SARS Coronavirus 2 by RT PCR NEGATIVE NEGATIVE Final    Comment: (NOTE) SARS-CoV-2 target nucleic acids are NOT  DETECTED.  The SARS-CoV-2 RNA is generally detectable in upper respiratory specimens during the acute phase of infection. The lowest concentration of SARS-CoV-2 viral copies this assay can detect is 138 copies/mL. A negative result does not preclude SARS-Cov-2 infection and should not be used as the sole basis for treatment or other patient management decisions. A negative result may occur with  improper specimen collection/handling, submission of specimen other than nasopharyngeal swab, presence of viral mutation(s) within the areas targeted by this assay, and inadequate number of viral copies(<138 copies/mL). A negative result must be combined with clinical observations, patient history, and epidemiological information. The expected result is Negative.  Fact Sheet for Patients:  EntrepreneurPulse.com.au  Fact Sheet for Healthcare Providers:  IncredibleEmployment.be  This test is no t yet approved or cleared by the [place] FDA and  has been authorized for detection and/or diagnosis of SARS-CoV-2 by FDA under an Emergency Use Authorization (EUA). This EUA will remain  in effect (meaning this test can be used) for the duration of the COVID-19 declaration under Section 564(b)(1) of the Act, 21 U.S.C.section 360bbb-3(b)(1), unless the authorization is terminated  or revoked sooner.       Influenza A by PCR NEGATIVE NEGATIVE Final   Influenza B by PCR NEGATIVE NEGATIVE Final    Comment: (NOTE) The Xpert Xpress SARS-CoV-2/FLU/RSV plus assay is intended as an aid in the diagnosis of influenza from Nasopharyngeal swab specimens and should not be used as a sole basis for treatment. Nasal washings and aspirates are unacceptable for Xpert Xpress SARS-CoV-2/FLU/RSV testing.  Fact Sheet for Patients: EntrepreneurPulse.com.au  Fact Sheet for Healthcare Providers: IncredibleEmployment.be  This test is not yet  approved or cleared by the [place] FDA and has been authorized for detection and/or diagnosis of SARS-CoV-2 by FDA under an Emergency Use Authorization (EUA). This EUA will remain in effect (meaning this test can be used) for the duration of the COVID-19 declaration under Section 564(b)(1) of the Act, 21 U.S.C. section 360bbb-3(b)(1), unless the authorization is terminated or revoked.  Performed at [facility], [address] [place],  [address]     Coagulation Studies: No results for input(s): LABPROT, INR in the last 72 hours.  Urinalysis: No results for input(s): COLORURINE, LABSPEC, PHURINE, GLUCOSEU, HGBUR, BILIRUBINUR, KETONESUR, PROTEINUR, UROBILINOGEN, NITRITE, LEUKOCYTESUR in the last 72 hours.  Invalid input(s): APPERANCEUR    Imaging: No results found.   Medications:    lactated ringers 50 mL/hr at 06/13/21 1216    amLODipine  10 mg Oral Daily   aspirin EC  81 mg Oral Daily   atorvastatin  40 mg Oral q1800   calcitRIOL  0.25 mcg Oral Daily   carvedilol  6.25 mg Oral BID WC   citalopram  20 mg Oral Daily   finasteride  5 mg Oral Daily   hydrALAZINE  100 mg Oral TID   isosorbide mononitrate  60 mg Oral Daily   pantoprazole  40 mg Oral Daily   tamsulosin  0.4 mg Oral Daily  umeclidinium bromide  1 puff Inhalation Daily   cyanocobalamin  1,000 mcg Oral Daily   acetaminophen, albuterol, dextromethorphan-guaiFENesin, hydrALAZINE, loratadine, morphine injection, nitroGLYCERIN, ondansetron (ZOFRAN) IV, sodium chloride  Assessment/ Plan:  Mr. EDEN RHO is a 81 y.o.  male male with past medical conditions including CAD, hypertension, asthma, hyperlipidemia, systolic heart failure, GERD, AAA repair, and chronic kidney disease stage IV.  Patient presents to the emergency department with complaints of shortness of breath.  Patient has been admitted for Shortness of breath [R06.02] SOB (shortness of breath) [R06.02] Elevated troponin  [R77.8] Acute on chronic systolic CHF (congestive heart failure) (HCC) [I50.23] Congestive heart failure, unspecified HF chronicity, unspecified heart failure type ([place]) [I50.9]   Acute Kidney Injury on chronic kidney disease stage IV with baseline creatinine 3.4 and GFR of 16 on 05/30/21.  Acute kidney injury secondary to what appears to be cardiorenal syndrome Losartan currently held.  No IV contrast exposure.  No acute indication for dialysis.  Creatinine continues to increase during this hospitalization.  Currently receiving IV fluids, LR at 75 mL/h.  We will monitor this closely to avoid fluid overload.  Patient would be agreeable to dialysis if needed.  Lab Results  Component Value Date   CREATININE 4.89 (H) 06/13/2021   CREATININE 4.54 (H) 06/12/2021   CREATININE 3.87 (H) 06/11/2021    Intake/Output Summary (Last 24 hours) at 06/13/2021 1532 Last data filed at 06/13/2021 1400 Gross per 24 hour  Intake 360 ml  Output 0 ml  Net 360 ml   2.  Hypertension with chronic kidney disease.  Home regimen includes carvedilol, furosemide, hydralazine, isosorbide, losartan and spironolactone.  Diuretics currently held.  3.  Chronic systolic heart failure.Previous echo from prior admission shows EF 40 to 45% with mildly decreased left ventricular function and mild LVH.  Cardiology following and recommending conservative management.    LOS: 3 Graycen Sadlon 3/3/20233:32 PM

## 2021-06-13 NOTE — Progress Notes (Signed)
PPD Placement note ?Peter [name], 81 y.o. male is here today for placement of PPD test ?Reason for PPD test: Nursing home placement- [facility]  ?Pt taken PPD test before: no ?Verified in allergy area and with patient that they are not allergic to the products PPD is made of (Phenol or Tween). Yes ?Is patient taking any oral or IV steroid medication now or have they taken it in the last month? no ?Has the patient ever received the BCG vaccine?: no ?Has the patient been in recent contact with anyone known or suspected of having active TB disease?: no ?     Date of exposure (if applicable): n/a  ?     Name of person they were exposed to (if applicable): n/a  ?Patient's Country of origin?: n/a ?O: Alert and oriented in NAD. ?P:  PPD placed on 06/13/2021.  Patient advised to return for reading within 48-72 hours.  ?

## 2021-06-14 DIAGNOSIS — N179 Acute kidney failure, unspecified: Secondary | ICD-10-CM | POA: Diagnosis not present

## 2021-06-14 DIAGNOSIS — N184 Chronic kidney disease, stage 4 (severe): Secondary | ICD-10-CM | POA: Diagnosis not present

## 2021-06-14 DIAGNOSIS — I5023 Acute on chronic systolic (congestive) heart failure: Secondary | ICD-10-CM | POA: Diagnosis not present

## 2021-06-14 DIAGNOSIS — J9611 Chronic respiratory failure with hypoxia: Secondary | ICD-10-CM | POA: Diagnosis not present

## 2021-06-14 LAB — BASIC METABOLIC PANEL
Anion gap: 10 (ref 5–15)
BUN: 85 mg/dL — ABNORMAL HIGH (ref 8–23)
CO2: 22 mmol/L (ref 22–32)
Calcium: 8.1 mg/dL — ABNORMAL LOW (ref 8.9–10.3)
Chloride: 106 mmol/L (ref 98–111)
Creatinine, Ser: 4.99 mg/dL — ABNORMAL HIGH (ref 0.61–1.24)
GFR, Estimated: 11 mL/min — ABNORMAL LOW (ref >60)
Glucose, Bld: 105 mg/dL — ABNORMAL HIGH (ref 70–99)
Potassium: 4 mmol/L (ref 3.5–5.1)
Sodium: 138 mmol/L (ref 135–145)

## 2021-06-14 NOTE — Progress Notes (Signed)
[facility]  ROUNDING NOTE   Subjective:   Peter [name] is a 81 year old male with past medical conditions including CAD, hypertension, asthma, hyperlipidemia, systolic heart failure, GERD, AAA repair, and chronic kidney disease stage IV.  Patient presents to the emergency department with complaints of shortness of breath.  Patient has been admitted for Shortness of breath [R06.02] SOB (shortness of breath) [R06.02] Elevated troponin [R77.8] Acute on chronic systolic CHF (congestive heart failure) (HCC) [I50.23] Congestive heart failure, unspecified HF chronicity, unspecified heart failure type [facility]) [I50.9]  Patient is known to our practice and is followed by Dr. Wynelle Link.  Patient recently had admission for the same complaint.  We have been consulted to monitor acute kidney injury.  Patient seen today on second floor patient resting comfortably   Objective:  Vital signs in last 24 hours:  Temp:  [97.6 F (36.4 C)-98.3 F (36.8 C)] 97.6 F (36.4 C) (03/04 1133) Pulse Rate:  [48-100] 48 (03/04 1133) Resp:  [16-20] 18 (03/04 1133) BP: (123-148)/(43-66) 127/51 (03/04 1133) SpO2:  [96 %-97 %] 97 % (03/04 1133) Weight:  [93.6 kg] 93.6 kg (03/04 0418)  Weight change: 1.477 kg Filed Weights   06/12/21 0500 06/13/21 0628 06/14/21 0418  Weight: 92.4 kg 92.1 kg 93.6 kg    Intake/Output: I/O last 3 completed shifts: In: 1773.3 [P.O.:600; I.V.:1173.3] Out: 600 [Urine:600]   Intake/Output this shift:  No intake/output data recorded.  Physical Exam: General: NAD, resting quietly  Head: Normocephalic, atraumatic. Moist oral mucosal membranes  Eyes: Anicteric  Lungs:  Clear to auscultation, normal effort, room air  Heart: Regular rate and rhythm  Abdomen:  Soft, nontender, nondistended  Extremities: No peripheral edema.  Neurologic: Nonfocal, moving all four extremities  Skin: No lesions       Basic Metabolic Panel: Recent Labs  Lab 06/10/21 0700  06/11/21 0206 06/12/21 0806 06/13/21 0532 06/14/21 0540  NA 139 140 135 136 138  K 3.8 3.9 3.9 4.0 4.0  CL 107 109 106 105 106  CO2 23 22 21* 22 22  GLUCOSE 125* 100* 106* 105* 105*  BUN 62* 62* 73* 78* 85*  CREATININE 3.76* 3.87* 4.54* 4.89* 4.99*  CALCIUM 8.3* 8.0* 7.9* 7.9* 8.1*  MG  --  1.7  --   --   --     Liver Function Tests: No results for input(s): AST, ALT, ALKPHOS, BILITOT, PROT, ALBUMIN in the last 168 hours. No results for input(s): LIPASE, AMYLASE in the last 168 hours. No results for input(s): AMMONIA in the last 168 hours.  CBC: Recent Labs  Lab 06/10/21 0700 06/11/21 0206 06/12/21 0806 06/13/21 0532  WBC 13.8* 18.3* 13.6* 11.1*  NEUTROABS 12.4*  --  11.3* 9.1*  HGB 10.0* 8.4* 8.8* 8.4*  HCT 31.3* 26.2* 27.1* 25.8*  MCV 92.3 90.3 89.7 89.9  PLT 142* 109* 114* 107*    Cardiac Enzymes: No results for input(s): CKTOTAL, CKMB, CKMBINDEX, TROPONINI in the last 168 hours.  BNP: Invalid input(s): POCBNP  CBG: No results for input(s): GLUCAP in the last 168 hours.  Microbiology: Results for orders placed or performed during the hospital encounter of 06/10/21  Resp Panel by RT-PCR (Flu A&B, Covid) Nasopharyngeal Swab     Status: None   Collection Time: 06/10/21  7:00 AM   Specimen: Nasopharyngeal Swab; Nasopharyngeal(NP) swabs in vial transport medium  Result Value Ref Range Status   SARS Coronavirus 2 by RT PCR NEGATIVE NEGATIVE Final    Comment: (NOTE) SARS-CoV-2 target nucleic acids are NOT  DETECTED.  The SARS-CoV-2 RNA is generally detectable in upper respiratory specimens during the acute phase of infection. The lowest concentration of SARS-CoV-2 viral copies this assay can detect is 138 copies/mL. A negative result does not preclude SARS-Cov-2 infection and should not be used as the sole basis for treatment or other patient management decisions. A negative result may occur with  improper specimen collection/handling, submission of specimen  other than nasopharyngeal swab, presence of viral mutation(s) within the areas targeted by this assay, and inadequate number of viral copies(<138 copies/mL). A negative result must be combined with clinical observations, patient history, and epidemiological information. The expected result is Negative.  Fact Sheet for Patients:  BloggerCourse.com  Fact Sheet for Healthcare Providers:  SeriousBroker.it  This test is no t yet approved or cleared by the [place] FDA and  has been authorized for detection and/or diagnosis of SARS-CoV-2 by FDA under an Emergency Use Authorization (EUA). This EUA will remain  in effect (meaning this test can be used) for the duration of the COVID-19 declaration under Section 564(b)(1) of the Act, 21 U.S.C.section 360bbb-3(b)(1), unless the authorization is terminated  or revoked sooner.       Influenza A by PCR NEGATIVE NEGATIVE Final   Influenza B by PCR NEGATIVE NEGATIVE Final    Comment: (NOTE) The Xpert Xpress SARS-CoV-2/FLU/RSV plus assay is intended as an aid in the diagnosis of influenza from Nasopharyngeal swab specimens and should not be used as a sole basis for treatment. Nasal washings and aspirates are unacceptable for Xpert Xpress SARS-CoV-2/FLU/RSV testing.  Fact Sheet for Patients: BloggerCourse.com  Fact Sheet for Healthcare Providers: SeriousBroker.it  This test is not yet approved or cleared by the [place] FDA and has been authorized for detection and/or diagnosis of SARS-CoV-2 by FDA under an Emergency Use Authorization (EUA). This EUA will remain in effect (meaning this test can be used) for the duration of the COVID-19 declaration under Section 564(b)(1) of the Act, 21 U.S.C. section 360bbb-3(b)(1), unless the authorization is terminated or revoked.  Performed at [facility], [address]  [place], [place] [address]     Coagulation Studies: No results for input(s): LABPROT, INR in the last 72 hours.  Urinalysis: No results for input(s): COLORURINE, LABSPEC, PHURINE, GLUCOSEU, HGBUR, BILIRUBINUR, KETONESUR, PROTEINUR, UROBILINOGEN, NITRITE, LEUKOCYTESUR in the last 72 hours.  Invalid input(s): APPERANCEUR    Imaging: No results found.   Medications:      amLODipine  10 mg Oral Daily   aspirin EC  81 mg Oral Daily   atorvastatin  40 mg Oral q1800   calcitRIOL  0.25 mcg Oral Daily   carvedilol  6.25 mg Oral BID WC   citalopram  20 mg Oral Daily   finasteride  5 mg Oral Daily   hydrALAZINE  100 mg Oral TID   isosorbide mononitrate  60 mg Oral Daily   pantoprazole  40 mg Oral Daily   sodium chloride flush  3 mL Intravenous Q12H   tamsulosin  0.4 mg Oral Daily   tuberculin  5 Units Intradermal Once   umeclidinium bromide  1 puff Inhalation Daily   cyanocobalamin  1,000 mcg Oral Daily   acetaminophen, albuterol, dextromethorphan-guaiFENesin, hydrALAZINE, loratadine, morphine injection, nitroGLYCERIN, ondansetron (ZOFRAN) IV, sodium chloride  Assessment/ Plan:  [name] is a 81 y.o.  male male with past medical conditions including CAD, hypertension, asthma, hyperlipidemia, systolic heart failure, GERD, AAA repair, and chronic kidney disease stage IV.  Patient presents to the  emergency department with complaints of shortness of breath.  Patient has been admitted for Shortness of breath [R06.02] SOB (shortness of breath) [R06.02] Elevated troponin [R77.8] Acute on chronic systolic CHF (congestive heart failure) (HCC) [I50.23] Congestive heart failure, unspecified HF chronicity, unspecified heart failure type (HCC) [I50.9]   Acute Kidney Injury on chronic kidney disease stage IV with baseline creatinine 3.4 and GFR of 16 on 05/30/21.  Acute kidney injury secondary to what appears to be cardiorenal syndrome Losartan currently held.  No IV contrast exposure.   No acute indication for dialysis.          Patient creatinine is currently stable at around 4.9.  Patient creatinine is most likely near plateau          We will continue to follow closely about need for renal replacement therapy           Currently receiving IV fluids, LR at 75 mL/h.             We will monitor this closely to avoid fluid overload.  Patient would be agreeable to dialysis if needed.  Lab Results  Component Value Date   CREATININE 4.99 (H) 06/14/2021   CREATININE 4.89 (H) 06/13/2021   CREATININE 4.54 (H) 06/12/2021    Intake/Output Summary (Last 24 hours) at 06/14/2021 1458 Last data filed at 06/14/2021 1404 Gross per 24 hour  Intake 1413.28 ml  Output 600 ml  Net 813.28 ml   2.  Hypertension with chronic kidney disease.  Home regimen includes carvedilol, furosemide, hydralazine, isosorbide, losartan and spironolactone.  Diuretics currently held.  3.  Chronic systolic heart failure.Previous echo from prior admission shows EF 40 to 45% with mildly decreased left ventricular function and mild LVH.  Cardiology following and recommending conservative management.  4.Anemia of chronic disease Patient hemoglobin is low Patient iron saturation when checked on February 15 were within normal limits We will follow closely and then decide further need for Epogen   LOS: 4 Sylva Overley s [facility] 3/4/20232:58 PM

## 2021-06-14 NOTE — Progress Notes (Signed)
Notified of run NSVT.  RN in room with patient.  Denies pain or SOB.  No distress noted.  Rachael Fee, NP on floor and notified.  No new orders.  Will continue to monitor. ?

## 2021-06-14 NOTE — Progress Notes (Addendum)
Progress Note    Peter [name]  OVF:[id number] DOB: 1941/01/01  DOA: 06/10/2021 PCP: Tracie Harrier, MD      Brief Narrative:    Medical records reviewed and are as summarized below:  Peter [name] is a 81 y.o. male with medical history significant of sCHF with EF 40-45%, CAD with multiple stent placement, hypertension, hyperlipidemia, asthma, as needed oxygen at home, GERD, depression, AAA repair, aortic arch aneurysm, former smoker (recently quit smoking), CKD-4, AICD placement. Patient was recently hospitalized from 2/15 - 2/22 due to chest pain and non-STEMI.  He also had black stool bowel movement, GI was consulted, EGD was done, which showed gastritis.  Patient's Plavix is on hold.    He presented to the hospital because of shortness of breath and cough.   He was admitted to the hospital for acute on chronic systolic CHF. Troponins were also significantly elevated but this was attributed to demand ischemia.  He did not complain of any chest pain.    Assessment/Plan:   Principal Problem:   Acute on chronic systolic CHF (congestive heart failure) (HCC) Active Problems:   CAD (coronary artery disease)   HTN (hypertension)   Hyperlipidemia   BPH (benign prostatic hyperplasia)   NSTEMI (non-ST elevated myocardial infarction) (HCC)   AKI (acute kidney injury) (HCC)   Normocytic anemia   HLD (hyperlipidemia)   CKD (chronic kidney disease), stage IV (HCC)   Chronic respiratory failure with hypoxia (HCC)   Body mass index is 28.77 kg/m.  Acute on chronic systolic CHF: Lasix on hold.  CAD with recent NSTEMI on 05/28/2021, elevated troponins:   Latest Reference Range & Units 06/10/21 09:50 06/10/21 12:30 06/10/21 19:14 06/11/21 02:06 06/11/21 06:06 06/11/21 10:03 06/11/21 11:24  Troponin I (High Sensitivity) <18 ng/L 525 (HH) 858 (HH) 2,587 (HH) 3,025 (HH) 3,184 (HH) 3,038 (HH) 3,324 (HH)  (HH):  Elevated troponin suspected to be due to demand ischemia per  cardiologist.  AKI on CKD stage IV: Creatinine is getting worse despite IV fluids.  Continue low rate IV fluids.  Monitor BMP.  Follow-up with nephrologist.  Chronic hypoxic respiratory failure: Continue 2 L/min oxygen via nasal cannula  Leukocytosis: Improved.  This is likely reactive.   Other comorbidities include hypertension, hyperlipidemia, BPH, chronic normocytic anemia, asthma, chronic thrombocytopenia    Diet Order             Diet 2 gram sodium Room service appropriate? Yes; Fluid consistency: Thin  Diet effective now                   Consultants: Cardiologist  Procedures: None    Medications:    amLODipine  10 mg Oral Daily   aspirin EC  81 mg Oral Daily   atorvastatin  40 mg Oral q1800   calcitRIOL  0.25 mcg Oral Daily   carvedilol  6.25 mg Oral BID WC   citalopram  20 mg Oral Daily   finasteride  5 mg Oral Daily   hydrALAZINE  100 mg Oral TID   isosorbide mononitrate  60 mg Oral Daily   pantoprazole  40 mg Oral Daily   sodium chloride flush  3 mL Intravenous Q12H   tamsulosin  0.4 mg Oral Daily   tuberculin  5 Units Intradermal Once   umeclidinium bromide  1 puff Inhalation Daily   cyanocobalamin  1,000 mcg Oral Daily   Continuous Infusions:     Anti-infectives (From admission, onward)    None  Family Communication/Anticipated D/C date and plan/Code Status   DVT prophylaxis: SCDs Start: 06/10/21 0920     Code Status: Full Code  Family Communication: Plan of care was discussed with Robyn, daughter, over the phone Disposition Plan: Plan to discharge home in 2 to 3 days   Status is: Inpatient Remains inpatient appropriate because: Shortness of breath               Subjective:   Interval events noted.  She complains of shortness of breath with exertion.  She said her breathing was labored when he was having his bath.  His childhood friend, IRA, was at the bedside  Objective:    Vitals:    06/14/21 0307 06/14/21 0418 06/14/21 0805 06/14/21 1133  BP: (!) 139/56  (!) 148/66 (!) 127/51  Pulse: 100  61 (!) 48  Resp: '18  18 18  '$ Temp: 97.7 F (36.5 C)  98.3 F (36.8 C) 97.6 F (36.4 C)  TempSrc:      SpO2: 97%  97% 97%  Weight:  93.6 kg    Height:       No data found.   Intake/Output Summary (Last 24 hours) at 06/14/2021 1142 Last data filed at 06/14/2021 0556 Gross per 24 hour  Intake 1533.28 ml  Output 600 ml  Net 933.28 ml   Filed Weights   06/12/21 0500 06/13/21 0628 06/14/21 0418  Weight: 92.4 kg 92.1 kg 93.6 kg    Exam:  GEN: NAD SKIN: No rash EYES: EOMI ENT: MMM CV: RRR PULM: CTA B ABD: soft, ND, NT, +BS CNS: AAO x 3, non focal EXT: No edema or tenderness           Data Reviewed:   I have personally reviewed following labs and imaging studies:  Labs: Labs show the following:   Basic Metabolic Panel: Recent Labs  Lab 06/10/21 0700 06/11/21 0206 06/12/21 0806 06/13/21 0532 06/14/21 0540  NA 139 140 135 136 138  K 3.8 3.9 3.9 4.0 4.0  CL 107 109 106 105 106  CO2 23 22 21* 22 22  GLUCOSE 125* 100* 106* 105* 105*  BUN 62* 62* 73* 78* 85*  CREATININE 3.76* 3.87* 4.54* 4.89* 4.99*  CALCIUM 8.3* 8.0* 7.9* 7.9* 8.1*  MG  --  1.7  --   --   --    GFR Estimated Creatinine Clearance: 13.6 mL/min (A) (by C-G formula based on SCr of 4.99 mg/dL (H)). Liver Function Tests: No results for input(s): AST, ALT, ALKPHOS, BILITOT, PROT, ALBUMIN in the last 168 hours. No results for input(s): LIPASE, AMYLASE in the last 168 hours. No results for input(s): AMMONIA in the last 168 hours. Coagulation profile No results for input(s): INR, PROTIME in the last 168 hours.  CBC: Recent Labs  Lab 06/10/21 0700 06/11/21 0206 06/12/21 0806 06/13/21 0532  WBC 13.8* 18.3* 13.6* 11.1*  NEUTROABS 12.4*  --  11.3* 9.1*  HGB 10.0* 8.4* 8.8* 8.4*  HCT 31.3* 26.2* 27.1* 25.8*  MCV 92.3 90.3 89.7 89.9  PLT 142* 109* 114* 107*   Cardiac Enzymes: No  results for input(s): CKTOTAL, CKMB, CKMBINDEX, TROPONINI in the last 168 hours. BNP (last 3 results) No results for input(s): PROBNP in the last 8760 hours. CBG: No results for input(s): GLUCAP in the last 168 hours. D-Dimer: No results for input(s): DDIMER in the last 72 hours. Hgb A1c: No results for input(s): HGBA1C in the last 72 hours.  Lipid Profile: No results for input(s): CHOL, HDL, LDLCALC,  TRIG, CHOLHDL, LDLDIRECT in the last 72 hours. Thyroid function studies: No results for input(s): TSH, T4TOTAL, T3FREE, THYROIDAB in the last 72 hours.  Invalid input(s): FREET3 Anemia work up: No results for input(s): VITAMINB12, FOLATE, FERRITIN, TIBC, IRON, RETICCTPCT in the last 72 hours. Sepsis Labs: Recent Labs  Lab 06/10/21 0700 06/11/21 0206 06/12/21 0806 06/13/21 0532  PROCALCITON <0.10  --   --   --   WBC 13.8* 18.3* 13.6* 11.1*    Microbiology Recent Results (from the past 240 hour(s))  Resp Panel by RT-PCR (Flu A&B, Covid) Nasopharyngeal Swab     Status: None   Collection Time: 06/10/21  7:00 AM   Specimen: Nasopharyngeal Swab; Nasopharyngeal(NP) swabs in vial transport medium  Result Value Ref Range Status   SARS Coronavirus 2 by RT PCR NEGATIVE NEGATIVE Final    Comment: (NOTE) SARS-CoV-2 target nucleic acids are NOT DETECTED.  The SARS-CoV-2 RNA is generally detectable in upper respiratory specimens during the acute phase of infection. The lowest concentration of SARS-CoV-2 viral copies this assay can detect is 138 copies/mL. A negative result does not preclude SARS-Cov-2 infection and should not be used as the sole basis for treatment or other patient management decisions. A negative result may occur with  improper specimen collection/handling, submission of specimen other than nasopharyngeal swab, presence of viral mutation(s) within the areas targeted by this assay, and inadequate number of viral copies(<138 copies/mL). A negative result must be combined  with clinical observations, patient history, and epidemiological information. The expected result is Negative.  Fact Sheet for Patients:  EntrepreneurPulse.com.au  Fact Sheet for Healthcare Providers:  IncredibleEmployment.be  This test is no t yet approved or cleared by the [place] FDA and  has been authorized for detection and/or diagnosis of SARS-CoV-2 by FDA under an Emergency Use Authorization (EUA). This EUA will remain  in effect (meaning this test can be used) for the duration of the COVID-19 declaration under Section 564(b)(1) of the Act, 21 U.S.C.section 360bbb-3(b)(1), unless the authorization is terminated  or revoked sooner.       Influenza A by PCR NEGATIVE NEGATIVE Final   Influenza B by PCR NEGATIVE NEGATIVE Final    Comment: (NOTE) The Xpert Xpress SARS-CoV-2/FLU/RSV plus assay is intended as an aid in the diagnosis of influenza from Nasopharyngeal swab specimens and should not be used as a sole basis for treatment. Nasal washings and aspirates are unacceptable for Xpert Xpress SARS-CoV-2/FLU/RSV testing.  Fact Sheet for Patients: EntrepreneurPulse.com.au  Fact Sheet for Healthcare Providers: IncredibleEmployment.be  This test is not yet approved or cleared by the [place] FDA and has been authorized for detection and/or diagnosis of SARS-CoV-2 by FDA under an Emergency Use Authorization (EUA). This EUA will remain in effect (meaning this test can be used) for the duration of the COVID-19 declaration under Section 564(b)(1) of the Act, 21 U.S.C. section 360bbb-3(b)(1), unless the authorization is terminated or revoked.  Performed at [facility], [address] [place], [place] [address]     Procedures and diagnostic studies:  No results found.             LOS: 4 days   Tracey Hermance  [facility]   Pager on [email or web address]. If 7PM-7AM,  please contact night-coverage at [email or web address]     06/14/2021, 11:42 AM

## 2021-06-14 NOTE — Progress Notes (Signed)
Assumed care of pt at 1900. Medicated per MAR. Full assessment per flowsheets. Pt able to make needs known. Comfort and safety maintained overnight. ?

## 2021-06-14 NOTE — TOC Progression Note (Addendum)
Transition of Care (TOC) - Progression Note  ? ? ?Patient Details  ?Name: Peter [name] ?MRN: [id number] ?Date of Birth: [birth year] ? ?Transition of Care (TOC) CM/SW Contact  ?Harriet Masson, RN ?Phone Number: [phone] ?06/14/2021, 11:57 AM ? ?Clinical Narrative:    ?Spoke with Daughter [name]) today who reports she and family will be signing paperwork today for pt to go to [facility] assisted living upon pt's discharge. Pt has agreed to go the the ALF for placement and HHPT services that has been arranged for [facility] (formally known [facility]).  Daughter aware pt maybe discharged Monday or Tuesday if he remains medical stable with the providers. ? ?TOC will remain available for any ongoing needs. ? ?Expected Discharge Plan: [place] ?Barriers to Discharge: Continued Medical Work up ? ?Expected Discharge Plan and Services ?Expected Discharge Plan: [place] ?  ?  ?Post Acute Care Choice: Resumption of Svcs/PTA Provider ?Living arrangements for the past 2 months: [place] ?                ?  ?  ?  ?  ?  ?HH Arranged: PT ?[place] Agency: [facility] ([facility]) ?Date HH Agency Contacted: 06/11/21 ?  ?Representative spoke with at [place]: Floydene Flock ? ? ?Social Determinants of Health (SDOH) Interventions ?  ? ?Readmission Risk Interventions ?Readmission Risk Prevention Plan 06/11/2021 04/28/2021  ?Transportation Screening Complete Complete  ?PCP or Specialist Appt within 3-5 Days - Complete  ?[place] or Home Care Consult - Complete  ?Social Work Consult for [place] Planning/Counseling - Complete  ?Palliative Care Screening - Not Applicable  ?Medication Review Press photographer) Complete Complete  ?PCP or Specialist appointment within 3-5 days of discharge Complete -  ?[place] or Home Care Consult Complete -  ?SW Recovery Care/Counseling Consult Complete -  ?Palliative Care Screening Not Applicable -  ?[place] Not Applicable -  ?Some  recent data might be hidden  ? ? ?

## 2021-06-14 NOTE — Progress Notes (Signed)
[facility] Cardiology ? ?SUBJECTIVE: Patient laying in bed, reports feeling better overall, denies chest pain, with chronic exertional dyspnea noted when ambulating in the halls ? ? ?Vitals:  ? 06/13/21 1921 06/13/21 2349 06/14/21 4098 06/14/21 0418  ?BP: 126/64 (!) 125/43 (!) 139/56   ?Pulse: 60 66 100   ?Resp: '16 20 18   '$ ?Temp: 98 ?F (36.7 ?C) 97.8 ?F (36.6 ?C) 97.7 ?F (36.5 ?C)   ?TempSrc: Oral     ?SpO2: 97% 96% 97%   ?Weight:    93.6 kg  ?Height:      ? ? ? ?Intake/Output Summary (Last 24 hours) at 06/14/2021 0804 ?Last data filed at 06/14/2021 0556 ?Gross per 24 hour  ?Intake 1773.28 ml  ?Output 600 ml  ?Net 1173.28 ml  ? ? ? ? ?PHYSICAL EXAM ? ?General: Well developed, well nourished, in no acute distress ?HEENT:  Normocephalic and atramatic ?Neck:  No JVD.  ?Lungs: Clear bilaterally to auscultation and percussion. ?Heart: HRRR . Normal S1 and S2 without gallops or murmurs.  ?Abdomen: Bowel sounds are positive, abdomen soft and non-tender  ?Msk:  Back normal, normal gait. Normal strength and tone for age. ?Extremities: No clubbing, cyanosis or edema.   ?Neuro: Alert and oriented X 3. ?Psych:  Good affect, responds appropriately ? ? ?LABS: ?Basic Metabolic Panel: ?Recent Labs  ?  06/13/21 ?0532 06/14/21 ?0540  ?NA 136 138  ?K 4.0 4.0  ?CL 105 106  ?CO2 22 22  ?GLUCOSE 105* 105*  ?BUN 78* 85*  ?CREATININE 4.89* 4.99*  ?CALCIUM 7.9* 8.1*  ? ?Liver Function Tests: ?No results for input(s): AST, ALT, ALKPHOS, BILITOT, PROT, ALBUMIN in the last 72 hours. ?No results for input(s): LIPASE, AMYLASE in the last 72 hours. ?CBC: ?Recent Labs  ?  06/12/21 ?0806 06/13/21 ?0532  ?WBC 13.6* 11.1*  ?NEUTROABS 11.3* 9.1*  ?HGB 8.8* 8.4*  ?HCT 27.1* 25.8*  ?MCV 89.7 89.9  ?PLT 114* 107*  ? ?Cardiac Enzymes: ?No results for input(s): CKTOTAL, CKMB, CKMBINDEX, TROPONINI in the last 72 hours. ?BNP: ?Invalid input(s): POCBNP ?D-Dimer: ?No results for input(s): DDIMER in the last 72 hours. ?Hemoglobin A1C: ?No results for input(s): HGBA1C in  the last 72 hours. ?Fasting Lipid Panel: ?No results for input(s): CHOL, HDL, LDLCALC, TRIG, CHOLHDL, LDLDIRECT in the last 72 hours. ?Thyroid Function Tests: ?No results for input(s): TSH, T4TOTAL, T3FREE, THYROIDAB in the last 72 hours. ? ?Invalid input(s): FREET3 ?Anemia Panel: ?No results for input(s): VITAMINB12, FOLATE, FERRITIN, TIBC, IRON, RETICCTPCT in the last 72 hours. ? ?No results found. ? ? ?Echo EF 40-45% 05/29/2021 ? ?TELEMETRY: A sense V pace: ? ?ASSESSMENT AND PLAN: ? ?Principal Problem: ?  Acute on chronic systolic CHF (congestive heart failure) ([place]) ?Active Problems: ?  CAD (coronary artery disease) ?  HTN (hypertension) ?  Hyperlipidemia ?  BPH (benign prostatic hyperplasia) ?  NSTEMI (non-ST elevated myocardial infarction) ([place]) ?  AKI (acute kidney injury) ([place]) ?  Normocytic anemia ?  HLD (hyperlipidemia) ?  CKD (chronic kidney disease), stage IV ([place]) ?  Chronic respiratory failure with hypoxia (HCC) ?  ? ?1.  Acute on chronic systolic congestive heart failure, HFrEF, LVEF 40 to 45%, status post CRT-D 05/2017, appears euvolemic ?2.  CAD, status post PCI x3 07/2016, currently chest pain-free ?3.  Elevated troponin, peak 3324, downtrending, in the absence of chest pain, in the setting of acute on chronic systolic congestive heart failure and acute kidney injury, currently not a candidate for cardiac catheterization ?4.  AKI /  CKD stage IV, worsening renal function, BUN and creatinine 85 and 4.99, respectively, seen by nephrology, felt to be due to cardiorenal syndrome, no acute indication for dialysis at this time ?5.  COPD, chronically on 2 L at home ? ?Recommendations ? ?1.  Agree with current therapy ?2.  Continue carvedilol ?3.  Hold ACE/ARB ?4.  Defer further dose anticoagulation ?5.  Continue isosorbide mononitrate ?6.  Hold diuretics ?7.  Continue to closely monitor renal status ?8.  Defer further cardiac diagnostics, including cardiac catheterization, in the absence of chest  pain ? ? ?[name], MD, PhD, [facility] ?06/14/2021 ?8:04 AM ? ? ? ?  ?

## 2021-06-15 DIAGNOSIS — N179 Acute kidney failure, unspecified: Secondary | ICD-10-CM | POA: Diagnosis not present

## 2021-06-15 DIAGNOSIS — N184 Chronic kidney disease, stage 4 (severe): Secondary | ICD-10-CM | POA: Diagnosis not present

## 2021-06-15 DIAGNOSIS — I5023 Acute on chronic systolic (congestive) heart failure: Secondary | ICD-10-CM | POA: Diagnosis not present

## 2021-06-15 LAB — BASIC METABOLIC PANEL
Anion gap: 12 (ref 5–15)
BUN: 84 mg/dL — ABNORMAL HIGH (ref 8–23)
CO2: 20 mmol/L — ABNORMAL LOW (ref 22–32)
Calcium: 8.1 mg/dL — ABNORMAL LOW (ref 8.9–10.3)
Chloride: 105 mmol/L (ref 98–111)
Creatinine, Ser: 5.18 mg/dL — ABNORMAL HIGH (ref 0.61–1.24)
GFR, Estimated: 11 mL/min — ABNORMAL LOW (ref >60)
Glucose, Bld: 118 mg/dL — ABNORMAL HIGH (ref 70–99)
Potassium: 3.9 mmol/L (ref 3.5–5.1)
Sodium: 137 mmol/L (ref 135–145)

## 2021-06-15 LAB — CBC WITH DIFFERENTIAL/PLATELET
Abs Immature Granulocytes: 0.04 10*3/uL (ref 0.00–0.07)
Basophils Absolute: 0 10*3/uL (ref 0.0–0.1)
Basophils Relative: 0 %
Eosinophils Absolute: 0.1 10*3/uL (ref 0.0–0.5)
Eosinophils Relative: 1 %
HCT: 26.4 % — ABNORMAL LOW (ref 39.0–52.0)
Hemoglobin: 8.6 g/dL — ABNORMAL LOW (ref 13.0–17.0)
Immature Granulocytes: 1 %
Lymphocytes Relative: 11 %
Lymphs Abs: 0.9 10*3/uL (ref 0.7–4.0)
MCH: 29 pg (ref 26.0–34.0)
MCHC: 32.6 g/dL (ref 30.0–36.0)
MCV: 88.9 fL (ref 80.0–100.0)
Monocytes Absolute: 1.2 10*3/uL — ABNORMAL HIGH (ref 0.1–1.0)
Monocytes Relative: 15 %
Neutro Abs: 5.7 10*3/uL (ref 1.7–7.7)
Neutrophils Relative %: 72 %
Platelets: 121 10*3/uL — ABNORMAL LOW (ref 150–400)
RBC: 2.97 MIL/uL — ABNORMAL LOW (ref 4.22–5.81)
RDW: 13.5 % (ref 11.5–15.5)
WBC: 7.9 10*3/uL (ref 4.0–10.5)
nRBC: 0 % (ref 0.0–0.2)

## 2021-06-15 LAB — HEPATITIS B SURFACE ANTIGEN: Hepatitis B Surface Ag: NONREACTIVE

## 2021-06-15 MED ORDER — LACTATED RINGERS IV SOLN: INTRAVENOUS | Status: AC

## 2021-06-15 MED ORDER — TORSEMIDE 20 MG PO TABS: 20.0000 mg | ORAL_TABLET | Freq: Every day | ORAL | Status: DC

## 2021-06-15 NOTE — Progress Notes (Signed)
Progress Note    Peter [name]  FBP:[id number] DOB: 1940/09/03  DOA: 06/10/2021 PCP: Tracie Harrier, MD      Brief Narrative:    Medical records reviewed and are as summarized below:  Peter [name] is a 81 y.o. male with medical history significant of sCHF with EF 40-45%, CAD with multiple stent placement, hypertension, hyperlipidemia, asthma, as needed oxygen at home, GERD, depression, AAA repair, aortic arch aneurysm, former smoker (recently quit smoking), CKD-4, AICD placement. Patient was recently hospitalized from 2/15 - 2/22 due to chest pain and non-STEMI.  He also had black stool bowel movement, GI was consulted, EGD was done, which showed gastritis.  Patient's Plavix is on hold.    He presented to the hospital because of shortness of breath and cough.   He was admitted to the hospital for acute on chronic systolic CHF. Troponins were also significantly elevated but this was attributed to demand ischemia.  He did not complain of any chest pain.    Assessment/Plan:   Principal Problem:   Acute on chronic systolic CHF (congestive heart failure) (HCC) Active Problems:   CAD (coronary artery disease)   HTN (hypertension)   Hyperlipidemia   BPH (benign prostatic hyperplasia)   NSTEMI (non-ST elevated myocardial infarction) (HCC)   AKI (acute kidney injury) (HCC)   Normocytic anemia   HLD (hyperlipidemia)   CKD (chronic kidney disease), stage IV (HCC)   Chronic respiratory failure with hypoxia (HCC)   Body mass index is 28.76 kg/m.  Acute on chronic systolic CHF: Compensated.  Lasix on hold.  CAD with recent NSTEMI on 05/28/2021, elevated troponins:   Latest Reference Range & Units 06/10/21 09:50 06/10/21 12:30 06/10/21 19:14 06/11/21 02:06 06/11/21 06:06 06/11/21 10:03 06/11/21 11:24  Troponin I (High Sensitivity) <18 ng/L 525 (HH) 858 (HH) 2,587 (HH) 3,025 (HH) 3,184 (HH) 3,038 (HH) 3,324 (HH)  (HH):  Elevated troponin suspected to be due to demand  ischemia per cardiologist.  AKI on CKD stage IV: Creatinine is still getting worse.  Nephrologist is contemplating initiating hemodialysis.  Follow-up with nephrologist for further recommendations.    Chronic hypoxic respiratory failure: Continue 2 L/min oxygen via nasal cannula  Leukocytosis: Improved.  This is likely reactive.   Other comorbidities include hypertension, hyperlipidemia, BPH, chronic normocytic anemia, asthma, chronic thrombocytopenia  Tuberculin skin test was done on 06/13/2021: report is pending  Diet Order             Diet 2 gram sodium Room service appropriate? Yes; Fluid consistency: Thin  Diet effective now                   Consultants: Cardiologist Nephrologist  Procedures: None    Medications:    amLODipine  10 mg Oral Daily   aspirin EC  81 mg Oral Daily   atorvastatin  40 mg Oral q1800   calcitRIOL  0.25 mcg Oral Daily   carvedilol  6.25 mg Oral BID WC   citalopram  20 mg Oral Daily   finasteride  5 mg Oral Daily   hydrALAZINE  100 mg Oral TID   isosorbide mononitrate  60 mg Oral Daily   pantoprazole  40 mg Oral Daily   sodium chloride flush  3 mL Intravenous Q12H   tamsulosin  0.4 mg Oral Daily   tuberculin  5 Units Intradermal Once   umeclidinium bromide  1 puff Inhalation Daily   cyanocobalamin  1,000 mcg Oral Daily   Continuous Infusions:  Anti-infectives (From admission, onward)    None              Family Communication/Anticipated D/C date and plan/Code Status   DVT prophylaxis: SCDs Start: 06/10/21 0920     Code Status: Full Code  Family Communication: None Disposition Plan: Plan to discharge to ALF in 2 to 3 days   Status is: Inpatient Remains inpatient appropriate because: Shortness of breath               Subjective:   No new complaints.  He still feels short of breath with exertion.  No chest pain.  Objective:    Vitals:   06/14/21 2339 06/15/21 0345 06/15/21 0526 06/15/21  0743  BP: (!) 125/53 131/60  (!) 145/65  Pulse: (!) 59 61  (!) 57  Resp: '20 18  18  '$ Temp: 98.1 F (36.7 C) 97.9 F (36.6 C)  97.6 F (36.4 C)  TempSrc: Oral Oral  Oral  SpO2: 98% 98%  99%  Weight:   93.5 kg   Height:       No data found.   Intake/Output Summary (Last 24 hours) at 06/15/2021 1116 Last data filed at 06/15/2021 0830 Gross per 24 hour  Intake 340 ml  Output 850 ml  Net -510 ml   Filed Weights   06/13/21 0628 06/14/21 0418 06/15/21 0526  Weight: 92.1 kg 93.6 kg 93.5 kg    Exam:  GEN: NAD SKIN: No rash EYES: EOMI ENT: MMM CV: RRR PULM: CTA B ABD: soft, ND, NT, +BS CNS: AAO x 3, non focal EXT: No edema or tenderness        Data Reviewed:   I have personally reviewed following labs and imaging studies:  Labs: Labs show the following:   Basic Metabolic Panel: Recent Labs  Lab 06/11/21 0206 06/12/21 0806 06/13/21 0532 06/14/21 0540 06/15/21 0556  NA 140 135 136 138 137  K 3.9 3.9 4.0 4.0 3.9  CL 109 106 105 106 105  CO2 22 21* 22 22 20*  GLUCOSE 100* 106* 105* 105* 118*  BUN 62* 73* 78* 85* 84*  CREATININE 3.87* 4.54* 4.89* 4.99* 5.18*  CALCIUM 8.0* 7.9* 7.9* 8.1* 8.1*  MG 1.7  --   --   --   --    GFR Estimated Creatinine Clearance: 13.1 mL/min (A) (by C-G formula based on SCr of 5.18 mg/dL (H)). Liver Function Tests: No results for input(s): AST, ALT, ALKPHOS, BILITOT, PROT, ALBUMIN in the last 168 hours. No results for input(s): LIPASE, AMYLASE in the last 168 hours. No results for input(s): AMMONIA in the last 168 hours. Coagulation profile No results for input(s): INR, PROTIME in the last 168 hours.  CBC: Recent Labs  Lab 06/10/21 0700 06/11/21 0206 06/12/21 0806 06/13/21 0532 06/15/21 0556  WBC 13.8* 18.3* 13.6* 11.1* 7.9  NEUTROABS 12.4*  --  11.3* 9.1* 5.7  HGB 10.0* 8.4* 8.8* 8.4* 8.6*  HCT 31.3* 26.2* 27.1* 25.8* 26.4*  MCV 92.3 90.3 89.7 89.9 88.9  PLT 142* 109* 114* 107* 121*   Cardiac Enzymes: No results  for input(s): CKTOTAL, CKMB, CKMBINDEX, TROPONINI in the last 168 hours. BNP (last 3 results) No results for input(s): PROBNP in the last 8760 hours. CBG: No results for input(s): GLUCAP in the last 168 hours. D-Dimer: No results for input(s): DDIMER in the last 72 hours. Hgb A1c: No results for input(s): HGBA1C in the last 72 hours.  Lipid Profile: No results for input(s): CHOL, HDL, LDLCALC, TRIG,  CHOLHDL, LDLDIRECT in the last 72 hours. Thyroid function studies: No results for input(s): TSH, T4TOTAL, T3FREE, THYROIDAB in the last 72 hours.  Invalid input(s): FREET3 Anemia work up: No results for input(s): VITAMINB12, FOLATE, FERRITIN, TIBC, IRON, RETICCTPCT in the last 72 hours. Sepsis Labs: Recent Labs  Lab 06/10/21 0700 06/11/21 0206 06/12/21 0806 06/13/21 0532 06/15/21 0556  PROCALCITON <0.10  --   --   --   --   WBC 13.8* 18.3* 13.6* 11.1* 7.9    Microbiology Recent Results (from the past 240 hour(s))  Resp Panel by RT-PCR (Flu A&B, Covid) Nasopharyngeal Swab     Status: None   Collection Time: 06/10/21  7:00 AM   Specimen: Nasopharyngeal Swab; Nasopharyngeal(NP) swabs in vial transport medium  Result Value Ref Range Status   SARS Coronavirus 2 by RT PCR NEGATIVE NEGATIVE Final    Comment: (NOTE) SARS-CoV-2 target nucleic acids are NOT DETECTED.  The SARS-CoV-2 RNA is generally detectable in upper respiratory specimens during the acute phase of infection. The lowest concentration of SARS-CoV-2 viral copies this assay can detect is 138 copies/mL. A negative result does not preclude SARS-Cov-2 infection and should not be used as the sole basis for treatment or other patient management decisions. A negative result may occur with  improper specimen collection/handling, submission of specimen other than nasopharyngeal swab, presence of viral mutation(s) within the areas targeted by this assay, and inadequate number of viral copies(<138 copies/mL). A negative result  must be combined with clinical observations, patient history, and epidemiological information. The expected result is Negative.  Fact Sheet for Patients:  EntrepreneurPulse.com.au  Fact Sheet for Healthcare Providers:  IncredibleEmployment.be  This test is no t yet approved or cleared by the [place] FDA and  has been authorized for detection and/or diagnosis of SARS-CoV-2 by FDA under an Emergency Use Authorization (EUA). This EUA will remain  in effect (meaning this test can be used) for the duration of the COVID-19 declaration under Section 564(b)(1) of the Act, 21 U.S.C.section 360bbb-3(b)(1), unless the authorization is terminated  or revoked sooner.       Influenza A by PCR NEGATIVE NEGATIVE Final   Influenza B by PCR NEGATIVE NEGATIVE Final    Comment: (NOTE) The Xpert Xpress SARS-CoV-2/FLU/RSV plus assay is intended as an aid in the diagnosis of influenza from Nasopharyngeal swab specimens and should not be used as a sole basis for treatment. Nasal washings and aspirates are unacceptable for Xpert Xpress SARS-CoV-2/FLU/RSV testing.  Fact Sheet for Patients: EntrepreneurPulse.com.au  Fact Sheet for Healthcare Providers: IncredibleEmployment.be  This test is not yet approved or cleared by the [place] FDA and has been authorized for detection and/or diagnosis of SARS-CoV-2 by FDA under an Emergency Use Authorization (EUA). This EUA will remain in effect (meaning this test can be used) for the duration of the COVID-19 declaration under Section 564(b)(1) of the Act, 21 U.S.C. section 360bbb-3(b)(1), unless the authorization is terminated or revoked.  Performed at [facility], [address] [place], [place] [address]     Procedures and diagnostic studies:  No results found.             LOS: 5 days   Kadin Canipe  [facility]   Pager on  [email or web address]. If 7PM-7AM, please contact night-coverage at [email or web address]     06/15/2021, 11:16 AM

## 2021-06-15 NOTE — Progress Notes (Signed)
PPD Results: ? ?Does patient have an induration at injection site? NO ? ?Induration (mm): 0 mm ? ?Name of Physician Notified: Mal Misty ? ? ?

## 2021-06-15 NOTE — Progress Notes (Signed)
[facility] Cardiology ? ?SUBJECTIVE: Patient laying flat in bed, reports doing well overall, continues to experience exertional dyspnea ? ? ?Vitals:  ? 06/14/21 2339 06/15/21 0345 06/15/21 1740 06/15/21 0743  ?BP: (!) 125/53 131/60  (!) 145/65  ?Pulse: (!) 59 61  (!) 57  ?Resp: '20 18  18  '$ ?Temp: 98.1 ?F (36.7 ?C) 97.9 ?F (36.6 ?C)  97.6 ?F (36.4 ?C)  ?TempSrc: Oral Oral    ?SpO2: 98% 98%  99%  ?Weight:   93.5 kg   ?Height:      ? ? ? ?Intake/Output Summary (Last 24 hours) at 06/15/2021 0902 ?Last data filed at 06/15/2021 8144 ?Gross per 24 hour  ?Intake 240 ml  ?Output 700 ml  ?Net -460 ml  ? ? ? ? ?PHYSICAL EXAM ? ?General: Well developed, well nourished, in no acute distress ?HEENT:  Normocephalic and atramatic ?Neck:  No JVD.  ?Lungs: Clear bilaterally to auscultation and percussion. ?Heart: HRRR . Normal S1 and S2 without gallops or murmurs.  ?Abdomen: Bowel sounds are positive, abdomen soft and non-tender  ?Msk:  Back normal, normal gait. Normal strength and tone for age. ?Extremities: No clubbing, cyanosis or edema.   ?Neuro: Alert and oriented X 3. ?Psych:  Good affect, responds appropriately ? ? ?LABS: ?Basic Metabolic Panel: ?Recent Labs  ?  06/14/21 ?8185 06/15/21 ?6314  ?NA 138 137  ?K 4.0 3.9  ?CL 106 105  ?CO2 22 20*  ?GLUCOSE 105* 118*  ?BUN 85* 84*  ?CREATININE 4.99* 5.18*  ?CALCIUM 8.1* 8.1*  ? ?Liver Function Tests: ?No results for input(s): AST, ALT, ALKPHOS, BILITOT, PROT, ALBUMIN in the last 72 hours. ?No results for input(s): LIPASE, AMYLASE in the last 72 hours. ?CBC: ?Recent Labs  ?  06/13/21 ?0532 06/15/21 ?9702  ?WBC 11.1* 7.9  ?NEUTROABS 9.1* 5.7  ?HGB 8.4* 8.6*  ?HCT 25.8* 26.4*  ?MCV 89.9 88.9  ?PLT 107* 121*  ? ?Cardiac Enzymes: ?No results for input(s): CKTOTAL, CKMB, CKMBINDEX, TROPONINI in the last 72 hours. ?BNP: ?Invalid input(s): POCBNP ?D-Dimer: ?No results for input(s): DDIMER in the last 72 hours. ?Hemoglobin A1C: ?No results for input(s): HGBA1C in the last 72 hours. ?Fasting Lipid  Panel: ?No results for input(s): CHOL, HDL, LDLCALC, TRIG, CHOLHDL, LDLDIRECT in the last 72 hours. ?Thyroid Function Tests: ?No results for input(s): TSH, T4TOTAL, T3FREE, THYROIDAB in the last 72 hours. ? ?Invalid input(s): FREET3 ?Anemia Panel: ?No results for input(s): VITAMINB12, FOLATE, FERRITIN, TIBC, IRON, RETICCTPCT in the last 72 hours. ? ?No results found. ? ? ?Echo LVEF 40 to 45% 05/29/2021 ? ?TELEMETRY: Predominantly A sense V pace: ? ?ASSESSMENT AND PLAN: ? ?Principal Problem: ?  Acute on chronic systolic CHF (congestive heart failure) ([place]) ?Active Problems: ?  CAD (coronary artery disease) ?  HTN (hypertension) ?  Hyperlipidemia ?  BPH (benign prostatic hyperplasia) ?  NSTEMI (non-ST elevated myocardial infarction) ([place]) ?  AKI (acute kidney injury) ([place]) ?  Normocytic anemia ?  HLD (hyperlipidemia) ?  CKD (chronic kidney disease), stage IV ([place]) ?  Chronic respiratory failure with hypoxia (HCC) ?  ? ?1. Acute on chronic systolic congestive heart failure, HFrEF, LVEF 40 to 45%, status post CRT-D 05/2017, appears euvolemic ?2.  CAD, status post PCI x3 07/2016, currently chest pain-free ?3.  Elevated troponin, peak 3324, downtrending, in the absence of chest pain, in the setting of acute on chronic systolic congestive heart failure and acute kidney injury, currently not a candidate for cardiac catheterization ?4.  AKI / CKD stage IV,  worsening renal function, BUN and creatinine 84 and 5.18, respectively, seen by nephrology, felt to be due to cardiorenal syndrome, no acute indication for dialysis at this time ?5.  COPD, chronically on 2 L at home ?6.  Anemia / GI bleed, with black tarry stool, EGD 05/30/2021 revealed gastritis, On hold ?  ?Recommendations ?  ?1.  Agree with current therapy ?2.  Continue carvedilol ?3.  Hold ACE/ARB ?4.  Defer further dose anticoagulation ?5.  Continue isosorbide mononitrate ?6.  Hold diuretics ?7.  Continue to closely monitor renal status ?8.  Defer further cardiac  diagnostics, including cardiac catheterization, in the absence of chest pain, in the setting of acute kidney injury, and recent history of anemia with GI bleed ? ? ?[name], MD, PhD, [facility] ?06/15/2021 ?9:02 AM ? ? ? ?  ?

## 2021-06-15 NOTE — Progress Notes (Signed)
[facility]  ROUNDING NOTE   Subjective:   Peter [name] is a 81 year old male with past medical conditions including CAD, hypertension, asthma, hyperlipidemia, systolic heart failure, GERD, AAA repair, and chronic kidney disease stage IV.  Patient presents to the emergency department with complaints of shortness of breath.  Patient has been admitted for Shortness of breath [R06.02] SOB (shortness of breath) [R06.02] Elevated troponin [R77.8] Acute on chronic systolic CHF (congestive heart failure) (HCC) [I50.23] Congestive heart failure, unspecified HF chronicity, unspecified heart failure type [facility]) [I50.9]  Patient is known to our practice and is followed by Dr. Juleen China.  Patient recently had admission for the same complaint.  We have been consulted to monitor acute kidney injury.  Patient seen resting quietly Alert and oriented Tolerating meals and denies shortness of breath  Creatinine currently 5.18 (4.99) Urine output 700 mL in 24 hours   Objective:  Vital signs in last 24 hours:  Temp:  [97.5 F (36.4 C)-98.1 F (36.7 C)] 97.6 F (36.4 C) (03/05 0743) Pulse Rate:  [57-65] 57 (03/05 0743) Resp:  [18-20] 18 (03/05 0743) BP: (125-145)/(45-65) 145/65 (03/05 0743) SpO2:  [98 %-99 %] 99 % (03/05 0743) Weight:  [93.5 kg] 93.5 kg (03/05 0526)  Weight change: -0.045 kg Filed Weights   06/13/21 0628 06/14/21 0418 06/15/21 0526  Weight: 92.1 kg 93.6 kg 93.5 kg    Intake/Output: I/O last 3 completed shifts: In: 1653.3 [P.O.:480; I.V.:1173.3] Out: 1050 [Urine:1050]   Intake/Output this shift:  Total I/O In: 100 [P.O.:100] Out: 150 [Urine:150]  Physical Exam: General: NAD, resting quietly  Head: Normocephalic, atraumatic. Moist oral mucosal membranes  Eyes: Anicteric  Lungs:  Clear to auscultation, normal effort, room air  Heart: Regular rate and rhythm  Abdomen:  Soft, nontender, nondistended  Extremities: Trace peripheral edema.  Neurologic:  Nonfocal, moving all four extremities  Skin: No lesions       Basic Metabolic Panel: Recent Labs  Lab 06/11/21 0206 06/12/21 0806 06/13/21 0532 06/14/21 0540 06/15/21 0556  NA 140 135 136 138 137  K 3.9 3.9 4.0 4.0 3.9  CL 109 106 105 106 105  CO2 22 21* 22 22 20*  GLUCOSE 100* 106* 105* 105* 118*  BUN 62* 73* 78* 85* 84*  CREATININE 3.87* 4.54* 4.89* 4.99* 5.18*  CALCIUM 8.0* 7.9* 7.9* 8.1* 8.1*  MG 1.7  --   --   --   --      Liver Function Tests: No results for input(s): AST, ALT, ALKPHOS, BILITOT, PROT, ALBUMIN in the last 168 hours. No results for input(s): LIPASE, AMYLASE in the last 168 hours. No results for input(s): AMMONIA in the last 168 hours.  CBC: Recent Labs  Lab 06/10/21 0700 06/11/21 0206 06/12/21 0806 06/13/21 0532 06/15/21 0556  WBC 13.8* 18.3* 13.6* 11.1* 7.9  NEUTROABS 12.4*  --  11.3* 9.1* 5.7  HGB 10.0* 8.4* 8.8* 8.4* 8.6*  HCT 31.3* 26.2* 27.1* 25.8* 26.4*  MCV 92.3 90.3 89.7 89.9 88.9  PLT 142* 109* 114* 107* 121*     Cardiac Enzymes: No results for input(s): CKTOTAL, CKMB, CKMBINDEX, TROPONINI in the last 168 hours.  BNP: Invalid input(s): POCBNP  CBG: No results for input(s): GLUCAP in the last 168 hours.  Microbiology: Results for orders placed or performed during the hospital encounter of 06/10/21  Resp Panel by RT-PCR (Flu A&B, Covid) Nasopharyngeal Swab     Status: None   Collection Time: 06/10/21  7:00 AM   Specimen: Nasopharyngeal Swab; Nasopharyngeal(NP) swabs in  vial transport medium  Result Value Ref Range Status   SARS Coronavirus 2 by RT PCR NEGATIVE NEGATIVE Final    Comment: (NOTE) SARS-CoV-2 target nucleic acids are NOT DETECTED.  The SARS-CoV-2 RNA is generally detectable in upper respiratory specimens during the acute phase of infection. The lowest concentration of SARS-CoV-2 viral copies this assay can detect is 138 copies/mL. A negative result does not preclude SARS-Cov-2 infection and should not be  used as the sole basis for treatment or other patient management decisions. A negative result may occur with  improper specimen collection/handling, submission of specimen other than nasopharyngeal swab, presence of viral mutation(s) within the areas targeted by this assay, and inadequate number of viral copies(<138 copies/mL). A negative result must be combined with clinical observations, patient history, and epidemiological information. The expected result is Negative.  Fact Sheet for Patients:  EntrepreneurPulse.com.au  Fact Sheet for Healthcare Providers:  IncredibleEmployment.be  This test is no t yet approved or cleared by the [place] FDA and  has been authorized for detection and/or diagnosis of SARS-CoV-2 by FDA under an Emergency Use Authorization (EUA). This EUA will remain  in effect (meaning this test can be used) for the duration of the COVID-19 declaration under Section 564(b)(1) of the Act, 21 U.S.C.section 360bbb-3(b)(1), unless the authorization is terminated  or revoked sooner.       Influenza A by PCR NEGATIVE NEGATIVE Final   Influenza B by PCR NEGATIVE NEGATIVE Final    Comment: (NOTE) The Xpert Xpress SARS-CoV-2/FLU/RSV plus assay is intended as an aid in the diagnosis of influenza from Nasopharyngeal swab specimens and should not be used as a sole basis for treatment. Nasal washings and aspirates are unacceptable for Xpert Xpress SARS-CoV-2/FLU/RSV testing.  Fact Sheet for Patients: EntrepreneurPulse.com.au  Fact Sheet for Healthcare Providers: IncredibleEmployment.be  This test is not yet approved or cleared by the [place] FDA and has been authorized for detection and/or diagnosis of SARS-CoV-2 by FDA under an Emergency Use Authorization (EUA). This EUA will remain in effect (meaning this test can be used) for the duration of the COVID-19 declaration under Section  564(b)(1) of the Act, 21 U.S.C. section 360bbb-3(b)(1), unless the authorization is terminated or revoked.  Performed at [facility], [address] [place], [place] [address]     Coagulation Studies: No results for input(s): LABPROT, INR in the last 72 hours.  Urinalysis: No results for input(s): COLORURINE, LABSPEC, PHURINE, GLUCOSEU, HGBUR, BILIRUBINUR, KETONESUR, PROTEINUR, UROBILINOGEN, NITRITE, LEUKOCYTESUR in the last 72 hours.  Invalid input(s): APPERANCEUR    Imaging: No results found.   Medications:      amLODipine  10 mg Oral Daily   aspirin EC  81 mg Oral Daily   atorvastatin  40 mg Oral q1800   calcitRIOL  0.25 mcg Oral Daily   carvedilol  6.25 mg Oral BID WC   citalopram  20 mg Oral Daily   finasteride  5 mg Oral Daily   hydrALAZINE  100 mg Oral TID   isosorbide mononitrate  60 mg Oral Daily   pantoprazole  40 mg Oral Daily   sodium chloride flush  3 mL Intravenous Q12H   tamsulosin  0.4 mg Oral Daily   tuberculin  5 Units Intradermal Once   umeclidinium bromide  1 puff Inhalation Daily   cyanocobalamin  1,000 mcg Oral Daily   acetaminophen, albuterol, dextromethorphan-guaiFENesin, hydrALAZINE, loratadine, morphine injection, nitroGLYCERIN, ondansetron (ZOFRAN) IV, sodium chloride  Assessment/ Plan:  Mr. MAKOA SATZ is a  81 y.o.  male male with past medical conditions including CAD, hypertension, asthma, hyperlipidemia, systolic heart failure, GERD, AAA repair, and chronic kidney disease stage IV.  Patient presents to the emergency department with complaints of shortness of breath.  Patient has been admitted for Shortness of breath [R06.02] SOB (shortness of breath) [R06.02] Elevated troponin [R77.8] Acute on chronic systolic CHF (congestive heart failure) (HCC) [I50.23] Congestive heart failure, unspecified HF chronicity, unspecified heart failure type ([place]) [I50.9]   Acute Kidney Injury on chronic kidney disease stage IV with  baseline creatinine 3.4 and GFR of 16 on 05/30/21.  Acute kidney injury secondary to what appears to be cardiorenal syndrome Losartan currently held.  No IV contrast exposure.  No acute indication for dialysis.         Renal function continues to decline with increasing creatinine today along with decreasing urine output.  Discussed with patient the possibility of needing dialysis.  Based on steady decline reviewed through outpatient labs, we feel if dialysis is necessary, may require long-term.  We will continue to monitor renal function for 1 to 2 days and determine if dialysis is necessary.  Patient is agreeable if needed to pursue dialysis.  Lab Results  Component Value Date   CREATININE 5.18 (H) 06/15/2021   CREATININE 4.99 (H) 06/14/2021   CREATININE 4.89 (H) 06/13/2021    Intake/Output Summary (Last 24 hours) at 06/15/2021 1214 Last data filed at 06/15/2021 0830 Gross per 24 hour  Intake 340 ml  Output 850 ml  Net -510 ml    2.  Hypertension with chronic kidney disease.  Home regimen includes carvedilol, furosemide, hydralazine, isosorbide, losartan and spironolactone.  Diuretics remain held  3.  Chronic systolic heart failure.Previous echo from prior admission shows EF 40 to 45% with mildly decreased left ventricular function and mild LVH.  Cardiology following and recommend conservative management.  4.Anemia of chronic disease Patient hemoglobin remains low Patient iron saturation when checked on February 15 were within normal limits We will follow closely and then decide further need for Epogen    LOS: 5 Lukis Bunt 3/5/202312:14 PM

## 2021-06-15 NOTE — Progress Notes (Signed)
Assumed care of pt at 1900. Sleepy but easily arousable this shift. At times waking up disoriented, however, easily reoriented. Pt w/ minimal urine output overnight. IVF infused prior to this shift, PO intake encouraged. Pt updated on POC w/ verbalized understanding. Full assessment per flowsheets and medication administration per [facility]. Call bell within reach. Comfort and safety maintained.  ?

## 2021-06-16 DIAGNOSIS — N184 Chronic kidney disease, stage 4 (severe): Secondary | ICD-10-CM | POA: Diagnosis not present

## 2021-06-16 DIAGNOSIS — N179 Acute kidney failure, unspecified: Secondary | ICD-10-CM | POA: Diagnosis not present

## 2021-06-16 DIAGNOSIS — I5023 Acute on chronic systolic (congestive) heart failure: Secondary | ICD-10-CM | POA: Diagnosis not present

## 2021-06-16 LAB — RENAL FUNCTION PANEL
Albumin: 2.5 g/dL — ABNORMAL LOW (ref 3.5–5.0)
Anion gap: 11 (ref 5–15)
BUN: 84 mg/dL — ABNORMAL HIGH (ref 8–23)
CO2: 21 mmol/L — ABNORMAL LOW (ref 22–32)
Calcium: 8.1 mg/dL — ABNORMAL LOW (ref 8.9–10.3)
Chloride: 106 mmol/L (ref 98–111)
Creatinine, Ser: 5.35 mg/dL — ABNORMAL HIGH (ref 0.61–1.24)
GFR, Estimated: 10 mL/min — ABNORMAL LOW (ref >60)
Glucose, Bld: 114 mg/dL — ABNORMAL HIGH (ref 70–99)
Phosphorus: 4.5 mg/dL (ref 2.5–4.6)
Potassium: 4.1 mmol/L (ref 3.5–5.1)
Sodium: 138 mmol/L (ref 135–145)

## 2021-06-16 LAB — HEPATITIS B SURFACE ANTIBODY, QUANTITATIVE: Hep B S AB Quant (Post): <3.1 m[IU]/mL — ABNORMAL LOW (ref >9.9)

## 2021-06-16 NOTE — Progress Notes (Signed)
[facility] Cardiology ? ?The patient is an 81 year old male with a past medical history significant for HFrEF ( 05/29/21 LVEF 40-45%), ischemic cardiomyopathy s/p CRT-D 09/2017, CAD s/p PCI x3 07/2016, history of thoracic aortic aneurysm s/p fenestrated repair 2015, hypertension, hypercholesterolemia, CKD 4, COPD with 3 hospital admissions for symptomatic anemia and acute on chronic CHF since 03/2021 who presented to [facility] ED 05/29/2021 with shortness of breath.  Cardiology is consulted for further assistance and management.  ? ?Interval History:  ?-laying flat and sleeping comfortably this morning. ?-denies chest pain and states his breathing is the same.  ? ? ?Vitals:  ? 06/15/21 1954 06/15/21 2335 06/16/21 4196 06/16/21 0758  ?BP: (!) 121/55 (!) 148/46 (!) 150/76 (!) 149/58  ?Pulse: 61 79 62 66  ?Resp: '20 20 18 18  '$ ?Temp: (!) 97.5 ?F (36.4 ?C) 97.6 ?F (36.4 ?C) 97.7 ?F (36.5 ?C) 97.8 ?F (36.6 ?C)  ?TempSrc:      ?SpO2: 94% 93% 95% 99%  ?Weight:   94 kg   ?Height:      ? ? ? ?Intake/Output Summary (Last 24 hours) at 06/16/2021 0850 ?Last data filed at 06/16/2021 2229 ?Gross per 24 hour  ?Intake 336.73 ml  ?Output 350 ml  ?Net -13.27 ml  ? ? ? ? ? ?PHYSICAL EXAM ?General: Elderly appearing Caucasian male, well nourished, in no acute distress. Laying flat in PCU bed, sleeping.  ?HEENT:  Normocephalic and atraumatic. ?Neck:   No JVD.  ?Lungs: Normal respiratory effort on 4L by [place]. Clear bilaterally to auscultation. No wheezes, crackles, rhonchi.  ?Heart: Regular rate with frequent extra beats. Normal S1 and S2 without gallops or murmurs. Radial & DP pulses 2+ bilaterally. ?Abdomen: Obese appearing.  ?Msk: Normal strength and tone for age. ?Extremities: Warm and well perfused. No clubbing, cyanosis.  No peripheral edema ?Neuro: Alert and oriented X 3. ?Psych:  Answers questions appropriately.  ? ? ?LABS: ?Basic Metabolic Panel: ?Recent Labs  ?  06/15/21 ?7989 06/16/21 ?0414  ?NA 137 138  ?K 3.9 4.1  ?CL 105 106  ?CO2 20* 21*  ?GLUCOSE  118* 114*  ?BUN 84* 84*  ?CREATININE 5.18* 5.35*  ?CALCIUM 8.1* 8.1*  ?PHOS  --  4.5  ? ? ?Liver Function Tests: ?Recent Labs  ?  06/16/21 ?0414  ?ALBUMIN 2.5*  ? ?No results for input(s): LIPASE, AMYLASE in the last 72 hours. ?CBC: ?Recent Labs  ?  06/15/21 ?2119  ?WBC 7.9  ?NEUTROABS 5.7  ?HGB 8.6*  ?HCT 26.4*  ?MCV 88.9  ?PLT 121*  ? ? ?Cardiac Enzymes: ?No results for input(s): CKTOTAL, CKMB, CKMBINDEX, TROPONINI in the last 72 hours. ?BNP: ?Invalid input(s): POCBNP ?D-Dimer: ?No results for input(s): DDIMER in the last 72 hours. ?Hemoglobin A1C: ?No results for input(s): HGBA1C in the last 72 hours. ?Fasting Lipid Panel: ?No results for input(s): CHOL, HDL, LDLCALC, TRIG, CHOLHDL, LDLDIRECT in the last 72 hours. ?Thyroid Function Tests: ?No results for input(s): TSH, T4TOTAL, T3FREE, THYROIDAB in the last 72 hours. ? ?Invalid input(s): FREET3 ?Anemia Panel: ?No results for input(s): VITAMINB12, FOLATE, FERRITIN, TIBC, IRON, RETICCTPCT in the last 72 hours. ? ?No results found. ? ? ?Echo LVEF 40 to 45% 05/29/2021 ? ?TELEMETRY: Predominantly A sense V pace: ? ?ASSESSMENT AND PLAN: ? ?Principal Problem: ?  Acute on chronic systolic CHF (congestive heart failure) ([place]) ?Active Problems: ?  CAD (coronary artery disease) ?  HTN (hypertension) ?  Hyperlipidemia ?  BPH (benign prostatic hyperplasia) ?  NSTEMI (non-ST elevated myocardial infarction) ([place]) ?  AKI (acute kidney injury) ([place]) ?  Normocytic anemia ?  HLD (hyperlipidemia) ?  CKD (chronic kidney disease), stage IV ([place]) ?  Chronic respiratory failure with hypoxia (HCC) ?  ? ?1. Acute on chronic systolic congestive heart failure, HFrEF, LVEF 40 to 45%, status post CRT-D 05/2017, appears euvolemic ?2.  CAD, status post PCI x3 07/2016, currently chest pain-free ?3.  Elevated troponin, peak 3324, downtrending, in the absence of chest pain, in the setting of acute on chronic systolic congestive heart failure and acute kidney injury, currently not a candidate for  cardiac catheterization ?4.  AKI / CKD stage IV, worsening renal function, creatinine 84 and on admission 3.78 and now 5.3, respectively, seen by nephrology, felt to be due to cardiorenal syndrome, no acute indication for dialysis at this time ?5.  COPD, chronically on 2 L at home ?6.  Anemia / GI bleed, with black tarry stool, EGD 05/30/2021 revealed gastritis, plavix on hold ?  ?Recommendations ?  ?1.  Agree with current therapy ?2.  Continue carvedilol ?3.  Hold ACE/ARB ?4.  Defer further dose anticoagulation ?5.  Continue isosorbide mononitrate ?6.  Hold diuretics ?7.  Continue to closely monitor renal status, appreciate nephrology input ?8.  Defer further cardiac diagnostics, including cardiac catheterization, in the absence of chest pain, in the setting of acute kidney injury, and recent history of anemia with GI bleed ? ?This patient's plan of care was discussed and created with Dr. Donnelly Angelica and he is in agreement.   ? ?Tristan Schroeder, PA-C ?06/16/2021 ?8:50 AM ? ? ? ?  ?

## 2021-06-16 NOTE — Progress Notes (Signed)
[facility]  ROUNDING NOTE   Subjective:   Peter [name] is a 81 year old male with past medical conditions including CAD, hypertension, asthma, hyperlipidemia, systolic heart failure, GERD, AAA repair, and chronic kidney disease stage IV.  Patient presents to the emergency department with complaints of shortness of breath.  Patient has been admitted for Shortness of breath [R06.02] SOB (shortness of breath) [R06.02] Elevated troponin [R77.8] Acute on chronic systolic CHF (congestive heart failure) (HCC) [I50.23] Congestive heart failure, unspecified HF chronicity, unspecified heart failure type [facility]) [I50.9]  Patient is known to our practice and is followed by Dr. Juleen China.  Patient recently had admission for the same complaint.  We have been consulted to monitor acute kidney injury.  Patient seen sitting at side of bed, alert and oriented Continues to complain of poor appetite Denies shortness of breath, remains on 2 L [place] Continues to make urine, denies burning or discomfort  Creatinine increased 5.35 Urine output 500 mL recorded in 24 hours.   Objective:  Vital signs in last 24 hours:  Temp:  [97.5 F (36.4 C)-97.8 F (36.6 C)] 97.6 F (36.4 C) (03/06 1148) Pulse Rate:  [57-79] 57 (03/06 1148) Resp:  [18-20] 18 (03/06 1148) BP: (121-150)/(39-76) 125/50 (03/06 1148) SpO2:  [93 %-99 %] 97 % (03/06 1148) Weight:  [94 kg] 94 kg (03/06 0336)  Weight change: 0.499 kg Filed Weights   06/14/21 0418 06/15/21 0526 06/16/21 0336  Weight: 93.6 kg 93.5 kg 94 kg    Intake/Output: I/O last 3 completed shifts: In: 676.7 [P.O.:540; I.V.:136.7] Out: 800 [Urine:800]   Intake/Output this shift:  Total I/O In: 0  Out: 175 [Urine:175]  Physical Exam: General: NAD, resting quietly  Head: Normocephalic, atraumatic. Moist oral mucosal membranes  Eyes: Anicteric  Lungs:  Clear to auscultation, normal effort, 2L   Heart: Regular rate and rhythm  Abdomen:  Soft,  nontender, nondistended  Extremities: Trace peripheral edema.  Neurologic: Nonfocal, moving all four extremities  Skin: No lesions       Basic Metabolic Panel: Recent Labs  Lab 06/11/21 0206 06/12/21 0806 06/13/21 0532 06/14/21 0540 06/15/21 0556 06/16/21 0414  NA 140 135 136 138 137 138  K 3.9 3.9 4.0 4.0 3.9 4.1  CL 109 106 105 106 105 106  CO2 22 21* 22 22 20* 21*  GLUCOSE 100* 106* 105* 105* 118* 114*  [name] 62* 73* 78* 85* 84* 84*  CREATININE 3.87* 4.54* 4.89* 4.99* 5.18* 5.35*  CALCIUM 8.0* 7.9* 7.9* 8.1* 8.1* 8.1*  MG 1.7  --   --   --   --   --   PHOS  --   --   --   --   --  4.5     Liver Function Tests: Recent Labs  Lab 06/16/21 0414  ALBUMIN 2.5*   No results for input(s): LIPASE, AMYLASE in the last 168 hours. No results for input(s): AMMONIA in the last 168 hours.  CBC: Recent Labs  Lab 06/10/21 0700 06/11/21 0206 06/12/21 0806 06/13/21 0532 06/15/21 0556  WBC 13.8* 18.3* 13.6* 11.1* 7.9  NEUTROABS 12.4*  --  11.3* 9.1* 5.7  HGB 10.0* 8.4* 8.8* 8.4* 8.6*  HCT 31.3* 26.2* 27.1* 25.8* 26.4*  MCV 92.3 90.3 89.7 89.9 88.9  PLT 142* 109* 114* 107* 121*     Cardiac Enzymes: No results for input(s): CKTOTAL, CKMB, CKMBINDEX, TROPONINI in the last 168 hours.  BNP: Invalid input(s): POCBNP  CBG: No results for input(s): GLUCAP in the last 168 hours.  Microbiology: Results for orders placed or performed during the hospital encounter of 06/10/21  Resp Panel by RT-PCR (Flu A&B, Covid) Nasopharyngeal Swab     Status: None   Collection Time: 06/10/21  7:00 AM   Specimen: Nasopharyngeal Swab; Nasopharyngeal(NP) swabs in vial transport medium  Result Value Ref Range Status   SARS Coronavirus 2 by RT PCR NEGATIVE NEGATIVE Final    Comment: (NOTE) SARS-CoV-2 target nucleic acids are NOT DETECTED.  The SARS-CoV-2 RNA is generally detectable in upper respiratory specimens during the acute phase of infection. The lowest concentration of SARS-CoV-2  viral copies this assay can detect is 138 copies/mL. A negative result does not preclude SARS-Cov-2 infection and should not be used as the sole basis for treatment or other patient management decisions. A negative result may occur with  improper specimen collection/handling, submission of specimen other than nasopharyngeal swab, presence of viral mutation(s) within the areas targeted by this assay, and inadequate number of viral copies(<138 copies/mL). A negative result must be combined with clinical observations, patient history, and epidemiological information. The expected result is Negative.  Fact Sheet for Patients:  EntrepreneurPulse.com.au  Fact Sheet for Healthcare Providers:  IncredibleEmployment.be  This test is no t yet approved or cleared by the [place] FDA and  has been authorized for detection and/or diagnosis of SARS-CoV-2 by FDA under an Emergency Use Authorization (EUA). This EUA will remain  in effect (meaning this test can be used) for the duration of the COVID-19 declaration under Section 564(b)(1) of the Act, 21 U.S.C.section 360bbb-3(b)(1), unless the authorization is terminated  or revoked sooner.       Influenza A by PCR NEGATIVE NEGATIVE Final   Influenza B by PCR NEGATIVE NEGATIVE Final    Comment: (NOTE) The Xpert Xpress SARS-CoV-2/FLU/RSV plus assay is intended as an aid in the diagnosis of influenza from Nasopharyngeal swab specimens and should not be used as a sole basis for treatment. Nasal washings and aspirates are unacceptable for Xpert Xpress SARS-CoV-2/FLU/RSV testing.  Fact Sheet for Patients: EntrepreneurPulse.com.au  Fact Sheet for Healthcare Providers: IncredibleEmployment.be  This test is not yet approved or cleared by the [place] FDA and has been authorized for detection and/or diagnosis of SARS-CoV-2 by FDA under an Emergency Use Authorization  (EUA). This EUA will remain in effect (meaning this test can be used) for the duration of the COVID-19 declaration under Section 564(b)(1) of the Act, 21 U.S.C. section 360bbb-3(b)(1), unless the authorization is terminated or revoked.  Performed at [facility], [address] [place], [place] [address]     Coagulation Studies: No results for input(s): LABPROT, INR in the last 72 hours.  Urinalysis: No results for input(s): COLORURINE, LABSPEC, PHURINE, GLUCOSEU, HGBUR, BILIRUBINUR, KETONESUR, PROTEINUR, UROBILINOGEN, NITRITE, LEUKOCYTESUR in the last 72 hours.  Invalid input(s): APPERANCEUR    Imaging: No results found.   Medications:      amLODipine  10 mg Oral Daily   aspirin EC  81 mg Oral Daily   atorvastatin  40 mg Oral q1800   calcitRIOL  0.25 mcg Oral Daily   carvedilol  6.25 mg Oral BID WC   citalopram  20 mg Oral Daily   finasteride  5 mg Oral Daily   hydrALAZINE  100 mg Oral TID   isosorbide mononitrate  60 mg Oral Daily   pantoprazole  40 mg Oral Daily   sodium chloride flush  3 mL Intravenous Q12H   tamsulosin  0.4 mg Oral Daily   umeclidinium bromide  1 puff Inhalation Daily   cyanocobalamin  1,000 mcg Oral Daily   acetaminophen, albuterol, dextromethorphan-guaiFENesin, hydrALAZINE, loratadine, nitroGLYCERIN, ondansetron (ZOFRAN) IV, sodium chloride  Assessment/ Plan:  [name] is a 81 y.o.  male male with past medical conditions including CAD, hypertension, asthma, hyperlipidemia, systolic heart failure, GERD, AAA repair, and chronic kidney disease stage IV.  Patient presents to the emergency department with complaints of shortness of breath.  Patient has been admitted for Shortness of breath [R06.02] SOB (shortness of breath) [R06.02] Elevated troponin [R77.8] Acute on chronic systolic CHF (congestive heart failure) (HCC) [I50.23] Congestive heart failure, unspecified HF chronicity, unspecified heart failure type ([place])  [I50.9]   Acute Kidney Injury on chronic kidney disease stage IV with baseline creatinine 3.4 and GFR of 16 on 05/30/21.  Acute kidney injury secondary to what appears to be cardiorenal syndrome Losartan currently held.  No IV contrast exposure.  No acute indication for dialysis.        Creatinine continues to rise with decreased urine output recorded.  Poor appetite is concerning also.  Discussed the possibility of needing to initiate renal replacement therapy with the patient.  Unsure at this time whether this will be temporary or require long-term.  There is a chance for recovery however this may be minimal with extended decline in renal function and recent outpatient visits.  We will continue to monitor closely and make a decision in 1 to 2 days.  No acute need for dialysis today.  Patient encouraged to increase oral intake.  Lab Results  Component Value Date   CREATININE 5.35 (H) 06/16/2021   CREATININE 5.18 (H) 06/15/2021   CREATININE 4.99 (H) 06/14/2021    Intake/Output Summary (Last 24 hours) at 06/16/2021 1511 Last data filed at 06/16/2021 1351 Gross per 24 hour  Intake 336.73 ml  Output 525 ml  Net -188.27 ml    2.  Hypertension with chronic kidney disease.  Home regimen includes carvedilol, furosemide, hydralazine, isosorbide, losartan and spironolactone.  Diuretics held  3.  Chronic systolic heart failure.Previous echo from prior admission shows EF 40 to 45% with mildly decreased left ventricular function and mild LVH.  Cardiology following and recommend conservative management.  4.Anemia of chronic disease Patient hemoglobin remains low Patient iron saturation when checked on February 15 were within normal limits We will follow closely and then decide further need for Epogen    LOS: 6 Peter [name] 3/6/20233:11 PM

## 2021-06-16 NOTE — Progress Notes (Signed)
Physical Therapy Treatment ?Patient Details ?Name: Peter [name] ?MRN: [id number] ?DOB: 13-Jan-1941 ?Today's Date: 06/16/2021 ? ? ?History of Present Illness Per MD note, DOMENIK TRICE is a 81 y.o. 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 Patient was recently hospitalized from 2/15 - 2/22 due to chest pain and non-STEMI. ? ?  ?PT Comments  ? ? Pt received sleeping in bed. Once awake agreeable to PT services. Pt resting on 2L/min [place]. Remains mod-I for bed mobility and STS transfers. Focus of session on dynamic balance and ambulation without need of AD. Pt ambulated total of ~180' without AD. First 100' pt performing step through pattern with supervision. Last 22' pt becomes fatigued with staggering and veering R >L with stepping strategy requiring x2 PT minA to correct. Pt returning to seated EoB performing LE therex then returning to bed with all needs in reach. SPO2 remained >/= 95% throughout mobility, minor SOB noted post ambulation. Pt educated on need to practice ambulation without AD with PT but when ambulating with other staff, RW is important to prevent falls.  D/c recs remain appropriate.  ?  ?Recommendations for follow up therapy are one component of a multi-disciplinary discharge planning process, led by the attending physician.  Recommendations may be updated based on patient status, additional functional criteria and insurance authorization. ? ?Follow Up Recommendations ? Home health PT ?  ?  ?Assistance Recommended at Discharge PRN  ?Patient can return home with the following A little help with walking and/or transfers ?  ?Equipment Recommendations ? None recommended by PT  ?  ?Recommendations for Other Services   ? ? ?  ?Precautions /  Restrictions Precautions ?Precautions: Fall ?Restrictions ?Weight Bearing Restrictions: No  ?  ? ?Mobility ? Bed Mobility ?Overal bed mobility: Modified Independent ?  ?  ?  ?  ?  ?  ?  ?Patient Response: Cooperative ? ?Transfers ?Overall transfer level: Modified independent ?Equipment used: Rolling walker (2 wheels) ?Transfers: Sit to/from Stand ?Sit to Stand: Modified independent (Device/Increase time) ?  ?  ?  ?  ?  ?  ?  ? ?Ambulation/Gait ?Ambulation/Gait assistance: Min guard ?Gait Distance (Feet): 180 Feet ?Assistive device: Rolling walker (2 wheels) ?Gait Pattern/deviations: Step-through pattern, Drifts right/left ?Gait velocity: 10' walk time in 5-6 seconds. ?Gait velocity interpretation: <1.8 ft/sec, indicate of risk for recurrent falls ?  ?General Gait Details: Remained on 2L/min [place] throughout mobility with sats >/= 95% throughout. Initially ambulating in hallway with no AD with good stability. Towards end of bout pt fatigues and staggers R/L requiring PT minA to correct. ? ? ?Stairs ?  ?  ?  ?  ?  ? ? ?Wheelchair Mobility ?  ? ?Modified Rankin (Stroke Patients Only) ?  ? ? ?  ?Balance Overall balance assessment: Needs assistance ?Sitting-balance support: No upper extremity supported, Feet supported ?Sitting balance-Leahy Scale: Good ?  ?  ?Standing balance support: Bilateral upper extremity supported ?Standing balance-Leahy Scale: Good ?  ?  ?  ?  ?  ?  ?  ?  ?  ?  ?  ?  ?  ? ?  ?Cognition Arousal/Alertness: Awake/alert ?Behavior During Therapy: [facility] for tasks assessed/performed ?Overall Cognitive Status: Within Functional Limits for tasks assessed ?  ?  ?  ?  ?  ?  ?  ?  ?  ?  ?  ?  ?  ?  ?  ?  ?  General Comments: Sleeping upon arrival but easily awakens to voice and light touch. ?  ?  ? ?  ?Exercises General Exercises - Lower Extremity ?Long Arc Quad: AROM, Strengthening, Both, 10 reps, Seated ?Hip Flexion/Marching: AROM, Strengthening, Both, Seated, 10 reps ?Other Exercises ?Other Exercises: x5 STS ? ?   ?General Comments General comments (skin integrity, edema, etc.): max HR 66-71 bPM post ambulation. SPO2 >/=95% on 2 L/min  ?  ?  ? ?Pertinent Vitals/Pain Pain Assessment ?Pain Assessment: No/denies pain  ? ? ?Home Living   ?  ?  ?  ?  ?  ?  ?  ?  ?  ?   ?  ?Prior Function    ?  ?  ?   ? ?PT Goals (current goals can now be found in the care plan section) Acute Rehab PT Goals ?Patient Stated Goal: to improve breathing to prevent hospitalizations ?PT Goal Formulation: With patient ?Time For Goal Achievement: 06/25/21 ?Potential to Achieve Goals: Good ?Progress towards PT goals: Progressing toward goals ? ?  ?Frequency ? ? ? Min 2X/week ? ? ? ?  ?PT Plan Current plan remains appropriate  ? ? ?Co-evaluation   ?  ?  ?  ?  ? ?  ?AM-PAC PT "6 Clicks" Mobility   ?Outcome Measure ? Help needed turning from your back to your side while in a flat bed without using bedrails?: None ?Help needed moving from lying on your back to sitting on the side of a flat bed without using bedrails?: None ?Help needed moving to and from a bed to a chair (including a wheelchair)?: None ?Help needed standing up from a chair using your arms (e.g., wheelchair or bedside chair)?: A Little ?Help needed to walk in hospital room?: A Little ?Help needed climbing 3-5 steps with a railing? : A Lot ?6 Click Score: 20 ? ?  ?End of Session Equipment Utilized During Treatment: Oxygen;Gait belt ?Activity Tolerance: Patient tolerated treatment well ?Patient left: in bed;with bed alarm set;with call bell/phone within reach ?Nurse Communication: Mobility status ?PT Visit Diagnosis: Muscle weakness (generalized) (M62.81);Difficulty in walking, not elsewhere classified (R26.2) ?  ? ? ?Time: 3744-5146 ?PT Time Calculation (min) (ACUTE ONLY): 20 min ? ?Charges:  $Gait Training: 8-22 mins          ?          ? ?Salem Caster. Fairly IV, PT, DPT ?Physical Therapist- [place]  ?[facility]  ?06/16/2021, 2:43 PM ? ?

## 2021-06-16 NOTE — Care Management Important Message (Signed)
Important Message  Patient Details  Name: Peter [name] MRN: [id number] Date of Birth: 06-Apr-1941   Medicare Important Message Given:  Yes     [name] 06/16/2021, 12:03 PM

## 2021-06-16 NOTE — Progress Notes (Signed)
Progress Note    Peter [name]  FTD:[id number] DOB: 10/19/1940  DOA: 06/10/2021 PCP: Tracie Harrier, MD      Brief Narrative:    Medical records reviewed and are as summarized below:  Peter [name] is a 81 y.o. male with medical history significant of sCHF with EF 40-45%, CAD with multiple stent placement, hypertension, hyperlipidemia, asthma, as needed oxygen at home, GERD, depression, AAA repair, aortic arch aneurysm, former smoker (recently quit smoking), CKD-4, AICD placement. Patient was recently hospitalized from 2/15 - 2/22 due to chest pain and non-STEMI.  He also had black stool bowel movement, GI was consulted, EGD was done, which showed gastritis.  Patient's Plavix is on hold.    He presented to the hospital because of shortness of breath and cough.   He was admitted to the hospital for acute on chronic systolic CHF. Troponins were also significantly elevated but this was attributed to demand ischemia.  He did not complain of any chest pain.    Assessment/Plan:   Principal Problem:   Acute on chronic systolic CHF (congestive heart failure) (HCC) Active Problems:   CAD (coronary artery disease)   HTN (hypertension)   Hyperlipidemia   BPH (benign prostatic hyperplasia)   NSTEMI (non-ST elevated myocardial infarction) (HCC)   AKI (acute kidney injury) (HCC)   Normocytic anemia   HLD (hyperlipidemia)   CKD (chronic kidney disease), stage IV (HCC)   Chronic respiratory failure with hypoxia (HCC)   Body mass index is 28.91 kg/m.  Acute on chronic systolic CHF: Compensated.  Continue to hold Lasix.  CAD with recent NSTEMI on 05/28/2021, elevated troponins:   Latest Reference Range & Units 06/10/21 09:50 06/10/21 12:30 06/10/21 19:14 06/11/21 02:06 06/11/21 06:06 06/11/21 10:03 06/11/21 11:24  Troponin I (High Sensitivity) <18 ng/L 525 (HH) 858 (HH) 2,587 (HH) 3,025 (HH) 3,184 (HH) 3,038 (HH) 3,324 (HH)  (HH):  Elevated troponin suspected to be due to  demand ischemia per cardiologist.  Worsening AKI on CKD stage IV: Continue low rate IV fluids because of poor oral intake.  Nephrologist is contemplating initiating hemodialysis.  Follow-up with nephrologist for further recommendations.    Chronic hypoxic respiratory failure: Continue 2 L/min oxygen via nasal cannula  Leukocytosis: Improved.  This is likely reactive.   Other comorbidities include hypertension, hyperlipidemia, BPH, chronic normocytic anemia, asthma, chronic thrombocytopenia  Tuberculin skin test done on 06/13/2021 was negative.   Diet Order             Diet 2 gram sodium Room service appropriate? Yes; Fluid consistency: Thin  Diet effective now                   Consultants: Cardiologist Nephrologist  Procedures: None    Medications:    amLODipine  10 mg Oral Daily   aspirin EC  81 mg Oral Daily   atorvastatin  40 mg Oral q1800   calcitRIOL  0.25 mcg Oral Daily   carvedilol  6.25 mg Oral BID WC   citalopram  20 mg Oral Daily   finasteride  5 mg Oral Daily   hydrALAZINE  100 mg Oral TID   isosorbide mononitrate  60 mg Oral Daily   pantoprazole  40 mg Oral Daily   sodium chloride flush  3 mL Intravenous Q12H   tamsulosin  0.4 mg Oral Daily   umeclidinium bromide  1 puff Inhalation Daily   cyanocobalamin  1,000 mcg Oral Daily   Continuous Infusions:  lactated ringers 50  mL/hr at 06/15/21 1436      Anti-infectives (From admission, onward)    None              Family Communication/Anticipated D/C date and plan/Code Status   DVT prophylaxis: SCDs Start: 06/10/21 0920     Code Status: Full Code  Family Communication: None Disposition Plan: Plan to discharge to ALF in 3 to 4 days   Status is: Inpatient Remains inpatient appropriate because: Shortness of breath               Subjective:   C/o shortness of breath with exertion.  He is not eating or drinking well.  No cough or chest pain.  Objective:     Vitals:   06/15/21 2335 06/16/21 0336 06/16/21 0758 06/16/21 1148  BP: (!) 148/46 (!) 150/76 (!) 149/58 (!) 125/50  Pulse: 79 62 66 (!) 57  Resp: '20 18 18 18  '$ Temp: 97.6 F (36.4 C) 97.7 F (36.5 C) 97.8 F (36.6 C) 97.6 F (36.4 C)  TempSrc:      SpO2: 93% 95% 99% 97%  Weight:  94 kg    Height:       No data found.   Intake/Output Summary (Last 24 hours) at 06/16/2021 1214 Last data filed at 06/16/2021 0900 Gross per 24 hour  Intake 336.73 ml  Output 350 ml  Net -13.27 ml   Filed Weights   06/14/21 0418 06/15/21 0526 06/16/21 0336  Weight: 93.6 kg 93.5 kg 94 kg    Exam:  GEN: NAD SKIN: No rash EYES: EOMI ENT: MMM CV: RRR PULM: CTA B ABD: soft, ND, NT, +BS CNS: AAO x 3, non focal EXT: No edema or tenderness         Data Reviewed:   I have personally reviewed following labs and imaging studies:  Labs: Labs show the following:   Basic Metabolic Panel: Recent Labs  Lab 06/11/21 0206 06/12/21 0806 06/13/21 0532 06/14/21 0540 06/15/21 0556 06/16/21 0414  NA 140 135 136 138 137 138  K 3.9 3.9 4.0 4.0 3.9 4.1  CL 109 106 105 106 105 106  CO2 22 21* 22 22 20* 21*  GLUCOSE 100* 106* 105* 105* 118* 114*  BUN 62* 73* 78* 85* 84* 84*  CREATININE 3.87* 4.54* 4.89* 4.99* 5.18* 5.35*  CALCIUM 8.0* 7.9* 7.9* 8.1* 8.1* 8.1*  MG 1.7  --   --   --   --   --   PHOS  --   --   --   --   --  4.5   GFR Estimated Creatinine Clearance: 12.7 mL/min (A) (by C-G formula based on SCr of 5.35 mg/dL (H)). Liver Function Tests: Recent Labs  Lab 06/16/21 0414  ALBUMIN 2.5*   No results for input(s): LIPASE, AMYLASE in the last 168 hours. No results for input(s): AMMONIA in the last 168 hours. Coagulation profile No results for input(s): INR, PROTIME in the last 168 hours.  CBC: Recent Labs  Lab 06/10/21 0700 06/11/21 0206 06/12/21 0806 06/13/21 0532 06/15/21 0556  WBC 13.8* 18.3* 13.6* 11.1* 7.9  NEUTROABS 12.4*  --  11.3* 9.1* 5.7  HGB 10.0* 8.4*  8.8* 8.4* 8.6*  HCT 31.3* 26.2* 27.1* 25.8* 26.4*  MCV 92.3 90.3 89.7 89.9 88.9  PLT 142* 109* 114* 107* 121*   Cardiac Enzymes: No results for input(s): CKTOTAL, CKMB, CKMBINDEX, TROPONINI in the last 168 hours. BNP (last 3 results) No results for input(s): PROBNP in the last 8760 hours.  CBG: No results for input(s): GLUCAP in the last 168 hours. D-Dimer: No results for input(s): DDIMER in the last 72 hours. Hgb A1c: No results for input(s): HGBA1C in the last 72 hours.  Lipid Profile: No results for input(s): CHOL, HDL, LDLCALC, TRIG, CHOLHDL, LDLDIRECT in the last 72 hours. Thyroid function studies: No results for input(s): TSH, T4TOTAL, T3FREE, THYROIDAB in the last 72 hours.  Invalid input(s): FREET3 Anemia work up: No results for input(s): VITAMINB12, FOLATE, FERRITIN, TIBC, IRON, RETICCTPCT in the last 72 hours. Sepsis Labs: Recent Labs  Lab 06/10/21 0700 06/11/21 0206 06/12/21 0806 06/13/21 0532 06/15/21 0556  PROCALCITON <0.10  --   --   --   --   WBC 13.8* 18.3* 13.6* 11.1* 7.9    Microbiology Recent Results (from the past 240 hour(s))  Resp Panel by RT-PCR (Flu A&B, Covid) Nasopharyngeal Swab     Status: None   Collection Time: 06/10/21  7:00 AM   Specimen: Nasopharyngeal Swab; Nasopharyngeal(NP) swabs in vial transport medium  Result Value Ref Range Status   SARS Coronavirus 2 by RT PCR NEGATIVE NEGATIVE Final    Comment: (NOTE) SARS-CoV-2 target nucleic acids are NOT DETECTED.  The SARS-CoV-2 RNA is generally detectable in upper respiratory specimens during the acute phase of infection. The lowest concentration of SARS-CoV-2 viral copies this assay can detect is 138 copies/mL. A negative result does not preclude SARS-Cov-2 infection and should not be used as the sole basis for treatment or other patient management decisions. A negative result may occur with  improper specimen collection/handling, submission of specimen other than nasopharyngeal swab,  presence of viral mutation(s) within the areas targeted by this assay, and inadequate number of viral copies(<138 copies/mL). A negative result must be combined with clinical observations, patient history, and epidemiological information. The expected result is Negative.  Fact Sheet for Patients:  EntrepreneurPulse.com.au  Fact Sheet for Healthcare Providers:  IncredibleEmployment.be  This test is no t yet approved or cleared by the [place] FDA and  has been authorized for detection and/or diagnosis of SARS-CoV-2 by FDA under an Emergency Use Authorization (EUA). This EUA will remain  in effect (meaning this test can be used) for the duration of the COVID-19 declaration under Section 564(b)(1) of the Act, 21 U.S.C.section 360bbb-3(b)(1), unless the authorization is terminated  or revoked sooner.       Influenza A by PCR NEGATIVE NEGATIVE Final   Influenza B by PCR NEGATIVE NEGATIVE Final    Comment: (NOTE) The Xpert Xpress SARS-CoV-2/FLU/RSV plus assay is intended as an aid in the diagnosis of influenza from Nasopharyngeal swab specimens and should not be used as a sole basis for treatment. Nasal washings and aspirates are unacceptable for Xpert Xpress SARS-CoV-2/FLU/RSV testing.  Fact Sheet for Patients: EntrepreneurPulse.com.au  Fact Sheet for Healthcare Providers: IncredibleEmployment.be  This test is not yet approved or cleared by the [place] FDA and has been authorized for detection and/or diagnosis of SARS-CoV-2 by FDA under an Emergency Use Authorization (EUA). This EUA will remain in effect (meaning this test can be used) for the duration of the COVID-19 declaration under Section 564(b)(1) of the Act, 21 U.S.C. section 360bbb-3(b)(1), unless the authorization is terminated or revoked.  Performed at [facility], [address] [place], [place] [address]     Procedures  and diagnostic studies:  No results found.             LOS: 6 days   [facility]  Pager on [email or web address]. If 7PM-7AM, please contact night-coverage at [email or web address]     06/16/2021, 12:14 PM

## 2021-06-17 ENCOUNTER — Inpatient Hospital Stay
Admission: EM | Disposition: A | Discharge status: Nursing Home (Includes ICF) | Payer: Self-pay | Source: Home / Self Care | Attending: Internal Medicine

## 2021-06-17 ENCOUNTER — Encounter
Admission: EM | Disposition: A | Discharge status: Nursing Home (Includes ICF) | Payer: Self-pay | Source: Home / Self Care | Attending: Internal Medicine

## 2021-06-17 DIAGNOSIS — I2510 Atherosclerotic heart disease of native coronary artery without angina pectoris: Secondary | ICD-10-CM

## 2021-06-17 DIAGNOSIS — N179 Acute kidney failure, unspecified: Secondary | ICD-10-CM | POA: Diagnosis not present

## 2021-06-17 DIAGNOSIS — I132 Hypertensive heart and chronic kidney disease with heart failure and with stage 5 chronic kidney disease, or end stage renal disease: Principal | ICD-10-CM

## 2021-06-17 DIAGNOSIS — N289 Disorder of kidney and ureter, unspecified: Secondary | ICD-10-CM

## 2021-06-17 DIAGNOSIS — I5023 Acute on chronic systolic (congestive) heart failure: Secondary | ICD-10-CM | POA: Diagnosis not present

## 2021-06-17 DIAGNOSIS — J449 Chronic obstructive pulmonary disease, unspecified: Secondary | ICD-10-CM

## 2021-06-17 DIAGNOSIS — N189 Chronic kidney disease, unspecified: Secondary | ICD-10-CM

## 2021-06-17 HISTORY — PX: DIALYSIS/PERMA CATHETER INSERTION: CATH118288

## 2021-06-17 LAB — BASIC METABOLIC PANEL
Anion gap: 11 (ref 5–15)
BUN: 88 mg/dL — ABNORMAL HIGH (ref 8–23)
CO2: 19 mmol/L — ABNORMAL LOW (ref 22–32)
Calcium: 8 mg/dL — ABNORMAL LOW (ref 8.9–10.3)
Chloride: 105 mmol/L (ref 98–111)
Creatinine, Ser: 5.76 mg/dL — ABNORMAL HIGH (ref 0.61–1.24)
GFR, Estimated: 9 mL/min — ABNORMAL LOW (ref >60)
Glucose, Bld: 99 mg/dL (ref 70–99)
Potassium: 4 mmol/L (ref 3.5–5.1)
Sodium: 135 mmol/L (ref 135–145)

## 2021-06-17 SURGERY — DIALYSIS/PERMA CATHETER INSERTION
Anesthesia: Moderate Sedation

## 2021-06-17 MED ORDER — MIDAZOLAM HCL 2 MG/2ML IJ SOLN
INTRAMUSCULAR | Status: AC
  Filled 2021-06-17: qty 2

## 2021-06-17 MED ORDER — MIDAZOLAM HCL 2 MG/2ML IJ SOLN
INTRAMUSCULAR | Status: DC | PRN
  Administered 2021-06-17 (×2): 1 mg via INTRAVENOUS

## 2021-06-17 MED ORDER — FENTANYL CITRATE PF 50 MCG/ML IJ SOSY
PREFILLED_SYRINGE | INTRAMUSCULAR | Status: AC
  Filled 2021-06-17: qty 1

## 2021-06-17 MED ORDER — FENTANYL CITRATE (PF) 100 MCG/2ML IJ SOLN
INTRAMUSCULAR | Status: DC | PRN
  Administered 2021-06-17: 50 ug via INTRAVENOUS

## 2021-06-17 MED ORDER — LACTATED RINGERS IV SOLN: INTRAVENOUS | Status: DC

## 2021-06-17 MED ORDER — CEFAZOLIN SODIUM-DEXTROSE 1-4 GM/50ML-% IV SOLN
INTRAVENOUS | Status: AC
  Filled 2021-06-17: qty 50

## 2021-06-17 MED ORDER — CHLORHEXIDINE GLUCONATE CLOTH 2 % EX PADS
6.0000 | MEDICATED_PAD | Freq: Every day | CUTANEOUS | Status: DC
  Administered 2021-06-18 – 2021-06-24 (×6): 6 via TOPICAL

## 2021-06-17 MED ORDER — CEFAZOLIN SODIUM-DEXTROSE 1-4 GM/50ML-% IV SOLN
1.0000 g | INTRAVENOUS | Status: AC
  Administered 2021-06-17: 1 g via INTRAVENOUS

## 2021-06-17 SURGICAL SUPPLY — 14 items
BIOPATCH RED 1 DISK 7.0 (GAUZE/BANDAGES/DRESSINGS) ×1 IMPLANT
BIOPATCH RED 1IN DISK 7.0MM (GAUZE/BANDAGES/DRESSINGS) ×1
CATH GLIDEPATH RETRO 14.5X19 (CATHETERS) ×2 IMPLANT
COVER PROBE U/S 5X48 (MISCELLANEOUS) ×4 IMPLANT
DERMABOND ADVANCED (GAUZE/BANDAGES/DRESSINGS) ×2
DERMABOND ADVANCED .7 DNX12 (GAUZE/BANDAGES/DRESSINGS) IMPLANT
DRAPE INCISE IOBAN 66X45 STRL (DRAPES) ×2 IMPLANT
GUIDEWIRE AMPLATZ SHORT (WIRE) ×4 IMPLANT
NDL ENTRY 21GA 7CM ECHOTIP (NEEDLE) IMPLANT
NEEDLE ENTRY 21GA 7CM ECHOTIP (NEEDLE) ×3 IMPLANT
PACK ANGIOGRAPHY (CUSTOM PROCEDURE TRAY) ×2 IMPLANT
SET INTRO CAPELLA COAXIAL (SET/KITS/TRAYS/PACK) ×2 IMPLANT
SUT MNCRL AB 4-0 PS2 18 (SUTURE) ×2 IMPLANT
SUT SILK 0 FSL (SUTURE) ×2 IMPLANT

## 2021-06-17 NOTE — Op Note (Signed)
OPERATIVE NOTE ? ? ?PROCEDURE: ?Insertion of tunneled dialysis catheter right IJ approach with ultrasound and fluoroscopic guidance. ? ?PRE-OPERATIVE DIAGNOSIS: Acute on chronic renal insufficiency now requiring hemodialysis ? ?POST-OPERATIVE DIAGNOSIS: Same ? ?SURGEON: Hortencia Pilar. ? ?ANESTHESIA: Conscious sedation was administered under my direct supervision by the interventional radiology RN. IV Versed plus fentanyl were utilized. Continuous ECG, pulse oximetry and blood pressure was monitored throughout the entire procedure. Conscious sedation was for a total of 30 minutes and 2 seconds. ? ?ESTIMATED BLOOD LOSS: Minimal cc ? ?CONTRAST USED:  None ? ?FLUOROSCOPY TIME: 1.2 minutes ? ?INDICATIONS:   ?Peter [name] a 81 y.o. y.o. male who presents with worsening renal function.  He now requires hemodialysis.  Tunneled catheter is being placed.  Risks and benefits of been reviewed all questions answered patient agrees to proceed. ? ?DESCRIPTION: ?After obtaining full informed written consent, the patient was positioned supine. The right neck and chest wall was prepped and draped in a sterile fashion. Ultrasound was placed in a sterile sleeve. Ultrasound was utilized to identify the right internal jugular vein vein which is noted to be echolucent and compressible indicating patency. Image is recorded for the permanent record. Under direct ultrasound visualization a micro-needle is inserted into the vein followed by the micro-wire. Micro-sheath was then advanced and a J wire is inserted without difficulty under fluoroscopic guidance. Small counterincision was made at the wire insertion site. Dilators are passed over the wire and the tunneled dialysis catheter is fed into the central venous system without difficulty. ? ?Under fluoroscopy the catheter tip positioned at the atrial caval junction. The catheter is then approximated to the right chest wall and an exit site selected. 1% lidocaine is infiltrated in  soft tissues at this level small incision is made and the tunneling device is then passed from the exit site to the neck counterincision. Catheter is then connected to the tunneling device and the catheter was pulled subcutaneously. It is then transected and the hub assembly connected without difficulty. Both lumens aspirate and flush easily. After verification of smooth contour with proper tip position under fluoroscopy the catheter is packed with 5000 units of heparin per lumen. ? ?Catheter secured to the skin of the right chest wall with 0 silk. A sterile dressing is applied with a Biopatch. ? ?COMPLICATIONS: None ? ?CONDITION: Good ? ?Hortencia Pilar ?[facility] ?Office:  [phone]  ? ?06/17/2021,3:23 PM ? ?  ?

## 2021-06-17 NOTE — Progress Notes (Signed)
[facility]  ROUNDING NOTE   Subjective:   [name] is a 81 year old male with past medical conditions including CAD, hypertension, asthma, hyperlipidemia, systolic heart failure, GERD, AAA repair, and chronic kidney disease stage IV.  Patient presents to the emergency department with complaints of shortness of breath.  Patient has been admitted for Shortness of breath [R06.02] SOB (shortness of breath) [R06.02] Elevated troponin [R77.8] Acute on chronic systolic CHF (congestive heart failure) (HCC) [I50.23] Congestive heart failure, unspecified HF chronicity, unspecified heart failure type [facility]) [I50.9]  Patient is known to our practice and is followed by Dr. Juleen China.  Patient recently had admission for the same complaint.  We have been consulted to monitor acute kidney injury.  Patient seen sitting up in chair Appears drowsy today Sister, [name] at bedside She reports patient hallucinating overnight, seeing spiders and bugs crawling on ceiling Patient wants to start dialysis so he can "feel better."  Creatinine 5.76 Urine output 640m in 24 hours.   Objective:  Vital signs in last 24 hours:  Temp:  [97.4 F (36.3 C)-98.1 F (36.7 C)] 98 F (36.7 C) (03/07 1413) Pulse Rate:  [48-103] 52 (03/07 1413) Resp:  [12-20] 17 (03/07 1445) BP: (125-153)/(48-64) 153/54 (03/07 1445) SpO2:  [94 %-100 %] 94 % (03/07 1445) Weight:  [90.3 kg] 90.3 kg (03/06 2323)  Weight change: -3.731 kg Filed Weights   06/15/21 0526 06/16/21 0336 06/16/21 2323  Weight: 93.5 kg 94 kg 90.3 kg    Intake/Output: I/O last 3 completed shifts: In: 0  Out: 1025 [Urine:1025]   Intake/Output this shift:  Total I/O In: 240 [P.O.:240] Out: -   Physical Exam: General: NAD, resting quietly, somnolent  Head: Normocephalic, atraumatic. Moist oral mucosal membranes  Eyes: Anicteric  Lungs:  Clear to auscultation, normal effort, 2L [place]  Heart: Regular rate and rhythm  Abdomen:  Soft,  nontender, nondistended  Extremities: Trace peripheral edema.  Neurologic: Somnolent moving all four extremities  Skin: No lesions       Basic Metabolic Panel: Recent Labs  Lab 06/11/21 0206 06/12/21 0806 06/13/21 0532 06/14/21 0540 06/15/21 0556 06/16/21 0414 06/17/21 0616  NA 140   < > 136 138 137 138 135  K 3.9   < > 4.0 4.0 3.9 4.1 4.0  CL 109   < > 105 106 105 106 105  CO2 22   < > 22 22 20* 21* 19*  GLUCOSE 100*   < > 105* 105* 118* 114* 99  BUN 62*   < > 78* 85* 84* 84* 88*  CREATININE 3.87*   < > 4.89* 4.99* 5.18* 5.35* 5.76*  CALCIUM 8.0*   < > 7.9* 8.1* 8.1* 8.1* 8.0*  MG 1.7  --   --   --   --   --   --   PHOS  --   --   --   --   --  4.5  --    < > = values in this interval not displayed.     Liver Function Tests: Recent Labs  Lab 06/16/21 0414  ALBUMIN 2.5*    No results for input(s): LIPASE, AMYLASE in the last 168 hours. No results for input(s): AMMONIA in the last 168 hours.  CBC: Recent Labs  Lab 06/11/21 0206 06/12/21 0806 06/13/21 0532 06/15/21 0556  WBC 18.3* 13.6* 11.1* 7.9  NEUTROABS  --  11.3* 9.1* 5.7  HGB 8.4* 8.8* 8.4* 8.6*  HCT 26.2* 27.1* 25.8* 26.4*  MCV 90.3 89.7 89.9  88.9  PLT 109* 114* 107* 121*     Cardiac Enzymes: No results for input(s): CKTOTAL, CKMB, CKMBINDEX, TROPONINI in the last 168 hours.  BNP: Invalid input(s): POCBNP  CBG: No results for input(s): GLUCAP in the last 168 hours.  Microbiology: Results for orders placed or performed during the hospital encounter of 06/10/21  Resp Panel by RT-PCR (Flu A&B, Covid) Nasopharyngeal Swab     Status: None   Collection Time: 06/10/21  7:00 AM   Specimen: Nasopharyngeal Swab; Nasopharyngeal(NP) swabs in vial transport medium  Result Value Ref Range Status   SARS Coronavirus 2 by RT PCR NEGATIVE NEGATIVE Final    Comment: (NOTE) SARS-CoV-2 target nucleic acids are NOT DETECTED.  The SARS-CoV-2 RNA is generally detectable in upper respiratory specimens during  the acute phase of infection. The lowest concentration of SARS-CoV-2 viral copies this assay can detect is 138 copies/mL. A negative result does not preclude SARS-Cov-2 infection and should not be used as the sole basis for treatment or other patient management decisions. A negative result may occur with  improper specimen collection/handling, submission of specimen other than nasopharyngeal swab, presence of viral mutation(s) within the areas targeted by this assay, and inadequate number of viral copies(<138 copies/mL). A negative result must be combined with clinical observations, patient history, and epidemiological information. The expected result is Negative.  Fact Sheet for Patients:  EntrepreneurPulse.com.au  Fact Sheet for Healthcare Providers:  IncredibleEmployment.be  This test is no t yet approved or cleared by the [place] FDA and  has been authorized for detection and/or diagnosis of SARS-CoV-2 by FDA under an Emergency Use Authorization (EUA). This EUA will remain  in effect (meaning this test can be used) for the duration of the COVID-19 declaration under Section 564(b)(1) of the Act, 21 U.S.C.section 360bbb-3(b)(1), unless the authorization is terminated  or revoked sooner.       Influenza A by PCR NEGATIVE NEGATIVE Final   Influenza B by PCR NEGATIVE NEGATIVE Final    Comment: (NOTE) The Xpert Xpress SARS-CoV-2/FLU/RSV plus assay is intended as an aid in the diagnosis of influenza from Nasopharyngeal swab specimens and should not be used as a sole basis for treatment. Nasal washings and aspirates are unacceptable for Xpert Xpress SARS-CoV-2/FLU/RSV testing.  Fact Sheet for Patients: EntrepreneurPulse.com.au  Fact Sheet for Healthcare Providers: IncredibleEmployment.be  This test is not yet approved or cleared by the [place] FDA and has been authorized for detection and/or  diagnosis of SARS-CoV-2 by FDA under an Emergency Use Authorization (EUA). This EUA will remain in effect (meaning this test can be used) for the duration of the COVID-19 declaration under Section 564(b)(1) of the Act, 21 U.S.C. section 360bbb-3(b)(1), unless the authorization is terminated or revoked.  Performed at [facility], [address] [place], [place] [address]     Coagulation Studies: No results for input(s): LABPROT, INR in the last 72 hours.  Urinalysis: No results for input(s): COLORURINE, LABSPEC, PHURINE, GLUCOSEU, HGBUR, BILIRUBINUR, KETONESUR, PROTEINUR, UROBILINOGEN, NITRITE, LEUKOCYTESUR in the last 72 hours.  Invalid input(s): APPERANCEUR    Imaging: No results found.   Medications:    [MAR Hold]  ceFAZolin (ANCEF) IV 1 g (06/17/21 1452)     fentaNYL       midazolam       [MAR Hold] amLODipine  10 mg Oral Daily   [MAR Hold] aspirin EC  81 mg Oral Daily   [MAR Hold] atorvastatin  40 mg Oral q1800   [MAR Hold] calcitRIOL  0.25 mcg Oral Daily   [MAR Hold] carvedilol  6.25 mg Oral BID WC   [MAR Hold] citalopram  20 mg Oral Daily   [MAR Hold] finasteride  5 mg Oral Daily   [MAR Hold] hydrALAZINE  100 mg Oral TID   [MAR Hold] isosorbide mononitrate  60 mg Oral Daily   [MAR Hold] pantoprazole  40 mg Oral Daily   [MAR Hold] sodium chloride flush  3 mL Intravenous Q12H   [MAR Hold] tamsulosin  0.4 mg Oral Daily   [MAR Hold] umeclidinium bromide  1 puff Inhalation Daily   [MAR Hold] cyanocobalamin  1,000 mcg Oral Daily   [MAR Hold] acetaminophen, [MAR Hold] albuterol, [MAR Hold] dextromethorphan-guaiFENesin, fentaNYL, [MAR Hold] hydrALAZINE, [MAR Hold] loratadine, midazolam, [MAR Hold] nitroGLYCERIN, [MAR Hold] ondansetron (ZOFRAN) IV, [MAR Hold] sodium chloride  Assessment/ Plan:  Mr. MARKEE MATERA is a 81 y.o.  male male with past medical conditions including CAD, hypertension, asthma, hyperlipidemia, systolic heart failure, GERD, AAA  repair, and chronic kidney disease stage IV.  Patient presents to the emergency department with complaints of shortness of breath.  Patient has been admitted for Shortness of breath [R06.02] SOB (shortness of breath) [R06.02] Elevated troponin [R77.8] Acute on chronic systolic CHF (congestive heart failure) (HCC) [I50.23] Congestive heart failure, unspecified HF chronicity, unspecified heart failure type ([place]) [I50.9]   Acute Kidney Injury on chronic kidney disease stage IV with baseline creatinine 3.4 and GFR of 16 on 05/30/21.  Acute kidney injury secondary to what appears to be cardiorenal syndrome Losartan currently held.  No IV contrast exposure.  No acute indication for dialysis.        Renal function continues to decrease.  Patient continues to make urine, though amount fluctuates.  Discussed with patient the pros and cons of initiating dialysis.  Patient agreeable to proceed with dialysis.  It is explained to patient that dialysis may be a long-term treatment for him.  Patient understands.  After discussing the different modalities of renal replacement therapy, patient and sister feel that in center would be most appropriate.  We will consult vascular for placement of PermCath.  Will initiate dialysis treatment tomorrow.  Dialysis coordinator aware of patient and outpatient placement will begin.  We will continue to monitor patient daily.  Lab Results  Component Value Date   CREATININE 5.76 (H) 06/17/2021   CREATININE 5.35 (H) 06/16/2021   CREATININE 5.18 (H) 06/15/2021    Intake/Output Summary (Last 24 hours) at 06/17/2021 1457 Last data filed at 06/17/2021 1028 Gross per 24 hour  Intake 240 ml  Output 500 ml  Net -260 ml    2.  Hypertension with chronic kidney disease.  Home regimen includes carvedilol, furosemide, hydralazine, isosorbide, losartan and spironolactone.  Diuretics remain held  3.  Chronic systolic heart failure.Previous echo from prior admission shows EF 40 to 45%  with mildly decreased left ventricular function and mild LVH.  Cardiology following and recommend conservative management.  4.Anemia of chronic disease Patient hemoglobin remains low Patient iron saturation when checked on February 15 were within normal limits We will follow closely and then decide further need for Epogen    LOS: 7 Clayson Riling 3/7/20232:57 PM

## 2021-06-17 NOTE — Interval H&P Note (Signed)
History and Physical Interval Note: ? ?06/17/2021 ?2:27 PM ? ?Peter [name]  has presented today for surgery, with the diagnosis of ESRD.  The various methods of treatment have been discussed with the patient and family. After consideration of risks, benefits and other options for treatment, the patient has consented to  Procedure(s): ?DIALYSIS/PERMA CATHETER INSERTION (N/A) as a surgical intervention.  The patient's history has been reviewed, patient examined, no change in status, stable for surgery.  I have reviewed the patient's chart and labs.  Questions were answered to the patient's satisfaction.   ? ? ?Hortencia Pilar ? ? ?

## 2021-06-17 NOTE — Progress Notes (Signed)
PT Cancellation Note ? ?Patient Details ?Name: Peter [name] ?MRN: [id number] ?DOB: 06-04-1940 ? ? ?Cancelled Treatment:    Reason Eval/Treat Not Completed: Patient at procedure or test/unavailable. Upon entry to room, transport present to bring pt to procedure for permacath placement. Will re-attempt at later time/date. ? ? ?Salem Caster. Fairly IV, PT, DPT ?Physical Therapist- [place]  ?[facility]  ?06/17/2021, 1:55 PM ?

## 2021-06-17 NOTE — Progress Notes (Addendum)
Progress Note    Peter [name]  HWE:[id number] DOB: Jan 24, 1941  DOA: 06/10/2021 PCP: Tracie Harrier, MD      Brief Narrative:    Medical records reviewed and are as summarized below:  Peter [name] is a 81 y.o. male with medical history significant of sCHF with EF 40-45%, CAD with multiple stent placement, hypertension, hyperlipidemia, asthma, as needed oxygen at home, GERD, depression, AAA repair, aortic arch aneurysm, former smoker (recently quit smoking), CKD-4, AICD placement. Patient was recently hospitalized from 2/15 - 2/22 due to chest pain and non-STEMI.  He also had black stool bowel movement, GI was consulted, EGD was done, which showed gastritis.  Patient's Plavix is on hold.    He presented to the hospital because of shortness of breath and cough.   He was admitted to the hospital for acute on chronic systolic CHF. Troponins were also significantly elevated but this was attributed to demand ischemia.  He did not complain of any chest pain.    Assessment/Plan:   Principal Problem:   Acute on chronic systolic CHF (congestive heart failure) (HCC) Active Problems:   CAD (coronary artery disease)   HTN (hypertension)   Hyperlipidemia   BPH (benign prostatic hyperplasia)   NSTEMI (non-ST elevated myocardial infarction) (HCC)   AKI (acute kidney injury) (HCC)   Normocytic anemia   HLD (hyperlipidemia)   CKD (chronic kidney disease), stage IV (HCC)   Chronic respiratory failure with hypoxia (HCC)   Body mass index is 27.77 kg/m.  Acute on chronic systolic CHF: Compensated.  Lasix on hold.  CAD with recent NSTEMI on 05/28/2021, elevated troponins:   Latest Reference Range & Units 06/10/21 09:50 06/10/21 12:30 06/10/21 19:14 06/11/21 02:06 06/11/21 06:06 06/11/21 10:03 06/11/21 11:24  Troponin I (High Sensitivity) <18 ng/L 525 (HH) 858 (HH) 2,587 (HH) 3,025 (HH) 3,184 (HH) 3,038 (HH) 3,324 (HH)  (HH):  Elevated troponin suspected to be due to demand  ischemia per cardiologist.  Worsening AKI on CKD stage IV, likely progressed to CKD stage V: Restarted low rate IV fluids because of poor oral intake.  Plan for permacath placement today to initiate hemodialysis.  Chronic hypoxic respiratory failure: Continue 2 L/min oxygen via nasal cannula  Leukocytosis: Improved  Other comorbidities include hypertension, hyperlipidemia, BPH, chronic normocytic anemia, asthma, chronic thrombocytopenia  Tuberculin skin test done on 06/13/2021 was negative.  Diagnoses, prognosis and goals of care were discussed with the patient and his daughter at the bedside.  Patient wants to remain full code for now. Janett Billow, RN, was at the bedside   Diet Order             Diet NPO time specified  Diet effective now                   Consultants: Cardiologist Nephrologist  Procedures: None    Medications:    amLODipine  10 mg Oral Daily   aspirin EC  81 mg Oral Daily   atorvastatin  40 mg Oral q1800   calcitRIOL  0.25 mcg Oral Daily   carvedilol  6.25 mg Oral BID WC   citalopram  20 mg Oral Daily   finasteride  5 mg Oral Daily   hydrALAZINE  100 mg Oral TID   isosorbide mononitrate  60 mg Oral Daily   pantoprazole  40 mg Oral Daily   sodium chloride flush  3 mL Intravenous Q12H   tamsulosin  0.4 mg Oral Daily   umeclidinium bromide  1  puff Inhalation Daily   cyanocobalamin  1,000 mcg Oral Daily   Continuous Infusions:  [START ON 06/18/2021]  ceFAZolin (ANCEF) IV        Anti-infectives (From admission, onward)    Start     Dose/Rate Route Frequency Ordered Stop   06/18/21 0000  ceFAZolin (ANCEF) IVPB 1 g/50 mL premix       Note to Pharmacy: Send with pt to OR   1 g 100 mL/hr over 30 Minutes Intravenous On call 06/17/21 1141 06/19/21 0000              Family Communication/Anticipated D/C date and plan/Code Status   DVT prophylaxis: SCDs Start: 06/10/21 0920     Code Status: Full Code  Family Communication: Plan  discussed with Robin, daughter, at the bedside Disposition Plan: Plan to discharge to ALF in 3 to 4 days   Status is: Inpatient Remains inpatient appropriate because: Shortness of breath, plan to initiate hemodialysis               Subjective:   Interval events noted.  He is still not eating or drinking much because of poor appetite.  [name], daughter, was at the bedside.  Bailey Mech said patient reported seeing bugs on the ceiling.  Objective:    Vitals:   06/16/21 2323 06/17/21 0354 06/17/21 0828 06/17/21 1128  BP: (!) 143/62 (!) 147/64 (!) 142/60 (!) 125/48  Pulse: 61 66 70 (!) 103  Resp: 18  20   Temp: (!) 97.5 F (36.4 C)  (!) 97.4 F (36.3 C) 98.1 F (36.7 C)  TempSrc:      SpO2: 98% 94% 94% 100%  Weight: 90.3 kg     Height:       No data found.   Intake/Output Summary (Last 24 hours) at 06/17/2021 1357 Last data filed at 06/17/2021 1028 Gross per 24 hour  Intake 240 ml  Output 500 ml  Net -260 ml   Filed Weights   06/15/21 0526 06/16/21 0336 06/16/21 2323  Weight: 93.5 kg 94 kg 90.3 kg    Exam:  GEN: NAD SKIN: No rash EYES: EOMI ENT: MMM CV: RRR PULM: CTA B ABD: soft, ND, NT, +BS CNS: AAO x 3, non focal EXT: No edema or tenderness          Data Reviewed:   I have personally reviewed following labs and imaging studies:  Labs: Labs show the following:   Basic Metabolic Panel: Recent Labs  Lab 06/11/21 0206 06/12/21 0806 06/13/21 0532 06/14/21 0540 06/15/21 0556 06/16/21 0414 06/17/21 0616  NA 140   < > 136 138 137 138 135  K 3.9   < > 4.0 4.0 3.9 4.1 4.0  CL 109   < > 105 106 105 106 105  CO2 22   < > 22 22 20* 21* 19*  GLUCOSE 100*   < > 105* 105* 118* 114* 99  BUN 62*   < > 78* 85* 84* 84* 88*  CREATININE 3.87*   < > 4.89* 4.99* 5.18* 5.35* 5.76*  CALCIUM 8.0*   < > 7.9* 8.1* 8.1* 8.1* 8.0*  MG 1.7  --   --   --   --   --   --   PHOS  --   --   --   --   --  4.5  --    < > = values in this interval not displayed.    GFR Estimated Creatinine Clearance: 10.7 mL/min (A) (by C-G  formula based on SCr of 5.76 mg/dL (H)). Liver Function Tests: Recent Labs  Lab 06/16/21 0414  ALBUMIN 2.5*   No results for input(s): LIPASE, AMYLASE in the last 168 hours. No results for input(s): AMMONIA in the last 168 hours. Coagulation profile No results for input(s): INR, PROTIME in the last 168 hours.  CBC: Recent Labs  Lab 06/11/21 0206 06/12/21 0806 06/13/21 0532 06/15/21 0556  WBC 18.3* 13.6* 11.1* 7.9  NEUTROABS  --  11.3* 9.1* 5.7  HGB 8.4* 8.8* 8.4* 8.6*  HCT 26.2* 27.1* 25.8* 26.4*  MCV 90.3 89.7 89.9 88.9  PLT 109* 114* 107* 121*   Cardiac Enzymes: No results for input(s): CKTOTAL, CKMB, CKMBINDEX, TROPONINI in the last 168 hours. BNP (last 3 results) No results for input(s): PROBNP in the last 8760 hours. CBG: No results for input(s): GLUCAP in the last 168 hours. D-Dimer: No results for input(s): DDIMER in the last 72 hours. Hgb A1c: No results for input(s): HGBA1C in the last 72 hours.  Lipid Profile: No results for input(s): CHOL, HDL, LDLCALC, TRIG, CHOLHDL, LDLDIRECT in the last 72 hours. Thyroid function studies: No results for input(s): TSH, T4TOTAL, T3FREE, THYROIDAB in the last 72 hours.  Invalid input(s): FREET3 Anemia work up: No results for input(s): VITAMINB12, FOLATE, FERRITIN, TIBC, IRON, RETICCTPCT in the last 72 hours. Sepsis Labs: Recent Labs  Lab 06/11/21 0206 06/12/21 0806 06/13/21 0532 06/15/21 0556  WBC 18.3* 13.6* 11.1* 7.9    Microbiology Recent Results (from the past 240 hour(s))  Resp Panel by RT-PCR (Flu A&B, Covid) Nasopharyngeal Swab     Status: None   Collection Time: 06/10/21  7:00 AM   Specimen: Nasopharyngeal Swab; Nasopharyngeal(NP) swabs in vial transport medium  Result Value Ref Range Status   SARS Coronavirus 2 by RT PCR NEGATIVE NEGATIVE Final    Comment: (NOTE) SARS-CoV-2 target nucleic acids are NOT DETECTED.  The SARS-CoV-2 RNA is  generally detectable in upper respiratory specimens during the acute phase of infection. The lowest concentration of SARS-CoV-2 viral copies this assay can detect is 138 copies/mL. A negative result does not preclude SARS-Cov-2 infection and should not be used as the sole basis for treatment or other patient management decisions. A negative result may occur with  improper specimen collection/handling, submission of specimen other than nasopharyngeal swab, presence of viral mutation(s) within the areas targeted by this assay, and inadequate number of viral copies(<138 copies/mL). A negative result must be combined with clinical observations, patient history, and epidemiological information. The expected result is Negative.  Fact Sheet for Patients:  EntrepreneurPulse.com.au  Fact Sheet for Healthcare Providers:  IncredibleEmployment.be  This test is no t yet approved or cleared by the [place] FDA and  has been authorized for detection and/or diagnosis of SARS-CoV-2 by FDA under an Emergency Use Authorization (EUA). This EUA will remain  in effect (meaning this test can be used) for the duration of the COVID-19 declaration under Section 564(b)(1) of the Act, 21 U.S.C.section 360bbb-3(b)(1), unless the authorization is terminated  or revoked sooner.       Influenza A by PCR NEGATIVE NEGATIVE Final   Influenza B by PCR NEGATIVE NEGATIVE Final    Comment: (NOTE) The Xpert Xpress SARS-CoV-2/FLU/RSV plus assay is intended as an aid in the diagnosis of influenza from Nasopharyngeal swab specimens and should not be used as a sole basis for treatment. Nasal washings and aspirates are unacceptable for Xpert Xpress SARS-CoV-2/FLU/RSV testing.  Fact Sheet for Patients: EntrepreneurPulse.com.au  Fact Sheet for  Healthcare Providers: IncredibleEmployment.be  This test is not yet approved or cleared by the [place] and has been authorized for detection and/or diagnosis of SARS-CoV-2 by FDA under an Emergency Use Authorization (EUA). This EUA will remain in effect (meaning this test can be used) for the duration of the COVID-19 declaration under Section 564(b)(1) of the Act, 21 U.S.C. section 360bbb-3(b)(1), unless the authorization is terminated or revoked.  Performed at [facility], [address] [place], [place] [address]     Procedures and diagnostic studies:  No results found.             LOS: 7 days   Jaylynne Birkhead  [facility]   Pager on [email or web address]. If 7PM-7AM, please contact night-coverage at [email or web address]     06/17/2021, 1:57 PM

## 2021-06-17 NOTE — H&P (View-Only) (Signed)
$'@LOGO'N$ @   MRN : [id number]  Peter [name] is a 81 y.o. ([birth year]) male who presents with chief complaint of need to start dialysis.  History of Present Illness:  I am asked to evaluate the patient by Dr. Candiss Norse.  The patient is an 81 year old gentleman who was admitted to [facility] approximately 8 days ago.  The patient is being seen for evaluation for dialysis access. The patient has been admitted to the hospital secondary to shortness of breath.  However during his hospital course his chronic renal insufficiency has progressed and he is now at the point that he needs to start hemodialysis.  The patient denies amaurosis fugax or recent TIA symptoms. There are no recent neurological changes noted. The patient denies claudication symptoms or rest pain symptoms. The patient denies history of DVT, PE or superficial thrombophlebitis. The patient denies recent episodes of angina or shortness of breath.    Current Meds  Medication Sig   amLODipine (NORVASC) 10 MG tablet Take 1 tablet (10 mg total) by mouth daily.   atorvastatin (LIPITOR) 40 MG tablet Take 1 tablet (40 mg total) by mouth daily at 6 PM.   calcitRIOL (ROCALTROL) 0.25 MCG capsule Take 0.25 mcg by mouth daily.   carvedilol (COREG) 6.25 MG tablet Take 1 tablet (6.25 mg total) by mouth 2 (two) times daily with a meal.   cetirizine (ZYRTEC) 10 MG tablet Take 10 mg by mouth.   citalopram (CELEXA) 20 MG tablet Take 20 mg by mouth daily.   clobetasol cream (TEMOVATE) 7.67 % Apply 1 application topically 2 (two) times daily as needed (psoriasis).   cyanocobalamin 1000 MCG tablet Take 1,000 mcg by mouth daily.    finasteride (PROSCAR) 5 MG tablet Take 1 tablet (5 mg total) by mouth daily.   GLUCOSAMINE-CHONDROITIN-VIT C PO Take 1 tablet by mouth daily.   hydrALAZINE (APRESOLINE) 100 MG tablet Take 1 tablet (100 mg total) by mouth 3 (three) times daily.   isosorbide mononitrate (IMDUR) 30 MG 24 hr tablet Take 1 tablet (30 mg  total) by mouth daily.   omeprazole (PRILOSEC) 20 MG capsule Take 20 mg by mouth in the morning.   tamsulosin (FLOMAX) 0.4 MG CAPS capsule Take 1 capsule (0.4 mg total) by mouth daily.   umeclidinium bromide (INCRUSE ELLIPTA) 62.5 MCG/ACT AEPB Inhale 1 puff into the lungs daily.    Past Medical History:  Diagnosis Date   Anginal pain ([place])    Asthma    Bladder infection, acute 03/2011   "had a whole lot of bleeding from this"   CHF (congestive heart failure) ([place])    Coronary artery disease    High cholesterol    Hypertension    Macular degeneration 12/14/2011   "had it in my right; getting shots now in my left"   Myocardial infarction [facility]) 1996   NSTEMI (non-ST elevated myocardial infarction) ([place]) 12/14/2011   Shortness of breath 12/14/2011   "@ rest, lying down, w/exertion"    Past Surgical History:  Procedure Laterality Date   [place]; ~ 2003; ~ 2008   "total of 3"   CORONARY STENT INTERVENTION N/A 08/10/2016   Procedure: Coronary Stent Intervention;  Surgeon: [name], MD;  Location: [place] [facility];  Service: Cardiovascular;  Laterality: N/A;   HERNIA REPAIR  ~ 2001   "abdominal w/mesh implanted"   LEFT HEART CATH AND CORONARY ANGIOGRAPHY N/A 08/10/2016   Procedure: Left Heart Cath and Coronary Angiography;  Surgeon: [name],  MD;  Location: [place] [facility];  Service: Cardiovascular;  Laterality: N/A;   LEFT HEART CATHETERIZATION WITH CORONARY ANGIOGRAM N/A 12/16/2011   Procedure: LEFT HEART CATHETERIZATION WITH CORONARY ANGIOGRAM;  Surgeon: Minus Breeding, MD;  Location[facility] C[facility] LAB;  Service: Cardiovascular;  Laterality: N/A;   PERCUTANEOUS CORONARY STENT INTERVENTION (PCI-S) N/A 12/17/2011   Procedure: PERCUTANEOUS CORONARY STENT INTERVENTION (PCI-S);  Surgeon: Sherren Mocha, MD;  Location: [facility] CATH LAB;  Service: Cardiovascular;  Laterality: N/A;   TONSILLECTOMY     "I was a kid"    Social History Social  History   Tobacco Use   Smoking status: Former    Packs/day: 0.50    Years: 0.50    Pack years: 0.25    Types: Cigarettes   Smokeless tobacco: Never   Tobacco comments:    11/23/16 Still not ready to quit smoking.  Substance Use Topics   Alcohol use: Yes    Alcohol/week: 1.0 standard drink    Types: 1 Cans of beer per week    Comment: 12/14/2011 "if my kidneys are sore, I drink 1 beer/day; if not sore; no beer; occasionally drink socially; ave 1 beer/wk maybe"   Drug use: No    Family History Family History  Family history unknown: Yes    Allergies  Allergen Reactions   Iodinated Contrast Media Shortness Of Breath   Iodinated Glycerol  [Glycerol, Iodinated] Shortness Of Breath     REVIEW OF SYSTEMS (Negative unless checked)  Constitutional: '[]'$ Weight loss  '[]'$ Fever  '[]'$ Chills Cardiac: '[]'$ Chest pain   '[]'$ Chest pressure   '[]'$ Palpitations   '[]'$ Shortness of breath when laying flat   '[]'$ Shortness of breath with exertion. Vascular:  '[]'$ Pain in legs with walking   '[]'$ Pain in legs at rest  '[]'$ History of DVT   '[]'$ Phlebitis   '[]'$ Swelling in legs   '[]'$ Varicose veins   '[]'$ Non-healing ulcers Pulmonary:   '[]'$ Uses home oxygen   '[]'$ Productive cough   '[]'$ Hemoptysis   '[]'$ Wheeze  '[]'$ COPD   '[]'$ Asthma Neurologic:  '[]'$ Dizziness   '[]'$ Seizures   '[]'$ History of stroke   '[]'$ History of TIA  '[]'$ Aphasia   '[]'$ Vissual changes   '[]'$ Weakness or numbness in arm   '[]'$ Weakness or numbness in leg Musculoskeletal:   '[]'$ Joint swelling   '[]'$ Joint pain   '[]'$ Low back pain Hematologic:  '[]'$ Easy bruising  '[]'$ Easy bleeding   '[]'$ Hypercoagulable state   '[]'$ Anemic Gastrointestinal:  '[]'$ Diarrhea   '[]'$ Vomiting  '[]'$ Gastroesophageal reflux/heartburn   '[]'$ Difficulty swallowing. Genitourinary:  '[x]'$ Chronic kidney disease   '[]'$ Difficult urination  '[]'$ Frequent urination   '[]'$ Blood in urine Skin:  '[]'$ Rashes   '[]'$ Ulcers  Psychological:  '[]'$ History of anxiety   '[]'$  History of major depression.  Physical Examination  Vitals:   06/17/21 0354 06/17/21 0828 06/17/21 1128 06/17/21  1413  BP: (!) 147/64 (!) 142/60 (!) 125/48 (!) 126/49  Pulse: 66 70 (!) 103 (!) 52  Resp:  20  12  Temp:  (!) 97.4 F (36.3 C) 98.1 F (36.7 C) 98 F (36.7 C)  TempSrc:    Oral  SpO2: 94% 94% 100% 98%  Weight:      Height:       Body mass index is 27.77 kg/m. Gen: WD/WN, NAD Head: [place]/AT, No temporalis wasting.  Ear/Nose/Throat: Hearing grossly intact, nares w/o erythema or drainage Eyes: PER, EOMI, sclera nonicteric.  Neck: Supple, no gross masses or lesions.  No JVD.  Pulmonary:  Good air movement, no audible wheezing, no use of accessory muscles.  Cardiac: RRR, precordium non-hyperdynamic. Vascular:    Vessel  Right Left  Radial Palpable Palpable  Brachial Palpable Palpable  Gastrointestinal: soft, non-distended. No guarding/no peritoneal signs.  Musculoskeletal: M/S 5/5 throughout.  No deformity.  Neurologic: CN 2-12 intact. Pain and light touch intact in extremities.  Symmetrical.  Speech is fluent. Motor exam as listed above. Psychiatric: Judgment intact, Mood & affect appropriate for pt's clinical situation. Dermatologic: No rashes or ulcers noted.  No changes consistent with cellulitis.   CBC Lab Results  Component Value Date   WBC 7.9 06/15/2021   HGB 8.6 (L) 06/15/2021   HCT 26.4 (L) 06/15/2021   MCV 88.9 06/15/2021   PLT 121 (L) 06/15/2021    BMET    Component Value Date/Time   NA 135 06/17/2021 0616   NA 142 12/12/2013 0401   K 4.0 06/17/2021 0616   K 3.2 (L) 12/12/2013 0401   CL 105 06/17/2021 0616   CL 106 12/12/2013 0401   CO2 19 (L) 06/17/2021 0616   CO2 29 12/12/2013 0401   GLUCOSE 99 06/17/2021 0616   GLUCOSE 104 (H) 12/12/2013 0401   BUN 88 (H) 06/17/2021 0616   BUN 20 (H) 12/12/2013 0401   CREATININE 5.76 (H) 06/17/2021 0616   CREATININE 1.34 (H) 12/12/2013 0401   CALCIUM 8.0 (L) 06/17/2021 0616   CALCIUM 8.1 (L) 12/12/2013 0401   GFRNONAA 9 (L) 06/17/2021 0616   GFRNONAA 52 (L) 12/12/2013 0401   GFRAA 54 (L) 12/21/2017 1203   GFRAA  >60 12/12/2013 0401   Estimated Creatinine Clearance: 10.7 mL/min (A) (by C-G formula based on SCr of 5.76 mg/dL (H)).  COAG Lab Results  Component Value Date   INR 1.3 (H) 05/28/2021   INR 1.1 04/04/2021   INR 1.0 12/09/2013    Radiology CT CHEST WO CONTRAST  Result Date: 05/28/2021 CLINICAL DATA:  Shortness of breath. History of congestive heart failure. Substantial smoking history. EXAM: CT CHEST WITHOUT CONTRAST TECHNIQUE: Multidetector CT imaging of the chest was performed following the standard protocol without IV contrast. RADIATION DOSE REDUCTION: This exam was performed according to the departmental dose-optimization program which includes automated exposure control, adjustment of the mA and/or kV according to patient size and/or use of iterative reconstruction technique. COMPARISON:  Chest radiograph 05/01/2021 and chest CT 04/12/2021 FINDINGS: Cardiovascular: Coronary, aortic arch, and branch vessel atherosclerotic vascular disease. Coronary stents. AICD noted. 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 Small anterior pericardial effusion. Borderline cardiomegaly. Descending thoracic aortic and upper abdominal aortic stent. Mediastinum/Nodes: AP window lymph node 0.8 cm in short axis on image 62 series 2, stable. Low-density subcarinal lymph nodes including a 1.0 cm node on image 89 series 2, formerly the same. Other scattered small mediastinal lymph nodes are present. Lungs/Pleura: Small bilateral pleural effusions although both are increased from 04/12/2021. Centrilobular emphysema. Mild secondary pulmonary lobular septal accentuation of the apices. Bilateral airway thickening. 1.0 by 0.6 cm stable ground-glass density nodule in the apicoposterior segment left upper lobe on image 22 series 3. Ground-glass density peribronchovascular nodule in the left upper lobe on image 33 series 3 is stable at 1.2 by 0.7 cm. Ground-glass  density 1.7 by 1.0 cm right lower lobe nodule on image 72 series 3. Increased density of a 0.8 by 0.5 cm right upper lobe sub solid pulmonary nodule on image 62 series 3 compared to the 04/12/2021 exam. Mildly increased atelectasis in the left lower lobe compared to previous. Upper Abdomen: Small depending gallstones in the gallbladder.  Upper abdominal aortic stent with stents in the proximal celiac trunk, SMA, and renal arteries. We partially image a ventral hernia containing adipose tissue on image 185 series 2. Musculoskeletal: Bilateral gynecomastia. 2.0 by 0.6 cm sclerotic lesion in the right posterolateral sixth rib, no change from 12/14/2011, considered benign. IMPRESSION: 1. Small but mildly worsened bilateral pleural effusions with associated passive atelectasis. 2. Airway thickening is present, suggesting bronchitis or reactive airways disease. 3. Several ground-glass density pulmonary nodules are present including two left apical nodule stable from 04/12/2021 (questionable precursors of these lesions on 12/14/2011) and a right upper lobe nodule which has increased in density compared to that time, as well as a stable right lower lobe ground-glass density pulmonary nodule. Low-grade adenocarcinoma is a possibility and surveillance is recommended. 4. Aortic Atherosclerosis (ICD10-I70.0) and Emphysema (ICD10-J43.9). Coronary atherosclerosis. 5. 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 Recommend semi-annual imaging followup by CTA or MRA and referral to cardiothoracic surgery if not already obtained. This recommendation follows 2010 ACCF/AHA/AATS/ACR/ASA/SCA/SCAI/SIR/STS/SVM Guidelines for the Diagnosis and Management of Patients With Thoracic Aortic Disease. Circulation. 2010; 121: I696-E95. Aortic aneurysm NOS (ICD10-I71.9) 6. Coronary and abdominal aortic stents along with stents in the celiac trunk, SMA, and proximal renal arteries. AICD noted. 7. Small anterior pericardial effusion. 8. Cholelithiasis. 9.  Partially imaged ventral hernia containing adipose tissue. Electronically Signed   By: Van Clines M.D.   On: 05/28/2021 08:44   DG Chest Portable 1 View  Result Date: 06/10/2021 CLINICAL DATA:  81 year old male with shortness of breath and nausea. Former smoker. EXAM: PORTABLE CHEST 1 VIEW COMPARISON:  Portable chest 05/28/2021 and earlier. FINDINGS: Portable AP upright views at 0651 hours. Stable left chest cardiac AICD. Stable mild cardiomegaly and mediastinal contours. Visualized tracheal air column is within normal limits. Chronic increased pulmonary interstitial markings with mild basilar predominance on the right appears stable since January. No superimposed pneumothorax, pleural effusion, or consolidation. No acute osseous abnormality identified. Negative visible bowel gas. IMPRESSION: Stable portable appearance of the chest since January, with suspected chronic interstitial changes. No acute cardiopulmonary abnormality. Electronically Signed   By: Genevie Ann M.D.   On: 06/10/2021 07:45   DG Chest Portable 1 View  Result Date: 05/28/2021 CLINICAL DATA:  Shortness of breath, chest pain EXAM: PORTABLE CHEST 1 VIEW COMPARISON:  05/01/2021 FINDINGS: Lungs are clear.  No pleural effusion or pneumothorax. The heart is normal in size.  Left subclavian ICD. Mild degenerative changes of the mid thoracic spine. IMPRESSION: No evidence of acute cardiopulmonary disease. Electronically Signed   By: Julian Hy M.D.   On: 05/28/2021 19:04   DG UGI W SINGLE CM (SOL OR THIN BA)  Result Date: 05/30/2021 CLINICAL DATA:  Upper GI bleed. Evaluate for mucosal ulceration/defect. EXAM: DG UGI W SINGLE CM TECHNIQUE: Scout radiograph was obtained. Single contrast examination was performed using thin liquid barium. This exam was performed by Candiss Norse, PA, and was supervised and interpreted by myself. FLUOROSCOPY TIME:  Radiation Exposure Index (as provided by the fluoroscopic device): 44.4 mGy  COMPARISON:  Chest CT 05/27/2021.  Abdominopelvic CT 04/04/2021. FINDINGS: Scout Radiograph: Due to reported contrast allergy, the study was performed with thin barium after obtaining supine and erect views of the abdomen. The bowel gas pattern is nonobstructive. There is no evidence of pneumoperitoneum. Extensive postsurgical changes are noted, related to previous aortoiliac stent grafting. The patient has an AICD. Study was performed in the supine and semi erect positions. Patient was unable to stand or lie prone. The patient  swallowed the barium without difficulty. The esophageal motility appears within normal limits. There is no evidence of stricture, mass or ulceration. Mild gastric fold thickening is noted which could indicate gastritis. No evidence of mucosal ulceration. The duodenum appears normal. IMPRESSION: 1. No specific cause for upper GI bleeding identified. There is no evidence of mucosal ulceration. 2. Gastric fold thickening which could indicate gastritis. 3. No significant esophageal abnormality. Electronically Signed   By: Richardean Sale M.D.   On: 05/30/2021 13:12   ECHOCARDIOGRAM COMPLETE  Result Date: 05/29/2021    ECHOCARDIOGRAM REPORT   Patient Name:   Peter [name] Date of Exam: 05/29/2021 Medical Rec #:  [id number]        Height:       70.0 in Accession #:    [id number]       Weight:       204.0 lb Date of Birth:  1940-10-25       BSA:          2.105 m Patient Age:    38 years         BP:           166/66 mmHg Patient Gender: M                HR:           62 bpm. Exam Location:  [facility] Procedure: 2D Echo, Color Doppler, Cardiac Doppler and Intracardiac            Opacification Agent Indications:     R07.9 Chest Pain  History:         Patient has prior history of Echocardiogram examinations, most                  recent 04/04/2021. CHF, CAD, Pacemaker; Risk Factors:HCL.  Sonographer:     Charmayne Sheer Referring Phys:  [place] Diagnosing Phys: Serafina Royals MD  Sonographer  Comments: Technically difficult study due to poor echo windows. Image acquisition challenging due to respiratory motion. IMPRESSIONS  1. Left ventricular ejection fraction, by estimation, is 40 to 45%. The left ventricle has mildly decreased function. The left ventricle demonstrates global hypokinesis. The left ventricular internal cavity size was mildly dilated. Left ventricular diastolic parameters were normal.  2. Right ventricular systolic function is normal. The right ventricular size is normal.  3. Left atrial size was mildly dilated.  4. Right atrial size was mildly dilated.  5. The mitral valve is normal in structure. Mild to moderate mitral valve regurgitation.  6. Tricuspid valve regurgitation is mild to moderate.  7. The aortic valve is normal in structure. Aortic valve regurgitation is not visualized. FINDINGS  Left Ventricle: Left ventricular ejection fraction, by estimation, is 40 to 45%. The left ventricle has mildly decreased function. The left ventricle demonstrates global hypokinesis. Definity contrast agent was given IV to delineate the left ventricular  endocardial borders. The left ventricular internal cavity size was mildly dilated. There is no left ventricular hypertrophy. Left ventricular diastolic parameters were normal. Right Ventricle: The right ventricular size is normal. No increase in right ventricular wall thickness. Right ventricular systolic function is normal. Left Atrium: Left atrial size was mildly dilated. Right Atrium: Right atrial size was mildly dilated. Pericardium: There is no evidence of pericardial effusion. Mitral Valve: The mitral valve is normal in structure. Mild to moderate mitral valve regurgitation. MV peak gradient, 6.6 mmHg. The mean mitral valve gradient is 4.0 mmHg. Tricuspid Valve: The tricuspid valve is  normal in structure. Tricuspid valve regurgitation is mild to moderate. Aortic Valve: The aortic valve is normal in structure. Aortic valve regurgitation is  not visualized. Aortic valve mean gradient measures 11.0 mmHg. Aortic valve peak gradient measures 19.0 mmHg. Aortic valve area, by VTI measures 2.27 cm. Pulmonic Valve: The pulmonic valve was normal in structure. Pulmonic valve regurgitation is trivial. Aorta: The aortic root and ascending aorta are structurally normal, with no evidence of dilitation. IAS/Shunts: No atrial level shunt detected by color flow Doppler.  LEFT VENTRICLE PLAX 2D LVIDd:         5.23 cm   Diastology LVIDs:         4.07 cm   LV e' medial:    5.33 cm/s LV PW:         1.39 cm   LV E/e' medial:  18.0 LV IVS:        1.11 cm   LV e' lateral:   5.66 cm/s LVOT diam:     2.50 cm   LV E/e' lateral: 16.9 LV SV:         116 LV SV Index:   55 LVOT Area:     4.91 cm  RIGHT VENTRICLE RV Basal diam:  4.45 cm LEFT ATRIUM           Index        RIGHT ATRIUM           Index LA diam:      3.70 cm 1.76 cm/m   RA Area:     17.10 cm LA Vol (A4C): 73.3 ml 34.82 ml/m  RA Volume:   40.00 ml  19.00 ml/m  AORTIC VALVE                     PULMONIC VALVE AV Area (Vmax):    2.61 cm      PV Vmax:       0.87 m/s AV Area (Vmean):   2.33 cm      PV Vmean:      57.000 cm/s AV Area (VTI):     2.27 cm      PV VTI:        0.157 m AV Vmax:           218.00 cm/s   PV Peak grad:  3.0 mmHg AV Vmean:          160.000 cm/s  PV Mean grad:  2.0 mmHg AV VTI:            0.513 m AV Peak Grad:      19.0 mmHg AV Mean Grad:      11.0 mmHg LVOT Vmax:         116.00 cm/s LVOT Vmean:        75.800 cm/s LVOT VTI:          0.237 m LVOT/AV VTI ratio: 0.46  AORTA Ao Root diam: 3.40 cm MITRAL VALVE                TRICUSPID VALVE MV Area (PHT): 3.44 cm     TR Peak grad:   43.6 mmHg MV Area VTI:   3.32 cm     TR Vmax:        330.00 cm/s MV Peak grad:  6.6 mmHg MV Mean grad:  4.0 mmHg     SHUNTS MV Vmax:       1.28 m/s     Systemic VTI:  0.24 m MV Vmean:  93.5 cm/s    Systemic Diam: 2.50 cm MV Decel Time: 221 msec MV E velocity: 95.75 cm/s MV A velocity: 106.00 cm/s MV E/A ratio:  0.90  Serafina Royals MD Electronically signed by Serafina Royals MD Signature Date/Time: 05/29/2021/12:28:40 PM    Final      Assessment/Plan 1.  End-stage renal disease requiring hemodialysis:  Patient will begin dialysis therapy without further interruption.  Tunneled dialysis catheter will be placed.  Risks and benefits of been reviewed all questions answered patient agrees to proceed.  Once he has stabilized and is discharged home we will see him in follow-up and begin planning extremity access. 2.  Coronary artery disease:  EKG will be monitored. Nitrates will be used if needed. The patient's oral cardiac medications will be continued. 3.  Hypertension:  Patient will continue medical management; nephrology is following no changes in oral medications. 4. Diabetes mellitus:  Glucose will be monitored and oral medications been held this morning once the patient has undergone the patient's procedure po intake will be reinitiated and again Accu-Cheks will be used to assess the blood glucose level and treat as needed. The patient will be restarted on the patient's usual hypoglycemic regime 5.  Asthma/COPD: Continue pulmonary medications and aerosols as already ordered, these medications have been reviewed and there are no changes at this time.       Hortencia Pilar, MD  06/17/2021 2:21 PM

## 2021-06-17 NOTE — Consult Note (Signed)
$'@LOGO'Z$ @   MRN : [id number]  Peter [name] is a 81 y.o. ([birth year]) male who presents with chief complaint of need to start dialysis.  History of Present Illness:  I am asked to evaluate the patient by Dr. Candiss Norse.  The patient is an 81 year old gentleman who was admitted to [facility] approximately 8 days ago.  The patient is being seen for evaluation for dialysis access. The patient has been admitted to the hospital secondary to shortness of breath.  However during his hospital course his chronic renal insufficiency has progressed and he is now at the point that he needs to start hemodialysis.  The patient denies amaurosis fugax or recent TIA symptoms. There are no recent neurological changes noted. The patient denies claudication symptoms or rest pain symptoms. The patient denies history of DVT, PE or superficial thrombophlebitis. The patient denies recent episodes of angina or shortness of breath.    Current Meds  Medication Sig   amLODipine (NORVASC) 10 MG tablet Take 1 tablet (10 mg total) by mouth daily.   atorvastatin (LIPITOR) 40 MG tablet Take 1 tablet (40 mg total) by mouth daily at 6 PM.   calcitRIOL (ROCALTROL) 0.25 MCG capsule Take 0.25 mcg by mouth daily.   carvedilol (COREG) 6.25 MG tablet Take 1 tablet (6.25 mg total) by mouth 2 (two) times daily with a meal.   cetirizine (ZYRTEC) 10 MG tablet Take 10 mg by mouth.   citalopram (CELEXA) 20 MG tablet Take 20 mg by mouth daily.   clobetasol cream (TEMOVATE) 7.62 % Apply 1 application topically 2 (two) times daily as needed (psoriasis).   cyanocobalamin 1000 MCG tablet Take 1,000 mcg by mouth daily.    finasteride (PROSCAR) 5 MG tablet Take 1 tablet (5 mg total) by mouth daily.   GLUCOSAMINE-CHONDROITIN-VIT C PO Take 1 tablet by mouth daily.   hydrALAZINE (APRESOLINE) 100 MG tablet Take 1 tablet (100 mg total) by mouth 3 (three) times daily.   isosorbide mononitrate (IMDUR) 30 MG 24 hr tablet Take 1 tablet (30 mg  total) by mouth daily.   omeprazole (PRILOSEC) 20 MG capsule Take 20 mg by mouth in the morning.   tamsulosin (FLOMAX) 0.4 MG CAPS capsule Take 1 capsule (0.4 mg total) by mouth daily.   umeclidinium bromide (INCRUSE ELLIPTA) 62.5 MCG/ACT AEPB Inhale 1 puff into the lungs daily.    Past Medical History:  Diagnosis Date   Anginal pain ([place])    Asthma    Bladder infection, acute 03/2011   "had a whole lot of bleeding from this"   CHF (congestive heart failure) ([place])    Coronary artery disease    High cholesterol    Hypertension    Macular degeneration 12/14/2011   "had it in my right; getting shots now in my left"   Myocardial infarction [facility]) 1996   NSTEMI (non-ST elevated myocardial infarction) ([place]) 12/14/2011   Shortness of breath 12/14/2011   "@ rest, lying down, w/exertion"    Past Surgical History:  Procedure Laterality Date   [place]; ~ 2003; ~ 2008   "total of 3"   CORONARY STENT INTERVENTION N/A 08/10/2016   Procedure: Coronary Stent Intervention;  Surgeon: [name], MD;  Location: [place] [facility];  Service: Cardiovascular;  Laterality: N/A;   HERNIA REPAIR  ~ 2001   "abdominal w/mesh implanted"   LEFT HEART CATH AND CORONARY ANGIOGRAPHY N/A 08/10/2016   Procedure: Left Heart Cath and Coronary Angiography;  Surgeon: [name],  MD;  Location: [place] [facility];  Service: Cardiovascular;  Laterality: N/A;   LEFT HEART CATHETERIZATION WITH CORONARY ANGIOGRAM N/A 12/16/2011   Procedure: LEFT HEART CATHETERIZATION WITH CORONARY ANGIOGRAM;  Surgeon: Minus Breeding, MD;  Location: [facility] CATH LAB;  Service: Cardiovascular;  Laterality: N/A;   PERCUTANEOUS CORONARY STENT INTERVENTION (PCI-S) N/A 12/17/2011   Procedure: PERCUTANEOUS CORONARY STENT INTERVENTION (PCI-S);  Surgeon: Sherren Mocha, MD;  Location: [facility] CATH LAB;  Service: Cardiovascular;  Laterality: N/A;   TONSILLECTOMY     "I was a kid"    Social History Social  History   Tobacco Use   Smoking status: Former    Packs/day: 0.50    Years: 0.50    Pack years: 0.25    Types: Cigarettes   Smokeless tobacco: Never   Tobacco comments:    11/23/16 Still not ready to quit smoking.  Substance Use Topics   Alcohol use: Yes    Alcohol/week: 1.0 standard drink    Types: 1 Cans of beer per week    Comment: 12/14/2011 "if my kidneys are sore, I drink 1 beer/day; if not sore; no beer; occasionally drink socially; ave 1 beer/wk maybe"   Drug use: No    Family History Family History  Family history unknown: Yes    Allergies  Allergen Reactions   Iodinated Contrast Media Shortness Of Breath   Iodinated Glycerol  [Glycerol, Iodinated] Shortness Of Breath     REVIEW OF SYSTEMS (Negative unless checked)  Constitutional: '[]'$ Weight loss  '[]'$ Fever  '[]'$ Chills Cardiac: '[]'$ Chest pain   '[]'$ Chest pressure   '[]'$ Palpitations   '[]'$ Shortness of breath when laying flat   '[]'$ Shortness of breath with exertion. Vascular:  '[]'$ Pain in legs with walking   '[]'$ Pain in legs at rest  '[]'$ History of DVT   '[]'$ Phlebitis   '[]'$ Swelling in legs   '[]'$ Varicose veins   '[]'$ Non-healing ulcers Pulmonary:   '[]'$ Uses home oxygen   '[]'$ Productive cough   '[]'$ Hemoptysis   '[]'$ Wheeze  '[]'$ COPD   '[]'$ Asthma Neurologic:  '[]'$ Dizziness   '[]'$ Seizures   '[]'$ History of stroke   '[]'$ History of TIA  '[]'$ Aphasia   '[]'$ Vissual changes   '[]'$ Weakness or numbness in arm   '[]'$ Weakness or numbness in leg Musculoskeletal:   '[]'$ Joint swelling   '[]'$ Joint pain   '[]'$ Low back pain Hematologic:  '[]'$ Easy bruising  '[]'$ Easy bleeding   '[]'$ Hypercoagulable state   '[]'$ Anemic Gastrointestinal:  '[]'$ Diarrhea   '[]'$ Vomiting  '[]'$ Gastroesophageal reflux/heartburn   '[]'$ Difficulty swallowing. Genitourinary:  '[x]'$ Chronic kidney disease   '[]'$ Difficult urination  '[]'$ Frequent urination   '[]'$ Blood in urine Skin:  '[]'$ Rashes   '[]'$ Ulcers  Psychological:  '[]'$ History of anxiety   '[]'$  History of major depression.  Physical Examination  Vitals:   06/17/21 0354 06/17/21 0828 06/17/21 1128 06/17/21  1413  BP: (!) 147/64 (!) 142/60 (!) 125/48 (!) 126/49  Pulse: 66 70 (!) 103 (!) 52  Resp:  20  12  Temp:  (!) 97.4 F (36.3 C) 98.1 F (36.7 C) 98 F (36.7 C)  TempSrc:    Oral  SpO2: 94% 94% 100% 98%  Weight:      Height:       Body mass index is 27.77 kg/m. Gen: WD/WN, NAD Head: [place]/AT, No temporalis wasting.  Ear/Nose/Throat: Hearing grossly intact, nares w/o erythema or drainage Eyes: PER, EOMI, sclera nonicteric.  Neck: Supple, no gross masses or lesions.  No JVD.  Pulmonary:  Good air movement, no audible wheezing, no use of accessory muscles.  Cardiac: RRR, precordium non-hyperdynamic. Vascular:    Vessel  Right Left  Radial Palpable Palpable  Brachial Palpable Palpable  Gastrointestinal: soft, non-distended. No guarding/no peritoneal signs.  Musculoskeletal: M/S 5/5 throughout.  No deformity.  Neurologic: CN 2-12 intact. Pain and light touch intact in extremities.  Symmetrical.  Speech is fluent. Motor exam as listed above. Psychiatric: Judgment intact, Mood & affect appropriate for pt's clinical situation. Dermatologic: No rashes or ulcers noted.  No changes consistent with cellulitis.   CBC Lab Results  Component Value Date   WBC 7.9 06/15/2021   HGB 8.6 (L) 06/15/2021   HCT 26.4 (L) 06/15/2021   MCV 88.9 06/15/2021   PLT 121 (L) 06/15/2021    BMET    Component Value Date/Time   NA 135 06/17/2021 0616   NA 142 12/12/2013 0401   K 4.0 06/17/2021 0616   K 3.2 (L) 12/12/2013 0401   CL 105 06/17/2021 0616   CL 106 12/12/2013 0401   CO2 19 (L) 06/17/2021 0616   CO2 29 12/12/2013 0401   GLUCOSE 99 06/17/2021 0616   GLUCOSE 104 (H) 12/12/2013 0401   BUN 88 (H) 06/17/2021 0616   BUN 20 (H) 12/12/2013 0401   CREATININE 5.76 (H) 06/17/2021 0616   CREATININE 1.34 (H) 12/12/2013 0401   CALCIUM 8.0 (L) 06/17/2021 0616   CALCIUM 8.1 (L) 12/12/2013 0401   GFRNONAA 9 (L) 06/17/2021 0616   GFRNONAA 52 (L) 12/12/2013 0401   GFRAA 54 (L) 12/21/2017 1203   GFRAA  >60 12/12/2013 0401   Estimated Creatinine Clearance: 10.7 mL/min (A) (by C-G formula based on SCr of 5.76 mg/dL (H)).  COAG Lab Results  Component Value Date   INR 1.3 (H) 05/28/2021   INR 1.1 04/04/2021   INR 1.0 12/09/2013    Radiology CT CHEST WO CONTRAST  Result Date: 05/28/2021 CLINICAL DATA:  Shortness of breath. History of congestive heart failure. Substantial smoking history. EXAM: CT CHEST WITHOUT CONTRAST TECHNIQUE: Multidetector CT imaging of the chest was performed following the standard protocol without IV contrast. RADIATION DOSE REDUCTION: This exam was performed according to the departmental dose-optimization program which includes automated exposure control, adjustment of the mA and/or kV according to patient size and/or use of iterative reconstruction technique. COMPARISON:  Chest radiograph 05/01/2021 and chest CT 04/12/2021 FINDINGS: Cardiovascular: Coronary, aortic arch, and branch vessel atherosclerotic vascular disease. Coronary stents. AICD noted. 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 Small anterior pericardial effusion. Borderline cardiomegaly. Descending thoracic aortic and upper abdominal aortic stent. Mediastinum/Nodes: AP window lymph node 0.8 cm in short axis on image 62 series 2, stable. Low-density subcarinal lymph nodes including a 1.0 cm node on image 89 series 2, formerly the same. Other scattered small mediastinal lymph nodes are present. Lungs/Pleura: Small bilateral pleural effusions although both are increased from 04/12/2021. Centrilobular emphysema. Mild secondary pulmonary lobular septal accentuation of the apices. Bilateral airway thickening. 1.0 by 0.6 cm stable ground-glass density nodule in the apicoposterior segment left upper lobe on image 22 series 3. Ground-glass density peribronchovascular nodule in the left upper lobe on image 33 series 3 is stable at 1.2 by 0.7 cm. Ground-glass  density 1.7 by 1.0 cm right lower lobe nodule on image 72 series 3. Increased density of a 0.8 by 0.5 cm right upper lobe sub solid pulmonary nodule on image 62 series 3 compared to the 04/12/2021 exam. Mildly increased atelectasis in the left lower lobe compared to previous. Upper Abdomen: Small depending gallstones in the gallbladder.  Upper abdominal aortic stent with stents in the proximal celiac trunk, SMA, and renal arteries. We partially image a ventral hernia containing adipose tissue on image 185 series 2. Musculoskeletal: Bilateral gynecomastia. 2.0 by 0.6 cm sclerotic lesion in the right posterolateral sixth rib, no change from 12/14/2011, considered benign. IMPRESSION: 1. Small but mildly worsened bilateral pleural effusions with associated passive atelectasis. 2. Airway thickening is present, suggesting bronchitis or reactive airways disease. 3. Several ground-glass density pulmonary nodules are present including two left apical nodule stable from 04/12/2021 (questionable precursors of these lesions on 12/14/2011) and a right upper lobe nodule which has increased in density compared to that time, as well as a stable right lower lobe ground-glass density pulmonary nodule. Low-grade adenocarcinoma is a possibility and surveillance is recommended. 4. Aortic Atherosclerosis (ICD10-I70.0) and Emphysema (ICD10-J43.9). Coronary atherosclerosis. 5. 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 Recommend semi-annual imaging followup by CTA or MRA and referral to cardiothoracic surgery if not already obtained. This recommendation follows 2010 ACCF/AHA/AATS/ACR/ASA/SCA/SCAI/SIR/STS/SVM Guidelines for the Diagnosis and Management of Patients With Thoracic Aortic Disease. Circulation. 2010; 121: P546-F68. Aortic aneurysm NOS (ICD10-I71.9) 6. Coronary and abdominal aortic stents along with stents in the celiac trunk, SMA, and proximal renal arteries. AICD noted. 7. Small anterior pericardial effusion. 8. Cholelithiasis. 9.  Partially imaged ventral hernia containing adipose tissue. Electronically Signed   By: Van Clines M.D.   On: 05/28/2021 08:44   DG Chest Portable 1 View  Result Date: 06/10/2021 CLINICAL DATA:  81 year old male with shortness of breath and nausea. Former smoker. EXAM: PORTABLE CHEST 1 VIEW COMPARISON:  Portable chest 05/28/2021 and earlier. FINDINGS: Portable AP upright views at 0651 hours. Stable left chest cardiac AICD. Stable mild cardiomegaly and mediastinal contours. Visualized tracheal air column is within normal limits. Chronic increased pulmonary interstitial markings with mild basilar predominance on the right appears stable since January. No superimposed pneumothorax, pleural effusion, or consolidation. No acute osseous abnormality identified. Negative visible bowel gas. IMPRESSION: Stable portable appearance of the chest since January, with suspected chronic interstitial changes. No acute cardiopulmonary abnormality. Electronically Signed   By: Genevie Ann M.D.   On: 06/10/2021 07:45   DG Chest Portable 1 View  Result Date: 05/28/2021 CLINICAL DATA:  Shortness of breath, chest pain EXAM: PORTABLE CHEST 1 VIEW COMPARISON:  05/01/2021 FINDINGS: Lungs are clear.  No pleural effusion or pneumothorax. The heart is normal in size.  Left subclavian ICD. Mild degenerative changes of the mid thoracic spine. IMPRESSION: No evidence of acute cardiopulmonary disease. Electronically Signed   By: Julian Hy M.D.   On: 05/28/2021 19:04   DG UGI W SINGLE CM (SOL OR THIN BA)  Result Date: 05/30/2021 CLINICAL DATA:  Upper GI bleed. Evaluate for mucosal ulceration/defect. EXAM: DG UGI W SINGLE CM TECHNIQUE: Scout radiograph was obtained. Single contrast examination was performed using thin liquid barium. This exam was performed by Candiss Norse, PA, and was supervised and interpreted by myself. FLUOROSCOPY TIME:  Radiation Exposure Index (as provided by the fluoroscopic device): 44.4 mGy  COMPARISON:  Chest CT 05/27/2021.  Abdominopelvic CT 04/04/2021. FINDINGS: Scout Radiograph: Due to reported contrast allergy, the study was performed with thin barium after obtaining supine and erect views of the abdomen. The bowel gas pattern is nonobstructive. There is no evidence of pneumoperitoneum. Extensive postsurgical changes are noted, related to previous aortoiliac stent grafting. The patient has an AICD. Study was performed in the supine and semi erect positions. Patient was unable to stand or lie prone. The patient  swallowed the barium without difficulty. The esophageal motility appears within normal limits. There is no evidence of stricture, mass or ulceration. Mild gastric fold thickening is noted which could indicate gastritis. No evidence of mucosal ulceration. The duodenum appears normal. IMPRESSION: 1. No specific cause for upper GI bleeding identified. There is no evidence of mucosal ulceration. 2. Gastric fold thickening which could indicate gastritis. 3. No significant esophageal abnormality. Electronically Signed   By: Richardean Sale M.D.   On: 05/30/2021 13:12   ECHOCARDIOGRAM COMPLETE  Result Date: 05/29/2021    ECHOCARDIOGRAM REPORT   Patient Name:   Peter [name] Date of Exam: 05/29/2021 Medical Rec #:  [id number]        Height:       70.0 in Accession #:    [id number]       Weight:       204.0 lb Date of Birth:  [birth year]       BSA:          2.105 m Patient Age:    67 years         BP:           166/66 mmHg Patient Gender: M                HR:           62 bpm. Exam Location:  [facility] Procedure: 2D Echo, Color Doppler, Cardiac Doppler and Intracardiac            Opacification Agent Indications:     R07.9 Chest Pain  History:         Patient has prior history of Echocardiogram examinations, most                  recent 04/04/2021. CHF, CAD, Pacemaker; Risk Factors:HCL.  Sonographer:     Charmayne Sheer Referring Phys:  [place] Diagnosing Phys: Serafina Royals MD  Sonographer  Comments: Technically difficult study due to poor echo windows. Image acquisition challenging due to respiratory motion. IMPRESSIONS  1. Left ventricular ejection fraction, by estimation, is 40 to 45%. The left ventricle has mildly decreased function. The left ventricle demonstrates global hypokinesis. The left ventricular internal cavity size was mildly dilated. Left ventricular diastolic parameters were normal.  2. Right ventricular systolic function is normal. The right ventricular size is normal.  3. Left atrial size was mildly dilated.  4. Right atrial size was mildly dilated.  5. The mitral valve is normal in structure. Mild to moderate mitral valve regurgitation.  6. Tricuspid valve regurgitation is mild to moderate.  7. The aortic valve is normal in structure. Aortic valve regurgitation is not visualized. FINDINGS  Left Ventricle: Left ventricular ejection fraction, by estimation, is 40 to 45%. The left ventricle has mildly decreased function. The left ventricle demonstrates global hypokinesis. Definity contrast agent was given IV to delineate the left ventricular  endocardial borders. The left ventricular internal cavity size was mildly dilated. There is no left ventricular hypertrophy. Left ventricular diastolic parameters were normal. Right Ventricle: The right ventricular size is normal. No increase in right ventricular wall thickness. Right ventricular systolic function is normal. Left Atrium: Left atrial size was mildly dilated. Right Atrium: Right atrial size was mildly dilated. Pericardium: There is no evidence of pericardial effusion. Mitral Valve: The mitral valve is normal in structure. Mild to moderate mitral valve regurgitation. MV peak gradient, 6.6 mmHg. The mean mitral valve gradient is 4.0 mmHg. Tricuspid Valve: The tricuspid valve is  normal in structure. Tricuspid valve regurgitation is mild to moderate. Aortic Valve: The aortic valve is normal in structure. Aortic valve regurgitation is  not visualized. Aortic valve mean gradient measures 11.0 mmHg. Aortic valve peak gradient measures 19.0 mmHg. Aortic valve area, by VTI measures 2.27 cm. Pulmonic Valve: The pulmonic valve was normal in structure. Pulmonic valve regurgitation is trivial. Aorta: The aortic root and ascending aorta are structurally normal, with no evidence of dilitation. IAS/Shunts: No atrial level shunt detected by color flow Doppler.  LEFT VENTRICLE PLAX 2D LVIDd:         5.23 cm   Diastology LVIDs:         4.07 cm   LV e' medial:    5.33 cm/s LV PW:         1.39 cm   LV E/e' medial:  18.0 LV IVS:        1.11 cm   LV e' lateral:   5.66 cm/s LVOT diam:     2.50 cm   LV E/e' lateral: 16.9 LV SV:         116 LV SV Index:   55 LVOT Area:     4.91 cm  RIGHT VENTRICLE RV Basal diam:  4.45 cm LEFT ATRIUM           Index        RIGHT ATRIUM           Index LA diam:      3.70 cm 1.76 cm/m   RA Area:     17.10 cm LA Vol (A4C): 73.3 ml 34.82 ml/m  RA Volume:   40.00 ml  19.00 ml/m  AORTIC VALVE                     PULMONIC VALVE AV Area (Vmax):    2.61 cm      PV Vmax:       0.87 m/s AV Area (Vmean):   2.33 cm      PV Vmean:      57.000 cm/s AV Area (VTI):     2.27 cm      PV VTI:        0.157 m AV Vmax:           218.00 cm/s   PV Peak grad:  3.0 mmHg AV Vmean:          160.000 cm/s  PV Mean grad:  2.0 mmHg AV VTI:            0.513 m AV Peak Grad:      19.0 mmHg AV Mean Grad:      11.0 mmHg LVOT Vmax:         116.00 cm/s LVOT Vmean:        75.800 cm/s LVOT VTI:          0.237 m LVOT/AV VTI ratio: 0.46  AORTA Ao Root diam: 3.40 cm MITRAL VALVE                TRICUSPID VALVE MV Area (PHT): 3.44 cm     TR Peak grad:   43.6 mmHg MV Area VTI:   3.32 cm     TR Vmax:        330.00 cm/s MV Peak grad:  6.6 mmHg MV Mean grad:  4.0 mmHg     SHUNTS MV Vmax:       1.28 m/s     Systemic VTI:  0.24 m MV Vmean:  93.5 cm/s    Systemic Diam: 2.50 cm MV Decel Time: 221 msec MV E velocity: 95.75 cm/s MV A velocity: 106.00 cm/s MV E/A ratio:  0.90  Serafina Royals MD Electronically signed by Serafina Royals MD Signature Date/Time: 05/29/2021/12:28:40 PM    Final      Assessment/Plan 1.  End-stage renal disease requiring hemodialysis:  Patient will begin dialysis therapy without further interruption.  Tunneled dialysis catheter will be placed.  Risks and benefits of been reviewed all questions answered patient agrees to proceed.  Once he has stabilized and is discharged home we will see him in follow-up and begin planning extremity access. 2.  Coronary artery disease:  EKG will be monitored. Nitrates will be used if needed. The patient's oral cardiac medications will be continued. 3.  Hypertension:  Patient will continue medical management; nephrology is following no changes in oral medications. 4. Diabetes mellitus:  Glucose will be monitored and oral medications been held this morning once the patient has undergone the patient's procedure po intake will be reinitiated and again Accu-Cheks will be used to assess the blood glucose level and treat as needed. The patient will be restarted on the patient's usual hypoglycemic regime 5.  Asthma/COPD: Continue pulmonary medications and aerosols as already ordered, these medications have been reviewed and there are no changes at this time.       Hortencia Pilar, MD  06/17/2021 2:21 PM

## 2021-06-17 NOTE — Progress Notes (Signed)
[facility] Cardiology ? ?The patient is an 81 year old male with a past medical history significant for HFrEF ( 05/29/21 LVEF 40-45%), ischemic cardiomyopathy s/p CRT-D 09/2017, CAD s/p PCI x3 07/2016, history of thoracic aortic aneurysm s/p fenestrated repair 2015, hypertension, hypercholesterolemia, CKD 4, COPD with 3 hospital admissions for symptomatic anemia and acute on chronic CHF since 03/2021 who presented to [facility] ED 05/29/2021 with shortness of breath.  Cardiology is consulted for further assistance and management.  ? ?Interval History:  ?-laying flat on his left side with [employer] around his neck, sleeping comfortably.  ?-admits to a cough, started on guaifenesin. No chest pain. Denies SOB.   ?-renal function continues to worsen with Cr 5.76 and GFR 9 today ? ? ?Vitals:  ? 06/16/21 2323 06/17/21 0354 06/17/21 0828 06/17/21 1128  ?BP: (!) 143/62 (!) 147/64 (!) 142/60 (!) 125/48  ?Pulse: 61 66 70 (!) 103  ?Resp: 18  20   ?Temp: (!) 97.5 ?F (36.4 ?C)  (!) 97.4 ?F (36.3 ?C) 98.1 ?F (36.7 ?C)  ?TempSrc:      ?SpO2: 98% 94% 94% 100%  ?Weight: 90.3 kg     ?Height:      ? ? ? ?Intake/Output Summary (Last 24 hours) at 06/17/2021 1226 ?Last data filed at 06/17/2021 1028 ?Gross per 24 hour  ?Intake 240 ml  ?Output 600 ml  ?Net -360 ml  ? ? ? ? ? ?PHYSICAL EXAM ?General: Elderly appearing Caucasian male, well nourished, in no acute distress. Laying flat in PCU bed on his left side, sleeping.  ?HEENT:  Normocephalic and atraumatic. ?Neck:   No JVD.  ?Lungs: Normal respiratory effort with [place] around his neck. Clear bilaterally to auscultation. No wheezes, crackles, rhonchi.  ?Heart: Regular rate with frequent extra beats. Normal S1 and S2 without gallops or murmurs. Radial & DP pulses 2+ bilaterally. ?Abdomen: Obese appearing.  ?Msk: Normal strength and tone for age. ?Extremities: Warm and well perfused. No clubbing, cyanosis.  No peripheral edema ?Neuro: Alert and oriented X 3. ?Psych:  Answers questions appropriately.  ? ? ?LABS: ?Basic  Metabolic Panel: ?Recent Labs  ?  06/16/21 ?0414 06/17/21 ?0616  ?NA 138 135  ?K 4.1 4.0  ?CL 106 105  ?CO2 21* 19*  ?GLUCOSE 114* 99  ?BUN 84* 88*  ?CREATININE 5.35* 5.76*  ?CALCIUM 8.1* 8.0*  ?PHOS 4.5  --   ? ? ?Liver Function Tests: ?Recent Labs  ?  06/16/21 ?0414  ?ALBUMIN 2.5*  ? ? ?No results for input(s): LIPASE, AMYLASE in the last 72 hours. ?CBC: ?Recent Labs  ?  06/15/21 ?3354  ?WBC 7.9  ?NEUTROABS 5.7  ?HGB 8.6*  ?HCT 26.4*  ?MCV 88.9  ?PLT 121*  ? ? ?Cardiac Enzymes: ?No results for input(s): CKTOTAL, CKMB, CKMBINDEX, TROPONINI in the last 72 hours. ?BNP: ?Invalid input(s): POCBNP ?D-Dimer: ?No results for input(s): DDIMER in the last 72 hours. ?Hemoglobin A1C: ?No results for input(s): HGBA1C in the last 72 hours. ?Fasting Lipid Panel: ?No results for input(s): CHOL, HDL, LDLCALC, TRIG, CHOLHDL, LDLDIRECT in the last 72 hours. ?Thyroid Function Tests: ?No results for input(s): TSH, T4TOTAL, T3FREE, THYROIDAB in the last 72 hours. ? ?Invalid input(s): FREET3 ?Anemia Panel: ?No results for input(s): VITAMINB12, FOLATE, FERRITIN, TIBC, IRON, RETICCTPCT in the last 72 hours. ? ?No results found. ? ? ?Echo LVEF 40 to 45% 05/29/2021 ? ?TELEMETRY: Predominantly A sense V pace ? ?ASSESSMENT AND PLAN: ? ?Principal Problem: ?  Acute on chronic systolic CHF (congestive heart failure) ([place]) ?Active Problems: ?  CAD (coronary artery disease) ?  HTN (hypertension) ?  Hyperlipidemia ?  BPH (benign prostatic hyperplasia) ?  NSTEMI (non-ST elevated myocardial infarction) ([place]) ?  AKI (acute kidney injury) ([place]) ?  Normocytic anemia ?  HLD (hyperlipidemia) ?  CKD (chronic kidney disease), stage IV ([place]) ?  Chronic respiratory failure with hypoxia (HCC) ?  ? ?1. Acute on chronic systolic congestive heart failure, HFrEF, LVEF 40 to 45%, status post CRT-D 05/2017, appears euvolemic ?2.  CAD, status post PCI x3 07/2016, currently chest pain-free ?3.  Elevated troponin, peak 3324, downtrending, in the absence of chest pain, in  the setting of acute on chronic systolic congestive heart failure and acute kidney injury, currently not a candidate for cardiac catheterization ?4.  AKI / CKD stage IV, worsening renal function, creatinine 84 and on admission 3.78 and now 5.76 respectively, seen by nephrology, felt to be due to cardiorenal syndrome, no acute need for dialysis but considering in 1-2 days.  ?5.  COPD, chronically on 2 L at home ?6.  Anemia / GI bleed, with black tarry stool, EGD 05/30/2021 revealed gastritis, plavix on hold ?  ?Recommendations ?  ?1.  Agree with current therapy ?2.  Continue carvedilol ?3.  Hold ACE/ARB ?4.  Defer further dose anticoagulation ?5.  Continue isosorbide mononitrate ?6.  Hold diuretics ?7.  Continue to closely monitor renal status, appreciate nephrology input ?8.  Defer further cardiac diagnostics, including cardiac catheterization, in the absence of chest pain, in the setting of acute kidney injury, and recent history of anemia with GI bleed ? ?This patient's plan of care was discussed and created with Dr. Donnelly Angelica and he is in agreement.   ? ?Tristan Schroeder, PA-C ?06/17/2021 ?12:26 PM ? ? ? ?  ?

## 2021-06-18 ENCOUNTER — Encounter: Payer: Self-pay | Admitting: Vascular Surgery

## 2021-06-18 DIAGNOSIS — I5023 Acute on chronic systolic (congestive) heart failure: Secondary | ICD-10-CM | POA: Diagnosis not present

## 2021-06-18 DIAGNOSIS — D696 Thrombocytopenia, unspecified: Secondary | ICD-10-CM

## 2021-06-18 DIAGNOSIS — N184 Chronic kidney disease, stage 4 (severe): Secondary | ICD-10-CM | POA: Diagnosis not present

## 2021-06-18 LAB — BASIC METABOLIC PANEL
Anion gap: 8 (ref 5–15)
BUN: 95 mg/dL — ABNORMAL HIGH (ref 8–23)
CO2: 21 mmol/L — ABNORMAL LOW (ref 22–32)
Calcium: 8 mg/dL — ABNORMAL LOW (ref 8.9–10.3)
Chloride: 108 mmol/L (ref 98–111)
Creatinine, Ser: 5.88 mg/dL — ABNORMAL HIGH (ref 0.61–1.24)
GFR, Estimated: 9 mL/min — ABNORMAL LOW (ref >60)
Glucose, Bld: 107 mg/dL — ABNORMAL HIGH (ref 70–99)
Potassium: 4.3 mmol/L (ref 3.5–5.1)
Sodium: 137 mmol/L (ref 135–145)

## 2021-06-18 MED ORDER — HEPARIN SODIUM (PORCINE) 1000 UNIT/ML IJ SOLN
INTRAMUSCULAR | Status: AC
  Administered 2021-06-18: 3800 [IU]
  Filled 2021-06-18: qty 10

## 2021-06-18 NOTE — Progress Notes (Signed)
Physical Therapy Treatment ?Patient Details ?Name: Peter [name] ?MRN: [id number] ?DOB: September 20, 1940 ?Today's Date: 06/18/2021 ? ? ?History of Present Illness 81 y.o. 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 Patient was recently hospitalized from 2/15 - 2/22 due to chest pain and non-STEMI. ? ?  ?PT Comments  ? ? Pt out of room for dialysis this AM (apparently has 2 more days of HD before he can d/c) but open to doing some activity this afternoon.  He did well with ambulation and was able to do so w/o AD (first100+ ft with but able to transition to no UE support with ease) and was able to maintain O2 in the mid 90s on room air most of the time. Overall pt showed good confidence with prolonged bout of ambulation with consistent and confidence cadence requiring only minimal gait training cues. PT with no LOBs or overt safety issues.  Good effort.    ?Recommendations for follow up therapy are one component of a multi-disciplinary discharge planning process, led by the attending physician.  Recommendations may be updated based on patient status, additional functional criteria and insurance authorization. ? ?Follow Up Recommendations ? Home health PT ?  ?  ?Assistance Recommended at Discharge PRN  ?Patient can return home with the following   ?  ?Equipment Recommendations ? None recommended by PT  ?  ?Recommendations for Other Services   ? ? ?  ?Precautions / Restrictions Precautions ?Precautions: Fall ?Restrictions ?Weight Bearing Restrictions: No  ?  ? ?Mobility ? Bed Mobility ?Overal bed mobility: Modified Independent ?  ?  ?  ?  ?  ?  ?  ?  ? ?Transfers ?Overall transfer level: Modified independent ?Equipment used: Rolling walker (2 wheels) ?Transfers:  Sit to/from Stand ?Sit to Stand: Modified independent (Device/Increase time) ?  ?  ?  ?  ?  ?General transfer comment: Able to rise to standing with relative ease, minimal reliance on UEs to push or on walker once upright ?  ? ?Ambulation/Gait ?Ambulation/Gait assistance: Min guard ?Gait Distance (Feet): 350 Feet ?Assistive device: Rolling walker (2 wheels), None ?  ?  ?  ?  ?General Gait Details: ~125 ft with AD and 225 w/o AD.  Pt able to maintain consistent cadence, community appropriate speed and per family does not look to far from his baseline.  Pt initially on 4L with sats int he high 90s, dropped to 3L with continued good sats and did final 200 ft on 2L with sats remaining in the 93-95% range.  Pt with some subjective fatigue and mild shortness of breath but ultimately did well. ? ? ?Stairs ?  ?  ?  ?  ?  ? ? ?Wheelchair Mobility ?  ? ?Modified Rankin (Stroke Patients Only) ?  ? ? ?  ?Balance Overall balance assessment: Needs assistance ?Sitting-balance support: No upper extremity supported, Feet supported ?Sitting balance-Leahy Scale: Good ?  ?  ?Standing balance support: No upper extremity supported ?Standing balance-Leahy Scale: Good ?Standing balance comment: rare reaching to rail/wall but pt able to maintain balance with no LOBs or overt safety issues with prolonged ambulation and functional standing tasks ?  ?  ?  ?  ?  ?  ?  ?  ?  ?  ?  ?  ? ?  ?  Cognition Arousal/Alertness: Awake/alert ?Behavior During Therapy: [facility] for tasks assessed/performed ?Overall Cognitive Status: Within Functional Limits for tasks assessed ?  ?  ?  ?  ?  ?  ?  ?  ?  ?  ?  ?  ?  ?  ?  ?  ?  ?  ?  ? ?  ?Exercises   ? ?  ?General Comments   ?  ?  ? ?Pertinent Vitals/Pain Pain Assessment ?Pain Assessment: No/denies pain  ? ? ?Home Living   ?  ?  ?  ?  ?  ?  ?  ?  ?  ?   ?  ?Prior Function    ?  ?  ?   ? ?PT Goals (current goals can now be found in the care plan section) Progress towards PT goals: Progressing toward goals ? ?   ?Frequency ? ? ? Min 2X/week ? ? ? ?  ?PT Plan Current plan remains appropriate  ? ? ?Co-evaluation   ?  ?  ?  ?  ? ?  ?AM-PAC PT "6 Clicks" Mobility   ?Outcome Measure ? Help needed turning from your back to your side while in a flat bed without using bedrails?: None ?Help needed moving from lying on your back to sitting on the side of a flat bed without using bedrails?: None ?Help needed moving to and from a bed to a chair (including a wheelchair)?: None ?Help needed standing up from a chair using your arms (e.g., wheelchair or bedside chair)?: None ?Help needed to walk in hospital room?: A Little ?Help needed climbing 3-5 steps with a railing? : A Little ?6 Click Score: 22 ? ?  ?End of Session Equipment Utilized During Treatment: Oxygen;Gait belt ?Activity Tolerance: Patient tolerated treatment well;Patient limited by fatigue ?Patient left: in chair;with call bell/phone within reach;with nursing/sitter in room;with family/visitor present ?Nurse Communication: Mobility status ?PT Visit Diagnosis: Muscle weakness (generalized) (M62.81);Difficulty in walking, not elsewhere classified (R26.2) ?  ? ? ?Time: 3329-5188 ?PT Time Calculation (min) (ACUTE ONLY): 18 min ? ?Charges:  $Gait Training: 8-22 mins          ?          ? ?Kreg Shropshire, DPT ?06/18/2021, 5:45 PM ? ?

## 2021-06-18 NOTE — Progress Notes (Signed)
[facility] Cardiology ? ?The patient is an 81 year old male with a past medical history significant for HFrEF ( 05/29/21 LVEF 40-45%), ischemic cardiomyopathy s/p CRT-D 09/2017, CAD s/p PCI x3 07/2016, history of thoracic aortic aneurysm s/p fenestrated repair 2015, hypertension, hypercholesterolemia, CKD 4, COPD with 3 hospital admissions for symptomatic anemia and acute on chronic CHF since 03/2021 who presented to [facility] ED 05/29/2021 with shortness of breath.  Cardiology is consulted for further assistance and management.  ? ?Interval History:  ?-had permacath placement yesterday and first dialysis this morning that he tolerated well  ?-oriented x 3 but his friend at bedside states he has been rather confused with visual hallucinations overnight, this morning he tells he is "disappointed in the program" but drifts to sleep shortly after. ? ? ?Vitals:  ? 06/18/21 1043 06/18/21 1045 06/18/21 1100 06/18/21 1129  ?BP: (!) 150/59 (!) 150/59 (!) 145/67 (!) 152/57  ?Pulse: 65 74 64 64  ?Resp: (!) '21 18 19   '$ ?Temp: (!) 97.5 ?F (36.4 ?C)   (!) 97.4 ?F (36.3 ?C)  ?TempSrc: Oral   Oral  ?SpO2: 97% 98% 96% 99%  ?Weight:  91.5 kg    ?Height:      ? ? ? ?Intake/Output Summary (Last 24 hours) at 06/18/2021 1139 ?Last data filed at 06/18/2021 1040 ?Gross per 24 hour  ?Intake 240 ml  ?Output 400 ml  ?Net -160 ml  ? ? ? ? ? ?PHYSICAL EXAM ?General: Elderly appearing Caucasian male, well nourished, in no acute distress. Laying on his back in PCU bed, male friend at bedside ?HEENT:  Normocephalic and atraumatic. ?Neck:   No JVD.  ?Lungs: Normal respiratory effort on O2 by [place]. Clear bilaterally to auscultation. No wheezes, crackles, rhonchi.  ?Chest: Right upper chest with permacath, scattered ecchymosis ?Heart: Regular rate with frequent extra beats. Normal S1 and S2 without gallops or murmurs. Radial & DP pulses 2+ bilaterally. ?Abdomen: Obese appearing.  ?Msk: Normal strength and tone for age. ?Extremities: Warm and well perfused. No clubbing,  cyanosis.  trace bilateral LE edema ?Neuro: Alert and oriented X 3. ?Psych:  Answers questions appropriately.  ? ? ?LABS: ?Basic Metabolic Panel: ?Recent Labs  ?  06/16/21 ?0414 06/17/21 ?0616 06/18/21 ?2620  ?NA 138 135 137  ?K 4.1 4.0 4.3  ?CL 106 105 108  ?CO2 21* 19* 21*  ?GLUCOSE 114* 99 107*  ?BUN 84* 88* 95*  ?CREATININE 5.35* 5.76* 5.88*  ?CALCIUM 8.1* 8.0* 8.0*  ?PHOS 4.5  --   --   ? ? ?Liver Function Tests: ?Recent Labs  ?  06/16/21 ?0414  ?ALBUMIN 2.5*  ? ? ?No results for input(s): LIPASE, AMYLASE in the last 72 hours. ?CBC: ?No results for input(s): WBC, NEUTROABS, HGB, HCT, MCV, PLT in the last 72 hours. ? ?Cardiac Enzymes: ?No results for input(s): CKTOTAL, CKMB, CKMBINDEX, TROPONINI in the last 72 hours. ?BNP: ?Invalid input(s): POCBNP ?D-Dimer: ?No results for input(s): DDIMER in the last 72 hours. ?Hemoglobin A1C: ?No results for input(s): HGBA1C in the last 72 hours. ?Fasting Lipid Panel: ?No results for input(s): CHOL, HDL, LDLCALC, TRIG, CHOLHDL, LDLDIRECT in the last 72 hours. ?Thyroid Function Tests: ?No results for input(s): TSH, T4TOTAL, T3FREE, THYROIDAB in the last 72 hours. ? ?Invalid input(s): FREET3 ?Anemia Panel: ?No results for input(s): VITAMINB12, FOLATE, FERRITIN, TIBC, IRON, RETICCTPCT in the last 72 hours. ? ?PERIPHERAL VASCULAR CATHETERIZATION ? ?Result Date: 06/17/2021 ?See surgical note for result.  ? ? ?Echo LVEF 40 to 45% 05/29/2021 ? ?TELEMETRY: Predominantly A  sense V pace ? ?ASSESSMENT AND PLAN: ? ?Principal Problem: ?  Acute on chronic systolic CHF (congestive heart failure) ([place]) ?Active Problems: ?  CAD (coronary artery disease) ?  HTN (hypertension) ?  Hyperlipidemia ?  BPH (benign prostatic hyperplasia) ?  NSTEMI (non-ST elevated myocardial infarction) ([place]) ?  AKI (acute kidney injury) ([place]) ?  Normocytic anemia ?  HLD (hyperlipidemia) ?  CKD (chronic kidney disease), stage IV ([place]) ?  Chronic respiratory failure with hypoxia (HCC) ?  ? ?1. Acute on chronic systolic  congestive heart failure, HFrEF, LVEF 40 to 45%, status post CRT-D 05/2017, appears euvolemic ?2.  CAD, status post PCI x3 07/2016, currently chest pain-free,  ?3.  Elevated troponin, peak 3324, downtrending, in the absence of chest pain, in the setting of acute on chronic systolic congestive heart failure and acute kidney injury, currently not a candidate for cardiac catheterization ?4.  ESRD, creatinine 84 and on admission 3.78 and now 5.88 respectively, seen by nephrology, felt to be due to cardiorenal syndrome, had permacath placement 3/7 and first dialysis today ?5.  COPD, chronically on 2 L at home ?6.  Anemia / GI bleed, with black tarry stool, EGD 05/30/2021 revealed gastritis, plavix on hold ?  ?Recommendations ?  ?1.  Agree with current therapy ?2.  Continue carvedilol ?3.  Hold ACE/ARB ?4.  Defer further dose anticoagulation. continue aspirin ?5.  Continue isosorbide mononitrate ?6.  Hold diuretics ?7.  Had first dialysis this morning which he tolerated well  ?8.  Defer further cardiac diagnostics, including cardiac catheterization, in the absence of chest pain, in the setting of acute kidney injury, and recent history of anemia with GI bleed ? ?He will need follow-up with his regular cardiologist Dr. Clayborn Bigness 1 to 2 weeks after discharge.  Cardiology signing off. ? ?This patient's plan of care was discussed and created with Dr. Donnelly Angelica and he is in agreement.   ? ?Tristan Schroeder, PA-C ?06/18/2021 ?11:39 AM ? ? ? ?  ?

## 2021-06-18 NOTE — Progress Notes (Signed)
Mobility Specialist - Progress Note ? ? ? 06/18/21 1548  ?Mobility  ?Activity Ambulated with assistance in hallway;Stood at bedside;Dangled on edge of bed  ?Level of Assistance Standby assist, set-up cues, supervision of patient - no hands on  ?Assistive Device Front wheel walker  ?Distance Ambulated (ft) 180 ft  ?Activity Response Tolerated well  ?$Mobility charge 1 Mobility  ? ? ?Pre-mobility: 63 HR, 157/64 BP, 97% SpO2 ?During mobility: 73 HR, BP, 97% SpO2 ? ?Pt supine upon arrival utilizing 3L. Pt sat EOB + STS with ModI----impulsive to stand and ambulated with supervision. No complaints voiced. Returned to bed with needs in reach and family at bedside. ? ?Merrily Brittle ?Mobility Specialist ?06/18/21, 3:51 PM ? ? ? ? ?

## 2021-06-18 NOTE — Plan of Care (Signed)
  Problem: Education: Goal: Ability to demonstrate management of disease process will improve Outcome: Progressing Goal: Ability to verbalize understanding of medication therapies will improve Outcome: Progressing   

## 2021-06-18 NOTE — Progress Notes (Signed)
Hemodialysis notes ? ?Today is patient's first time to have hemodialysis treatment.  ? ?HD treatment completed. Tolerated well, No complications. Pt asymptomatic. Total treatment time:2 hours. No fluid removal.  ?

## 2021-06-18 NOTE — Progress Notes (Signed)
[facility]  ROUNDING NOTE   Subjective:   Peter [name] is a 81 year old male with past medical conditions including CAD, hypertension, asthma, hyperlipidemia, systolic heart failure, GERD, AAA repair, and chronic kidney disease stage IV.  Patient presents to the emergency department with complaints of shortness of breath.  Patient has been admitted for Shortness of breath [R06.02] SOB (shortness of breath) [R06.02] Elevated troponin [R77.8] Acute on chronic systolic CHF (congestive heart failure) (HCC) [I50.23] Congestive heart failure, unspecified HF chronicity, unspecified heart failure type [facility]) [I50.9]  Patient is known to our practice and is followed by Dr. Juleen China.  Patient recently had admission for the same complaint.  We have been consulted to monitor acute kidney injury.  Patient seen resting in bed after initial dialysis treatment Remains confused Tolerated small meals during dialysis. Remains on 3 L nasal cannula Denies pain or discomfort  Objective:  Vital signs in last 24 hours:  Temp:  [97.4 F (36.3 C)-98.7 F (37.1 C)] 97.4 F (36.3 C) (03/08 1129) Pulse Rate:  [29-100] 64 (03/08 1129) Resp:  [12-26] 19 (03/08 1100) BP: (110-156)/(45-79) 152/57 (03/08 1129) SpO2:  [93 %-100 %] 99 % (03/08 1129) Weight:  [90.1 kg-91.5 kg] 91.5 kg (03/08 1045)  Weight change: -0.2 kg Filed Weights   06/18/21 0441 06/18/21 0830 06/18/21 1045  Weight: 90.1 kg 91.5 kg 91.5 kg    Intake/Output: I/O last 3 completed shifts: In: 480 [P.O.:480] Out: 800 [Urine:800]   Intake/Output this shift:  Total I/O In: -  Out: 100 [Urine:100]  Physical Exam: General: NAD, resting quietly  Head: Normocephalic, atraumatic. Moist oral mucosal membranes  Eyes: Anicteric  Lungs:  Clear to auscultation, normal effort, 3L [place]  Heart: Regular rate and rhythm  Abdomen:  Soft, nontender, nondistended  Extremities: Trace peripheral edema.  Neurologic: Alert, oriented to self  and place, moving all four extremities  Skin: No lesions       Basic Metabolic Panel: Recent Labs  Lab 06/14/21 0540 06/15/21 0556 06/16/21 0414 06/17/21 0616 06/18/21 0555  NA 138 137 138 135 137  K 4.0 3.9 4.1 4.0 4.3  CL 106 105 106 105 108  CO2 22 20* 21* 19* 21*  GLUCOSE 105* 118* 114* 99 107*  BUN 85* 84* 84* 88* 95*  CREATININE 4.99* 5.18* 5.35* 5.76* 5.88*  CALCIUM 8.1* 8.1* 8.1* 8.0* 8.0*  PHOS  --   --  4.5  --   --      Liver Function Tests: Recent Labs  Lab 06/16/21 0414  ALBUMIN 2.5*    No results for input(s): LIPASE, AMYLASE in the last 168 hours. No results for input(s): AMMONIA in the last 168 hours.  CBC: Recent Labs  Lab 06/12/21 0806 06/13/21 0532 06/15/21 0556  WBC 13.6* 11.1* 7.9  NEUTROABS 11.3* 9.1* 5.7  HGB 8.8* 8.4* 8.6*  HCT 27.1* 25.8* 26.4*  MCV 89.7 89.9 88.9  PLT 114* 107* 121*     Cardiac Enzymes: No results for input(s): CKTOTAL, CKMB, CKMBINDEX, TROPONINI in the last 168 hours.  BNP: Invalid input(s): POCBNP  CBG: No results for input(s): GLUCAP in the last 168 hours.  Microbiology: Results for orders placed or performed during the hospital encounter of 06/10/21  Resp Panel by RT-PCR (Flu A&B, Covid) Nasopharyngeal Swab     Status: None   Collection Time: 06/10/21  7:00 AM   Specimen: Nasopharyngeal Swab; Nasopharyngeal(NP) swabs in vial transport medium  Result Value Ref Range Status   SARS Coronavirus 2 by RT PCR  NEGATIVE NEGATIVE Final    Comment: (NOTE) SARS-CoV-2 target nucleic acids are NOT DETECTED.  The SARS-CoV-2 RNA is generally detectable in upper respiratory specimens during the acute phase of infection. The lowest concentration of SARS-CoV-2 viral copies this assay can detect is 138 copies/mL. A negative result does not preclude SARS-Cov-2 infection and should not be used as the sole basis for treatment or other patient management decisions. A negative result may occur with  improper specimen  collection/handling, submission of specimen other than nasopharyngeal swab, presence of viral mutation(s) within the areas targeted by this assay, and inadequate number of viral copies(<138 copies/mL). A negative result must be combined with clinical observations, patient history, and epidemiological information. The expected result is Negative.  Fact Sheet for Patients:  EntrepreneurPulse.com.au  Fact Sheet for Healthcare Providers:  IncredibleEmployment.be  This test is no t yet approved or cleared by the [place] FDA and  has been authorized for detection and/or diagnosis of SARS-CoV-2 by FDA under an Emergency Use Authorization (EUA). This EUA will remain  in effect (meaning this test can be used) for the duration of the COVID-19 declaration under Section 564(b)(1) of the Act, 21 U.S.C.section 360bbb-3(b)(1), unless the authorization is terminated  or revoked sooner.       Influenza A by PCR NEGATIVE NEGATIVE Final   Influenza B by PCR NEGATIVE NEGATIVE Final    Comment: (NOTE) The Xpert Xpress SARS-CoV-2/FLU/RSV plus assay is intended as an aid in the diagnosis of influenza from Nasopharyngeal swab specimens and should not be used as a sole basis for treatment. Nasal washings and aspirates are unacceptable for Xpert Xpress SARS-CoV-2/FLU/RSV testing.  Fact Sheet for Patients: EntrepreneurPulse.com.au  Fact Sheet for Healthcare Providers: IncredibleEmployment.be  This test is not yet approved or cleared by the [place] FDA and has been authorized for detection and/or diagnosis of SARS-CoV-2 by FDA under an Emergency Use Authorization (EUA). This EUA will remain in effect (meaning this test can be used) for the duration of the COVID-19 declaration under Section 564(b)(1) of the Act, 21 U.S.C. section 360bbb-3(b)(1), unless the authorization is terminated or revoked.  Performed at [facility], [address] [place], [place] [address]     Coagulation Studies: No results for input(s): LABPROT, INR in the last 72 hours.  Urinalysis: No results for input(s): COLORURINE, LABSPEC, PHURINE, GLUCOSEU, HGBUR, BILIRUBINUR, KETONESUR, PROTEINUR, UROBILINOGEN, NITRITE, LEUKOCYTESUR in the last 72 hours.  Invalid input(s): APPERANCEUR    Imaging: PERIPHERAL VASCULAR CATHETERIZATION  Result Date: 06/17/2021 See surgical note for result.    Medications:       amLODipine  10 mg Oral Daily   aspirin EC  81 mg Oral Daily   atorvastatin  40 mg Oral q1800   calcitRIOL  0.25 mcg Oral Daily   carvedilol  6.25 mg Oral BID WC   Chlorhexidine Gluconate Cloth  6 each Topical Q0600   citalopram  20 mg Oral Daily   finasteride  5 mg Oral Daily   hydrALAZINE  100 mg Oral TID   isosorbide mononitrate  60 mg Oral Daily   pantoprazole  40 mg Oral Daily   sodium chloride flush  3 mL Intravenous Q12H   tamsulosin  0.4 mg Oral Daily   umeclidinium bromide  1 puff Inhalation Daily   cyanocobalamin  1,000 mcg Oral Daily   acetaminophen, albuterol, dextromethorphan-guaiFENesin, hydrALAZINE, loratadine, nitroGLYCERIN, ondansetron (ZOFRAN) IV, sodium chloride  Assessment/ Plan:  Mr. ELIJIO STAPLES is a 81 y.o.  male male with  past medical conditions including CAD, hypertension, asthma, hyperlipidemia, systolic heart failure, GERD, AAA repair, and chronic kidney disease stage IV.  Patient presents to the emergency department with complaints of shortness of breath.  Patient has been admitted for Shortness of breath [R06.02] SOB (shortness of breath) [R06.02] Elevated troponin [R77.8] Acute on chronic systolic CHF (congestive heart failure) (HCC) [I50.23] Congestive heart failure, unspecified HF chronicity, unspecified heart failure type ([place]) [I50.9]   End-stage renal disease requiring hemodialysis.        Renal function has shown no sustainable improvement over the past few  days.  Urinary output has also fluctuated.  With considering current clinical presentation and declining renal function outpatient, we feel patient has progressed to end-stage renal disease requiring dialysis.  Appreciate vascular surgeon placing right chest PermCath yesterday.  Dialysis was initiated today.  Patient tolerated first treatment well.  Patient will return tomorrow for additional treatment.  Dialysis coordinator will be made aware of patient and need for outpatient clinic.  It was discussed with patient prior to initiation that dialysis may be required long-term.  Lab Results  Component Value Date   CREATININE 5.88 (H) 06/18/2021   CREATININE 5.76 (H) 06/17/2021   CREATININE 5.35 (H) 06/16/2021    Intake/Output Summary (Last 24 hours) at 06/18/2021 1249 Last data filed at 06/18/2021 1040 Gross per 24 hour  Intake 240 ml  Output 400 ml  Net -160 ml    2.  Hypertension with chronic kidney disease.  Home regimen includes carvedilol, furosemide, hydralazine, isosorbide, losartan and spironolactone.  Diuretics remain held.  Blood pressure slightly elevated but stable for this patient  3.  Chronic systolic heart failure.Previous echo from prior admission shows EF 40 to 45% with mildly decreased left ventricular function and mild LVH.  Cardiology following and recommend conservative management.  4.Anemia of chronic disease Patient hemoglobin remains low Patient iron saturation when checked on February 15 were within normal limits We will order updated lab work in a.m.    LOS: 8 [employer] 3/8/202312:49 PM

## 2021-06-18 NOTE — Progress Notes (Signed)
PROGRESS NOTE    Peter [name]  YIR:[id number] DOB: [birth year] DOA: 06/10/2021 PCP: Tracie Harrier, MD   Assessment & Plan:   Principal Problem:   Acute on chronic systolic CHF (congestive heart failure) ([place]) Active Problems:   CAD (coronary artery disease)   HTN (hypertension)   Hyperlipidemia   BPH (benign prostatic hyperplasia)   NSTEMI (non-ST elevated myocardial infarction) ([place])   AKI (acute kidney injury) ([place])   Normocytic anemia   HLD (hyperlipidemia)   CKD (chronic kidney disease), stage IV (HCC)   Chronic respiratory failure with hypoxia (HCC)   Acute on chronic systolic CHF: compensated. Continue to hold lasix. Monitor I/Os  Elevated troponins: likely secondary to demand ischemia as per cardio  AKI on CKDIV: S/p permacath placement by vasc surg. Will start HD today as per nephro  Chronic hypoxic respiratory failure: continue on supplemental oxygen  Leukocytosis: resolved  HTN: continue on amlodipine, coreg, imdur  HLD: continue on statin  BPH: continue on home dose of finasteride   Depression: severity unknown. Continue on home dose of citalopram   Thrombocytopenia: chronic. Will continue to monitor  ACD: likely secondary to CKD. No need for a transfusion currently   DVT prophylaxis: SCDs Code Status:  full  Family Communication: called pt's daughter, [name], and answered her questions  Disposition Plan: depends on PT/OT recs   Level of care: Med-Surg  Status is: Inpatient Remains inpatient appropriate because: starting HD today as per nephro     Consultants:  Nephro Vasc surg   Procedures:   Antimicrobials:    Subjective: Pt c/o fatigue   Objective: Vitals:   06/17/21 2356 06/18/21 0302 06/18/21 0441 06/18/21 0725  BP: (!) 116/53 (!) 123/57  (!) 141/58  Pulse: (!) 56 (!) 56  65  Resp: 18 18    Temp: 97.7 F (36.5 C) 97.7 F (36.5 C)  98 F (36.7 C)  TempSrc: Oral Oral    SpO2: 100% 98%  97%  Weight:   90.1 kg    Height:        Intake/Output Summary (Last 24 hours) at 06/18/2021 0740 Last data filed at 06/18/2021 0524 Gross per 24 hour  Intake 480 ml  Output 300 ml  Net 180 ml   Filed Weights   06/16/21 0336 06/16/21 2323 06/18/21 0441  Weight: 94 kg 90.3 kg 90.1 kg    Examination:  General exam: Appears calm and comfortable  Respiratory system: diminished breath sounds b/l Cardiovascular system: S1 & S2 +. No rubs, gallops or clicks.  Gastrointestinal system: Abdomen is nondistended, soft and nontender. Normal bowel sounds heard. Central nervous system: Alert and oriented. Moves all extremities  Psychiatry: Judgement and insight appear normal. Flat mood and affect.     Data Reviewed: I have personally reviewed following labs and imaging studies  CBC: Recent Labs  Lab 06/12/21 0806 06/13/21 0532 06/15/21 0556  WBC 13.6* 11.1* 7.9  NEUTROABS 11.3* 9.1* 5.7  HGB 8.8* 8.4* 8.6*  HCT 27.1* 25.8* 26.4*  MCV 89.7 89.9 88.9  PLT 114* 107* 500*   Basic Metabolic Panel: Recent Labs  Lab 06/14/21 0540 06/15/21 0556 06/16/21 0414 06/17/21 0616 06/18/21 0555  NA 138 137 138 135 137  K 4.0 3.9 4.1 4.0 4.3  CL 106 105 106 105 108  CO2 22 20* 21* 19* 21*  GLUCOSE 105* 118* 114* 99 107*  BUN 85* 84* 84* 88* 95*  CREATININE 4.99* 5.18* 5.35* 5.76* 5.88*  CALCIUM 8.1* 8.1* 8.1* 8.0* 8.0*  PHOS  --   --  4.5  --   --    GFR: Estimated Creatinine Clearance: 10.5 mL/min (A) (by C-G formula based on SCr of 5.88 mg/dL (H)). Liver Function Tests: Recent Labs  Lab 06/16/21 0414  ALBUMIN 2.5*   No results for input(s): LIPASE, AMYLASE in the last 168 hours. No results for input(s): AMMONIA in the last 168 hours. Coagulation Profile: No results for input(s): INR, PROTIME in the last 168 hours. Cardiac Enzymes: No results for input(s): CKTOTAL, CKMB, CKMBINDEX, TROPONINI in the last 168 hours. BNP (last 3 results) No results for input(s): PROBNP in the last 8760 hours. HbA1C: No  results for input(s): HGBA1C in the last 72 hours. CBG: No results for input(s): GLUCAP in the last 168 hours. Lipid Profile: No results for input(s): CHOL, HDL, LDLCALC, TRIG, CHOLHDL, LDLDIRECT in the last 72 hours. Thyroid Function Tests: No results for input(s): TSH, T4TOTAL, FREET4, T3FREE, THYROIDAB in the last 72 hours. Anemia Panel: No results for input(s): VITAMINB12, FOLATE, FERRITIN, TIBC, IRON, RETICCTPCT in the last 72 hours. Sepsis Labs: No results for input(s): PROCALCITON, LATICACIDVEN in the last 168 hours.  Recent Results (from the past 240 hour(s))  Resp Panel by RT-PCR (Flu A&B, Covid) Nasopharyngeal Swab     Status: None   Collection Time: 06/10/21  7:00 AM   Specimen: Nasopharyngeal Swab; Nasopharyngeal(NP) swabs in vial transport medium  Result Value Ref Range Status   SARS Coronavirus 2 by RT PCR NEGATIVE NEGATIVE Final    Comment: (NOTE) SARS-CoV-2 target nucleic acids are NOT DETECTED.  The SARS-CoV-2 RNA is generally detectable in upper respiratory specimens during the acute phase of infection. The lowest concentration of SARS-CoV-2 viral copies this assay can detect is 138 copies/mL. A negative result does not preclude SARS-Cov-2 infection and should not be used as the sole basis for treatment or other patient management decisions. A negative result may occur with  improper specimen collection/handling, submission of specimen other than nasopharyngeal swab, presence of viral mutation(s) within the areas targeted by this assay, and inadequate number of viral copies(<138 copies/mL). A negative result must be combined with clinical observations, patient history, and epidemiological information. The expected result is Negative.  Fact Sheet for Patients:  EntrepreneurPulse.com.au  Fact Sheet for Healthcare Providers:  IncredibleEmployment.be  This test is no t yet approved or cleared by the [place] FDA and  has  been authorized for detection and/or diagnosis of SARS-CoV-2 by FDA under an Emergency Use Authorization (EUA). This EUA will remain  in effect (meaning this test can be used) for the duration of the COVID-19 declaration under Section 564(b)(1) of the Act, 21 U.S.C.section 360bbb-3(b)(1), unless the authorization is terminated  or revoked sooner.       Influenza A by PCR NEGATIVE NEGATIVE Final   Influenza B by PCR NEGATIVE NEGATIVE Final    Comment: (NOTE) The Xpert Xpress SARS-CoV-2/FLU/RSV plus assay is intended as an aid in the diagnosis of influenza from Nasopharyngeal swab specimens and should not be used as a sole basis for treatment. Nasal washings and aspirates are unacceptable for Xpert Xpress SARS-CoV-2/FLU/RSV testing.  Fact Sheet for Patients: EntrepreneurPulse.com.au  Fact Sheet for Healthcare Providers: IncredibleEmployment.be  This test is not yet approved or cleared by the [place] FDA and has been authorized for detection and/or diagnosis of SARS-CoV-2 by FDA under an Emergency Use Authorization (EUA). This EUA will remain in effect (meaning this test can be used) for the duration of the COVID-19 declaration under Section 564(b)(1) of the Act, 21 U.S.C.  section 360bbb-3(b)(1), unless the authorization is terminated or revoked.  Performed at [facility], [address] [place], [place] [address]          Radiology Studies: PERIPHERAL VASCULAR CATHETERIZATION  Result Date: 06/17/2021 See surgical note for result.       Scheduled Meds:  amLODipine  10 mg Oral Daily   aspirin EC  81 mg Oral Daily   atorvastatin  40 mg Oral q1800   calcitRIOL  0.25 mcg Oral Daily   carvedilol  6.25 mg Oral BID WC   Chlorhexidine Gluconate Cloth  6 each Topical Q0600   citalopram  20 mg Oral Daily   finasteride  5 mg Oral Daily   hydrALAZINE  100 mg Oral TID   isosorbide mononitrate  60 mg Oral Daily    pantoprazole  40 mg Oral Daily   sodium chloride flush  3 mL Intravenous Q12H   tamsulosin  0.4 mg Oral Daily   umeclidinium bromide  1 puff Inhalation Daily   cyanocobalamin  1,000 mcg Oral Daily   Continuous Infusions:   LOS: 8 days    Time spent: 32 mins     Wyvonnia Dusky, MD [facility] Pager [phone]  If 7PM-7AM, please contact night-coverage 06/18/2021, 7:40 AM

## 2021-06-18 NOTE — Progress Notes (Signed)
Assumed care of pt at 1900. Fatigue but easily arousable. Oriented except for the time of day. Right dialysis cath placed 3/7, site w/ minimal bruising, unchanged from previous shift. Drsg C/D/I. Plan for first dialysis treatment today 3/8. Full assessment per flowsheets. Medication administration per MAR. Family member at bedside and updated on POC w/ verbalized understanding. Call bell within reach. Comfort and safety maintained.  ?

## 2021-06-19 ENCOUNTER — Ambulatory Visit: Payer: PPO | Admitting: Family

## 2021-06-19 DIAGNOSIS — I5023 Acute on chronic systolic (congestive) heart failure: Secondary | ICD-10-CM | POA: Diagnosis not present

## 2021-06-19 DIAGNOSIS — J9611 Chronic respiratory failure with hypoxia: Secondary | ICD-10-CM | POA: Diagnosis not present

## 2021-06-19 DIAGNOSIS — Z992 Dependence on renal dialysis: Secondary | ICD-10-CM | POA: Diagnosis not present

## 2021-06-19 DIAGNOSIS — N186 End stage renal disease: Secondary | ICD-10-CM | POA: Diagnosis not present

## 2021-06-19 LAB — CBC
HCT: 25.1 % — ABNORMAL LOW (ref 39.0–52.0)
Hemoglobin: 8 g/dL — ABNORMAL LOW (ref 13.0–17.0)
MCH: 28.6 pg (ref 26.0–34.0)
MCHC: 31.9 g/dL (ref 30.0–36.0)
MCV: 89.6 fL (ref 80.0–100.0)
Platelets: 171 10*3/uL (ref 150–400)
RBC: 2.8 MIL/uL — ABNORMAL LOW (ref 4.22–5.81)
RDW: 13.4 % (ref 11.5–15.5)
WBC: 7.6 10*3/uL (ref 4.0–10.5)
nRBC: 0 % (ref 0.0–0.2)

## 2021-06-19 LAB — RENAL FUNCTION PANEL
Albumin: 2.6 g/dL — ABNORMAL LOW (ref 3.5–5.0)
Anion gap: 9 (ref 5–15)
BUN: 70 mg/dL — ABNORMAL HIGH (ref 8–23)
CO2: 24 mmol/L (ref 22–32)
Calcium: 8.1 mg/dL — ABNORMAL LOW (ref 8.9–10.3)
Chloride: 106 mmol/L (ref 98–111)
Creatinine, Ser: 4.72 mg/dL — ABNORMAL HIGH (ref 0.61–1.24)
GFR, Estimated: 12 mL/min — ABNORMAL LOW (ref >60)
Glucose, Bld: 108 mg/dL — ABNORMAL HIGH (ref 70–99)
Phosphorus: 4.3 mg/dL (ref 2.5–4.6)
Potassium: 3.8 mmol/L (ref 3.5–5.1)
Sodium: 139 mmol/L (ref 135–145)

## 2021-06-19 MED ORDER — SODIUM CHLORIDE 0.9 % IV SOLN: 100.0000 mL | INTRAVENOUS | Status: DC | PRN

## 2021-06-19 MED ORDER — ALTEPLASE 2 MG IJ SOLR: 2.0000 mg | Freq: Once | INTRAMUSCULAR | Status: DC | PRN

## 2021-06-19 MED ORDER — HEPARIN SODIUM (PORCINE) 1000 UNIT/ML DIALYSIS
1000.0000 [IU] | INTRAMUSCULAR | Status: DC | PRN
  Filled 2021-06-19: qty 1

## 2021-06-19 MED ORDER — LIDOCAINE HCL (PF) 1 % IJ SOLN
5.0000 mL | INTRAMUSCULAR | Status: DC | PRN
  Filled 2021-06-19: qty 5

## 2021-06-19 MED ORDER — HEPARIN SODIUM (PORCINE) 1000 UNIT/ML IJ SOLN
INTRAMUSCULAR | Status: AC
Start: 2021-06-19 — End: 2021-06-19
  Filled 2021-06-19: qty 10

## 2021-06-19 MED ORDER — PENTAFLUOROPROP-TETRAFLUOROETH EX AERO
1.0000 "application " | INHALATION_SPRAY | CUTANEOUS | Status: DC | PRN
  Filled 2021-06-19: qty 30

## 2021-06-19 MED ORDER — LIDOCAINE-PRILOCAINE 2.5-2.5 % EX CREA
1.0000 "application " | TOPICAL_CREAM | CUTANEOUS | Status: DC | PRN
  Filled 2021-06-19: qty 5

## 2021-06-19 NOTE — TOC Progression Note (Signed)
Transition of Care (TOC) - Progression Note  ? ? ?Patient Details  ?Name: Peter [name] ?MRN: [id number] ?Date of Birth: [birth year] ? ?Transition of Care (TOC) CM/SW Contact  ?Candie Chroman, LCSW ?Phone Number: ?06/19/2021, 9:46 AM ? ?Clinical Narrative:   Discussed case with HD coordinator and gave her phone number for [facility] at [facility]. ? ?Expected Discharge Plan: [place] ?Barriers to Discharge: Continued Medical Work up ? ?Expected Discharge Plan and Services ?Expected Discharge Plan: [place] ?  ?  ?Post Acute Care Choice: Resumption of Svcs/PTA Provider ?Living arrangements for the past 2 months: [place] ?                ?  ?  ?  ?  ?  ?HH Arranged: PT ?[place] Agency: [place] ([place]) ?Date HH Agency Contacted: 06/11/21 ?  ?Representative spoke with at [place]: Floydene Flock ? ? ?Social Determinants of Health (SDOH) Interventions ?  ? ?Readmission Risk Interventions ?Readmission Risk Prevention Plan 06/11/2021 04/28/2021  ?Transportation Screening Complete Complete  ?PCP or Specialist Appt within 3-5 Days - Complete  ?[place] or Home Care Consult - Complete  ?Social Work Consult for [place] Planning/Counseling - Complete  ?Palliative Care Screening - Not Applicable  ?Medication Review Press photographer) Complete Complete  ?PCP or Specialist appointment within 3-5 days of discharge Complete -  ?[place] or Home Care Consult Complete -  ?SW Recovery Care/Counseling Consult Complete -  ?Palliative Care Screening Not Applicable -  ?[place] Not Applicable -  ?Some recent data might be hidden  ? ? ?

## 2021-06-19 NOTE — Progress Notes (Signed)
?   06/19/21 0900 06/19/21 0915 06/19/21 0930  ?Vitals  ?BP (!) 144/66 (!) 151/60 (!) 153/78  ?MAP (mmHg) 89 85 97  ?Pulse Rate 62 (!) 29 62  ?ECG Heart Rate 64 (!) 58 64  ?Resp 15 20 (!) 21  ?During Hemodialysis Assessment  ?Blood Flow Rate (mL/min) 250 mL/min 250 mL/min 250 mL/min  ?Arterial Pressure (mmHg) -70 mmHg -70 mmHg -70 mmHg  ?Venous Pressure (mmHg) 80 mmHg 80 mmHg 80 mmHg  ?Transmembrane Pressure (mmHg) 30 mmHg 30 mmHg 30 mmHg  ?Ultrafiltration Rate (mL/min) 400 mL/min 400 mL/min 400 mL/min  ?Dialysate Flow Rate (mL/min) 300 ml/min 300 ml/min 300 ml/min  ?Conductivity: Machine  13.8 13.8 13.8  ?HD Safety Checks Performed Yes Yes Yes  ?Dialysis Fluid Bolus Normal Saline  --   --   ?Bolus Amount (mL) 250 mL  --   --   ?Intra-Hemodialysis Comments Progressing as prescribed;Tx initiated Progressing as prescribed Progressing as prescribed  ? ? 06/19/21 0945 06/19/21 1000 06/19/21 1015  ?Vitals  ?BP (!) 155/66 (!) 155/71 (!) 154/67  ?MAP (mmHg) 91 94 94  ?Pulse Rate (!) 51 (!) 57 66  ?ECG Heart Rate 64 63 66  ?Resp 20 (!) 25 (!) 21  ?During Hemodialysis Assessment  ?Blood Flow Rate (mL/min) 250 mL/min 250 mL/min 250 mL/min  ?Arterial Pressure (mmHg) -70 mmHg -70 mmHg -80 mmHg  ?Venous Pressure (mmHg) 80 mmHg 80 mmHg 80 mmHg  ?Transmembrane Pressure (mmHg) 30 mmHg 30 mmHg 30 mmHg  ?Ultrafiltration Rate (mL/min) 400 mL/min 400 mL/min 400 mL/min  ?Dialysate Flow Rate (mL/min) 300 ml/min 300 ml/min 300 ml/min  ?Conductivity: Machine  13.8 13.8 13.8  ?HD Safety Checks Performed Yes Yes Yes  ?Dialysis Fluid Bolus  --   --   --   ?Bolus Amount (mL)  --   --   --   ?Intra-Hemodialysis Comments Progressing as prescribed Progressing as prescribed Progressing as prescribed  ? ?

## 2021-06-19 NOTE — Progress Notes (Signed)
PROGRESS NOTE    Peter [name]  TKW:[id number] DOB: Aug 20, 1940 DOA: 06/10/2021 PCP: Tracie Harrier, MD   Assessment & Plan:   Principal Problem:   Acute on chronic systolic CHF (congestive heart failure) ([place]) Active Problems:   CAD (coronary artery disease)   HTN (hypertension)   Hyperlipidemia   BPH (benign prostatic hyperplasia)   NSTEMI (non-ST elevated myocardial infarction) ([place])   AKI (acute kidney injury) ([place])   Normocytic anemia   HLD (hyperlipidemia)   CKD (chronic kidney disease), stage IV (HCC)   Chronic respiratory failure with hypoxia (HCC)   Acute on chronic systolic CHF: compensated. Volume management w/ HD. 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 S/p permacath placement by vasc surg. HD as per nephro   Chronic hypoxic respiratory failure: continue on supplemental oxygen   Leukocytosis: resolved  HTN: continue on imdur, coreg & amlodipine   HLD:  continue on statin   BPH: continue on home dose of finasteride   Depression: severity unknown. Continue on home dose of citalopram  Thrombocytopenia: labile and chronic   ACD: likely secondary to CKD. Will transfuse if Hb < 7.0    DVT prophylaxis: SCDs Code Status:  full  Family Communication:  Disposition Plan: d/c home w/ HH   Level of care: Med-Surg  Status is: Inpatient Remains inpatient appropriate because: still needs outpatient HD spot     Consultants:  Nephro Vasc surg   Procedures:   Antimicrobials:    Subjective: Pt c/o malaise   Objective: Vitals:   06/18/21 1934 06/19/21 0002 06/19/21 0003 06/19/21 0434  BP: (!) 109/58 (!) 149/64 (!) 149/64 (!) 177/76  Pulse: (!) 51  60 71  Resp: '18  20 20  '$ Temp: 98.1 F (36.7 C)  97.6 F (36.4 C) (!) 97.5 F (36.4 C)  TempSrc:   Oral Oral  SpO2: 96%  99% 98%  Weight:    94 kg  Height:        Intake/Output Summary (Last  24 hours) at 06/19/2021 0745 Last data filed at 06/18/2021 1359 Gross per 24 hour  Intake 120 ml  Output 100 ml  Net 20 ml   Filed Weights   06/18/21 0830 06/18/21 1045 06/19/21 0434  Weight: 91.5 kg 91.5 kg 94 kg    Examination:  General exam: Appears comfortable  Respiratory system: decreased breath sounds b/l  Cardiovascular system: S1/S2+. No rubs or clicks   Gastrointestinal system: Abd is soft, NT, ND & hypoactive bowel sounds  Central nervous system: Alert and oriented. Moves all extremities  Psychiatry: Judgement and insight appears normal. Flat mood and affect    Data Reviewed: I have personally reviewed following labs and imaging studies  CBC: Recent Labs  Lab 06/12/21 0806 06/13/21 0532 06/15/21 0556 06/19/21 0636  WBC 13.6* 11.1* 7.9 7.6  NEUTROABS 11.3* 9.1* 5.7  --   HGB 8.8* 8.4* 8.6* 8.0*  HCT 27.1* 25.8* 26.4* 25.1*  MCV 89.7 89.9 88.9 89.6  PLT 114* 107* 121* 924   Basic Metabolic Panel: Recent Labs  Lab 06/15/21 0556 06/16/21 0414 06/17/21 0616 06/18/21 0555 06/19/21 0636  NA 137 138 135 137 139  K 3.9 4.1 4.0 4.3 3.8  CL 105 106 105 108 106  CO2 20* 21* 19* 21* 24  GLUCOSE 118* 114* 99 107* 108*  BUN 84* 84* 88* 95* 70*  CREATININE 5.18* 5.35* 5.76* 5.88* 4.72*  CALCIUM 8.1* 8.1* 8.0* 8.0* 8.1*  PHOS  --  4.5  --   --  4.3   GFR: Estimated Creatinine Clearance: 14.4 mL/min (A) (by C-G formula based on SCr of 4.72 mg/dL (H)). Liver Function Tests: Recent Labs  Lab 06/16/21 0414 06/19/21 0636  ALBUMIN 2.5* 2.6*   No results for input(s): LIPASE, AMYLASE in the last 168 hours. No results for input(s): AMMONIA in the last 168 hours. Coagulation Profile: No results for input(s): INR, PROTIME in the last 168 hours. Cardiac Enzymes: No results for input(s): CKTOTAL, CKMB, CKMBINDEX, TROPONINI in the last 168 hours. BNP (last 3 results) No results for input(s): PROBNP in the last 8760 hours. HbA1C: No results for input(s): HGBA1C in  the last 72 hours. CBG: No results for input(s): GLUCAP in the last 168 hours. Lipid Profile: No results for input(s): CHOL, HDL, LDLCALC, TRIG, CHOLHDL, LDLDIRECT in the last 72 hours. Thyroid Function Tests: No results for input(s): TSH, T4TOTAL, FREET4, T3FREE, THYROIDAB in the last 72 hours. Anemia Panel: No results for input(s): VITAMINB12, FOLATE, FERRITIN, TIBC, IRON, RETICCTPCT in the last 72 hours. Sepsis Labs: No results for input(s): PROCALCITON, LATICACIDVEN in the last 168 hours.  Recent Results (from the past 240 hour(s))  Resp Panel by RT-PCR (Flu A&B, Covid) Nasopharyngeal Swab     Status: None   Collection Time: 06/10/21  7:00 AM   Specimen: Nasopharyngeal Swab; Nasopharyngeal(NP) swabs in vial transport medium  Result Value Ref Range Status   SARS Coronavirus 2 by RT PCR NEGATIVE NEGATIVE Final    Comment: (NOTE) SARS-CoV-2 target nucleic acids are NOT DETECTED.  The SARS-CoV-2 RNA is generally detectable in upper respiratory specimens during the acute phase of infection. The lowest concentration of SARS-CoV-2 viral copies this assay can detect is 138 copies/mL. A negative result does not preclude SARS-Cov-2 infection and should not be used as the sole basis for treatment or other patient management decisions. A negative result may occur with  improper specimen collection/handling, submission of specimen other than nasopharyngeal swab, presence of viral mutation(s) within the areas targeted by this assay, and inadequate number of viral copies(<138 copies/mL). A negative result must be combined with clinical observations, patient history, and epidemiological information. The expected result is Negative.  Fact Sheet for Patients:  EntrepreneurPulse.com.au  Fact Sheet for Healthcare Providers:  IncredibleEmployment.be  This test is no t yet approved or cleared by the [place] FDA and  has been authorized for detection  and/or diagnosis of SARS-CoV-2 by FDA under an Emergency Use Authorization (EUA). This EUA will remain  in effect (meaning this test can be used) for the duration of the COVID-19 declaration under Section 564(b)(1) of the Act, 21 U.S.C.section 360bbb-3(b)(1), unless the authorization is terminated  or revoked sooner.       Influenza A by PCR NEGATIVE NEGATIVE Final   Influenza B by PCR NEGATIVE NEGATIVE Final    Comment: (NOTE) The Xpert Xpress SARS-CoV-2/FLU/RSV plus assay is intended as an aid in the diagnosis of influenza from Nasopharyngeal swab specimens and should not be used as a sole basis for treatment. Nasal washings and aspirates are unacceptable for Xpert Xpress SARS-CoV-2/FLU/RSV testing.  Fact Sheet for Patients: EntrepreneurPulse.com.au  Fact Sheet for Healthcare Providers: IncredibleEmployment.be  This test is not yet approved or cleared by the [place] FDA and has been authorized for detection and/or diagnosis of SARS-CoV-2 by FDA under an Emergency Use Authorization (EUA). This EUA will remain in effect (meaning this test can be used)  for the duration of the COVID-19 declaration under Section 564(b)(1) of the Act, 21 U.S.C. section 360bbb-3(b)(1), unless the authorization is terminated or revoked.  Performed at [facility], [address] [place], [place] [address]          Radiology Studies: PERIPHERAL VASCULAR CATHETERIZATION  Result Date: 06/17/2021 See surgical note for result.       Scheduled Meds:  amLODipine  10 mg Oral Daily   aspirin EC  81 mg Oral Daily   atorvastatin  40 mg Oral q1800   calcitRIOL  0.25 mcg Oral Daily   carvedilol  6.25 mg Oral BID WC   Chlorhexidine Gluconate Cloth  6 each Topical Q0600   citalopram  20 mg Oral Daily   finasteride  5 mg Oral Daily   hydrALAZINE  100 mg Oral TID   isosorbide mononitrate  60 mg Oral Daily   pantoprazole  40 mg Oral Daily    sodium chloride flush  3 mL Intravenous Q12H   tamsulosin  0.4 mg Oral Daily   umeclidinium bromide  1 puff Inhalation Daily   cyanocobalamin  1,000 mcg Oral Daily   Continuous Infusions:   LOS: 9 days    Time spent: 25 mins     Wyvonnia Dusky, MD [facility] Pager [phone]  If 7PM-7AM, please contact night-coverage 06/19/2021, 7:45 AM

## 2021-06-19 NOTE — Plan of Care (Signed)
°  Problem: Education: °Goal: Ability to demonstrate management of disease process will improve °Outcome: Progressing °Goal: Ability to verbalize understanding of medication therapies will improve °Outcome: Progressing °Goal: Individualized Educational Video(s) °Outcome: Progressing °  °

## 2021-06-19 NOTE — Progress Notes (Signed)
Following for Outpatient hemodialysis placement. ?

## 2021-06-19 NOTE — Progress Notes (Signed)
Mobility Specialist - Progress Note ? ? ? 06/19/21 1400  ?Mobility  ?Activity Ambulated with assistance in hallway;Dangled on edge of bed;Stood at bedside  ?Level of Assistance Standby assist, set-up cues, supervision of patient - no hands on  ?Assistive Device Front wheel walker  ?Distance Ambulated (ft) 170 ft  ?Activity Response Tolerated well  ?$Mobility charge 1 Mobility  ? ? ?Pre-mobility: 70 HR, BP, 96% SpO2 ?During mobility: 82 HR, BP, 96% SpO2 ? ?Pt supine upon arrival utilizing 3L. Pt sat EOB + STS indep and ambulates 160 ft with supervision --- pt refused second lap d/t being "too tired". Returned to bed with needs in reach and family at bedside. ? ?Merrily Brittle ?Mobility Specialist ?06/19/21, 2:41 PM ? ? ? ? ?

## 2021-06-19 NOTE — Progress Notes (Signed)
[facility]  ROUNDING NOTE   Subjective:   Peter [name] is a 81 year old male with past medical conditions including CAD, hypertension, asthma, hyperlipidemia, systolic heart failure, GERD, AAA repair, and chronic kidney disease stage IV.  Patient presents to the emergency department with complaints of shortness of breath.  Patient has been admitted for Shortness of breath [R06.02] SOB (shortness of breath) [R06.02] Elevated troponin [R77.8] Acute on chronic systolic CHF (congestive heart failure) (HCC) [I50.23] Congestive heart failure, unspecified HF chronicity, unspecified heart failure type [facility]) [I50.9]  Patient is known to our practice and is followed by Dr. Juleen China.  Patient recently had admission for the same complaint.  We have been consulted to monitor acute kidney injury.  Patient seen and evaluated during dialysis   HEMODIALYSIS FLOWSHEET:  Blood Flow Rate (mL/min): 250 mL/min Arterial Pressure (mmHg): -80 mmHg Venous Pressure (mmHg): 80 mmHg Transmembrane Pressure (mmHg): 30 mmHg Ultrafiltration Rate (mL/min): 400 mL/min Dialysate Flow Rate (mL/min): 300 ml/min Conductivity: Machine : 13.8 Conductivity: Machine : 13.8 Dialysis Fluid Bolus: Normal Saline Bolus Amount (mL): 250 mL  Patient feels his appetite has improved today Denies pain and discomfort Denies shortness of breath Alert and oriented to self, time and place  Objective:  Vital signs in last 24 hours:  Temp:  [97.5 F (36.4 C)-98.1 F (36.7 C)] 98 F (36.7 C) (03/09 1130) Pulse Rate:  [29-71] 71 (03/09 1159) Resp:  [14-25] 22 (03/09 1159) BP: (109-177)/(58-84) 165/69 (03/09 1130) SpO2:  [96 %-100 %] 96 % (03/09 1159) Weight:  [93.2 kg-94 kg] 93.2 kg (03/09 1159)  Weight change: 1.4 kg Filed Weights   06/19/21 0434 06/19/21 0859 06/19/21 1159  Weight: 94 kg 93.6 kg 93.2 kg    Intake/Output: I/O last 3 completed shifts: In: 360 [P.O.:360] Out: 650 [Urine:650]    Intake/Output this shift:  Total I/O In: -  Out: 500 [Other:500]  Physical Exam: General: NAD, resting quietly  Head: Normocephalic, atraumatic. Moist oral mucosal membranes  Eyes: Anicteric  Lungs:  Clear to auscultation, normal effort, 3L [place]  Heart: Regular rate and rhythm  Abdomen:  Soft, nontender, nondistended  Extremities: Trace peripheral edema.  Neurologic: Alert, oriented x3, moving all four extremities  Skin: No lesions  Access Right PermCath placed on 0/06/90    Basic Metabolic Panel: Recent Labs  Lab 06/15/21 0556 06/16/21 0414 06/17/21 0616 06/18/21 0555 06/19/21 0636  NA 137 138 135 137 139  K 3.9 4.1 4.0 4.3 3.8  CL 105 106 105 108 106  CO2 20* 21* 19* 21* 24  GLUCOSE 118* 114* 99 107* 108*  BUN 84* 84* 88* 95* 70*  CREATININE 5.18* 5.35* 5.76* 5.88* 4.72*  CALCIUM 8.1* 8.1* 8.0* 8.0* 8.1*  PHOS  --  4.5  --   --  4.3     Liver Function Tests: Recent Labs  Lab 06/16/21 0414 06/19/21 0636  ALBUMIN 2.5* 2.6*    No results for input(s): LIPASE, AMYLASE in the last 168 hours. No results for input(s): AMMONIA in the last 168 hours.  CBC: Recent Labs  Lab 06/13/21 0532 06/15/21 0556 06/19/21 0636  WBC 11.1* 7.9 7.6  NEUTROABS 9.1* 5.7  --   HGB 8.4* 8.6* 8.0*  HCT 25.8* 26.4* 25.1*  MCV 89.9 88.9 89.6  PLT 107* 121* 171     Cardiac Enzymes: No results for input(s): CKTOTAL, CKMB, CKMBINDEX, TROPONINI in the last 168 hours.  BNP: Invalid input(s): POCBNP  CBG: No results for input(s): GLUCAP in the last 168 hours.  Microbiology: Results for orders placed or performed during the hospital encounter of 06/10/21  Resp Panel by RT-PCR (Flu A&B, Covid) Nasopharyngeal Swab     Status: None   Collection Time: 06/10/21  7:00 AM   Specimen: Nasopharyngeal Swab; Nasopharyngeal(NP) swabs in vial transport medium  Result Value Ref Range Status   SARS Coronavirus 2 by RT PCR NEGATIVE NEGATIVE Final    Comment: (NOTE) SARS-CoV-2 target  nucleic acids are NOT DETECTED.  The SARS-CoV-2 RNA is generally detectable in upper respiratory specimens during the acute phase of infection. The lowest concentration of SARS-CoV-2 viral copies this assay can detect is 138 copies/mL. A negative result does not preclude SARS-Cov-2 infection and should not be used as the sole basis for treatment or other patient management decisions. A negative result may occur with  improper specimen collection/handling, submission of specimen other than nasopharyngeal swab, presence of viral mutation(s) within the areas targeted by this assay, and inadequate number of viral copies(<138 copies/mL). A negative result must be combined with clinical observations, patient history, and epidemiological information. The expected result is Negative.  Fact Sheet for Patients:  EntrepreneurPulse.com.au  Fact Sheet for Healthcare Providers:  IncredibleEmployment.be  This test is no t yet approved or cleared by the [place] FDA and  has been authorized for detection and/or diagnosis of SARS-CoV-2 by FDA under an Emergency Use Authorization (EUA). This EUA will remain  in effect (meaning this test can be used) for the duration of the COVID-19 declaration under Section 564(b)(1) of the Act, 21 U.S.C.section 360bbb-3(b)(1), unless the authorization is terminated  or revoked sooner.       Influenza A by PCR NEGATIVE NEGATIVE Final   Influenza B by PCR NEGATIVE NEGATIVE Final    Comment: (NOTE) The Xpert Xpress SARS-CoV-2/FLU/RSV plus assay is intended as an aid in the diagnosis of influenza from Nasopharyngeal swab specimens and should not be used as a sole basis for treatment. Nasal washings and aspirates are unacceptable for Xpert Xpress SARS-CoV-2/FLU/RSV testing.  Fact Sheet for Patients: EntrepreneurPulse.com.au  Fact Sheet for Healthcare  Providers: IncredibleEmployment.be  This test is not yet approved or cleared by the [place] FDA and has been authorized for detection and/or diagnosis of SARS-CoV-2 by FDA under an Emergency Use Authorization (EUA). This EUA will remain in effect (meaning this test can be used) for the duration of the COVID-19 declaration under Section 564(b)(1) of the Act, 21 U.S.C. section 360bbb-3(b)(1), unless the authorization is terminated or revoked.  Performed at [facility], [address] [place], [place] [address]     Coagulation Studies: No results for input(s): LABPROT, INR in the last 72 hours.  Urinalysis: No results for input(s): COLORURINE, LABSPEC, PHURINE, GLUCOSEU, HGBUR, BILIRUBINUR, KETONESUR, PROTEINUR, UROBILINOGEN, NITRITE, LEUKOCYTESUR in the last 72 hours.  Invalid input(s): APPERANCEUR    Imaging: PERIPHERAL VASCULAR CATHETERIZATION  Result Date: 06/17/2021 See surgical note for result.    Medications:       amLODipine  10 mg Oral Daily   aspirin EC  81 mg Oral Daily   atorvastatin  40 mg Oral q1800   calcitRIOL  0.25 mcg Oral Daily   carvedilol  6.25 mg Oral BID WC   Chlorhexidine Gluconate Cloth  6 each Topical Q0600   citalopram  20 mg Oral Daily   finasteride  5 mg Oral Daily   heparin sodium (porcine)       hydrALAZINE  100 mg Oral TID   isosorbide mononitrate  60 mg Oral Daily  pantoprazole  40 mg Oral Daily   sodium chloride flush  3 mL Intravenous Q12H   tamsulosin  0.4 mg Oral Daily   umeclidinium bromide  1 puff Inhalation Daily   cyanocobalamin  1,000 mcg Oral Daily   acetaminophen, albuterol, dextromethorphan-guaiFENesin, hydrALAZINE, loratadine, nitroGLYCERIN, ondansetron (ZOFRAN) IV, sodium chloride  Assessment/ Plan:  Peter [name] is a 81 y.o.  male male with past medical conditions including CAD, hypertension, asthma, hyperlipidemia, systolic heart failure, GERD, AAA repair, and chronic  kidney disease stage IV.  Patient presents to the emergency department with complaints of shortness of breath.  Patient has been admitted for Shortness of breath [R06.02] SOB (shortness of breath) [R06.02] Elevated troponin [R77.8] Acute on chronic systolic CHF (congestive heart failure) (HCC) [I50.23] Congestive heart failure, unspecified HF chronicity, unspecified heart failure type ([place]) [I50.9]   End-stage renal disease requiring hemodialysis.        Renal function has shown no sustainable improvement over the past few days.  Urinary output has also fluctuated.  With considering current clinical presentation and declining renal function outpatient, we feel patient has progressed to end-stage renal disease requiring dialysis.  Appreciate vascular surgeon placing right chest PermCath yesterday.  Dialysis initiated on 06/18/21.    Patient received second treatment today, tolerated well.  UF goal 500 mL achieved.  Next treatment scheduled for tomorrow, preferably seated in chair.  Dialysis coordinator aware of patient and outpatient clinic arrangements in progress.   Lab Results  Component Value Date   CREATININE 4.72 (H) 06/19/2021   CREATININE 5.88 (H) 06/18/2021   CREATININE 5.76 (H) 06/17/2021    Intake/Output Summary (Last 24 hours) at 06/19/2021 1438 Last data filed at 06/19/2021 1130 Gross per 24 hour  Intake --  Output 750 ml  Net -750 ml    2.  Hypertension with chronic kidney disease.  Home regimen includes carvedilol, furosemide, hydralazine, isosorbide, losartan and spironolactone.  Diuretics remain held.  Blood pressure 165/69.  3.  Chronic systolic heart failure.Previous echo from prior admission shows EF 40 to 45% with mildly decreased left ventricular function and mild LVH.  Cardiology following and recommend conservative management.  4.Anemia of chronic disease Patient hemoglobin remains low Patient iron saturation when checked on February 15 were within normal  limits Hemoglobin below target at 8.0.  Will consider EPO with dialysis treatments.    LOS: 9 [place] 3/9/20232:38 PM

## 2021-06-19 NOTE — Progress Notes (Signed)
OT Cancellation Note ? ?Patient Details ?Name: Peter [name] ?MRN: [id number] ?DOB: 07-26-1940 ? ? ?Cancelled Treatment:    Reason Eval/Treat Not Completed: OT screened, no needs identified, will sign off. Order received. Per chart review and conversation with PT, pt is currently functioning at baseline [place], no skilled acute OT needs identified. Will sign off.  ? ?Dessie Coma, M.S. OTR/L  ?06/19/21, 9:52 AM  ?ascom [phone] ? ?

## 2021-06-19 NOTE — Progress Notes (Signed)
Patient completes day 2 of 3, new start to hemodialysis. Patient completes treatment without incident, goals of treatment met. CVC functioned without difficulty, .5 fluid removal. Patient will attend 3rd treatment on 06/20/21 for 3 hrs. Patient offers up no concerns, returned to assigned room.  ?

## 2021-06-19 NOTE — Progress Notes (Signed)
The Daughter- Peter [name]- has requested that we do not wean oxygen- please do not suggest to pt going home without it. He wears oxygen at home. - He becomes concerned when faced with the idea that hell not have it.  ?

## 2021-06-19 NOTE — Progress Notes (Signed)
? 06/18/21 0837 06/18/21 0845 06/18/21 0900  ?Vitals  ?BP  --  (!) 153/68 (!) 151/58  ?MAP (mmHg) 90 89 84  ?Pulse Rate 100 98 61  ?ECG Heart Rate 99 97 62  ?Resp 20  --  13  ?During Hemodialysis Assessment  ?Blood Flow Rate (mL/min) 200 mL/min  --  200 mL/min  ?Arterial Pressure (mmHg) -50 mmHg  --  -50 mmHg  ?Venous Pressure (mmHg) 60 mmHg  --  70 mmHg  ?Transmembrane Pressure (mmHg) 30 mmHg  --  30 mmHg  ?Ultrafiltration Rate (mL/min) 250 mL/min  --  250 mL/min  ?Dialysate Flow Rate (mL/min) 300 ml/min  --  300 ml/min  ?Conductivity: Machine  13.7  --  13.4  ?HD Safety Checks Performed Yes  --  Yes  ?Dialysis Fluid Bolus Normal Saline  --   --   ?Bolus Amount (mL) 250 mL  --   --   ?Intra-Hemodialysis Comments Tx initiated ?(by [facility] PCT)  --  Progressing as prescribed  ? ? 06/18/21 0915 06/18/21 0930 06/18/21 0945  ?Vitals  ?BP (!) 156/66 (!) 147/60 (!) 151/69  ?MAP (mmHg) 91 86 94  ?Pulse Rate 63 60 62  ?ECG Heart Rate 63 60 61  ?Resp 16 17 (!) 24  ?During Hemodialysis Assessment  ?Blood Flow Rate (mL/min) 200 mL/min 200 mL/min 200 mL/min  ?Arterial Pressure (mmHg) -60 mmHg -70 mmHg -70 mmHg  ?Venous Pressure (mmHg) 110 mmHg 70 mmHg 70 mmHg  ?Transmembrane Pressure (mmHg) 30 mmHg 30 mmHg 30 mmHg  ?Ultrafiltration Rate (mL/min) 250 mL/min 250 mL/min 250 mL/min  ?Dialysate Flow Rate (mL/min) 300 ml/min 300 ml/min 300 ml/min  ?Conductivity: Machine  13.4 13.5 13.5  ?HD Safety Checks Performed Yes Yes Yes  ?Dialysis Fluid Bolus  --   --   --   ?Bolus Amount (mL)  --   --   --   ?Intra-Hemodialysis Comments Progressing as prescribed Progressing as prescribed Progressing as prescribed  ? ? 06/18/21 1000 06/18/21 1015 06/18/21 1030  ?Vitals  ?BP (!) 146/61 (!) 145/70 138/74  ?MAP (mmHg) 87 92 86  ?Pulse Rate 64 (!) 29 (!) 48  ?ECG Heart Rate 74 69 69  ?Resp (!) 23 20 (!) 23  ?During Hemodialysis Assessment  ?Blood Flow Rate (mL/min) 200 mL/min 200 mL/min 200 mL/min  ?Arterial Pressure (mmHg) -70 mmHg -70 mmHg -70  mmHg  ?Venous Pressure (mmHg) 70 mmHg 90 mmHg 100 mmHg  ?Transmembrane Pressure (mmHg) 30 mmHg 30 mmHg 30 mmHg  ?Ultrafiltration Rate (mL/min) 250 mL/min 250 mL/min 250 mL/min  ?Dialysate Flow Rate (mL/min) 300 ml/min 300 ml/min 300 ml/min  ?Conductivity: Machine  13.5 13.5 13.5  ?HD Safety Checks Performed Yes Yes Yes  ?Dialysis Fluid Bolus  --   --   --   ?Bolus Amount (mL)  --   --   --   ?Intra-Hemodialysis Comments Progressing as prescribed Progressing as prescribed Progressing as prescribed  ? ? 06/18/21 1040  ?Vitals  ?BP  --   ?MAP (mmHg)  --   ?Pulse Rate  --   ?ECG Heart Rate  --   ?Resp  --   ?During Hemodialysis Assessment  ?Blood Flow Rate (mL/min)  --   ?Arterial Pressure (mmHg)  --   ?Venous Pressure (mmHg)  --   ?Transmembrane Pressure (mmHg)  --   ?Ultrafiltration Rate (mL/min)  --   ?Dialysate Flow Rate (mL/min)  --   ?Conductivity: Machine   --   ?HD Safety Checks Performed  --   ?  Dialysis Fluid Bolus  --   ?Bolus Amount (mL)  --   ?Intra-Hemodialysis Comments Tx completed  ? ?

## 2021-06-19 NOTE — Progress Notes (Signed)
?   06/19/21 1030 06/19/21 1045 06/19/21 1100  ?Vitals  ?Temp  --   --   --   ?Temp Source  --   --   --   ?BP (!) 152/68 (!) 155/78 (!) 169/68  ?MAP (mmHg) 94 102 94  ?BP Location  --   --   --   ?BP Method  --   --   --   ?Patient Position (if appropriate)  --   --   --   ?Pulse Rate 64 62 65  ?ECG Heart Rate 64 63 66  ?Resp '14 19 20  '$ ?During Hemodialysis Assessment  ?Blood Flow Rate (mL/min) 250 mL/min 250 mL/min 250 mL/min  ?Arterial Pressure (mmHg) -80 mmHg -80 mmHg -80 mmHg  ?Venous Pressure (mmHg) 80 mmHg 80 mmHg 80 mmHg  ?Transmembrane Pressure (mmHg) 30 mmHg 30 mmHg 30 mmHg  ?Ultrafiltration Rate (mL/min) 400 mL/min 400 mL/min 400 mL/min  ?Dialysate Flow Rate (mL/min) 300 ml/min 300 ml/min 300 ml/min  ?Conductivity: Machine  13.8 13.8 13.8  ?HD Safety Checks Performed Yes Yes Yes  ?Intra-Hemodialysis Comments Progressing as prescribed Progressing as prescribed Progressing as prescribed  ? ? 06/19/21 1115 06/19/21 1130  ?Vitals  ?Temp  --  98 ?F (36.7 ?C)  ?Temp Source  --  Oral  ?BP (!) 164/66 (!) 165/69  ?MAP (mmHg) 95 96  ?BP Location  --  Right Arm  ?BP Method  --  Automatic  ?Patient Position (if appropriate)  --  Lying  ?Pulse Rate 63 (!) 58  ?ECG Heart Rate 65 66  ?Resp (!) 25 17  ?During Hemodialysis Assessment  ?Blood Flow Rate (mL/min) 250 mL/min  --   ?Arterial Pressure (mmHg) -80 mmHg  --   ?Venous Pressure (mmHg) 80 mmHg  --   ?Transmembrane Pressure (mmHg) 30 mmHg  --   ?Ultrafiltration Rate (mL/min) 400 mL/min  --   ?Dialysate Flow Rate (mL/min) 300 ml/min  --   ?Conductivity: Machine  13.8  --   ?HD Safety Checks Performed Yes  --   ?Intra-Hemodialysis Comments Progressing as prescribed Tx completed;Tolerated well  ? ?

## 2021-06-20 DIAGNOSIS — I5023 Acute on chronic systolic (congestive) heart failure: Secondary | ICD-10-CM | POA: Diagnosis not present

## 2021-06-20 DIAGNOSIS — I10 Essential (primary) hypertension: Secondary | ICD-10-CM | POA: Diagnosis not present

## 2021-06-20 DIAGNOSIS — N186 End stage renal disease: Secondary | ICD-10-CM | POA: Diagnosis not present

## 2021-06-20 DIAGNOSIS — Z992 Dependence on renal dialysis: Secondary | ICD-10-CM | POA: Diagnosis not present

## 2021-06-20 LAB — CBC
HCT: 25 % — ABNORMAL LOW (ref 39.0–52.0)
Hemoglobin: 8.1 g/dL — ABNORMAL LOW (ref 13.0–17.0)
MCH: 28.8 pg (ref 26.0–34.0)
MCHC: 32.4 g/dL (ref 30.0–36.0)
MCV: 89 fL (ref 80.0–100.0)
Platelets: 153 10*3/uL (ref 150–400)
RBC: 2.81 MIL/uL — ABNORMAL LOW (ref 4.22–5.81)
RDW: 13.3 % (ref 11.5–15.5)
WBC: 7.9 10*3/uL (ref 4.0–10.5)
nRBC: 0 % (ref 0.0–0.2)

## 2021-06-20 LAB — HEPATITIS B CORE ANTIBODY, TOTAL: Hep B Core Total Ab: NONREACTIVE

## 2021-06-20 MED ORDER — HEPARIN SODIUM (PORCINE) 1000 UNIT/ML IJ SOLN
INTRAMUSCULAR | Status: AC
  Filled 2021-06-20: qty 10

## 2021-06-20 NOTE — Progress Notes (Signed)
?PROGRESS NOTE ? ? ? ?RY MOODY  EXB:[id number] DOB: 1940/09/12 DOA: 06/10/2021 ?PCP: Tracie Harrier, MD  ? ?Assessment & Plan: ?  ?Principal Problem: ?  Acute on chronic systolic CHF (congestive heart failure) ([place]) ?Active Problems: ?  CAD (coronary artery disease) ?  HTN (hypertension) ?  Hyperlipidemia ?  BPH (benign prostatic hyperplasia) ?  NSTEMI (non-ST elevated myocardial infarction) ([place]) ?  AKI (acute kidney injury) ([place]) ?  Normocytic anemia ?  HLD (hyperlipidemia) ?  CKD (chronic kidney disease), stage IV ([place]) ?  Chronic respiratory failure with hypoxia (HCC) ? ? ?Acute on chronic systolic CHF: compensated. Volume management w/ HD. Monitor I/Os.  ? ?Elevated troponins: likely secondary to demand ischemia as per cardio.  ? ?AKI on CKDIV : which progressed to ESRD as per neprho. S/p permacath placement by vasc surg. HD as per nephro  ? ?Chronic hypoxic respiratory failure: continue on supplemental oxygen  ? ?Leukocytosis: resolved ? ?HTN: continue on coreg, imdur, & amlodipine  ? ?HLD: continue on statin  ? ?BPH: continue on home dose of finasteride  ? ?Depression: severity unknown. Continue on home dose of citalopram  ? ?Thrombocytopenia: labile and chronic  ? ?ACD: likely secondary to CKD. H&H are labile  ? ? ?DVT prophylaxis: SCDs ?Code Status:  full  ?Family Communication: discussed pt's care w/ pt's daughter, [name], and answered her questions  ?Disposition Plan: d/c home w/ HH  ? ?Level of care: Med-Surg ? ?Status is: Inpatient ?Remains inpatient appropriate because: still needs outpatient HD spot  ? ? ? ?Consultants:  ?Nephro ?Vasc surg  ? ?Procedures:  ? ?Antimicrobials:  ? ? ?Subjective: ?Pt c/o fatigue ? ?Objective: ?Vitals:  ? 06/19/21 1637 06/19/21 2017 06/19/21 2348 06/20/21 1027  ?BP: 135/72 (!) 150/65 (!) 153/69 (!) 155/71  ?Pulse: 67 65 70 76  ?Resp: '16 20 20 20  '$ ?Temp: (!) 97.5 ?F (36.4 ?C) 97.9 ?F (36.6 ?C) 98 ?F (36.7 ?C) 98.5 ?F (36.9 ?C)  ?TempSrc:  Oral Oral Oral  ?SpO2: 97%  99% 96% 97%  ?Weight:      ?Height:      ? ? ?Intake/Output Summary (Last 24 hours) at 06/20/2021 0726 ?Last data filed at 06/19/2021 2329 ?Gross per 24 hour  ?Intake 200 ml  ?Output 500 ml  ?Net -300 ml  ? ?Filed Weights  ? 06/19/21 0434 06/19/21 0859 06/19/21 1159  ?Weight: 94 kg 93.6 kg 93.2 kg  ? ? ?Examination: ? ?General exam: Appears calm but uncomfortable  ?Respiratory system: diminished breath sounds b/l  ?Cardiovascular system: S1 & S2+. No rubs or gallops ?Gastrointestinal system: Abd is soft, NT, ND & hypoactive bowel sounds  ?Central nervous system: Alert and oriented. Moves all extremities  ?Psychiatry: Judgement and insight appears normal. Flat mood and affect ? ? ? ?Data Reviewed: I have personally reviewed following labs and imaging studies ? ?CBC: ?Recent Labs  ?Lab 06/15/21 ?2536 06/19/21 ?0636 06/20/21 ?6440  ?WBC 7.9 7.6 7.9  ?NEUTROABS 5.7  --   --   ?HGB 8.6* 8.0* 8.1*  ?HCT 26.4* 25.1* 25.0*  ?MCV 88.9 89.6 89.0  ?PLT 121* 171 153  ? ?Basic Metabolic Panel: ?Recent Labs  ?Lab 06/15/21 ?3474 06/16/21 ?2595 06/17/21 ?6387 06/18/21 ?5643 06/19/21 ?0636  ?NA 137 138 135 137 139  ?K 3.9 4.1 4.0 4.3 3.8  ?CL 105 106 105 108 106  ?CO2 20* 21* 19* 21* 24  ?GLUCOSE 118* 114* 99 107* 108*  ?BUN 84* 84* 88* 95* 70*  ?CREATININE 5.18* 5.35*  5.76* 5.88* 4.72*  ?CALCIUM 8.1* 8.1* 8.0* 8.0* 8.1*  ?PHOS  --  4.5  --   --  4.3  ? ?GFR: ?Estimated Creatinine Clearance: 14.3 mL/min (A) (by C-G formula based on SCr of 4.72 mg/dL (H)). ?Liver Function Tests: ?Recent Labs  ?Lab 06/16/21 ?0414 06/19/21 ?0636  ?ALBUMIN 2.5* 2.6*  ? ?No results for input(s): LIPASE, AMYLASE in the last 168 hours. ?No results for input(s): AMMONIA in the last 168 hours. ?Coagulation Profile: ?No results for input(s): INR, PROTIME in the last 168 hours. ?Cardiac Enzymes: ?No results for input(s): CKTOTAL, CKMB, CKMBINDEX, TROPONINI in the last 168 hours. ?BNP (last 3 results) ?No results for input(s): PROBNP in the last 8760  hours. ?HbA1C: ?No results for input(s): HGBA1C in the last 72 hours. ?CBG: ?No results for input(s): GLUCAP in the last 168 hours. ?Lipid Profile: ?No results for input(s): CHOL, HDL, LDLCALC, TRIG, CHOLHDL, LDLDIRECT in the last 72 hours. ?Thyroid Function Tests: ?No results for input(s): TSH, T4TOTAL, FREET4, T3FREE, THYROIDAB in the last 72 hours. ?Anemia Panel: ?No results for input(s): VITAMINB12, FOLATE, FERRITIN, TIBC, IRON, RETICCTPCT in the last 72 hours. ?Sepsis Labs: ?No results for input(s): PROCALCITON, LATICACIDVEN in the last 168 hours. ? ?No results found for this or any previous visit (from the past 240 hour(s)). ?  ? ? ? ? ? ?Radiology Studies: ?No results found. ? ? ? ? ? ?Scheduled Meds: ? amLODipine  10 mg Oral Daily  ? aspirin EC  81 mg Oral Daily  ? atorvastatin  40 mg Oral q1800  ? calcitRIOL  0.25 mcg Oral Daily  ? carvedilol  6.25 mg Oral BID WC  ? Chlorhexidine Gluconate Cloth  6 each Topical Q0600  ? citalopram  20 mg Oral Daily  ? finasteride  5 mg Oral Daily  ? hydrALAZINE  100 mg Oral TID  ? isosorbide mononitrate  60 mg Oral Daily  ? pantoprazole  40 mg Oral Daily  ? sodium chloride flush  3 mL Intravenous Q12H  ? tamsulosin  0.4 mg Oral Daily  ? umeclidinium bromide  1 puff Inhalation Daily  ? cyanocobalamin  1,000 mcg Oral Daily  ? ?Continuous Infusions: ? ? LOS: 10 days  ? ? ?Time spent: 15 mins  ? ? ? ?Wyvonnia Dusky, MD ?[facility] ?Pager [phone] ? ?If 7PM-7AM, please contact night-coverage ?06/20/2021, 7:26 AM  ? ?

## 2021-06-20 NOTE — Progress Notes (Signed)
[facility]  ROUNDING NOTE   Subjective:   Peter [name] is a 81 year old male with past medical conditions including CAD, hypertension, asthma, hyperlipidemia, systolic heart failure, GERD, AAA repair, and chronic kidney disease stage IV.  Patient presents to the emergency department with complaints of shortness of breath.  Patient has been admitted for Shortness of breath [R06.02] SOB (shortness of breath) [R06.02] Elevated troponin [R77.8] Acute on chronic systolic CHF (congestive heart failure) (HCC) [I50.23] Congestive heart failure, unspecified HF chronicity, unspecified heart failure type [facility]) [I50.9]  Patient is known to our practice and is followed by Dr. Juleen China.  Patient recently had admission for the same complaint.  We have been consulted to monitor acute kidney injury.  Patient seen and evaluated during dialysis   HEMODIALYSIS FLOWSHEET:  Blood Flow Rate (mL/min): 300 mL/min Arterial Pressure (mmHg): -110 mmHg Venous Pressure (mmHg): 100 mmHg Transmembrane Pressure (mmHg): 50 mmHg Ultrafiltration Rate (mL/min): 500 mL/min Dialysate Flow Rate (mL/min): 500 ml/min Conductivity: Machine : 13.9 Conductivity: Machine : 13.9 Dialysis Fluid Bolus: Normal Saline Bolus Amount (mL): 250 mL Dialysate Change: Other (comment) (3k)  Patient tolerating treatment well seated in chair.  Confusion remains    Objective:  Vital signs in last 24 hours:  Temp:  [97.5 F (36.4 C)-98.5 F (36.9 C)] 97.6 F (36.4 C) (03/10 1145) Pulse Rate:  [29-149] 29 (03/10 1207) Resp:  [15-24] 15 (03/10 1207) BP: (135-164)/(55-73) 154/62 (03/10 1200) SpO2:  [94 %-99 %] 94 % (03/10 1207) Weight:  [93.1 kg] 93.1 kg (03/10 0841)  Weight change: 2.1 kg Filed Weights   06/19/21 0859 06/19/21 1159 06/20/21 0841  Weight: 93.6 kg 93.2 kg 93.1 kg    Intake/Output: I/O last 3 completed shifts: In: 200 [P.O.:200] Out: 750 [Urine:250; Other:500]   Intake/Output this shift:  Total  I/O In: -  Out: 1000 [Other:1000]  Physical Exam: General: NAD, resting quietly  Head: Normocephalic, atraumatic. Moist oral mucosal membranes  Eyes: Anicteric  Lungs:  Clear to auscultation, normal effort, 3L [place]  Heart: Regular rate and rhythm  Abdomen:  Soft, nontender, nondistended  Extremities: Trace peripheral edema.  Neurologic: Alert, confused, moving all four extremities  Skin: No lesions  Access Right PermCath placed on 0/06/90    Basic Metabolic Panel: Recent Labs  Lab 06/15/21 0556 06/16/21 0414 06/17/21 0616 06/18/21 0555 06/19/21 0636  NA 137 138 135 137 139  K 3.9 4.1 4.0 4.3 3.8  CL 105 106 105 108 106  CO2 20* 21* 19* 21* 24  GLUCOSE 118* 114* 99 107* 108*  BUN 84* 84* 88* 95* 70*  CREATININE 5.18* 5.35* 5.76* 5.88* 4.72*  CALCIUM 8.1* 8.1* 8.0* 8.0* 8.1*  PHOS  --  4.5  --   --  4.3     Liver Function Tests: Recent Labs  Lab 06/16/21 0414 06/19/21 0636  ALBUMIN 2.5* 2.6*    No results for input(s): LIPASE, AMYLASE in the last 168 hours. No results for input(s): AMMONIA in the last 168 hours.  CBC: Recent Labs  Lab 06/15/21 0556 06/19/21 0636 06/20/21 0437  WBC 7.9 7.6 7.9  NEUTROABS 5.7  --   --   HGB 8.6* 8.0* 8.1*  HCT 26.4* 25.1* 25.0*  MCV 88.9 89.6 89.0  PLT 121* 171 153     Cardiac Enzymes: No results for input(s): CKTOTAL, CKMB, CKMBINDEX, TROPONINI in the last 168 hours.  BNP: Invalid input(s): POCBNP  CBG: No results for input(s): GLUCAP in the last 168 hours.  Microbiology: Results for orders  placed or performed during the hospital encounter of 06/10/21  Resp Panel by RT-PCR (Flu A&B, Covid) Nasopharyngeal Swab     Status: None   Collection Time: 06/10/21  7:00 AM   Specimen: Nasopharyngeal Swab; Nasopharyngeal(NP) swabs in vial transport medium  Result Value Ref Range Status   SARS Coronavirus 2 by RT PCR NEGATIVE NEGATIVE Final    Comment: (NOTE) SARS-CoV-2 target nucleic acids are NOT DETECTED.  The  SARS-CoV-2 RNA is generally detectable in upper respiratory specimens during the acute phase of infection. The lowest concentration of SARS-CoV-2 viral copies this assay can detect is 138 copies/mL. A negative result does not preclude SARS-Cov-2 infection and should not be used as the sole basis for treatment or other patient management decisions. A negative result may occur with  improper specimen collection/handling, submission of specimen other than nasopharyngeal swab, presence of viral mutation(s) within the areas targeted by this assay, and inadequate number of viral copies(<138 copies/mL). A negative result must be combined with clinical observations, patient history, and epidemiological information. The expected result is Negative.  Fact Sheet for Patients:  EntrepreneurPulse.com.au  Fact Sheet for Healthcare Providers:  IncredibleEmployment.be  This test is no t yet approved or cleared by the [place] FDA and  has been authorized for detection and/or diagnosis of SARS-CoV-2 by FDA under an Emergency Use Authorization (EUA). This EUA will remain  in effect (meaning this test can be used) for the duration of the COVID-19 declaration under Section 564(b)(1) of the Act, 21 U.S.C.section 360bbb-3(b)(1), unless the authorization is terminated  or revoked sooner.       Influenza A by PCR NEGATIVE NEGATIVE Final   Influenza B by PCR NEGATIVE NEGATIVE Final    Comment: (NOTE) The Xpert Xpress SARS-CoV-2/FLU/RSV plus assay is intended as an aid in the diagnosis of influenza from Nasopharyngeal swab specimens and should not be used as a sole basis for treatment. Nasal washings and aspirates are unacceptable for Xpert Xpress SARS-CoV-2/FLU/RSV testing.  Fact Sheet for Patients: EntrepreneurPulse.com.au  Fact Sheet for Healthcare Providers: IncredibleEmployment.be  This test is not yet approved or  cleared by the [place] FDA and has been authorized for detection and/or diagnosis of SARS-CoV-2 by FDA under an Emergency Use Authorization (EUA). This EUA will remain in effect (meaning this test can be used) for the duration of the COVID-19 declaration under Section 564(b)(1) of the Act, 21 U.S.C. section 360bbb-3(b)(1), unless the authorization is terminated or revoked.  Performed at [facility], [address] [place], [place] [address]     Coagulation Studies: No results for input(s): LABPROT, INR in the last 72 hours.  Urinalysis: No results for input(s): COLORURINE, LABSPEC, PHURINE, GLUCOSEU, HGBUR, BILIRUBINUR, KETONESUR, PROTEINUR, UROBILINOGEN, NITRITE, LEUKOCYTESUR in the last 72 hours.  Invalid input(s): APPERANCEUR    Imaging: No results found.   Medications:       amLODipine  10 mg Oral Daily   aspirin EC  81 mg Oral Daily   atorvastatin  40 mg Oral q1800   calcitRIOL  0.25 mcg Oral Daily   carvedilol  6.25 mg Oral BID WC   Chlorhexidine Gluconate Cloth  6 each Topical Q0600   citalopram  20 mg Oral Daily   finasteride  5 mg Oral Daily   heparin sodium (porcine)       hydrALAZINE  100 mg Oral TID   isosorbide mononitrate  60 mg Oral Daily   pantoprazole  40 mg Oral Daily   sodium chloride flush  3  mL Intravenous Q12H   tamsulosin  0.4 mg Oral Daily   umeclidinium bromide  1 puff Inhalation Daily   cyanocobalamin  1,000 mcg Oral Daily   acetaminophen, albuterol, dextromethorphan-guaiFENesin, hydrALAZINE, loratadine, nitroGLYCERIN, ondansetron (ZOFRAN) IV, sodium chloride  Assessment/ Plan:  [name] is a 81 y.o.  male male with past medical conditions including CAD, hypertension, asthma, hyperlipidemia, systolic heart failure, GERD, AAA repair, and chronic kidney disease stage IV.  Patient presents to the emergency department with complaints of shortness of breath.  Patient has been admitted for Shortness of breath  [R06.02] SOB (shortness of breath) [R06.02] Elevated troponin [R77.8] Acute on chronic systolic CHF (congestive heart failure) (HCC) [I50.23] Congestive heart failure, unspecified HF chronicity, unspecified heart failure type ([place]) [I50.9]   End-stage renal disease requiring hemodialysis.        Renal function has shown no sustainable improvement over the past few days.  Urinary output has also fluctuated.  With considering current clinical presentation and declining renal function outpatient, we feel patient has progressed to end-stage renal disease requiring dialysis.  Appreciate vascular surgeon placing right chest PermCath yesterday.  Dialysis initiated on 06/18/21.    Second treatment received yesterday, UF goal 560m achieved. Receiving third treatment today, seated in chair.Tolerated well. Next treatment scheduled for Monday.  Dialysis coordinator aware of patient and outpatient clinic arrangements in progress.   Lab Results  Component Value Date   CREATININE 4.72 (H) 06/19/2021   CREATININE 5.88 (H) 06/18/2021   CREATININE 5.76 (H) 06/17/2021    Intake/Output Summary (Last 24 hours) at 06/20/2021 1210 Last data filed at 06/20/2021 1145 Gross per 24 hour  Intake 200 ml  Output 1000 ml  Net -800 ml    2.  Hypertension with chronic kidney disease.  Home regimen includes carvedilol, furosemide, hydralazine, isosorbide, losartan and spironolactone.  Diuretics remain held.  Blood pressure 154/62 during dialysis  3.  Chronic systolic heart failure.Previous echo from prior admission shows EF 40 to 45% with mildly decreased left ventricular function and mild LVH.  Cardiology following and recommend conservative management.  4.Anemia of chronic disease Patient hemoglobin remains low Patient iron saturation when checked on February 15 were within normal limits Hemoglobin 8.1. Will order EPO with dialysis treatments    LOS: 10 [place]3/10/202312:10 PM

## 2021-06-20 NOTE — Progress Notes (Signed)
Mobility Specialist - Progress Note ? ? 06/20/21 1400  ?Mobility  ?Activity Refused mobility  ? ? ?Pt sleeping upon arrival using 2L [place]. Pt refuses session d/t being fatigued from HD earlier in day. Will attempt at another date and time. ? ?Merrily Brittle ?Mobility Specialist ?06/20/21, 2:31 PM ? ? ? ? ?

## 2021-06-20 NOTE — Progress Notes (Signed)
Hemodialysis Post Treatment Note ?Tx Date: 06/20/2021 ?Tx Time: 3 hours ?Access: Right  IJ Catheter  ?UF Removed: 1000 ml ?Note: ?Patient completed 3 hours of treatment without incident, CVC functioned well. Blood flow rate maintained throughout treatment. UF target met. Patient able to sit in chair without difficulty.  ? ?

## 2021-06-21 DIAGNOSIS — I10 Essential (primary) hypertension: Secondary | ICD-10-CM | POA: Diagnosis not present

## 2021-06-21 DIAGNOSIS — N186 End stage renal disease: Secondary | ICD-10-CM | POA: Diagnosis not present

## 2021-06-21 DIAGNOSIS — I5023 Acute on chronic systolic (congestive) heart failure: Secondary | ICD-10-CM | POA: Diagnosis not present

## 2021-06-21 DIAGNOSIS — Z992 Dependence on renal dialysis: Secondary | ICD-10-CM | POA: Diagnosis not present

## 2021-06-21 LAB — CBC
HCT: 26.5 % — ABNORMAL LOW (ref 39.0–52.0)
Hemoglobin: 8.4 g/dL — ABNORMAL LOW (ref 13.0–17.0)
MCH: 28.7 pg (ref 26.0–34.0)
MCHC: 31.7 g/dL (ref 30.0–36.0)
MCV: 90.4 fL (ref 80.0–100.0)
Platelets: 153 10*3/uL (ref 150–400)
RBC: 2.93 MIL/uL — ABNORMAL LOW (ref 4.22–5.81)
RDW: 13.4 % (ref 11.5–15.5)
WBC: 8.1 10*3/uL (ref 4.0–10.5)
nRBC: 0 % (ref 0.0–0.2)

## 2021-06-21 NOTE — Progress Notes (Signed)
Physical Therapy Treatment ?Patient Details ?Name: Peter [name] ?MRN: [id number] ?DOB: July 13, 1940 ?Today's Date: 06/21/2021 ? ? ?History of Present Illness 81 y.o. 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 Patient was recently hospitalized from 2/15 - 2/22 due to chest pain and non-STEMI. ? ?  ?PT Comments  ? ? Pt was seen for progression of gait and balance skills for longer walk and then reviewed strengthening ex's to work on sarcopenia from chronic cardiopulm issues.  Pt is fatigued quickly with hip ex and requires some grading of the effort requested on knees.  He is appropriate for follow up plan for HHPT, and will work toward skills to make a safer transition home.     ?Recommendations for follow up therapy are one component of a multi-disciplinary discharge planning process, led by the attending physician.  Recommendations may be updated based on patient status, additional functional criteria and insurance authorization. ? ?Follow Up Recommendations ? Home health PT ?  ?  ?Assistance Recommended at Discharge PRN  ?Patient can return home with the following A little help with walking and/or transfers;A little help with bathing/dressing/bathroom;Assistance with cooking/housework ?  ?Equipment Recommendations ? None recommended by PT  ?  ?Recommendations for Other Services   ? ? ?  ?Precautions / Restrictions Precautions ?Precautions: Fall ?Precaution Comments: observe O2 and HR ?Restrictions ?Weight Bearing Restrictions: No  ?  ? ?Mobility ? Bed Mobility ?Overal bed mobility: Modified Independent ?  ?  ?  ?  ?  ?  ?  ?  ? ?Transfers ?Overall transfer level: Modified independent ?  ?  ?  ?  ?  ?  ?  ?  ?  ?  ? ?Ambulation/Gait ?Ambulation/Gait  assistance: Min guard ?Gait Distance (Feet): 300 Feet ?Assistive device: Rolling walker (2 wheels) ?Gait Pattern/deviations: Step-through pattern, Drifts right/left, Decreased stride length ?Gait velocity: reduced ?Gait velocity interpretation: <1.31 ft/sec, indicative of household ambulator ?Pre-gait activities: monitored sats ?General Gait Details: walked the entire trip with AD due to his feeling of SOB with the exertion even on 3L O2 ? ? ?Stairs ?  ?  ?  ?  ?  ? ? ?Wheelchair Mobility ?  ? ?Modified Rankin (Stroke Patients Only) ?  ? ? ?  ?Balance Overall balance assessment: Needs assistance ?Sitting-balance support: Feet supported ?Sitting balance-Leahy Scale: Good ?  ?  ?Standing balance support: Bilateral upper extremity supported, During functional activity ?Standing balance-Leahy Scale: Fair ?  ?  ?  ?  ?  ?  ?  ?  ?  ?  ?  ?  ?  ? ?  ?Cognition Arousal/Alertness: Awake/alert ?Behavior During Therapy: [facility] for tasks assessed/performed ?Overall Cognitive Status: Within Functional Limits for tasks assessed ?  ?  ?  ?  ?  ?  ?  ?  ?  ?  ?  ?  ?  ?  ?  ?  ?General Comments: has viisitor with good interaction, seems alert ?  ?  ? ?  ?Exercises General Exercises - Lower Extremity ?Ankle Circles/Pumps: AROM, 5 reps ?Long Arc Quad: AROM, Strengthening, 10 reps ?Heel Slides: AROM, Strengthening, 10 reps ?Hip ABduction/ADduction: Strengthening, 5 reps ? ?  ?General Comments General comments (skin integrity, edema, etc.): pt is having no big declines in O2 but note sats  hit a low of 94% ?  ?  ? ?Pertinent Vitals/Pain Pain Assessment ?Pain Assessment: No/denies pain  ? ? ?Home Living   ?  ?  ?  ?  ?  ?  ?  ?  ?  ?   ?  ?Prior Function    ?  ?  ?   ? ?PT Goals (current goals can now be found in the care plan section) Acute Rehab PT Goals ?Patient Stated Goal: to improve breathing to prevent hospitalizations ?Progress towards PT goals: Progressing toward goals ? ?  ?Frequency ? ? ? Min 2X/week ? ? ? ?  ?PT Plan Current  plan remains appropriate  ? ? ?Co-evaluation   ?  ?  ?  ?  ? ?  ?AM-PAC PT "6 Clicks" Mobility   ?Outcome Measure ? Help needed turning from your back to your side while in a flat bed without using bedrails?: None ?Help needed moving from lying on your back to sitting on the side of a flat bed without using bedrails?: None ?Help needed moving to and from a bed to a chair (including a wheelchair)?: None ?Help needed standing up from a chair using your arms (e.g., wheelchair or bedside chair)?: None ?Help needed to walk in hospital room?: A Little ?Help needed climbing 3-5 steps with a railing? : A Little ?6 Click Score: 22 ? ?  ?End of Session Equipment Utilized During Treatment: Oxygen;Gait belt ?Activity Tolerance: Patient tolerated treatment well;Treatment limited secondary to medical complications (Comment) ?Patient left: in bed;with call bell/phone within reach;with bed alarm set ?Nurse Communication: Mobility status ?PT Visit Diagnosis: Muscle weakness (generalized) (M62.81);Difficulty in walking, not elsewhere classified (R26.2) ?  ? ? ?Time: 6811-5726 ?PT Time Calculation (min) (ACUTE ONLY): 25 min ? ?Charges:  $Gait Training: 8-22 mins ?Ramond Dial ?06/21/2021, 12:56 PM ? ?Mee Hives, PT PhD ?Acute Rehab Dept. Number: [facility] [phone] and [place] [phone] ? ? ?

## 2021-06-21 NOTE — Progress Notes (Signed)
Mobility Specialist - Progress Note ? ? ? 06/21/21 1500  ?Mobility  ?Activity Refused mobility  ? ? ?Second attempt at session this date. Pt is sleeping upon arrival and refuses despite max encouragement, no reason specified. Family at bedside agreeable to return for session at later date and time. ? ?Peter [name] ?Mobility Specialist ?06/21/21, 3:27 PM ? ? ? ? ?

## 2021-06-21 NOTE — Progress Notes (Signed)
[facility]  ROUNDING NOTE   Subjective:   Peter [name] is a 81 year old male with past medical conditions including CAD, hypertension, asthma, hyperlipidemia, systolic heart failure, GERD, AAA repair, and chronic kidney disease stage IV.  Patient presents to the emergency department with complaints of shortness of breath.  Patient has been admitted for Shortness of breath [R06.02] SOB (shortness of breath) [R06.02] Elevated troponin [R77.8] Acute on chronic systolic CHF (congestive heart failure) (HCC) [I50.23] Congestive heart failure, unspecified HF chronicity, unspecified heart failure type [facility]) [I50.9]  Patient is known to our practice and is followed by Dr. Juleen China.  Patient recently had admission for the same complaint.  We have been consulted to monitor acute kidney injury.  Patent resting in bed,his brother and nephew at the bedside. He reports dyspnea on exertion, poor appetite. No nausea or vomiting. He also reports seeing 'shadows'    Objective:  Vital signs in last 24 hours:  Temp:  [97.6 F (36.4 C)-98.5 F (36.9 C)] 97.9 F (36.6 C) (03/11 1135) Pulse Rate:  [59-69] 65 (03/11 1135) Resp:  [18-20] 18 (03/11 1135) BP: (114-156)/(62-91) 114/79 (03/11 1135) SpO2:  [94 %-98 %] 94 % (03/11 1135) Weight:  [92 kg] 92 kg (03/11 0700)  Weight change: -0.5 kg Filed Weights   06/20/21 0841 06/20/21 1145 06/21/21 0700  Weight: 93.1 kg 92.2 kg 92 kg    Intake/Output: I/O last 3 completed shifts: In: 440 [P.O.:440] Out: 1800 [Urine:800; Other:1000]   Intake/Output this shift:  No intake/output data recorded.  Physical Exam: General: No acute distress  Head: Moist oral mucosal membranes  Eyes: Anicteric  Lungs:  Diminished to auscultation bilaterally, normal effort, 3L [place]  Heart: Regular rate and rhythm  Abdomen:  Soft, nontender, nondistended  Extremities: Trace peripheral edema.  Neurologic: Alert, awake,answers simple questions,appears forgetful   Skin: No acute lesions or rashes  Access Right PermCath placed on 06/17/1060    Basic Metabolic Panel: Recent Labs  Lab 06/15/21 0556 06/16/21 0414 06/17/21 0616 06/18/21 0555 06/19/21 0636  NA 137 138 135 137 139  K 3.9 4.1 4.0 4.3 3.8  CL 105 106 105 108 106  CO2 20* 21* 19* 21* 24  GLUCOSE 118* 114* 99 107* 108*  BUN 84* 84* 88* 95* 70*  CREATININE 5.18* 5.35* 5.76* 5.88* 4.72*  CALCIUM 8.1* 8.1* 8.0* 8.0* 8.1*  PHOS  --  4.5  --   --  4.3     Liver Function Tests: Recent Labs  Lab 06/16/21 0414 06/19/21 0636  ALBUMIN 2.5* 2.6*    No results for input(s): LIPASE, AMYLASE in the last 168 hours. No results for input(s): AMMONIA in the last 168 hours.  CBC: Recent Labs  Lab 06/15/21 0556 06/19/21 0636 06/20/21 0437 06/21/21 0426  WBC 7.9 7.6 7.9 8.1  NEUTROABS 5.7  --   --   --   HGB 8.6* 8.0* 8.1* 8.4*  HCT 26.4* 25.1* 25.0* 26.5*  MCV 88.9 89.6 89.0 90.4  PLT 121* 171 153 153     Cardiac Enzymes: No results for input(s): CKTOTAL, CKMB, CKMBINDEX, TROPONINI in the last 168 hours.  BNP: Invalid input(s): POCBNP  CBG: No results for input(s): GLUCAP in the last 168 hours.  Microbiology: Results for orders placed or performed during the hospital encounter of 06/10/21  Resp Panel by RT-PCR (Flu A&B, Covid) Nasopharyngeal Swab     Status: None   Collection Time: 06/10/21  7:00 AM   Specimen: Nasopharyngeal Swab; Nasopharyngeal(NP) swabs in vial transport medium  Result Value Ref Range Status   SARS Coronavirus 2 by RT PCR NEGATIVE NEGATIVE Final    Comment: (NOTE) SARS-CoV-2 target nucleic acids are NOT DETECTED.  The SARS-CoV-2 RNA is generally detectable in upper respiratory specimens during the acute phase of infection. The lowest concentration of SARS-CoV-2 viral copies this assay can detect is 138 copies/mL. A negative result does not preclude SARS-Cov-2 infection and should not be used as the sole basis for treatment or other patient  management decisions. A negative result may occur with  improper specimen collection/handling, submission of specimen other than nasopharyngeal swab, presence of viral mutation(s) within the areas targeted by this assay, and inadequate number of viral copies(<138 copies/mL). A negative result must be combined with clinical observations, patient history, and epidemiological information. The expected result is Negative.  Fact Sheet for Patients:  EntrepreneurPulse.com.au  Fact Sheet for Healthcare Providers:  IncredibleEmployment.be  This test is no t yet approved or cleared by the [place] FDA and  has been authorized for detection and/or diagnosis of SARS-CoV-2 by FDA under an Emergency Use Authorization (EUA). This EUA will remain  in effect (meaning this test can be used) for the duration of the COVID-19 declaration under Section 564(b)(1) of the Act, 21 U.S.C.section 360bbb-3(b)(1), unless the authorization is terminated  or revoked sooner.       Influenza A by PCR NEGATIVE NEGATIVE Final   Influenza B by PCR NEGATIVE NEGATIVE Final    Comment: (NOTE) The Xpert Xpress SARS-CoV-2/FLU/RSV plus assay is intended as an aid in the diagnosis of influenza from Nasopharyngeal swab specimens and should not be used as a sole basis for treatment. Nasal washings and aspirates are unacceptable for Xpert Xpress SARS-CoV-2/FLU/RSV testing.  Fact Sheet for Patients: EntrepreneurPulse.com.au  Fact Sheet for Healthcare Providers: IncredibleEmployment.be  This test is not yet approved or cleared by the [place] FDA and has been authorized for detection and/or diagnosis of SARS-CoV-2 by FDA under an Emergency Use Authorization (EUA). This EUA will remain in effect (meaning this test can be used) for the duration of the COVID-19 declaration under Section 564(b)(1) of the Act, 21 U.S.C. section 360bbb-3(b)(1),  unless the authorization is terminated or revoked.  Performed at [facility], [address] [place], [place] [address]     Coagulation Studies: No results for input(s): LABPROT, INR in the last 72 hours.  Urinalysis: No results for input(s): COLORURINE, LABSPEC, PHURINE, GLUCOSEU, HGBUR, BILIRUBINUR, KETONESUR, PROTEINUR, UROBILINOGEN, NITRITE, LEUKOCYTESUR in the last 72 hours.  Invalid input(s): APPERANCEUR    Imaging: No results found.   Medications:       amLODipine  10 mg Oral Daily   aspirin EC  81 mg Oral Daily   atorvastatin  40 mg Oral q1800   calcitRIOL  0.25 mcg Oral Daily   carvedilol  6.25 mg Oral BID WC   Chlorhexidine Gluconate Cloth  6 each Topical Q0600   citalopram  20 mg Oral Daily   finasteride  5 mg Oral Daily   hydrALAZINE  100 mg Oral TID   isosorbide mononitrate  60 mg Oral Daily   pantoprazole  40 mg Oral Daily   sodium chloride flush  3 mL Intravenous Q12H   tamsulosin  0.4 mg Oral Daily   umeclidinium bromide  1 puff Inhalation Daily   cyanocobalamin  1,000 mcg Oral Daily   acetaminophen, albuterol, dextromethorphan-guaiFENesin, hydrALAZINE, loratadine, nitroGLYCERIN, ondansetron (ZOFRAN) IV, sodium chloride  Assessment/ Plan:  Mr. ANDREU DRUDGE is a 81 y.o.  male male with past medical conditions including CAD, hypertension, asthma, hyperlipidemia, systolic heart failure, GERD, AAA repair, and chronic kidney disease stage IV.  Patient presents to the emergency department with complaints of shortness of breath.  Patient has been admitted for Shortness of breath [R06.02] SOB (shortness of breath) [R06.02] Elevated troponin [R77.8] Acute on chronic systolic CHF (congestive heart failure) (HCC) [I50.23] Congestive heart failure, unspecified HF chronicity, unspecified heart failure type ([place]) [I50.9]   End-stage renal disease requiring hemodialysis.     Considering current clinical presentation and declining renal function as  outpatient,progression to end-stage renal disease is considered, he requires dialysis.  Right chest PermCath by vascular placed on 06/17/2021 Dialysis initiated on 06/18/21.  Received 3rd treatment on Friday Dialysis coordinator working on outpatient dialysis placement, his daughter got updated by [facility] today   Lab Results  Component Value Date   CREATININE 4.72 (H) 06/19/2021   CREATININE 5.88 (H) 06/18/2021   CREATININE 5.76 (H) 06/17/2021    Intake/Output Summary (Last 24 hours) at 06/21/2021 1347 Last data filed at 06/21/2021 0451 Gross per 24 hour  Intake 0 ml  Output 400 ml  Net -400 ml    2.  Hypertension with chronic kidney disease.  Home regimen includes carvedilol, furosemide, hydralazine, isosorbide, losartan and spironolactone.  Diuretics remain held. Remains normotensive today  3.  Chronic systolic heart failure.Previous echo from prior admission shows EF 40 to 45% with mildly decreased left ventricular function and mild LVH.   Management per Cardiology team  4.Anemia of chronic disease Lab Results  Component Value Date   HGB 8.4 (L) 06/21/2021   Will continue monitoring closely  5.Secondary hyperparathyroidism Lab Results  Component Value Date   CALCIUM 8.1 (L) 06/19/2021   PHOS 4.3 06/19/2021   Continue Calcitriol   LOS: 11 [place] Difranco 3/11/20231:47 PM

## 2021-06-21 NOTE — Progress Notes (Incomplete)
Assumed care of pt at 1900. A&O x4. On 3 L [place] overnight. No c/o pain. Encouraging PO intake overnight. Full assessment per flowsheets. Medication administration per MAR. Pt resting comfortably in bed at this time. Call bell within reach. Comfort and safety maintained.

## 2021-06-21 NOTE — TOC Progression Note (Signed)
Transition of Care (TOC) - Progression Note  ? ? ?Patient Details  ?Name: Peter [name] ?MRN: [id number] ?Date of Birth: 01-14-1941 ? ?Transition of Care (TOC) CM/SW Contact  ?Harriet Masson, RN ?Phone Number:[phone] ?06/21/2021, 3:56 PM ? ?Clinical Narrative:    ?Pt from [facility] [place] for PT services spoke with Angie for the requested services. No further needs  presented at this time. ? ?TOC remains available for any additional needs. ? ? ?Expected Discharge Plan: [place] ?Barriers to Discharge: Continued Medical Work up ? ?Expected Discharge Plan and Services ?Expected Discharge Plan: [place] ?  ?  ?Post Acute Care Choice: Resumption of Svcs/PTA Provider ?Living arrangements for the past 2 months: [place] ?                ?  ?  ?  ?  ?  ?HH Arranged: PT ?[place] Agency: [facility] ([place]) ?Date HH Agency Contacted: 06/11/21 ?  ?Representative spoke with at [place]: Floydene Flock ? ? ?Social Determinants of Health (SDOH) Interventions ?  ? ?Readmission Risk Interventions ?Readmission Risk Prevention Plan 06/11/2021 04/28/2021  ?Transportation Screening Complete Complete  ?PCP or Specialist Appt within 3-5 Days - Complete  ?[place] or Home Care Consult - Complete  ?Social Work Consult for [place] Planning/Counseling - Complete  ?Palliative Care Screening - Not Applicable  ?Medication Review Press photographer) Complete Complete  ?PCP or Specialist appointment within 3-5 days of discharge Complete -  ?[place] or Home Care Consult Complete -  ?SW Recovery Care/Counseling Consult Complete -  ?Palliative Care Screening Not Applicable -  ?[place] Not Applicable -  ?Some recent data might be hidden  ? ? ?

## 2021-06-21 NOTE — Progress Notes (Signed)
?PROGRESS NOTE ? ? ? ?MECHEL SCHUTTER  NKN:[id number] DOB: 1941/02/05 DOA: 06/10/2021 ?PCP: Tracie Harrier, MD  ? ?Assessment & Plan: ?  ?Principal Problem: ?  Acute on chronic systolic CHF (congestive heart failure) ([place]) ?Active Problems: ?  CAD (coronary artery disease) ?  HTN (hypertension) ?  Hyperlipidemia ?  BPH (benign prostatic hyperplasia) ?  NSTEMI (non-ST elevated myocardial infarction) ([place]) ?  AKI (acute kidney injury) ([place]) ?  Normocytic anemia ?  HLD (hyperlipidemia) ?  CKD (chronic kidney disease), stage IV ([place]) ?  Chronic respiratory failure with hypoxia (HCC) ? ? ?Acute on chronic systolic CHF: compensated. Volume management w/ HD. Monitor I/Os  ? ?Elevated troponins: likely secondary to demand ischemia as per cardio.  ? 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 S/p permacath placement by vasc surg. HD as per nephro  ? ?Chronic hypoxic respiratory failure: continue on supplemental oxygen. Encourage incentive spirometry  ? ?Leukocytosis: resolved ? ?HTN: continue on amlodipine, coreg & imdur  ? ?HLD: continue on statin  ? ?BPH: continue on home dose of finatsteride ? ?Depression: severity unknown. Continue on home dose of citalopram  ? ?Thrombocytopenia: chronic and labile  ? ?ACD: likely secondary to CKD. H&H are labile. No need for a transfusion currently  ? ? ? ?DVT prophylaxis: SCDs ?Code Status:  full  ?Family Communication:  ?Disposition Plan: d/c home w/ HH  ? ?Level of care: Med-Surg ? ?Status is: Inpatient ?Remains inpatient appropriate because: still needs outpatient HD spot  ? ? ? ?Consultants:  ?Nephro ?Vasc surg  ? ?Procedures:  ? ?Antimicrobials:  ? ? ?Subjective: ?Pt c/o malaise  ? ?Objective: ?Vitals:  ? 06/20/21 2058 06/21/21 0454 06/21/21 0700 06/21/21 0722  ?BP: (!) 153/62 (!) 156/91  (!) 143/65  ?Pulse: (!) 59 69  63  ?Resp:  20  19  ?Temp: 98.3 ?F (36.8 ?C) 97.6 ?F (36.4 ?C)  98 ?F (36.7 ?C)  ?TempSrc:  Oral    ?SpO2: 97% 98%  98%  ?Weight:   92 kg   ?Height:       ? ? ?Intake/Output Summary (Last 24 hours) at 06/21/2021 1119 ?Last data filed at 06/21/2021 0451 ?Gross per 24 hour  ?Intake 240 ml  ?Output 1400 ml  ?Net -1160 ml  ? ?Filed Weights  ? 06/20/21 0841 06/20/21 1145 06/21/21 0700  ?Weight: 93.1 kg 92.2 kg 92 kg  ? ? ?Examination: ? ?General exam: Appears calm & comfortable  ?Respiratory system: decreased breath sounds b/l otherwise clear ?Cardiovascular system: S1/S2+. No rubs or clicks  ?Gastrointestinal system:abd is soft, NT, ND & normal bowel sounds  ?Central nervous system: alert and oriented. Moves all extremities  ?Psychiatry: judgement and insight appears at baseline. Flat mood and affect ? ? ? ?Data Reviewed: I have personally reviewed following labs and imaging studies ? ?CBC: ?Recent Labs  ?Lab 06/15/21 ?3790 06/19/21 ?0636 06/20/21 ?2409 06/21/21 ?0426  ?WBC 7.9 7.6 7.9 8.1  ?NEUTROABS 5.7  --   --   --   ?HGB 8.6* 8.0* 8.1* 8.4*  ?HCT 26.4* 25.1* 25.0* 26.5*  ?MCV 88.9 89.6 89.0 90.4  ?PLT 121* 171 153 153  ? ?Basic Metabolic Panel: ?Recent Labs  ?Lab 06/15/21 ?7353 06/16/21 ?2992 06/17/21 ?4268 06/18/21 ?3419 06/19/21 ?0636  ?NA 137 138 135 137 139  ?K 3.9 4.1 4.0 4.3 3.8  ?CL 105 106 105 108 106  ?CO2 20* 21* 19* 21* 24  ?GLUCOSE 118* 114* 99 107* 108*  ?BUN 84* 84*  88* 95* 70*  ?CREATININE 5.18* 5.35* 5.76* 5.88* 4.72*  ?CALCIUM 8.1* 8.1* 8.0* 8.0* 8.1*  ?PHOS  --  4.5  --   --  4.3  ? ?GFR: ?Estimated Creatinine Clearance: 14.2 mL/min (A) (by C-G formula based on SCr of 4.72 mg/dL (H)). ?Liver Function Tests: ?Recent Labs  ?Lab 06/16/21 ?0414 06/19/21 ?0636  ?ALBUMIN 2.5* 2.6*  ? ?No results for input(s): LIPASE, AMYLASE in the last 168 hours. ?No results for input(s): AMMONIA in the last 168 hours. ?Coagulation Profile: ?No results for input(s): INR, PROTIME in the last 168 hours. ?Cardiac Enzymes: ?No results for input(s): CKTOTAL, CKMB, CKMBINDEX, TROPONINI in the last 168 hours. ?BNP (last 3 results) ?No results for input(s): PROBNP in the last  8760 hours. ?HbA1C: ?No results for input(s): HGBA1C in the last 72 hours. ?CBG: ?No results for input(s): GLUCAP in the last 168 hours. ?Lipid Profile: ?No results for input(s): CHOL, HDL, LDLCALC, TRIG, CHOLHDL, LDLDIRECT in the last 72 hours. ?Thyroid Function Tests: ?No results for input(s): TSH, T4TOTAL, FREET4, T3FREE, THYROIDAB in the last 72 hours. ?Anemia Panel: ?No results for input(s): VITAMINB12, FOLATE, FERRITIN, TIBC, IRON, RETICCTPCT in the last 72 hours. ?Sepsis Labs: ?No results for input(s): PROCALCITON, LATICACIDVEN in the last 168 hours. ? ?No results found for this or any previous visit (from the past 240 hour(s)). ?  ? ? ? ? ? ?Radiology Studies: ?No results found. ? ? ? ? ? ?Scheduled Meds: ? amLODipine  10 mg Oral Daily  ? aspirin EC  81 mg Oral Daily  ? atorvastatin  40 mg Oral q1800  ? calcitRIOL  0.25 mcg Oral Daily  ? carvedilol  6.25 mg Oral BID WC  ? Chlorhexidine Gluconate Cloth  6 each Topical Q0600  ? citalopram  20 mg Oral Daily  ? finasteride  5 mg Oral Daily  ? hydrALAZINE  100 mg Oral TID  ? isosorbide mononitrate  60 mg Oral Daily  ? pantoprazole  40 mg Oral Daily  ? sodium chloride flush  3 mL Intravenous Q12H  ? tamsulosin  0.4 mg Oral Daily  ? umeclidinium bromide  1 puff Inhalation Daily  ? cyanocobalamin  1,000 mcg Oral Daily  ? ?Continuous Infusions: ? ? LOS: 11 days  ? ? ?Time spent: 15 mins  ? ? ? ?Wyvonnia Dusky, MD ?[facility] ?Pager [phone] ? ?If 7PM-7AM, please contact night-coverage ?06/21/2021, 11:19 AM  ? ?

## 2021-06-22 DIAGNOSIS — Z992 Dependence on renal dialysis: Secondary | ICD-10-CM | POA: Diagnosis not present

## 2021-06-22 DIAGNOSIS — I10 Essential (primary) hypertension: Secondary | ICD-10-CM | POA: Diagnosis not present

## 2021-06-22 DIAGNOSIS — I5023 Acute on chronic systolic (congestive) heart failure: Secondary | ICD-10-CM | POA: Diagnosis not present

## 2021-06-22 DIAGNOSIS — N186 End stage renal disease: Secondary | ICD-10-CM | POA: Diagnosis not present

## 2021-06-22 LAB — BASIC METABOLIC PANEL
Anion gap: 8 (ref 5–15)
BUN: 31 mg/dL — ABNORMAL HIGH (ref 8–23)
CO2: 28 mmol/L (ref 22–32)
Calcium: 8.3 mg/dL — ABNORMAL LOW (ref 8.9–10.3)
Chloride: 101 mmol/L (ref 98–111)
Creatinine, Ser: 3.16 mg/dL — ABNORMAL HIGH (ref 0.61–1.24)
GFR, Estimated: 19 mL/min — ABNORMAL LOW (ref >60)
Glucose, Bld: 99 mg/dL (ref 70–99)
Potassium: 3.6 mmol/L (ref 3.5–5.1)
Sodium: 137 mmol/L (ref 135–145)

## 2021-06-22 LAB — CBC
HCT: 25.3 % — ABNORMAL LOW (ref 39.0–52.0)
Hemoglobin: 8 g/dL — ABNORMAL LOW (ref 13.0–17.0)
MCH: 28.3 pg (ref 26.0–34.0)
MCHC: 31.6 g/dL (ref 30.0–36.0)
MCV: 89.4 fL (ref 80.0–100.0)
Platelets: 165 10*3/uL (ref 150–400)
RBC: 2.83 MIL/uL — ABNORMAL LOW (ref 4.22–5.81)
RDW: 13.4 % (ref 11.5–15.5)
WBC: 9 10*3/uL (ref 4.0–10.5)
nRBC: 0 % (ref 0.0–0.2)

## 2021-06-22 NOTE — Progress Notes (Signed)
[facility]  ROUNDING NOTE   Subjective:   Peter [name] is a 81 year old male with past medical conditions including CAD, hypertension, asthma, hyperlipidemia, systolic heart failure, GERD, AAA repair, and chronic kidney disease stage IV.  Patient presents to the emergency department with complaints of shortness of breath.  Patient has been admitted for Shortness of breath [R06.02] SOB (shortness of breath) [R06.02] Elevated troponin [R77.8] Acute on chronic systolic CHF (congestive heart failure) (HCC) [I50.23] Congestive heart failure, unspecified HF chronicity, unspecified heart failure type [facility]) [I50.9]  Patient is known to our practice and is followed by Dr. Juleen China.  Patient recently had admission for the same complaint.  We have been consulted to monitor acute kidney injury.  -  Pt seen at bedside.  Due for HD again tomorrow.  Resting comfortably at the moment.     Objective:  Vital signs in last 24 hours:  Temp:  [97.5 F (36.4 C)-98.3 F (36.8 C)] 98.2 F (36.8 C) (03/12 1545) Pulse Rate:  [59-69] 67 (03/12 1545) Resp:  [16-20] 19 (03/12 1545) BP: (131-152)/(55-87) 131/56 (03/12 1545) SpO2:  [91 %-99 %] 99 % (03/12 1545) Weight:  [90.4 kg] 90.4 kg (03/12 0517)  Weight change: -2.743 kg Filed Weights   06/20/21 1145 06/21/21 0700 06/22/21 0517  Weight: 92.2 kg 92 kg 90.4 kg    Intake/Output: I/O last 3 completed shifts: In: 360 [P.O.:360] Out: 1125 [Urine:1125]   Intake/Output this shift:  Total I/O In: 720 [P.O.:720] Out: -   Physical Exam: General: No acute distress  Head: Moist oral mucosal membranes  Eyes: Anicteric  Lungs:  Diminished to auscultation bilaterally, normal effort  Heart: Regular rate and rhythm  Abdomen:  Soft, nontender, nondistended  Extremities: Trace peripheral edema.  Neurologic: Alert, awake,answers simple questions  Skin: No acute lesions or rashes  Access Right PermCath placed on 11/12/4479    Basic Metabolic  Panel: Recent Labs  Lab 06/16/21 0414 06/17/21 0616 06/18/21 0555 06/19/21 0636 06/22/21 0402  NA 138 135 137 139 137  K 4.1 4.0 4.3 3.8 3.6  CL 106 105 108 106 101  CO2 21* 19* 21* 24 28  GLUCOSE 114* 99 107* 108* 99  BUN 84* 88* 95* 70* 31*  CREATININE 5.35* 5.76* 5.88* 4.72* 3.16*  CALCIUM 8.1* 8.0* 8.0* 8.1* 8.3*  PHOS 4.5  --   --  4.3  --      Liver Function Tests: Recent Labs  Lab 06/16/21 0414 06/19/21 0636  ALBUMIN 2.5* 2.6*    No results for input(s): LIPASE, AMYLASE in the last 168 hours. No results for input(s): AMMONIA in the last 168 hours.  CBC: Recent Labs  Lab 06/19/21 0636 06/20/21 0437 06/21/21 0426 06/22/21 0402  WBC 7.6 7.9 8.1 9.0  HGB 8.0* 8.1* 8.4* 8.0*  HCT 25.1* 25.0* 26.5* 25.3*  MCV 89.6 89.0 90.4 89.4  PLT 171 153 153 165     Cardiac Enzymes: No results for input(s): CKTOTAL, CKMB, CKMBINDEX, TROPONINI in the last 168 hours.  BNP: Invalid input(s): POCBNP  CBG: No results for input(s): GLUCAP in the last 168 hours.  Microbiology: Results for orders placed or performed during the hospital encounter of 06/10/21  Resp Panel by RT-PCR (Flu A&B, Covid) Nasopharyngeal Swab     Status: None   Collection Time: 06/10/21  7:00 AM   Specimen: Nasopharyngeal Swab; Nasopharyngeal(NP) swabs in vial transport medium  Result Value Ref Range Status   SARS Coronavirus 2 by RT PCR NEGATIVE NEGATIVE Final  Comment: (NOTE) SARS-CoV-2 target nucleic acids are NOT DETECTED.  The SARS-CoV-2 RNA is generally detectable in upper respiratory specimens during the acute phase of infection. The lowest concentration of SARS-CoV-2 viral copies this assay can detect is 138 copies/mL. A negative result does not preclude SARS-Cov-2 infection and should not be used as the sole basis for treatment or other patient management decisions. A negative result may occur with  improper specimen collection/handling, submission of specimen other than  nasopharyngeal swab, presence of viral mutation(s) within the areas targeted by this assay, and inadequate number of viral copies(<138 copies/mL). A negative result must be combined with clinical observations, patient history, and epidemiological information. The expected result is Negative.  Fact Sheet for Patients:  EntrepreneurPulse.com.au  Fact Sheet for Healthcare Providers:  IncredibleEmployment.be  This test is no t yet approved or cleared by the [place] FDA and  has been authorized for detection and/or diagnosis of SARS-CoV-2 by FDA under an Emergency Use Authorization (EUA). This EUA will remain  in effect (meaning this test can be used) for the duration of the COVID-19 declaration under Section 564(b)(1) of the Act, 21 U.S.C.section 360bbb-3(b)(1), unless the authorization is terminated  or revoked sooner.       Influenza A by PCR NEGATIVE NEGATIVE Final   Influenza B by PCR NEGATIVE NEGATIVE Final    Comment: (NOTE) The Xpert Xpress SARS-CoV-2/FLU/RSV plus assay is intended as an aid in the diagnosis of influenza from Nasopharyngeal swab specimens and should not be used as a sole basis for treatment. Nasal washings and aspirates are unacceptable for Xpert Xpress SARS-CoV-2/FLU/RSV testing.  Fact Sheet for Patients: EntrepreneurPulse.com.au  Fact Sheet for Healthcare Providers: IncredibleEmployment.be  This test is not yet approved or cleared by the [place] FDA and has been authorized for detection and/or diagnosis of SARS-CoV-2 by FDA under an Emergency Use Authorization (EUA). This EUA will remain in effect (meaning this test can be used) for the duration of the COVID-19 declaration under Section 564(b)(1) of the Act, 21 U.S.C. section 360bbb-3(b)(1), unless the authorization is terminated or revoked.  Performed at [facility], [address] [place], [place]  [address]     Coagulation Studies: No results for input(s): LABPROT, INR in the last 72 hours.  Urinalysis: No results for input(s): COLORURINE, LABSPEC, PHURINE, GLUCOSEU, HGBUR, BILIRUBINUR, KETONESUR, PROTEINUR, UROBILINOGEN, NITRITE, LEUKOCYTESUR in the last 72 hours.  Invalid input(s): APPERANCEUR    Imaging: No results found.   Medications:       amLODipine  10 mg Oral Daily   aspirin EC  81 mg Oral Daily   atorvastatin  40 mg Oral q1800   calcitRIOL  0.25 mcg Oral Daily   carvedilol  6.25 mg Oral BID WC   Chlorhexidine Gluconate Cloth  6 each Topical Q0600   citalopram  20 mg Oral Daily   finasteride  5 mg Oral Daily   hydrALAZINE  100 mg Oral TID   isosorbide mononitrate  60 mg Oral Daily   pantoprazole  40 mg Oral Daily   sodium chloride flush  3 mL Intravenous Q12H   tamsulosin  0.4 mg Oral Daily   umeclidinium bromide  1 puff Inhalation Daily   cyanocobalamin  1,000 mcg Oral Daily   acetaminophen, albuterol, dextromethorphan-guaiFENesin, hydrALAZINE, loratadine, nitroGLYCERIN, ondansetron (ZOFRAN) IV, sodium chloride  Assessment/ Plan:  Mr. GEORDAN XU is a 81 y.o.  male male with past medical conditions including CAD, hypertension, asthma, hyperlipidemia, systolic heart failure, GERD, AAA repair, and chronic  kidney disease stage IV.  Patient presents to the emergency department with complaints of shortness of breath.  Patient has been admitted for Shortness of breath [R06.02] SOB (shortness of breath) [R06.02] Elevated troponin [R77.8] Acute on chronic systolic CHF (congestive heart failure) (HCC) [I50.23] Congestive heart failure, unspecified HF chronicity, unspecified heart failure type ([place]) [I50.9]   End-stage renal disease requiring hemodialysis.     Considering current clinical presentation and declining renal function as outpatient,progression to end-stage renal disease is considered, he requires dialysis. Right chest PermCath by vascular placed on  06/17/2021. Dialysis initiated on 06/18/21.  Received 3rd treatment on 06/20/21. -Overall patient has improved with dialysis treatment.  We are planning for next dialysis treatment tomorrow.  Outpatient placement pending.   Lab Results  Component Value Date   CREATININE 3.16 (H) 06/22/2021   CREATININE 4.72 (H) 06/19/2021   CREATININE 5.88 (H) 06/18/2021    Intake/Output Summary (Last 24 hours) at 06/22/2021 1557 Last data filed at 06/22/2021 1300 Gross per 24 hour  Intake 960 ml  Output 500 ml  Net 460 ml    2.  Hypertension with chronic kidney disease.  Home regimen includes carvedilol, furosemide, hydralazine, isosorbide, losartan and spironolactone.  Diuretics remain held. Blood pressure 131/56.  3.  Chronic systolic heart failure. Previous echo from prior admission shows EF 40 to 45% with mildly decreased left ventricular function and mild LVH.   Management per Cardiology team  4.Anemia of chronic disease Lab Results  Component Value Date   HGB 8.0 (L) 06/22/2021  Consider adding Mircera as an outpatient.  5.Secondary hyperparathyroidism Lab Results  Component Value Date   CALCIUM 8.3 (L) 06/22/2021   PHOS 4.3 06/19/2021  Phosphorus 4.3 and acceptable.  Continue calcitriol.   LOS: 12 Khaidyn Staebell 3/12/20233:57 PM

## 2021-06-22 NOTE — Progress Notes (Signed)
Mobility Specialist - Progress Note ? ? 06/22/21 1500  ?Mobility  ?Activity Ambulated independently in hallway;Stood at bedside;Dangled on edge of bed  ?Level of Assistance Standby assist, set-up cues, supervision of patient - no hands on  ?Assistive Device Front wheel walker  ?Distance Ambulated (ft) 170 ft  ?Activity Response Tolerated well  ?$Mobility charge 1 Mobility  ? ? ?Pre-mobility: 69 HR, 94% SpO2 ?During mobility: 73 HR,  94% SpO2 ? ?Pt supine upon arrival using 3L. Pt completes bed mobility and STS ModI. Pt ambulates 169f SBA --- one pt initiated break and voices no complaints. Spontaneously returns to room after one lap. Pt left with needs in reach and family at bedside. ? ?MMerrily Brittle?Mobility Specialist ?06/22/21, 3:27 PM ? ? ? ?

## 2021-06-22 NOTE — Progress Notes (Signed)
?PROGRESS NOTE ? ? ? ?Peter [name]  DSK:[id number] DOB: 12/12/1940 DOA: 06/10/2021 ?PCP: Tracie Harrier, MD  ? ?Assessment & Plan: ?  ?Principal Problem: ?  Acute on chronic systolic CHF (congestive heart failure) ([place]) ?Active Problems: ?  CAD (coronary artery disease) ?  HTN (hypertension) ?  Hyperlipidemia ?  BPH (benign prostatic hyperplasia) ?  NSTEMI (non-ST elevated myocardial infarction) ([place]) ?  AKI (acute kidney injury) ([place]) ?  Normocytic anemia ?  HLD (hyperlipidemia) ?  CKD (chronic kidney disease), stage IV ([place]) ?  Chronic respiratory failure with hypoxia (HCC) ? ? ?Acute on chronic systolic CHF: compensated. Volume management w/ HD. Monitor I/Os.  ? ?Elevated troponins: likely secondary to demand ischemia as per cardio.  ? ?AKI on CKDIV : which progressed to ESRD. HD as per nephro  ? ?Chronic hypoxic respiratory failure: continue on supplemental oxygen.  ? ?Leukocytosis: resolved ? ?HTN: continue coreg, imdur, amlodipine  ? ?HLD: continue on statin  ? ?BPH: continue on home dose of finasteride  ? ?Depression: severity unknown. Continue on home dose of citalopram  ? ?Thrombocytopenia: WNL currently  ? ?ACD: likely secondary to CKD. H&H are labile. Will transfuse if Hb < 7.0  ? ? ? ? ?DVT prophylaxis: SCDs ?Code Status:  full  ?Family Communication:  ?Disposition Plan: d/c home w/ HH  ? ?Level of care: Med-Surg ? ?Status is: Inpatient ?Remains inpatient appropriate because: still needs outpatient HD spot  ? ? ? ?Consultants:  ?Nephro ?Vasc surg  ? ?Procedures:  ? ?Antimicrobials:  ? ? ?Subjective: ?Pt c/o fatigue  ? ?Objective: ?Vitals:  ? 06/21/21 2204 06/21/21 2334 06/22/21 0418 06/22/21 0517  ?BP: (!) 151/71 (!) 150/57 (!) 152/75   ?Pulse:  63 69   ?Resp:  16 16   ?Temp:  98.3 ?F (36.8 ?C) (!) 97.5 ?F (36.4 ?C)   ?TempSrc:      ?SpO2:  93% 97%   ?Weight:    90.4 kg  ?Height:      ? ? ?Intake/Output Summary (Last 24 hours) at 06/22/2021 0736 ?Last data filed at 06/22/2021 0600 ?Gross per 24 hour   ?Intake 360 ml  ?Output 925 ml  ?Net -565 ml  ? ?Filed Weights  ? 06/20/21 1145 06/21/21 0700 06/22/21 0517  ?Weight: 92.2 kg 92 kg 90.4 kg  ? ? ?Examination: ? ?General exam: Appears calm and comfortable  ?Respiratory system: diminished breath sounds b/l  ?Cardiovascular system: S1 & S2+. No rubs or clicks ?Gastrointestinal system: Abd is soft, NT, ND & normal bowel sounds  ?Central nervous system: alert and oriented. Moves all extremities  ?Psychiatry: judgement and insight appears at baseline. Flat mood and affect  ? ? ? ?Data Reviewed: I have personally reviewed following labs and imaging studies ? ?CBC: ?Recent Labs  ?Lab 06/19/21 ?0636 06/20/21 ?6203 06/21/21 ?5597 06/22/21 ?0402  ?WBC 7.6 7.9 8.1 9.0  ?HGB 8.0* 8.1* 8.4* 8.0*  ?HCT 25.1* 25.0* 26.5* 25.3*  ?MCV 89.6 89.0 90.4 89.4  ?PLT 171 153 153 165  ? ?Basic Metabolic Panel: ?Recent Labs  ?Lab 06/16/21 ?0414 06/17/21 ?0616 06/18/21 ?4163 06/19/21 ?8453 06/22/21 ?0402  ?NA 138 135 137 139 137  ?K 4.1 4.0 4.3 3.8 3.6  ?CL 106 105 108 106 101  ?CO2 21* 19* 21* 24 28  ?GLUCOSE 114* 99 107* 108* 99  ?BUN 84* 88* 95* 70* 31*  ?CREATININE 5.35* 5.76* 5.88* 4.72* 3.16*  ?CALCIUM 8.1* 8.0* 8.0* 8.1* 8.3*  ?PHOS 4.5  --   --  4.3  --   ? ?GFR: ?Estimated Creatinine Clearance: 21.1 mL/min (A) (by C-G formula based on SCr of 3.16 mg/dL (H)). ?Liver Function Tests: ?Recent Labs  ?Lab 06/16/21 ?0414 06/19/21 ?0636  ?ALBUMIN 2.5* 2.6*  ? ?No results for input(s): LIPASE, AMYLASE in the last 168 hours. ?No results for input(s): AMMONIA in the last 168 hours. ?Coagulation Profile: ?No results for input(s): INR, PROTIME in the last 168 hours. ?Cardiac Enzymes: ?No results for input(s): CKTOTAL, CKMB, CKMBINDEX, TROPONINI in the last 168 hours. ?BNP (last 3 results) ?No results for input(s): PROBNP in the last 8760 hours. ?HbA1C: ?No results for input(s): HGBA1C in the last 72 hours. ?CBG: ?No results for input(s): GLUCAP in the last 168 hours. ?Lipid Profile: ?No results  for input(s): CHOL, HDL, LDLCALC, TRIG, CHOLHDL, LDLDIRECT in the last 72 hours. ?Thyroid Function Tests: ?No results for input(s): TSH, T4TOTAL, FREET4, T3FREE, THYROIDAB in the last 72 hours. ?Anemia Panel: ?No results for input(s): VITAMINB12, FOLATE, FERRITIN, TIBC, IRON, RETICCTPCT in the last 72 hours. ?Sepsis Labs: ?No results for input(s): PROCALCITON, LATICACIDVEN in the last 168 hours. ? ?No results found for this or any previous visit (from the past 240 hour(s)). ?  ? ? ? ? ? ?Radiology Studies: ?No results found. ? ? ? ? ? ?Scheduled Meds: ? amLODipine  10 mg Oral Daily  ? aspirin EC  81 mg Oral Daily  ? atorvastatin  40 mg Oral q1800  ? calcitRIOL  0.25 mcg Oral Daily  ? carvedilol  6.25 mg Oral BID WC  ? Chlorhexidine Gluconate Cloth  6 each Topical Q0600  ? citalopram  20 mg Oral Daily  ? finasteride  5 mg Oral Daily  ? hydrALAZINE  100 mg Oral TID  ? isosorbide mononitrate  60 mg Oral Daily  ? pantoprazole  40 mg Oral Daily  ? sodium chloride flush  3 mL Intravenous Q12H  ? tamsulosin  0.4 mg Oral Daily  ? umeclidinium bromide  1 puff Inhalation Daily  ? cyanocobalamin  1,000 mcg Oral Daily  ? ?Continuous Infusions: ? ? LOS: 12 days  ? ? ?Time spent: 15 mins  ? ? ? ?Wyvonnia Dusky, MD ?[facility] ?Pager [phone] ? ?If 7PM-7AM, please contact night-coverage ?06/22/2021, 7:36 AM  ? ?

## 2021-06-23 DIAGNOSIS — N186 End stage renal disease: Secondary | ICD-10-CM | POA: Diagnosis not present

## 2021-06-23 DIAGNOSIS — I5023 Acute on chronic systolic (congestive) heart failure: Secondary | ICD-10-CM | POA: Diagnosis not present

## 2021-06-23 DIAGNOSIS — D638 Anemia in other chronic diseases classified elsewhere: Secondary | ICD-10-CM

## 2021-06-23 DIAGNOSIS — Z992 Dependence on renal dialysis: Secondary | ICD-10-CM | POA: Diagnosis not present

## 2021-06-23 LAB — CBC
HCT: 24.6 % — ABNORMAL LOW (ref 39.0–52.0)
Hemoglobin: 7.9 g/dL — ABNORMAL LOW (ref 13.0–17.0)
MCH: 28.9 pg (ref 26.0–34.0)
MCHC: 32.1 g/dL (ref 30.0–36.0)
MCV: 90.1 fL (ref 80.0–100.0)
Platelets: 153 10*3/uL (ref 150–400)
RBC: 2.73 MIL/uL — ABNORMAL LOW (ref 4.22–5.81)
RDW: 13.6 % (ref 11.5–15.5)
WBC: 11.2 10*3/uL — ABNORMAL HIGH (ref 4.0–10.5)
nRBC: 0 % (ref 0.0–0.2)

## 2021-06-23 LAB — BASIC METABOLIC PANEL
Anion gap: 5 (ref 5–15)
BUN: 34 mg/dL — ABNORMAL HIGH (ref 8–23)
CO2: 28 mmol/L (ref 22–32)
Calcium: 8.2 mg/dL — ABNORMAL LOW (ref 8.9–10.3)
Chloride: 102 mmol/L (ref 98–111)
Creatinine, Ser: 3.76 mg/dL — ABNORMAL HIGH (ref 0.61–1.24)
GFR, Estimated: 15 mL/min — ABNORMAL LOW (ref >60)
Glucose, Bld: 104 mg/dL — ABNORMAL HIGH (ref 70–99)
Potassium: 3.6 mmol/L (ref 3.5–5.1)
Sodium: 135 mmol/L (ref 135–145)

## 2021-06-23 MED ORDER — DOCUSATE SODIUM 100 MG PO CAPS
200.0000 mg | ORAL_CAPSULE | Freq: Two times a day (BID) | ORAL | Status: DC
  Administered 2021-06-23 – 2021-06-24 (×2): 200 mg via ORAL
  Filled 2021-06-23 (×2): qty 2

## 2021-06-23 MED ORDER — HEPARIN SODIUM (PORCINE) 1000 UNIT/ML IJ SOLN
INTRAMUSCULAR | Status: AC
  Administered 2021-06-23: 3800 [IU]
  Filled 2021-06-23: qty 10

## 2021-06-23 NOTE — Progress Notes (Signed)
Hemodialysis Post Treatment Note: ? ?Tx date:06/23/2021 ?Tx time:3 hours ?Access: Right CVC ?UF Removed: 1L ? ?Note: patient tolerated treatment well. No complications, pt asymptomatic.  ? ? ? ? ? ? ? ? ?  ?

## 2021-06-23 NOTE — Care Management Important Message (Signed)
Important Message ? ?Patient Details  ?Name: Peter [name] ?MRN: [id number] ?Date of Birth: 10/30/1940 ? ? ?Medicare Important Message Given:  Yes ? ?Patient out of room upon time of visit.  Copy of Medicare IM left on beside tray for reference.  ? ? ?[name] ?06/23/2021, 11:38 AM ?

## 2021-06-23 NOTE — Progress Notes (Signed)
Physical Therapy Treatment ?Patient Details ?Name: Peter [name] ?MRN: [id number] ?DOB: October 30, 1940 ?Today's Date: 06/23/2021 ? ? ?History of Present Illness 81 y.o. 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 Patient was recently hospitalized from 2/15 - 2/22 due to chest pain and non-STEMI. ? ?  ?PT Comments  ? ? Pt is sleeping in bed upon PT entrance into room, wakes to tactile and voice cue. He is agreeable to work w/ PT today. He is able to perform all bed mobility, can don/doff shoes at EOB, and perform sit to stand w/ ModI. Once standing, he relies on RW for energy conservation and to decrease c/o SOB. He was able to ambulate ~383f w/ SUPERVISION using RW and returns to resting in bed upon end of session. Pt will benefit from continued skilled PT in order to increase LE strength, improve mobility/endurance, and restore PLOF. Current discharge recommendation remains appropriate due to the level of assistance required by the patient to ensure safety and improve overall function. ?  ?Recommendations for follow up therapy are one component of a multi-disciplinary discharge planning process, led by the attending physician.  Recommendations may be updated based on patient status, additional functional criteria and insurance authorization. ? ?Follow Up Recommendations ? Home health PT ?  ?  ?Assistance Recommended at Discharge PRN  ?Patient can return home with the following A little help with walking and/or transfers;A little help with bathing/dressing/bathroom;Assistance with cooking/housework ?  ?Equipment Recommendations ? None recommended by PT  ?  ?Recommendations for Other Services   ? ? ?  ?Precautions / Restrictions Precautions ?Precautions:  Fall ?Precaution Comments: observe O2 and HR ?Restrictions ?Weight Bearing Restrictions: No  ?  ? ?Mobility ? Bed Mobility ?Overal bed mobility: Modified Independent ?  ?  ?  ?  ?  ?  ?General bed mobility comments: safe technique with ease of mobility ?  ? ?Transfers ?Overall transfer level: Modified independent ?Equipment used: Rolling walker (2 wheels) ?Transfers: Sit to/from Stand ?Sit to Stand: Modified independent (Device/Increase time) ?  ?  ?  ?  ?  ?General transfer comment: Able to rise to standing with relative ease, minimal reliance on UEs to push or on walker once upright ?  ? ?Ambulation/Gait ?Ambulation/Gait assistance: Supervision ?Gait Distance (Feet): 320 Feet ?Assistive device: Rolling walker (2 wheels) ?Gait Pattern/deviations: Step-through pattern, Decreased stride length ?Gait velocity: decreased ?  ?  ?  ? ? ?Stairs ?  ?  ?  ?  ?  ? ? ?Wheelchair Mobility ?  ? ?Modified Rankin (Stroke Patients Only) ?  ? ? ?  ?Balance Overall balance assessment: Needs assistance ?Sitting-balance support: Feet supported ?Sitting balance-Leahy Scale: Good ?  ?  ?Standing balance support: Bilateral upper extremity supported, During functional activity ?Standing balance-Leahy Scale: Fair ?  ?  ?  ?  ?  ?  ?  ?  ?  ?  ?  ?  ?  ? ?  ?Cognition Arousal/Alertness: Awake/alert ?Behavior During Therapy: [facility] tasks assessed/performed ?Overall Cognitive Status: Within Functional Limits for tasks assessed ?  ?  ?  ?  ?  ?  ?  ?  ?  ?  ?  ?  ?  ?  ?  ?  ?  ?  ?  ? ?  ?  Exercises   ? ?  ?General Comments   ?  ?  ? ?Pertinent Vitals/Pain Pain Assessment ?Pain Assessment: No/denies pain  ? ? ?Home Living   ?  ?  ?  ?  ?  ?  ?  ?  ?  ?   ?  ?Prior Function    ?  ?  ?   ? ?PT Goals (current goals can now be found in the care plan section) Progress towards PT goals: Progressing toward goals ? ?  ?Frequency ? ? ? Min 2X/week ? ? ? ?  ?PT Plan Current plan remains appropriate  ? ? ?Co-evaluation   ?  ?  ?  ?  ? ?  ?AM-PAC PT "6  Clicks" Mobility   ?Outcome Measure ? Help needed turning from your back to your side while in a flat bed without using bedrails?: None ?Help needed moving from lying on your back to sitting on the side of a flat bed without using bedrails?: None ?Help needed moving to and from a bed to a chair (including a wheelchair)?: None ?Help needed standing up from a chair using your arms (e.g., wheelchair or bedside chair)?: None ?Help needed to walk in hospital room?: A Little ?Help needed climbing 3-5 steps with a railing? : A Little ?6 Click Score: 22 ? ?  ?End of Session Equipment Utilized During Treatment: Gait belt;Oxygen ?Activity Tolerance: Patient tolerated treatment well ?Patient left: in bed;with call bell/phone within reach;with bed alarm set ?Nurse Communication: Mobility status ?PT Visit Diagnosis: Muscle weakness (generalized) (M62.81);Difficulty in walking, not elsewhere classified (R26.2) ?  ? ? ?Time: 2703-5009 ?PT Time Calculation (min) (ACUTE ONLY): 10 min ? ?Charges:             ?          ? ? ?Jonnie Kind, SPT ?06/23/2021, 3:40 PM ? ?

## 2021-06-23 NOTE — Progress Notes (Signed)
?PROGRESS NOTE ? ? ? ?Peter [name]  TKZ:[id number] DOB: August 04, 1940 DOA: 06/10/2021 ?PCP: Tracie Harrier, MD  ? ?Assessment & Plan: ?  ?Principal Problem: ?  Acute on chronic systolic CHF (congestive heart failure) ([place]) ?Active Problems: ?  CAD (coronary artery disease) ?  HTN (hypertension) ?  Hyperlipidemia ?  BPH (benign prostatic hyperplasia) ?  NSTEMI (non-ST elevated myocardial infarction) ([place]) ?  AKI (acute kidney injury) ([place]) ?  Normocytic anemia ?  HLD (hyperlipidemia) ?  CKD (chronic kidney disease), stage IV ([place]) ?  Chronic respiratory failure with hypoxia (HCC) ? ? ?Acute on chronic systolic CHF: compensated. Volume management w/ HD. Monitor I/Os  ? ?Elevated troponins: likely secondary to demand ischemia as per cardio.  ? 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 HD MWF as per nephro   ? ?Chronic hypoxic respiratory failure: continue on supplemental oxygen   ? ?Leukocytosis: labile.  ? ?HTN: continue on imdur, amlodipine & coreg  ? ?HLD: continue on statin  ? ?BPH: continue on home dose of finasteride  ? ?Depression: severity unknown. Continue on home dose of citalopram  ? ?Thrombocytopenia: WNL currently  ? ?ACD: likely secondary to CKD. Will transfuse if Hb< 7.0  ? ? ? ? ?DVT prophylaxis: SCDs ?Code Status:  full  ?Family Communication: discussed pt's care w/ pt's daughter, [name], and answered her questions  ?Disposition Plan: d/c home w/ HH  ? ?Level of care: Med-Surg ? ?Status is: Inpatient ?Remains inpatient appropriate because: medically stable but still needs outpatient HD spot  ? ? ? ?Consultants:  ?Nephro ?Vasc surg  ? ?Procedures:  ? ?Antimicrobials:  ? ? ?Subjective: ?Pt c/o malaise ? ?Objective: ?Vitals:  ? 06/22/21 1545 06/22/21 1953 06/22/21 2340 06/23/21 0421  ?BP: (!) 131/56 137/63 (!) 147/58 (!) 152/63  ?Pulse: 67 63 65 63  ?Resp: '19 17 17 17  '$ ?Temp: 98.2 ?F (36.8 ?C) 97.7 ?F (36.5 ?C) 97.7 ?F (36.5 ?C) 98 ?F (36.7 ?C)  ?TempSrc:  Oral Oral Oral  ?SpO2: 99% 98%  92% 97%  ?Weight:    90.6 kg  ?Height:      ? ? ?Intake/Output Summary (Last 24 hours) at 06/23/2021 0742 ?Last data filed at 06/22/2021 1300 ?Gross per 24 hour  ?Intake 720 ml  ?Output --  ?Net 720 ml  ? ?Filed Weights  ? 06/21/21 0700 06/22/21 0517 06/23/21 0421  ?Weight: 92 kg 90.4 kg 90.6 kg  ? ? ?Examination: ? ?General exam: Appears comfortable  ?Respiratory system: decreased breath sounds b/l. No wheezes or rales. ?Cardiovascular system: S1/S2+. No rubs or clicks ?Gastrointestinal system: Abd is soft, NT, ND & hypoactive bowel sounds ?Central nervous system: alert and awake. Moves all extremities  ?Psychiatry: judgement and insight appears at baseline. Flat mood and affect  ? ? ? ?Data Reviewed: I have personally reviewed following labs and imaging studies ? ?CBC: ?Recent Labs  ?Lab 06/19/21 ?0636 06/20/21 ?5732 06/21/21 ?0426 06/22/21 ?0402 06/23/21 ?2025  ?WBC 7.6 7.9 8.1 9.0 11.2*  ?HGB 8.0* 8.1* 8.4* 8.0* 7.9*  ?HCT 25.1* 25.0* 26.5* 25.3* 24.6*  ?MCV 89.6 89.0 90.4 89.4 90.1  ?PLT 171 153 153 165 153  ? ?Basic Metabolic Panel: ?Recent Labs  ?Lab 06/17/21 ?0616 06/18/21 ?4270 06/19/21 ?0636 06/22/21 ?0402 06/23/21 ?6237  ?NA 135 137 139 137 135  ?K 4.0 4.3 3.8 3.6 3.6  ?CL 105 108 106 101 102  ?CO2 19* 21* '24 28 28  '$ ?GLUCOSE 99 107* 108* 99 104*  ?BUN 88*  95* 70* 31* 34*  ?CREATININE 5.76* 5.88* 4.72* 3.16* 3.76*  ?CALCIUM 8.0* 8.0* 8.1* 8.3* 8.2*  ?PHOS  --   --  4.3  --   --   ? ?GFR: ?Estimated Creatinine Clearance: 17.7 mL/min (A) (by C-G formula based on SCr of 3.76 mg/dL (H)). ?Liver Function Tests: ?Recent Labs  ?Lab 06/19/21 ?0636  ?ALBUMIN 2.6*  ? ?No results for input(s): LIPASE, AMYLASE in the last 168 hours. ?No results for input(s): AMMONIA in the last 168 hours. ?Coagulation Profile: ?No results for input(s): INR, PROTIME in the last 168 hours. ?Cardiac Enzymes: ?No results for input(s): CKTOTAL, CKMB, CKMBINDEX, TROPONINI in the last 168 hours. ?BNP (last 3 results) ?No results for input(s):  PROBNP in the last 8760 hours. ?HbA1C: ?No results for input(s): HGBA1C in the last 72 hours. ?CBG: ?No results for input(s): GLUCAP in the last 168 hours. ?Lipid Profile: ?No results for input(s): CHOL, HDL, LDLCALC, TRIG, CHOLHDL, LDLDIRECT in the last 72 hours. ?Thyroid Function Tests: ?No results for input(s): TSH, T4TOTAL, FREET4, T3FREE, THYROIDAB in the last 72 hours. ?Anemia Panel: ?No results for input(s): VITAMINB12, FOLATE, FERRITIN, TIBC, IRON, RETICCTPCT in the last 72 hours. ?Sepsis Labs: ?No results for input(s): PROCALCITON, LATICACIDVEN in the last 168 hours. ? ?No results found for this or any previous visit (from the past 240 hour(s)). ?  ? ? ? ? ? ?Radiology Studies: ?No results found. ? ? ? ? ? ?Scheduled Meds: ? amLODipine  10 mg Oral Daily  ? aspirin EC  81 mg Oral Daily  ? atorvastatin  40 mg Oral q1800  ? calcitRIOL  0.25 mcg Oral Daily  ? carvedilol  6.25 mg Oral BID WC  ? Chlorhexidine Gluconate Cloth  6 each Topical Q0600  ? citalopram  20 mg Oral Daily  ? finasteride  5 mg Oral Daily  ? hydrALAZINE  100 mg Oral TID  ? isosorbide mononitrate  60 mg Oral Daily  ? pantoprazole  40 mg Oral Daily  ? sodium chloride flush  3 mL Intravenous Q12H  ? tamsulosin  0.4 mg Oral Daily  ? umeclidinium bromide  1 puff Inhalation Daily  ? cyanocobalamin  1,000 mcg Oral Daily  ? ?Continuous Infusions: ? ? LOS: 13 days  ? ? ?Time spent: 15 mins  ? ? ? ?Wyvonnia Dusky, MD ?[facility] ?Pager [phone] ? ?If 7PM-7AM, please contact night-coverage ?06/23/2021, 7:42 AM  ? ?

## 2021-06-23 NOTE — Progress Notes (Signed)
[facility]  ROUNDING NOTE   Subjective:   Peter [name] is a 81 year old male with past medical conditions including CAD, hypertension, asthma, hyperlipidemia, systolic heart failure, GERD, AAA repair, and chronic kidney disease stage IV.  Patient presents to the emergency department with complaints of shortness of breath.  Patient has been admitted for Shortness of breath [R06.02] SOB (shortness of breath) [R06.02] Elevated troponin [R77.8] Acute on chronic systolic CHF (congestive heart failure) (HCC) [I50.23] Congestive heart failure, unspecified HF chronicity, unspecified heart failure type [facility]) [I50.9]  Patient is known to our practice and is followed by Dr. Juleen China.  Patient recently had admission for the same complaint.  We have been consulted to monitor acute kidney injury.  Patient seen and evaluated during dialysis   HEMODIALYSIS FLOWSHEET:  Blood Flow Rate (mL/min): 400 mL/min Arterial Pressure (mmHg): -160 mmHg Venous Pressure (mmHg): 120 mmHg Transmembrane Pressure (mmHg): 60 mmHg Ultrafiltration Rate (mL/min): 500 mL/min Dialysate Flow Rate (mL/min): 500 ml/min Conductivity: Machine : 13.9 Conductivity: Machine : 13.9 Dialysis Fluid Bolus: Normal Saline Bolus Amount (mL): 250 mL Dialysate Change: Other (comment) (3k)  Currently seated in chair to receive treatment.  Tolerating well No complaints at this time    Objective:  Vital signs in last 24 hours:  Temp:  [97.6 F (36.4 C)-98.5 F (36.9 C)] 97.6 F (36.4 C) (03/13 1215) Pulse Rate:  [42-67] 58 (03/13 1215) Resp:  [16-37] 20 (03/13 1215) BP: (131-155)/(56-108) 146/63 (03/13 1215) SpO2:  [92 %-100 %] 99 % (03/13 1215) Weight:  [90.6 kg-91.5 kg] 91.2 kg (03/13 1225)  Weight change: 0.272 kg Filed Weights   06/23/21 0421 06/23/21 0912 06/23/21 1225  Weight: 90.6 kg 91.5 kg 91.2 kg    Intake/Output: I/O last 3 completed shifts: In: 840 [P.O.:840] Out: 300 [Urine:300]    Intake/Output this shift:  Total I/O In: -  Out: 1250 [Urine:250; Other:1000]  Physical Exam: General: No acute distress  Head: Moist oral mucosal membranes  Eyes: Anicteric  Lungs:  Diminished to auscultation bilaterally, normal effort  Heart: Regular rate and rhythm  Abdomen:  Soft, nontender, nondistended  Extremities: Trace peripheral edema.  Neurologic: Alert, awake,answers simple questions  Skin: No acute lesions or rashes  Access Right PermCath placed on 05/22/5186    Basic Metabolic Panel: Recent Labs  Lab 06/17/21 0616 06/18/21 0555 06/19/21 0636 06/22/21 0402 06/23/21 0549  NA 135 137 139 137 135  K 4.0 4.3 3.8 3.6 3.6  CL 105 108 106 101 102  CO2 19* 21* '24 28 28  '$ GLUCOSE 99 107* 108* 99 104*  BUN 88* 95* 70* 31* 34*  CREATININE 5.76* 5.88* 4.72* 3.16* 3.76*  CALCIUM 8.0* 8.0* 8.1* 8.3* 8.2*  PHOS  --   --  4.3  --   --      Liver Function Tests: Recent Labs  Lab 06/19/21 0636  ALBUMIN 2.6*    No results for input(s): LIPASE, AMYLASE in the last 168 hours. No results for input(s): AMMONIA in the last 168 hours.  CBC: Recent Labs  Lab 06/19/21 0636 06/20/21 0437 06/21/21 0426 06/22/21 0402 06/23/21 0549  WBC 7.6 7.9 8.1 9.0 11.2*  HGB 8.0* 8.1* 8.4* 8.0* 7.9*  HCT 25.1* 25.0* 26.5* 25.3* 24.6*  MCV 89.6 89.0 90.4 89.4 90.1  PLT 171 153 153 165 153     Cardiac Enzymes: No results for input(s): CKTOTAL, CKMB, CKMBINDEX, TROPONINI in the last 168 hours.  BNP: Invalid input(s): POCBNP  CBG: No results for input(s): GLUCAP in the  last 168 hours.  Microbiology: Results for orders placed or performed during the hospital encounter of 06/10/21  Resp Panel by RT-PCR (Flu A&B, Covid) Nasopharyngeal Swab     Status: None   Collection Time: 06/10/21  7:00 AM   Specimen: Nasopharyngeal Swab; Nasopharyngeal(NP) swabs in vial transport medium  Result Value Ref Range Status   SARS Coronavirus 2 by RT PCR NEGATIVE NEGATIVE Final    Comment:  (NOTE) SARS-CoV-2 target nucleic acids are NOT DETECTED.  The SARS-CoV-2 RNA is generally detectable in upper respiratory specimens during the acute phase of infection. The lowest concentration of SARS-CoV-2 viral copies this assay can detect is 138 copies/mL. A negative result does not preclude SARS-Cov-2 infection and should not be used as the sole basis for treatment or other patient management decisions. A negative result may occur with  improper specimen collection/handling, submission of specimen other than nasopharyngeal swab, presence of viral mutation(s) within the areas targeted by this assay, and inadequate number of viral copies(<138 copies/mL). A negative result must be combined with clinical observations, patient history, and epidemiological information. The expected result is Negative.  Fact Sheet for Patients:  EntrepreneurPulse.com.au  Fact Sheet for Healthcare Providers:  IncredibleEmployment.be  This test is no t yet approved or cleared by the [place] FDA and  has been authorized for detection and/or diagnosis of SARS-CoV-2 by FDA under an Emergency Use Authorization (EUA). This EUA will remain  in effect (meaning this test can be used) for the duration of the COVID-19 declaration under Section 564(b)(1) of the Act, 21 U.S.C.section 360bbb-3(b)(1), unless the authorization is terminated  or revoked sooner.       Influenza A by PCR NEGATIVE NEGATIVE Final   Influenza B by PCR NEGATIVE NEGATIVE Final    Comment: (NOTE) The Xpert Xpress SARS-CoV-2/FLU/RSV plus assay is intended as an aid in the diagnosis of influenza from Nasopharyngeal swab specimens and should not be used as a sole basis for treatment. Nasal washings and aspirates are unacceptable for Xpert Xpress SARS-CoV-2/FLU/RSV testing.  Fact Sheet for Patients: EntrepreneurPulse.com.au  Fact Sheet for Healthcare  Providers: IncredibleEmployment.be  This test is not yet approved or cleared by the [place] FDA and has been authorized for detection and/or diagnosis of SARS-CoV-2 by FDA under an Emergency Use Authorization (EUA). This EUA will remain in effect (meaning this test can be used) for the duration of the COVID-19 declaration under Section 564(b)(1) of the Act, 21 U.S.C. section 360bbb-3(b)(1), unless the authorization is terminated or revoked.  Performed at [facility], [address] [place], [place] [address]     Coagulation Studies: No results for input(s): LABPROT, INR in the last 72 hours.  Urinalysis: No results for input(s): COLORURINE, LABSPEC, PHURINE, GLUCOSEU, HGBUR, BILIRUBINUR, KETONESUR, PROTEINUR, UROBILINOGEN, NITRITE, LEUKOCYTESUR in the last 72 hours.  Invalid input(s): APPERANCEUR    Imaging: No results found.   Medications:       amLODipine  10 mg Oral Daily   aspirin EC  81 mg Oral Daily   atorvastatin  40 mg Oral q1800   calcitRIOL  0.25 mcg Oral Daily   carvedilol  6.25 mg Oral BID WC   Chlorhexidine Gluconate Cloth  6 each Topical Q0600   citalopram  20 mg Oral Daily   finasteride  5 mg Oral Daily   hydrALAZINE  100 mg Oral TID   isosorbide mononitrate  60 mg Oral Daily   pantoprazole  40 mg Oral Daily   sodium chloride flush  3 mL  Intravenous Q12H   tamsulosin  0.4 mg Oral Daily   umeclidinium bromide  1 puff Inhalation Daily   cyanocobalamin  1,000 mcg Oral Daily   acetaminophen, albuterol, dextromethorphan-guaiFENesin, hydrALAZINE, loratadine, nitroGLYCERIN, ondansetron (ZOFRAN) IV, sodium chloride  Assessment/ Plan:  [name] is a 81 y.o.  male male with past medical conditions including CAD, hypertension, asthma, hyperlipidemia, systolic heart failure, GERD, AAA repair, and chronic kidney disease stage IV.  Patient presents to the emergency department with complaints of shortness of breath.   Patient has been admitted for Shortness of breath [R06.02] SOB (shortness of breath) [R06.02] Elevated troponin [R77.8] Acute on chronic systolic CHF (congestive heart failure) (HCC) [I50.23] Congestive heart failure, unspecified HF chronicity, unspecified heart failure type ([place]) [I50.9]   End-stage renal disease requiring hemodialysis.     Considering current clinical presentation and declining renal function as outpatient, progression to end-stage renal disease is considered, he requires dialysis. Right chest PermCath by vascular placed on 06/17/2021. Dialysis initiated on 06/18/21.    Currently receiving dialysis seated in chair.  Tolerating treatment well.  UF goal 1 L as tolerated.  Decreased urine output recorded in preceding 24 hours.  Dialysis coordinator aware of patient and outpatient clinic placement in progress.   Lab Results  Component Value Date   CREATININE 3.76 (H) 06/23/2021   CREATININE 3.16 (H) 06/22/2021   CREATININE 4.72 (H) 06/19/2021    Intake/Output Summary (Last 24 hours) at 06/23/2021 1235 Last data filed at 06/23/2021 1215 Gross per 24 hour  Intake 480 ml  Output 1250 ml  Net -770 ml    2.  Hypertension with chronic kidney disease.  Home regimen includes carvedilol, furosemide, hydralazine, isosorbide, losartan and spironolactone.  Diuretics remain held.  Blood pressure 146/63 during dialysis  3.  Chronic systolic heart failure. Previous echo from prior admission shows EF 40 to 45% with mildly decreased left ventricular function and mild LVH.    Volume management via dialysis at this time.  4.Anemia of chronic disease Lab Results  Component Value Date   HGB 7.9 (L) 06/23/2021  Hemoglobin below target.  We will consider ESA.  5.Secondary hyperparathyroidism Lab Results  Component Value Date   CALCIUM 8.2 (L) 06/23/2021   PHOS 4.3 06/19/2021  Phosphorus 4.3 and acceptable.  Continue calcitriol.   LOS: [place] 3/13/202312:35 PM

## 2021-06-24 LAB — CBC
HCT: 25.4 % — ABNORMAL LOW (ref 39.0–52.0)
Hemoglobin: 8.1 g/dL — ABNORMAL LOW (ref 13.0–17.0)
MCH: 29.1 pg (ref 26.0–34.0)
MCHC: 31.9 g/dL (ref 30.0–36.0)
MCV: 91.4 fL (ref 80.0–100.0)
Platelets: 145 10*3/uL — ABNORMAL LOW (ref 150–400)
RBC: 2.78 MIL/uL — ABNORMAL LOW (ref 4.22–5.81)
RDW: 13.5 % (ref 11.5–15.5)
WBC: 9.1 10*3/uL (ref 4.0–10.5)
nRBC: 0 % (ref 0.0–0.2)

## 2021-06-24 LAB — BASIC METABOLIC PANEL
Anion gap: 8 (ref 5–15)
BUN: 22 mg/dL (ref 8–23)
CO2: 29 mmol/L (ref 22–32)
Calcium: 8 mg/dL — ABNORMAL LOW (ref 8.9–10.3)
Chloride: 97 mmol/L — ABNORMAL LOW (ref 98–111)
Creatinine, Ser: 2.69 mg/dL — ABNORMAL HIGH (ref 0.61–1.24)
GFR, Estimated: 23 mL/min — ABNORMAL LOW (ref >60)
Glucose, Bld: 89 mg/dL (ref 70–99)
Potassium: 3.6 mmol/L (ref 3.5–5.1)
Sodium: 134 mmol/L — ABNORMAL LOW (ref 135–145)

## 2021-06-24 MED ORDER — ASPIRIN 81 MG PO TBEC: 81.0000 mg | DELAYED_RELEASE_TABLET | Freq: Every day | ORAL | 0 refills | Status: AC

## 2021-06-24 NOTE — Discharge Summary (Signed)
Physician Discharge Summary  ?Peter [name] JXB:[id number] DOB: 1940-05-13 DOA: 06/10/2021 ? ?PCP: Tracie Harrier, MD ? ?Admit date: 06/10/2021 ?Discharge date: 06/24/2021 ? ?Admitted From: home  ?Disposition:  home w/ home health  ? ?Recommendations for Outpatient Follow-up:  ?Follow up with PCP in 1-2 weeks ?F/u w/ nephro, Dr. Candiss Norse, in 1-2 weeks ?F/u w/ cardio, Dr. Clayborn Bigness, in 1-2 weeks  ? ?Home Health: yes ?Equipment/Devices:  ? ?Discharge Condition: stable  ?CODE STATUS: full  ?Diet recommendation: Heart Healthy/2gm sodium diet  ? ?Brief/Interim Summary: ?HPI was taken from Dr. Blaine Hamper: ?Peter [name] is a 81 y.o. 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 ?  ?Patient was recently hospitalized from 2/15 - 2/22 due to chest pain and non-STEMI.  He also had black stool bowel movement, GI was consulted, EGD was done, which showed gastritis.  Patient's Plavix is on hold.  Since last night, he started having shortness of breath, which has been progressively worsening.  Patient has cough with clear mucus production.  Denies chest pain.  He has chills, but no fever.  He has nausea, no vomiting, diarrhea or abdominal pain.  No symptoms of UTI.  Denies rectal bleeding or dark stool today.  He states that he quit smoking more than 2 months ago. ?  ?Data Reviewed and ED Course: pt was found to have troponin 386, BNP 1783, negative COVID PCR, stable renal function, temperature 99, blood pressure 192/77, 162/68, heart rate 72, RR 19, oxygen saturation 96% on 2 L oxygen.  Chest x-ray negative.  Patient is admitted to PCU as inpatient. ?  ?As per Dr. Mal Misty: ?Peter [name] is a 81 y.o. male with medical history significant of sCHF with EF 40-45%, CAD with  multiple stent placement, hypertension, hyperlipidemia, asthma, as needed oxygen at home, GERD, depression, AAA repair, aortic arch aneurysm, former smoker (recently quit smoking), CKD-4, AICD placement. Patient was recently hospitalized from 2/15 - 2/22 due to chest pain and non-STEMI.  He also had black stool bowel movement, GI was consulted, EGD was done, which showed gastritis.  Patient's Plavix is on hold.    He presented to the hospital because of shortness of breath and cough. ? He was admitted to the hospital for acute on chronic systolic CHF. ?Troponins were also significantly elevated but this was attributed to demand ischemia.  He did not complain of any chest pain. ?  ?As per Dr. 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 Outpatient HD spot has been confirmed by nephro. D/c to ALF w/ home health.  ? ?Discharge Diagnoses:  ?Principal Problem: ?  Acute on chronic systolic CHF (congestive heart failure) ([place]) ?Active Problems: ?  CAD (coronary artery disease) ?  HTN (hypertension) ?  Hyperlipidemia ?  BPH (benign prostatic hyperplasia) ?  NSTEMI (non-ST elevated myocardial infarction) ([place]) ?  AKI (acute kidney injury) ([place]) ?  Normocytic anemia ?  HLD (hyperlipidemia) ?  CKD (chronic kidney disease), stage IV ([place]) ?  Chronic respiratory failure with hypoxia (HCC) ? ?Acute on chronic systolic CHF: compensated. Volume management w/ HD. Monitor I/Os ?  ?Elevated troponins: likely secondary to demand ischemia as per cardio.  ?  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  HD MWF as per nephro.  ?  ?Chronic hypoxic respiratory failure: continue on supplemental oxygen  ?  ?Leukocytosis: resolved ?  ?HTN: continue on home BP meds. Hold BB, CCB for MAP < 65 and/or HR <65 ?  ?HLD: continue on statin  ?  ?BPH: continue on home dose of finasteride  ?  ?Depression: severity unknown. Continue on home dose of citalopram  ?  ?Thrombocytopenia: chronic  and labile.   ?  ?ACD: likely secondary to CKD. H&H are labile  ? ?Discharge Instructions ? ?Discharge Instructions   ? ? Diet - low sodium heart healthy   Complete by: As directed ?  ? Discharge instructions   Complete by: As directed ?  ? F/u w/ PCP in 1-2 weeks. F/u w/ nephro, Dr. Murlean Iba, in 1-2 weeks. F/u w/ cardio, Dr. Clayborn Bigness, in 1-2 weeks  ? For home use only DME oxygen   Complete by: As directed ?  ? Length of Need: Lifetime  ? Mode or (Route): Nasal cannula  ? Liters per Minute: 2  ? Frequency: Continuous (stationary and portable oxygen unit needed)  ? Oxygen conserving device: Yes  ? Oxygen delivery system: Gas  ? Increase activity slowly   Complete by: As directed ?  ? ?  ? ?Allergies as of 06/24/2021   ? ?   Reactions  ? Iodinated Contrast Media Shortness Of Breath  ? Iodinated Glycerol  [glycerol, Iodinated] Shortness Of Breath  ? ?  ? ?  ?Medication List  ?  ? ?STOP taking these medications   ? ?cloNIDine 0.1 MG tablet ?Commonly known as: CATAPRES ?  ?furosemide 40 MG tablet ?Commonly known as: LASIX ?  ? ?  ? ?TAKE these medications   ? ?amLODipine 10 MG tablet ?Commonly known as: NORVASC ?Take 1 tablet (10 mg total) by mouth daily. ?  ?aspirin 81 MG EC tablet ?Take 1 tablet (81 mg total) by mouth daily. Swallow whole. ?Start taking on: June 25, 2021 ?  ?atorvastatin 40 MG tablet ?Commonly known as: LIPITOR ?Take 1 tablet (40 mg total) by mouth daily at 6 PM. ?  ?calcitRIOL 0.25 MCG capsule ?Commonly known as: ROCALTROL ?Take 0.25 mcg by mouth daily. ?  ?carvedilol 6.25 MG tablet ?Commonly known as: COREG ?Take 1 tablet (6.25 mg total) by mouth 2 (two) times daily with a meal. ?  ?cetirizine 10 MG tablet ?Commonly known as: ZYRTEC ?Take 10 mg by mouth. ?  ?citalopram 20 MG tablet ?Commonly known as: CELEXA ?Take 20 mg by mouth daily. ?  ?clobetasol cream 0.05 % ?Commonly known as: TEMOVATE ?Apply 1 application topically 2 (two) times daily as needed (psoriasis). ?  ?clopidogrel 75 MG  tablet ?Commonly known as: PLAVIX ?Hold until outpatient followup with cardiology due to worsening anemia. ?  ?cyanocobalamin 1000 MCG tablet ?Take 1,000 mcg by mouth daily. ?  ?finasteride 5 MG tablet ?Commonly known as: PROSCAR ?Take 1 tablet (5 mg total) by mouth daily. ?  ?GLUCOSAMINE-CHONDROITIN-VIT C PO ?Take 1 tablet by mouth daily. ?  ?hydrALAZINE 100 MG tablet ?Commonly known as: APRESOLINE ?Take 1 tablet (100 mg total) by mouth 3 (three) times daily. ?  ?isosorbide mononitrate 30 MG 24 hr tablet ?Commonly known as: IMDUR ?Take 1 tablet (30 mg total) by mouth daily. ?  ?omeprazole 20 MG capsule ?Commonly known as: PRILOSEC ?Take 20 mg by mouth in the morning. ?  ?spironolactone 25 MG tablet ?Commonly known as: ALDACTONE ?Hold until followup with your outpatient doctor due to worsening  kidney function. ?  ?tamsulosin 0.4 MG Caps capsule ?Commonly known as: FLOMAX ?Take 1 capsule (0.4 mg total) by mouth daily. ?  ?umeclidinium bromide 62.5 MCG/ACT Aepb ?Commonly known as: INCRUSE ELLIPTA ?Inhale 1 puff into the lungs daily. ?  ? ?  ? ?  ?  ? ? ?  ?Durable Medical Equipment  ?(From admission, onward)  ?  ? ? ?  ? ?  Start     Ordered  ? 06/11/21 0000  For home use only DME oxygen       ?Question Answer Comment  ?Length of Need Lifetime   ?Mode or (Route) Nasal cannula   ?Liters per Minute 2   ?Frequency Continuous (stationary and portable oxygen unit needed)   ?Oxygen conserving device Yes   ?Oxygen delivery system Gas   ?  ? 06/11/21 1416  ? ?  ?  ? ?  ? ? ?Allergies  ?Allergen Reactions  ? Iodinated Contrast Media Shortness Of Breath  ? Iodinated Glycerol  [Glycerol, Iodinated] Shortness Of Breath  ? ? ?Consultations: ?Nephro ?Vasc surg ?Cardio  ? ? ?Procedures/Studies: ?CT CHEST WO CONTRAST ? ?Result Date: 05/28/2021 ?CLINICAL DATA:  Shortness of breath. History of congestive heart failure. Substantial smoking history. EXAM: CT CHEST WITHOUT CONTRAST TECHNIQUE: Multidetector CT imaging of the chest was  performed following the standard protocol without IV contrast. RADIATION DOSE REDUCTION: This exam was performed according to the departmental dose-optimization program which includes automated exposure control, adjus

## 2021-06-24 NOTE — [place] FL2 (Signed)
?[place] MEDICAID FL2 LEVEL OF CARE SCREENING TOOL  ?  ? ?IDENTIFICATION  ?Patient Name: ?Peter [name] Birthdate: [birth year] Sex: male Admission Date (Current Location): ?06/10/2021  ?[place] and [place] Number: ? [id number] ?  Facility and Address:  ?[facility], [address] [place],  [address] ?     Provider Number: ?[id number]  ?Attending Physician Name and Address:  ?Wyvonnia Dusky, MD ? Relative Name and Phone Number:  ?  ?   ?Current Level of Care: ?Hospital Recommended Level of Care: ?[place] (with PT and OT) Prior Approval Number: ?  ? ?Date Approved/Denied: ?  PASRR Number: ?  ? ?Discharge Plan: ?Other (Comment) (ALF with PT and OT) ?  ? ?Current Diagnoses: ?Patient Active Problem List  ? Diagnosis Date Noted  ? Chronic respiratory failure with hypoxia ([place]) 06/11/2021  ? HLD (hyperlipidemia) 06/10/2021  ? CKD (chronic kidney disease), stage IV ([place]) 06/10/2021  ? Elevated troponin   ? Chest pain 05/28/2021  ? Acute exacerbation of CHF (congestive heart failure) ([place]) 04/27/2021  ? CKD (chronic kidney disease) stage 4, GFR 15-29 ml/min (HCC) 04/04/2021  ? 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 Acute anemia 04/04/2021  ? Thrombocytopenia ([place]) 04/04/2021  ? ABLA (acute blood loss anemia) 04/04/2021  ? AKI (acute kidney injury) ([place]) 04/04/2021  ? Normocytic anemia   ? 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 CKD (chronic kidney disease) stage 3, GFR 30-59 ml/min (HCC) 05/18/2018  ? Elevated hemoglobin A1c 05/18/2018  ? History of skin cancer 05/18/2018  ? Ischemic cardiomyopathy 12/16/2017  ? S/P ICD (internal cardiac defibrillator) procedure 12/16/2017  ? Acute on chronic systolic CHF (congestive heart failure) ([place]) 09/07/2016  ? Congestive heart failure ([place]) 01/17/2015  ? Current tobacco use 01/17/2015  ? Hypercholesterolemia  01/17/2015  ? Peripheral vascular disease ([place]) 01/17/2015  ? 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 Arthritis, degenerative 07/22/2013  ? H/O angina pectoris 07/22/2013  ? Adiposity 07/22/2013  ? 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 NSTEMI (non-ST elevated myocardial infarction) ([place]) 12/17/2011  ? Hypokalemia 12/15/2011  ? SOB (shortness of breath) 12/14/2011  ? CAD (coronary artery disease) 12/14/2011  ? HTN (hypertension) 12/14/2011  ? Hyperlipidemia 12/14/2011  ? GERD (gastroesophageal reflux disease) 12/14/2011  ? BPH (benign prostatic hyperplasia) 12/14/2011  ? 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 ? ?Orientation RESPIRATION BLADDER Height & Weight   ?  ?Self, Time, Situation, Place ? O2 (Nasal cannula 3 L) Continent Weight: 197 lb 6.4 oz (89.5 kg) ?Height:  '5\' 11"'$  (180.3 cm)  ?BEHAVIORAL SYMPTOMS/MOOD NEUROLOGICAL BOWEL NUTRITION STATUS  ? (None)  (None) Continent Diet (2 gram sodium)  ?AMBULATORY STATUS COMMUNICATION OF NEEDS Skin   ?Supervision Verbally Skin abrasions, Bruising ?  ?  ?  ?    ?     ?     ? ? ?Personal Care Assistance Level of Assistance  ?Bathing, Feeding, Dressing Bathing Assistance: Limited assistance ?Feeding assistance: Independent ?Dressing Assistance: Limited assistance ?   ? ?Functional Limitations Info  ?Sight, Hearing, Speech Sight Info: Adequate ?Hearing Info: Adequate ?Speech Info: Adequate  ? ? ?SPECIAL CARE FACTORS FREQUENCY  ?PT (By licensed PT), OT (By licensed OT)   ?  ?PT Frequency: 3 x week ?OT Frequency: 3 x week ?  ?  ?  ?   ? ? ?Contractures Contractures  Info: Not present  ? ? ?Additional Factors Info  ?Code Status, Allergies Code Status Info: Full code ?Allergies Info: Iodinated Contrast Media, Iodinated Glycerol (Glycerol, Iodinated) ?  ?  ?  ?   ? ?Current Medications (06/24/2021):  This is the current hospital active medication list ?Current Facility-Administered Medications  ?Medication  Dose Route Frequency Provider Last Rate Last Admin  ? acetaminophen (TYLENOL) tablet 650 mg  650 mg Oral Q6H PRN Schnier, Dolores Lory, MD      ? albuterol (PROVENTIL) (2.5 MG/3ML) 0.083% nebulizer solution 3 mL  3 mL Inhalation Q4H PRN Schnier, Dolores Lory, MD      ? amLODipine (NORVASC) tablet 10 mg  10 mg Oral Daily Schnier, Dolores Lory, MD   10 mg at 06/24/21 1004  ? aspirin EC tablet 81 mg  81 mg Oral Daily Schnier, Dolores Lory, MD   81 mg at 06/24/21 1004  ? atorvastatin (LIPITOR) tablet 40 mg  40 mg Oral q1800 Katha Cabal, MD   40 mg at 06/23/21 1827  ? calcitRIOL (ROCALTROL) capsule 0.25 mcg  0.25 mcg Oral Daily Schnier, Dolores Lory, MD   0.25 mcg at 06/24/21 1004  ? carvedilol (COREG) tablet 6.25 mg  6.25 mg Oral BID WC Schnier, Dolores Lory, MD   6.25 mg at 06/23/21 1827  ? Chlorhexidine Gluconate Cloth 2 % PADS 6 each  6 each Topical Q0600 Colon Flattery, NP   6 each at 06/24/21 0534  ? citalopram (CELEXA) tablet 20 mg  20 mg Oral Daily Schnier, Dolores Lory, MD   20 mg at 06/24/21 1004  ? dextromethorphan-guaiFENesin (MUCINEX DM) 30-600 MG per 12 hr tablet 1 tablet  1 tablet Oral BID PRN Schnier, Dolores Lory, MD   1 tablet at 06/17/21 2637  ? docusate sodium (COLACE) capsule 200 mg  200 mg Oral BID Wyvonnia Dusky, MD   200 mg at 06/24/21 1004  ? finasteride (PROSCAR) tablet 5 mg  5 mg Oral Daily Schnier, Dolores Lory, MD   5 mg at 06/24/21 1004  ? hydrALAZINE (APRESOLINE) injection 5 mg  5 mg Intravenous Q2H PRN Schnier, Dolores Lory, MD      ? hydrALAZINE (APRESOLINE) tablet 100 mg  100 mg Oral TID Delana Meyer Dolores Lory, MD   100 mg at 06/24/21 1004  ? isosorbide mononitrate (IMDUR) 24 hr tablet 60 mg  60 mg Oral Daily Schnier, Dolores Lory, MD   60 mg at 06/24/21 1004  ? loratadine (CLARITIN) tablet 10 mg  10 mg Oral Daily PRN Schnier, Dolores Lory, MD      ? nitroGLYCERIN (NITROSTAT) SL tablet 0.4 mg  0.4 mg Sublingual Q5 min PRN Schnier, Dolores Lory, MD      ? ondansetron [facility]) injection 4 mg  4 mg Intravenous Q8H PRN  Schnier, Dolores Lory, MD      ? pantoprazole (PROTONIX) EC tablet 40 mg  40 mg Oral Daily Schnier, Dolores Lory, MD   40 mg at 06/24/21 1004  ? sodium chloride (OCEAN) 0.65 % nasal spray 1 spray  1 spray Each Nare PRN Schnier, Dolores Lory, MD      ? sodium chloride flush (NS) 0.9 % injection 3 mL  3 mL Intravenous Q12H Schnier, Dolores Lory, MD   3 mL at 06/20/21 2200  ? tamsulosin (FLOMAX) capsule 0.4 mg  0.4 mg Oral Daily Schnier, Dolores Lory, MD   0.4 mg at 06/24/21 1004  ? umeclidinium bromide (INCRUSE ELLIPTA) 62.5 MCG/ACT 1 puff  1 puff Inhalation Daily  Schnier, Dolores Lory, MD   1 puff at 06/24/21 1005  ? vitamin B-12 (CYANOCOBALAMIN) tablet 1,000 mcg  1,000 mcg Oral Daily Schnier, Dolores Lory, MD   1,000 mcg at 06/24/21 1004  ? ? ? ?Discharge Medications: ?STOP taking these medications   ?  ?cloNIDine 0.1 MG tablet ?Commonly known as: CATAPRES ?   ?furosemide 40 MG tablet ?Commonly known as: LASIX ?   ?  ?   ?  ?TAKE these medications   ?  ?amLODipine 10 MG tablet ?Commonly known as: NORVASC ?Take 1 tablet (10 mg total) by mouth daily. ?   ?aspirin 81 MG EC tablet ?Take 1 tablet (81 mg total) by mouth daily. Swallow whole. ?Start taking on: June 25, 2021 ?   ?atorvastatin 40 MG tablet ?Commonly known as: LIPITOR ?Take 1 tablet (40 mg total) by mouth daily at 6 PM. ?   ?calcitRIOL 0.25 MCG capsule ?Commonly known as: ROCALTROL ?Take 0.25 mcg by mouth daily. ?   ?carvedilol 6.25 MG tablet ?Commonly known as: COREG ?Take 1 tablet (6.25 mg total) by mouth 2 (two) times daily with a meal. ?   ?cetirizine 10 MG tablet ?Commonly known as: ZYRTEC ?Take 10 mg by mouth. ?   ?citalopram 20 MG tablet ?Commonly known as: CELEXA ?Take 20 mg by mouth daily. ?   ?clobetasol cream 0.05 % ?Commonly known as: TEMOVATE ?Apply 1 application topically 2 (two) times daily as needed (psoriasis). ?   ?clopidogrel 75 MG tablet ?Commonly known as: PLAVIX ?Hold until outpatient followup with cardiology due to worsening anemia. ?   ?cyanocobalamin  1000 MCG tablet ?Take 1,000 mcg by mouth daily. ?   ?finasteride 5 MG tablet ?Commonly known as: PROSCAR ?Take 1 tablet (5 mg total) by mouth daily. ?   ?GLUCOSAMINE-CHONDROITIN-VIT C PO ?Take 1 tablet by mou

## 2021-06-24 NOTE — Progress Notes (Signed)
?Peter [name]  ?ROUNDING NOTE  ? ?Subjective:  ? ?Peter [name] is a 81 year old male with past medical conditions including CAD, hypertension, asthma, hyperlipidemia, systolic heart failure, GERD, AAA repair, and chronic [name] disease stage IV.  Patient presents to the emergency department with complaints of shortness of breath.  Patient has been admitted for Shortness of breath [R06.02] ?SOB (shortness of breath) [R06.02] ?Elevated troponin [R77.8] ?Acute on chronic systolic CHF (congestive heart failure) ([place]) [I50.23] ?Congestive heart failure, unspecified HF chronicity, unspecified heart failure type ([place]) [I50.9] ? ?Patient is known to our practice and is followed by Dr. Juleen China.  Patient recently had admission for the same complaint.  We have been consulted to monitor acute [name] injury. ? ? ?Patient seen resting quietly in bed, alert with improved confusion ?Continues to complain of poor appetite but is drinking 1 Ensure per day ?Denies shortness of breath, able to ambulate with physical therapy with standby assistance. ?  ? ?Objective:  ?Vital signs in last 24 hours:  ?Temp:  [97.4 ?F (36.3 ?C)-98 ?F (36.7 ?C)] 97.4 ?F (36.3 ?C) (03/14 1159) ?Pulse Rate:  [52-63] 56 (03/14 1159) ?Resp:  [17-20] 18 (03/14 1159) ?BP: (123-138)/(49-71) 123/49 (03/14 1159) ?SpO2:  [96 %-100 %] 97 % (03/14 1159) ?Weight:  [89.5 kg] 89.5 kg (03/14 0533) ? ?Weight change: 0.871 kg ?Filed Weights  ? 06/23/21 0912 06/23/21 1225 06/24/21 0533  ?Weight: 91.5 kg 91.2 kg 89.5 kg  ? ? ?Intake/Output: ?I/O last 3 completed shifts: ?In: 720 [P.O.:720] ?Out: 1350 [Urine:350; Other:1000] ?  ?Intake/Output this shift: ? No intake/output data recorded. ? ?Physical Exam: ?General: No acute distress  ?Head: Moist oral mucosal membranes  ?Eyes: Anicteric  ?Lungs:  Diminished to auscultation bilaterally, normal effort  ?Heart: Regular rate and rhythm  ?Abdomen:  Soft, nontender, nondistended  ?Extremities: Trace peripheral edema.   ?Neurologic: Alert, awake,answers simple questions  ?Skin: No acute lesions or rashes  ?Access Right PermCath placed on 06/17/2021  ? ? ?Basic Metabolic Panel: ?Recent Labs  ?Lab 06/18/21 ?5053 06/19/21 ?0636 06/22/21 ?0402 06/23/21 ?9767 06/24/21 ?0601  ?NA 137 139 137 135 134*  ?K 4.3 3.8 3.6 3.6 3.6  ?CL 108 106 101 102 97*  ?CO2 21* '24 28 28 29  '$ ?GLUCOSE 107* 108* 99 104* 89  ?BUN 95* 70* 31* 34* 22  ?CREATININE 5.88* 4.72* 3.16* 3.76* 2.69*  ?CALCIUM 8.0* 8.1* 8.3* 8.2* 8.0*  ?PHOS  --  4.3  --   --   --   ? ? ? ?Liver Function Tests: ?Recent Labs  ?Lab 06/19/21 ?0636  ?ALBUMIN 2.6*  ? ? ?No results for input(s): LIPASE, AMYLASE in the last 168 hours. ?No results for input(s): AMMONIA in the last 168 hours. ? ?CBC: ?Recent Labs  ?Lab 06/20/21 ?3419 06/21/21 ?0426 06/22/21 ?0402 06/23/21 ?3790 06/24/21 ?0601  ?WBC 7.9 8.1 9.0 11.2* 9.1  ?HGB 8.1* 8.4* 8.0* 7.9* 8.1*  ?HCT 25.0* 26.5* 25.3* 24.6* 25.4*  ?MCV 89.0 90.4 89.4 90.1 91.4  ?PLT 153 153 165 153 145*  ? ? ? ?Cardiac Enzymes: ?No results for input(s): CKTOTAL, CKMB, CKMBINDEX, TROPONINI in the last 168 hours. ? ?BNP: ?Invalid input(s): POCBNP ? ?CBG: ?No results for input(s): GLUCAP in the last 168 hours. ? ?Microbiology: ?Results for orders placed or performed during the hospital encounter of 06/10/21  ?Resp Panel by RT-PCR (Flu A&B, Covid) Nasopharyngeal Swab     Status: None  ? Collection Time: 06/10/21  7:00 AM  ? Specimen: Nasopharyngeal Swab; Nasopharyngeal(NP) swabs in vial  transport medium  ?Result Value Ref Range Status  ? SARS Coronavirus 2 by RT PCR NEGATIVE NEGATIVE Final  ?  Comment: (NOTE) ?SARS-CoV-2 target nucleic acids are NOT DETECTED. ? ?The SARS-CoV-2 RNA is generally detectable in upper respiratory ?specimens during the acute phase of infection. The lowest ?concentration of SARS-CoV-2 viral copies this assay can detect is ?138 copies/mL. A negative result does not preclude SARS-Cov-2 ?infection and should not be used as the sole basis  for treatment or ?other patient management decisions. A negative result may occur with  ?improper specimen collection/handling, submission of specimen other ?than nasopharyngeal swab, presence of viral mutation(s) within the ?areas targeted by this assay, and inadequate number of viral ?copies(<138 copies/mL). A negative result must be combined with ?clinical observations, patient history, and epidemiological ?information. The expected result is Negative. ? ?Fact Sheet for Patients:  ?EntrepreneurPulse.com.au ? ?Fact Sheet for Healthcare Providers:  ?IncredibleEmployment.be ? ?This test is no t yet approved or cleared by the [place] FDA and  ?has been authorized for detection and/or diagnosis of SARS-CoV-2 by ?FDA under an Emergency Use Authorization (EUA). This EUA will remain  ?in effect (meaning this test can be used) for the duration of the ?COVID-19 declaration under Section 564(b)(1) of the Act, 21 ?U.S.C.section 360bbb-3(b)(1), unless the authorization is terminated  ?or revoked sooner.  ? ? ?  ? Influenza A by PCR NEGATIVE NEGATIVE Final  ? Influenza B by PCR NEGATIVE NEGATIVE Final  ?  Comment: (NOTE) ?The Xpert Xpress SARS-CoV-2/FLU/RSV plus assay is intended as an aid ?in the diagnosis of influenza from Nasopharyngeal swab specimens and ?should not be used as a sole basis for treatment. Nasal washings and ?aspirates are unacceptable for Xpert Xpress SARS-CoV-2/FLU/RSV ?testing. ? ?Fact Sheet for Patients: ?EntrepreneurPulse.com.au ? ?Fact Sheet for Healthcare Providers: ?IncredibleEmployment.be ? ?This test is not yet approved or cleared by the [place] FDA and ?has been authorized for detection and/or diagnosis of SARS-CoV-2 by ?FDA under an Emergency Use Authorization (EUA). This EUA will remain ?in effect (meaning this test can be used) for the duration of the ?COVID-19 declaration under Section 564(b)(1) of the Act, 21  U.S.C. ?section 360bbb-3(b)(1), unless the authorization is terminated or ?revoked. ? ?Performed at [facility], [place], ?[place] [address] ?  ? ? ?Coagulation Studies: ?No results for input(s): LABPROT, INR in the last 72 hours. ? ?Urinalysis: ?No results for input(s): COLORURINE, LABSPEC, [place], GLUCOSEU, HGBUR, BILIRUBINUR, KETONESUR, PROTEINUR, UROBILINOGEN, NITRITE, LEUKOCYTESUR in the last 72 hours. ? ?Invalid input(s): APPERANCEUR  ? ? ?Imaging: ?No results found. ? ? ?Medications:  ? ? ? ? ? amLODipine  10 mg Oral Daily  ? aspirin EC  81 mg Oral Daily  ? atorvastatin  40 mg Oral q1800  ? calcitRIOL  0.25 mcg Oral Daily  ? carvedilol  6.25 mg Oral BID WC  ? Chlorhexidine Gluconate Cloth  6 each Topical Q0600  ? citalopram  20 mg Oral Daily  ? docusate sodium  200 mg Oral BID  ? finasteride  5 mg Oral Daily  ? hydrALAZINE  100 mg Oral TID  ? isosorbide mononitrate  60 mg Oral Daily  ? pantoprazole  40 mg Oral Daily  ? sodium chloride flush  3 mL Intravenous Q12H  ? tamsulosin  0.4 mg Oral Daily  ? umeclidinium bromide  1 puff Inhalation Daily  ? cyanocobalamin  1,000 mcg Oral Daily  ? ?acetaminophen, albuterol, dextromethorphan-guaiFENesin, hydrALAZINE, loratadine, nitroGLYCERIN, ondansetron (ZOFRAN) IV, sodium chloride ? ?  Assessment/ Plan:  ?Peter [name] is a 81 y.o.  male male with past medical conditions including CAD, hypertension, asthma, hyperlipidemia, systolic heart failure, GERD, AAA repair, and chronic [name] disease stage IV.  Patient presents to the emergency department with complaints of shortness of breath.  Patient has been admitted for Shortness of breath [R06.02] ?SOB (shortness of breath) [R06.02] ?Elevated troponin [R77.8] ?Acute on chronic systolic CHF (congestive heart failure) ([place]) [I50.23] ?Congestive heart failure, unspecified HF chronicity, unspecified heart failure type ([place]) [I50.9] ? ? ?End-stage renal disease requiring hemodialysis. ?     Considering current clinical presentation and declining renal function as outpatient, progression to end-stage renal disease is considered, he requires dialysis. Right chest PermCath by vascular placed on 03/0

## 2021-06-24 NOTE — TOC Transition Note (Signed)
Transition of Care (TOC) - CM/SW Discharge Note ? ? ?Patient Details  ?Name: Peter [name] ?MRN: [id number] ?Date of Birth: 1940-11-05 ? ?Transition of Care (TOC) CM/SW Contact:  ?Candie Chroman, LCSW ?Phone Number: ?06/24/2021, 2:35 PM ? ? ?Clinical Narrative: Patient has orders to discharge to [facility] today. Daughter will transport to the facility. RN does not need to call report. No further concerns. CSW signing off.   ? ?Final next level of care: Assisted Living (with home health) ?Barriers to Discharge: Barriers Resolved ? ? ?Patient Goals and CMS Choice ?  ?  ?Choice offered to / list presented to : NA ? ?Discharge Placement ?  ?           ?  ?Patient to be transferred to facility by: Daughter ?Name of family member notified: Julian Reil ?Patient and family notified of of transfer: 06/24/21 ? ?Discharge Plan and Services ?  ?  ?Post Acute Care Choice: Resumption of Svcs/PTA Provider          ?  ?  ?  ?  ?  ?HH Arranged: PT, OT ?[place] Agency: [facility] ?Date HH Agency Contacted: 06/24/21 ?  ?Representative spoke with at [place]: Marisue [place] ? ?Social Determinants of Health (SDOH) Interventions ?  ? ? ?Readmission Risk Interventions ?Readmission Risk Prevention Plan 06/11/2021 04/28/2021  ?Transportation Screening Complete Complete  ?PCP or Specialist Appt within 3-5 Days - Complete  ?[place] or Home Care Consult - Complete  ?Social Work Consult for [place] Planning/Counseling - Complete  ?Palliative Care Screening - Not Applicable  ?Medication Review Press photographer) Complete Complete  ?PCP or Specialist appointment within 3-5 days of discharge Complete -  ?[place] or Home Care Consult Complete -  ?SW Recovery Care/Counseling Consult Complete -  ?Palliative Care Screening Not Applicable -  ?[place] Not Applicable -  ?Some recent data might be hidden  ? ? ? ? ? ?

## 2021-06-24 NOTE — Progress Notes (Signed)
Assumed care of pt at 1900. A&O x4. On 3.5 L [place]. No c/o pain overnight. Full assessment and vitals per flowsheet. Medication administration per MAR. Updated on POC w/ verbalized understanding, however, requiring frequent reinforcement. Comfort and safety maintained.  ?

## 2021-06-24 NOTE — TOC Progression Note (Signed)
Transition of Care (TOC) - Progression Note  ? ? ?Patient Details  ?Name: Peter [name] ?MRN: [id number] ?Date of Birth: September 30, 1940 ? ?Transition of Care (TOC) CM/SW Contact  ?Candie Chroman, LCSW ?Phone Number: ?06/24/2021, 1:45 PM ? ?Clinical Narrative:   Faxed FL2 and discharge summary to ALF for review.  ? ?Expected Discharge Plan: [place] ?Barriers to Discharge: Continued Medical Work up ? ?Expected Discharge Plan and Services ?Expected Discharge Plan: [place] ?  ?  ?Post Acute Care Choice: Resumption of Svcs/PTA Provider ?Living arrangements for the past 2 months: [place] ?Expected Discharge Date: 06/24/21               ?  ?  ?  ?  ?  ?HH Arranged: PT ?[place] Agency: [place] ([place]) ?Date HH Agency Contacted: 06/11/21 ?  ?Representative spoke with at [place]: Floydene Flock ? ? ?Social Determinants of Health (SDOH) Interventions ?  ? ?Readmission Risk Interventions ?Readmission Risk Prevention Plan 06/11/2021 04/28/2021  ?Transportation Screening Complete Complete  ?PCP or Specialist Appt within 3-5 Days - Complete  ?[place] or Home Care Consult - Complete  ?Social Work Consult for [place] Planning/Counseling - Complete  ?Palliative Care Screening - Not Applicable  ?Medication Review Press photographer) Complete Complete  ?PCP or Specialist appointment within 3-5 days of discharge Complete -  ?[place] or Home Care Consult Complete -  ?SW Recovery Care/Counseling Consult Complete -  ?Palliative Care Screening Not Applicable -  ?[place] Not Applicable -  ?Some recent data might be hidden  ? ? ?

## 2021-06-24 NOTE — Progress Notes (Signed)
PLACEMENT RESOLVED: ?[place] MWF 11:40am. ?Start date: 3/15 @ 11:00am ?

## 2021-06-24 NOTE — Progress Notes (Signed)
?PROGRESS NOTE ? ? ?HPI was taken from Dr. Blaine Hamper: ?Peter [name] is a 81 y.o. 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 ?  ?Patient was recently hospitalized from 2/15 - 2/22 due to chest pain and non-STEMI.  He also had black stool bowel movement, GI was consulted, EGD was done, which showed gastritis.  Patient's Plavix is on hold.  Since last night, he started having shortness of breath, which has been progressively worsening.  Patient has cough with clear mucus production.  Denies chest pain.  He has chills, but no fever.  He has nausea, no vomiting, diarrhea or abdominal pain.  No symptoms of UTI.  Denies rectal bleeding or dark stool today.  He states that he quit smoking more than 2 months ago. ?  ?Data Reviewed and ED Course: pt was found to have troponin 386, BNP 1783, negative COVID PCR, stable renal function, temperature 99, blood pressure 192/77, 162/68, heart rate 72, RR 19, oxygen saturation 96% on 2 L oxygen.  Chest x-ray negative.  Patient is admitted to PCU as inpatient. ? ?As per Dr. Mal Misty: ?Peter [name] is a 81 y.o. male with medical history significant of sCHF with EF 40-45%, CAD with multiple stent placement, hypertension, hyperlipidemia, asthma, as needed oxygen at home, GERD, depression, AAA repair, aortic arch aneurysm, former smoker (recently quit smoking), CKD-4, AICD placement. Patient was recently hospitalized from 2/15 - 2/22 due to chest pain and non-STEMI.  He also had black stool bowel movement, GI was consulted, EGD was done, which showed gastritis.  Patient's Plavix is on hold.    He presented to the hospital because of shortness of breath and cough. ? He was admitted to the hospital for acute on chronic systolic  CHF. ?Troponins were also significantly elevated but this was attributed to demand ischemia.  He did not complain of any chest pain. ? ?As per Dr. 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 Pt is still waiting on outpatient HD spot so that pt can be d/c to his ALF w/ home health. For more information, please see previous progress/consult notes. ? ? ?Peter [name]  XHB:[id number] DOB: 1940-09-22 DOA: 06/10/2021 ?PCP: Tracie Harrier, MD  ? ?Assessment & Plan: ?  ?Principal Problem: ?  Acute on chronic systolic CHF (congestive heart failure) ([place]) ?Active Problems: ?  CAD (coronary artery disease) ?  HTN (hypertension) ?  Hyperlipidemia ?  BPH (benign prostatic hyperplasia) ?  NSTEMI (non-ST elevated myocardial infarction) ([place]) ?  AKI (acute kidney injury) ([place]) ?  Normocytic anemia ?  HLD (hyperlipidemia) ?  CKD (chronic kidney disease), stage IV ([place]) ?  Chronic respiratory failure with hypoxia (H[facility]) ? ? ?Acute on chronic systolic CHF: compensated. Volume management w/ HD. Monitor I/Os ? ?Elevated troponins: likely secondary to demand ischemia as per cardio.  ? 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 HD MWF as per nephro. Still waiting on outpatient HD spot to be confirmed  ? ?Chronic hypoxic respiratory failure: continue on supplemental oxygen  ? ?Leukocytosis: labile.  ? ?HTN: continue on coreg, imdur & amlodipine. Hold BB, CCB for MAP,< 65 and/or HR <65  ? ?HLD: continue on statin  ? ?BPH: continue on home dose  of finasteride  ? ?Depression: severity unknown. Continue on home dose of citalopram  ? ?Thrombocytopenia: chronic and labile.   ? ?ACD: likely secondary to CKD. H&H are labile  ? ? ? ? ?DVT prophylaxis: SCDs ?Code Status:  full  ?Family Communication: ?Disposition Plan: d/c home w/ HH  ? ?Level of care: Med-Surg ? ?Status is: Inpatient ?Remains inpatient appropriate because: medically stable but still needs outpatient HD spot   ? ? ? ?Consultants:  ?Nephro ?Vasc surg  ? ?Procedures:  ? ?Antimicrobials:  ? ? ?Subjective: ?Pt denies any complaints  ? ?Objective: ?Vitals:  ? 06/23/21 1925 06/23/21 2343 06/24/21 0404 06/24/21 0533  ?BP: (!) 123/57 137/61 137/71   ?Pulse: 63 (!) 57 60   ?Resp: '18 17 20   '$ ?Temp: 98 ?F (36.7 ?C) 98 ?F (36.7 ?C) (!) 97.5 ?F (36.4 ?C)   ?TempSrc: Oral Oral Oral   ?SpO2: 100% 97% 96%   ?Weight:    89.5 kg  ?Height:      ? ? ?Intake/Output Summary (Last 24 hours) at 06/24/2021 0745 ?Last data filed at 06/24/2021 0532 ?Gross per 24 hour  ?Intake 720 ml  ?Output 1350 ml  ?Net -630 ml  ? ?Filed Weights  ? 06/23/21 0912 06/23/21 1225 06/24/21 0533  ?Weight: 91.5 kg 91.2 kg 89.5 kg  ? ? ?Examination: ? ?General exam: Appears calm & comfortable  ?Respiratory system: decreased breath sounds b/l  ?Cardiovascular system: S1 & S2+. No rubs or gallops  ?Gastrointestinal system: Abd is soft, NT, ND & hypoactive bowel sounds ?Central nervous system: Alert and awake. Moves all extremities  ?Psychiatry: judgement and insight appears normal. Flat mood and affect ? ? ? ?Data Reviewed: I have personally reviewed following labs and imaging studies ? ?CBC: ?Recent Labs  ?Lab 06/20/21 ?0350 06/21/21 ?0426 06/22/21 ?0402 06/23/21 ?0938 06/24/21 ?0601  ?WBC 7.9 8.1 9.0 11.2* 9.1  ?HGB 8.1* 8.4* 8.0* 7.9* 8.1*  ?HCT 25.0* 26.5* 25.3* 24.6* 25.4*  ?MCV 89.0 90.4 89.4 90.1 91.4  ?PLT 153 153 165 153 145*  ? ?Basic Metabolic Panel: ?Recent Labs  ?Lab 06/18/21 ?1829 06/19/21 ?0636 06/22/21 ?0402 06/23/21 ?9371 06/24/21 ?0601  ?NA 137 139 137 135 134*  ?K 4.3 3.8 3.6 3.6 3.6  ?CL 108 106 101 102 97*  ?CO2 21* '24 28 28 29  '$ ?GLUCOSE 107* 108* 99 104* 89  ?BUN 95* 70* 31* 34* 22  ?CREATININE 5.88* 4.72* 3.16* 3.76* 2.69*  ?CALCIUM 8.0* 8.1* 8.3* 8.2* 8.0*  ?PHOS  --  4.3  --   --   --   ? ?GFR: ?Estimated Creatinine Clearance: 22.9 mL/min (A) (by C-G formula based on SCr of 2.69 mg/dL (H)). ?Liver Function Tests: ?Recent Labs  ?Lab 06/19/21 ?0636   ?ALBUMIN 2.6*  ? ?No results for input(s): LIPASE, AMYLASE in the last 168 hours. ?No results for input(s): AMMONIA in the last 168 hours. ?Coagulation Profile: ?No results for input(s): INR, PROTIME in the last 168 hours. ?Cardiac Enzymes: ?No results for input(s): CKTOTAL, CKMB, CKMBINDEX, TROPONINI in the last 168 hours. ?BNP (last 3 results) ?No results for input(s): PROBNP in the last 8760 hours. ?HbA1C: ?No results for input(s): HGBA1C in the last 72 hours. ?CBG: ?No results for input(s): GLUCAP in the last 168 hours. ?Lipid Profile: ?No results for input(s): CHOL, HDL, LDLCALC, TRIG, CHOLHDL, LDLDIRECT in the last 72 hours. ?Thyroid Function Tests: ?No results for input(s): TSH, T4TOTAL, FREET4, T3FREE, THYROIDAB in the last 72 hours. ?Anemia Panel: ?No results for input(s): VITAMINB12,  FOLATE, FERRITIN, TIBC, IRON, RETICCTPCT in the last 72 hours. ?Sepsis Labs: ?No results for input(s): PROCALCITON, LATICACIDVEN in the last 168 hours. ? ?No results found for this or any previous visit (from the past 240 hour(s)). ?  ? ? ? ? ? ?Radiology Studies: ?No results found. ? ? ? ? ? ?Scheduled Meds: ? amLODipine  10 mg Oral Daily  ? aspirin EC  81 mg Oral Daily  ? atorvastatin  40 mg Oral q1800  ? calcitRIOL  0.25 mcg Oral Daily  ? carvedilol  6.25 mg Oral BID WC  ? Chlorhexidine Gluconate Cloth  6 each Topical Q0600  ? citalopram  20 mg Oral Daily  ? docusate sodium  200 mg Oral BID  ? finasteride  5 mg Oral Daily  ? hydrALAZINE  100 mg Oral TID  ? isosorbide mononitrate  60 mg Oral Daily  ? pantoprazole  40 mg Oral Daily  ? sodium chloride flush  3 mL Intravenous Q12H  ? tamsulosin  0.4 mg Oral Daily  ? umeclidinium bromide  1 puff Inhalation Daily  ? cyanocobalamin  1,000 mcg Oral Daily  ? ?Continuous Infusions: ? ? LOS: 14 days  ? ? ?Time spent: 15 mins  ? ? ? ?Wyvonnia Dusky, MD ?[facility] ?Pager [phone] ? ?If 7PM-7AM, please contact night-coverage ?06/24/2021, 7:45 AM  ? ?

## 2021-06-25 DIAGNOSIS — D509 Iron deficiency anemia, unspecified: Secondary | ICD-10-CM | POA: Diagnosis not present

## 2021-06-25 DIAGNOSIS — D631 Anemia in chronic kidney disease: Secondary | ICD-10-CM | POA: Diagnosis not present

## 2021-06-25 DIAGNOSIS — N186 End stage renal disease: Secondary | ICD-10-CM | POA: Diagnosis not present

## 2021-06-25 DIAGNOSIS — Z992 Dependence on renal dialysis: Secondary | ICD-10-CM | POA: Diagnosis not present

## 2021-06-26 DIAGNOSIS — I509 Heart failure, unspecified: Secondary | ICD-10-CM | POA: Diagnosis not present

## 2021-06-26 DIAGNOSIS — I10 Essential (primary) hypertension: Secondary | ICD-10-CM | POA: Diagnosis not present

## 2021-06-26 DIAGNOSIS — Z955 Presence of coronary angioplasty implant and graft: Secondary | ICD-10-CM | POA: Diagnosis not present

## 2021-06-26 DIAGNOSIS — Z95810 Presence of automatic (implantable) cardiac defibrillator: Secondary | ICD-10-CM | POA: Diagnosis not present

## 2021-06-26 DIAGNOSIS — Z9889 Other specified postprocedural states: Secondary | ICD-10-CM | POA: Diagnosis not present

## 2021-06-26 DIAGNOSIS — E782 Mixed hyperlipidemia: Secondary | ICD-10-CM | POA: Diagnosis not present

## 2021-06-26 DIAGNOSIS — N183 Chronic kidney disease, stage 3 unspecified: Secondary | ICD-10-CM | POA: Diagnosis not present

## 2021-06-26 DIAGNOSIS — I5020 Unspecified systolic (congestive) heart failure: Secondary | ICD-10-CM | POA: Diagnosis not present

## 2021-06-26 DIAGNOSIS — I255 Ischemic cardiomyopathy: Secondary | ICD-10-CM | POA: Diagnosis not present

## 2021-06-26 DIAGNOSIS — R0602 Shortness of breath: Secondary | ICD-10-CM | POA: Diagnosis not present

## 2021-06-26 DIAGNOSIS — Z720 Tobacco use: Secondary | ICD-10-CM | POA: Diagnosis not present

## 2021-06-26 DIAGNOSIS — I2510 Atherosclerotic heart disease of native coronary artery without angina pectoris: Secondary | ICD-10-CM | POA: Diagnosis not present

## 2021-07-01 DIAGNOSIS — Z992 Dependence on renal dialysis: Secondary | ICD-10-CM | POA: Diagnosis not present

## 2021-07-01 DIAGNOSIS — I255 Ischemic cardiomyopathy: Secondary | ICD-10-CM | POA: Diagnosis not present

## 2021-07-01 DIAGNOSIS — D631 Anemia in chronic kidney disease: Secondary | ICD-10-CM | POA: Diagnosis not present

## 2021-07-01 DIAGNOSIS — J9611 Chronic respiratory failure with hypoxia: Secondary | ICD-10-CM | POA: Diagnosis not present

## 2021-07-01 DIAGNOSIS — Z95810 Presence of automatic (implantable) cardiac defibrillator: Secondary | ICD-10-CM | POA: Diagnosis not present

## 2021-07-01 DIAGNOSIS — N186 End stage renal disease: Secondary | ICD-10-CM | POA: Diagnosis not present

## 2021-07-01 DIAGNOSIS — I2510 Atherosclerotic heart disease of native coronary artery without angina pectoris: Secondary | ICD-10-CM | POA: Diagnosis not present

## 2021-07-01 DIAGNOSIS — F32A Depression, unspecified: Secondary | ICD-10-CM | POA: Diagnosis not present

## 2021-07-01 DIAGNOSIS — J45909 Unspecified asthma, uncomplicated: Secondary | ICD-10-CM | POA: Diagnosis not present

## 2021-07-01 DIAGNOSIS — Z955 Presence of coronary angioplasty implant and graft: Secondary | ICD-10-CM | POA: Diagnosis not present

## 2021-07-01 DIAGNOSIS — Z85828 Personal history of other malignant neoplasm of skin: Secondary | ICD-10-CM | POA: Diagnosis not present

## 2021-07-01 DIAGNOSIS — Z9981 Dependence on supplemental oxygen: Secondary | ICD-10-CM | POA: Diagnosis not present

## 2021-07-01 DIAGNOSIS — I5022 Chronic systolic (congestive) heart failure: Secondary | ICD-10-CM | POA: Diagnosis not present

## 2021-07-01 DIAGNOSIS — Z7982 Long term (current) use of aspirin: Secondary | ICD-10-CM | POA: Diagnosis not present

## 2021-07-01 DIAGNOSIS — I5033 Acute on chronic diastolic (congestive) heart failure: Secondary | ICD-10-CM | POA: Diagnosis not present

## 2021-07-01 DIAGNOSIS — I252 Old myocardial infarction: Secondary | ICD-10-CM | POA: Diagnosis not present

## 2021-07-01 DIAGNOSIS — I739 Peripheral vascular disease, unspecified: Secondary | ICD-10-CM | POA: Diagnosis not present

## 2021-07-01 DIAGNOSIS — Z87891 Personal history of nicotine dependence: Secondary | ICD-10-CM | POA: Diagnosis not present

## 2021-07-01 DIAGNOSIS — E7800 Pure hypercholesterolemia, unspecified: Secondary | ICD-10-CM | POA: Diagnosis not present

## 2021-07-01 DIAGNOSIS — I132 Hypertensive heart and chronic kidney disease with heart failure and with stage 5 chronic kidney disease, or end stage renal disease: Secondary | ICD-10-CM | POA: Diagnosis not present

## 2021-07-01 DIAGNOSIS — J439 Emphysema, unspecified: Secondary | ICD-10-CM | POA: Diagnosis not present

## 2021-07-01 DIAGNOSIS — E559 Vitamin D deficiency, unspecified: Secondary | ICD-10-CM | POA: Diagnosis not present

## 2021-07-01 DIAGNOSIS — N400 Enlarged prostate without lower urinary tract symptoms: Secondary | ICD-10-CM | POA: Diagnosis not present

## 2021-07-01 DIAGNOSIS — K219 Gastro-esophageal reflux disease without esophagitis: Secondary | ICD-10-CM | POA: Diagnosis not present

## 2021-07-10 DIAGNOSIS — J449 Chronic obstructive pulmonary disease, unspecified: Secondary | ICD-10-CM | POA: Diagnosis not present

## 2021-07-10 DIAGNOSIS — N184 Chronic kidney disease, stage 4 (severe): Secondary | ICD-10-CM | POA: Diagnosis not present

## 2021-07-10 DIAGNOSIS — R42 Dizziness and giddiness: Secondary | ICD-10-CM | POA: Diagnosis not present

## 2021-07-10 DIAGNOSIS — I5022 Chronic systolic (congestive) heart failure: Secondary | ICD-10-CM | POA: Diagnosis not present

## 2021-07-10 DIAGNOSIS — I10 Essential (primary) hypertension: Secondary | ICD-10-CM | POA: Diagnosis not present

## 2021-07-10 DIAGNOSIS — R0602 Shortness of breath: Secondary | ICD-10-CM | POA: Diagnosis not present

## 2021-07-11 DIAGNOSIS — N186 End stage renal disease: Secondary | ICD-10-CM | POA: Diagnosis not present

## 2021-07-11 DIAGNOSIS — Z992 Dependence on renal dialysis: Secondary | ICD-10-CM | POA: Diagnosis not present

## 2021-07-14 DIAGNOSIS — D509 Iron deficiency anemia, unspecified: Secondary | ICD-10-CM | POA: Diagnosis not present

## 2021-07-14 DIAGNOSIS — Z992 Dependence on renal dialysis: Secondary | ICD-10-CM | POA: Diagnosis not present

## 2021-07-14 DIAGNOSIS — N186 End stage renal disease: Secondary | ICD-10-CM | POA: Diagnosis not present

## 2021-07-14 DIAGNOSIS — D631 Anemia in chronic kidney disease: Secondary | ICD-10-CM | POA: Diagnosis not present

## 2021-07-15 DIAGNOSIS — Z87891 Personal history of nicotine dependence: Secondary | ICD-10-CM | POA: Diagnosis not present

## 2021-07-15 DIAGNOSIS — Z7982 Long term (current) use of aspirin: Secondary | ICD-10-CM | POA: Diagnosis not present

## 2021-07-15 DIAGNOSIS — Z85828 Personal history of other malignant neoplasm of skin: Secondary | ICD-10-CM | POA: Diagnosis not present

## 2021-07-15 DIAGNOSIS — I132 Hypertensive heart and chronic kidney disease with heart failure and with stage 5 chronic kidney disease, or end stage renal disease: Secondary | ICD-10-CM | POA: Diagnosis not present

## 2021-07-15 DIAGNOSIS — E559 Vitamin D deficiency, unspecified: Secondary | ICD-10-CM | POA: Diagnosis not present

## 2021-07-15 DIAGNOSIS — K219 Gastro-esophageal reflux disease without esophagitis: Secondary | ICD-10-CM | POA: Diagnosis not present

## 2021-07-15 DIAGNOSIS — I5033 Acute on chronic diastolic (congestive) heart failure: Secondary | ICD-10-CM | POA: Diagnosis not present

## 2021-07-15 DIAGNOSIS — E7800 Pure hypercholesterolemia, unspecified: Secondary | ICD-10-CM | POA: Diagnosis not present

## 2021-07-15 DIAGNOSIS — Z955 Presence of coronary angioplasty implant and graft: Secondary | ICD-10-CM | POA: Diagnosis not present

## 2021-07-15 DIAGNOSIS — N400 Enlarged prostate without lower urinary tract symptoms: Secondary | ICD-10-CM | POA: Diagnosis not present

## 2021-07-15 DIAGNOSIS — I255 Ischemic cardiomyopathy: Secondary | ICD-10-CM | POA: Diagnosis not present

## 2021-07-15 DIAGNOSIS — J439 Emphysema, unspecified: Secondary | ICD-10-CM | POA: Diagnosis not present

## 2021-07-15 DIAGNOSIS — I739 Peripheral vascular disease, unspecified: Secondary | ICD-10-CM | POA: Diagnosis not present

## 2021-07-15 DIAGNOSIS — Z95810 Presence of automatic (implantable) cardiac defibrillator: Secondary | ICD-10-CM | POA: Diagnosis not present

## 2021-07-15 DIAGNOSIS — Z992 Dependence on renal dialysis: Secondary | ICD-10-CM | POA: Diagnosis not present

## 2021-07-15 DIAGNOSIS — I2510 Atherosclerotic heart disease of native coronary artery without angina pectoris: Secondary | ICD-10-CM | POA: Diagnosis not present

## 2021-07-15 DIAGNOSIS — I5022 Chronic systolic (congestive) heart failure: Secondary | ICD-10-CM | POA: Diagnosis not present

## 2021-07-15 DIAGNOSIS — J9611 Chronic respiratory failure with hypoxia: Secondary | ICD-10-CM | POA: Diagnosis not present

## 2021-07-15 DIAGNOSIS — I252 Old myocardial infarction: Secondary | ICD-10-CM | POA: Diagnosis not present

## 2021-07-15 DIAGNOSIS — D631 Anemia in chronic kidney disease: Secondary | ICD-10-CM | POA: Diagnosis not present

## 2021-07-15 DIAGNOSIS — F32A Depression, unspecified: Secondary | ICD-10-CM | POA: Diagnosis not present

## 2021-07-15 DIAGNOSIS — J45909 Unspecified asthma, uncomplicated: Secondary | ICD-10-CM | POA: Diagnosis not present

## 2021-07-15 DIAGNOSIS — N186 End stage renal disease: Secondary | ICD-10-CM | POA: Diagnosis not present

## 2021-07-15 DIAGNOSIS — Z9981 Dependence on supplemental oxygen: Secondary | ICD-10-CM | POA: Diagnosis not present

## 2021-07-17 DIAGNOSIS — Z95810 Presence of automatic (implantable) cardiac defibrillator: Secondary | ICD-10-CM | POA: Diagnosis not present

## 2021-07-17 DIAGNOSIS — N186 End stage renal disease: Secondary | ICD-10-CM | POA: Diagnosis not present

## 2021-07-17 DIAGNOSIS — N400 Enlarged prostate without lower urinary tract symptoms: Secondary | ICD-10-CM | POA: Diagnosis not present

## 2021-07-17 DIAGNOSIS — I132 Hypertensive heart and chronic kidney disease with heart failure and with stage 5 chronic kidney disease, or end stage renal disease: Secondary | ICD-10-CM | POA: Diagnosis not present

## 2021-07-17 DIAGNOSIS — J439 Emphysema, unspecified: Secondary | ICD-10-CM | POA: Diagnosis not present

## 2021-07-17 DIAGNOSIS — I739 Peripheral vascular disease, unspecified: Secondary | ICD-10-CM | POA: Diagnosis not present

## 2021-07-17 DIAGNOSIS — J45909 Unspecified asthma, uncomplicated: Secondary | ICD-10-CM | POA: Diagnosis not present

## 2021-07-17 DIAGNOSIS — Z87891 Personal history of nicotine dependence: Secondary | ICD-10-CM | POA: Diagnosis not present

## 2021-07-17 DIAGNOSIS — I252 Old myocardial infarction: Secondary | ICD-10-CM | POA: Diagnosis not present

## 2021-07-17 DIAGNOSIS — I5022 Chronic systolic (congestive) heart failure: Secondary | ICD-10-CM | POA: Diagnosis not present

## 2021-07-17 DIAGNOSIS — J9611 Chronic respiratory failure with hypoxia: Secondary | ICD-10-CM | POA: Diagnosis not present

## 2021-07-17 DIAGNOSIS — F32A Depression, unspecified: Secondary | ICD-10-CM | POA: Diagnosis not present

## 2021-07-17 DIAGNOSIS — K219 Gastro-esophageal reflux disease without esophagitis: Secondary | ICD-10-CM | POA: Diagnosis not present

## 2021-07-17 DIAGNOSIS — I5033 Acute on chronic diastolic (congestive) heart failure: Secondary | ICD-10-CM | POA: Diagnosis not present

## 2021-07-17 DIAGNOSIS — I255 Ischemic cardiomyopathy: Secondary | ICD-10-CM | POA: Diagnosis not present

## 2021-07-17 DIAGNOSIS — D631 Anemia in chronic kidney disease: Secondary | ICD-10-CM | POA: Diagnosis not present

## 2021-07-17 DIAGNOSIS — E7800 Pure hypercholesterolemia, unspecified: Secondary | ICD-10-CM | POA: Diagnosis not present

## 2021-07-17 DIAGNOSIS — Z85828 Personal history of other malignant neoplasm of skin: Secondary | ICD-10-CM | POA: Diagnosis not present

## 2021-07-17 DIAGNOSIS — E559 Vitamin D deficiency, unspecified: Secondary | ICD-10-CM | POA: Diagnosis not present

## 2021-07-17 DIAGNOSIS — Z955 Presence of coronary angioplasty implant and graft: Secondary | ICD-10-CM | POA: Diagnosis not present

## 2021-07-17 DIAGNOSIS — R0683 Snoring: Secondary | ICD-10-CM | POA: Diagnosis not present

## 2021-07-17 DIAGNOSIS — Z7982 Long term (current) use of aspirin: Secondary | ICD-10-CM | POA: Diagnosis not present

## 2021-07-17 DIAGNOSIS — Z9981 Dependence on supplemental oxygen: Secondary | ICD-10-CM | POA: Diagnosis not present

## 2021-07-17 DIAGNOSIS — Z992 Dependence on renal dialysis: Secondary | ICD-10-CM | POA: Diagnosis not present

## 2021-07-17 DIAGNOSIS — I2510 Atherosclerotic heart disease of native coronary artery without angina pectoris: Secondary | ICD-10-CM | POA: Diagnosis not present

## 2021-07-17 DIAGNOSIS — J449 Chronic obstructive pulmonary disease, unspecified: Secondary | ICD-10-CM | POA: Diagnosis not present

## 2021-07-17 DIAGNOSIS — G4730 Sleep apnea, unspecified: Secondary | ICD-10-CM | POA: Diagnosis not present

## 2021-07-24 DIAGNOSIS — Z87891 Personal history of nicotine dependence: Secondary | ICD-10-CM | POA: Diagnosis not present

## 2021-07-24 DIAGNOSIS — J439 Emphysema, unspecified: Secondary | ICD-10-CM | POA: Diagnosis not present

## 2021-07-24 DIAGNOSIS — Z7982 Long term (current) use of aspirin: Secondary | ICD-10-CM | POA: Diagnosis not present

## 2021-07-24 DIAGNOSIS — Z85828 Personal history of other malignant neoplasm of skin: Secondary | ICD-10-CM | POA: Diagnosis not present

## 2021-07-24 DIAGNOSIS — D631 Anemia in chronic kidney disease: Secondary | ICD-10-CM | POA: Diagnosis not present

## 2021-07-24 DIAGNOSIS — I739 Peripheral vascular disease, unspecified: Secondary | ICD-10-CM | POA: Diagnosis not present

## 2021-07-24 DIAGNOSIS — Z955 Presence of coronary angioplasty implant and graft: Secondary | ICD-10-CM | POA: Diagnosis not present

## 2021-07-24 DIAGNOSIS — I255 Ischemic cardiomyopathy: Secondary | ICD-10-CM | POA: Diagnosis not present

## 2021-07-24 DIAGNOSIS — Z95810 Presence of automatic (implantable) cardiac defibrillator: Secondary | ICD-10-CM | POA: Diagnosis not present

## 2021-07-24 DIAGNOSIS — E7800 Pure hypercholesterolemia, unspecified: Secondary | ICD-10-CM | POA: Diagnosis not present

## 2021-07-24 DIAGNOSIS — E559 Vitamin D deficiency, unspecified: Secondary | ICD-10-CM | POA: Diagnosis not present

## 2021-07-24 DIAGNOSIS — I132 Hypertensive heart and chronic kidney disease with heart failure and with stage 5 chronic kidney disease, or end stage renal disease: Secondary | ICD-10-CM | POA: Diagnosis not present

## 2021-07-24 DIAGNOSIS — N400 Enlarged prostate without lower urinary tract symptoms: Secondary | ICD-10-CM | POA: Diagnosis not present

## 2021-07-24 DIAGNOSIS — Z992 Dependence on renal dialysis: Secondary | ICD-10-CM | POA: Diagnosis not present

## 2021-07-24 DIAGNOSIS — K219 Gastro-esophageal reflux disease without esophagitis: Secondary | ICD-10-CM | POA: Diagnosis not present

## 2021-07-24 DIAGNOSIS — I5022 Chronic systolic (congestive) heart failure: Secondary | ICD-10-CM | POA: Diagnosis not present

## 2021-07-24 DIAGNOSIS — J45909 Unspecified asthma, uncomplicated: Secondary | ICD-10-CM | POA: Diagnosis not present

## 2021-07-24 DIAGNOSIS — Z9981 Dependence on supplemental oxygen: Secondary | ICD-10-CM | POA: Diagnosis not present

## 2021-07-24 DIAGNOSIS — F32A Depression, unspecified: Secondary | ICD-10-CM | POA: Diagnosis not present

## 2021-07-24 DIAGNOSIS — I5033 Acute on chronic diastolic (congestive) heart failure: Secondary | ICD-10-CM | POA: Diagnosis not present

## 2021-07-24 DIAGNOSIS — J9611 Chronic respiratory failure with hypoxia: Secondary | ICD-10-CM | POA: Diagnosis not present

## 2021-07-24 DIAGNOSIS — I252 Old myocardial infarction: Secondary | ICD-10-CM | POA: Diagnosis not present

## 2021-07-24 DIAGNOSIS — I2510 Atherosclerotic heart disease of native coronary artery without angina pectoris: Secondary | ICD-10-CM | POA: Diagnosis not present

## 2021-07-24 DIAGNOSIS — N186 End stage renal disease: Secondary | ICD-10-CM | POA: Diagnosis not present

## 2021-07-28 ENCOUNTER — Emergency Department: Payer: PPO

## 2021-07-28 ENCOUNTER — Other Ambulatory Visit: Payer: Self-pay

## 2021-07-28 ENCOUNTER — Inpatient Hospital Stay
Admission: EM | Admit: 2021-07-28 | Discharge: 2021-08-08 | DRG: 291 | Disposition: A | Discharge status: Hospice | Payer: PPO | Source: Skilled Nursing Facility | Attending: Internal Medicine | Admitting: Internal Medicine

## 2021-07-28 DIAGNOSIS — D631 Anemia in chronic kidney disease: Secondary | ICD-10-CM | POA: Diagnosis not present

## 2021-07-28 DIAGNOSIS — I509 Heart failure, unspecified: Secondary | ICD-10-CM | POA: Diagnosis not present

## 2021-07-28 DIAGNOSIS — I132 Hypertensive heart and chronic kidney disease with heart failure and with stage 5 chronic kidney disease, or end stage renal disease: Secondary | ICD-10-CM | POA: Diagnosis not present

## 2021-07-28 DIAGNOSIS — F32A Depression, unspecified: Secondary | ICD-10-CM | POA: Diagnosis not present

## 2021-07-28 DIAGNOSIS — G9341 Metabolic encephalopathy: Secondary | ICD-10-CM | POA: Diagnosis not present

## 2021-07-28 DIAGNOSIS — Z66 Do not resuscitate: Secondary | ICD-10-CM | POA: Diagnosis present

## 2021-07-28 DIAGNOSIS — K31811 Angiodysplasia of stomach and duodenum with bleeding: Secondary | ICD-10-CM | POA: Diagnosis present

## 2021-07-28 DIAGNOSIS — R0602 Shortness of breath: Secondary | ICD-10-CM | POA: Diagnosis not present

## 2021-07-28 DIAGNOSIS — Z91041 Radiographic dye allergy status: Secondary | ICD-10-CM

## 2021-07-28 DIAGNOSIS — Z9981 Dependence on supplemental oxygen: Secondary | ICD-10-CM

## 2021-07-28 DIAGNOSIS — I517 Cardiomegaly: Secondary | ICD-10-CM | POA: Diagnosis not present

## 2021-07-28 DIAGNOSIS — Z79899 Other long term (current) drug therapy: Secondary | ICD-10-CM

## 2021-07-28 DIAGNOSIS — I480 Paroxysmal atrial fibrillation: Secondary | ICD-10-CM | POA: Diagnosis present

## 2021-07-28 DIAGNOSIS — Z95810 Presence of automatic (implantable) cardiac defibrillator: Secondary | ICD-10-CM

## 2021-07-28 DIAGNOSIS — R001 Bradycardia, unspecified: Secondary | ICD-10-CM | POA: Diagnosis not present

## 2021-07-28 DIAGNOSIS — E7800 Pure hypercholesterolemia, unspecified: Secondary | ICD-10-CM | POA: Diagnosis present

## 2021-07-28 DIAGNOSIS — I2510 Atherosclerotic heart disease of native coronary artery without angina pectoris: Secondary | ICD-10-CM | POA: Diagnosis not present

## 2021-07-28 DIAGNOSIS — R4182 Altered mental status, unspecified: Secondary | ICD-10-CM | POA: Diagnosis not present

## 2021-07-28 DIAGNOSIS — R34 Anuria and oliguria: Secondary | ICD-10-CM | POA: Diagnosis present

## 2021-07-28 DIAGNOSIS — F419 Anxiety disorder, unspecified: Secondary | ICD-10-CM | POA: Diagnosis present

## 2021-07-28 DIAGNOSIS — J918 Pleural effusion in other conditions classified elsewhere: Secondary | ICD-10-CM | POA: Diagnosis not present

## 2021-07-28 DIAGNOSIS — R778 Other specified abnormalities of plasma proteins: Secondary | ICD-10-CM | POA: Diagnosis not present

## 2021-07-28 DIAGNOSIS — I5023 Acute on chronic systolic (congestive) heart failure: Secondary | ICD-10-CM | POA: Diagnosis present

## 2021-07-28 DIAGNOSIS — J9621 Acute and chronic respiratory failure with hypoxia: Secondary | ICD-10-CM | POA: Diagnosis present

## 2021-07-28 DIAGNOSIS — Z4682 Encounter for fitting and adjustment of non-vascular catheter: Secondary | ICD-10-CM | POA: Diagnosis not present

## 2021-07-28 DIAGNOSIS — Z7902 Long term (current) use of antithrombotics/antiplatelets: Secondary | ICD-10-CM

## 2021-07-28 DIAGNOSIS — Z955 Presence of coronary angioplasty implant and graft: Secondary | ICD-10-CM

## 2021-07-28 DIAGNOSIS — I4821 Permanent atrial fibrillation: Secondary | ICD-10-CM | POA: Diagnosis not present

## 2021-07-28 DIAGNOSIS — Z515 Encounter for palliative care: Secondary | ICD-10-CM

## 2021-07-28 DIAGNOSIS — I255 Ischemic cardiomyopathy: Secondary | ICD-10-CM | POA: Diagnosis present

## 2021-07-28 DIAGNOSIS — Z992 Dependence on renal dialysis: Secondary | ICD-10-CM

## 2021-07-28 DIAGNOSIS — I4891 Unspecified atrial fibrillation: Secondary | ICD-10-CM | POA: Diagnosis not present

## 2021-07-28 DIAGNOSIS — N2581 Secondary hyperparathyroidism of renal origin: Secondary | ICD-10-CM | POA: Diagnosis present

## 2021-07-28 DIAGNOSIS — E876 Hypokalemia: Secondary | ICD-10-CM | POA: Diagnosis not present

## 2021-07-28 DIAGNOSIS — J9690 Respiratory failure, unspecified, unspecified whether with hypoxia or hypercapnia: Secondary | ICD-10-CM | POA: Diagnosis not present

## 2021-07-28 DIAGNOSIS — J9601 Acute respiratory failure with hypoxia: Secondary | ICD-10-CM | POA: Diagnosis not present

## 2021-07-28 DIAGNOSIS — J90 Pleural effusion, not elsewhere classified: Secondary | ICD-10-CM

## 2021-07-28 DIAGNOSIS — I447 Left bundle-branch block, unspecified: Secondary | ICD-10-CM | POA: Diagnosis present

## 2021-07-28 DIAGNOSIS — Z48813 Encounter for surgical aftercare following surgery on the respiratory system: Secondary | ICD-10-CM | POA: Diagnosis not present

## 2021-07-28 DIAGNOSIS — J439 Emphysema, unspecified: Secondary | ICD-10-CM | POA: Diagnosis not present

## 2021-07-28 DIAGNOSIS — Z7189 Other specified counseling: Secondary | ICD-10-CM | POA: Diagnosis not present

## 2021-07-28 DIAGNOSIS — I10 Essential (primary) hypertension: Secondary | ICD-10-CM | POA: Diagnosis not present

## 2021-07-28 DIAGNOSIS — R0902 Hypoxemia: Secondary | ICD-10-CM | POA: Diagnosis not present

## 2021-07-28 DIAGNOSIS — Z9889 Other specified postprocedural states: Secondary | ICD-10-CM

## 2021-07-28 DIAGNOSIS — D649 Anemia, unspecified: Principal | ICD-10-CM | POA: Diagnosis present

## 2021-07-28 DIAGNOSIS — R7989 Other specified abnormal findings of blood chemistry: Secondary | ICD-10-CM | POA: Diagnosis present

## 2021-07-28 DIAGNOSIS — J45909 Unspecified asthma, uncomplicated: Secondary | ICD-10-CM | POA: Diagnosis not present

## 2021-07-28 DIAGNOSIS — H3530 Unspecified macular degeneration: Secondary | ICD-10-CM | POA: Diagnosis present

## 2021-07-28 DIAGNOSIS — D62 Acute posthemorrhagic anemia: Secondary | ICD-10-CM | POA: Diagnosis not present

## 2021-07-28 DIAGNOSIS — Z20822 Contact with and (suspected) exposure to covid-19: Secondary | ICD-10-CM | POA: Diagnosis not present

## 2021-07-28 DIAGNOSIS — I252 Old myocardial infarction: Secondary | ICD-10-CM

## 2021-07-28 DIAGNOSIS — Y95 Nosocomial condition: Secondary | ICD-10-CM | POA: Diagnosis present

## 2021-07-28 DIAGNOSIS — Z7951 Long term (current) use of inhaled steroids: Secondary | ICD-10-CM

## 2021-07-28 DIAGNOSIS — Z7982 Long term (current) use of aspirin: Secondary | ICD-10-CM

## 2021-07-28 DIAGNOSIS — Z9861 Coronary angioplasty status: Secondary | ICD-10-CM | POA: Diagnosis not present

## 2021-07-28 DIAGNOSIS — I5033 Acute on chronic diastolic (congestive) heart failure: Secondary | ICD-10-CM | POA: Diagnosis not present

## 2021-07-28 DIAGNOSIS — I120 Hypertensive chronic kidney disease with stage 5 chronic kidney disease or end stage renal disease: Secondary | ICD-10-CM | POA: Diagnosis not present

## 2021-07-28 DIAGNOSIS — J9600 Acute respiratory failure, unspecified whether with hypoxia or hypercapnia: Secondary | ICD-10-CM | POA: Diagnosis present

## 2021-07-28 DIAGNOSIS — Z7401 Bed confinement status: Secondary | ICD-10-CM | POA: Diagnosis not present

## 2021-07-28 DIAGNOSIS — J811 Chronic pulmonary edema: Secondary | ICD-10-CM | POA: Diagnosis not present

## 2021-07-28 DIAGNOSIS — Z8679 Personal history of other diseases of the circulatory system: Secondary | ICD-10-CM

## 2021-07-28 DIAGNOSIS — K31819 Angiodysplasia of stomach and duodenum without bleeding: Secondary | ICD-10-CM

## 2021-07-28 DIAGNOSIS — Z87891 Personal history of nicotine dependence: Secondary | ICD-10-CM

## 2021-07-28 DIAGNOSIS — I5022 Chronic systolic (congestive) heart failure: Secondary | ICD-10-CM | POA: Diagnosis not present

## 2021-07-28 DIAGNOSIS — I248 Other forms of acute ischemic heart disease: Secondary | ICD-10-CM | POA: Diagnosis not present

## 2021-07-28 DIAGNOSIS — N186 End stage renal disease: Secondary | ICD-10-CM | POA: Diagnosis present

## 2021-07-28 DIAGNOSIS — I428 Other cardiomyopathies: Secondary | ICD-10-CM | POA: Diagnosis not present

## 2021-07-28 DIAGNOSIS — R0603 Acute respiratory distress: Secondary | ICD-10-CM | POA: Diagnosis not present

## 2021-07-28 DIAGNOSIS — J9611 Chronic respiratory failure with hypoxia: Secondary | ICD-10-CM | POA: Diagnosis present

## 2021-07-28 DIAGNOSIS — J9620 Acute and chronic respiratory failure, unspecified whether with hypoxia or hypercapnia: Secondary | ICD-10-CM | POA: Diagnosis not present

## 2021-07-28 DIAGNOSIS — R410 Disorientation, unspecified: Secondary | ICD-10-CM | POA: Diagnosis not present

## 2021-07-28 DIAGNOSIS — K921 Melena: Secondary | ICD-10-CM | POA: Diagnosis not present

## 2021-07-28 LAB — CBC
HCT: 22.2 % — ABNORMAL LOW (ref 39.0–52.0)
Hemoglobin: 6.8 g/dL — ABNORMAL LOW (ref 13.0–17.0)
MCH: 29.3 pg (ref 26.0–34.0)
MCHC: 30.6 g/dL (ref 30.0–36.0)
MCV: 95.7 fL (ref 80.0–100.0)
Platelets: 151 10*3/uL (ref 150–400)
RBC: 2.32 MIL/uL — ABNORMAL LOW (ref 4.22–5.81)
RDW: 15.7 % — ABNORMAL HIGH (ref 11.5–15.5)
WBC: 8.6 10*3/uL (ref 4.0–10.5)
nRBC: 0 % (ref 0.0–0.2)

## 2021-07-28 LAB — PROTIME-INR
INR: 1.1 (ref 0.8–1.2)
Prothrombin Time: 14.5 seconds (ref 11.4–15.2)

## 2021-07-28 LAB — COMPREHENSIVE METABOLIC PANEL
ALT: 18 U/L (ref 0–44)
AST: 19 U/L (ref 15–41)
Albumin: 3.1 g/dL — ABNORMAL LOW (ref 3.5–5.0)
Alkaline Phosphatase: 77 U/L (ref 38–126)
Anion gap: 10 (ref 5–15)
BUN: 38 mg/dL — ABNORMAL HIGH (ref 8–23)
CO2: 25 mmol/L (ref 22–32)
Calcium: 8.5 mg/dL — ABNORMAL LOW (ref 8.9–10.3)
Chloride: 103 mmol/L (ref 98–111)
Creatinine, Ser: 4.93 mg/dL — ABNORMAL HIGH (ref 0.61–1.24)
GFR, Estimated: 11 mL/min — ABNORMAL LOW (ref >60)
Glucose, Bld: 109 mg/dL — ABNORMAL HIGH (ref 70–99)
Potassium: 4 mmol/L (ref 3.5–5.1)
Sodium: 138 mmol/L (ref 135–145)
Total Bilirubin: 0.8 mg/dL (ref 0.3–1.2)
Total Protein: 5.6 g/dL — ABNORMAL LOW (ref 6.5–8.1)

## 2021-07-28 LAB — HEPATITIS B SURFACE ANTIGEN: Hepatitis B Surface Ag: NONREACTIVE

## 2021-07-28 LAB — CBC WITH DIFFERENTIAL/PLATELET
Abs Immature Granulocytes: 0.04 10*3/uL (ref 0.00–0.07)
Basophils Absolute: 0 10*3/uL (ref 0.0–0.1)
Basophils Relative: 0 %
Eosinophils Absolute: 0 10*3/uL (ref 0.0–0.5)
Eosinophils Relative: 1 %
HCT: 21.7 % — ABNORMAL LOW (ref 39.0–52.0)
Hemoglobin: 6.7 g/dL — ABNORMAL LOW (ref 13.0–17.0)
Immature Granulocytes: 1 %
Lymphocytes Relative: 20 %
Lymphs Abs: 1.6 10*3/uL (ref 0.7–4.0)
MCH: 29.6 pg (ref 26.0–34.0)
MCHC: 30.9 g/dL (ref 30.0–36.0)
MCV: 96 fL (ref 80.0–100.0)
Monocytes Absolute: 0.9 10*3/uL (ref 0.1–1.0)
Monocytes Relative: 11 %
Neutro Abs: 5.6 10*3/uL (ref 1.7–7.7)
Neutrophils Relative %: 67 %
Platelets: 161 10*3/uL (ref 150–400)
RBC: 2.26 MIL/uL — ABNORMAL LOW (ref 4.22–5.81)
RDW: 15.7 % — ABNORMAL HIGH (ref 11.5–15.5)
WBC: 8.2 10*3/uL (ref 4.0–10.5)
nRBC: 0 % (ref 0.0–0.2)

## 2021-07-28 LAB — BLOOD GAS, VENOUS
Acid-base deficit: 0.4 mmol/L (ref 0.0–2.0)
Bicarbonate: 24.8 mmol/L (ref 20.0–28.0)
O2 Saturation: 65.4 %
Patient temperature: 37
pCO2, Ven: 42 mmHg — ABNORMAL LOW (ref 44–60)
pH, Ven: 7.38 (ref 7.25–7.43)
pO2, Ven: 38 mmHg (ref 32–45)

## 2021-07-28 LAB — RENAL FUNCTION PANEL
Albumin: 3.1 g/dL — ABNORMAL LOW (ref 3.5–5.0)
Anion gap: 10 (ref 5–15)
BUN: 44 mg/dL — ABNORMAL HIGH (ref 8–23)
CO2: 25 mmol/L (ref 22–32)
Calcium: 8.7 mg/dL — ABNORMAL LOW (ref 8.9–10.3)
Chloride: 102 mmol/L (ref 98–111)
Creatinine, Ser: 5.54 mg/dL — ABNORMAL HIGH (ref 0.61–1.24)
GFR, Estimated: 10 mL/min — ABNORMAL LOW (ref >60)
Glucose, Bld: 155 mg/dL — ABNORMAL HIGH (ref 70–99)
Phosphorus: 5.2 mg/dL — ABNORMAL HIGH (ref 2.5–4.6)
Potassium: 4.6 mmol/L (ref 3.5–5.1)
Sodium: 137 mmol/L (ref 135–145)

## 2021-07-28 LAB — TROPONIN I (HIGH SENSITIVITY)
Troponin I (High Sensitivity): 269 ng/L (ref <18)
Troponin I (High Sensitivity): 292 ng/L (ref <18)
Troponin I (High Sensitivity): 295 ng/L (ref <18)

## 2021-07-28 LAB — IRON AND TIBC
Iron: 57 ug/dL (ref 45–182)
Saturation Ratios: 28 % (ref 17.9–39.5)
TIBC: 202 ug/dL — ABNORMAL LOW (ref 250–450)
UIBC: 145 ug/dL

## 2021-07-28 LAB — PREPARE RBC (CROSSMATCH)

## 2021-07-28 LAB — BRAIN NATRIURETIC PEPTIDE: B Natriuretic Peptide: 3382.3 pg/mL — ABNORMAL HIGH (ref 0.0–100.0)

## 2021-07-28 LAB — RESP PANEL BY RT-PCR (FLU A&B, COVID) ARPGX2
Influenza A by PCR: NEGATIVE
Influenza B by PCR: NEGATIVE
SARS Coronavirus 2 by RT PCR: NEGATIVE

## 2021-07-28 LAB — PROCALCITONIN: Procalcitonin: <0.1 ng/mL

## 2021-07-28 LAB — GLUCOSE, CAPILLARY: Glucose-Capillary: 174 mg/dL — ABNORMAL HIGH (ref 70–99)

## 2021-07-28 LAB — HEPATITIS B SURFACE ANTIBODY,QUALITATIVE: Hep B S Ab: NONREACTIVE

## 2021-07-28 LAB — HEMOGLOBIN AND HEMATOCRIT, BLOOD
HCT: 23.5 % — ABNORMAL LOW (ref 39.0–52.0)
Hemoglobin: 7.4 g/dL — ABNORMAL LOW (ref 13.0–17.0)

## 2021-07-28 MED ORDER — ALTEPLASE 2 MG IJ SOLR
2.0000 mg | Freq: Once | INTRAMUSCULAR | Status: DC | PRN
Start: 2021-07-28 — End: 2021-07-30

## 2021-07-28 MED ORDER — PENTAFLUOROPROP-TETRAFLUOROETH EX AERO
1.0000 "application " | INHALATION_SPRAY | CUTANEOUS | Status: DC | PRN
  Filled 2021-07-28: qty 30

## 2021-07-28 MED ORDER — SODIUM CHLORIDE 0.9% FLUSH
3.0000 mL | INTRAVENOUS | Status: DC | PRN
  Administered 2021-08-07: 3 mL via INTRAVENOUS

## 2021-07-28 MED ORDER — PANTOPRAZOLE SODIUM 40 MG IV SOLR
40.0000 mg | INTRAVENOUS | Status: DC
  Administered 2021-07-28 – 2021-08-05 (×8): 40 mg via INTRAVENOUS
  Filled 2021-07-28 (×8): qty 10

## 2021-07-28 MED ORDER — SODIUM CHLORIDE 0.9 % IV SOLN: 100.0000 mL | INTRAVENOUS | Status: DC | PRN

## 2021-07-28 MED ORDER — TAMSULOSIN HCL 0.4 MG PO CAPS
0.4000 mg | ORAL_CAPSULE | Freq: Every day | ORAL | Status: DC
Start: 2021-07-29 — End: 2021-08-01
  Administered 2021-07-29 – 2021-07-31 (×3): 0.4 mg via ORAL
  Filled 2021-07-28 (×3): qty 1

## 2021-07-28 MED ORDER — HYDROMORPHONE HCL 1 MG/ML IJ SOLN: 0.5000 mg | Freq: Once | INTRAMUSCULAR | Status: DC

## 2021-07-28 MED ORDER — FINASTERIDE 5 MG PO TABS
5.0000 mg | ORAL_TABLET | Freq: Every day | ORAL | Status: DC
  Administered 2021-07-29 – 2021-08-02 (×4): 5 mg via ORAL
  Filled 2021-07-28 (×5): qty 1

## 2021-07-28 MED ORDER — SODIUM CHLORIDE 0.9 % IV SOLN: 10.0000 mL/h | Freq: Once | INTRAVENOUS | Status: DC

## 2021-07-28 MED ORDER — HYDROMORPHONE HCL 1 MG/ML IJ SOLN
INTRAMUSCULAR | Status: AC
  Administered 2021-07-28: 1 mg
  Filled 2021-07-28: qty 1

## 2021-07-28 MED ORDER — SODIUM CHLORIDE 0.9 % IV SOLN: 250.0000 mL | INTRAVENOUS | Status: DC | PRN

## 2021-07-28 MED ORDER — ATORVASTATIN CALCIUM 20 MG PO TABS
40.0000 mg | ORAL_TABLET | Freq: Every day | ORAL | Status: DC
  Administered 2021-07-29 – 2021-07-31 (×3): 40 mg via ORAL
  Filled 2021-07-28 (×3): qty 2

## 2021-07-28 MED ORDER — FUROSEMIDE 10 MG/ML IJ SOLN
80.0000 mg | Freq: Once | INTRAMUSCULAR | Status: AC
  Administered 2021-07-28: 80 mg via INTRAVENOUS
  Filled 2021-07-28: qty 8

## 2021-07-28 MED ORDER — ONDANSETRON HCL 4 MG PO TABS: 4.0000 mg | ORAL_TABLET | Freq: Four times a day (QID) | ORAL | Status: DC | PRN

## 2021-07-28 MED ORDER — VITAMIN B-12 1000 MCG PO TABS
1000.0000 ug | ORAL_TABLET | Freq: Every day | ORAL | Status: DC
  Administered 2021-07-29 – 2021-07-31 (×3): 1000 ug via ORAL
  Filled 2021-07-28 (×3): qty 1

## 2021-07-28 MED ORDER — IPRATROPIUM-ALBUTEROL 0.5-2.5 (3) MG/3ML IN SOLN
RESPIRATORY_TRACT | Status: AC
Start: 2021-07-28 — End: 2021-07-28
  Filled 2021-07-28: qty 3

## 2021-07-28 MED ORDER — UMECLIDINIUM BROMIDE 62.5 MCG/ACT IN AEPB
1.0000 | INHALATION_SPRAY | Freq: Every day | RESPIRATORY_TRACT | Status: DC
  Administered 2021-07-29 – 2021-07-31 (×3): 1 via RESPIRATORY_TRACT
  Filled 2021-07-28: qty 7

## 2021-07-28 MED ORDER — SODIUM CHLORIDE 0.9% FLUSH
3.0000 mL | Freq: Two times a day (BID) | INTRAVENOUS | Status: DC
  Administered 2021-07-28 – 2021-08-08 (×18): 3 mL via INTRAVENOUS

## 2021-07-28 MED ORDER — CARVEDILOL 6.25 MG PO TABS
6.2500 mg | ORAL_TABLET | Freq: Two times a day (BID) | ORAL | Status: DC
  Administered 2021-07-29 – 2021-07-31 (×6): 6.25 mg via ORAL
  Filled 2021-07-28 (×2): qty 1
  Filled 2021-07-28: qty 2
  Filled 2021-07-28 (×3): qty 1

## 2021-07-28 MED ORDER — CALCITRIOL 0.25 MCG PO CAPS
0.2500 ug | ORAL_CAPSULE | Freq: Every day | ORAL | Status: DC
  Administered 2021-07-29 – 2021-07-31 (×3): 0.25 ug via ORAL
  Filled 2021-07-28 (×3): qty 1

## 2021-07-28 MED ORDER — MOMETASONE FURO-FORMOTEROL FUM 100-5 MCG/ACT IN AERO
2.0000 | INHALATION_SPRAY | Freq: Two times a day (BID) | RESPIRATORY_TRACT | Status: DC
  Administered 2021-07-28 – 2021-07-31 (×7): 2 via RESPIRATORY_TRACT
  Filled 2021-07-28: qty 8.8

## 2021-07-28 MED ORDER — ONDANSETRON HCL 4 MG/2ML IJ SOLN: 4.0000 mg | Freq: Four times a day (QID) | INTRAMUSCULAR | Status: DC | PRN

## 2021-07-28 MED ORDER — LIDOCAINE HCL (PF) 1 % IJ SOLN
5.0000 mL | INTRAMUSCULAR | Status: DC | PRN
  Filled 2021-07-28: qty 5

## 2021-07-28 MED ORDER — LIDOCAINE-PRILOCAINE 2.5-2.5 % EX CREA: 1.0000 "application " | TOPICAL_CREAM | CUTANEOUS | Status: DC | PRN

## 2021-07-28 MED ORDER — IPRATROPIUM-ALBUTEROL 0.5-2.5 (3) MG/3ML IN SOLN
3.0000 mL | Freq: Four times a day (QID) | RESPIRATORY_TRACT | Status: DC
  Administered 2021-07-28 – 2021-07-29 (×3): 3 mL via RESPIRATORY_TRACT
  Filled 2021-07-28 (×3): qty 3

## 2021-07-28 MED ORDER — CHLORHEXIDINE GLUCONATE CLOTH 2 % EX PADS
6.0000 | MEDICATED_PAD | Freq: Every day | CUTANEOUS | Status: DC
  Administered 2021-07-28 – 2021-08-08 (×8): 6 via TOPICAL
  Filled 2021-07-28: qty 6

## 2021-07-28 MED ORDER — ACETAMINOPHEN 325 MG PO TABS: 650.0000 mg | ORAL_TABLET | Freq: Four times a day (QID) | ORAL | Status: DC | PRN

## 2021-07-28 MED ORDER — ORAL CARE MOUTH RINSE
15.0000 mL | Freq: Two times a day (BID) | OROMUCOSAL | Status: DC
  Administered 2021-07-28 – 2021-08-01 (×7): 15 mL via OROMUCOSAL

## 2021-07-28 MED ORDER — LORATADINE 10 MG PO TABS
10.0000 mg | ORAL_TABLET | Freq: Every day | ORAL | Status: DC
  Administered 2021-07-29 – 2021-07-31 (×3): 10 mg via ORAL
  Filled 2021-07-28 (×3): qty 1

## 2021-07-28 MED ORDER — ACETAMINOPHEN 650 MG RE SUPP: 650.0000 mg | Freq: Four times a day (QID) | RECTAL | Status: DC | PRN

## 2021-07-28 MED ORDER — HEPARIN SODIUM (PORCINE) 1000 UNIT/ML IJ SOLN
INTRAMUSCULAR | Status: AC
  Administered 2021-07-28: 3800 [IU] via INTRAVENOUS_CENTRAL
  Filled 2021-07-28: qty 10

## 2021-07-28 MED ORDER — ISOSORBIDE MONONITRATE ER 30 MG PO TB24
30.0000 mg | ORAL_TABLET | Freq: Every day | ORAL | Status: DC
  Administered 2021-07-28 – 2021-07-31 (×4): 30 mg via ORAL
  Filled 2021-07-28 (×4): qty 1

## 2021-07-28 MED ORDER — HEPARIN SODIUM (PORCINE) 1000 UNIT/ML DIALYSIS
1000.0000 [IU] | INTRAMUSCULAR | Status: DC | PRN
  Filled 2021-07-28: qty 1

## 2021-07-28 MED ORDER — CITALOPRAM HYDROBROMIDE 20 MG PO TABS
20.0000 mg | ORAL_TABLET | Freq: Every day | ORAL | Status: DC
  Administered 2021-07-29 – 2021-07-31 (×3): 20 mg via ORAL
  Filled 2021-07-28 (×3): qty 1

## 2021-07-28 MED ORDER — ALBUTEROL SULFATE (2.5 MG/3ML) 0.083% IN NEBU
2.5000 mg | INHALATION_SOLUTION | RESPIRATORY_TRACT | Status: DC | PRN
  Administered 2021-08-04 – 2021-08-05 (×2): 2.5 mg via RESPIRATORY_TRACT
  Filled 2021-07-28: qty 3

## 2021-07-28 NOTE — Plan of Care (Addendum)
Patient from ED 4 to 30 (cpod). Upon arrival , - patient was anxious and indicating he was having trouble breathing.  Vitals stable and SPO2 levels remained stable on 4LNC which was his home O2 continuous dose.    Within 5 minutes of arrival Renal came to round and voiced concerned that patient may need Bipap. Attempted to contact Resp but no answer and later realized ED resp was already in another code.  3333 called to activate rapid since patient appeared to be declining quickly.  RR had increased to shallow rapid 30+ breathes/min.  PLaced on non-rebreather. Hospital rapid team and Resp arrived to assist and placed Bipap on patient.  Hosp also informed and came to bedside.  Patient continued to complain of difficulty breathing and seemed unable to follow suggestions to slow his RR rate.  Neb, dilaudid and '80mg'$  lV lasix given to try to assist with breathing and transferred to room 5 (ICU).  ?

## 2021-07-28 NOTE — Assessment & Plan Note (Addendum)
Due to demand ischemia from anemia ?Unable to administer any antiplatelets for now due to symptomatic anemia ?

## 2021-07-28 NOTE — Progress Notes (Signed)
1105: Patient arrived in ICU on bipap with charge RN, primary RN and RT. Patient in no obvious distress and when asked patient nods his head that he feels much better. CHG bath given, changed into hospital gown and transferred to ICU bed. Patient vital signs stable.  ? ?1120: Patient states he does make urine and he feels as though he has some in his bladder, but can not urinate. Bladder scan showed 225m of urine, placed external male catheter and educated patient on the male catheter. Per NP (who is now bedside), patient to have HD and get 1 unit RBC during HD.  ? ?16712 Call placed to HD RN re eta. Per HD RN, should be able to do HD within 1-2 hrs.  ? ?14580 Patient daughter bedside, informed of potential HD time. Discussed HD and blood transfusion with patient and daughter, as well as need for consent. Both verbalize understanding, patient wishes daughter to sign consent at this time. Patient endorses feeling much better than earlier, though explains he does not want anyone to "take away his oxygen." Explained to patient and daughter he will remain in room for HD and keep bipap on right now, though bipap is typically temporary until patient condition improves. Daughter states the patient often gets anxious in relation to his medical status. ? ?1600: 1 unit RBCs obtained from blood bank and verified with HD RN. Patient currently receiving HD and blood per HD RN.  ? ?19983 Patient daughter asking about patient ability to eat after HD. Secure chat sent to MD, awaiting answer/orders.  ? ?13825 NT bladder scan patient, scanning 030m Patient states he does not feel like he is retaining any urine at this time.  ? ?1815: Spoke with RT, after shift change patient to transfer from bipap to HFNC after HD and shift change. Patient and family agreeable.  ?

## 2021-07-28 NOTE — Assessment & Plan Note (Addendum)
Patient presents to the ER for evaluation of worsening shortness of breath from his baseline and noted to have a hemoglobin of 6.7 ?His baseline hemoglobin is around 8 ?Patient states that he has had melena stools and chart review shows he had a recent upper endoscopy done in February which revealed gastritis. ?His Plavix has been on hold ?Continue IV Protonix.  GI consult ?Hemoglobin 6.9 this morning we will transfuse 1 more PRBC today ?

## 2021-07-28 NOTE — Assessment & Plan Note (Addendum)
Continue scheduled and as needed bronchodilator therapy ?Continue inhaled steroids ?On 2 L oxygen via nasal cannula now.  Wean as able ?

## 2021-07-28 NOTE — Consult Note (Signed)
?[facility] CONSULT NOTE  ? ?    ?Patient ID: ?Peter [name] ?MRN: [id number] ?DOB/AGE: 1941-02-26 81 y.o. ? ?Admit date: 07/28/2021 ?Referring Physician Dr. Francine Graven ?Primary Physician Mortimer Fries, PA-C ?Primary Cardiologist Dr. Clayborn Bigness ?Reason for Consultation atrial fibrillation ? 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 Cardiology is consulted due to AF seen on initial EKG.  ? ?He presented to [facility] ED today with shortness of breath above his baseline and worsening generalized fatigue x 2-3 days. He was hypoxic to the 80s on 4L per EMS and was given solumedrol and a duoneb en route to the ED.  A rapid response was called in the ED for respiratory distress and agitation, was started initially on NRB at 15L, then eventually BIPAP and was given hydromorphone x1 and lasix '80mg'$  x1. I examined the patient in the ICU while on BIPAP, daughter at bedside. Says his breathing is worse with laying flat or with walking short distances. He has been compliant with dialysis and his medications. Denies chest pain, palpitations, worsening LEE. Says he did not make much urine after getting lasix this morning.  ? ? Labs are notable for a Hgb of 6.7 (was 8.1 at discharge 06/2021), procal negative. Iron low normal, TIBC low. Cr 4.93 and GFR 11 (was 2.69 and 23 at discharge. BNP 3,382. Troponin elevated but flat trending [phone].  CXR with increased opacity at the right lung base favoring atelectasis. BP 140/73 during interview, highest HR documented was  113 while being taken off NRB to BIPAP.  ? ?Review of systems complete and found to be negative unless listed above  ? ? ? ?Past Medical History:  ?Diagnosis Date  ? Anginal pain ([place])   ? Asthma   ? Bladder infection, acute 03/2011  ? "had a whole lot of bleeding from this"  ? CHF (congestive heart failure) ([place])   ? Coronary artery disease   ? High cholesterol   ? Hypertension   ? 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 "had it in my right; getting shots now in my left"  ? Myocardial infarction [facility]) 1996  ? NSTEMI (non-ST elevated myocardial infarction) ([place]) 12/14/2011  ? Shortness of breath 12/14/2011  ? "@ rest, lying down, w/exertion"  ?  ?Past Surgical History:  ?Procedure Laterality Date  ? [place]; ~ 2003; ~ 2008  ? "total of 3"  ? CORONARY STENT INTERVENTION N/A 08/10/2016  ? Procedure: Coronary Stent Intervention;  Surgeon: [name], MD;  Location: [place] CV LAB;  Service: Cardiovascular;  Laterality: N/A;  ? DIALYSIS/PERMA CATHETER INSERTION N/A 06/17/2021  ? Procedure: DIALYSIS/PERMA CATHETER INSERTION;  Surgeon: Katha Cabal, MD;  Location: [place] CV LAB;  Service: Cardiovascular;  Laterality: N/A;  ? HERNIA REPAIR  ~ 2001  ? "abdominal w/mesh implanted"  ? LEFT HEART CATH AND CORONARY ANGIOGRAPHY N/A 08/10/2016  ? Procedure: Left Heart Cath and Coronary Angiography;  Surgeon: [name], MD;  Location: [place] CV LAB;  Service: Cardiovascular;  Laterality: N/A;  ? LEFT HEART CATHETERIZATION WITH CORONARY ANGIOGRAM N/A 12/16/2011  ?  Procedure: LEFT HEART CATHETERIZATION WITH CORONARY ANGIOGRAM;  Surgeon: Minus Breeding, MD;  Location: [facility] CATH LAB;  Service: Cardiovascular;  Laterality: N/A;  ? PERCUTANEOUS CORONARY STENT INTERVENTION (PCI-S) N/A 12/17/2011  ? Procedure: PERCUTANEOUS CORONARY STENT INTERVENTION (PCI-S);  Surgeon: Sherren Mocha, MD;  Location: [facility] CATH LAB;  Service: Cardiovascular;  Laterality: N/A;  ? TONSILLECTOMY     ? "I was a kid"  ?  ?Medications Prior to Admission  ?Medication Sig Dispense Refill Last Dose  ? amLODipine (NORVASC) 10 MG tablet Take 1 tablet (10 mg total) by mouth daily. 30 tablet 2   ? atorvastatin (LIPITOR) 40 MG tablet Take 1 tablet (40 mg total) by mouth daily at 6 PM. 30 tablet 0   ? calcitRIOL (ROCALTROL) 0.25 MCG capsule Take 0.25 mcg by mouth daily.     ? carvedilol (COREG) 6.25 MG tablet Take 1 tablet (6.25 mg total) by mouth 2 (two) times daily with a meal. 60 tablet 2   ? cetirizine (ZYRTEC) 10 MG tablet Take 10 mg by mouth.     ? citalopram (CELEXA) 20 MG tablet Take 20 mg by mouth daily.     ? clobetasol cream (TEMOVATE) 2.99 % Apply 1 application topically 2 (two) times daily as needed (psoriasis).  1   ? clopidogrel (PLAVIX) 75 MG tablet Hold until outpatient followup with cardiology due to worsening anemia. (Patient not taking: Reported on 06/05/2021) 30 tablet 1   ? cyanocobalamin 1000 MCG tablet Take 1,000 mcg by mouth daily.      ? finasteride (PROSCAR) 5 MG tablet Take 1 tablet (5 mg total) by mouth daily. 30 tablet 2   ? GLUCOSAMINE-CHONDROITIN-VIT C PO Take 1 tablet by mouth daily.     ? hydrALAZINE (APRESOLINE) 100 MG tablet Take 1 tablet (100 mg total) by mouth 3 (three) times daily. 90 tablet 2   ? isosorbide mononitrate (IMDUR) 30 MG 24 hr tablet Take 1 tablet (30 mg total) by mouth daily. 30 tablet 2   ? omeprazole (PRILOSEC) 20 MG capsule Take 20 mg by mouth in the morning.     ? spironolactone (ALDACTONE) 25 MG tablet Hold until followup with your outpatient doctor due to worsening kidney function. (Patient not taking: Reported on 06/05/2021)     ? tamsulosin (FLOMAX) 0.4 MG CAPS capsule Take 1 capsule (0.4 mg total) by mouth daily. 30 capsule 2   ? umeclidinium bromide (INCRUSE ELLIPTA) 62.5 MCG/ACT AEPB Inhale 1 puff into the lungs daily. 30 each 2   ? ?Social History  ? ?Socioeconomic History  ? Marital status: Widowed  ?  Spouse name: Not on file  ? Number of children: Not on  file  ? Years of education: Not on file  ? Highest education level: Not on file  ?Occupational History  ? Not on file  ?Tobacco Use  ? Smoking status: Former  ?  Packs/day: 0.50  ?  Years: 0.50  ?  Pack years: 0.25  ?  Types: Cigarettes  ? Smokeless tobacco: Never  ? Tobacco comments:  ?  11/23/16 Still not ready to quit smoking.  ?Substance and Sexual Activity  ? Alcohol use: Yes  ?  Alcohol/week: 1.0 standard drink  ?  Types: 1 Cans of beer per week  ?  Comment: 12/14/2011 "if my kidneys are sore, I drink 1 beer/day; if not sore; no beer; occasionally drink socially; ave 1 beer/wk maybe"  ? Drug use: No  ? Sexual activity: Not Currently  ?Other Topics Concern  ?  Not on file  ?Social History Narrative  ? Not on file  ? ?Social Determinants of Health  ? ?Financial Resource Strain: Not on file  ?Food Insecurity: Not on file  ?Transportation Needs: Not on file  ?Physical Activity: Not on file  ?Stress: Not on file  ?Social Connections: Not on file  ?Intimate Partner Violence: Not on file  ?  ?Family History  ?Family history unknown: Yes  ?  ? ? ?Review of systems complete and found to be negative unless listed above  ? ? ?PHYSICAL EXAM ?General: Elderly and chronically ill appearing caucasian male, in no acute distress. Laying at incline in ICU with daughter at bedside.  ?HEENT:  Normocephalic and atraumatic. ?Neck:  No JVD.  ?Lungs: on BiPAP. Able to speak clearly. Clear bilaterally to auscultation. No wheezes, crackles, rhonchi.  ?Heart: regular rate with ectopy. Normal S1 and S2 without gallops or murmurs. Radial & DP pulses 2+ bilaterally. ?Abdomen: soft, nontender, obese. ?Msk: Normal strength and tone for age. ?Extremities: Warm and well perfused. No clubbing, cyanosis. No peripheral edema.  ?Neuro: Alert and oriented X 3. ?Psych:  Answers questions appropriately.  ? ?Labs: ?  ?Lab Results  ?Component Value Date  ? WBC 8.2 07/28/2021  ? HGB 6.7 (L) 07/28/2021  ? HCT 21.7 (L) 07/28/2021  ? MCV 96.0 07/28/2021  ? PLT  161 07/28/2021  ?  ?Recent Labs  ?Lab 07/28/21 ?1478  ?NA 138  ?K 4.0  ?CL 103  ?CO2 25  ?BUN 38*  ?CREATININE 4.93*  ?CALCIUM 8.5*  ?PROT 5.6*  ?BILITOT 0.8  ?ALKPHOS 77  ?ALT 18  ?AST 19  ?GLUCOSE 10

## 2021-07-28 NOTE — TOC Initial Note (Signed)
Transition of Care (TOC) - Initial/Assessment Note  ? ? ?Patient Details  ?Name: Peter [name] ?MRN: [id number] ?Date of Birth: 06/29/1940 ? ?Transition of Care (TOC) CM/SW Contact:    ?Shelbie Hutching, RN ?Phone Number: ?07/28/2021, 10:44 AM ? ?Clinical Narrative:                 ?Patient admitted to the hospital acute respiratory failure and symptomatic anemia.  Patient is from [facility].  He is on Chronic oxygen at 4L and goes to dialysis MWF at [facility].   ? ?TOC will follow.  ? ?Expected Discharge Plan: Assisted Living ?Barriers to Discharge: Continued Medical Work up ? ? ?Patient Goals and CMS Choice ?  ?  ?  ? ?Expected Discharge Plan and Services ?Expected Discharge Plan: Assisted Living ?  ?Discharge Planning Services: CM Consult ?  ?Living arrangements for the past 2 months: [place] ?                ?  ?  ?  ?  ?  ?  ?  ?  ?  ?  ? ?Prior Living Arrangements/Services ?Living arrangements for the past 2 months: [place] ?Lives with:: Facility Resident ?Patient language and need for interpreter reviewed:: Yes ?       ?Need for Family Participation in Patient Care: Yes (Comment) ?Care giver support system in place?: Yes (comment) (daughter) ?Current home services: DME (oxygen) ?Criminal Activity/Legal Involvement Pertinent to Current Situation/Hospitalization: No - Comment as needed ? ?Activities of Daily Living ?  ?  ? ?Permission Sought/Granted ?  ?  ?   ?   ?   ?   ? ?Emotional Assessment ?  ?  ?  ?  ?Alcohol / Substance Use: Not Applicable ?Psych Involvement: No (comment) ? ?Admission diagnosis:  Symptomatic anemia [D64.9] ?Acute respiratory failure ([place]) [J96.00] ?Patient Active Problem List  ? Diagnosis Date Noted  ? Symptomatic anemia 07/28/2021  ? Acute respiratory failure ([place]) 07/28/2021  ? Chronic respiratory failure with hypoxia ([place]) 06/11/2021  ? HLD (hyperlipidemia) 06/10/2021  ? CKD (chronic kidney disease), stage IV ([place]) 06/10/2021  ? Elevated  troponin   ? Chest pain 05/28/2021  ? Acute exacerbation of CHF (congestive heart failure) ([place]) 04/27/2021  ? CKD (chronic kidney disease) stage 4, GFR 15-29 ml/min (HCC) 04/04/2021  ? 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 Acute anemia 04/04/2021  ? Thrombocytopenia ([place]) 04/04/2021  ? ABLA (acute blood loss anemia) 04/04/2021  ? AKI (acute kidney injury) ([place]) 04/04/2021  ? Normocytic anemia   ? 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 CKD (chronic kidney disease) stage 3, GFR 30-59 ml/min (HCC) 05/18/2018  ? Elevated hemoglobin A1c 05/18/2018  ? History of skin cancer 05/18/2018  ? Ischemic cardiomyopathy 12/16/2017  ? S/P ICD (internal cardiac defibrillator) procedure 12/16/2017  ? Acute on chronic systolic CHF (congestive heart failure) ([place]) 09/07/2016  ? Congestive heart failure ([place]) 01/17/2015  ? Current tobacco use 01/17/2015  ? Hypercholesterolemia 01/17/2015  ? Peripheral vascular disease ([place]) 01/17/2015  ? 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 Arthritis, degenerative 07/22/2013  ? H/O angina pectoris 07/22/2013  ? Adiposity 07/22/2013  ? 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 NSTEMI (non-ST elevated myocardial infarction) ([place]) 12/17/2011  ? Hypokalemia 12/15/2011  ? SOB (shortness of breath) 12/14/2011  ? CAD (coronary artery disease) 12/14/2011  ? HTN (hypertension)  12/14/2011  ? Hyperlipidemia 12/14/2011  ? GERD (gastroesophageal reflux disease) 12/14/2011  ? BPH (benign prostatic hyperplasia) 12/14/2011  ? 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 Aneurysm of thoracic aorta ([place]) 12/14/2011  ? ?PCP:  Tracie Harrier, MD ?Pharmacy:   ?[facility] [place], [address] ?[place] [address] ?Phone: [phone] Fax:  [phone] ? ? ? ? ?Social Determinants of Health (SDOH) Interventions ?  ? ?Readmission Risk Interventions ? ?  06/11/2021  ? 12:25 PM 04/28/2021  ? 12:10 PM  ?Readmission Risk Prevention Plan  ?Transportation Screening Complete Complete  ?PCP or Specialist Appt within 3-5 Days  Complete  ?[place] or Home Care Consult  Complete  ?Social Work Consult for [place] Planning/Counseling  Complete  ?Palliative Care Screening  Not Applicable  ?Medication Review Press photographer) Complete Complete  ?PCP or Specialist appointment within 3-5 days of discharge Complete   ?[place] or Home Care Consult Complete   ?SW Recovery Care/Counseling Consult Complete   ?Palliative Care Screening Not Applicable   ?[place] Not Applicable   ? ? ? ?

## 2021-07-28 NOTE — ED Provider Notes (Signed)
? ?[facility] ?Provider Note ? ? ? Event Date/Time  ? First MD Initiated Contact with Patient 07/28/21 [phone]   ?  (approximate) ? ? ?History  ? ?Shortness of Breath ? ? ?HPI ? ?Peter [name] is a 81 y.o. male with past medical history of ESRD, CHF, chronic hypoxic respiratory failure, here with shortness of breath.  Patient states that over the last 2 to 3 days, he has had progressively worsening generalized fatigue.  He is on 3 L nasal cannula at baseline, and has been on this but has had intermittent episodes where he is felt very winded, short of breath, lightheaded.  The symptoms are worse with any kind of exertion or lying flat.  He has checked his oxygen and has been in the 70s despite being on his home oxygen.  Patient was just referred to pulmonology on 07/2021, and has also recently been transfused for acute on chronic anemia.  He is due for dialysis today.  He did sit for a full session on Friday.  Denies any significant cough or increased sputum production.  He has had generalized fatigue.  He has had general deconditioning over the last several months due to recurrent illnesses and hospitalizations.  Denies any other complaints. ?  ? ? ?Physical Exam  ? ?Triage Vital Signs: ?ED Triage Vitals  ?Enc Vitals Group  ?   BP 07/28/21 0819 (!) 147/90  ?   Pulse Rate 07/28/21 0819 80  ?   Resp 07/28/21 0819 20  ?   Temp 07/28/21 0819 97.6 ?F (36.4 ?C)  ?   Temp Source 07/28/21 0819 Oral  ?   SpO2 07/28/21 0819 96 %  ?   Weight 07/28/21 0818 198 lb 6.6 oz (90 kg)  ?   Height 07/28/21 0818 '5\' 11"'$  (1.803 m)  ?   Head Circumference --   ?   Peak Flow --   ?   Pain Score 07/28/21 0818 0  ?   Pain Loc --   ?   Pain Edu? --   ?   Excl. in [place]? --   ? ? ?Most recent vital signs: ?Vitals:  ? 07/28/21 0900 07/28/21 0930  ?BP: (!) 144/70 132/60  ?Pulse: 68 70  ?Resp: 17 14  ?Temp:    ?SpO2: 97% 96%  ? ? ? ?General: Awake, no distress.  ?CV:  Good peripheral perfusion.  ?Resp:  Mild tachypnea with  increased work of breathing, bibasilar rales appreciated.  No significant wheezing. ?Abd:  No distention.  Mild abdominal wall edema. ?Other:  1+ edema bilateral lower extremities. ? ? ?ED Results / Procedures / Treatments  ? ?Labs ?(all labs ordered are listed, but only abnormal results are displayed) ?Labs Reviewed  ?CBC WITH DIFFERENTIAL/PLATELET - Abnormal; Notable for the following components:  ?    Result Value  ? RBC 2.26 (*)   ? Hemoglobin 6.7 (*)   ? HCT 21.7 (*)   ? RDW 15.7 (*)   ? All other components within normal limits  ?COMPREHENSIVE METABOLIC PANEL - Abnormal; Notable for the following components:  ? Glucose, Bld 109 (*)   ? BUN 38 (*)   ? Creatinine, Ser 4.93 (*)   ? Calcium 8.5 (*)   ? Total Protein 5.6 (*)   ? Albumin 3.1 (*)   ? GFR, Estimated 11 (*)   ? All other components within normal limits  ?BRAIN NATRIURETIC PEPTIDE - Abnormal; Notable for the following components:  ? B Natriuretic  Peptide 3,382.3 (*)   ? All other components within normal limits  ?BLOOD GAS, VENOUS - Abnormal; Notable for the following components:  ? pCO2, Ven 42 (*)   ? All other components within normal limits  ?TROPONIN I (HIGH SENSITIVITY) - Abnormal; Notable for the following components:  ? Troponin I (High Sensitivity) 269 (*)   ? All other components within normal limits  ?RESP PANEL BY RT-PCR (FLU A&B, COVID) ARPGX2  ?PROCALCITONIN  ?PROTIME-INR  ?PREPARE RBC (CROSSMATCH)  ?TYPE AND SCREEN  ?TROPONIN I (HIGH SENSITIVITY)  ? ? ? ?EKG ?Atrial fibrillation, ventricular rate 75.  QRS 156, QTc 572.  Nonspecific intraventricular conduction delay, old inferior infarct.  No acute ST elevations. ? ? ?RADIOLOGY ?Chest x-ray: Increased opacity in the right lung base, suspect atelectasis ? ? ?I also independently reviewed and agree wit radiologist interpretations. ? ? ?PROCEDURES: ? ?Critical Care performed: Yes, see critical care procedure note(s) ? ?.Critical Care ?Performed by: Duffy Bruce, MD ?Authorized by: Duffy Bruce, MD  ? ?Critical care provider statement:  ?  Critical care time (minutes):  30 ?  Critical care time was exclusive of:  Separately billable procedures and treating other patients ?  Critical care was necessary to treat or prevent imminent or life-threatening deterioration of the following conditions:  Cardiac failure, circulatory failure and respiratory failure ?  Critical care was time spent personally by me on the following activities:  Development of treatment plan with patient or surrogate, discussions with consultants, evaluation of patient's response to treatment, examination of patient, ordering and review of laboratory studies, ordering and review of radiographic studies, ordering and performing treatments and interventions, pulse oximetry, re-evaluation of patient's condition and review of old charts ? ? ? ?MEDICATIONS ORDERED IN ED: ?Medications  ?0.9 %  sodium chloride infusion (0 mL/hr Intravenous Hold 07/28/21 0915)  ? ? ? ?IMPRESSION / MDM / ASSESSMENT AND PLAN / ED COURSE  ?I reviewed the triage vital signs and the nursing notes. ?             ?               ? ? ?The patient is on the cardiac monitor to evaluate for evidence of arrhythmia and/or significant heart rate changes. ? ? ?Ddx:  ?CHF, ESRD, ACS, PNA, COVID-19, symptomatic anemia, acidemia causing SOB ? ? ?MDM:  ?81 yo M with extensive PMHx including ESRD, CHF, CAD s/p AICD placement, AAA s/p repair, aortic arch aneurysm, here w/ SOB. Suspect acute on chronic CHF, possibly 2/2 ESRD w/ fluid overload versus anemia or some combination of above.DDx includes CAP. Labs, imaging sent. ? ?Lab work reviewed.  CBC shows hemoglobin of 6.7, which is significantly below his baseline.  I suspect this is primary etiology of his dyspnea.  CMP is largely reassuring, especially given that he is due for dialysis today.  Troponin 269 which I suspect is demand related.  Procalcitonin negative, he has no leukocytosis, no reported fever or infectious  symptoms, do not suspect pneumonia.  I suspect his chest x-ray is due to combination of edema and atelectasis.  He feels improved with oxygen supplementation.  Otherwise, discussed with Dr. Candiss Norse of nephrology who will take the patient to dialysis.  He will be transfused during dialysis.  Will admit to the hospitalist service. ? ? ?MEDICATIONS GIVEN IN ED: ?Medications  ?0.9 %  sodium chloride infusion (0 mL/hr Intravenous Hold 07/28/21 0915)  ? ? ? ?Consults:  ?Dr. Candiss Norse, nephrology, case discussed and  will take to dialysis urgently ?Dr. Francine Graven, hospitalist ? ? ?EMR reviewed  ?D/c summary from 06/2021, Dr. Jimmye Norman - admission for acute on chronic SOB, NSTEMI, likely 2/2 CHF and demand ischemia. Treated with volume management, improved. ? ? ? ? ?FINAL CLINICAL IMPRESSION(S) / ED DIAGNOSES  ? ?Final diagnoses:  ?Symptomatic anemia  ?Acute on chronic respiratory failure with hypoxemia (HCC)  ?ESRD (end stage renal disease) ([place])  ? ? ? ?Rx / DC Orders  ? ?ED Discharge Orders   ? ? None  ? ?  ? ? ? ?Note:  This document was prepared using Dragon voice recognition software and may include unintentional dictation errors. ?  ?Duffy Bruce, MD ?07/28/21 1000 ? ?

## 2021-07-28 NOTE — ED Notes (Signed)
RT called at this time to inform them of VBG sent down at this time on this pt. ?

## 2021-07-28 NOTE — Significant Event (Signed)
Rapid Response Event Note  ? ?Reason for Call : ?Increased work of breathing ? ?Initial Focused Assessment:  ?Rapid response RN arrived in patient's room with patient sitting up in bed surrounded by RNs, Dr. Francine Graven, and his visitor. He was on a non-rebreather mask with elevated respirations and very high work of breathing, agitated, unable to speak in a full sentence. Non-rebreather increased to 15L. See vital sign flowsheets. ? ?Interventions:  ?Patient placed on bipap by RT. Dr. Francine Graven ordered 80 mg of lasix which was administered. [place] RN started another IV. Patient's work of breathing better on bipap but still increased and patient kept report the bipap was giving him "too much air" even after RT adjusted settings. Dr. Francine Graven ordered one time dose of dilaudid IV. After which, patient was calm and relaxed. Work of breathing markedly better. ? ?Plan of Care:  ?Transported to ICU 5. Rodman Key RN gave bedside report to [facility]. ? ? ?Event Summary:  ? ?MD Notified: Dr. Francine Graven ?Call Time: 10:16 ?Arrival Time:10:18 ?End Time: 11:00 ? ?Stephanie Acre, RN ?

## 2021-07-28 NOTE — Assessment & Plan Note (Addendum)
Continue Celexa

## 2021-07-28 NOTE — Progress Notes (Signed)
?[facility]  ?ROUNDING NOTE  ? ?Subjective:  ? ?Peter [name] is a 81 year old male with past medical conditions including chronic systolic heart failure with EF 40 to 45%, chronic respiratory failure on 4 L nasal cannula, COPD, hypertension, CAD, and end-stage renal disease on hemodialysis.  Patient presents to the emergency department with complaints of shortness of breath progressively worse since yesterday.  Patient has been admitted for Acute respiratory failure ([place]) [J96.00] ?ESRD (end stage renal disease) ([place]) [N18.6] ?Symptomatic anemia [D64.9] ?Acute on chronic respiratory failure with hypoxemia ([place]) [J96.21] ? ?Patient is known to our practice and receives outpatient dialysis at [facility] on a MWF schedule, supervised by Dr. Holley Raring.  Patient has recently began dialysis treatments as of last month.  Last dialysis treatment received on Friday, received full treatment.  Also complains of increased fatigue.  Reports dark stools.  Denies chest or abdominal pain.  Denies use of NSAIDs.  Patient uses baseline 4 L oxygen, EMS for satting 80% on arrival.  Was given nebulizer and Solu-Medrol prior to transport to ED.  Patient seen in ED with family at bedside.  Appears very anxious stating he cannot breathe he is not getting enough oxygen.  Maintaining sats in 4 to 96% 4 L.  Denies nausea and vomiting. ? ?Labs on arrival glucose 109, BUN 38, creatinine 4.93 with GFR 11, albumin 3.1, troponin 269 with BNP greater than 3300, and hemoglobin 6.7.  Respiratory panel negative for influenza and COVID-19.  Chest x-ray shows emphysema. ? ?We have been consulted to manage dialysis needs during this admission ? ? ?Objective:  ?Vital signs in last 24 hours:  ?Temp:  [96.2 ?F (35.7 ?C)-97.6 ?F (36.4 ?C)] 96.2 ?F (35.7 ?C) (04/17 1120) ?Pulse Rate:  [30-104] 71 (04/17 1300) ?Resp:  [9-36] 22 (04/17 1300) ?BP: (128-164)/(60-103) 140/73 (04/17 1300) ?SpO2:  [88 %-100 %] 100 % (04/17 1300) ?Weight:  [90  kg-90.5 kg] 90.5 kg (04/17 1100) ? ?Weight change:  ?Filed Weights  ? 07/28/21 0818 07/28/21 1100  ?Weight: 90 kg 90.5 kg  ? ? ?Intake/Output: ?No intake/output data recorded. ?  ?Intake/Output this shift: ? No intake/output data recorded. ? ?Physical Exam: ?General: NAD, anxious, restless  ?Head: Normocephalic, atraumatic. Moist oral mucosal membranes  ?Eyes: Anicteric  ?Lungs:  Clear to auscultation, tachypneic, Friendly O2  ?Heart: Regular rate and rhythm  ?Abdomen:  Soft, nontender, nondistended  ?Extremities:  1+ peripheral edema.  ?Neurologic: Nonfocal, moving all four extremities  ?Skin: No lesions  ?Access: Right PermCath  ? ? ?Basic Metabolic Panel: ?Recent Labs  ?Lab 07/28/21 ?2706  ?NA 138  ?K 4.0  ?CL 103  ?CO2 25  ?GLUCOSE 109*  ?BUN 38*  ?CREATININE 4.93*  ?CALCIUM 8.5*  ? ? ?Liver Function Tests: ?Recent Labs  ?Lab 07/28/21 ?2376  ?AST 19  ?ALT 18  ?ALKPHOS 77  ?BILITOT 0.8  ?PROT 5.6*  ?ALBUMIN 3.1*  ? ?No results for input(s): LIPASE, AMYLASE in the last 168 hours. ?No results for input(s): AMMONIA in the last 168 hours. ? ?CBC: ?Recent Labs  ?Lab 07/28/21 ?2831  ?WBC 8.2  ?NEUTROABS 5.6  ?HGB 6.7*  ?HCT 21.7*  ?MCV 96.0  ?PLT 161  ? ? ?Cardiac Enzymes: ?No results for input(s): CKTOTAL, CKMB, CKMBINDEX, TROPONINI in the last 168 hours. ? ?BNP: ?Invalid input(s): POCBNP ? ?CBG: ?Recent Labs  ?Lab 07/28/21 ?1116  ?GLUCAP 174*  ? ? ?Microbiology: ?Results for orders placed or performed during the hospital encounter of 07/28/21  ?Resp Panel by RT-PCR (  Flu A&B, Covid) Nasopharyngeal Swab     Status: None  ? Collection Time: 07/28/21  8:24 AM  ? Specimen: Nasopharyngeal Swab; Nasopharyngeal(NP) swabs in vial transport medium  ?Result Value Ref Range Status  ? SARS Coronavirus 2 by RT PCR NEGATIVE NEGATIVE Final  ?  Comment: (NOTE) ?SARS-CoV-2 target nucleic acids are NOT DETECTED. ? ?The SARS-CoV-2 RNA is generally detectable in upper respiratory ?specimens during the acute phase of infection. The  lowest ?concentration of SARS-CoV-2 viral copies this assay can detect is ?138 copies/mL. A negative result does not preclude SARS-Cov-2 ?infection and should not be used as the sole basis for treatment or ?other patient management decisions. A negative result may occur with  ?improper specimen collection/handling, submission of specimen other ?than nasopharyngeal swab, presence of viral mutation(s) within the ?areas targeted by this assay, and inadequate number of viral ?copies(<138 copies/mL). A negative result must be combined with ?clinical observations, patient history, and epidemiological ?information. The expected result is Negative. ? ?Fact Sheet for Patients:  ?EntrepreneurPulse.com.au ? ?Fact Sheet for Healthcare Providers:  ?IncredibleEmployment.be ? ?This test is no t yet approved or cleared by the [place] FDA and  ?has been authorized for detection and/or diagnosis of SARS-CoV-2 by ?FDA under an Emergency Use Authorization (EUA). This EUA will remain  ?in effect (meaning this test can be used) for the duration of the ?COVID-19 declaration under Section 564(b)(1) of the Act, 21 ?U.S.C.section 360bbb-3(b)(1), unless the authorization is terminated  ?or revoked sooner.  ? ? ?  ? Influenza A by PCR NEGATIVE NEGATIVE Final  ? Influenza B by PCR NEGATIVE NEGATIVE Final  ?  Comment: (NOTE) ?The Xpert Xpress SARS-CoV-2/FLU/RSV plus assay is intended as an aid ?in the diagnosis of influenza from Nasopharyngeal swab specimens and ?should not be used as a sole basis for treatment. Nasal washings and ?aspirates are unacceptable for Xpert Xpress SARS-CoV-2/FLU/RSV ?testing. ? ?Fact Sheet for Patients: ?EntrepreneurPulse.com.au ? ?Fact Sheet for Healthcare Providers: ?IncredibleEmployment.be ? ?This test is not yet approved or cleared by the [place] FDA and ?has been authorized for detection and/or diagnosis of SARS-CoV-2 by ?FDA under  an Emergency Use Authorization (EUA). This EUA will remain ?in effect (meaning this test can be used) for the duration of the ?COVID-19 declaration under Section 564(b)(1) of the Act, 21 U.S.C. ?section 360bbb-3(b)(1), unless the authorization is terminated or ?revoked. ? ?Performed at [facility], [place], ?[place] [address] ?  ? ? ?Coagulation Studies: ?Recent Labs  ?  07/28/21 ?0824  ?LABPROT 14.5  ?INR 1.1  ? ? ?Urinalysis: ?No results for input(s): COLORURINE, LABSPEC, [place], GLUCOSEU, HGBUR, BILIRUBINUR, KETONESUR, PROTEINUR, UROBILINOGEN, NITRITE, LEUKOCYTESUR in the last 72 hours. ? ?Invalid input(s): APPERANCEUR  ? ? ?Imaging: ?DG Chest 2 View ? ?Result Date: 07/28/2021 ?CLINICAL DATA:  Shortness of breath EXAM: CHEST - 2 VIEW COMPARISON:  Chest x-rays dated 06/10/2021 and 05/28/2021. FINDINGS: Increased opacity at the RIGHT lung base. Coarse lung markings are again seen throughout both lungs. Scarring/emphysematous change at the bilateral lung apices. No pneumothorax is seen. LEFT chest wall pacemaker/ICD apparatus in place. RIGHT-sided central catheter appears appropriately positioned with tip at the level of the mid/lower SVC. Heart size and mediastinal contours are stable. IMPRESSION: 1. Increased opacity at the RIGHT lung base. This could represent pneumonia, aspiration, atelectasis or asymmetric pulmonary edema. Favor atelectasis given the elevation of the RIGHT hemidiaphragm suggesting associated volume loss. 2. Chronic interstitial lung disease/fibrosis. 3. Emphysema. Electronically Signed   By: Cherlynn Kaiser  Enriqueta Shutter M.D.   On: 07/28/2021 09:02   ? ? ?Medications:  ? ? sodium chloride Stopped (07/28/21 0915)  ? ? Chlorhexidine Gluconate Cloth  6 each Topical Q0600  ?  HYDROmorphone (DILAUDID) injection  0.5 mg Intravenous Once  ? mometasone-formoterol  2 puff Inhalation BID  ? pantoprazole (PROTONIX) IV  40 mg Intravenous Q24H  ? ? ? ?Assessment/ Plan:  ?Peter [name] is a  81 y.o.  male with past medical conditions including chronic systolic heart failure with EF 40 to 45%, chronic respiratory failure on 4 L nasal cannula, COPD, hypertension, CAD, and end-stage renal disease on hem

## 2021-07-28 NOTE — Consult Note (Signed)
? ?NAME:  Peter [name], MRN:  [id number], DOB:  1940-10-15, LOS: 0 ?ADMISSION DATE:  07/28/2021, CONSULTATION DATE:  07/28/2021 ?REFERRING MD:  Dr.Agbata, CHIEF COMPLAINT:  Shortness of Breath  ? ?Brief Pt Description / Synopsis:  ?81 y.o. Male admitted with Acute on Chronic Hypoxic Respiratory Failure in the setting of Symptomatic Anemia, volume overload, atelectasis, & questionable AECOPD requiring BiPAP. ? ?History of Present Illness:  ?Peter [name] is a 81 year old male with a past medical history significant for COPD requiring 4 L of supplemental O2 chronically, ESRD on hemodialysis, HFrEF (LVEF 40 to 45%), ischemic cardiomyopathy, hypertension, coronary artery disease who presented to [facility] ED on 07/28/2021 due to progressive shortness of breath and fatigue/weakness. ? ?He reports that at baseline he is usually short of breath, however this is worsened over the last 2 to 3 days.  He reports associated wheezing and nonproductive cough.  He had his pulse ox checked which was 80% on 4 L.  He denies fever or chills, worsening of bilateral lower extremity edema, chest pain, nausea, vomiting, diarrhea, abdominal pain, palpitations..  He does report symptoms are worse when laying flat and with any form of exertion.  He reports he has been compliant and not missed any scheduled dialysis appointment, which she completed his last session on Friday 4/14.  He reported dark stools, but denied hematochezia or hematemesis. ? ?EMS gave Solu-Medrol in route to the ED.  Given his work of breathing, he was placed on BiPAP in the ED. ? ?ED Course: ?Initial vital signs: Temperature 97.6, respiratory rate 20, pulse 80, blood pressure 147/90, 96% ?Significant Labs: Glucose 109, BUN 38, creatinine 4.93, BNP 3382, high-sensitivity troponin 269, procalcitonin less than 0.1, WBC 8.2, hemoglobin 6.7, hematocrit 21.7 ?COVID-19 PCR negative ?Imaging: ?Chest x-ray>>IMPRESSION: ?1. Increased opacity at the RIGHT lung base. This could  represent ?pneumonia, aspiration, atelectasis or asymmetric pulmonary edema. ?Favor atelectasis given the elevation of the RIGHT hemidiaphragm ?suggesting associated volume loss. ?2. Chronic interstitial lung disease/fibrosis. ?3. Emphysema. ?Medications given: 80 mg IV Lasix, 0.5 IV Dilaudid ? ?Hospitalist admitting the patient to stepdown unit, PCCM asked to consult for assistance with acute on chronic hypoxic respiratory failure requiring BiPAP. ? ?Pertinent  Medical History  ?COPD requiring 4 L of oxygen chronically ?ESRD on hemodialysis ?HFrEF (LVEF 40 to 45%) ?Ischemic cardiomyopathy ?Coronary artery disease ?Hypertension ? ?Micro Data:  ?4/17: SARS-CoV-2 and influenza PCR>> negative ? ?Antimicrobials:  ?N/A ? ?Significant Hospital Events: ?Including procedures, antibiotic start and stop dates in addition to other pertinent events   ?4/17: Presented to ED with shortness of breath, placed on BiPAP.  Admitted by the hospitalist, PCCM consulted.  To receive 1 unit of blood with hemodialysis today ? ?Interim History / Subjective:  ?-Patient seen in stepdown unit ?-On BiPAP, tolerating well, no acute distress, reports his breathing is much improved on BiPAP ?-Afebrile, hemodynamically stable, no vasopressors ?-Plan for dialysis at bedside in stepdown unit, to receive 1 unit PRBCs with dialysis ? ?Objective   ?Blood pressure 132/60, pulse 70, temperature (!) 96.6 ?F (35.9 ?C), temperature source Axillary, resp. rate 14, height '5\' 11"'$  (1.803 m), weight 90.5 kg, SpO2 96 %. ?   ?   ?No intake or output data in the 24 hours ending 07/28/21 1113 ?Filed Weights  ? 07/28/21 0818 07/28/21 1100  ?Weight: 90 kg 90.5 kg  ? ? ?Examination: ?General: Acute on chronically ill-appearing male, sitting in bed, on BiPAP, no acute distress ?HENT: Atraumatic, normocephalic, neck supple, difficult to assess JVD due  to BiPAP ?Lungs: Clear breath sounds bilaterally with bibasilar Rales, BiPAP assisted, even, nonlabored ?Cardiovascular:  Paced rhythm, no murmurs, rubs, gallops ?Abdomen: Soft, nontender, nondistended, no guarding rebound tenderness, bowel sounds positive x4 ?Extremities: No deformities, 2+ edema bilateral lower extremities ?Neuro: Awake, alert and oriented x3, follows commands, no focal deficits, answers questions appropriately ?GU: Patient is oliguric at baseline ? ?Resolved Hospital Problem list   ? ? ?Assessment & Plan:  ? ?Acute on Chronic Hypoxic Respiratory Failure in the setting of symptomatic anemia, volume overload, Atelectasis &  ? AECOPD ?-Supplemental O2 as needed to maintain O2 sats 88 to 92% ?-BiPAP, wean as tolerated ?-Follow intermittent Chest X-ray & ABG as needed ?-Bronchodilators & Pulmicort nebs ?-Volume removal with HD ?-Received 80 mg IV Lasix x1 dose in ED ?-Pulmonary toilet as able ?-Procalcitonin is negative and NO leukocytosis ? ?Symptomatic Anemia ?-Monitor for S/Sx of bleeding ?-Trend CBC ?-SCD's for VTE Prophylaxis  ?-Transfuse for Hgb <8 ?-To receive 1 unit pRBC's on 4/17 with Dialysis ? ?Mildly Elevated Troponin, suspect demand ischemia ?New onset Atrial Fibrillation ?Chronic HFrEF (LVEF 40-45% on Echo 10/22) ?PMHx: Ischemic cardiomyopathy ?-Continuous cardiac monitoring ?-Maintain MAP >65 ?-Trend HS Troponin until peaked ?-Consider repeat Echocardiogram  ?-No anticoagulation or antiplatelets given anemia ?-Cardiology following, appreciate input ? ?ESRD on Hemodialysis ?-Monitor I&O's / urinary output ?-Follow BMP ?-Ensure adequate renal perfusion ?-Avoid nephrotoxic agents as able ?-Replace electrolytes as indicated ?-Nephrology consulted, appreciate input ~HD as per nephrology ? ? ? ?Best Practice (right click and "Reselect all SmartList Selections" daily)  ? ?Diet/type: NPO ?DVT prophylaxis: SCD ?GI prophylaxis: N/A ?Lines: N/A ?Foley:  N/A ?Code Status:  full code ?Last date of multidisciplinary goals of care discussion [N/A] ? ?Labs   ?CBC: ?Recent Labs  ?Lab 07/28/21 ?1610  ?WBC 8.2  ?NEUTROABS  5.6  ?HGB 6.7*  ?HCT 21.7*  ?MCV 96.0  ?PLT 161  ? ? ?Basic Metabolic Panel: ?Recent Labs  ?Lab 07/28/21 ?9604  ?NA 138  ?K 4.0  ?CL 103  ?CO2 25  ?GLUCOSE 109*  ?BUN 38*  ?CREATININE 4.93*  ?CALCIUM 8.5*  ? ?GFR: ?Estimated Creatinine Clearance: 13.5 mL/min (A) (by C-G formula based on SCr of 4.93 mg/dL (H)). ?Recent Labs  ?Lab 07/28/21 ?5409  ?PROCALCITON <0.10  ?WBC 8.2  ? ? ?Liver Function Tests: ?Recent Labs  ?Lab 07/28/21 ?8119  ?AST 19  ?ALT 18  ?ALKPHOS 77  ?BILITOT 0.8  ?PROT 5.6*  ?ALBUMIN 3.1*  ? ?No results for input(s): LIPASE, AMYLASE in the last 168 hours. ?No results for input(s): AMMONIA in the last 168 hours. ? ?ABG ?   ?Component Value Date/Time  ? HCO3 24.8 07/28/2021 0825  ? ACIDBASEDEF 0.4 07/28/2021 0825  ? O2SAT 65.4 07/28/2021 0825  ?  ? ?Coagulation Profile: ?Recent Labs  ?Lab 07/28/21 ?1478  ?INR 1.1  ? ? ?Cardiac Enzymes: ?No results for input(s): CKTOTAL, CKMB, CKMBINDEX, TROPONINI in the last 168 hours. ? ?HbA1C: ?Hgb A1c MFr Bld  ?Date/Time Value Ref Range Status  ?06/10/2021 07:14 PM 5.2 4.8 - 5.6 % Final  ?  Comment:  ?  (NOTE) ?        Prediabetes: 5.7 - 6.4 ?        Diabetes: >6.4 ?        Glycemic control for adults with diabetes: <7.0 ?  ? ? ?CBG: ?No results for input(s): GLUCAP in the last 168 hours. ? ?Review of Systems:   ?Positives in BOLD: ?Gen: Denies fever, chills, weight change, fatigue,  night sweats ?HEENT: Denies blurred vision, double vision, hearing loss, tinnitus, sinus congestion, rhinorrhea, sore throat, neck stiffness, dysphagia ?PULM: Denies shortness of breath, cough, sputum production, hemoptysis, wheezing ?CV: Denies chest pain, edema, orthopnea, paroxysmal nocturnal dyspnea, palpitations ?GI: Denies abdominal pain, nausea, vomiting, diarrhea, hematochezia, melena, constipation, change in bowel habits ?GU: Denies dysuria, hematuria, polyuria, oliguria, urethral discharge ?Endocrine: Denies hot or cold intolerance, polyuria, polyphagia or appetite  change ?Derm: Denies rash, dry skin, scaling or peeling skin change ?Heme: Denies easy bruising, bleeding, bleeding gums ?Neuro: Denies headache, numbness, weakness, slurred speech, loss of memory or consciousness ? ? ?Pa

## 2021-07-28 NOTE — H&P (Addendum)
?History and Physical  ? ? ?Patient: Peter [name] WUJ:[id number] DOB: 05/08/1940 ?DOA: 07/28/2021 ?DOS: the patient was seen and examined on 07/28/2021 ?PCP: Tracie Harrier, MD  ?Patient coming from: Home ? ?Chief Complaint:  ?Chief Complaint  ?Patient presents with  ? Shortness of Breath  ? ?HPI: Peter [name] is a 81 y.o. male with medical history significant for ischemic cardiomyopathy with last known LVEF of 40 to 95%, chronic systolic heart failure, end-stage renal disease on hemodialysis (M/W/F), COPD with chronic respiratory failure on 4 L of oxygen, coronary artery disease, hypertension who presents to the ER for evaluation of worsening shortness of breath over the last 24 hours. ?At baseline he is usually short of breath but this got worse over the last 24 hours and is associated with a nonproductive cough.  He denies having any fever or chills.  He has bilateral lower extremity swelling which is unchanged.  Symptoms are worse when he lays flat or with any form of exertion. ?He has not missed any of his scheduled dialysis appointments and completed his dialysis session on Friday 07/25/21. ?His stools have been dark but he denies having any hematochezia or hematemesis.  He denies NSAID use.  He had a recent upper endoscopy during one of his hospitalizations and was found to have gastritis. ?He denies having any chest pain, no nausea, no vomiting, no diaphoresis, no palpitations, no focal deficits or blurred vision. ?EMS was called due to his worsening shortness of breath and he was found to have pulse oximetry of 80% on his 4 L.  He received a dose of Solu-Medrol and nebulizer treatment and was transported to the ER. ?Rapid response was called on this patient due to worsening shortness of breath and he is currently on a BiPAP.  He also appeared to be very anxious and received a dose of hydromorphone 0.5 mg x 1 ?Review of Systems: As mentioned in the history of present illness. All other systems  reviewed and are negative. ?Past Medical History:  ?Diagnosis Date  ? Anginal pain ([place])   ? Asthma   ? Bladder infection, acute 03/2011  ? "had a whole lot of bleeding from this"  ? CHF (congestive heart failure) ([place])   ? Coronary artery disease   ? High cholesterol   ? Hypertension   ? 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 "had it in my right; getting shots now in my left"  ? Myocardial infarction [facility]) 1996  ? NSTEMI (non-ST elevated myocardial infarction) ([place]) 12/14/2011  ? Shortness of breath 12/14/2011  ? "@ rest, lying down, w/exertion"  ? ?Past Surgical History:  ?Procedure Laterality Date  ? [place]; ~ 2003; ~ 2008  ? "total of 3"  ? CORONARY STENT INTERVENTION N/A 08/10/2016  ? Procedure: Coronary Stent Intervention;  Surgeon: [name], MD;  Location: [place] [facility];  Service: Cardiovascular;  Laterality: N/A;  ? DIALYSIS/PERMA CATHETER INSERTION N/A 06/17/2021  ? Procedure: DIALYSIS/PERMA CATHETER INSERTION;  Surgeon: Katha Cabal, MD;  Location: [place] [facility];  Service: Cardiovascular;  Laterality: N/A;  ? HERNIA REPAIR  ~ 2001  ? "abdominal w/mesh implanted"  ? LEFT HEART CATH AND CORONARY ANGIOGRAPHY N/A 08/10/2016  ? Procedure: Left Heart Cath and Coronary Angiography;  Surgeon: [name], MD;  Location: [place] [facility];  Service: Cardiovascular;  Laterality: N/A;  ? LEFT HEART CATHETERIZATION WITH CORONARY ANGIOGRAM N/A 12/16/2011  ? Procedure: LEFT HEART CATHETERIZATION WITH CORONARY ANGIOGRAM;  Surgeon:  Minus Breeding, MD;  Location: [facility] CATH LAB;  Service: Cardiovascular;  Laterality: N/A;  ? PERCUTANEOUS CORONARY STENT INTERVENTION (PCI-S) N/A 12/17/2011  ? Procedure: PERCUTANEOUS CORONARY STENT INTERVENTION (PCI-S);  Surgeon: Sherren Mocha, MD;  Location: [facility] CATH LAB;  Service: Cardiovascular;  Laterality: N/A;  ? TONSILLECTOMY    ? "I was a kid"  ? ?Social History:  reports that he has quit smoking. His smoking use included  cigarettes. He has a 0.25 pack-year smoking history. He has never used smokeless tobacco. He reports current alcohol use of about 1.0 standard drink per week. He reports that he does not use drugs. ? ?Allergies  ?Allergen Reactions  ? Iodinated Contrast Media Shortness Of Breath  ? Iodinated Glycerol  [Glycerol, Iodinated] Shortness Of Breath  ? ? ?Family History  ?Family history unknown: Yes  ? ? ?Prior to Admission medications   ?Medication Sig Start Date End Date Taking? Authorizing Provider  ?amLODipine (NORVASC) 10 MG tablet Take 1 tablet (10 mg total) by mouth daily. 06/03/21 09/01/21  Enzo Bi, MD  ?atorvastatin (LIPITOR) 40 MG tablet Take 1 tablet (40 mg total) by mouth daily at 6 PM. 12/18/11   Annita Brod, MD  ?calcitRIOL (ROCALTROL) 0.25 MCG capsule Take 0.25 mcg by mouth daily.    [provider]  ?carvedilol (COREG) 6.25 MG tablet Take 1 tablet (6.25 mg total) by mouth 2 (two) times daily with a meal. 06/03/21 09/01/21  Enzo Bi, MD  ?cetirizine (ZYRTEC) 10 MG tablet Take 10 mg by mouth.    [provider]  ?citalopram (CELEXA) 20 MG tablet Take 20 mg by mouth daily. 05/22/21   [provider]  ?clobetasol cream (TEMOVATE) 6.44 % Apply 1 application topically 2 (two) times daily as needed (psoriasis).    [provider]  ?clopidogrel (PLAVIX) 75 MG tablet Hold until outpatient followup with cardiology due to worsening anemia. ?Patient not taking: Reported on 06/05/2021 06/03/21   Enzo Bi, MD  ?cyanocobalamin 1000 MCG tablet Take 1,000 mcg by mouth daily.     [provider]  ?finasteride (PROSCAR) 5 MG tablet Take 1 tablet (5 mg total) by mouth daily. 06/03/21 09/01/21  Enzo Bi, MD  ?GLUCOSAMINE-CHONDROITIN-VIT C PO Take 1 tablet by mouth daily.    [provider]  ?hydrALAZINE (APRESOLINE) 100 MG tablet Take 1 tablet (100 mg total) by mouth 3 (three) times daily. 06/03/21 09/01/21  Enzo Bi, MD  ?isosorbide mononitrate (IMDUR) 30 MG 24 hr tablet  Take 1 tablet (30 mg total) by mouth daily. 06/03/21 09/01/21  Enzo Bi, MD  ?omeprazole (PRILOSEC) 20 MG capsule Take 20 mg by mouth in the morning.    [provider]  ?spironolactone (ALDACTONE) 25 MG tablet Hold until followup with your outpatient doctor due to worsening kidney function. ?Patient not taking: Reported on 06/05/2021 06/03/21   Enzo Bi, MD  ?tamsulosin (FLOMAX) 0.4 MG CAPS capsule Take 1 capsule (0.4 mg total) by mouth daily. 06/03/21 09/01/21  Enzo Bi, MD  ?umeclidinium bromide (INCRUSE ELLIPTA) 62.5 MCG/ACT AEPB Inhale 1 puff into the lungs daily. 06/03/21 09/01/21  Enzo Bi, MD  ? ? ?Physical Exam: ?Vitals:  ? 07/28/21 0830 07/28/21 0900 07/28/21 0930 07/28/21 1100  ?BP: (!) 150/74 (!) 144/70 132/60   ?Pulse: 70 68 70   ?Resp: '10 17 14   '$ ?Temp:    (!) 96.6 ?F (35.9 ?C)  ?TempSrc:    Axillary  ?SpO2: 96% 97% 96%   ?Weight:    90.5 kg  ?Height:  $'5\' 11"'P$  (1.803 m)  ? ?Physical Exam ?Vitals and nursing note reviewed.  ?Constitutional:   ?   General: He is in acute distress.  ?   Comments: Noted to be in respiratory distress.  Awake and alert.  Chronically ill-appearing  ?HENT:  ?   Head: Normocephalic and atraumatic.  ?Eyes:  ?   Comments: Pale conjunctiva  ?Cardiovascular:  ?   Rate and Rhythm: Normal rate and regular rhythm.  ?Pulmonary:  ?   Effort: Tachypnea and accessory muscle usage present.  ?   Comments: Coarse breath sounds in both lung fields.  Scattered expiratory wheezes ?Abdominal:  ?   General: Bowel sounds are normal.  ?   Palpations: Abdomen is soft.  ?Musculoskeletal:     ?   General: Normal range of motion.  ?   Cervical back: Normal range of motion and neck supple.  ?   Right lower leg: Edema present.  ?   Left lower leg: Edema present.  ?Skin: ?   General: Skin is warm and dry.  ?   Comments: Ecchymosis involving his arms  ?Neurological:  ?   Motor: Weakness present.  ?Psychiatric:     ?   Mood and Affect: Mood is anxious.  ? ? ?Data Reviewed: ?Relevant notes from primary  care and specialist visits, past discharge summaries as available in EHR, including Care Everywhere. ?Prior diagnostic testing as pertinent to current admission diagnoses ?Updated medications and problem list

## 2021-07-28 NOTE — Progress Notes (Signed)
Hemodialysis Post Treatment Note: ? ?Tx date:07/28/2021 ?Tx time: 3 hours ?Access: right CVC ?UF Removed:553m ? ?Note: ? ?HD treatment completed, tolerated well, no complications.  ? ?Blood transfusion done during treatment.  Completed and no reaction. ? ?Patient asymptomatic. ? ? ? ? ? ? ? ? ? ?  ?

## 2021-07-28 NOTE — Assessment & Plan Note (Addendum)
Patient has an underlying history of COPD with chronic respiratory failure and is on 4 L of oxygen continuous. ?He presents for evaluation of worsening shortness of breath from his baseline associated with tachypnea and accessory muscle use ?Initially required BiPAP while in the stepdown and now weaned off to 2 L oxygen by nasal cannula ?

## 2021-07-28 NOTE — Assessment & Plan Note (Addendum)
Dialysis days are M/W/F ?Dialysis per nephro while in hospital ?

## 2021-07-28 NOTE — ED Triage Notes (Signed)
From [facility] via [place]. Baseline 4L [place]. This morning pt reports difficulty breathing, per [facility] pt was 80s% on baseline [place]. Given solumedrol and neb tx. Pt due for dialysis today. Hx COPD, reports he has been having trouble with it since March 14, 2021. Pt reports he has pacemaker and de-fib. ?MWF dialysis, pt went Friday  ? ?149/76 ?BGL 151 ?Hr 76 ? ?

## 2021-07-28 NOTE — Assessment & Plan Note (Addendum)
Patient has a known history of ischemic cardiomyopathy with last known LVEF of 40 to 45%, 2D echocardiogram which was done 10/22. Continue carvedilol and nitrates ?Hold Plavix for now due to symptomatic anemia ?

## 2021-07-28 NOTE — Progress Notes (Signed)
?   07/28/21 1600  ?Clinical Encounter Type  ?Visited With Patient and family together  ?Visit Type Initial  ?Referral From Chaplain  ?Consult/Referral To Chaplain  ? ?Chaplain provided compassionate presence and reflective listening for patient and daughter [name]. Patient and daughter engaged in conversation about patient's recent medical journey. Patient and family appreciated Chaplain visit. ?

## 2021-07-28 NOTE — Assessment & Plan Note (Addendum)
New onset A-fib ?Patient has a CHA2DS2-VASc score of 5 and ideally will require long-term anticoagulation as primary prophylaxis for an acute stroke ?Continue carvedilol for rate control ?Patient not a candidate for anticoagulation at this time due to symptomatic anemia most likely from acute blood loss ? cardiology input appreciated ?

## 2021-07-28 NOTE — ED Notes (Signed)
New type and screen sent to lab at this time ?

## 2021-07-28 NOTE — ED Notes (Signed)
Troponin of 269 called to this RN by lab at this time. MD Lauris Chroman at this time.  ?

## 2021-07-29 DIAGNOSIS — K921 Melena: Secondary | ICD-10-CM | POA: Diagnosis not present

## 2021-07-29 DIAGNOSIS — N186 End stage renal disease: Secondary | ICD-10-CM

## 2021-07-29 DIAGNOSIS — R778 Other specified abnormalities of plasma proteins: Secondary | ICD-10-CM

## 2021-07-29 DIAGNOSIS — J9600 Acute respiratory failure, unspecified whether with hypoxia or hypercapnia: Secondary | ICD-10-CM | POA: Diagnosis not present

## 2021-07-29 DIAGNOSIS — J439 Emphysema, unspecified: Secondary | ICD-10-CM | POA: Diagnosis not present

## 2021-07-29 DIAGNOSIS — I4821 Permanent atrial fibrillation: Secondary | ICD-10-CM

## 2021-07-29 LAB — CBC
HCT: 21.1 % — ABNORMAL LOW (ref 39.0–52.0)
Hemoglobin: 6.8 g/dL — ABNORMAL LOW (ref 13.0–17.0)
MCH: 29.7 pg (ref 26.0–34.0)
MCHC: 32.2 g/dL (ref 30.0–36.0)
MCV: 92.1 fL (ref 80.0–100.0)
Platelets: 139 10*3/uL — ABNORMAL LOW (ref 150–400)
RBC: 2.29 MIL/uL — ABNORMAL LOW (ref 4.22–5.81)
RDW: 16.6 % — ABNORMAL HIGH (ref 11.5–15.5)
WBC: 8.8 10*3/uL (ref 4.0–10.5)
nRBC: 0 % (ref 0.0–0.2)

## 2021-07-29 LAB — MAGNESIUM: Magnesium: 1.6 mg/dL — ABNORMAL LOW (ref 1.7–2.4)

## 2021-07-29 LAB — HEMOGLOBIN AND HEMATOCRIT, BLOOD
HCT: 21.9 % — ABNORMAL LOW (ref 39.0–52.0)
HCT: 23.2 % — ABNORMAL LOW (ref 39.0–52.0)
Hemoglobin: 6.9 g/dL — ABNORMAL LOW (ref 13.0–17.0)
Hemoglobin: 7.5 g/dL — ABNORMAL LOW (ref 13.0–17.0)

## 2021-07-29 LAB — BASIC METABOLIC PANEL
Anion gap: 8 (ref 5–15)
BUN: 27 mg/dL — ABNORMAL HIGH (ref 8–23)
CO2: 28 mmol/L (ref 22–32)
Calcium: 8.2 mg/dL — ABNORMAL LOW (ref 8.9–10.3)
Chloride: 100 mmol/L (ref 98–111)
Creatinine, Ser: 3.41 mg/dL — ABNORMAL HIGH (ref 0.61–1.24)
GFR, Estimated: 17 mL/min — ABNORMAL LOW (ref >60)
Glucose, Bld: 120 mg/dL — ABNORMAL HIGH (ref 70–99)
Potassium: 3.9 mmol/L (ref 3.5–5.1)
Sodium: 136 mmol/L (ref 135–145)

## 2021-07-29 LAB — PHOSPHORUS: Phosphorus: 4.2 mg/dL (ref 2.5–4.6)

## 2021-07-29 LAB — PREPARE RBC (CROSSMATCH)

## 2021-07-29 LAB — HEPATITIS B SURFACE ANTIBODY, QUANTITATIVE: Hep B S AB Quant (Post): <3.1 m[IU]/mL — ABNORMAL LOW (ref >9.9)

## 2021-07-29 MED ORDER — MAGNESIUM SULFATE IN D5W 1-5 GM/100ML-% IV SOLN
1.0000 g | Freq: Once | INTRAVENOUS | Status: AC
  Administered 2021-07-29: 1 g via INTRAVENOUS
  Filled 2021-07-29: qty 100

## 2021-07-29 MED ORDER — EPOETIN ALFA 10000 UNIT/ML IJ SOLN
8000.0000 [IU] | INTRAMUSCULAR | Status: DC
  Administered 2021-07-30 – 2021-08-04 (×2): 8000 [IU] via INTRAVENOUS
  Filled 2021-07-29 (×2): qty 1

## 2021-07-29 MED ORDER — SODIUM CHLORIDE 0.9% IV SOLUTION: Freq: Once | INTRAVENOUS | Status: AC

## 2021-07-29 MED ORDER — ALPRAZOLAM 0.25 MG PO TABS
0.2500 mg | ORAL_TABLET | Freq: Two times a day (BID) | ORAL | Status: DC | PRN
  Administered 2021-07-29 – 2021-07-31 (×6): 0.25 mg via ORAL
  Filled 2021-07-29 (×6): qty 1

## 2021-07-29 NOTE — Progress Notes (Signed)
?Progress Note ? ? ?Patient: Peter [name] NOB:[id number] DOB: [birth year] DOA: 07/28/2021     1 ?DOS: the patient was seen and examined on 07/29/2021 ?  ?Brief hospital course: ? 81 y.o. male with medical history significant for ischemic cardiomyopathy with last known LVEF of 40 to 94%, chronic systolic heart failure, end-stage renal disease on hemodialysis (M/W/F), COPD with chronic respiratory failure on 4 L of oxygen, coronary artery disease, hypertension admitted for worsening shortness of breath and anemia ? ?4/18: 1 PRBC transfusion for hemoglobin of 6.9.  GI consult.  He received 1 PRBC yesterday for hemoglobin of 6.7 on admission ? ? ?Assessment and Plan: ?* Acute respiratory failure ([place]) ?Patient has an underlying history of COPD with chronic respiratory failure and is on 4 L of oxygen continuous. ?He presents for evaluation of worsening shortness of breath from his baseline associated with tachypnea and accessory muscle use ?Initially required BiPAP while in the stepdown and now weaned off to 2 L oxygen by nasal cannula ? ?Symptomatic anemia ?Patient presents to the ER for evaluation of worsening shortness of breath from his baseline and noted to have a hemoglobin of 6.7 ?His baseline hemoglobin is around 8 ?Patient states that he has had melena stools and chart review shows he had a recent upper endoscopy done in February which revealed gastritis. ?His Plavix has been on hold ?Continue IV Protonix.  GI consult ?Hemoglobin 6.9 this morning we will transfuse 1 more PRBC today ? ?Atrial fibrillation ([place]) ?New onset A-fib ?Patient has a CHA2DS2-VASc score of 5 and ideally will require long-term anticoagulation as primary prophylaxis for an acute stroke ?Continue carvedilol for rate control ?Patient not a candidate for anticoagulation at this time due to symptomatic anemia most likely from acute blood loss ? cardiology input appreciated ? ?Ischemic cardiomyopathy ?Patient has a known history of ischemic  cardiomyopathy with last known LVEF of 40 to 45%, 2D echocardiogram which was done 10/22. Continue carvedilol and nitrates ?Hold Plavix for now due to symptomatic anemia ? ?ESRD (end stage renal disease) ([place]) ?Dialysis days are M/W/F ?Dialysis per nephro while in hospital ? ?Depression ?Continue Celexa ? ?COPD (chronic obstructive pulmonary disease) with emphysema ([place]) ?Continue scheduled and as needed bronchodilator therapy ?Continue inhaled steroids ?On 2 L oxygen via nasal cannula now.  Wean as able ? ?Elevated troponin ?Due to demand ischemia from anemia ?Unable to administer any antiplatelets for now due to symptomatic anemia ? ? ? ? ?  ? ?Subjective: Shortness of breath improving.  Daughter at bedside ? ?Physical Exam: ?Vitals:  ? 07/29/21 1600 07/29/21 1652 07/29/21 2030 07/29/21 2040  ?BP:  (!) 114/55  (!) 126/58  ?Pulse:  65  62  ?Resp:  14  16  ?Temp: 97.7 ?F (36.5 ?C) 97.7 ?F (36.5 ?C) 97.8 ?F (36.6 ?C)   ?TempSrc: Axillary Axillary Oral   ?SpO2:  97%  99%  ?Weight:      ?Height:      ? ?81 year old male lying in the bed comfortably without any acute distress ?Lungs Rales at the bases.  No wheezing ?Cardiovascular irregularly irregular heart sounds, no murmur rubs or gallop ?Abdomen soft, benign ?Skin no rash or lesion ?Neuro alert and oriented, nonfocal ? ?Data Reviewed: ? ?Hemoglobin 6.9 ? ?Family Communication: Updated daughter at bedside ? ?Disposition: ?Status is: Inpatient ?Remains inpatient appropriate because: Management of anemia and respiratory failure ? ? Planned Discharge Destination: Home with Home Health ? ? ? DVT prophylaxis-SCDs ?Time spent: 35 minutes ? ?Author: ?Max Sane,  MD ?07/29/2021 10:18 PM ? ?For on call review [email or web address].  ?

## 2021-07-29 NOTE — Progress Notes (Signed)
?[facility]  ?ROUNDING NOTE  ? ?Subjective:  ? ?Peter [name] is a 81 year old male with past medical conditions including chronic systolic heart failure with EF 40 to 45%, chronic respiratory failure on 4 L nasal cannula, COPD, hypertension, CAD, and end-stage renal disease on hemodialysis.  Patient presents to the emergency department with complaints of shortness of breath progressively worse since yesterday.  Patient has been admitted for Acute respiratory failure ([place]) [J96.00] ?ESRD (end stage renal disease) ([place]) [N18.6] ?Symptomatic anemia [D64.9] ?Acute on chronic respiratory failure with hypoxemia ([place]) [J96.21] ? ?Patient is known to our practice and receives outpatient dialysis at [facility] on a MWF schedule, supervised by Dr. Holley Raring.  Patient has recently began dialysis treatments as of last month.  Last dialysis treatment received on Friday, received full treatment.  Also complains of increased fatigue.  Reports dark stools.  Denies chest or abdominal pain.  Denies use of NSAIDs.  Patient uses baseline 4 L oxygen, EMS for satting 80% on arrival.  Was given nebulizer and Solu-Medrol prior to transport to ED.  Patient seen in ED with family at bedside.  Appears very anxious stating he cannot breathe he is not getting enough oxygen.  Maintaining sats in 4 to 96% 4 L.  Denies nausea and vomiting. ? ?Hgb has dropped again today despite transfusion yesterday ?500 cc removed with HD yesterday ? ? ?Objective:  ?Vital signs in last 24 hours:  ?Temp:  [96.1 ?F (35.6 ?C)-99.2 ?F (37.3 ?C)] 98.6 ?F (37 ?C) (04/18 0314) ?Pulse Rate:  [30-104] 76 (04/18 0811) ?Resp:  [9-36] 21 (04/18 9509) ?BP: (123-164)/(59-103) 135/67 (04/18 0314) ?SpO2:  [88 %-100 %] 96 % (04/18 0811) ?FiO2 (%):  [30 %-50 %] 30 % (04/17 1945) ?Weight:  [87.9 kg-90.5 kg] 87.9 kg (04/17 1915) ? ?Weight change:  ?Filed Weights  ? 07/28/21 1100 07/28/21 1500 07/28/21 1915  ?Weight: 90.5 kg 88.4 kg 87.9 kg   ? ? ?Intake/Output: ?I/O last 3 completed shifts: ?In: 480 [P.O.:120; Blood:360] ?Out: 500 [Other:500] ?  ?Intake/Output this shift: ? No intake/output data recorded. ? ?Physical Exam: ?General: NAD, anxious, restless  ?Head: Normocephalic, atraumatic. Moist oral mucosal membranes  ?Eyes: Anicteric  ?Lungs:  Normal effort, tachypneic,  O2  ?Heart: Regular rate and rhythm  ?Abdomen:  Soft, nontender, nondistended  ?Extremities:  1+ peripheral edema.  ?Neurologic: Nonfocal, moving all four extremities  ?Skin: No lesions  ?Access: Right PermCath  ? ? ?Basic Metabolic Panel: ?Recent Labs  ?Lab 07/28/21 ?0824 07/28/21 ?1549 07/29/21 ?0439  ?NA 138 137 136  ?K 4.0 4.6 3.9  ?CL 103 102 100  ?CO2 '25 25 28  '$ ?GLUCOSE 109* 155* 120*  ?BUN 38* 44* 27*  ?CREATININE 4.93* 5.54* 3.41*  ?CALCIUM 8.5* 8.7* 8.2*  ?MG  --   --  1.6*  ?PHOS  --  5.2* 4.2  ? ? ? ?Liver Function Tests: ?Recent Labs  ?Lab 07/28/21 ?0824 07/28/21 ?1549  ?AST 19  --   ?ALT 18  --   ?ALKPHOS 77  --   ?BILITOT 0.8  --   ?PROT 5.6*  --   ?ALBUMIN 3.1* 3.1*  ? ? ?No results for input(s): LIPASE, AMYLASE in the last 168 hours. ?No results for input(s): AMMONIA in the last 168 hours. ? ?CBC: ?Recent Labs  ?Lab 07/28/21 ?0824 07/28/21 ?1549 07/28/21 ?2111 07/29/21 ?0439  ?WBC 8.2 8.6  --  8.8  ?NEUTROABS 5.6  --   --   --   ?HGB 6.7* 6.8* 7.4*  6.8*  ?HCT 21.7* 22.2* 23.5* 21.1*  ?MCV 96.0 95.7  --  92.1  ?PLT 161 151  --  139*  ? ? ? ?Cardiac Enzymes: ?No results for input(s): CKTOTAL, CKMB, CKMBINDEX, TROPONINI in the last 168 hours. ? ?BNP: ?Invalid input(s): POCBNP ? ?CBG: ?Recent Labs  ?Lab 07/28/21 ?1116  ?GLUCAP 174*  ? ? ? ?Microbiology: ?Results for orders placed or performed during the hospital encounter of 07/28/21  ?Resp Panel by RT-PCR (Flu A&B, Covid) Nasopharyngeal Swab     Status: None  ? Collection Time: 07/28/21  8:24 AM  ? Specimen: Nasopharyngeal Swab; Nasopharyngeal(NP) swabs in vial transport medium  ?Result Value Ref Range Status  ? SARS  Coronavirus 2 by RT PCR NEGATIVE NEGATIVE Final  ?  Comment: (NOTE) ?SARS-CoV-2 target nucleic acids are NOT DETECTED. ? ?The SARS-CoV-2 RNA is generally detectable in upper respiratory ?specimens during the acute phase of infection. The lowest ?concentration of SARS-CoV-2 viral copies this assay can detect is ?138 copies/mL. A negative result does not preclude SARS-Cov-2 ?infection and should not be used as the sole basis for treatment or ?other patient management decisions. A negative result may occur with  ?improper specimen collection/handling, submission of specimen other ?than nasopharyngeal swab, presence of viral mutation(s) within the ?areas targeted by this assay, and inadequate number of viral ?copies(<138 copies/mL). A negative result must be combined with ?clinical observations, patient history, and epidemiological ?information. The expected result is Negative. ? ?Fact Sheet for Patients:  ?EntrepreneurPulse.com.au ? ?Fact Sheet for Healthcare Providers:  ?IncredibleEmployment.be ? ?This test is no t yet approved or cleared by the [place] FDA and  ?has been authorized for detection and/or diagnosis of SARS-CoV-2 by ?FDA under an Emergency Use Authorization (EUA). This EUA will remain  ?in effect (meaning this test can be used) for the duration of the ?COVID-19 declaration under Section 564(b)(1) of the Act, 21 ?U.S.C.section 360bbb-3(b)(1), unless the authorization is terminated  ?or revoked sooner.  ? ? ?  ? Influenza A by PCR NEGATIVE NEGATIVE Final  ? Influenza B by PCR NEGATIVE NEGATIVE Final  ?  Comment: (NOTE) ?The Xpert Xpress SARS-CoV-2/FLU/RSV plus assay is intended as an aid ?in the diagnosis of influenza from Nasopharyngeal swab specimens and ?should not be used as a sole basis for treatment. Nasal washings and ?aspirates are unacceptable for Xpert Xpress SARS-CoV-2/FLU/RSV ?testing. ? ?Fact Sheet for  Patients: ?EntrepreneurPulse.com.au ? ?Fact Sheet for Healthcare Providers: ?IncredibleEmployment.be ? ?This test is not yet approved or cleared by the [place] FDA and ?has been authorized for detection and/or diagnosis of SARS-CoV-2 by ?FDA under an Emergency Use Authorization (EUA). This EUA will remain ?in effect (meaning this test can be used) for the duration of the ?COVID-19 declaration under Section 564(b)(1) of the Act, 21 U.S.C. ?section 360bbb-3(b)(1), unless the authorization is terminated or ?revoked. ? ?Performed at [facility], [place], ?[place] [address] ?  ? ? ?Coagulation Studies: ?Recent Labs  ?  07/28/21 ?0824  ?LABPROT 14.5  ?INR 1.1  ? ? ? ?Urinalysis: ?No results for input(s): COLORURINE, LABSPEC, [place], GLUCOSEU, HGBUR, BILIRUBINUR, KETONESUR, PROTEINUR, UROBILINOGEN, NITRITE, LEUKOCYTESUR in the last 72 hours. ? ?Invalid input(s): APPERANCEUR  ? ? ?Imaging: ?DG Chest 2 View ? ?Result Date: 07/28/2021 ?CLINICAL DATA:  Shortness of breath EXAM: CHEST - 2 VIEW COMPARISON:  Chest x-rays dated 06/10/2021 and 05/28/2021. FINDINGS: Increased opacity at the RIGHT lung base. Coarse lung markings are again seen throughout both lungs. Scarring/emphysematous change at the  bilateral lung apices. No pneumothorax is seen. LEFT chest wall pacemaker/ICD apparatus in place. RIGHT-sided central catheter appears appropriately positioned with tip at the level of the mid/lower SVC. Heart size and mediastinal contours are stable. IMPRESSION: 1. Increased opacity at the RIGHT lung base. This could represent pneumonia, aspiration, atelectasis or asymmetric pulmonary edema. Favor atelectasis given the elevation of the RIGHT hemidiaphragm suggesting associated volume loss. 2. Chronic interstitial lung disease/fibrosis. 3. Emphysema. Electronically Signed   By: Franki Cabot M.D.   On: 07/28/2021 09:02   ? ? ?Medications:  ? ? sodium chloride Stopped  (07/28/21 0915)  ? sodium chloride    ? sodium chloride    ? sodium chloride    ? ? sodium chloride   Intravenous Once  ? atorvastatin  40 mg Oral q1800  ? calcitRIOL  0.25 mcg Oral Daily  ? carvedilol  6.25 mg Oral BID WC  ? Chlorhexidine Gluconate Cloth  6 each Topical Q0

## 2021-07-29 NOTE — Plan of Care (Signed)
?  Problem: Education: ?Goal: Knowledge of disease or condition will improve ?Outcome: Progressing ?  ?Problem: Elimination: ?Goal: Will not experience complications related to urinary retention ?Outcome: Progressing ?  ?Problem: Skin Integrity: ?Goal: Risk for impaired skin integrity will decrease ?Outcome: Progressing ?  ?

## 2021-07-29 NOTE — Progress Notes (Signed)
PHARMACY CONSULT NOTE ? ?Pharmacy Consult for Electrolyte Monitoring and Replacement  ? ?Recent Labs: ?Potassium (mmol/L)  ?Date Value  ?07/29/2021 3.9  ?12/12/2013 3.2 (L)  ? ?Magnesium (mg/dL)  ?Date Value  ?07/29/2021 1.6 (L)  ?12/10/2013 1.7 (L)  ? ?Calcium (mg/dL)  ?Date Value  ?07/29/2021 8.2 (L)  ? ?Calcium, Total (mg/dL)  ?Date Value  ?12/12/2013 8.1 (L)  ? ?Albumin (g/dL)  ?Date Value  ?07/28/2021 3.1 (L)  ?12/09/2013 3.0 (L)  ? ?Phosphorus (mg/dL)  ?Date Value  ?07/29/2021 4.2  ? ?Sodium (mmol/L)  ?Date Value  ?07/29/2021 136  ?12/12/2013 142  ? ?Corrected Ca: 8.9 mg/dL ? ?Assessment: 81 y.o. male with past medical history of ESRD, CHF, chronic hypoxic respiratory failure, here with shortness of breath secondary to symptomatic anemia and chronic hypoxic respiratory failure. The patient was found to be in new onset atrial fibrillation. Pharmacy is asked to follow and replace electrolytes. ? ?Goal of Therapy:  ?Electrolytes WNL ? ?Plan:  ?1 gram IV magnesium sulfate x 1 ?Recheck electrolytes in am ? ?Dallie Piles ,PharmD ?Clinical Pharmacist ?07/29/2021 12:51 PM ? ?

## 2021-07-29 NOTE — Hospital Course (Addendum)
81 y.o. male with medical history significant for ischemic cardiomyopathy with last known LVEF of 40 to 27%, chronic systolic heart failure, end-stage renal disease on hemodialysis (M/W/F), COPD with chronic respiratory failure on 4 L of oxygen, coronary artery disease, hypertension admitted for worsening shortness of breath, dark stools and anemia.  He received 1 unit pRBC for Hbg 6.7 on admission. ? ?Cardiology consulted for new onset A-fib and elevated troponin, acute on chronic HFrEF. ? ?4/18: 1 unit pRBC transfusion for Hbg 6.9.  GI consult, respiratory status makes sedation too high risk.   ? ?4/19: Hbg 7.6.  O2 requirement back to baseline.  Anxiety improved with low dose Xanax. ? ?4/20: Hbg improved to 8.5.  Respiratory status at baseline.  GI updated.  Has been off Plavix since discharge last month due to worsening anemia. ? ?4/21: EGD with gastric AVM's treated.  O2 desats suring procedure, therefore no colonopscopy at this time. ?

## 2021-07-29 NOTE — H&P (Signed)
?  ?[name] , MD ?[address] [place], [place], [place], [address] ?[address] [place], [place], [place], [address] ?Phone: [phone]  ?Fax: [phone] ? Consultation ? ?Referring Provider:     Dr Manuella Ghazi ?Primary Care Physician:  Tracie Harrier, MD ?Primary Gastroenterologist:  [facility]          ?Reason for Consultation:     anemia  ? ?Date of Admission:  07/28/2021 ?Date of Consultation:  07/29/2021 ?       ? HPI:   ?Peter [name] is a patient with a history of ischemic cardiomyopathy, end-stage renal disease on dialysis chronic respiratory failure on oxygen came into the ER short of breath over last 24 hours. ? ?He denies any hematemesis, node bleeds, abdominal pain , use of nsaids . He does say he has had black stool for weeks. Denies an upper endoscopy but recollects a colonoscopy some years back. ? ? ?On admission hemoglobin 6.8 g 1 month back hemoglobin 8.1 g.  Iron studies showed no clear evidence of iron deficiency.  BNP 3300.  INR 1.1. ?05/27/2021 CT chest bilateral pleural effusions features of bronchitis cholelithiasis. ?The patient has been on Plavix.  Noted to have elevated troponins. ? ? ?Past Medical History:  ?Diagnosis Date  ? Anginal pain ([place])   ? Asthma   ? Bladder infection, acute 03/2011  ? "had a whole lot of bleeding from this"  ? CHF (congestive heart failure) ([place])   ? Coronary artery disease   ? High cholesterol   ? Hypertension   ? 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 "had it in my right; getting shots now in my left"  ? Myocardial infarction [facility]) 1996  ? NSTEMI (non-ST elevated myocardial infarction) ([place]) 12/14/2011  ? Shortness of breath 12/14/2011  ? "@ rest, lying down, w/exertion"  ? ? ?Past Surgical History:  ?Procedure Laterality Date  ? [place]; ~ 2003; ~ 2008  ? "total of 3"  ? CORONARY STENT INTERVENTION N/A 08/10/2016  ? Procedure: Coronary Stent Intervention;  Surgeon: [name], MD;  Location: [place] [facility];   Service: Cardiovascular;  Laterality: N/A;  ? DIALYSIS/PERMA CATHETER INSERTION N/A 06/17/2021  ? Procedure: DIALYSIS/PERMA CATHETER INSERTION;  Surgeon: Katha Cabal, MD;  Location: [facility];  Service: Cardiovascular;  Laterality: N/A;  ? HERNIA REPAIR  ~ 2001  ? "abdominal w/mesh implanted"  ? LEFT HEART CATH AND CORONARY ANGIOGRAPHY N/A 08/10/2016  ? Procedure: Left Heart Cath and Coronary Angiography;  Surgeon: [name], MD;  Location: [place] [facility];  Service: Cardiovascular;  Laterality: N/A;  ? LEFT HEART CATHETERIZATION WITH CORONARY ANGIOGRAM N/A 12/16/2011  ? Procedure: LEFT HEART CATHETERIZATION WITH CORONARY ANGIOGRAM;  Surgeon: Minus Breeding, MD;  Location: [facility] CATH LAB;  Service: Cardiovascular;  Laterality: N/A;  ? PERCUTANEOUS CORONARY STENT INTERVENTION (PCI-S) N/A 12/17/2011  ? Procedure: PERCUTANEOUS CORONARY STENT INTERVENTION (PCI-S);  Surgeon: Sherren Mocha, MD;  Location: [facility] CATH LAB;  Service: Cardiovascular;  Laterality: N/A;  ? TONSILLECTOMY    ? "I was a kid"  ? ? ?Prior to Admission medications   ?Medication Sig Start Date End Date Taking? Authorizing Provider  ?amLODipine (NORVASC) 10 MG tablet Take 1 tablet (10 mg total) by mouth daily. 06/03/21 09/01/21 Yes Enzo Bi, MD  ?aspirin EC 81 MG tablet Take 81 mg by mouth daily. Swallow whole.   Yes [provider]  ?atorvastatin (LIPITOR) 40 MG tablet Take 1 tablet (40 mg  total) by mouth daily at 6 PM. 12/18/11  Yes Annita Brod, MD  ?calcitRIOL (ROCALTROL) 0.25 MCG capsule Take 0.25 mcg by mouth daily.   Yes [provider]  ?carvedilol (COREG) 6.25 MG tablet Take 1 tablet (6.25 mg total) by mouth 2 (two) times daily with a meal. 06/03/21 09/01/21 Yes Enzo Bi, MD  ?cetirizine (ZYRTEC) 10 MG tablet Take 10 mg by mouth.   Yes [provider]  ?citalopram (CELEXA) 20 MG tablet Take 20 mg by mouth daily. 05/22/21  Yes [provider]  ?cyanocobalamin 1000 MCG tablet Take 1,000  mcg by mouth daily.    Yes [provider]  ?finasteride (PROSCAR) 5 MG tablet Take 1 tablet (5 mg total) by mouth daily. 06/03/21 09/01/21 Yes Enzo Bi, MD  ?GLUCOSAMINE-CHONDROITIN-VIT C PO Take 1 tablet by mouth daily.   Yes [provider]  ?hydrALAZINE (APRESOLINE) 100 MG tablet Take 1 tablet (100 mg total) by mouth 3 (three) times daily. 06/03/21 09/01/21 Yes Enzo Bi, MD  ?isosorbide mononitrate (IMDUR) 30 MG 24 hr tablet Take 1 tablet (30 mg total) by mouth daily. 06/03/21 09/01/21 Yes Enzo Bi, MD  ?omeprazole (PRILOSEC) 20 MG capsule Take 20 mg by mouth in the morning.   Yes [provider]  ?tamsulosin (FLOMAX) 0.4 MG CAPS capsule Take 1 capsule (0.4 mg total) by mouth daily. 06/03/21 09/01/21 Yes Enzo Bi, MD  ?umeclidinium bromide (INCRUSE ELLIPTA) 62.5 MCG/ACT AEPB Inhale 1 puff into the lungs daily. 06/03/21 09/01/21 Yes Enzo Bi, MD  ?clobetasol cream (TEMOVATE) 9.37 % Apply 1 application topically 2 (two) times daily as needed (psoriasis).    [provider]  ? ? ?Family History  ?Family history unknown: Yes  ?  ? ?Social History  ? ?Tobacco Use  ? Smoking status: Former  ?  Packs/day: 0.50  ?  Years: 0.50  ?  Pack years: 0.25  ?  Types: Cigarettes  ? Smokeless tobacco: Never  ? Tobacco comments:  ?  11/23/16 Still not ready to quit smoking.  ?Substance Use Topics  ? Alcohol use: Yes  ?  Alcohol/week: 1.0 standard drink  ?  Types: 1 Cans of beer per week  ?  Comment: 12/14/2011 "if my kidneys are sore, I drink 1 beer/day; if not sore; no beer; occasionally drink socially; ave 1 beer/wk maybe"  ? Drug use: No  ? ? ?Allergies as of 07/28/2021 - Review Complete 07/28/2021  ?Allergen Reaction Noted  ? Iodinated contrast media Shortness Of Breath 10/29/2014  ? Iodinated glycerol  [glycerol, iodinated] Shortness Of Breath 02/20/2015  ? ? ?Review of Systems:    ?All systems reviewed and negative except where noted in HPI. ? ? Physical Exam:  ?Vital signs in last 24 hours: ?Temp:   [96.1 ?F (35.6 ?C)-99.2 ?F (37.3 ?C)] 97.5 ?F (36.4 ?C) (04/18 1055) ?Pulse Rate:  [37-94] 69 (04/18 1055) ?Resp:  [9-36] 23 (04/18 1055) ?BP: (123-154)/(59-83) 135/67 (04/18 0314) ?SpO2:  [88 %-100 %] 100 % (04/18 1055) ?FiO2 (%):  [30 %-50 %] 30 % (04/17 1945) ?Weight:  [87.9 kg-88.4 kg] 87.9 kg (04/17 1915) ?Last BM Date : 07/27/21 ?General:   Pleasant, cooperative in NAD ?Head:  Normocephalic and atraumatic. ?Eyes:   No icterus.   Conjunctiva pink. PERRLA. ?Ears:  Normal auditory acuity. ?Neck:  Supple; no masses or thyroidomegaly ?Lungs: decreased air entry b/l   ?Heart:  Regular rate and rhythm;  Without murmur, clicks, rubs or gallops ?Abdomen:  Soft, nondistended, nontender. Normal bowel sounds.  No appreciable masses or hepatomegaly.  No rebound or guarding.  ?Neurologic:  Alert and oriented x3;  grossly normal neurologically. ?Psych:  Alert and cooperative. Normal affect. ? ?LAB RESULTS: ?Recent Labs  ?  07/28/21 ?0824 07/28/21 ?1549 07/28/21 ?2111 07/29/21 ?0439 07/29/21 ?1694  ?WBC 8.2 8.6  --  8.8  --   ?HGB 6.7* 6.8* 7.4* 6.8* 6.9*  ?HCT 21.7* 22.2* 23.5* 21.1* 21.9*  ?PLT 161 151  --  139*  --   ? ?BMET ?Recent Labs  ?  07/28/21 ?0824 07/28/21 ?1549 07/29/21 ?0439  ?NA 138 137 136  ?K 4.0 4.6 3.9  ?CL 103 102 100  ?CO2 '25 25 28  '$ ?GLUCOSE 109* 155* 120*  ?BUN 38* 44* 27*  ?CREATININE 4.93* 5.54* 3.41*  ?CALCIUM 8.5* 8.7* 8.2*  ? ?LFT ?Recent Labs  ?  07/28/21 ?0824 07/28/21 ?1549  ?PROT 5.6*  --   ?ALBUMIN 3.1* 3.1*  ?AST 19  --   ?ALT 18  --   ?ALKPHOS 77  --   ?BILITOT 0.8  --   ? ?PT/INR ?Recent Labs  ?  07/28/21 ?0824  ?LABPROT 14.5  ?INR 1.1  ? ? ?STUDIES: ?DG Chest 2 View ? ?Result Date: 07/28/2021 ?CLINICAL DATA:  Shortness of breath EXAM: CHEST - 2 VIEW COMPARISON:  Chest x-rays dated 06/10/2021 and 05/28/2021. FINDINGS: Increased opacity at the RIGHT lung base. Coarse lung markings are again seen throughout both lungs. Scarring/emphysematous change at the bilateral lung apices. No pneumothorax  is seen. LEFT chest wall pacemaker/ICD apparatus in place. RIGHT-sided central catheter appears appropriately positioned with tip at the level of the mid/lower SVC. Heart size and mediastinal contours ar

## 2021-07-29 NOTE — Progress Notes (Signed)
[facility] ? ? ? ?SUBJECTIVE: 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 The patient was found to be in new onset atrial fibrillation thus cardiology was consulted.  ? ?The patient is feeling significantly better on today per his reports. He continues to have dyspnea, but states that it has improved overnight. He is currently on 2L of oxygen via [place] with oxygen saturations at 100%. The patient endorse intermittent palpitations which are at his baseline and denies having any worsening in the symptoms at this time. He denies having any chest pain, peripheral edema, dizziness or syncope.  ? ? ?Vitals:  ? 07/29/21 0811 07/29/21 0822 07/29/21 1022 07/29/21 1055  ?BP:  132/64  (!) 119/52  ?Pulse: 76 73 61 69  ?Resp: (!) 21 18 (!) 21 (!) 23  ?Temp:  98 ?F (36.7 ?C) 97.8 ?F (36.6 ?C) (!) 97.5 ?F (36.4 ?C)  ?TempSrc:  Oral Oral Oral  ?SpO2: 96% 97% 94% 100%  ?Weight:      ?Height:      ? ? ? ?Intake/Output Summary (Last 24 hours) at 07/29/2021 1207 ?Last data filed at 07/29/2021 0600 ?Gross per 24 hour  ?Intake 480 ml  ?Output 500 ml  ?Net -20 ml  ? ? ? ? ?PHYSICAL EXAM ? ?General: Well developed, well nourished, in no acute distress.  ?HEENT:  Normocephalic and atraumatic. PERRL ?Neck:  No JVD.  ?Lungs: Clear bilaterally to auscultation. Chest expansion symmetrical. Negative for wheezing, rales or rhonchi. Normal effort of breathing without accessory muscle use.  ?Heart: HRRR. Normal S1 and S2 without gallops  ?Abdomen: Bowel sounds are positive, abdomen soft and non-tender  ?Msk:  Normal strength and tone for  age. ?Extremities: RUE edema. Mild, nonpitting BLE edema. Bruising to the BUE. No clubbing or cyanosis.  ?Neuro: Alert and oriented X 3. ?Psych:  Good affect, responds appropriately ? ? ?LABS: ?Basic Metabolic Panel: ?Recent Labs  ?  07/28/21 ?1549 07/29/21 ?0439  ?NA 137 136  ?K 4.6 3.9  ?CL 102 100  ?CO2 25 28  ?GLUCOSE 155* 120*  ?BUN 44* 27*  ?CREATININE 5.54* 3.41*  ?CALCIUM 8.7* 8.2*  ?MG  --  1.6*  ?PHOS 5.2* 4.2  ? ?Liver Function Tests: ?Recent Labs  ?  07/28/21 ?0824 07/28/21 ?1549  ?AST 19  --   ?ALT 18  --   ?ALKPHOS 77  --   ?BILITOT 0.8  --   ?PROT 5.6*  --   ?ALBUMIN 3.1* 3.1*  ? ?No results for input(s): LIPASE, AMYLASE in the last 72 hours. ?CBC: ?Recent Labs  ?  07/28/21 ?0824 07/28/21 ?1549 07/28/21 ?2111 07/29/21 ?0439 07/29/21 ?8242  ?WBC 8.2 8.6  --  8.8  --   ?NEUTROABS 5.6  --   --   --   --   ?HGB 6.7* 6.8*   < > 6.8* 6.9*  ?HCT 21.7* 22.2*   < > 21.1* 21.9*  ?MCV 96.0 95.7  --  92.1  --   ?PLT 161 151  --  139*  --   ? < > = values in this interval not displayed.  ? ?Cardiac Enzymes: ?No results for input(s):  CKTOTAL, CKMB, CKMBINDEX, TROPONINI in the last 72 hours. ?BNP: ?Invalid input(s): POCBNP ?D-Dimer: ?No results for input(s): DDIMER in the last 72 hours. ?Hemoglobin A1C: ?No results for input(s): HGBA1C in the last 72 hours. ?Fasting Lipid Panel: ?No results for input(s): CHOL, HDL, LDLCALC, TRIG, CHOLHDL, LDLDIRECT in the last 72 hours. ?Thyroid Function Tests: ?No results for input(s): TSH, T4TOTAL, T3FREE, THYROIDAB in the last 72 hours. ? ?Invalid input(s): FREET3 ?Anemia Panel: ?Recent Labs  ?  07/28/21 ?0824  ?TIBC 202*  ?IRON 57  ? ? ?DG Chest 2 View ? ?Result Date: 07/28/2021 ?CLINICAL DATA:  Shortness of breath EXAM: CHEST - 2 VIEW COMPARISON:  Chest x-rays dated 06/10/2021 and 05/28/2021. FINDINGS: Increased opacity at the RIGHT lung base. Coarse lung markings are again seen throughout both lungs. Scarring/emphysematous change at the bilateral lung apices. No pneumothorax is  seen. LEFT chest wall pacemaker/ICD apparatus in place. RIGHT-sided central catheter appears appropriately positioned with tip at the level of the mid/lower SVC. Heart size and mediastinal contours are stable. IMPRESSION: 1. Increased opacity at the RIGHT lung base. This could represent pneumonia, aspiration, atelectasis or asymmetric pulmonary edema. Favor atelectasis given the elevation of the RIGHT hemidiaphragm suggesting associated volume loss. 2. Chronic interstitial lung disease/fibrosis. 3. Emphysema. Electronically Signed   By: Franki Cabot M.D.   On: 07/28/2021 09:02   ? ? ?Echocardiogram: (05/29/21) ?IMPRESSIONS  ? 1. Left ventricular ejection fraction, by estimation, is 40 to 45%. The  ?left ventricle has mildly decreased function. The left ventricle  ?demonstrates global hypokinesis. The left ventricular internal cavity size  ?was mildly dilated. Left ventricular  ?diastolic parameters were normal.  ? 2. Right ventricular systolic function is normal. The right ventricular  ?size is normal.  ? 3. Left atrial size was mildly dilated.  ? 4. Right atrial size was mildly dilated.  ? 5. The mitral valve is normal in structure. Mild to moderate mitral valve  ?regurgitation.  ? 6. Tricuspid valve regurgitation is mild to moderate.  ? 7. The aortic valve is normal in structure. Aortic valve regurgitation is  ?not visualized.  ? ?TELEMETRY: SR with LBBB, frequent multifocal PVC's and HR of 64 bpm ? ?ASSESSMENT AND PLAN: ? ?Principal Problem: ?  Acute respiratory failure ([place]) ?Active Problems: ?  Ischemic cardiomyopathy ?  Elevated troponin ?  Symptomatic anemia ?  Atrial fibrillation ([place]) ?  ESRD (end stage renal disease) ([place]) ?  COPD (chronic obstructive pulmonary disease) with emphysema ([place]) ?  Depression ?  ? ?# New onset atrial fibrillation, paroxysmal ?Controlled. Rate controlled. Upon admission on 4/17, the patient's ECG was highly suspicious for atrial fibrillation with a LBBB; the HR was 75 bpm and  there was no identifiable p waves. Beside telemetry on today 4/18 reveals SR with LBBB, frequent PVC's and HR at 64 bpm, no paced beats present at this time. The patient continues to have intermittent palpitations at baseline which are possibly due to the paroxysmal atrial fibrillation versus PVC's. The patient's CHADS2VASc is 5.  ? - anticoagulation contraindicated at this time due to symptomatic anemia.  ? - continue rate control with coreg.  ? - continuous telemetry monitoring until discharge.  ? ?# Elevated troponin  ?# CAD s/p PCI/stent x3 (2018) ?Stable. Likely secondary to demand ischemia in the presence of acute on chronic HFrEF and acute on chronic respiratory failure.  HS Troponin relatively flat at 269>>292>>295 (previously peaked at 3324 during march 2022 admission). The patient is not experiencing any chest pain at  this time and ECG does not show any acute ischemic changes.  ? - HFrEF management as below.  ? - plavix held due to symptomatic anemia.  ? - continue coreg and atorvastatin.  ? ?# HFrEF, acute on chronic  ?Fairly stable.BNP was elevated at 3382.3 upon admission and the patient was treated with IV lasix on 4/17 and underwent dialysis which has helped to improve his fluid status. He appears euvolemic at this time, but continues to have dyspnea likely secondary to a/c respiratory failure. His most recent echocardiogram fromm 05/2021 revealed mildly decreased LV systolic function with global hypokinesis, mild to moderate MR/TR and mildly dilated LV/LA/RA.  ? - continue coreg and imdur.   ? - restart hydralazine '100mg'$  by mouth three times daily as blood pressure allows.  ? - daily weights, strict I&O's, renal diet.  ? ?#HTN ?Chronic. Stable. BP=119/52 map =71. ? - coreg as above.  ? - restart hydralazine '100mg'$  by mouth three times daily as blood pressure allows.  ? ? ?# acute on chronic respiratory failure with hypoxia ?# COPD with emphysema  ?# symptomatic anemia ?Fairly stable at this time.  Patient required bipap upon admission on 4/17, but it was discontinued this morning of 4/18. The patient is now on supplemental oxygen at 2 L via [place] and his current oxygen saturation is 100%. His dyspnea has improve

## 2021-07-29 NOTE — TOC Progression Note (Signed)
Transition of Care (TOC) - Progression Note  ? ? ?Patient Details  ?Name: Peter [name] ?MRN: [id number] ?Date of Birth: 1941/01/16 ? ?Transition of Care (TOC) CM/SW Contact  ?Candie Chroman, LCSW ?Phone Number: ?07/29/2021, 11:58 AM ? ?Clinical Narrative:   Patient is active with [facility] for PT, OT, RN. ? ?Expected Discharge Plan: Assisted Living ?Barriers to Discharge: Continued Medical Work up ? ?Expected Discharge Plan and Services ?Expected Discharge Plan: Assisted Living ?  ?Discharge Planning Services: CM Consult ?  ?Living arrangements for the past 2 months: [place] ?                ?  ?  ?  ?  ?  ?  ?  ?  ?  ?  ? ? ?Social Determinants of Health (SDOH) Interventions ?  ? ?Readmission Risk Interventions ? ?  06/11/2021  ? 12:25 PM 04/28/2021  ? 12:10 PM  ?Readmission Risk Prevention Plan  ?Transportation Screening Complete Complete  ?PCP or Specialist Appt within 3-5 Days  Complete  ?[place] or Home Care Consult  Complete  ?Social Work Consult for [place] Planning/Counseling  Complete  ?Palliative Care Screening  Not Applicable  ?Medication Review Press photographer) Complete Complete  ?PCP or Specialist appointment within 3-5 days of discharge Complete   ?[place] or Home Care Consult Complete   ?SW Recovery Care/Counseling Consult Complete   ?Palliative Care Screening Not Applicable   ?[place] Not Applicable   ? ? ?

## 2021-07-29 NOTE — Progress Notes (Addendum)
Vital signs reviewed, ICU needs resolved ? ?Will sign off at this time. No further recommendations at this time. ? ?Please contact us with any further questions ? ?Corrin Parker, M.D.  ?[facility]  ?Medical Director [place] ?Medical Director [facility] Cardio-Pulmonary Department  ? ?

## 2021-07-30 DIAGNOSIS — J9621 Acute and chronic respiratory failure with hypoxia: Secondary | ICD-10-CM | POA: Diagnosis not present

## 2021-07-30 LAB — TYPE AND SCREEN
ABO/RH(D): O POS
Antibody Screen: NEGATIVE
Unit division: 0
Unit division: 0

## 2021-07-30 LAB — CBC
HCT: 24 % — ABNORMAL LOW (ref 39.0–52.0)
Hemoglobin: 7.6 g/dL — ABNORMAL LOW (ref 13.0–17.0)
MCH: 29.6 pg (ref 26.0–34.0)
MCHC: 31.7 g/dL (ref 30.0–36.0)
MCV: 93.4 fL (ref 80.0–100.0)
Platelets: 131 10*3/uL — ABNORMAL LOW (ref 150–400)
RBC: 2.57 MIL/uL — ABNORMAL LOW (ref 4.22–5.81)
RDW: 16.4 % — ABNORMAL HIGH (ref 11.5–15.5)
WBC: 9.5 10*3/uL (ref 4.0–10.5)
nRBC: 0 % (ref 0.0–0.2)

## 2021-07-30 LAB — BPAM RBC
Blood Product Expiration Date: 202305082359
Blood Product Expiration Date: 202305112359
ISSUE DATE / TIME: 202304171549
ISSUE DATE / TIME: 202304181028
Unit Type and Rh: 5100
Unit Type and Rh: 5100

## 2021-07-30 LAB — BASIC METABOLIC PANEL
Anion gap: 9 (ref 5–15)
BUN: 44 mg/dL — ABNORMAL HIGH (ref 8–23)
CO2: 27 mmol/L (ref 22–32)
Calcium: 8.2 mg/dL — ABNORMAL LOW (ref 8.9–10.3)
Chloride: 99 mmol/L (ref 98–111)
Creatinine, Ser: 4.69 mg/dL — ABNORMAL HIGH (ref 0.61–1.24)
GFR, Estimated: 12 mL/min — ABNORMAL LOW (ref >60)
Glucose, Bld: 104 mg/dL — ABNORMAL HIGH (ref 70–99)
Potassium: 3.9 mmol/L (ref 3.5–5.1)
Sodium: 135 mmol/L (ref 135–145)

## 2021-07-30 LAB — MAGNESIUM: Magnesium: 1.8 mg/dL (ref 1.7–2.4)

## 2021-07-30 LAB — HEMOGLOBIN AND HEMATOCRIT, BLOOD
HCT: 24.5 % — ABNORMAL LOW (ref 39.0–52.0)
Hemoglobin: 7.9 g/dL — ABNORMAL LOW (ref 13.0–17.0)

## 2021-07-30 LAB — OCCULT BLOOD X 1 CARD TO LAB, STOOL: Fecal Occult Bld: POSITIVE — AB

## 2021-07-30 LAB — MRSA NEXT GEN BY PCR, NASAL: MRSA by PCR Next Gen: NOT DETECTED

## 2021-07-30 MED ORDER — HYDRALAZINE HCL 50 MG PO TABS
100.0000 mg | ORAL_TABLET | Freq: Three times a day (TID) | ORAL | Status: DC
  Administered 2021-07-30 – 2021-08-01 (×4): 100 mg via ORAL
  Filled 2021-07-30 (×5): qty 2

## 2021-07-30 MED ORDER — HEPARIN SODIUM (PORCINE) 1000 UNIT/ML IJ SOLN
INTRAMUSCULAR | Status: AC
  Administered 2021-07-30: 3800 [IU] via INTRAVENOUS_CENTRAL
  Filled 2021-07-30: qty 10

## 2021-07-30 MED ORDER — POTASSIUM CHLORIDE 20 MEQ PO PACK: 20.0000 meq | PACK | Freq: Once | ORAL | Status: DC

## 2021-07-30 MED ORDER — MAGNESIUM SULFATE 2 GM/50ML IV SOLN
2.0000 g | Freq: Once | INTRAVENOUS | Status: DC
  Filled 2021-07-30: qty 50

## 2021-07-30 MED ORDER — EPOETIN ALFA 10000 UNIT/ML IJ SOLN
INTRAMUSCULAR | Status: AC
  Filled 2021-07-30: qty 1

## 2021-07-30 NOTE — Progress Notes (Signed)
Hemodialysis Post Treatment Note: ? ?Tx date:07/30/2021 ?Tx time: 3 hours ?Access:right CVC ?UF Removed: 554m ? ?Note: ?HD completed, tolerated well. No complications. Patient asymptomatic ? ? ? ? ? ? ? ? ?  ?

## 2021-07-30 NOTE — Progress Notes (Addendum)
PHARMACY CONSULT NOTE ? ?Pharmacy Consult for Electrolyte Monitoring and Replacement  ? ?Recent Labs: ?Potassium (mmol/L)  ?Date Value  ?07/30/2021 3.9  ?12/12/2013 3.2 (L)  ? ?Magnesium (mg/dL)  ?Date Value  ?07/30/2021 1.8  ?12/10/2013 1.7 (L)  ? ?Calcium (mg/dL)  ?Date Value  ?07/30/2021 8.2 (L)  ? ?Calcium, Total (mg/dL)  ?Date Value  ?12/12/2013 8.1 (L)  ? ?Albumin (g/dL)  ?Date Value  ?07/28/2021 3.1 (L)  ?12/09/2013 3.0 (L)  ? ?Phosphorus (mg/dL)  ?Date Value  ?07/29/2021 4.2  ? ?Sodium (mmol/L)  ?Date Value  ?07/30/2021 135  ?12/12/2013 142  ? ?Corrected Ca: 8.92 mg/dL ? ?Assessment: 81 y.o. male with past medical history of ESRD, CHF, chronic hypoxic respiratory failure, here with shortness of breath secondary to symptomatic anemia and chronic hypoxic respiratory failure. The patient was found to be in new onset atrial fibrillation. Pharmacy is asked to follow and replace electrolytes. ? ?Goal of Therapy:  ?Electrolytes WNL ?K 4-5 ?Mag > 2 ? ?Plan:  ?Originally planned to give Mag 2 grams and Kcl 20 meq x 1. However, nephrology deferring replacement today 4/19, as patient will be undergoing HD.  ?Recheck electrolytes in am ? ? ?Wynelle Cleveland, PharmD ?Pharmacy Resident  ?07/30/2021 ?8:34 AM ? ?

## 2021-07-30 NOTE — Progress Notes (Signed)
?[facility]  ?ROUNDING NOTE  ? ?Subjective:  ? ?Peter [name] is a 81 year old male with past medical conditions including chronic systolic heart failure with EF 40 to 45%, chronic respiratory failure on 4 L nasal cannula, COPD, hypertension, CAD, and end-stage renal disease on hemodialysis.  Patient presents to the emergency department with complaints of shortness of breath progressively worse since yesterday.  Patient has been admitted for Acute respiratory failure ([place]) [J96.00] ?ESRD (end stage renal disease) ([place]) [N18.6] ?Symptomatic anemia [D64.9] ?Acute on chronic respiratory failure with hypoxemia ([place]) [J96.21] ? ?Patient is known to our practice and receives outpatient dialysis at [facility] on a MWF schedule, supervised by Dr. Holley Raring.  Patient has recently began dialysis treatments as of last month.  Last dialysis treatment received on Friday, received full treatment.  Also complains of increased fatigue.  Reports dark stools.  Denies chest or abdominal pain.  Denies use of NSAIDs.  Patient uses baseline 4 L oxygen, EMS for satting 80% on arrival.  Was given nebulizer and Solu-Medrol prior to transport to ED.  Patient seen in ED with family at bedside.  Appears very anxious stating he cannot breathe he is not getting enough oxygen.  Maintaining sats in 4 to 96% 4 L.  Denies nausea and vomiting. ? ?Scheduled for HD today ?Hgb is 7.6 ?K 3.9 ?Sitting up in the chair eating his breakfast ?Feels better compared to yesterday ? ? ? ? ?Objective:  ?Vital signs in last 24 hours:  ?Temp:  [97.3 ?F (36.3 ?C)-97.9 ?F (36.6 ?C)] 97.3 ?F (36.3 ?C) (04/19 0800) ?Pulse Rate:  [61-75] 75 (04/19 0800) ?Resp:  [14-24] 23 (04/19 0800) ?BP: (109-145)/(49-79) 145/57 (04/19 0800) ?SpO2:  [93 %-100 %] 93 % (04/19 0800) ? ?Weight change:  ?Filed Weights  ? 07/28/21 1100 07/28/21 1500 07/28/21 1915  ?Weight: 90.5 kg 88.4 kg 87.9 kg  ? ? ?Intake/Output: ?I/O last 3 completed shifts: ?In: 1300 [P.O.:960;  I.V.:20; Blood:320] ?Out: 500 [Urine:500] ?  ?Intake/Output this shift: ? No intake/output data recorded. ? ?Physical Exam: ?General: NAD, anxious, restless  ?Head: Normocephalic, atraumatic. Moist oral mucosal membranes  ?Eyes: Anicteric  ?Lungs:  Normal effort, clear to auscultation, [place] O2  ?Heart: Regular rate and rhythm  ?Abdomen:  Soft, nontender, nondistended  ?Extremities: Trace peripheral edema.  ?Neurologic: Nonfocal, moving all four extremities  ?Skin: No lesions  ?Access: Right PermCath  ? ? ?Basic Metabolic Panel: ?Recent Labs  ?Lab 07/28/21 ?0824 07/28/21 ?1549 07/29/21 ?0439 07/30/21 ?0316  ?NA 138 137 136 135  ?K 4.0 4.6 3.9 3.9  ?CL 103 102 100 99  ?CO2 '25 25 28 27  '$ ?GLUCOSE 109* 155* 120* 104*  ?BUN 38* 44* 27* 44*  ?CREATININE 4.93* 5.54* 3.41* 4.69*  ?CALCIUM 8.5* 8.7* 8.2* 8.2*  ?MG  --   --  1.6* 1.8  ?PHOS  --  5.2* 4.2  --   ? ? ? ?Liver Function Tests: ?Recent Labs  ?Lab 07/28/21 ?0824 07/28/21 ?1549  ?AST 19  --   ?ALT 18  --   ?ALKPHOS 77  --   ?BILITOT 0.8  --   ?PROT 5.6*  --   ?ALBUMIN 3.1* 3.1*  ? ? ?No results for input(s): LIPASE, AMYLASE in the last 168 hours. ?No results for input(s): AMMONIA in the last 168 hours. ? ?CBC: ?Recent Labs  ?Lab 07/28/21 ?0824 07/28/21 ?1549 07/28/21 ?2111 07/29/21 ?0439 07/29/21 ?0909 07/29/21 ?1513 07/30/21 ?0316  ?WBC 8.2 8.6  --  8.8  --   --  9.5  ?NEUTROABS 5.6  --   --   --   --   --   --   ?HGB 6.7* 6.8* 7.4* 6.8* 6.9* 7.5* 7.6*  ?HCT 21.7* 22.2* 23.5* 21.1* 21.9* 23.2* 24.0*  ?MCV 96.0 95.7  --  92.1  --   --  93.4  ?PLT 161 151  --  139*  --   --  131*  ? ? ? ?Cardiac Enzymes: ?No results for input(s): CKTOTAL, CKMB, CKMBINDEX, TROPONINI in the last 168 hours. ? ?BNP: ?Invalid input(s): POCBNP ? ?CBG: ?Recent Labs  ?Lab 07/28/21 ?1116  ?GLUCAP 174*  ? ? ? ?Microbiology: ?Results for orders placed or performed during the hospital encounter of 07/28/21  ?Resp Panel by RT-PCR (Flu A&B, Covid) Nasopharyngeal Swab     Status: None  ? Collection  Time: 07/28/21  8:24 AM  ? Specimen: Nasopharyngeal Swab; Nasopharyngeal(NP) swabs in vial transport medium  ?Result Value Ref Range Status  ? SARS Coronavirus 2 by RT PCR NEGATIVE NEGATIVE Final  ?  Comment: (NOTE) ?SARS-CoV-2 target nucleic acids are NOT DETECTED. ? ?The SARS-CoV-2 RNA is generally detectable in upper respiratory ?specimens during the acute phase of infection. The lowest ?concentration of SARS-CoV-2 viral copies this assay can detect is ?138 copies/mL. A negative result does not preclude SARS-Cov-2 ?infection and should not be used as the sole basis for treatment or ?other patient management decisions. A negative result may occur with  ?improper specimen collection/handling, submission of specimen other ?than nasopharyngeal swab, presence of viral mutation(s) within the ?areas targeted by this assay, and inadequate number of viral ?copies(<138 copies/mL). A negative result must be combined with ?clinical observations, patient history, and epidemiological ?information. The expected result is Negative. ? ?Fact Sheet for Patients:  ?EntrepreneurPulse.com.au ? ?Fact Sheet for Healthcare Providers:  ?IncredibleEmployment.be ? ?This test is no t yet approved or cleared by the [place] FDA and  ?has been authorized for detection and/or diagnosis of SARS-CoV-2 by ?FDA under an Emergency Use Authorization (EUA). This EUA will remain  ?in effect (meaning this test can be used) for the duration of the ?COVID-19 declaration under Section 564(b)(1) of the Act, 21 ?U.S.C.section 360bbb-3(b)(1), unless the authorization is terminated  ?or revoked sooner.  ? ? ?  ? Influenza A by PCR NEGATIVE NEGATIVE Final  ? Influenza B by PCR NEGATIVE NEGATIVE Final  ?  Comment: (NOTE) ?The Xpert Xpress SARS-CoV-2/FLU/RSV plus assay is intended as an aid ?in the diagnosis of influenza from Nasopharyngeal swab specimens and ?should not be used as a sole basis for treatment. Nasal washings  and ?aspirates are unacceptable for Xpert Xpress SARS-CoV-2/FLU/RSV ?testing. ? ?Fact Sheet for Patients: ?EntrepreneurPulse.com.au ? ?Fact Sheet for Healthcare Providers: ?IncredibleEmployment.be ? ?This test is not yet approved or cleared by the [place] FDA and ?has been authorized for detection and/or diagnosis of SARS-CoV-2 by ?FDA under an Emergency Use Authorization (EUA). This EUA will remain ?in effect (meaning this test can be used) for the duration of the ?COVID-19 declaration under Section 564(b)(1) of the Act, 21 U.S.C. ?section 360bbb-3(b)(1), unless the authorization is terminated or ?revoked. ? ?Performed at [facility], [place], ?[place] [address] ?  ? ? ?Coagulation Studies: ?Recent Labs  ?  07/28/21 ?0824  ?LABPROT 14.5  ?INR 1.1  ? ? ? ?Urinalysis: ?No results for input(s): COLORURINE, LABSPEC, [place], GLUCOSEU, HGBUR, BILIRUBINUR, KETONESUR, PROTEINUR, UROBILINOGEN, NITRITE, LEUKOCYTESUR in the last 72 hours. ? ?Invalid input(s): APPERANCEUR  ? ? ?Imaging: ?No  results found. ? ? ?Medications:  ? ? sodium chloride Stopped (07/28/21 0915)  ? sodium chloride    ? sodium chloride    ? sodium chloride    ? magnesium sulfate bolus IVPB    ? ? atorvastatin  40 mg Oral q1800  ? calcitRIOL  0.25 mcg Oral Daily  ? carvedilol  6.25 mg Oral BID WC  ? Chlorhexidine Gluconate Cloth  6 each Topical Q0600  ? citalopram  20 mg Oral Daily  ? epoetin (EPOGEN/PROCRIT) injection  8,000 Units Intravenous Q M,W,F-HD  ? finasteride  5 mg Oral Daily  ?  HYDROmorphone (DILAUDID) injection  0.5 mg Intravenous Once  ? isosorbide mononitrate  30 mg Oral Daily  ? loratadine  10 mg Oral Daily  ? mouth rinse  15 mL Mouth Rinse BID  ? mometasone-formoterol  2 puff Inhalation BID  ? pantoprazole (PROTONIX) IV  40 mg Intravenous Q24H  ? potassium chloride  20 mEq Oral Once  ? sodium chloride flush  3 mL Intravenous Q12H  ? tamsulosin  0.4 mg Oral Daily  ?  umeclidinium bromide  1 puff Inhalation Daily  ? cyanocobalamin  1,000 mcg Oral Daily  ? ? ? ?Assessment/ Plan:  ?[name] is a 81 y.o.  male with past medical conditions including chronic systolic he

## 2021-07-30 NOTE — Progress Notes (Signed)
[facility] Cardiology ? ? ? ?SUBJECTIVE: 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 The patient was found to be in new onset atrial fibrillation thus cardiology was consulted.  ? ? ?The patient is not feeling well on today due to the dyspnea and states " I feel like I have to think hard about breathing;" furthermore, he requests that his supplemental oxygen is increased at this time. He denies having any chest pain, peripheral edema or dizziness; however, he has not been out of bed since being admitted.  ? ? ?(07/29/21): The patient is feeling significantly better on today per his reports. He continues to have dyspnea, but states that it has improved overnight. He is currently on 2L of oxygen via [place] with oxygen saturations at 100%. The patient endorse intermittent palpitations which are at his baseline and denies having any worsening in the symptoms at this time. He denies having any chest pain, peripheral edema, dizziness or syncope.  ? ? ?Vitals:  ? 07/30/21 0000 07/30/21 0400 07/30/21 0422 07/30/21 0800  ?BP: (!) 116/56 124/79  (!) 145/57  ?Pulse: 61 73  75  ?Resp: (!) 23 17  (!) 23  ?Temp:   97.8 ?F (36.6 ?C) (!) 97.3 ?F (36.3 ?C)  ?TempSrc:   Oral Oral  ?SpO2: 96% 98%  93%  ?Weight:      ?Height:      ? ? ? ?Intake/Output Summary (Last 24 hours) at 07/30/2021 1128 ?Last data filed at 07/30/2021 1000 ?Gross per 24 hour  ?Intake 1414 ml  ?Output 500 ml  ?Net 914 ml  ? ? ? ? ?PHYSICAL EXAM ? ?General: Well developed, well nourished, in no acute distress.  ?HEENT:  Normocephalic and atraumatic. PERRL ?Neck:  No JVD.   ?Lungs: Clear bilaterally to auscultation. Chest expansion symmetrical. Negative for wheezing, rales or rhonchi. Normal effort of breathing without accessory muscle use.  ?Heart: HRRR. Normal S1 and S2 without gallops  ?Abdomen: Bowel sounds are positive, abdomen soft and non-tender  ?Msk:  Normal strength and tone for age. ?Extremities: Mild, nonpitting BLE edema. Bruising to the BUE. No clubbing or cyanosis.  ?Neuro: Alert and oriented X 3. ?Psych:  Good affect, responds appropriately ? ? ?LABS: ?Basic Metabolic Panel: ?Recent Labs  ?  07/28/21 ?1549 07/29/21 ?0439 07/30/21 ?0316  ?NA 137 136 135  ?K 4.6 3.9 3.9  ?CL 102 100 99  ?CO2 '25 28 27  '$ ?GLUCOSE 155* 120* 104*  ?BUN 44* 27* 44*  ?CREATININE 5.54* 3.41* 4.69*  ?CALCIUM 8.7* 8.2* 8.2*  ?MG  --  1.6* 1.8  ?PHOS 5.2* 4.2  --   ? ?Liver Function Tests: ?Recent Labs  ?  07/28/21 ?0824 07/28/21 ?1549  ?AST 19  --   ?ALT 18  --   ?ALKPHOS 77  --   ?BILITOT 0.8  --   ?PROT 5.6*  --   ?ALBUMIN 3.1* 3.1*  ? ?No results for input(s): LIPASE, AMYLASE in the last 72 hours. ?CBC: ?Recent Labs  ?  07/28/21 ?0824 07/28/21 ?1549 07/29/21 ?0439 07/29/21 ?0909 07/29/21 ?1513 07/30/21 ?0316  ?WBC 8.2   < > 8.8  --   --  9.5  ?NEUTROABS 5.6  --   --   --   --   --   ?HGB 6.7*   < > 6.8*   < > 7.5* 7.6*  ?HCT 21.7*   < > 21.1*   < > 23.2* 24.0*  ?MCV 96.0   < > 92.1  --   --  93.4  ?PLT 161   < > 139*  --   --  131*  ? < > = values in this interval not displayed.  ? ?Cardiac Enzymes: ?No results for input(s): CKTOTAL, CKMB, CKMBINDEX, TROPONINI in the last 72 hours. ?BNP: ?Invalid input(s): POCBNP ?D-Dimer: ?No results for input(s): DDIMER in the last 72 hours. ?Hemoglobin A1C: ?No results for input(s): HGBA1C in the last 72 hours. ?Fasting Lipid Panel: ?No results for input(s): CHOL, HDL, LDLCALC, TRIG, CHOLHDL, LDLDIRECT in the last 72 hours. ?Thyroid Function Tests: ?No results for input(s): TSH, T4TOTAL, T3FREE, THYROIDAB in the last 72 hours. ? ?Invalid input(s):  FREET3 ?Anemia Panel: ?Recent Labs  ?  07/28/21 ?0824  ?TIBC 202*  ?IRON 57  ? ? ?No results found. ? ? ?Echocardiogram: (05/29/21) ?IMPRESSIONS  ? 1. Left ventricular ejection fraction, by estimation, is 40 to 45%. The  ?left ventricle has mildly decreased function. The left ventricle  ?demonstrates global hypokinesis. The left ventricular internal cavity size  ?was mildly dilated. Left ventricular  ?diastolic parameters were normal.  ? 2. Right ventricular systolic function is normal. The right ventricular  ?size is normal.  ? 3. Left atrial size was mildly dilated.  ? 4. Right atrial size was mildly dilated.  ? 5. The mitral valve is normal in structure. Mild to moderate mitral valve  ?regurgitation.  ? 6. Tricuspid valve regurgitation is mild to moderate.  ? 7. The aortic valve is normal in structure. Aortic valve regurgitation is  ?not visualized.  ? ?TELEMETRY: SR with LBBB, frequent multifocal PVC's and HR of 74 bpm ? ?ASSESSMENT AND PLAN: ? ?Principal Problem: ?  Acute respiratory failure ([place]) ?Active Problems: ?  Ischemic cardiomyopathy ?  Elevated troponin ?  Symptomatic anemia ?  Atrial fibrillation ([place]) ?  ESRD (end stage renal disease) ([place]) ?  COPD (chronic obstructive pulmonary disease) with emphysema ([place]) ?  Depression ?  ? ?# New onset atrial fibrillation, paroxysmal ?Controlled. Rate controlled. Upon admission on 4/17, the patient's ECG was highly suspicious for atrial fibrillation with a LBBB; the HR was 75 bpm and there was no identifiable p waves. Beside telemetry on 4/18 reveals SR with LBBB, frequent PVC's and HR at 64 bpm, no paced beats present. Telemetry on today 4/19 continues to revealed SR with LBBB, frequent PVC's and a HR of 74 bpm without any paced beats. The patient continues to have intermittent palpitations at baseline which are possibly due to the paroxysmal atrial fibrillation versus PVC's. The patient's CHADS2VASc is 5.  ? - anticoagulation contraindicated at this time due to  symptomatic anemia.  ? - continue rate control with coreg.  ? - continuous telemetry monitoring until discharge.  ? ?# Elevated troponin  ?# CAD s/p PCI/stent x3 (2018) ?Stable. Likely secondary to demand ischemia in the presence of acute on chronic HFrEF and acute on chronic respiratory failure.  HS Troponin relatively flat at 269>>292>>295 (previously peaked at 3324 during march 2022 admission). The patient is not experiencing any chest pain at this time and ECG does not show any acute ischemic changes.  ? - HFrEF management as below.  ? - plavix  held due to symptomatic anemia.  ? - continue coreg and atorvastatin.  ? ?# HFrEF, acute on chronic  ?Fairly stable.BNP was elevated at 3382.3 upon admission and the patient was treated with IV lasix on 4/17 and underwent dialysis which has helped to improve his fluid status. He appears euvolemic at this time, but continues to have dyspnea likely secondary to a/c respiratory failure. His most recent echocardiogram fromm 05/2021 revealed mildly decreased LV systolic function with global hypokinesis, mild to moderate MR/TR and mildly dilated LV/LA/RA.  ? - continue coreg and imdur.   ? - restart hydralazine '100mg'$  by mouth three times daily as blood pressure allows.  ? - daily weights, strict I&O's, renal diet.  ? ?#HTN ?Chronic. Stable. BP=145/57 map =83. ? - coreg as above.  ? - restart hydralazine '100mg'$  by mouth three times daily as blood pressure allows.  ? ? ?# acute on chronic respiratory failure with hypoxia ?# COPD with emphysema  ?# symptomatic anemia ?Fairly stable at this time. Patient required bipap upon admission on 4/17, but it was discontinued this morning of 4/18. The patient is now on supplemental oxygen at 3 L via [place] and his current oxygen saturation is 96%. His dyspnea has worsened overnight and would like to increase his O2 via [place] at this time. His inital hgb upon admission was 6.7. Received 2 units of PRBC since admission and H/H on today is 7.6/24.  ? -  agreement with current management plan with blood transfusion and supplemental oxygen.  ? - trend H/H. ? - continue protonix. ? - GI consult appreciated.  ? ?# ESRD on dialysis (MWF) ?Stable. Management per [facility]ph[facility]

## 2021-07-30 NOTE — Assessment & Plan Note (Signed)
New onset A-fib, paroxysmal.  ?CHA2DS2-VASc score of 5 and ideally will require long-term anticoagulation as primary prophylaxis for an acute stroke, currently with acute anemia from blood loss. ?--Continue Coreg for rate control ?--Anticoagulation currently contraindicated due to acute blood loss ?--Cardiology input appreciated ?

## 2021-07-30 NOTE — Assessment & Plan Note (Signed)
Due to demand ischemia from anemia ?Unable to administer any antiplatelets for now due to symptomatic anemia ?

## 2021-07-30 NOTE — Assessment & Plan Note (Addendum)
Continue scheduled and as needed bronchodilator therapy.  Continue inhaled steroids. ? ?4/19-21: stable on 2 L oxygen via nasal cannula.  ?

## 2021-07-30 NOTE — Progress Notes (Signed)
Dr. Candiss Norse notified of order for potassium and mag.  Doesn't want them to be given.  Will correct in dialysis. ?

## 2021-07-30 NOTE — Assessment & Plan Note (Signed)
Patient has an underlying history of COPD with chronic respiratory failure and is on 3-4 L of oxygen continuous at home. ?He presents for evaluation of worsening shortness of breath from his baseline associated with tachypnea and accessory muscle use ?Initially required BiPAP in the stepdown unit. ?Weaned to 2 L oxygen by nasal cannula. ?--Supplement O2 to keep sats 90-95%  ?--Bipap as needed ?--Close pulmonology follow up ?

## 2021-07-30 NOTE — Assessment & Plan Note (Signed)
Dialysis days are M/W/F ?Dialysis per nephro while in hospital ?

## 2021-07-30 NOTE — Plan of Care (Signed)
Patient's oxygen requirements are back to baseline.  Anxiety is a problem xanax given.  Tolerated dialysis today off unit.   ?

## 2021-07-30 NOTE — Progress Notes (Signed)
?Progress Note ? ? ?Patient: Peter [name]:[id number] DOB: Jan 24, 1941 DOA: 07/28/2021     2 ?DOS: the patient was seen and examined on 07/30/2021 ?  ?Brief hospital course: ? 81 y.o. male with medical history significant for ischemic cardiomyopathy with last known LVEF of 40 to 01%, chronic systolic heart failure, end-stage renal disease on hemodialysis (M/W/F), COPD with chronic respiratory failure on 4 L of oxygen, coronary artery disease, hypertension admitted for worsening shortness of breath, dark stools and anemia.  He received 1 unit pRBC for Hbg 6.7 on admission. ? ?Cardiology consulted for new onset A-fib and elevated troponin, acute on chronic HFrEF. ? ?4/18: 1 unit pRBC transfusion for Hbg 6.9.  GI consult, respiratory status makes sedation too high risk.   ? ?4/19: Hbg 7.6.  O2 requirement back to baseline.  Anxiety improved with low dose Xanax. ? ?Assessment and Plan: ?* Acute on chronic respiratory failure with hypoxia (HCC) ?Patient has an underlying history of COPD with chronic respiratory failure and is on 3-4 L of oxygen continuous at home. ?He presents for evaluation of worsening shortness of breath from his baseline associated with tachypnea and accessory muscle use ?Initially required BiPAP in the stepdown unit. ?Weaned to 2 L oxygen by nasal cannula. ?--Supplement O2 to keep sats 90-95%  ?--Bipap as needed ?--Close pulmonology follow up ? ?Symptomatic anemia ?Presents to the ER for evaluation of worsening shortness of breath from his baseline and noted to have a hemoglobin of 6.7 ?His baseline hemoglobin is around 8 ?Patient states that he has had melena stools and chart review shows he had a recent upper endoscopy done in February which revealed gastritis.  ?--Plavix has been on hold ?--Continue IV Protonix.   ?--GI consulted, see their recommendations ? ?Hemoglobin 7.6 this morning ?--Trend Hbg, transfuse if < 7 or actively bleeding ? ?Atrial fibrillation ([place]) ?New onset A-fib,  paroxysmal.  ?CHA2DS2-VASc score of 5 and ideally will require long-term anticoagulation as primary prophylaxis for an acute stroke, currently with acute anemia from blood loss. ?--Continue Coreg for rate control ?--Anticoagulation currently contraindicated due to acute blood loss ?--Cardiology input appreciated ? ?Ischemic cardiomyopathy ?Patient has a known history of ischemic cardiomyopathy with last known LVEF of 40 to 45%, 2D echocardiogram which was done 10/22. ?-- Continue carvedilol and nitrates ?-- Hold Plavix for now due to symptomatic anemia ? ?ESRD (end stage renal disease) ([place]) ?Dialysis days are M/W/F ?Dialysis per nephro while in hospital ? ?Depression ?Continue Celexa ? ?COPD (chronic obstructive pulmonary disease) with emphysema ([place]) ?Continue scheduled and as needed bronchodilator therapy ?Continue inhaled steroids ?On 2 L oxygen via nasal cannula now.  ? ?Chronic respiratory failure with hypoxia ([place]) ?Baseline O2 need is 3-4 L/min ? ?Elevated troponin ?Due to demand ischemia from anemia ?Unable to administer any antiplatelets for now due to symptomatic anemia ? ?Acute on chronic systolic CHF (congestive heart failure) ([place]) ?Stable.  Initially contributed to acute on chronic respiratory failure.  Given IV Lasix on 4/17 and underwent hemodialysis.  Volume status improved.   ? ?Recent Echo on 05/2021 -- mildly decreased LV systolic function with global hypokinesis, mild to moderate MR/TR and mildly dilated LV/LA/RA.  ? ?--Cardiology following ?--On Coreg, Imdur ?--Resume hydralazine ?--Daily weights, strict I/O's ?--Renal diet, fluid restriction ? ? ? ? ?  ? ?Subjective: Pt seen in ICU with two family members present.  Up in chair, pt reports breathing better sitting up, and since anxiety medication given.  He feels Bipap really helps his breathing  and asks about getting this at home. Suggests his pulmonologist may be working on this.  Also asks when GI can do scopes to evaluate the bleeding.  No  other acute complaints.  ? ?RN reported significant anxiety with associated SOB, improved significantly with low dose Xanax.  ? ? ?Physical Exam: ?Vitals:  ? 07/30/21 1615 07/30/21 1619 07/30/21 1629 07/30/21 1700  ?BP: 129/64  130/63 103/85  ?Pulse: 74  64 68  ?Resp: (!) 21  20 (!) 21  ?Temp: (!) 97.5 ?F (36.4 ?C)   98.1 ?F (36.7 ?C)  ?TempSrc: Oral   Oral  ?SpO2: 99%  99% 97%  ?Weight:  88.3 kg    ?Height:      ? ?General exam: awake, alert, no acute distress, chronically ill appearing ?HEENT: atraumatic, clear conjunctiva, anicteric sclera, moist mucus membranes, hearing grossly normal  ?Respiratory system: CTAB, no wheezes, rales or rhonchi, mildly increased respiratory effort, mild conversational dyspnea, on 2 l/min O2 [place]. ?Cardiovascular system: normal S1/S2,  RRR, no JVD, murmurs, rubs, gallops, no pedal edema.   ?Gastrointestinal system: soft, NT, ND, no HSM felt, +bowel sounds. ?Central nervous system: A&O x3. no gross focal neurologic deficits, normal speech ?Extremities: moves all, no edema, normal tone ?Skin: dry, intact, normal temperature ?Psychiatry: normal mood, congruent affect, judgement and insight appear normal ? ?Data Reviewed: ? ?Notable labs: glucose 104, BUN 44, Cr 4.69, Ca 8.2, Hbg 7.6>>7.9, Plt 131k ? ?Family Communication: two family members at bedside on rounds  ? ?Disposition: ?Status is: Inpatient ?Remains inpatient appropriate because: ongoing anemia requiring close monitoring, GI evaluation pending ? ? Planned Discharge Destination: Home ? ? ? ?Time spent: 45 minutes ? ?Author: ?Ezekiel Slocumb, DO ?07/30/2021 6:27 PM ? ?For on call review [email or web address].  ?

## 2021-07-30 NOTE — Assessment & Plan Note (Addendum)
Patient has a known history of ischemic cardiomyopathy with last known LVEF of 40 to 45%, 2D echocardiogram which was done 10/22. ?-- Continue carvedilol and nitrates ?-- Hold Plavix for now due to anemia ?(Patient has been off Plavix since hospital discharge 06/24/2021 due to worsening anemia.  This should be resumed when able.) ?-- Resume Plavix when anemia improves and okay'd by GI ?

## 2021-07-30 NOTE — Assessment & Plan Note (Addendum)
Stable.  Due to ischemic cardiomyopathy. Initially contributed to acute on chronic respiratory failure.   Given IV Lasix on 4/17 and underwent hemodialysis.   ?Volume status improved and stable.   ? ?Recent Echo on 05/2021 -- mildly decreased LV systolic function with global hypokinesis, mild to moderate MR/TR and mildly dilated LV/LA/RA.  ? ?--Cardiology following ?--On Coreg, Imdur ?--Resume hydralazine ?--Daily weights, strict I/O's ?--Renal diet, fluid restriction ?

## 2021-07-30 NOTE — Assessment & Plan Note (Signed)
Baseline O2 need is 3-4 L/min ?

## 2021-07-30 NOTE — Assessment & Plan Note (Signed)
Continue Celexa

## 2021-07-30 NOTE — Assessment & Plan Note (Addendum)
Presents to the ER for evaluation of worsening shortness of breath from his baseline and noted to have a hemoglobin of 6.7 ?His baseline hemoglobin is around 8 ?Patient states that he has had melena stools and chart review shows he had a recent upper endoscopy done in February which revealed gastritis.  ?--Plavix has been on hold ?--Continue IV Protonix.   ?--GI consulted, see their recommendations ?--Trend Hbg, transfuse if < 7 or actively bleeding ? 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  Due to O2 desats during procedure, colonoscopy was felt too high risk. ? ?Hemoglobin 7.9 today, stable. ? ?

## 2021-07-31 DIAGNOSIS — D649 Anemia, unspecified: Secondary | ICD-10-CM | POA: Diagnosis not present

## 2021-07-31 DIAGNOSIS — J9621 Acute and chronic respiratory failure with hypoxia: Secondary | ICD-10-CM | POA: Diagnosis not present

## 2021-07-31 LAB — BASIC METABOLIC PANEL
Anion gap: 4 — ABNORMAL LOW (ref 5–15)
BUN: 30 mg/dL — ABNORMAL HIGH (ref 8–23)
CO2: 31 mmol/L (ref 22–32)
Calcium: 7.9 mg/dL — ABNORMAL LOW (ref 8.9–10.3)
Chloride: 100 mmol/L (ref 98–111)
Creatinine, Ser: 3.43 mg/dL — ABNORMAL HIGH (ref 0.61–1.24)
GFR, Estimated: 17 mL/min — ABNORMAL LOW (ref >60)
Glucose, Bld: 96 mg/dL (ref 70–99)
Potassium: 3.4 mmol/L — ABNORMAL LOW (ref 3.5–5.1)
Sodium: 135 mmol/L (ref 135–145)

## 2021-07-31 LAB — CBC
HCT: 26.3 % — ABNORMAL LOW (ref 39.0–52.0)
Hemoglobin: 8.5 g/dL — ABNORMAL LOW (ref 13.0–17.0)
MCH: 29.4 pg (ref 26.0–34.0)
MCHC: 32.3 g/dL (ref 30.0–36.0)
MCV: 91 fL (ref 80.0–100.0)
Platelets: 133 10*3/uL — ABNORMAL LOW (ref 150–400)
RBC: 2.89 MIL/uL — ABNORMAL LOW (ref 4.22–5.81)
RDW: 15.9 % — ABNORMAL HIGH (ref 11.5–15.5)
WBC: 7.2 10*3/uL (ref 4.0–10.5)
nRBC: 0 % (ref 0.0–0.2)

## 2021-07-31 LAB — PHOSPHORUS: Phosphorus: 2.9 mg/dL (ref 2.5–4.6)

## 2021-07-31 LAB — MAGNESIUM: Magnesium: 1.7 mg/dL (ref 1.7–2.4)

## 2021-07-31 MED ORDER — MAGNESIUM SULFATE 2 GM/50ML IV SOLN
2.0000 g | Freq: Once | INTRAVENOUS | Status: AC
  Administered 2021-07-31: 2 g via INTRAVENOUS
  Filled 2021-07-31: qty 50

## 2021-07-31 MED ORDER — POTASSIUM CHLORIDE CRYS ER 20 MEQ PO TBCR
40.0000 meq | EXTENDED_RELEASE_TABLET | Freq: Two times a day (BID) | ORAL | Status: DC
  Administered 2021-07-31: 40 meq via ORAL
  Filled 2021-07-31: qty 2

## 2021-07-31 NOTE — Progress Notes (Addendum)
PHARMACY CONSULT NOTE ? ?Pharmacy Consult for Electrolyte Monitoring and Replacement  ? ?Recent Labs: ?Potassium (mmol/L)  ?Date Value  ?07/31/2021 3.4 (L)  ?12/12/2013 3.2 (L)  ? ?Magnesium (mg/dL)  ?Date Value  ?07/31/2021 1.7  ?12/10/2013 1.7 (L)  ? ?Calcium (mg/dL)  ?Date Value  ?07/31/2021 7.9 (L)  ? ?Calcium, Total (mg/dL)  ?Date Value  ?12/12/2013 8.1 (L)  ? ?Albumin (g/dL)  ?Date Value  ?07/28/2021 3.1 (L)  ?12/09/2013 3.0 (L)  ? ?Phosphorus (mg/dL)  ?Date Value  ?07/31/2021 2.9  ? ?Sodium (mmol/L)  ?Date Value  ?07/31/2021 135  ?12/12/2013 142  ? ?Corrected Ca: 8.92 mg/dL ? ?Assessment: 81 y.o. male with past medical history of ESRD, CHF, chronic hypoxic respiratory failure, here with shortness of breath secondary to symptomatic anemia and chronic hypoxic respiratory failure. The patient was found to be in new onset atrial fibrillation. Pharmacy is asked to follow and replace electrolytes. ? ?Goal of Therapy:  ?Electrolytes WNL ?K 4-5 ?Mag > 2 ? ?Plan: Discussed with nephrology. Next HD 4/21. ?Kcl PO 40 mEq x 1.  ?Magnesium 2 grams x 1. ?Recheck electrolytes in am ? ? ?Wynelle Cleveland, PharmD ?Pharmacy Resident  ?07/31/2021 ?7:28 AM ? ?

## 2021-07-31 NOTE — Progress Notes (Signed)
?[name]  ?ROUNDING NOTE  ? ?Subjective:  ? ?Peter [name] is a 81 year old male with past medical conditions including chronic systolic heart failure with EF 40 to 45%, chronic respiratory failure on 4 L nasal cannula, COPD, hypertension, CAD, and end-stage renal disease on hemodialysis.  Patient presents to the emergency department with complaints of shortness of breath progressively worse since yesterday.  Patient has been admitted for Acute respiratory failure ([place]) [J96.00] ?ESRD (end stage renal disease) ([place]) [N18.6] ?Symptomatic anemia [D64.9] ?Acute on chronic respiratory failure with hypoxemia ([place]) [J96.21] ? ?Patient is known to our practice and receives outpatient dialysis at [facility] on a MWF schedule, supervised by Dr. Holley Raring.  Patient has recently began dialysis treatments as of last month.  Last dialysis treatment received on Friday, received full treatment.  Also complains of increased fatigue.  Reports dark stools.  Denies chest or abdominal pain.  Denies use of NSAIDs.  Patient uses baseline 4 L oxygen, EMS for satting 80% on arrival.  Was given nebulizer and Solu-Medrol prior to transport to ED.  Patient seen in ED with family at bedside.  Appears very anxious stating he cannot breathe he is not getting enough oxygen.  Maintaining sats in 4 to 96% 4 L.  Denies nausea and vomiting. ? ?Scheduled for HD today ?Hgb is 8.5 this morning ?K 3.4 ?O 2 at 3L ?FOBT positive ? ? ? ? ?Objective:  ?Vital signs in last 24 hours:  ?Temp:  [97.5 ?F (36.4 ?C)-98.3 ?F (36.8 ?C)] 98.2 ?F (36.8 ?C) (04/20 0800) ?Pulse Rate:  [39-107] 71 (04/20 0800) ?Resp:  [11-26] 14 (04/20 0800) ?BP: (103-135)/(55-85) 127/74 (04/20 0800) ?SpO2:  [92 %-100 %] 96 % (04/20 0800) ?FiO2 (%):  [2 %] 2 % (04/20 0400) ?Weight:  [88.3 kg-89 kg] 88.3 kg (04/19 1619) ? ?Weight change:  ?Filed Weights  ? 07/28/21 1915 07/30/21 1255 07/30/21 1619  ?Weight: 87.9 kg 89 kg 88.3 kg  ? ? ?Intake/Output: ?I/O last 3  completed shifts: ?In: 465 [P.O.:600; Other:234] ?Out: 725 [Urine:225; Other:500] ?  ?Intake/Output this shift: ? No intake/output data recorded. ? ?Physical Exam: ?General: NAD, anxious, restless  ?Head: Normocephalic, atraumatic. Moist oral mucosal membranes  ?Eyes: Anicteric  ?Lungs:  Normal effort, clear to auscultation, [place] O2  ?Heart: Irregular rhythm  ?Abdomen:  Soft, nontender, nondistended  ?Extremities: Trace peripheral edema.  ?Neurologic: Nonfocal, moving all four extremities  ?Skin: No lesions  ?Access: Right PermCath  ? ? ?Basic Metabolic Panel: ?Recent Labs  ?Lab 07/28/21 ?0824 07/28/21 ?1549 07/29/21 ?0354 07/30/21 ?0316 07/31/21 ?0617  ?NA 138 137 136 135 135  ?K 4.0 4.6 3.9 3.9 3.4*  ?CL 103 102 100 99 100  ?CO2 '25 25 28 27 31  '$ ?GLUCOSE 109* 155* 120* 104* 96  ?BUN 38* 44* 27* 44* 30*  ?CREATININE 4.93* 5.54* 3.41* 4.69* 3.43*  ?CALCIUM 8.5* 8.7* 8.2* 8.2* 7.9*  ?MG  --   --  1.6* 1.8 1.7  ?PHOS  --  5.2* 4.2  --  2.9  ? ? ? ?Liver Function Tests: ?Recent Labs  ?Lab 07/28/21 ?0824 07/28/21 ?1549  ?AST 19  --   ?ALT 18  --   ?ALKPHOS 77  --   ?BILITOT 0.8  --   ?PROT 5.6*  --   ?ALBUMIN 3.1* 3.1*  ? ? ?No results for input(s): LIPASE, AMYLASE in the last 168 hours. ?No results for input(s): AMMONIA in the last 168 hours. ? ?CBC: ?Recent Labs  ?Lab 07/28/21 ?0824 07/28/21 ?  1549 07/28/21 ?2111 07/29/21 ?0439 07/29/21 ?0909 07/29/21 ?1513 07/30/21 ?0316 07/30/21 ?1650 07/31/21 ?0617  ?WBC 8.2 8.6  --  8.8  --   --  9.5  --  7.2  ?NEUTROABS 5.6  --   --   --   --   --   --   --   --   ?HGB 6.7* 6.8*   < > 6.8* 6.9* 7.5* 7.6* 7.9* 8.5*  ?HCT 21.7* 22.2*   < > 21.1* 21.9* 23.2* 24.0* 24.5* 26.3*  ?MCV 96.0 95.7  --  92.1  --   --  93.4  --  91.0  ?PLT 161 151  --  139*  --   --  131*  --  133*  ? < > = values in this interval not displayed.  ? ? ? ?Cardiac Enzymes: ?No results for input(s): CKTOTAL, CKMB, CKMBINDEX, TROPONINI in the last 168 hours. ? ?BNP: ?Invalid input(s): POCBNP ? ?CBG: ?Recent Labs   ?Lab 07/28/21 ?1116  ?GLUCAP 174*  ? ? ? ?Microbiology: ?Results for orders placed or performed during the hospital encounter of 07/28/21  ?Resp Panel by RT-PCR (Flu A&B, Covid) Nasopharyngeal Swab     Status: None  ? Collection Time: 07/28/21  8:24 AM  ? Specimen: Nasopharyngeal Swab; Nasopharyngeal(NP) swabs in vial transport medium  ?Result Value Ref Range Status  ? SARS Coronavirus 2 by RT PCR NEGATIVE NEGATIVE Final  ?  Comment: (NOTE) ?SARS-CoV-2 target nucleic acids are NOT DETECTED. ? ?The SARS-CoV-2 RNA is generally detectable in upper respiratory ?specimens during the acute phase of infection. The lowest ?concentration of SARS-CoV-2 viral copies this assay can detect is ?138 copies/mL. A negative result does not preclude SARS-Cov-2 ?infection and should not be used as the sole basis for treatment or ?other patient management decisions. A negative result may occur with  ?improper specimen collection/handling, submission of specimen other ?than nasopharyngeal swab, presence of viral mutation(s) within the ?areas targeted by this assay, and inadequate number of viral ?copies(<138 copies/mL). A negative result must be combined with ?clinical observations, patient history, and epidemiological ?information. The expected result is Negative. ? ?Fact Sheet for Patients:  ?EntrepreneurPulse.com.au ? ?Fact Sheet for Healthcare Providers:  ?IncredibleEmployment.be ? ?This test is no t yet approved or cleared by the [place] FDA and  ?has been authorized for detection and/or diagnosis of SARS-CoV-2 by ?FDA under an Emergency Use Authorization (EUA). This EUA will remain  ?in effect (meaning this test can be used) for the duration of the ?COVID-19 declaration under Section 564(b)(1) of the Act, 21 ?U.S.C.section 360bbb-3(b)(1), unless the authorization is terminated  ?or revoked sooner.  ? ? ?  ? Influenza A by PCR NEGATIVE NEGATIVE Final  ? Influenza B by PCR NEGATIVE NEGATIVE  Final  ?  Comment: (NOTE) ?The Xpert Xpress SARS-CoV-2/FLU/RSV plus assay is intended as an aid ?in the diagnosis of influenza from Nasopharyngeal swab specimens and ?should not be used as a sole basis for treatment. Nasal washings and ?aspirates are unacceptable for Xpert Xpress SARS-CoV-2/FLU/RSV ?testing. ? ?Fact Sheet for Patients: ?EntrepreneurPulse.com.au ? ?Fact Sheet for Healthcare Providers: ?IncredibleEmployment.be ? ?This test is not yet approved or cleared by the [place] FDA and ?has been authorized for detection and/or diagnosis of SARS-CoV-2 by ?FDA under an Emergency Use Authorization (EUA). This EUA will remain ?in effect (meaning this test can be used) for the duration of the ?COVID-19 declaration under Section 564(b)(1) of the Act, 21 U.S.C. ?section 360bbb-3(b)(1), unless the authorization is terminated  or ?revoked. ? ?Performed at [facility], [place], ?[place] [address] ?  ?MRSA Next Gen by PCR, Nasal     Status: None  ? Collection Time: 07/30/21 12:21 PM  ? Specimen: Nasal Mucosa; Nasal Swab  ?Result Value Ref Range Status  ? MRSA by PCR Next Gen NOT DETECTED NOT DETECTED Final  ?  Comment: (NOTE) ?The GeneXpert MRSA Assay (FDA approved for NASAL specimens only), ?is one component of a comprehensive MRSA colonization surveillance ?program. It is not intended to diagnose MRSA infection nor to guide ?or monitor treatment for MRSA infections. ?Test performance is not FDA approved in patients less than 2 years ?old. ?Performed at [facility], [place], ?[place] [address] ?  ? ? ?Coagulation Studies: ?No results for input(s): LABPROT, INR in the last 72 hours. ? ? ?Urinalysis: ?No results for input(s): COLORURINE, LABSPEC, [place], GLUCOSEU, HGBUR, BILIRUBINUR, KETONESUR, PROTEINUR, UROBILINOGEN, NITRITE, LEUKOCYTESUR in the last 72 hours. ? ?Invalid input(s): APPERANCEUR  ? ? ?Imaging: ?No results  found. ? ? ?Medications:  ? ? sodium chloride Stopped (07/28/21 0915)  ? sodium chloride    ? ? atorvastatin  40 mg Oral q1800  ? calcitRIOL  0.25 mcg Oral Daily  ? carvedilol  6.25 mg Oral BID WC  ? Chlorhexidine Gl

## 2021-07-31 NOTE — Progress Notes (Signed)
?Progress Note ? ? ?Patient: Peter [name] DOB: [birth year] DOA: 07/28/2021     3 ?DOS: the patient was seen and examined on 07/31/2021 ?  ?Brief hospital course: ? 81 y.o. male with medical history significant for ischemic cardiomyopathy with last known LVEF of 40 to 93%, chronic systolic heart failure, end-stage renal disease on hemodialysis (M/W/F), COPD with chronic respiratory failure on 4 L of oxygen, coronary artery disease, hypertension admitted for worsening shortness of breath, dark stools and anemia.  He received 1 unit pRBC for Hbg 6.7 on admission. ? ?Cardiology consulted for new onset A-fib and elevated troponin, acute on chronic HFrEF. ? ?4/18: 1 unit pRBC transfusion for Hbg 6.9.  GI consult, respiratory status makes sedation too high risk.   ? ?4/19: Hbg 7.6.  O2 requirement back to baseline.  Anxiety improved with low dose Xanax. ? ?4/20: Hbg improved to 8.5.  Respiratory status at baseline.  GI updated.  Has been off Plavix since discharge last month due to worsening anemia. ? ?Assessment and Plan: ?* Acute on chronic respiratory failure with hypoxia (HCC) ?Patient has an underlying history of COPD with chronic respiratory failure and is on 3-4 L of oxygen continuous at home. ?He presents for evaluation of worsening shortness of breath from his baseline associated with tachypnea and accessory muscle use ?Initially required BiPAP in the stepdown unit. ?Weaned to 2 L oxygen by nasal cannula. ?--Supplement O2 to keep sats 90-95%  ?--Bipap as needed ?--Close pulmonology follow up ? ?Symptomatic anemia ?Presents to the ER for evaluation of worsening shortness of breath from his baseline and noted to have a hemoglobin of 6.7 ?His baseline hemoglobin is around 8 ?Patient states that he has had melena stools and chart review shows he had a recent upper endoscopy done in February which revealed gastritis.  ?--Plavix has been on hold ?--Continue IV Protonix.   ?--GI consulted, see their  recommendations ?-- Considering EGD tomorrow if respiratory status remains stable ? ?Hemoglobin improved to 8.5 this morning ?--Trend Hbg, transfuse if < 7 or actively bleeding ? ?Atrial fibrillation ([place]) ?New onset A-fib, paroxysmal.  ?CHA2DS2-VASc score of 5 and ideally will require long-term anticoagulation as primary prophylaxis for an acute stroke, currently with acute anemia from blood loss. ?--Continue Coreg for rate control ?--Anticoagulation currently contraindicated due to acute blood loss ?--Cardiology input appreciated ? ?Ischemic cardiomyopathy ?Patient has a known history of ischemic cardiomyopathy with last known LVEF of 40 to 45%, 2D echocardiogram which was done 10/22. ?-- Continue carvedilol and nitrates ?-- Hold Plavix for now due to anemia ?(Patient has been off Plavix since hospital discharge 06/24/2021 due to worsening anemia.  This should be resumed when able.) ? ?ESRD (end stage renal disease) ([place]) ?Dialysis days are M/W/F ?Dialysis per nephro while in hospital ? ?Depression ?Continue Celexa ? ?COPD (chronic obstructive pulmonary disease) with emphysema ([place]) ?Continue scheduled and as needed bronchodilator therapy.  Continue inhaled steroids. ? ?4/19-20: stable on 2 L oxygen via nasal cannula.  ? ?Chronic respiratory failure with hypoxia ([place]) ?Baseline O2 need is 3-4 L/min ? ?Elevated troponin ?Due to demand ischemia from anemia ?Unable to administer any antiplatelets for now due to symptomatic anemia ? ?Acute on chronic systolic CHF (congestive heart failure) ([place]) ?Stable.  Initially contributed to acute on chronic respiratory failure.  Given IV Lasix on 4/17 and underwent hemodialysis.  Volume status improved.   ? ?Recent Echo on 05/2021 -- mildly decreased LV systolic function with global hypokinesis, mild to moderate MR/TR and  mildly dilated LV/LA/RA.  ? ?--Cardiology following ?--On Coreg, Imdur ?--Resume hydralazine ?--Daily weights, strict I/O's ?--Renal diet, fluid  restriction ? ? ? ? ?  ? ?Subjective: Patient seen awake, seated edge of bed in ICU this morning.  He reports breathing feels stable and his normal at this point.  Occasionally feels slightly more short of breath but recovers well.  No reports of bleeding.  He inquires when endoscopic evaluation able to be done.  He expresses concern about his Plavix being held and will happen with his stent. ? ?Physical Exam: ?Vitals:  ? 07/31/21 0500 07/31/21 0600 07/31/21 0800 07/31/21 1200  ?BP:   127/74 (!) 117/59  ?Pulse: 63 70 71 60  ?Resp: (!) 22 (!) 23 14 (!) 21  ?Temp:   98.2 ?F (36.8 ?C)   ?TempSrc:   Oral   ?SpO2: 95% 96% 96% 100%  ?Weight:      ?Height:      ? ?General exam: awake, alert, no acute distress, chronically ill-appearing ?HEENT: Wearing glasses, moist mucus membranes, hearing grossly normal  ?Respiratory system: CTAB with good air movement, no wheezes, rales or rhonchi, normal respiratory effort at rest on 2 L minute [place] O2. ?Cardiovascular system: normal S1/S2, RRR, no pedal edema.   ?Gastrointestinal system: soft, nontender, nondistended. ?Central nervous system: A&O x4. no gross focal neurologic deficits, normal speech ?Extremities: moves all, no edema, normal tone ?Skin: dry, intact, normal temperature ?Psychiatry: normal mood, congruent affect, judgement and insight appear normal ? ? ? ?Data Reviewed: ? ?Notable labs: Hemoglobin improved to 8.5 from 7.9.  Potassium 3.4.  BUN 30, creatinine 3.43, calcium 7.9, GFR 17, platelets 133 ? ?Family Communication: None at bedside on rounds today.  Family were at bedside on rounds yesterday. ? ?Disposition: ?Status is: Inpatient ?Remains inpatient appropriate because: Ongoing evaluation of GI bleeding underway, not appropriate for the outpatient setting.   ? ? Planned Discharge Destination: Home with Home Health ? ? ? ?Time spent: 40 minutes ? ?Author: ?Ezekiel Slocumb, DO ?07/31/2021 2:13 PM ? ?For on call review [email or web address].  ?

## 2021-07-31 NOTE — Progress Notes (Signed)
[facility] Cardiology ? ? ? ?SUBJECTIVE: 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 The patient was found to be in new onset atrial fibrillation thus cardiology was consulted.  ? ? ?The patient is feeling well on today and does not have any complaints at this time. He continues to be on supplemental oxygen at 3L via [place] with oxygen saturation of 100%. He is resting comfortably in bed with rails up and on the bedside monitoring system.   ? ? ?(07/30/2021): The patient is not feeling well on today due to the dyspnea and states " I feel like I have to think hard about breathing;" furthermore, he requests that his supplemental oxygen is increased at this time. He denies having any chest pain, peripheral edema or dizziness; however, he has not been out of bed since being admitted.  ? ? ?(07/29/21): The patient is feeling significantly better on today per his reports. He continues to have dyspnea, but states that it has improved overnight. He is currently on 2L of oxygen via [place] with oxygen saturations at 100%. The patient endorse intermittent palpitations which are at his baseline and denies having any worsening in the symptoms at this time. He denies having any chest pain, peripheral edema, dizziness or syncope.  ? ? ?Vitals:  ? 07/31/21 0400 07/31/21 0500 07/31/21 0600 07/31/21 0800  ?BP: 133/77   127/74  ?Pulse: (!) 107 63 70 71  ?Resp: (!) 23 (!) 22 (!) 23 14  ?Temp: 98.2 ?F (36.8 ?C)   98.2 ?F (36.8 ?C)  ?TempSrc: Oral   Oral  ?SpO2: 92% 95% 96% 96%  ?Weight:      ?Height:      ? ? ? ?Intake/Output Summary (Last 24 hours)  at 07/31/2021 1211 ?Last data filed at 07/31/2021 1007 ?Gross per 24 hour  ?Intake 480 ml  ?Output 725 ml  ?Net -245 ml  ? ? ? ? ?PHYSICAL EXAM ? ?General: Well developed, well nourished, in no acute distress.  ?HEENT:  Normocephalic and atraumatic. PERRL ?Neck:  No JVD.  ?Lungs: Clear bilaterally to auscultation. Chest expansion symmetrical. Negative for wheezing, rales or rhonchi. Normal effort of breathing without accessory muscle use.  ?Heart: HRRR. Normal S1 and S2 without gallops  ?Abdomen: Bowel sounds are positive, abdomen soft and non-tender  ?Msk:  Normal strength and tone for age. ?Extremities: Mild, nonpitting BLE edema. Bruising to the BUE. No clubbing or cyanosis.  ?Neuro: Alert and oriented X 3. ?Psych:  Good affect, responds appropriately ? ? ?LABS: ?Basic Metabolic Panel: ?Recent Labs  ?  07/29/21 ?0439 07/30/21 ?0316 07/31/21 ?0617  ?NA 136 135 135  ?K 3.9 3.9 3.4*  ?CL 100 99 100  ?CO2 '28 27 31  '$ ?GLUCOSE 120* 104* 96  ?BUN 27* 44* 30*  ?CREATININE 3.41* 4.69* 3.43*  ?CALCIUM 8.2* 8.2* 7.9*  ?MG 1.6* 1.8 1.7  ?PHOS 4.2  --  2.9  ? ?Liver Function Tests: ?Recent Labs  ?  07/28/21 ?1549  ?ALBUMIN 3.1*  ? ?No results for input(s): LIPASE, AMYLASE in the last 72 hours. ?CBC: ?Recent Labs  ?  07/30/21 ?0316 07/30/21 ?1650 07/31/21 ?0617  ?WBC 9.5  --  7.2  ?HGB 7.6* 7.9* 8.5*  ?HCT 24.0* 24.5* 26.3*  ?MCV 93.4  --  91.0  ?PLT 131*  --  133*  ? ?Cardiac Enzymes: ?No results for input(s): CKTOTAL, CKMB, CKMBINDEX, TROPONINI in the last 72 hours. ?BNP: ?Invalid input(s): POCBNP ?D-Dimer: ?No results for input(s): DDIMER in the last 72 hours. ?Hemoglobin A1C: ?No results for input(s): HGBA1C in the last 72 hours. ?Fasting Lipid Panel: ?No results for input(s): CHOL, HDL, LDLCALC, TRIG, CHOLHDL, LDLDIRECT in the last 72 hours. ?Thyroid Function Tests: ?No results for input(s): TSH, T4TOTAL, T3FREE, THYROIDAB in the last 72 hours. ? ?Invalid input(s): FREET3 ?Anemia Panel: ?No results for input(s): VITAMINB12,  FOLATE, FERRITIN, TIBC, IRON, RETICCTPCT in the last 72 hours. ? ? ?No results found. ? ? ?Echocardiogram: (05/29/21) ?IMPRESSIONS  ? 1. Left ventricular ejection fraction, by estimation, is 40 to 45%. The  ?left ventricle has mildly decreased function. The left ventricle  ?demonstrates global hypokinesis. The left ventricular internal cavity size  ?was mildly dilated. Left ventricular  ?diastolic parameters were normal.  ? 2. Right ventricular systolic function is normal. The right ventricular  ?size is normal.  ? 3. Left atrial size was mildly dilated.  ? 4. Right atrial size was mildly dilated.  ? 5. The mitral valve is normal in structure. Mild to moderate mitral valve  ?regurgitation.  ? 6. Tricuspid valve regurgitation is mild to moderate.  ? 7. The aortic valve is normal in structure. Aortic valve regurgitation is  ?not visualized.  ? ?TELEMETRY: SR with LBBB, frequent multifocal PVC's and HR of 74 bpm ? ?ASSESSMENT AND PLAN: ? ?Principal Problem: ?  Acute on chronic respiratory failure with hypoxia ([place]) ?Active Problems: ?  Acute on chronic systolic CHF (congestive heart failure) ([place]) ?  Ischemic cardiomyopathy ?  Elevated troponin ?  Chronic respiratory failure with hypoxia (HCC) ?  Symptomatic anemia ?  Atrial fibrillation ([place]) ?  ESRD (end stage renal disease) ([place]) ?  COPD (chronic obstructive pulmonary disease) with emphysema ([place]) ?  Depression ?  ? ?# New onset atrial fibrillation, paroxysmal ?Controlled. Rate controlled. Upon admission on 4/17, the patient's ECG was highly suspicious for atrial fibrillation with a LBBB; the HR was 75 bpm and there was no identifiable p waves. Beside telemetry on 4/18 reveals SR with LBBB, frequent PVC's and HR at 64 bpm, no paced beats present. Telemetry on today 4/20 continues to revealed SR with LBBB, frequent PVC's and a HR of 69 bpm without any paced beats. No episodes of atrial fibrillation overnight noted upon telemetry review.The patient's CHADS2VASc is 5.   ? - anticoagulation contraindicated at this time due to symptomatic anemia. Consider restarting once H/H is back to baseline.  ? - continue rate control with coreg.  ? - continuous telemetry monitoring until discharge.  ? ?# Elevated troponin  ?# CAD s/p PCI/stent x3 (2018) ?Stable. Likely secondary to demand ischemia in the presence of acute on chronic HFrEF and acute on chronic respiratory failure.  HS Troponin relatively flat at 269>>292>>295 (previously peaked at 3324 during march 2022 admission). The patient is not experiencing any chest pain at this time and ECG does not show any acute ischemic changes.  ? - HFrEF management as below.  ? - plavix held due to symptomatic anemia.  ? - continue coreg and atorvastatin.  ? ?# HFrEF, acute on chronic  ?Fairly stable.BNP was elevated at 3382.3 upon admission and the patient was treated with IV lasix on 4/17 and underwent dialysis which  has helped to improve his fluid status. He appears euvolemic at this time, symptoms of dyspnea likely secondary to a/c respiratory failure. His most recent echocardiogram fromm 05/2021 revealed mildly decreased LV systolic function with global hypokinesis, mild to moderate MR/TR and mildly dilated LV/LA/RA.  ? - continue coreg and imdur.   ? - continue hydralazine '100mg'$  by mouth three times daily as blood pressure allows.  ? - daily weights, strict I&O's, renal diet.  ? ?#HTN ?Chronic. Stable. BP=127/74 map =89 ? - coreg as above.  ? - continue hydralazine '100mg'$  by mouth three times daily as blood pressure allows.  ? ? ?# acute on chronic respiratory failure with hypoxia ?# COPD with emphysema  ?# symptomatic anemia ?Fairly stable at this time. Patient required bipap upon admission on 4/17, but it was discontinued this morning of 4/18. The patient is now on supplemental oxygen at 3 L via [place] and his current oxygen saturation is 98%. His dyspnea has worsened overnight and would like to increase his O2 via [place] at this time. His inital hgb  upon admission was 6.7. Received 2 units of PRBC since admission and has positive fecal occult on 4/19. GI consulted but limited testing due to patient's dyspnea making him an increased risk for anesthe

## 2021-07-31 NOTE — Progress Notes (Signed)
Per patient request called friend Opal Sidles and notified of transfer to room 144.  Report called to Levada Dy, Therapist, sports.  Patient transported via wheelchair. ?

## 2021-07-31 NOTE — Care Management Important Message (Signed)
Important Message ? ?Patient Details  ?Name: Peter [name] ?MRN: [id number] ?Date of Birth: [birth year] ? ? ?Medicare Important Message Given:  N/A - LOS <3 / Initial given by admissions ? ? ? ? ?[name] ?07/31/2021, 4:37 PM ?

## 2021-07-31 NOTE — Progress Notes (Signed)
? ?[name] , MD ?[address] [place], [place], [place], [address] ?[address] [place], [place], [place], [address] ?Phone: [phone]  ?Fax: [phone] ? ? ?Peter [name] is being followed for melena  ? ?Subjective: ?Doing well some blood in stool , no pain  ? ? ?Objective: ?Vital signs in last 24 hours: ?Vitals:  ? 07/31/21 0500 07/31/21 0600 07/31/21 0800 07/31/21 1200  ?BP:   127/74 (!) 117/59  ?Pulse: 63 70 71 60  ?Resp: (!) 22 (!) 23 14 (!) 21  ?Temp:   98.2 ?F (36.8 ?C)   ?TempSrc:   Oral   ?SpO2: 95% 96% 96% 100%  ?Weight:      ?Height:      ? ?Weight change:  ? ?Intake/Output Summary (Last 24 hours) at 07/31/2021 1309 ?Last data filed at 07/31/2021 1007 ?Gross per 24 hour  ?Intake 480 ml  ?Output 725 ml  ?Net -245 ml  ? ? ? ?Exam: ?Heart:: S1S2 present or without murmur or extra heart sounds ?Lungs: normal ?Abdomen: soft, nontender, normal bowel sounds ? ? ?  Latest Ref Rng & Units 07/31/2021  ?  6:17 AM 07/30/2021  ?  4:50 PM 07/30/2021  ?  3:16 AM  ?CBC  ?WBC 4.0 - 10.5 K/uL 7.2    9.5    ?Hemoglobin 13.0 - 17.0 g/dL 8.5   7.9   7.6    ?Hematocrit 39.0 - 52.0 % 26.3   24.5   24.0    ?Platelets 150 - 400 K/uL 133    131    ? ? ?Lab Results: ?'@LABTEST2'$ @ ?Micro Results: ?Recent Results (from the past 240 hour(s))  ?Resp Panel by RT-PCR (Flu A&B, Covid) Nasopharyngeal Swab     Status: None  ? Collection Time: 07/28/21  8:24 AM  ? Specimen: Nasopharyngeal Swab; Nasopharyngeal(NP) swabs in vial transport medium  ?Result Value Ref Range Status  ? SARS Coronavirus 2 by RT PCR NEGATIVE NEGATIVE Final  ?  Comment: (NOTE) ?SARS-CoV-2 target nucleic acids are NOT DETECTED. ? ?The SARS-CoV-2 RNA is generally detectable in upper respiratory ?specimens during the acute phase of infection. The lowest ?concentration of SARS-CoV-2 viral copies this assay can detect is ?138 copies/mL. A negative result does not preclude SARS-Cov-2 ?infection and should not be used as the sole basis for treatment or ?other  patient management decisions. A negative result may occur with  ?improper specimen collection/handling, submission of specimen other ?than nasopharyngeal swab, presence of viral mutation(s) within the ?areas targeted by this assay, and inadequate number of viral ?copies(<138 copies/mL). A negative result must be combined with ?clinical observations, patient history, and epidemiological ?information. The expected result is Negative. ? ?Fact Sheet for Patients:  ?EntrepreneurPulse.com.au ? ?Fact Sheet for Healthcare Providers:  ?IncredibleEmployment.be ? ?This test is no t yet approved or cleared by the [place] FDA and  ?has been authorized for detection and/or diagnosis of SARS-CoV-2 by ?FDA under an Emergency Use Authorization (EUA). This EUA will remain  ?in effect (meaning this test can be used) for the duration of the ?COVID-19 declaration under Section 564(b)(1) of the Act, 21 ?U.S.C.section 360bbb-3(b)(1), unless the authorization is terminated  ?or revoked sooner.  ? ? ?  ? Influenza A by PCR NEGATIVE NEGATIVE Final  ? Influenza B by PCR NEGATIVE NEGATIVE Final  ?  Comment: (NOTE) ?The Xpert Xpress SARS-CoV-2/FLU/RSV plus assay is intended as an aid ?in the diagnosis of influenza from Nasopharyngeal swab specimens and ?should not be used as a sole  basis for treatment. Nasal washings and ?aspirates are unacceptable for Xpert Xpress SARS-CoV-2/FLU/RSV ?testing. ? ?Fact Sheet for Patients: ?EntrepreneurPulse.com.au ? ?Fact Sheet for Healthcare Providers: ?IncredibleEmployment.be ? ?This test is not yet approved or cleared by the [place] FDA and ?has been authorized for detection and/or diagnosis of SARS-CoV-2 by ?FDA under an Emergency Use Authorization (EUA). This EUA will remain ?in effect (meaning this test can be used) for the duration of the ?COVID-19 declaration under Section 564(b)(1) of the Act, 21 U.S.C. ?section  360bbb-3(b)(1), unless the authorization is terminated or ?revoked. ? ?Performed at [facility], [place], ?[place] [address] ?  ?MRSA Next Gen by PCR, Nasal     Status: None  ? Collection Time: 07/30/21 12:21 PM  ? Specimen: Nasal Mucosa; Nasal Swab  ?Result Value Ref Range Status  ? MRSA by PCR Next Gen NOT DETECTED NOT DETECTED Final  ?  Comment: (NOTE) ?The GeneXpert MRSA Assay (FDA approved for NASAL specimens only), ?is one component of a comprehensive MRSA colonization surveillance ?program. It is not intended to diagnose MRSA infection nor to guide ?or monitor treatment for MRSA infections. ?Test performance is not FDA approved in patients less than 2 years ?old. ?Performed at [facility], [place], ?[place] [address] ?  ? ?Studies/Results: ?No results found. ?Medications: I have reviewed the patient's current medications. ?Scheduled Meds: ? atorvastatin  40 mg Oral q1800  ? calcitRIOL  0.25 mcg Oral Daily  ? carvedilol  6.25 mg Oral BID WC  ? Chlorhexidine Gluconate Cloth  6 each Topical Q0600  ? citalopram  20 mg Oral Daily  ? epoetin (EPOGEN/PROCRIT) injection  8,000 Units Intravenous Q M,W,F-HD  ? finasteride  5 mg Oral Daily  ? hydrALAZINE  100 mg Oral Q8H  ?  HYDROmorphone (DILAUDID) injection  0.5 mg Intravenous Once  ? isosorbide mononitrate  30 mg Oral Daily  ? loratadine  10 mg Oral Daily  ? mouth rinse  15 mL Mouth Rinse BID  ? mometasone-formoterol  2 puff Inhalation BID  ? pantoprazole (PROTONIX) IV  40 mg Intravenous Q24H  ? potassium chloride  40 mEq Oral Q12H  ? sodium chloride flush  3 mL Intravenous Q12H  ? tamsulosin  0.4 mg Oral Daily  ? umeclidinium bromide  1 puff Inhalation Daily  ? cyanocobalamin  1,000 mcg Oral Daily  ? ?Continuous Infusions: ? sodium chloride Stopped (07/28/21 0915)  ? sodium chloride    ? ?PRN Meds:.sodium chloride, acetaminophen **OR** acetaminophen, albuterol, ALPRAZolam, ondansetron **OR** ondansetron (ZOFRAN)  IV, sodium chloride flush ? ? ?Assessment: ?Principal Problem: ?  Acute on chronic respiratory failure with hypoxia (HCC) ?Active Problems: ?  Acute on chronic systolic CHF (congestive heart failure) ([place]) ?  Ischemic cardiomyopathy ?  Elevated troponin ?  Chronic respiratory failure with hypoxia (HCC) ?  Symptomatic anemia ?  Atrial fibrillation ([place]) ?  ESRD (end stage renal disease) ([place]) ?  COPD (chronic obstructive pulmonary disease) with emphysema ([place]) ?  Depression ? ?Peter [name] is a 81 y.o. y/o male with history of ischemic cardiomyopathy, CKD presents to the emergency in the room with shortness of breath being treated for acute respiratory failure on BiPAP at admission .  I have been consulted for lower than normal hemoglobin.  History of possible dark stools.  Patient had been on Plavix apparently stoped in march , patient unsure .  Apparently the patient had an EGD in February that revealed gastritis but patient says  he has never had one - no records on epic , he recollects a colonoscopy many years back .  Hemoglobin stable has improved over the past few days from 7.9 g to 8.5 g today. ?  ?Plan ?1.   Monitor CBC and transfuse as needed.  Maintain 2 large-bore IV cannulas and IV PPI. ? ?2. EGD tomorrow and if negative colonoscopy day after ? ? ?I have discussed alternative options, risks & benefits,  which include, but are not limited to, bleeding, infection, perforation,respiratory complication & drug reaction.  The patient agrees with this plan & written consent will be obtained.   ?  ? ? ? LOS: 3 days  ? ?[name], MD ?07/31/2021, 1:09 PM ? ?

## 2021-07-31 NOTE — Evaluation (Signed)
Physical Therapy Evaluation ?Patient Details ?Name: Peter [name] ?MRN: [id number] ?DOB: 1941-04-12 ?Today's Date: 07/31/2021 ? ?History of Present Illness ? Peter [name] is a patient with a history of ischemic cardiomyopathy, end-stage renal disease on dialysis chronic respiratory failure on oxygen came into the ER short of breath over last 24 hours ?  ?Clinical Impression ? Patient received in recliner sleeping. Wakes easily to my voice. He is agreeable to PT session. He is supervision for sit to stand from recliner. Ambulated 150 feet with RW and supervision. Patient will continue to benefit from skilled PT to improve activity tolerance and strength for eventual return home independently.    ?   ? ?Recommendations for follow up therapy are one component of a multi-disciplinary discharge planning process, led by the attending physician.  Recommendations may be updated based on patient status, additional functional criteria and insurance authorization. ? ?Follow Up Recommendations Skilled nursing-short term rehab (<3 hours/day) ? ?  ?Assistance Recommended at Discharge Intermittent Supervision/Assistance  ?Patient can return home with the following ? A little help with walking and/or transfers;A little help with bathing/dressing/bathroom;Assist for transportation;Help with stairs or ramp for entrance;Assistance with cooking/housework ? ?  ?Equipment Recommendations None recommended by PT  ?Recommendations for Other Services ?    ?  ?Functional Status Assessment Patient has had a recent decline in their functional status and demonstrates the ability to make significant improvements in function in a reasonable and predictable amount of time.  ? ?  ?Precautions / Restrictions Precautions ?Precaution Comments: mod fall ?Restrictions ?Weight Bearing Restrictions: No  ? ?  ? ?Mobility ? Bed Mobility ?  ?  ?  ?  ?  ?  ?  ?General bed mobility comments: NT, patient in recliner ?  ? ?Transfers ?Overall transfer level:  Modified independent ?Equipment used: Rolling walker (2 wheels) ?  ?  ?  ?  ?  ?  ?  ?  ?  ? ?Ambulation/Gait ?Ambulation/Gait assistance: Supervision ?Gait Distance (Feet): 150 Feet ?Assistive device: Rolling walker (2 wheels) ?Gait Pattern/deviations: Step-through pattern, Decreased step length - right, Decreased step length - left, Decreased stride length ?Gait velocity: slightly decr ?  ?  ?General Gait Details: patient ambulating well with RW and supervision. No lob. Fatigued at end of session. O2 saturations remained in 90%s throughout on room air. ? ?Stairs ?  ?  ?  ?  ?  ? ?Wheelchair Mobility ?  ? ?Modified Rankin (Stroke Patients Only) ?  ? ?  ? ?Balance Overall balance assessment: Modified Independent ?  ?  ?  ?  ?  ?  ?  ?  ?  ?  ?  ?  ?  ?  ?  ?  ?  ?  ?   ? ? ? ?Pertinent Vitals/Pain Pain Assessment ?Pain Assessment: No/denies pain  ? ? ?Home Living Family/patient expects to be discharged to:: Skilled nursing facility ?  ?  ?  ?  ?  ?  ?  ?  ?  ?Additional Comments: Patient has been at [facility] for rehab for ~3 weeks. Plans to return home after rehab.  ?  ?Prior Function Prior Level of Function : Independent/Modified Independent;Driving ?  ?  ?  ?  ?  ?  ?Mobility Comments: Has been using walker recently since hospitalization and rehab ?ADLs Comments: Prior to illness at end of November, patient was I with all ADLs and IADLs including driving.  He enjoys going to the grocery store, time with  friends and family, and doing things around his home. ?  ? ? ?Hand Dominance  ?   ? ?  ?Extremity/Trunk Assessment  ? Upper Extremity Assessment ?Upper Extremity Assessment: Overall WFL for tasks assessed ?  ? ?Lower Extremity Assessment ?Lower Extremity Assessment: Overall WFL for tasks assessed ?  ? ?Cervical / Trunk Assessment ?Cervical / Trunk Assessment: Normal  ?Communication  ? Communication: HOH  ?Cognition Arousal/Alertness: Awake/alert ?Behavior During Therapy: [facility] for tasks assessed/performed ?Overall  Cognitive Status: Within Functional Limits for tasks assessed ?  ?  ?  ?  ?  ?  ?  ?  ?  ?  ?  ?  ?  ?  ?  ?  ?  ?  ?  ? ?  ?General Comments   ? ?  ?Exercises    ? ?Assessment/Plan  ?  ?PT Assessment Patient needs continued PT services  ?PT Problem List Decreased strength;Decreased mobility;Decreased activity tolerance;Cardiopulmonary status limiting activity ? ?   ?  ?PT Treatment Interventions DME instruction;Therapeutic exercise;Gait training;Functional mobility training;Therapeutic activities;Patient/family education   ? ?PT Goals (Current goals can be found in the Care Plan section)  ?Acute Rehab PT Goals ?Patient Stated Goal: to return to rehab and then home ?PT Goal Formulation: With patient ?Time For Goal Achievement: 08/14/21 ?Potential to Achieve Goals: Fair ? ?  ?Frequency Min 2X/week ?  ? ? ?Co-evaluation   ?  ?  ?  ?  ? ? ?  ?AM-PAC PT "6 Clicks" Mobility  ?Outcome Measure Help needed turning from your back to your side while in a flat bed without using bedrails?: A Little ?Help needed moving from lying on your back to sitting on the side of a flat bed without using bedrails?: A Little ?Help needed moving to and from a bed to a chair (including a wheelchair)?: A Little ?Help needed standing up from a chair using your arms (e.g., wheelchair or bedside chair)?: A Little ?Help needed to walk in hospital room?: A Little ?Help needed climbing 3-5 steps with a railing? : A Lot ?6 Click Score: 17 ? ?  ?End of Session Equipment Utilized During Treatment: Gait belt;Oxygen ?Activity Tolerance: Patient tolerated treatment well ?Patient left: in chair;with call bell/phone within reach ?Nurse Communication: Mobility status ?PT Visit Diagnosis: Muscle weakness (generalized) (M62.81) ?  ? ?Time: 8921-1941 ?PT Time Calculation (min) (ACUTE ONLY): 25 min ? ? ?Charges:   PT Evaluation ?$PT Eval Moderate Complexity: 1 Mod ?PT Treatments ?$Gait Training: 8-22 mins ?  ?   ? ? ?Amanda Cockayne, PT, GCS ?07/31/21,3:08  PM ? ? ?

## 2021-07-31 NOTE — Progress Notes (Addendum)
Dr. Candiss Norse notified of potassium of 3.4. Does not want potassium replaced. Will fix in dialysis.  Also asked about mag and said hold off ? ? ?

## 2021-07-31 NOTE — Plan of Care (Signed)

## 2021-08-01 ENCOUNTER — Inpatient Hospital Stay: Payer: PPO

## 2021-08-01 ENCOUNTER — Encounter
Admission: EM | Disposition: A | Discharge status: Hospice | Payer: Self-pay | Source: Skilled Nursing Facility | Attending: Internal Medicine

## 2021-08-01 ENCOUNTER — Inpatient Hospital Stay: Payer: PPO | Admitting: Certified Registered Nurse Anesthetist

## 2021-08-01 ENCOUNTER — Encounter: Payer: Self-pay | Admitting: Internal Medicine

## 2021-08-01 DIAGNOSIS — E7800 Pure hypercholesterolemia, unspecified: Secondary | ICD-10-CM | POA: Diagnosis not present

## 2021-08-01 DIAGNOSIS — J9621 Acute and chronic respiratory failure with hypoxia: Secondary | ICD-10-CM | POA: Diagnosis not present

## 2021-08-01 DIAGNOSIS — K921 Melena: Secondary | ICD-10-CM | POA: Diagnosis not present

## 2021-08-01 DIAGNOSIS — K31819 Angiodysplasia of stomach and duodenum without bleeding: Secondary | ICD-10-CM

## 2021-08-01 HISTORY — PX: ESOPHAGOGASTRODUODENOSCOPY (EGD) WITH PROPOFOL: SHX5813

## 2021-08-01 LAB — BASIC METABOLIC PANEL
Anion gap: 7 (ref 5–15)
BUN: 43 mg/dL — ABNORMAL HIGH (ref 8–23)
CO2: 31 mmol/L (ref 22–32)
Calcium: 8.3 mg/dL — ABNORMAL LOW (ref 8.9–10.3)
Chloride: 98 mmol/L (ref 98–111)
Creatinine, Ser: 4.44 mg/dL — ABNORMAL HIGH (ref 0.61–1.24)
GFR, Estimated: 13 mL/min — ABNORMAL LOW (ref >60)
Glucose, Bld: 102 mg/dL — ABNORMAL HIGH (ref 70–99)
Potassium: 4.1 mmol/L (ref 3.5–5.1)
Sodium: 136 mmol/L (ref 135–145)

## 2021-08-01 LAB — CBC
HCT: 24.9 % — ABNORMAL LOW (ref 39.0–52.0)
Hemoglobin: 7.9 g/dL — ABNORMAL LOW (ref 13.0–17.0)
MCH: 29.4 pg (ref 26.0–34.0)
MCHC: 31.7 g/dL (ref 30.0–36.0)
MCV: 92.6 fL (ref 80.0–100.0)
Platelets: 127 10*3/uL — ABNORMAL LOW (ref 150–400)
RBC: 2.69 MIL/uL — ABNORMAL LOW (ref 4.22–5.81)
RDW: 15.6 % — ABNORMAL HIGH (ref 11.5–15.5)
WBC: 6.8 10*3/uL (ref 4.0–10.5)
nRBC: 0 % (ref 0.0–0.2)

## 2021-08-01 LAB — BLOOD GAS, ARTERIAL
Acid-Base Excess: 5.5 mmol/L — ABNORMAL HIGH (ref 0.0–2.0)
Bicarbonate: 31.1 mmol/L — ABNORMAL HIGH (ref 20.0–28.0)
FIO2: 40 %
MECHVT: 500 mL
Mechanical Rate: 16
O2 Saturation: 97.5 %
PEEP: 5 cmH2O
Patient temperature: 37
pCO2 arterial: 48 mmHg (ref 32–48)
pH, Arterial: 7.42 (ref 7.35–7.45)
pO2, Arterial: 86 mmHg (ref 83–108)

## 2021-08-01 LAB — BRAIN NATRIURETIC PEPTIDE: B Natriuretic Peptide: 3632 pg/mL — ABNORMAL HIGH (ref 0.0–100.0)

## 2021-08-01 LAB — MAGNESIUM: Magnesium: 2 mg/dL (ref 1.7–2.4)

## 2021-08-01 LAB — PHOSPHORUS: Phosphorus: 3.3 mg/dL (ref 2.5–4.6)

## 2021-08-01 SURGERY — ESOPHAGOGASTRODUODENOSCOPY (EGD) WITH PROPOFOL
Anesthesia: General

## 2021-08-01 MED ORDER — LIDOCAINE HCL (CARDIAC) PF 100 MG/5ML IV SOSY
PREFILLED_SYRINGE | INTRAVENOUS | Status: DC | PRN
Start: 2021-08-01 — End: 2021-08-01
  Administered 2021-08-01: 100 mg via INTRAVENOUS

## 2021-08-01 MED ORDER — ATORVASTATIN CALCIUM 20 MG PO TABS: 40.0000 mg | ORAL_TABLET | Freq: Every day | ORAL | Status: DC

## 2021-08-01 MED ORDER — ETOMIDATE 2 MG/ML IV SOLN
20.0000 mg | Freq: Once | INTRAVENOUS | Status: AC
  Administered 2021-08-01: 20 mg via INTRAVENOUS

## 2021-08-01 MED ORDER — MIDAZOLAM HCL 2 MG/2ML IJ SOLN: 2.0000 mg | INTRAMUSCULAR | Status: DC | PRN

## 2021-08-01 MED ORDER — ONDANSETRON HCL 4 MG/2ML IJ SOLN
4.0000 mg | Freq: Four times a day (QID) | INTRAMUSCULAR | Status: DC | PRN
Start: 2021-08-01 — End: 2021-08-08

## 2021-08-01 MED ORDER — PROPOFOL 500 MG/50ML IV EMUL
INTRAVENOUS | Status: DC | PRN
  Administered 2021-08-01: 50 ug/kg/min via INTRAVENOUS

## 2021-08-01 MED ORDER — FENTANYL 2500MCG IN NS 250ML (10MCG/ML) PREMIX INFUSION
INTRAVENOUS | Status: AC
  Filled 2021-08-01: qty 250

## 2021-08-01 MED ORDER — FENTANYL CITRATE (PF) 100 MCG/2ML IJ SOLN
100.0000 ug | Freq: Once | INTRAMUSCULAR | Status: AC
  Administered 2021-08-01: 100 ug via INTRAVENOUS

## 2021-08-01 MED ORDER — MIDAZOLAM HCL 2 MG/2ML IJ SOLN
1.0000 mg | INTRAMUSCULAR | Status: DC | PRN
  Administered 2021-08-02 – 2021-08-03 (×3): 1 mg via INTRAVENOUS
  Filled 2021-08-01 (×3): qty 2

## 2021-08-01 MED ORDER — DOCUSATE SODIUM 50 MG/5ML PO LIQD
100.0000 mg | Freq: Two times a day (BID) | ORAL | Status: DC
  Administered 2021-08-01 – 2021-08-02 (×3): 100 mg
  Filled 2021-08-01 (×3): qty 10

## 2021-08-01 MED ORDER — SODIUM CHLORIDE 0.9% FLUSH: 10.0000 mL | INTRAVENOUS | Status: DC | PRN

## 2021-08-01 MED ORDER — VITAMIN B-12 1000 MCG PO TABS
1000.0000 ug | ORAL_TABLET | Freq: Every day | ORAL | Status: DC
  Administered 2021-08-02: 1000 ug
  Filled 2021-08-01 (×2): qty 1

## 2021-08-01 MED ORDER — PROPOFOL 10 MG/ML IV BOLUS
INTRAVENOUS | Status: DC | PRN
  Administered 2021-08-01: 50 mg via INTRAVENOUS
  Administered 2021-08-01 (×2): 10 mg via INTRAVENOUS

## 2021-08-01 MED ORDER — SODIUM CHLORIDE 0.9 % IV SOLN
1.0000 g | INTRAVENOUS | Status: DC
  Administered 2021-08-01 – 2021-08-03 (×3): 1 g via INTRAVENOUS
  Filled 2021-08-01 (×3): qty 1

## 2021-08-01 MED ORDER — CITALOPRAM HYDROBROMIDE 20 MG PO TABS
20.0000 mg | ORAL_TABLET | Freq: Every day | ORAL | Status: DC
  Administered 2021-08-02: 20 mg
  Filled 2021-08-01 (×2): qty 1

## 2021-08-01 MED ORDER — LORAZEPAM 2 MG/ML IJ SOLN
INTRAMUSCULAR | Status: AC
  Filled 2021-08-01: qty 1

## 2021-08-01 MED ORDER — LORAZEPAM 2 MG/ML IJ SOLN
1.0000 mg | Freq: Once | INTRAMUSCULAR | Status: AC
  Administered 2021-08-01: 1 mg via INTRAVENOUS

## 2021-08-01 MED ORDER — HEPARIN SODIUM (PORCINE) 1000 UNIT/ML IJ SOLN
INTRAMUSCULAR | Status: AC
  Filled 2021-08-01: qty 10

## 2021-08-01 MED ORDER — GLYCOPYRROLATE 0.2 MG/ML IJ SOLN
INTRAMUSCULAR | Status: DC | PRN
  Administered 2021-08-01: .2 mg via INTRAVENOUS

## 2021-08-01 MED ORDER — CALCITRIOL 0.25 MCG PO CAPS
0.2500 ug | ORAL_CAPSULE | Freq: Every day | ORAL | Status: DC
  Administered 2021-08-02: 0.25 ug
  Filled 2021-08-01 (×4): qty 1

## 2021-08-01 MED ORDER — SODIUM CHLORIDE 0.9% FLUSH
10.0000 mL | Freq: Two times a day (BID) | INTRAVENOUS | Status: DC
  Administered 2021-08-01 – 2021-08-08 (×10): 10 mL

## 2021-08-01 MED ORDER — POLYETHYLENE GLYCOL 3350 17 G PO PACK
17.0000 g | PACK | Freq: Every day | ORAL | Status: DC
  Administered 2021-08-01 – 2021-08-02 (×2): 17 g
  Filled 2021-08-01 (×3): qty 1

## 2021-08-01 MED ORDER — ROCURONIUM BROMIDE 50 MG/5ML IV SOLN
90.0000 mg | Freq: Once | INTRAVENOUS | Status: AC
  Administered 2021-08-01: 90 mg via INTRAVENOUS
  Filled 2021-08-01: qty 9

## 2021-08-01 MED ORDER — SODIUM CHLORIDE 0.9 % IV SOLN
INTRAVENOUS | Status: DC
  Administered 2021-08-01: 500 mL via INTRAVENOUS

## 2021-08-01 MED ORDER — ONDANSETRON HCL 4 MG PO TABS: 4.0000 mg | ORAL_TABLET | Freq: Four times a day (QID) | ORAL | Status: DC | PRN

## 2021-08-01 MED ORDER — MIDAZOLAM HCL 2 MG/2ML IJ SOLN: 1.0000 mg | INTRAMUSCULAR | Status: DC | PRN

## 2021-08-01 MED ORDER — FENTANYL 2500MCG IN NS 250ML (10MCG/ML) PREMIX INFUSION
0.0000 ug/h | INTRAVENOUS | Status: DC
  Administered 2021-08-01: 200 ug/h via INTRAVENOUS
  Administered 2021-08-02: 150 ug/h via INTRAVENOUS
  Administered 2021-08-02: 100 ug/h via INTRAVENOUS
  Filled 2021-08-01 (×2): qty 250

## 2021-08-01 MED ORDER — ALPRAZOLAM 0.25 MG PO TABS: 0.2500 mg | ORAL_TABLET | Freq: Two times a day (BID) | ORAL | Status: DC | PRN

## 2021-08-01 MED ORDER — EPOETIN ALFA 10000 UNIT/ML IJ SOLN
INTRAMUSCULAR | Status: AC
Start: 2021-08-01 — End: 2021-08-02
  Filled 2021-08-01: qty 1

## 2021-08-01 NOTE — Consult Note (Signed)
Pharmacy Antibiotic Note ? ?Peter [name] is a 81 y.o. male admitted on 07/28/2021 with pneumonia.  Pharmacy has been consulted for cefepime dosing. ? ?Plan: ?Cefepime 1 g q24H. Pt has ESRD on HD MWF.  ? ?Height: '5\' 11"'$  (180.3 cm) ?Weight: 99.6 kg (219 lb 9.3 oz) ?IBW/kg (Calculated) : 75.3 ? ?Temp (24hrs), Avg:97.9 ?F (36.6 ?C), Min:97.5 ?F (36.4 ?C), Max:98.7 ?F (37.1 ?C) ? ?Recent Labs  ?Lab 07/28/21 ?1549 07/29/21 ?7035 07/30/21 ?0093 07/31/21 ?0617 08/01/21 ?8182  ?WBC 8.6 8.8 9.5 7.2 6.8  ?CREATININE 5.54* 3.41* 4.69* 3.43* 4.44*  ?  ?Estimated Creatinine Clearance: 15.7 mL/min (A) (by C-G formula based on SCr of 4.44 mg/dL (H)).   ? ?Allergies  ?Allergen Reactions  ? Iodinated Contrast Media Shortness Of Breath  ? Iodinated Glycerol  [Glycerol, Iodinated] Shortness Of Breath  ? ? ?Antimicrobials this admission: ?4/21 cefepime >>  ? ?Dose adjustments this admission: ?None ? ?Microbiology results: ?4/21 Resp Cx: pending  ? ? ?Thank you for allowing pharmacy to be a part of this patient?s care. ? ?Oswald Hillock, PharmD ?08/01/2021 7:21 PM ? ?

## 2021-08-01 NOTE — Anesthesia Postprocedure Evaluation (Signed)
Anesthesia Post Note ? ?Patient: Peter [name] ? ?Procedure(s) Performed: ESOPHAGOGASTRODUODENOSCOPY (EGD) WITH PROPOFOL ? ?Patient location during evaluation: Endoscopy ?Anesthesia Type: General ?Level of consciousness: awake and alert ?Pain management: pain level controlled ?Vital Signs Assessment: post-procedure vital signs reviewed and stable ?Respiratory status: spontaneous breathing, nonlabored ventilation, respiratory function stable and patient connected to nasal cannula oxygen ?Cardiovascular status: blood pressure returned to baseline and stable ?Postop Assessment: no apparent nausea or vomiting ?Anesthetic complications: no ? ? ?No notable events documented. ? ? ?Last Vitals:  ?Vitals:  ? 08/01/21 1342 08/01/21 1354  ?BP:    ?Pulse:  64  ?Resp:  15  ?Temp: 36.4 ?C   ?SpO2:  100%  ?  ?Last Pain:  ?Vitals:  ? 08/01/21 1342  ?TempSrc: Oral  ?PainSc: 0-No pain  ? ? ?  ?  ?  ?  ?  ?  ? ?Martha Clan ? ? ? ? ?

## 2021-08-01 NOTE — Anesthesia Procedure Notes (Signed)
Date/Time: 08/01/2021 10:18 AM ?Performed by: Demetrius Charity, CRNA ?Pre-anesthesia Checklist: Patient identified, Emergency Drugs available, Suction available, Patient being monitored and Timeout performed ?Patient Re-evaluated:Patient Re-evaluated prior to induction ?Oxygen Delivery Method: Nasal cannula ?Induction Type: IV induction ?Placement Confirmation: CO2 detector and positive ETCO2 ? ? ? ? ?

## 2021-08-01 NOTE — Progress Notes (Signed)
?   08/01/21 1900  ?Clinical Encounter Type  ?Visited With Patient and family together  ?Visit Type Follow-up  ?Referral From Nurse  ?Consult/Referral To Chaplain  ? ?Chaplain responded to nurse page. Patient intubated and family requested support from [place]. Chaplain provided compassionate presence and reflective listening for patient's friend and family members. Family appreciated Chaplain visit. ?

## 2021-08-01 NOTE — Progress Notes (Signed)
PT Cancellation Note ? ?Patient Details ?Name: Peter [name] ?MRN: [id number] ?DOB: [birth year] ? ? ?Cancelled Treatment:    Reason Eval/Treat Not Completed: Other (comment). Pt out of room at this time, PT to re-attempt as able.  ? ? ?Lieutenant Diego PT, DPT ?9:56 AM,08/01/21 ? ?

## 2021-08-01 NOTE — Progress Notes (Signed)
? ?NAME:  Peter [name], MRN:  [id number], DOB:  [birth year], LOS: 4 ?ADMISSION DATE:  07/28/2021, CONSULTATION DATE:  07/28/2021 ?REFERRING MD:  Dr.Agbata, CHIEF COMPLAINT:  Shortness of Breath  ? ?Brief Pt Description / Synopsis:  ?81 y.o. Male admitted with Acute on Chronic Hypoxic Respiratory Failure in the setting of Symptomatic Anemia, volume overload, atelectasis, & questionable AECOPD requiring BiPAP. ? ?History of Present Illness:  ?Peter [name] is a 81 year old male with a past medical history significant for COPD requiring 4 L of supplemental O2 chronically, ESRD on hemodialysis, HFrEF (LVEF 40 to 45%), ischemic cardiomyopathy, hypertension, coronary artery disease who presented to [facility] ED on 07/28/2021 due to progressive shortness of breath and fatigue/weakness. ? ?He reports that at baseline he is usually short of breath, however this is worsened over the last 2 to 3 days.  He reports associated wheezing and nonproductive cough.  He had his pulse ox checked which was 80% on 4 L.  He denies fever or chills, worsening of bilateral lower extremity edema, chest pain, nausea, vomiting, diarrhea, abdominal pain, palpitations..  He does report symptoms are worse when laying flat and with any form of exertion.  He reports he has been compliant and not missed any scheduled dialysis appointment, which she completed his last session on Friday 4/14.  He reported dark stools, but denied hematochezia or hematemesis. ? ?EMS gave Solu-Medrol in route to the ED.  Given his work of breathing, he was placed on BiPAP in the ED. ? ?ED Course: ?Initial vital signs: Temperature 97.6, respiratory rate 20, pulse 80, blood pressure 147/90, 96% ?Significant Labs: Glucose 109, BUN 38, creatinine 4.93, BNP 3382, high-sensitivity troponin 269, procalcitonin less than 0.1, WBC 8.2, hemoglobin 6.7, hematocrit 21.7 ?COVID-19 PCR negative ?Imaging: ?Chest x-ray>>IMPRESSION: ?1. Increased opacity at the RIGHT lung base. This could  represent ?pneumonia, aspiration, atelectasis or asymmetric pulmonary edema. ?Favor atelectasis given the elevation of the RIGHT hemidiaphragm ?suggesting associated volume loss. ?2. Chronic interstitial lung disease/fibrosis. ?3. Emphysema. ?Medications given: 80 mg IV Lasix, 0.5 IV Dilaudid ? ?Hospitalist admitting the patient to stepdown unit, PCCM asked to consult for assistance with acute on chronic hypoxic respiratory failure requiring BiPAP. ? ?Pertinent  Medical History  ?COPD requiring 4 L of oxygen chronically ?ESRD on hemodialysis ?HFrEF (LVEF 40 to 45%) ?Ischemic cardiomyopathy ?Coronary artery disease ?Hypertension ? ?Micro Data:  ?4/17: SARS-CoV-2 and influenza PCR>>negative ?4/21: Respiratory>>negative  ? ? ?Antimicrobials:  ?Cefepime 04/21>> ? ?Significant Hospital Events: ?Including procedures, antibiotic start and stop dates in addition to other pertinent events   ?4/17: Presented to ED with shortness of breath, placed on BiPAP.  Admitted by the hospitalist, PCCM consulted.  To receive 1 unit of blood with hemodialysis today due to anemia pt does take plavix ?4/18: PCCM team signed off  ?4/18: GI consulted  ?4/21: Upper endoscopy revealed normal esophagus. Normal examined duodenum. 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 Treated with argon beam coagulation. No specimens collected. ?4/21: Rapid response called during hemodialysis pt developed severe hypoxic respiratory failure minimally responsive requiring transfer to ICU for emergent mechanical intubation  ? ?Interim History / Subjective:  ?Pt arrived from hemodialysis in acute hypoxic respiratory failure minimally responsive on 100% NRB requiring mechanical intubation  ? ?Objective   ?Blood pressure (!) 111/56, pulse 72, temperature 98.7 ?F (37.1 ?C), temperature source Axillary, resp. rate 16, height '5\' 11"'$  (1.803 m), weight 99.6 kg, SpO2 96 %. ?   ?Vent Mode: PRVC ?FiO2 (%):  [40 %-100 %] 40 % ?Set  Rate:  [16 bmp] 16 bmp ?Vt Set:  [500  mL] 500 mL ?PEEP:  [5 cmH20] 5 cmH20  ? ?Intake/Output Summary (Last 24 hours) at 08/01/2021 1851 ?Last data filed at 08/01/2021 1040 ?Gross per 24 hour  ?Intake 150 ml  ?Output --  ?Net 150 ml  ? ?Filed Weights  ? 08/01/21 1342 08/01/21 1735 08/01/21 1749  ?Weight: 94.9 kg 90.9 kg 99.6 kg  ? ? ?Examination: ?General: Acute on chronically ill-appearing male,  sedated NAD mechanically intubated  ?HEENT: JVD present, supple  ?Lungs: Faint rhonchi throughout, even, non labored  ?Cardiovascular: Paced rhythm, no murmurs, rubs, gallops, 2+ radial/1+ distal pulses, 2+ bilateral lower extremity edema  ?Abdomen: + BS x4, obese, soft, non distended  ?Extremities: Normal tone  ?Neuro: Sedated, not following commands, PERRL ?GU: Patient is oliguric at baseline ? ?Resolved Hospital Problem list   ? ? ?Assessment & Plan:  ? ?Acute on chronic hypoxic respiratory failure secondary to HCAP, pulmonary edema, and bilateral pleural effusions  ?-Mechanical ventilation via ARDS protocol, target PRVC 6 cc/kg ?-Wean PEEP and FiO2 as able to maintain O2 sats >88% ?-Goal plateau pressure less than 30, driving pressure less than 15 ?-PAD protocol, goal RASS 0 to -1: fentanyl gtt and prn versed  ? ?Symptomatic Anemia s/p EGD 04/21 revealed three non-bleeding angioectasias in the stomach. Treated with argon beam coagulation. ?-Monitor for S/Sx of bleeding ?-Trend CBC ?-SCD's for VTE Prophylaxis  ?-Transfuse for Hgb <7 ?-Continue PPI ?-GI consulted appreciate input  ? ?Mildly Elevated Troponin, suspect demand ischemia ?New onset Atrial Fibrillation~resolved  ?Acute on chronic HFrEF (LVEF 40-45% on Echo 10/22) ?PMHx: Ischemic cardiomyopathy ?-Continuous cardiac monitoring ?-Maintain MAP >65 ?-Will repeat Echo   ?-No anticoagulation or antiplatelets given anemia ?-Hold outpatient antihypertensives due to soft bp readings  ?-Cardiology following, appreciate input ? ?ESRD on Hemodialysis ?-Monitor I&O's / urinary output ?-Follow BMP ?-Ensure adequate  renal perfusion ?-Avoid nephrotoxic agents as able ?-Replace electrolytes as indicated ?-Nephrology consulted, appreciate input ~HD as per nephrology ? ?Best Practice (right click and "Reselect all SmartList Selections" daily)  ? ?Diet/type: NPO ?DVT prophylaxis: SCD ?GI prophylaxis: PPI ?Lines: N/A ?Foley:  N/A ?Code Status:  Partial Code ?Last date of multidisciplinary goals of care discussion [N/A] ? ?Discussed decline in pts condition with pts daughter Julian Reil via telephone.  Discussed code status Mrs. Lieutenant Diego decided to change pts code status to partial code agreed with mechanical intubation only. If pt were to cardiac arrest she would not want to proceed with CPR, ACLS medications, or cardioversion/defibrillation  ? ?Labs   ?CBC: ?Recent Labs  ?Lab 07/28/21 ?0824 07/28/21 ?1549 07/28/21 ?2111 07/29/21 ?0439 07/29/21 ?0909 07/29/21 ?1513 07/30/21 ?0316 07/30/21 ?1650 07/31/21 ?0617 08/01/21 ?0962  ?WBC 8.2 8.6  --  8.8  --   --  9.5  --  7.2 6.8  ?NEUTROABS 5.6  --   --   --   --   --   --   --   --   --   ?HGB 6.7* 6.8*   < > 6.8*   < > 7.5* 7.6* 7.9* 8.5* 7.9*  ?HCT 21.7* 22.2*   < > 21.1*   < > 23.2* 24.0* 24.5* 26.3* 24.9*  ?MCV 96.0 95.7  --  92.1  --   --  93.4  --  91.0 92.6  ?PLT 161 151  --  139*  --   --  131*  --  133* 127*  ? < > = values in this interval  not displayed.  ? ? ?Basic Metabolic Panel: ?Recent Labs  ?Lab 07/28/21 ?1549 07/29/21 ?7048 07/30/21 ?8891 07/31/21 ?0617 08/01/21 ?6945  ?NA 137 136 135 135 136  ?K 4.6 3.9 3.9 3.4* 4.1  ?CL 102 100 99 100 98  ?CO2 '25 28 27 31 31  '$ ?GLUCOSE 155* 120* 104* 96 102*  ?BUN 44* 27* 44* 30* 43*  ?CREATININE 5.54* 3.41* 4.69* 3.43* 4.44*  ?CALCIUM 8.7* 8.2* 8.2* 7.9* 8.3*  ?MG  --  1.6* 1.8 1.7 2.0  ?PHOS 5.2* 4.2  --  2.9 3.3  ? ?GFR: ?Estimated Creatinine Clearance: 15.7 mL/min (A) (by C-G formula based on SCr of 4.44 mg/dL (H)). ?Recent Labs  ?Lab 07/28/21 ?0824 07/28/21 ?1549 07/29/21 ?0388 07/30/21 ?8280 07/31/21 ?0617 08/01/21 ?0349   ?PROCALCITON <0.10  --   --   --   --   --   ?WBC 8.2   < > 8.8 9.5 7.2 6.8  ? < > = values in this interval not displayed.  ? ? ?Liver Function Tests: ?Recent Labs  ?Lab 07/28/21 ?0824 07/28/21 ?1549  ?AST 19  --

## 2021-08-01 NOTE — Anesthesia Preprocedure Evaluation (Signed)
Anesthesia Evaluation  ?Patient identified by MRN, date of birth, ID band ?Patient awake ? ? ? ?Reviewed: ?Allergy & Precautions, H&P , NPO status , Patient's Chart, lab work & pertinent test results, reviewed documented beta blocker date and time  ? ?History of Anesthesia Complications ?Negative for: history of anesthetic complications ? ?Airway ?Mallampati: III ? ?TM Distance: >3 FB ?Neck ROM: full ? ? ? Dental ? ?(+) Dental Advidsory Given, Missing, Caps, Poor Dentition ?  ?Pulmonary ?shortness of breath and with exertion, asthma , COPD, neg recent URI, former smoker,  ?  ?Pulmonary exam normal ?breath sounds clear to auscultation ? ? ? ? ? ? Cardiovascular ?Exercise Tolerance: Good ?hypertension, (-) angina+ CAD, + Past MI, + Cardiac Stents, + Peripheral Vascular Disease and +CHF  ?(-) CABG + dysrhythmias (-) Valvular Problems/Murmurs ?Rhythm:regular Rate:Normal ? ? ?  ?Neuro/Psych ?PSYCHIATRIC DISORDERS Depression negative neurological ROS ?   ? GI/Hepatic ?Neg liver ROS, GERD  ,  ?Endo/Other  ?negative endocrine ROS ? Renal/GU ?ESRF and DialysisRenal disease  ?negative genitourinary ?  ?Musculoskeletal ? ? Abdominal ?  ?Peds ? Hematology ?negative hematology ROS ?(+)   ?Anesthesia Other Findings ?Past Medical History: ?No date: Anginal pain ([place]) ?No date: Asthma ?03/2011: Bladder infection, acute ?    Comment:  "had a whole lot of bleeding from this" ?No date: CHF (congestive heart failure) ([place]) ?No date: Coronary artery disease ?No date: High cholesterol ?No date: Hypertension ?12/14/2011: Macular degeneration ?    Comment:  "had it in my right; getting shots now in my left" ?1996: Myocardial infarction [facility]) ?12/14/2011: NSTEMI (non-ST elevated myocardial infarction) ([place]) ?12/14/2011: Shortness of breath ?    Comment:  "@ rest, lying down, w/exertion" ? ? Reproductive/Obstetrics ?negative OB ROS ? ?  ? ? ? ? ? ? ? ? ? ? ? ? ? ?  ?  ? ? ? ? ? ? ? ? ?Anesthesia  Physical ?Anesthesia Plan ? ?ASA: 4 ? ?Anesthesia Plan: General  ? ?Post-op Pain Management:   ? ?Induction: Intravenous ? ?PONV Risk Score and Plan: 2 and Propofol infusion and TIVA ? ?Airway Management Planned: Natural Airway and Nasal Cannula ? ?Additional Equipment:  ? ?Intra-op Plan:  ? ?Post-operative Plan:  ? ?Informed Consent: I have reviewed the patients History and Physical, chart, labs and discussed the procedure including the risks, benefits and alternatives for the proposed anesthesia with the patient or authorized representative who has indicated his/her understanding and acceptance.  ? ? ? ?Dental Advisory Given ? ?Plan Discussed with: Anesthesiologist, CRNA and Surgeon ? ?Anesthesia Plan Comments:   ? ? ? ? ? ? ?Anesthesia Quick Evaluation ? ?

## 2021-08-01 NOTE — H&P (Signed)
? ? ? ?Peter Bellows, MD ?[address] [place], [place], [place], [address] ?[address], [place], [place], [place], [address] ?Phone: [phone]  ?Fax: [phone] ? ?Primary Care Physician:  Tracie Harrier, MD ? ? ?Pre-Procedure History & Physical: ?HPI:  Peter [name] is a 81 y.o. male is here for an endoscopy  ?  ?Past Medical History:  ?Diagnosis Date  ? Anginal pain ([place])   ? Asthma   ? Bladder infection, acute 03/2011  ? "had a whole lot of bleeding from this"  ? CHF (congestive heart failure) ([place])   ? Coronary artery disease   ? High cholesterol   ? Hypertension   ? 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 "had it in my right; getting shots now in my left"  ? Myocardial infarction [facility]) 1996  ? NSTEMI (non-ST elevated myocardial infarction) ([place]) 12/14/2011  ? Shortness of breath 12/14/2011  ? "@ rest, lying down, w/exertion"  ? ? ?Past Surgical History:  ?Procedure Laterality Date  ? [place]; ~ 2003; ~ 2008  ? "total of 3"  ? CORONARY STENT INTERVENTION N/A 08/10/2016  ? Procedure: Coronary Stent Intervention;  Surgeon: [name], MD;  Location: [place] [facility];  Service: Cardiovascular;  Laterality: N/A;  ? DIALYSIS/PERMA CATHETER INSERTION N/A 06/17/2021  ? Procedure: DIALYSIS/PERMA CATHETER INSERTION;  Surgeon: Katha Cabal, MD;  Location: [place] [facility];  Service: Cardiovascular;  Laterality: N/A;  ? HERNIA REPAIR  ~ 2001  ? "abdominal w/mesh implanted"  ? LEFT HEART CATH AND CORONARY ANGIOGRAPHY N/A 08/10/2016  ? Procedure: Left Heart Cath and Coronary Angiography;  Surgeon: [name], MD;  Location: [place] [facility];  Service: Cardiovascular;  Laterality: N/A;  ? LEFT HEART CATHETERIZATION WITH CORONARY ANGIOGRAM N/A 12/16/2011  ? Procedure: LEFT HEART CATHETERIZATION WITH CORONARY ANGIOGRAM;  Surgeon: Minus Breeding, MD;  Location: [facility] CATH LAB;  Service: Cardiovascular;  Laterality: N/A;  ? PERCUTANEOUS CORONARY STENT  INTERVENTION (PCI-S) N/A 12/17/2011  ? Procedure: PERCUTANEOUS CORONARY STENT INTERVENTION (PCI-S);  Surgeon: Sherren Mocha, MD;  Location: [facility] CATH LAB;  Service: Cardiovascular;  Laterality: N/A;  ? TONSILLECTOMY    ? "I was a kid"  ? ? ?Prior to Admission medications   ?Medication Sig Start Date End Date Taking? Authorizing Provider  ?amLODipine (NORVASC) 10 MG tablet Take 1 tablet (10 mg total) by mouth daily. 06/03/21 09/01/21 Yes Enzo Bi, MD  ?aspirin EC 81 MG tablet Take 81 mg by mouth daily. Swallow whole.   Yes [provider]  ?atorvastatin (LIPITOR) 40 MG tablet Take 1 tablet (40 mg total) by mouth daily at 6 PM. 12/18/11  Yes Annita Brod, MD  ?calcitRIOL (ROCALTROL) 0.25 MCG capsule Take 0.25 mcg by mouth daily.   Yes [provider]  ?carvedilol (COREG) 6.25 MG tablet Take 1 tablet (6.25 mg total) by mouth 2 (two) times daily with a meal. 06/03/21 09/01/21 Yes Enzo Bi, MD  ?cetirizine (ZYRTEC) 10 MG tablet Take 10 mg by mouth.   Yes [provider]  ?citalopram (CELEXA) 20 MG tablet Take 20 mg by mouth daily. 05/22/21  Yes [provider]  ?cyanocobalamin 1000 MCG tablet Take 1,000 mcg by mouth daily.    Yes [provider]  ?finasteride (PROSCAR) 5 MG tablet Take 1 tablet (5 mg total) by mouth daily. 06/03/21 09/01/21 Yes Enzo Bi, MD  ?GLUCOSAMINE-CHONDROITIN-VIT C PO Take 1 tablet by mouth daily.   Yes [provider]  ?hydrALAZINE (APRESOLINE)  100 MG tablet Take 1 tablet (100 mg total) by mouth 3 (three) times daily. 06/03/21 09/01/21 Yes Enzo Bi, MD  ?isosorbide mononitrate (IMDUR) 30 MG 24 hr tablet Take 1 tablet (30 mg total) by mouth daily. 06/03/21 09/01/21 Yes Enzo Bi, MD  ?omeprazole (PRILOSEC) 20 MG capsule Take 20 mg by mouth in the morning.   Yes [provider]  ?tamsulosin (FLOMAX) 0.4 MG CAPS capsule Take 1 capsule (0.4 mg total) by mouth daily. 06/03/21 09/01/21 Yes Enzo Bi, MD  ?umeclidinium bromide (INCRUSE ELLIPTA) 62.5  MCG/ACT AEPB Inhale 1 puff into the lungs daily. 06/03/21 09/01/21 Yes Enzo Bi, MD  ?clobetasol cream (TEMOVATE) 4.96 % Apply 1 application topically 2 (two) times daily as needed (psoriasis).    [provider]  ?clopidogrel (PLAVIX) 75 MG tablet Take 75 mg by mouth daily. ?Patient not taking: Reported on 07/31/2021    [provider]  ? ? ?Allergies as of 07/28/2021 - Review Complete 07/28/2021  ?Allergen Reaction Noted  ? Iodinated contrast media Shortness Of Breath 10/29/2014  ? Iodinated glycerol  [glycerol, iodinated] Shortness Of Breath 02/20/2015  ? ? ?Family History  ?Family history unknown: Yes  ? ? ?Social History  ? ?Socioeconomic History  ? Marital status: Widowed  ?  Spouse name: Not on file  ? Number of children: Not on file  ? Years of education: Not on file  ? Highest education level: Not on file  ?Occupational History  ? Not on file  ?Tobacco Use  ? Smoking status: Former  ?  Packs/day: 0.50  ?  Years: 0.50  ?  Pack years: 0.25  ?  Types: Cigarettes  ? Smokeless tobacco: Never  ? Tobacco comments:  ?  11/23/16 Still not ready to quit smoking.  ?Substance and Sexual Activity  ? Alcohol use: Yes  ?  Alcohol/week: 1.0 standard drink  ?  Types: 1 Cans of beer per week  ?  Comment: 12/14/2011 "if my kidneys are sore, I drink 1 beer/day; if not sore; no beer; occasionally drink socially; ave 1 beer/wk maybe"  ? Drug use: No  ? Sexual activity: Not Currently  ?Other Topics Concern  ? Not on file  ?Social History Narrative  ? Not on file  ? ?Social Determinants of Health  ? ?Financial Resource Strain: Not on file  ?Food Insecurity: Not on file  ?Transportation Needs: Not on file  ?Physical Activity: Not on file  ?Stress: Not on file  ?Social Connections: Not on file  ?Intimate Partner Violence: Not on file  ? ? ?Review of Systems: ?See HPI, otherwise negative ROS ? ?Physical Exam: ?BP (!) 144/65 (BP Location: Left Arm)   Pulse 61   Temp 97.6 ?F (36.4 ?C)   Resp 15   Ht '5\' 11"'$  (1.803 m)    Wt 88.3 kg   SpO2 98%   BMI 27.15 kg/m?  ?General:   Alert,  pleasant and cooperative in NAD ?Head:  Normocephalic and atraumatic. ?Neck:  Supple; no masses or thyromegaly. ?Lungs:  Clear throughout to auscultation, normal respiratory effort.    ?Heart:  +S1, +S2, Regular rate and rhythm, No edema. ?Abdomen:  Soft, nontender and nondistended. Normal bowel sounds, without guarding, and without rebound.   ?Neurologic:  Alert and  oriented x4;  grossly normal neurologically. ? ?Impression/Plan: ?DARIAN CANSLER is here for an endoscopy  to be performed for  evaluation of gi bleed ?   ?Risks, benefits, limitations, and alternatives regarding endoscopy have been reviewed with the  patient.  Questions have been answered.  All parties agreeable. ? ? ?Peter Bellows, MD  08/01/2021, 10:05 AM ? ?

## 2021-08-01 NOTE — [place] FL2 (Signed)
?[place] MEDICAID FL2 LEVEL OF CARE SCREENING TOOL  ?  ? ?IDENTIFICATION  ?Patient Name: ?Peter [name] Birthdate: October 08, 1940 Sex: male Admission Date (Current Location): ?07/28/2021  ?[place] and [place] Number: ? [place] ?  Facility and Address:  ?[facility], [address] [place], [place] [address] ?     Provider Number: ?[id number]  ?Attending Physician Name and Address:  ?Ezekiel Slocumb, DO ? Relative Name and Phone Number:  ?Johnny Bridge [phone] ?   ?Current Level of Care: ?Hospital Recommended Level of Care: ?[place] Prior Approval Number: ?  ? ?Date Approved/Denied: ?  PASRR Number: ?[id number] A ? ?Discharge Plan: ?  ?  ? ?Current Diagnoses: ?Patient Active Problem List  ? Diagnosis Date Noted  ? Symptomatic anemia 07/28/2021  ? Acute on chronic respiratory failure with hypoxia ([place]) 07/28/2021  ? Atrial fibrillation ([place]) 07/28/2021  ? ESRD (end stage renal disease) ([place]) 07/28/2021  ? COPD (chronic obstructive pulmonary disease) with emphysema ([place]) 07/28/2021  ? Depression 07/28/2021  ? Chronic respiratory failure with hypoxia ([place]) 06/11/2021  ? HLD (hyperlipidemia) 06/10/2021  ? CKD (chronic kidney disease), stage IV ([place]) 06/10/2021  ? Elevated troponin   ? Chest pain 05/28/2021  ? Acute exacerbation of CHF (congestive heart failure) ([place]) 04/27/2021  ? CKD (chronic kidney disease) stage 4, GFR 15-29 ml/min (HCC) 04/04/2021  ? 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 Acute anemia 04/04/2021  ? Thrombocytopenia ([place]) 04/04/2021  ? ABLA (acute blood loss anemia) 04/04/2021  ? AKI (acute kidney injury) ([place]) 04/04/2021  ? Normocytic anemia   ? 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 CKD (chronic kidney disease) stage 3, GFR 30-59 ml/min (HCC) 05/18/2018  ? Elevated hemoglobin A1c 05/18/2018  ? History of skin cancer 05/18/2018  ?  Ischemic cardiomyopathy 12/16/2017  ? S/P ICD (internal cardiac defibrillator) procedure 12/16/2017  ? Acute on chronic systolic CHF (congestive heart failure) ([place]) 09/07/2016  ? Congestive heart failure ([place]) 01/17/2015  ? Current tobacco use 01/17/2015  ? Hypercholesterolemia 01/17/2015  ? Peripheral vascular disease ([place]) 01/17/2015  ? 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 Arthritis, degenerative 07/22/2013  ? H/O angina pectoris 07/22/2013  ? Adiposity 07/22/2013  ? 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 NSTEMI (non-ST elevated myocardial infarction) ([place]) 12/17/2011  ? Hypokalemia 12/15/2011  ? SOB (shortness of breath) 12/14/2011  ? CAD (coronary artery disease) 12/14/2011  ? HTN (hypertension) 12/14/2011  ? Hyperlipidemia 12/14/2011  ? GERD (gastroesophageal reflux disease) 12/14/2011  ? BPH (benign prostatic hyperplasia) 12/14/2011  ? 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 Aneurysm of thoracic aorta ([place]) 12/14/2011  ? ? ?Orientation RESPIRATION BLADDER Height & Weight   ?  ?Self, Time, Situation, Place ? O2 Continent Weight: 194 lb 10.7 oz (88.3 kg) ?Height:  '5\' 11"'$  (180.3 cm)  ?BEHAVIORAL SYMPTOMS/MOOD NEUROLOGICAL BOWEL NUTRITION STATUS  ?    Continent Diet (clear liquid)  ?AMBULATORY STATUS COMMUNICATION OF NEEDS Skin   ?Limited Assist Verbally Bruising ?  ?  ?  ?    ?     ?     ? ? ?Personal Care Assistance Level of Assistance  ?Bathing, Feeding, Dressing Bathing Assistance: Limited assistance ?Feeding assistance: Limited assistance ?Dressing Assistance: Limited assistance ?   ? ?Functional Limitations Info  ?    ?  ?   ? ? ?SPECIAL CARE FACTORS FREQUENCY  ?PT (By licensed PT),  OT (By licensed OT)   ?  ?PT Frequency: 5 times per week ?OT Frequency: 5 times per week ?  ?  ?  ?   ? ? ?Contractures    ? ? ?Additional Factors Info  ?Code Status, Allergies Code Status Info: full ?Allergies Info: iodinated contrast media, iodinated glycerol ?  ?  ?  ?    ? ?Current Medications (08/01/2021):  This is the current hospital active medication list ?Current Facility-Administered Medications  ?Medication Dose Route Frequency Provider Last Rate Last Admin  ? 0.9 %  sodium chloride infusion  10 mL/hr Intravenous Once Duffy Bruce, MD   Held at 07/28/21 0915  ? 0.9 %  sodium chloride infusion  250 mL Intravenous PRN Agbata, Tochukwu, MD      ? acetaminophen (TYLENOL) tablet 650 mg  650 mg Oral Q6H PRN Agbata, Tochukwu, MD      ? Or  ? acetaminophen (TYLENOL) suppository 650 mg  650 mg Rectal Q6H PRN Agbata, Tochukwu, MD      ? albuterol (PROVENTIL) (2.5 MG/3ML) 0.083% nebulizer solution 2.5 mg  2.5 mg Nebulization Q2H PRN Agbata, Tochukwu, MD      ? ALPRAZolam Duanne Moron) tablet 0.25 mg  0.25 mg Oral BID PRN Foust, Katy L, NP   0.25 mg at 07/31/21 0700  ? atorvastatin (LIPITOR) tablet 40 mg  40 mg Oral q1800 Agbata, Tochukwu, MD   40 mg at 07/31/21 1732  ? calcitRIOL (ROCALTROL) capsule 0.25 mcg  0.25 mcg Oral Daily Agbata, Tochukwu, MD   0.25 mcg at 07/31/21 1011  ? carvedilol (COREG) tablet 6.25 mg  6.25 mg Oral BID WC Agbata, Tochukwu, MD   6.25 mg at 07/31/21 1731  ? Chlorhexidine Gluconate Cloth 2 % PADS 6 each  6 each Topical Q0600 Colon Flattery, NP   6 each at 08/01/21 6599  ? citalopram (CELEXA) tablet 20 mg  20 mg Oral Daily Agbata, Tochukwu, MD   20 mg at 07/31/21 0956  ? epoetin alfa (EPOGEN) injection 8,000 Units  8,000 Units Intravenous Q M,W,F-HD Murlean Iba, MD   8,000 Units at 07/30/21 1316  ? finasteride (PROSCAR) tablet 5 mg  5 mg Oral Daily Agbata, Tochukwu, MD   5 mg at 07/31/21 0956  ? hydrALAZINE (APRESOLINE) tablet 100 mg  100 mg Oral Q8H Griffith, Kelly A, DO   100 mg at 08/01/21 3570  ? HYDROmorphone (DILAUDID) injection 0.5 mg  0.5 mg Intravenous Once Agbata, Tochukwu, MD      ? isosorbide mononitrate (IMDUR) 24 hr tablet 30 mg  30 mg Oral Daily Agbata, Tochukwu, MD   30 mg at 07/31/21 0955  ? loratadine (CLARITIN) tablet 10 mg  10 mg Oral  Daily Agbata, Tochukwu, MD   10 mg at 07/31/21 0955  ? MEDLINE mouth rinse  15 mL Mouth Rinse BID Agbata, Tochukwu, MD   15 mL at 07/31/21 2256  ? mometasone-formoterol (DULERA) 100-5 MCG/ACT inhaler 2 puff  2 puff Inhalation BID Agbata, Tochukwu, MD   2 puff at 07/31/21 2252  ? ondansetron (ZOFRAN) tablet 4 mg  4 mg Oral Q6H PRN Agbata, Tochukwu, MD      ? Or  ? ondansetron (ZOFRAN) injection 4 mg  4 mg Intravenous Q6H PRN Agbata, Tochukwu, MD      ? pantoprazole (PROTONIX) injection 40 mg  40 mg Intravenous Q24H Agbata, Tochukwu, MD   40 mg at 07/31/21 0955  ? sodium chloride flush (NS) 0.9 % injection 3 mL  3 mL Intravenous Q12H  Collier Bullock, MD   3 mL at 07/31/21 2257  ? sodium chloride flush (NS) 0.9 % injection 3 mL  3 mL Intravenous PRN Agbata, Tochukwu, MD      ? tamsulosin (FLOMAX) capsule 0.4 mg  0.4 mg Oral Daily Agbata, Tochukwu, MD   0.4 mg at 07/31/21 0956  ? umeclidinium bromide (INCRUSE ELLIPTA) 62.5 MCG/ACT 1 puff  1 puff Inhalation Daily Agbata, Tochukwu, MD   1 puff at 07/31/21 0956  ? vitamin B-12 (CYANOCOBALAMIN) tablet 1,000 mcg  1,000 mcg Oral Daily Agbata, Tochukwu, MD   1,000 mcg at 07/31/21 0956  ? ? ? ?Discharge Medications: ?Please see discharge summary for a list of discharge medications. ? ?Relevant Imaging Results: ? ?Relevant Lab Results: ? ? ?Additional Information ?SS #: [phone]. HD Patient ? ?Kathyleen Radice E Ka Bench, LCSW ? ? ? ? ?

## 2021-08-01 NOTE — Progress Notes (Signed)
?Progress Note ? ? ?Patient: Peter [name] UMP:[id number] DOB: 1941-01-20 DOA: 07/28/2021     4 ?DOS: the patient was seen and examined on 08/01/2021 ?  ?Brief hospital course: ? 81 y.o. male with medical history significant for ischemic cardiomyopathy with last known LVEF of 40 to 40%, chronic systolic heart failure, end-stage renal disease on hemodialysis (M/W/F), COPD with chronic respiratory failure on 4 L of oxygen, coronary artery disease, hypertension admitted for worsening shortness of breath, dark stools and anemia.  He received 1 unit pRBC for Hbg 6.7 on admission. ? ?Cardiology consulted for new onset A-fib and elevated troponin, acute on chronic HFrEF. ? ?4/18: 1 unit pRBC transfusion for Hbg 6.9.  GI consult, respiratory status makes sedation too high risk.   ? ?4/19: Hbg 7.6.  O2 requirement back to baseline.  Anxiety improved with low dose Xanax. ? ?4/20: Hbg improved to 8.5.  Respiratory status at baseline.  GI updated.  Has been off Plavix since discharge last month due to worsening anemia. ? ?4/21: EGD with gastric AVM's treated.  O2 desats suring procedure, therefore no colonopscopy at this time. ? ?Assessment and Plan: ?* Acute on chronic respiratory failure with hypoxia (HCC) ?Patient has an underlying history of COPD with chronic respiratory failure and is on 3-4 L of oxygen continuous at home. ?He presents for evaluation of worsening shortness of breath from his baseline associated with tachypnea and accessory muscle use ?Initially required BiPAP in the stepdown unit. ?Weaned to 2 L oxygen by nasal cannula. ?--Supplement O2 to keep sats 90-95%  ?--Bipap as needed ?--Close pulmonology follow up ? ?Symptomatic anemia ?Presents to the ER for evaluation of worsening shortness of breath from his baseline and noted to have a hemoglobin of 6.7 ?His baseline hemoglobin is around 8 ?Patient states that he has had melena stools and chart review shows he had a recent upper endoscopy done in February  which revealed gastritis.  ?--Plavix has been on hold ?--Continue IV Protonix.   ?--GI consulted, see their recommendations ?--Trend Hbg, transfuse if < 7 or actively bleeding ? 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  Due to O2 desats during procedure, colonoscopy was felt too high risk. ? ?Hemoglobin 7.9 today, stable. ? ? ?Atrial fibrillation ([place]) ?New onset A-fib, paroxysmal.  ?CHA2DS2-VASc score of 5 and ideally will require long-term anticoagulation as primary prophylaxis for an acute stroke, currently with acute anemia from blood loss. ?--Continue Coreg for rate control ?--Anticoagulation currently contraindicated due to acute blood loss ?--Cardiology input appreciated ? ?Ischemic cardiomyopathy ?Patient has a known history of ischemic cardiomyopathy with last known LVEF of 40 to 45%, 2D echocardiogram which was done 10/22. ?-- Continue carvedilol and nitrates ?-- Hold Plavix for now due to anemia ?(Patient has been off Plavix since hospital discharge 06/24/2021 due to worsening anemia.  This should be resumed when able.) ?-- Resume Plavix when anemia improves and okay'd by GI ? ?ESRD (end stage renal disease) ([place]) ?Dialysis days are M/W/F ?Dialysis per nephro while in hospital ? ?Depression ?Continue Celexa ? ?COPD (chronic obstructive pulmonary disease) with emphysema ([place]) ?Continue scheduled and as needed bronchodilator therapy.  Continue inhaled steroids. ? ?4/19-21: stable on 2 L oxygen via nasal cannula.  ? ?Chronic respiratory failure with hypoxia ([place]) ?Baseline O2 need is 3-4 L/min ? ?Elevated troponin ?Due to demand ischemia from anemia ?Unable to administer any antiplatelets for now due to symptomatic anemia ? ?Acute on chronic systolic CHF (congestive heart failure) ([place]) ?Stable.  Due to ischemic cardiomyopathy.  Initially contributed to acute on chronic respiratory failure.   Given IV Lasix on 4/17 and underwent hemodialysis.   ?Volume status improved and stable.   ? ?Recent Echo on 05/2021 --  mildly decreased LV systolic function with global hypokinesis, mild to moderate MR/TR and mildly dilated LV/LA/RA.  ? ?--Cardiology following ?--On Coreg, Imdur ?--Resume hydralazine ?--Daily weights, strict I/O's ?--Renal diet, fluid restriction ? ? ? ? ?  ? ?Subjective: Patient seen in dialysis this afternoon, had EGD this morning.  He reports overall feeling well today.  No shortness of breath, chest pain or other acute complaints at this time. ? ?Physical Exam: ?Vitals:  ? 08/01/21 1218 08/01/21 1230 08/01/21 1342 08/01/21 1354  ?BP: 134/67     ?Pulse: 64   64  ?Resp: 20   15  ?Temp: 97.6 ?F (36.4 ?C)  97.6 ?F (36.4 ?C)   ?TempSrc:   Oral   ?SpO2: 98% 98%  100%  ?Weight:   94.9 kg   ?Height:      ? ?General exam: awake, alert, no acute distress, chronically ill-appearing ?HEENT: Wearing glasses, moist mucus membranes, hearing grossly normal  ?Respiratory system: CTAB diminished bases, no wheezes, rales or rhonchi, normal respiratory effort. ?Cardiovascular system: normal S1/S2, RRR   ?Gastrointestinal system: soft, NT, ND, no HSM felt, +bowel sounds. ?Central nervous system: A&O x3. no gross focal neurologic deficits, normal speech ?Extremities: moves all, no cyanosis, normal tone ?Skin: dry, intact, normal temperature, slightly pale ?Psychiatry: normal mood, congruent affect, judgement and insight appear normal ? ? ?Data Reviewed: ? ?Notable labs: Glucose 102, BUN 43, creatinine 4.44, calcium 8.3, GFR 13, hemoglobin 7.9, platelets 127 ? ? ?EGD op note reviewed: ?Findings: ?     The esophagus was normal. ?     The examined duodenum was normal. ?     Three small angioectasias with no bleeding were found in the gastric  ?     fundus. Coagulation for bleeding prevention using argon beam at 0.5  ?     liters/minute and 20 watts was successful. ?     The cardia and gastric fundus were normal on retroflexion. ?Impression:    ?- Normal esophagus. ?- Normal examined duodenum. 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 Treated with argon beam coagulation. ?- No specimens collected. ?Recommendation: ?- Return patient to hospital ward for ongoing care. ?- Clear liquid diet today. ?- Continue present medications. ?- Likely bleding from AVM's , if has further bleeding may need colonoscopy too but presently he is unable to lay flat and desaturates into the 70's easily. ? ? ?Family Communication: None at bedside in dialysis.  Will attempt to update by phone ? ?Disposition: ?Status is: Inpatient ?Remains inpatient appropriate because: Warrants further close monitoring of anemia and advancement of diet in the next 24 to 48 hours as tolerated.  Requires SNF placement for short-term rehab prior to returning to ALF, placement pending. ? ? Planned Discharge Destination: Skilled nursing facility ? ? ? ?Time spent: 35 minutes ? ?Author: ?Ezekiel Slocumb, DO ?08/01/2021 2:40 PM ? ?For on call review [email or web address].  ?

## 2021-08-01 NOTE — Procedures (Signed)
Endotracheal Intubation: ?Patient required placement of an artificial airway secondary to acute hypoxic respiratory failure ?  ?Consent: Obtained ?  ?Hand washing performed prior to starting the procedure.  ?  ?Medications administered for sedation prior to procedure:  ?Fentanyl 100 mcg IV, 20 mg Etomidate IV, Rocuronium 90 mg IV ?  ?  ?A time out procedure was called and correct patient, name, & ID confirmed. Needed supplies and equipment were assembled and checked to include ETT, 10 ml syringe, Glidescope, Mac and Miller blades, suction, oxygen and bag mask valve, end tidal CO2 monitor.  ?  ?Patient was positioned to align the mouth and pharynx to facilitate visualization of the glottis.  ?  ?Heart rate, SpO2 and blood pressure was continuously monitored during the procedure. Pre-oxygenation was conducted prior to intubation and endotracheal tube was placed through the vocal cords into the trachea.  ?  ?  ? The artificial airway was placed under direct visualization via glidescope route using a 8 cm ETT on the first attempt. ?  ?ETT was secured at 25 cm . ?  ?Placement was confirmed by auscuitation of lungs with good breath sounds bilaterally and no stomach sounds.  Condensation was noted on endotracheal tube.   ?Pulse ox 99%  CO2 detector in place with appropriate color change.  ?  ?Complications: None .  ?  ?  ?  ?Chest radiograph ordered and pending.  ? ?Donell Beers, AGNP  ?Pulmonary/Critical Care ?Pager [phone] (please enter 7 digits) ?PCCM Consult Pager [phone] (please enter 7 digits)  ?

## 2021-08-01 NOTE — Procedures (Signed)
Central Venous Catheter Insertion Procedure Note ? ?Peter [name]  ?[id number]  ?Sep 04, 1940 ? ?Date:08/01/21  ?Time:6:50 PM  ? ?Provider Performing:Josefina Rynders Micheline Chapman  ? ?Procedure: Insertion of Non-tunneled Central Venous Catheter(36556) with US guidance (59977)  ? ?Indication(s) ?Medication administration and Difficult access ? ?Consent ?Unable to obtain consent due to emergent nature of procedure. ? ?Anesthesia ?Topical only with 1% lidocaine  ? ?Timeout ?Verified patient identification, verified procedure, site/side was marked, verified correct patient position, special equipment/implants available, medications/allergies/relevant history reviewed, required imaging and test results available. ? ?Sterile Technique ?Maximal sterile technique including full sterile barrier drape, hand hygiene, sterile gown, sterile gloves, mask, hair covering, sterile ultrasound probe cover (if used). ? ?Procedure Description ?Area of catheter insertion was cleaned with chlorhexidine and draped in sterile fashion.  With real-time ultrasound guidance a central venous catheter was placed into the right femoral vein. Nonpulsatile blood flow and easy flushing noted in all ports.  The catheter was sutured in place and sterile dressing applied. ? ?Complications/Tolerance ?None; patient tolerated the procedure well. ?Chest X-ray is ordered to verify placement for internal jugular or subclavian cannulation.   Chest x-ray is not ordered for femoral cannulation. ? ?EBL ?Minimal ? ?Specimen(s) ?None ? ?Donell Beers, AGNP  ?Pulmonary/Critical Care ?Pager [phone] (please enter 7 digits) ?PCCM Consult Pager [phone] (please enter 7 digits)  ?

## 2021-08-01 NOTE — Progress Notes (Signed)
[facility] Cardiology ? ? ? ?SUBJECTIVE: 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 The patient was found to be in new onset atrial fibrillation thus cardiology was consulted.  ? ? ?The patient is feeling better on today and reports that he continues to have dyspnea, but it has improved since yesterday. He was able to walk around the unit yesterday without any issues and did not become too dyspneic. He is currently on 2.5L of O2 via [place] with a O2 saturation of 96%. He continues to deny having any chest pain, palpitations or dizziness.  ? ? ?(07/31/2021): The patient is feeling well on today and does not have any complaints at this time. He continues to be on supplemental oxygen at 3L via [place] with oxygen saturation of 100%. He is resting comfortably in bed with rails up and on the bedside monitoring system.   ? ? ?(07/30/2021): The patient is not feeling well on today due to the dyspnea and states " I feel like I have to think hard about breathing;" furthermore, he requests that his supplemental oxygen is increased at this time. He denies having any chest pain, peripheral edema or dizziness; however, he has not been out of bed since being admitted.  ? ? ?(07/29/21): The patient is feeling significantly better on today per his reports. He continues to have dyspnea, but states that it has improved overnight. He is currently on 2L of oxygen via [place] with oxygen saturations at 100%. The patient endorse intermittent palpitations which are at his baseline and denies having any worsening in the symptoms at this time. He denies having  any chest pain, peripheral edema, dizziness or syncope.  ? ? ?Vitals:  ? 08/01/21 0751 08/01/21 1045 08/01/21 1055 08/01/21 1115  ?BP: (!) 144/65 (!) 141/58 (!) 143/69 (!) 141/53  ?Pulse: 61 85    ?Resp: 15 17    ?Temp: 97.6 ?F (36.4 ?C) 98.5 ?F (36.9 ?C)    ?TempSrc:  Temporal    ?SpO2: 98% 100%    ?Weight:      ?Height:      ? ? ? ?Intake/Output Summary (Last 24 hours) at 08/01/2021 1128 ?Last data filed at 08/01/2021 1040 ?Gross per 24 hour  ?Intake 435.83 ml  ?Output --  ?Net 435.83 ml  ? ? ? ? ?PHYSICAL EXAM ? ?General: Well developed, well nourished, in no acute distress.  ?HEENT:  Normocephalic and atraumatic. PERRL ?Neck:  No JVD.  ?Lungs: Clear bilaterally to auscultation. Chest expansion symmetrical. Negative for wheezing, rales or rhonchi. Normal effort of breathing without accessory muscle use.  ?Heart: HRRR. Normal S1 and S2 without gallops  ?Abdomen: Bowel sounds are positive, abdomen soft and non-tender  ?Msk:  Normal strength and tone for age. ?Extremities: Bruising to the BUE. No edema, clubbing or cyanosis.  ?Neuro: Alert and oriented X 3. ?Psych:  Good affect, responds appropriately ? ? ?LABS: ?Basic Metabolic Panel: ?Recent Labs  ?  07/31/21 ?0617 08/01/21 ?8469  ?NA 135 136  ?K 3.4* 4.1  ?CL 100 98  ?CO2 31 31  ?GLUCOSE 96 102*  ?BUN 30* 43*  ?CREATININE 3.43* 4.44*  ?CALCIUM 7.9* 8.3*  ?  MG 1.7 2.0  ?PHOS 2.9 3.3  ? ?Liver Function Tests: ?No results for input(s): AST, ALT, ALKPHOS, BILITOT, PROT, ALBUMIN in the last 72 hours. ? ?No results for input(s): LIPASE, AMYLASE in the last 72 hours. ?CBC: ?Recent Labs  ?  07/31/21 ?0617 08/01/21 ?0238  ?WBC 7.2 6.8  ?HGB 8.5* 7.9*  ?HCT 26.3* 24.9*  ?MCV 91.0 92.6  ?PLT 133* 127*  ? ?Cardiac Enzymes: ?No results for input(s): CKTOTAL, CKMB, CKMBINDEX, TROPONINI in the last 72 hours. ?BNP: ?Invalid input(s): POCBNP ?D-Dimer: ?No results for input(s): DDIMER in the last 72 hours. ?Hemoglobin A1C: ?No results for input(s): HGBA1C in the last 72  hours. ?Fasting Lipid Panel: ?No results for input(s): CHOL, HDL, LDLCALC, TRIG, CHOLHDL, LDLDIRECT in the last 72 hours. ?Thyroid Function Tests: ?No results for input(s): TSH, T4TOTAL, T3FREE, THYROIDAB in the last 72 hours. ? ?Invalid input(s): FREET3 ?Anemia Panel: ?No results for input(s): VITAMINB12, FOLATE, FERRITIN, TIBC, IRON, RETICCTPCT in the last 72 hours. ? ? ?No results found. ? ? ?Echocardiogram: (05/29/21) ?IMPRESSIONS  ? 1. Left ventricular ejection fraction, by estimation, is 40 to 45%. The  ?left ventricle has mildly decreased function. The left ventricle  ?demonstrates global hypokinesis. The left ventricular internal cavity size  ?was mildly dilated. Left ventricular  ?diastolic parameters were normal.  ? 2. Right ventricular systolic function is normal. The right ventricular  ?size is normal.  ? 3. Left atrial size was mildly dilated.  ? 4. Right atrial size was mildly dilated.  ? 5. The mitral valve is normal in structure. Mild to moderate mitral valve  ?regurgitation.  ? 6. Tricuspid valve regurgitation is mild to moderate.  ? 7. The aortic valve is normal in structure. Aortic valve regurgitation is  ?not visualized.  ? ?TELEMETRY: SR with LBBB, frequent multifocal PVC's and HR of 78 bpm ? ?ASSESSMENT AND PLAN: ? ?Principal Problem: ?  Acute on chronic respiratory failure with hypoxia ([place]) ?Active Problems: ?  Acute on chronic systolic CHF (congestive heart failure) ([place]) ?  Ischemic cardiomyopathy ?  Elevated troponin ?  Chronic respiratory failure with hypoxia (H[facility]) ?  Symptomatic anemia ?  Atrial fibrillation ([place]) ?  ESRD (end stage renal disease) ([place]) ?  COPD (chronic obstructive pulmonary disease) with emphysema ([place]) ?  Depression ?  ? ?# New onset atrial fibrillation, paroxysmal ?Controlled. Rate controlled. Upon admission on 4/17, the patient's ECG was highly suspicious for atrial fibrillation with a LBBB; the HR was 75 bpm and there was no identifiable p waves. Bedside telemetry on  4/18 reveals SR with LBBB, frequent PVC's and HR at 64 bpm, no paced beats present. Telemetry on 4/20 continues to revealed SR with LBBB, frequent PVC's and a HR of 69 bpm without any paced beats.  Rhythm remains unchanged on today with a HR of 78 bpm. No episodes of atrial fibrillation overnight noted upon telemetry review.The patient's CHADS2VASc is 5.  ? - anticoagulation contraindicated at this time due to symptomatic anemia. Consider restarting once H/H is back to baseline.  ? - continue rate control with coreg.  ? - continuous telemetry monitoring until discharge.  ? ?# Elevated troponin  ?# CAD s/p PCI/stent x3 (2018) ?Stable. Likely secondary to demand ischemia in the presence of acute on chronic HFrEF and acute on chronic respiratory failure.  HS Troponin relatively flat at 269>>292>>295 (previously peaked at 3324 during march 2022 admission). The patient is not experiencing any chest pain at this time and ECG does not show any acute  ischemic changes.  ? - HFrEF management as below.  ? - plavix held due to symptomatic anemia.  ? - continue coreg and atorvastatin.  ? ?# HFrEF, acute on chronic  ?Fairly stable.BNP was elevated at 3382.3 upon admission and the patient was treated with IV lasix on 4/17 and underwent dialysis which has helped to improve his fluid status. He appears euvolemic at this time, symptoms of dyspnea likely secondary to a/c respiratory failure. His most recent echocardiogram fromm 05/2021 revealed mildly decreased LV systolic function with global hypokinesis, mild to moderate MR/TR and mildly dilated LV/LA/RA.  ? - continue coreg and imdur.   ? - continue hydralazine '100mg'$  by mouth three times daily as blood pressure allows.  ? - daily weights, strict I&O's, renal diet.  ? ?#HTN ?Chronic. Stable. BP=144/65 map =86 ? - coreg as above.  ? - continue hydralazine '100mg'$  by mouth three times daily as blood pressure allows.  ? ? ?# acute on chronic respiratory failure with hypoxia ?# COPD with  emphysema  ?# symptomatic anemia ?Fairly stable at this time. Patient required bipap upon admission on 4/17, but it was discontinued this morning of 4/18. The patient is now on supplemental oxygen at 3 L via [place] an

## 2021-08-01 NOTE — Significant Event (Signed)
Rapid Response Event Note  ? ?Reason for Call : called to HD for pt experiencing resp distress ? ? ?Initial Focused Assessment: Wheezing, air hunger, accessory muscle use. ? ? ? ? ? ?Interventions: Notified Dr Arbutus Ped, moved to ICU for intubation. Dr Patsey Berthold and Donell Beers, NP took over care. ? ? ?Plan of Care: as above ? ? ? ?Event Summary: as above ? ?MD Notified: griffith [id number] ?Call Time:1715 ?Arrival Time:1715 ?End Time:1745 ? ?Jamy Whyte A, RN ?

## 2021-08-01 NOTE — Transfer of Care (Signed)
Immediate Anesthesia Transfer of Care Note ? ?Patient: Peter [name] ? ?Procedure(s) Performed: ESOPHAGOGASTRODUODENOSCOPY (EGD) WITH PROPOFOL ? ?Patient Location: PACU ? ?Anesthesia Type:General ? ?Level of Consciousness: drowsy ? ?Airway & Oxygen Therapy: Patient Spontanous Breathing and Patient connected to nasal cannula oxygen ? ?Post-op Assessment: Report given to RN and Post -op Vital signs reviewed and stable ? ?Post vital signs: Reviewed and stable ? ?Last Vitals:  ?Vitals Value Taken Time  ?BP 141/58 08/01/21 1046  ?Temp 36.9 ?C 08/01/21 1045  ?Pulse 83 08/01/21 1047  ?Resp 14 08/01/21 1047  ?SpO2 100 % 08/01/21 1047  ?Vitals shown include unvalidated device data. ? ?Last Pain:  ?Vitals:  ? 08/01/21 1045  ?TempSrc: Temporal  ?PainSc: Asleep  ?   ? ?  ? ?Complications: No notable events documented. ?

## 2021-08-01 NOTE — Progress Notes (Signed)
OT Cancellation Note ? ?Patient Details ?Name: Peter [name] ?MRN: [id number] ?DOB: 03-23-1941 ? ? ?Cancelled Treatment:    Reason Eval/Treat Not Completed: Other (comment) (pt off floor at this time, will re-attempt as able.) ? ?Shanon Payor, OTD OTR/L  ?08/01/21, 10:05 AM  ?

## 2021-08-01 NOTE — Op Note (Signed)
[facility] ?Gastroenterology ?Patient Name: Peter [name] ?Procedure Date: 08/01/2021 10:14 AM ?MRN: [id number] ?Account #: [id number] ?Date of Birth: Sep 17, 1940 ?Admit Type: Inpatient ?Age: 81 ?Room: [facility] ENDO ROOM 4 ?Gender: Male ?Note Status: Finalized ?Instrument Name: Upper Endoscope [id number] ?Procedure:             Upper GI endoscopy ?Indications:           Melena ?Providers:             [name] MD, MD ?Referring MD:          Tracie Harrier, MD (Referring MD) ?Medicines:             Monitored Anesthesia Care ?Complications:         No immediate complications. ?Procedure:             Pre-Anesthesia Assessment: ?                       - Prior to the procedure, a History and Physical was  ?                       performed, and patient medications, allergies and  ?                       sensitivities were reviewed. The patient's tolerance  ?                       of previous anesthesia was reviewed. ?                       - The risks and benefits of the procedure and the  ?                       sedation options and risks were discussed with the  ?                       patient. All questions were answered and informed  ?                       consent was obtained. ?                       - ASA Grade Assessment: III - A patient with severe  ?                       systemic disease. ?                       After obtaining informed consent, the endoscope was  ?                       passed under direct vision. Throughout the procedure,  ?                       the patient's blood pressure, pulse, and oxygen  ?                       saturations were monitored continuously. The Endoscope  ?                       was introduced through  the mouth, and advanced to the  ?                       fourth part of duodenum. The upper GI endoscopy was  ?                       performed with moderate difficulty due to the  ?                       patient's oxygen desaturation. Successful completion  ?                        of the procedure was aided by withdrawing and  ?                       reinserting the scope. The patient tolerated the  ?                       procedure. ?Findings: ?     The esophagus was normal. ?     The examined duodenum was normal. ?     Three small angioectasias with no bleeding were found in the gastric  ?     fundus. Coagulation for bleeding prevention using argon beam at 0.5  ?     liters/minute and 20 watts was successful. ?     The cardia and gastric fundus were normal on retroflexion. ?Impression:            - Normal esophagus. ?                       - Normal examined duodenum. ?                       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  ?                       Treated with argon beam coagulation. ?                       - No specimens collected. ?Recommendation:        - Return patient to hospital ward for ongoing care. ?                       - Clear liquid diet today. ?                       - Continue present medications. ?                       - Likely bleding from AVM's , if has further bleeding  ?                       may need colonoscopy too but presently he is unable to  ?                       lay flat and desaturates into the 70's easily. ?Procedure Code(s):     --- Professional --- ?                       46659, Esophagogastroduodenoscopy, flexible,  ?  transoral; with control of bleeding, any method ?Diagnosis Code(s):     --- Professional --- ?                       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                       without bleeding ?                       K92.1, Melena (includes Hematochezia) ?CPT copyright 2019 American Medical Association. All rights reserved. ?The codes documented in this report are preliminary and upon coder review may  ?be revised to meet current compliance requirements. ?[name], MD ?[name] MD, MD ?08/01/2021 10:43:06 AM ?This report has been signed electronically. ?Number of  Addenda: 0 ?Note Initiated On: 08/01/2021 10:14 AM ?Estimated Blood Loss:  Estimated blood loss: none. ?     [facility] ?

## 2021-08-01 NOTE — Progress Notes (Signed)
OT Cancellation Note ? ?Patient Details ?Name: Peter [name] ?MRN: [id number] ?DOB: 06/25/1940 ? ? ?Cancelled Treatment:    Reason Eval/Treat Not Completed: Other (comment) (Attemped to see patient after pt off floor this moring at endoscopy, pt is currently being transported off floor for dialysis. OT will re-attempt eval as able.) ? ?Shanon Payor, OTD OTR/L  ?08/01/21, 1:18 PM  ?

## 2021-08-01 NOTE — Progress Notes (Signed)
?[facility]  ?ROUNDING NOTE  ? ?Subjective:  ? ?Peter [name] is a 81 year old male with past medical conditions including chronic systolic heart failure with EF 40 to 45%, chronic respiratory failure on 4 L nasal cannula, COPD, hypertension, CAD, and end-stage renal disease on hemodialysis.  Patient presents to the emergency department with complaints of shortness of breath progressively worse since yesterday.  Patient has been admitted for Acute respiratory failure ([place]) [J96.00] ?ESRD (end stage renal disease) ([place]) [N18.6] ?Symptomatic anemia [D64.9] ?Acute on chronic respiratory failure with hypoxemia ([place]) [J96.21] ? ?Patient is known to our practice and receives outpatient dialysis at [facility] on a MWF schedule, supervised by Dr. Holley Raring.   ? ?Patient seen and evaluated during dialysis ?  ?HEMODIALYSIS FLOWSHEET: ? ?Blood Flow Rate (mL/min): 400 mL/min ?Arterial Pressure (mmHg): -150 mmHg ?Venous Pressure (mmHg): 140 mmHg ?Transmembrane Pressure (mmHg): 50 mmHg ?Ultrafiltration Rate (mL/min): 170 mL/min ?Dialysate Flow Rate (mL/min): 500 ml/min ?Conductivity: Machine : 13.9 ?Conductivity: Machine : 13.9 ?Dialysis Fluid Bolus: Normal Saline ?Bolus Amount (mL): 250 mL ? ?No complaints at this time ?EGD this morning ? ? ?Objective:  ?Vital signs in last 24 hours:  ?Temp:  [97.6 ?F (36.4 ?C)-98.5 ?F (36.9 ?C)] 97.6 ?F (36.4 ?C) (04/21 1342) ?Pulse Rate:  [53-85] 64 (04/21 1354) ?Resp:  [15-20] 15 (04/21 1354) ?BP: (121-145)/(52-103) 134/67 (04/21 1218) ?SpO2:  [95 %-100 %] 100 % (04/21 1354) ?Weight:  [94.9 kg] 94.9 kg (04/21 1342) ? ?Weight change:  ?Filed Weights  ? 07/30/21 1255 07/30/21 1619 08/01/21 1342  ?Weight: 89 kg 88.3 kg 94.9 kg  ? ? ?Intake/Output: ?I/O last 3 completed shifts: ?In: 525.8 [P.O.:480; IV Piggyback:45.8] ?Out: 150 [Urine:150] ?  ?Intake/Output this shift: ? Total I/O ?In: 150 [I.V.:150] ?Out: -  ? ?Physical Exam: ?General: NAD, anxious, restless  ?Head:  Normocephalic, atraumatic. Moist oral mucosal membranes  ?Eyes: Anicteric  ?Lungs:  Normal effort, clear to auscultation, [place] O2  ?Heart: Irregular rhythm  ?Abdomen:  Soft, nontender, nondistended  ?Extremities: Trace peripheral edema.  ?Neurologic: Nonfocal, moving all four extremities  ?Skin: No lesions  ?Access: Right PermCath  ? ? ?Basic Metabolic Panel: ?Recent Labs  ?Lab 07/28/21 ?1549 07/29/21 ?0867 07/30/21 ?6195 07/31/21 ?0617 08/01/21 ?0932  ?NA 137 136 135 135 136  ?K 4.6 3.9 3.9 3.4* 4.1  ?CL 102 100 99 100 98  ?CO2 '25 28 27 31 31  '$ ?GLUCOSE 155* 120* 104* 96 102*  ?BUN 44* 27* 44* 30* 43*  ?CREATININE 5.54* 3.41* 4.69* 3.43* 4.44*  ?CALCIUM 8.7* 8.2* 8.2* 7.9* 8.3*  ?MG  --  1.6* 1.8 1.7 2.0  ?PHOS 5.2* 4.2  --  2.9 3.3  ? ? ? ?Liver Function Tests: ?Recent Labs  ?Lab 07/28/21 ?0824 07/28/21 ?1549  ?AST 19  --   ?ALT 18  --   ?ALKPHOS 77  --   ?BILITOT 0.8  --   ?PROT 5.6*  --   ?ALBUMIN 3.1* 3.1*  ? ? ?No results for input(s): LIPASE, AMYLASE in the last 168 hours. ?No results for input(s): AMMONIA in the last 168 hours. ? ?CBC: ?Recent Labs  ?Lab 07/28/21 ?0824 07/28/21 ?1549 07/28/21 ?2111 07/29/21 ?0439 07/29/21 ?0909 07/29/21 ?1513 07/30/21 ?0316 07/30/21 ?1650 07/31/21 ?0617 08/01/21 ?6712  ?WBC 8.2 8.6  --  8.8  --   --  9.5  --  7.2 6.8  ?NEUTROABS 5.6  --   --   --   --   --   --   --   --   --   ?  HGB 6.7* 6.8*   < > 6.8*   < > 7.5* 7.6* 7.9* 8.5* 7.9*  ?HCT 21.7* 22.2*   < > 21.1*   < > 23.2* 24.0* 24.5* 26.3* 24.9*  ?MCV 96.0 95.7  --  92.1  --   --  93.4  --  91.0 92.6  ?PLT 161 151  --  139*  --   --  131*  --  133* 127*  ? < > = values in this interval not displayed.  ? ? ? ?Cardiac Enzymes: ?No results for input(s): CKTOTAL, CKMB, CKMBINDEX, TROPONINI in the last 168 hours. ? ?BNP: ?Invalid input(s): POCBNP ? ?CBG: ?Recent Labs  ?Lab 07/28/21 ?1116  ?GLUCAP 174*  ? ? ? ?Microbiology: ?Results for orders placed or performed during the hospital encounter of 07/28/21  ?Resp Panel by RT-PCR  (Flu A&B, Covid) Nasopharyngeal Swab     Status: None  ? Collection Time: 07/28/21  8:24 AM  ? Specimen: Nasopharyngeal Swab; Nasopharyngeal(NP) swabs in vial transport medium  ?Result Value Ref Range Status  ? SARS Coronavirus 2 by RT PCR NEGATIVE NEGATIVE Final  ?  Comment: (NOTE) ?SARS-CoV-2 target nucleic acids are NOT DETECTED. ? ?The SARS-CoV-2 RNA is generally detectable in upper respiratory ?specimens during the acute phase of infection. The lowest ?concentration of SARS-CoV-2 viral copies this assay can detect is ?138 copies/mL. A negative result does not preclude SARS-Cov-2 ?infection and should not be used as the sole basis for treatment or ?other patient management decisions. A negative result may occur with  ?improper specimen collection/handling, submission of specimen other ?than nasopharyngeal swab, presence of viral mutation(s) within the ?areas targeted by this assay, and inadequate number of viral ?copies(<138 copies/mL). A negative result must be combined with ?clinical observations, patient history, and epidemiological ?information. The expected result is Negative. ? ?Fact Sheet for Patients:  ?EntrepreneurPulse.com.au ? ?Fact Sheet for Healthcare Providers:  ?IncredibleEmployment.be ? ?This test is no t yet approved or cleared by the [place] FDA and  ?has been authorized for detection and/or diagnosis of SARS-CoV-2 by ?FDA under an Emergency Use Authorization (EUA). This EUA will remain  ?in effect (meaning this test can be used) for the duration of the ?COVID-19 declaration under Section 564(b)(1) of the Act, 21 ?U.S.C.section 360bbb-3(b)(1), unless the authorization is terminated  ?or revoked sooner.  ? ? ?  ? Influenza A by PCR NEGATIVE NEGATIVE Final  ? Influenza B by PCR NEGATIVE NEGATIVE Final  ?  Comment: (NOTE) ?The Xpert Xpress SARS-CoV-2/FLU/RSV plus assay is intended as an aid ?in the diagnosis of influenza from Nasopharyngeal swab specimens  and ?should not be used as a sole basis for treatment. Nasal washings and ?aspirates are unacceptable for Xpert Xpress SARS-CoV-2/FLU/RSV ?testing. ? ?Fact Sheet for Patients: ?EntrepreneurPulse.com.au ? ?Fact Sheet for Healthcare Providers: ?IncredibleEmployment.be ? ?This test is not yet approved or cleared by the [place] FDA and ?has been authorized for detection and/or diagnosis of SARS-CoV-2 by ?FDA under an Emergency Use Authorization (EUA). This EUA will remain ?in effect (meaning this test can be used) for the duration of the ?COVID-19 declaration under Section 564(b)(1) of the Act, 21 U.S.C. ?section 360bbb-3(b)(1), unless the authorization is terminated or ?revoked. ? ?Performed at [facility], [place], ?[place] [address] ?  ?MRSA Next Gen by PCR, Nasal     Status: None  ? Collection Time: 07/30/21 12:21 PM  ? Specimen: Nasal Mucosa; Nasal Swab  ?Result Value Ref Range Status  ? MRSA  by PCR Next Gen NOT DETECTED NOT DETECTED Final  ?  Comment: (NOTE) ?The GeneXpert MRSA Assay (FDA approved for NASAL specimens only), ?is one component of a comprehensive MRSA colonization surveillance ?program. It is not intended to diagnose MRSA infection nor to guide ?or monitor treatment for MRSA infections. ?Test performance is not FDA approved in patients less than 2 years ?old. ?Performed at [facility], [place], ?[place] [address] ?  ? ? ?Coagulation Studies: ?No results for input(s): LABPROT, INR in the last 72 hours. ? ? ?Urinalysis: ?No results for input(s): COLORURINE, LABSPEC, [place], GLUCOSEU, HGBUR, BILIRUBINUR, KETONESUR, PROTEINUR, UROBILINOGEN, NITRITE, LEUKOCYTESUR in the last 72 hours. ? ?Invalid input(s): APPERANCEUR  ? ? ?Imaging: ?No results found. ? ? ?Medications:  ? ? sodium chloride Stopped (07/28/21 0915)  ? sodium chloride    ? ? atorvastatin  40 mg Oral q1800  ? calcitRIOL  0.25 mcg Oral Daily  ?  carvedilol  6.25 mg Oral BID WC  ? Chlorhexidine Gluconate Cloth  6 each Topical Q0600  ? citalopram  20 mg Oral Daily  ? epoetin (EPOGEN/PROCRIT) injection  8,000 Units Intravenous Q M,W,F-HD  ? finasteride  5 mg [place]

## 2021-08-01 NOTE — TOC Progression Note (Addendum)
Transition of Care (TOC) - Progression Note  ? ? ?Patient Details  ?Name: Peter [name] ?MRN: [id number] ?Date of Birth: Jan 13, 1941 ? ?Transition of Care (TOC) CM/SW Contact  ?Aldon Hengst E Jullia Mulligan, LCSW ?Phone Number: ?08/01/2021, 1:05 PM ? ?Clinical Narrative:   Spoke to patient regarding PT rec for SNF before returning to ALF. Patient requested CSW call his significant other Johnny Bridge at [phone]. CSW called Opal Sidles. She stated she is agreeable to patient going to SNF, but her only preference locally is [facility]. Opal Sidles stated she would not be agreeable to patient going anywhere locally. Explained patient would need SNF before going back to ALF. CSW explained that [facility] is currently full per Seth Bake. She stated she is going to call patient's brother in [place] to see if there are any SNFs in [place] that they may be agreeable to. CSW asked Seth Bake at [facility] when she thinks they would have beds per Jane's request.  ? ?1:15- Return call from [place]. She stated they would be agreeable to [facility] or [facility]. Informed her [facility] is ALF only and [facility] does not take patients from outside of their ALF. Informed her per Seth Bake at [facility], at this point there would be no beds even next week.  ?She asked about the local options again. Provided Medicare.gov ratings. She said they may be ok with [facility] based on the Medicare ratings, she wants to talk to patient about it. Sent referral to [facility] per her request.   ? ? ? ? ?Expected Discharge Plan: Assisted Living ?Barriers to Discharge: Continued Medical Work up ? ?Expected Discharge Plan and Services ?Expected Discharge Plan: Assisted Living ?  ?Discharge Planning Services: CM Consult ?  ?Living arrangements for the past 2 months: [place] ?                ?  ?  ?  ?  ?  ?  ?  ?  ?  ?  ? ? ?Social Determinants of Health (SDOH) Interventions ?  ? ?Readmission Risk Interventions ? ?  06/11/2021  ? 12:25 PM 04/28/2021  ?  12:10 PM  ?Readmission Risk Prevention Plan  ?Transportation Screening Complete Complete  ?PCP or Specialist Appt within 3-5 Days  Complete  ?[place] or Home Care Consult  Complete  ?Social Work Consult for [place] Planning/Counseling  Complete  ?Palliative Care Screening  Not Applicable  ?Medication Review Press photographer) Complete Complete  ?PCP or Specialist appointment within 3-5 days of discharge Complete   ?[place] or Home Care Consult Complete   ?SW Recovery Care/Counseling Consult Complete   ?Palliative Care Screening Not Applicable   ?[place] Not Applicable   ? ? ?

## 2021-08-01 NOTE — Progress Notes (Signed)
Hemodialysis Post Treatment Note: ?  ?Treatment date: 08/01/2021 ?  ?Treatment time:  2.5 hours  ?  ?Access:  CVC ?  ?UF Removed: -41 ml ?  ?Next Treatment: 08/04/21 ?  ?Patient started dialysis as ordered. Toward 35 minutes left of treatment, patient to experience tachycardia and low 02 saturation. Rapid Response nurse was called to assess patient, patient was put on nonrebreather mask. Dr. Candiss Norse notified of patient change in condition, received order to terminate treatment. Patient transferred to ICU 6 for further treatment. Report given to floor nurse [place]. Tiana Loft, RN. ?

## 2021-08-01 NOTE — Plan of Care (Signed)

## 2021-08-02 ENCOUNTER — Inpatient Hospital Stay
Admit: 2021-08-02 | Discharge: 2021-08-02 | Disposition: A | Discharge status: Home / Self Care | Payer: PPO | Attending: Critical Care Medicine | Admitting: Critical Care Medicine

## 2021-08-02 DIAGNOSIS — J9621 Acute and chronic respiratory failure with hypoxia: Secondary | ICD-10-CM | POA: Diagnosis not present

## 2021-08-02 LAB — ECHOCARDIOGRAM COMPLETE
AR max vel: 1.78 cm2
AV Area VTI: 2.28 cm2
AV Area mean vel: 1.98 cm2
AV Mean grad: 10 mmHg
AV Peak grad: 21.3 mmHg
Ao pk vel: 2.31 m/s
Area-P 1/2: 3.5 cm2
Calc EF: 54.8 %
Height: 71 in
MV M vel: 6.88 m/s
MV Peak grad: 189.3 mmHg
P 1/2 time: 590 msec
Radius: 0.5 cm
S' Lateral: 4.76 cm
Single Plane A2C EF: 53.8 %
Single Plane A4C EF: 54.8 %
Weight: 3513.25 oz

## 2021-08-02 LAB — BASIC METABOLIC PANEL
Anion gap: 5 (ref 5–15)
BUN: 28 mg/dL — ABNORMAL HIGH (ref 8–23)
CO2: 33 mmol/L — ABNORMAL HIGH (ref 22–32)
Calcium: 8.1 mg/dL — ABNORMAL LOW (ref 8.9–10.3)
Chloride: 98 mmol/L (ref 98–111)
Creatinine, Ser: 3.45 mg/dL — ABNORMAL HIGH (ref 0.61–1.24)
GFR, Estimated: 17 mL/min — ABNORMAL LOW (ref >60)
Glucose, Bld: 81 mg/dL (ref 70–99)
Potassium: 4.3 mmol/L (ref 3.5–5.1)
Sodium: 136 mmol/L (ref 135–145)

## 2021-08-02 LAB — CBC
HCT: 26.8 % — ABNORMAL LOW (ref 39.0–52.0)
Hemoglobin: 8.3 g/dL — ABNORMAL LOW (ref 13.0–17.0)
MCH: 29.7 pg (ref 26.0–34.0)
MCHC: 31 g/dL (ref 30.0–36.0)
MCV: 96.1 fL (ref 80.0–100.0)
Platelets: 141 10*3/uL — ABNORMAL LOW (ref 150–400)
RBC: 2.79 MIL/uL — ABNORMAL LOW (ref 4.22–5.81)
RDW: 15.5 % (ref 11.5–15.5)
WBC: 7.2 10*3/uL (ref 4.0–10.5)
nRBC: 0 % (ref 0.0–0.2)

## 2021-08-02 LAB — GLUCOSE, CAPILLARY
Glucose-Capillary: 69 mg/dL — ABNORMAL LOW (ref 70–99)
Glucose-Capillary: 78 mg/dL (ref 70–99)
Glucose-Capillary: 92 mg/dL (ref 70–99)

## 2021-08-02 LAB — PHOSPHORUS: Phosphorus: 3.5 mg/dL (ref 2.5–4.6)

## 2021-08-02 LAB — MAGNESIUM: Magnesium: 1.9 mg/dL (ref 1.7–2.4)

## 2021-08-02 MED ORDER — CHLORHEXIDINE GLUCONATE 0.12% ORAL RINSE (MEDLINE KIT)
15.0000 mL | Freq: Two times a day (BID) | OROMUCOSAL | Status: DC
  Administered 2021-08-02 – 2021-08-03 (×3): 15 mL via OROMUCOSAL

## 2021-08-02 MED ORDER — ORAL CARE MOUTH RINSE
15.0000 mL | OROMUCOSAL | Status: DC
  Administered 2021-08-02 – 2021-08-04 (×20): 15 mL via OROMUCOSAL

## 2021-08-02 MED ORDER — HEPARIN SODIUM (PORCINE) 1000 UNIT/ML IJ SOLN
INTRAMUSCULAR | Status: AC
  Filled 2021-08-02: qty 10

## 2021-08-02 MED ORDER — DEXTROSE 50 % IV SOLN
25.0000 mL | Freq: Once | INTRAVENOUS | Status: AC
  Administered 2021-08-03: 25 mL via INTRAVENOUS
  Filled 2021-08-02: qty 50

## 2021-08-02 NOTE — Progress Notes (Signed)
?   08/02/21 1200  ?Clinical Encounter Type  ?Visited With Patient and family together  ?Visit Type Follow-up  ?Consult/Referral To Chaplain  ? ?Chaplain provided follow up care to patient and visitor. ?

## 2021-08-02 NOTE — Progress Notes (Signed)
?[facility]  ?ROUNDING NOTE  ? ?Subjective:  ? ?Peter [name] is a 81 year old male with past medical conditions including chronic systolic heart failure with EF 40 to 45%, chronic respiratory failure on 4 L nasal cannula, COPD, hypertension, CAD, and end-stage renal disease on hemodialysis.  Patient presents to the emergency department with complaints of shortness of breath progressively worse since yesterday.  Patient has been admitted for Acute respiratory failure ([place]) [J96.00] ?ESRD (end stage renal disease) ([place]) [N18.6] ?Symptomatic anemia [D64.9] ?Acute on chronic respiratory failure with hypoxemia ([place]) [J96.21] ? ?Patient is known to our practice and receives outpatient dialysis at [facility] on a MWF schedule, supervised by Dr. Candiss Norse.  ? ?Update: ?Patient intubated, on the vent.   ?We are planning for additional HD today.  ? ? ?Objective:  ?Vital signs in last 24 hours:  ?Temp:  [97.5 ?F (36.4 ?C)-98.7 ?F (37.1 ?C)] 98 ?F (36.7 ?C) (04/22 0800) ?Pulse Rate:  [50-106] 72 (04/22 0800) ?Resp:  [12-47] 16 (04/22 0800) ?BP: (77-177)/(45-92) 147/62 (04/22 0800) ?SpO2:  [89 %-100 %] 96 % (04/22 0800) ?FiO2 (%):  [30 %-100 %] 30 % (04/22 0800) ?Weight:  [90.9 kg-99.6 kg] 99.6 kg (04/21 1749) ? ?Weight change:  ?Filed Weights  ? 08/01/21 1342 08/01/21 1735 08/01/21 1749  ?Weight: 94.9 kg 90.9 kg 99.6 kg  ? ? ?Intake/Output: ?I/O last 3 completed shifts: ?In: 390.4 [I.V.:390.4] ?Out: -41  ?  ?Intake/Output this shift: ? No intake/output data recorded. ? ?Physical Exam: ?General: Critically ill appearing  ?Head: Normocephalic, atraumatic. Moist oral mucosal membranes  ?Eyes: Anicteric  ?Lungs:  Normal effort, scattered rhonchi  ?Heart: Irregular rhythm  ?Abdomen:  Soft, nontender, nondistended  ?Extremities: Trace peripheral edema.  ?Neurologic: Nonfocal, moving all four extremities  ?Skin: No lesions  ?Access: Right PermCath  ? ? ?Basic Metabolic Panel: ?Recent Labs  ?Lab 07/28/21 ?1549  07/29/21 ?1497 07/30/21 ?0316 07/31/21 ?0617 08/01/21 ?0263 08/02/21 ?7858  ?NA 137 136 135 135 136 136  ?K 4.6 3.9 3.9 3.4* 4.1 4.3  ?CL 102 100 99 100 98 98  ?CO2 '25 28 27 31 31 '$ 33*  ?GLUCOSE 155* 120* 104* 96 102* 81  ?BUN 44* 27* 44* 30* 43* 28*  ?CREATININE 5.54* 3.41* 4.69* 3.43* 4.44* 3.45*  ?CALCIUM 8.7* 8.2* 8.2* 7.9* 8.3* 8.1*  ?MG  --  1.6* 1.8 1.7 2.0 1.9  ?PHOS 5.2* 4.2  --  2.9 3.3 3.5  ? ? ? ?Liver Function Tests: ?Recent Labs  ?Lab 07/28/21 ?0824 07/28/21 ?1549  ?AST 19  --   ?ALT 18  --   ?ALKPHOS 77  --   ?BILITOT 0.8  --   ?PROT 5.6*  --   ?ALBUMIN 3.1* 3.1*  ? ? ?No results for input(s): LIPASE, AMYLASE in the last 168 hours. ?No results for input(s): AMMONIA in the last 168 hours. ? ?CBC: ?Recent Labs  ?Lab 07/28/21 ?0824 07/28/21 ?1549 07/29/21 ?0439 07/29/21 ?8502 07/30/21 ?0316 07/30/21 ?1650 07/31/21 ?0617 08/01/21 ?7741 08/02/21 ?2878  ?WBC 8.2   < > 8.8  --  9.5  --  7.2 6.8 7.2  ?NEUTROABS 5.6  --   --   --   --   --   --   --   --   ?HGB 6.7*   < > 6.8*   < > 7.6* 7.9* 8.5* 7.9* 8.3*  ?HCT 21.7*   < > 21.1*   < > 24.0* 24.5* 26.3* 24.9* 26.8*  ?MCV 96.0   < >  92.1  --  93.4  --  91.0 92.6 96.1  ?PLT 161   < > 139*  --  131*  --  133* 127* 141*  ? < > = values in this interval not displayed.  ? ? ? ?Cardiac Enzymes: ?No results for input(s): CKTOTAL, CKMB, CKMBINDEX, TROPONINI in the last 168 hours. ? ?BNP: ?Invalid input(s): POCBNP ? ?CBG: ?Recent Labs  ?Lab 07/28/21 ?1116  ?GLUCAP 174*  ? ? ? ?Microbiology: ?Results for orders placed or performed during the hospital encounter of 07/28/21  ?Resp Panel by RT-PCR (Flu A&B, Covid) Nasopharyngeal Swab     Status: None  ? Collection Time: 07/28/21  8:24 AM  ? Specimen: Nasopharyngeal Swab; Nasopharyngeal(NP) swabs in vial transport medium  ?Result Value Ref Range Status  ? SARS Coronavirus 2 by RT PCR NEGATIVE NEGATIVE Final  ?  Comment: (NOTE) ?SARS-CoV-2 target nucleic acids are NOT DETECTED. ? ?The SARS-CoV-2 RNA is generally detectable in  upper respiratory ?specimens during the acute phase of infection. The lowest ?concentration of SARS-CoV-2 viral copies this assay can detect is ?138 copies/mL. A negative result does not preclude SARS-Cov-2 ?infection and should not be used as the sole basis for treatment or ?other patient management decisions. A negative result may occur with  ?improper specimen collection/handling, submission of specimen other ?than nasopharyngeal swab, presence of viral mutation(s) within the ?areas targeted by this assay, and inadequate number of viral ?copies(<138 copies/mL). A negative result must be combined with ?clinical observations, patient history, and epidemiological ?information. The expected result is Negative. ? ?Fact Sheet for Patients:  ?EntrepreneurPulse.com.au ? ?Fact Sheet for Healthcare Providers:  ?IncredibleEmployment.be ? ?This test is no t yet approved or cleared by the [place] FDA and  ?has been authorized for detection and/or diagnosis of SARS-CoV-2 by ?FDA under an Emergency Use Authorization (EUA). This EUA will remain  ?in effect (meaning this test can be used) for the duration of the ?COVID-19 declaration under Section 564(b)(1) of the Act, 21 ?U.S.C.section 360bbb-3(b)(1), unless the authorization is terminated  ?or revoked sooner.  ? ? ?  ? Influenza A by PCR NEGATIVE NEGATIVE Final  ? Influenza B by PCR NEGATIVE NEGATIVE Final  ?  Comment: (NOTE) ?The Xpert Xpress SARS-CoV-2/FLU/RSV plus assay is intended as an aid ?in the diagnosis of influenza from Nasopharyngeal swab specimens and ?should not be used as a sole basis for treatment. Nasal washings and ?aspirates are unacceptable for Xpert Xpress SARS-CoV-2/FLU/RSV ?testing. ? ?Fact Sheet for Patients: ?EntrepreneurPulse.com.au ? ?Fact Sheet for Healthcare Providers: ?IncredibleEmployment.be ? ?This test is not yet approved or cleared by the [place] FDA and ?has been  authorized for detection and/or diagnosis of SARS-CoV-2 by ?FDA under an Emergency Use Authorization (EUA). This EUA will remain ?in effect (meaning this test can be used) for the duration of the ?COVID-19 declaration under Section 564(b)(1) of the Act, 21 U.S.C. ?section 360bbb-3(b)(1), unless the authorization is terminated or ?revoked. ? ?Performed at [facility], [place], ?[place] [address] ?  ?MRSA Next Gen by PCR, Nasal     Status: None  ? Collection Time: 07/30/21 12:21 PM  ? Specimen: Nasal Mucosa; Nasal Swab  ?Result Value Ref Range Status  ? MRSA by PCR Next Gen NOT DETECTED NOT DETECTED Final  ?  Comment: (NOTE) ?The GeneXpert MRSA Assay (FDA approved for NASAL specimens only), ?is one component of a comprehensive MRSA colonization surveillance ?program. It is not intended to diagnose MRSA infection nor to guide ?or  monitor treatment for MRSA infections. ?Test performance is not FDA approved in patients less than 2 years ?old. ?Performed at [facility], [place], ?[place] [address] ?  ?Culture, Respiratory w Gram Stain     Status: None (Preliminary result)  ? Collection Time: 08/01/21  6:06 PM  ? Specimen: SPU; Respiratory  ?Result Value Ref Range Status  ? Specimen Description   Final  ?  SPUTUM ?Performed at [facility], [address], [place], [place] [address] ?  ? Special Requests CULTURE  Final  ? Gram Stain   Final  ?  ABUNDANT WBC PRESENT, PREDOMINANTLY PMN ?NO ORGANISMS SEEN ?  ? Culture   Final  ?  NO GROWTH < 24 HOURS ?Performed at [facility], [place] [address] [place], [place] [address] ?  ? Report Status PENDING  Incomplete  ? ? ?Coagulation Studies: ?No results for input(s): LABPROT, INR in the last 72 hours. ? ? ?Urinalysis: ?No results for input(s): COLORURINE, LABSPEC, [place], GLUCOSEU, HGBUR, BILIRUBINUR, KETONESUR, PROTEINUR, UROBILINOGEN, NITRITE, LEUKOCYTESUR in the last 72 hours. ? ?Invalid input(s): APPERANCEUR   ? ? ?Imaging: ?DG Abd 1 View ? ?Result Date: 08/01/2021 ?CLINICAL DATA:  Enteric tube placement EXAM: ABDOMEN - 1 VIEW COMPARISON:  Upper GI done on 05/30/2021 FINDINGS: Tip of enteric tube is seen in

## 2021-08-02 NOTE — Progress Notes (Signed)
OT Cancellation Note ? ?Patient Details ?Name: KHOURY SIEMON ?MRN: [id number] ?DOB: [birth year] ? ? ?Cancelled Treatment:    Reason Eval/Treat Not Completed: Other (comment) (pt with rapid reponse after HD yesterday, now intubated in CCU. OT will hold this morning, will re-attempt as able.) ? ?Shanon Payor, OTD OTR/L  ?08/02/21, 8:09 AM  ?

## 2021-08-02 NOTE — Progress Notes (Signed)
[facility] ? ? ? ?SUBJECTIVE: Initially had episode of vent support requiring extubation after respiratory failure discussion with critical care team involved extubation and respiratory support.  Patient denies any chest pain still has some shortness of breath hoarse from intubation tube still resting in bed no fever ? ? ?Vitals:  ? 08/02/21 1230 08/02/21 1300 08/02/21 1330 08/02/21 1335  ?BP: (!) 151/68 (!) 145/65 (!) 148/86   ?Pulse: 74 67 75   ?Resp: '16 14 18   '$ ?Temp:  98.2 ?F (36.8 ?C)    ?TempSrc:  Oral    ?SpO2: 98% 97% 95% 93%  ?Weight:      ?Height:      ? ? ? ?Intake/Output Summary (Last 24 hours) at 08/02/2021 1401 ?Last data filed at 08/02/2021 0609 ?Gross per 24 hour  ?Intake 240.41 ml  ?Output -41 ml  ?Net 281.41 ml  ? ? ? ? ?PHYSICAL EXAM ? ?General: Well developed, well nourished, in no acute distress ?HEENT:  Normocephalic and atramatic ?Neck:  No JVD.  ?Lungs: Clear bilaterally to auscultation and percussion. ?Heart: HRRR . Normal S1 and S2 without gallops or murmurs.  ?Abdomen: Bowel sounds are positive, abdomen soft and non-tender  ?Msk:  Back normal, normal gait. Normal strength and tone for age. ?Extremities: No clubbing, cyanosis or edema.   ?Neuro: Alert and oriented X 3. ?Psych:  Good affect, responds appropriately ? ? ?LABS: ?Basic Metabolic Panel: ?Recent Labs  ?  08/01/21 ?0238 08/02/21 ?7353  ?NA 136 136  ?K 4.1 4.3  ?CL 98 98  ?CO2 31 33*  ?GLUCOSE 102* 81  ?BUN 43* 28*  ?CREATININE 4.44* 3.45*  ?CALCIUM 8.3* 8.1*  ?MG 2.0 1.9  ?PHOS 3.3 3.5  ? ?Liver Function Tests: ?No results for input(s): AST, ALT, ALKPHOS, BILITOT, PROT, ALBUMIN in the last 72 hours. ?No results for input(s): LIPASE, AMYLASE in the last 72 hours. ?CBC: ?Recent Labs  ?  08/01/21 ?0238 08/02/21 ?2992  ?WBC 6.8 7.2  ?HGB 7.9* 8.3*  ?HCT 24.9* 26.8*  ?MCV 92.6 96.1  ?PLT 127* 141*  ? ?Cardiac Enzymes: ?No results for input(s): CKTOTAL, CKMB, CKMBINDEX, TROPONINI in the last 72 hours. ?BNP: ?Invalid input(s):  POCBNP ?D-Dimer: ?No results for input(s): DDIMER in the last 72 hours. ?Hemoglobin A1C: ?No results for input(s): HGBA1C in the last 72 hours. ?Fasting Lipid Panel: ?No results for input(s): CHOL, HDL, LDLCALC, TRIG, CHOLHDL, LDLDIRECT in the last 72 hours. ?Thyroid Function Tests: ?No results for input(s): TSH, T4TOTAL, T3FREE, THYROIDAB in the last 72 hours. ? ?Invalid input(s): FREET3 ?Anemia Panel: ?No results for input(s): VITAMINB12, FOLATE, FERRITIN, TIBC, IRON, RETICCTPCT in the last 72 hours. ? ?DG Abd 1 View ? ?Result Date: 08/01/2021 ?CLINICAL DATA:  Enteric tube placement EXAM: ABDOMEN - 1 VIEW COMPARISON:  Upper GI done on 05/30/2021 FINDINGS: Tip of enteric tube is seen in the region of body of stomach. There is previous endovascular stent repair of abdominal aorta. Renal artery stents are noted. Biventricular pacer leads are noted in place. Transverse diameter of heart is increased. There is increased density in the left lower lung fields obscuring the left hemidiaphragm. IMPRESSION: Tip of enteric tube is seen in the stomach. Increased density in the left lower lung fields may suggest pleural effusion and possibly underlying infiltrate. Electronically Signed   By: Elmer Picker M.D.   On: 08/01/2021 19:45  ? ?DG Chest Port 1 View ? ?Result Date: 08/01/2021 ?CLINICAL DATA:  ET and OG placement EXAM: PORTABLE CHEST 1 VIEW COMPARISON:  07/28/2021, 05/28/2021, CT 05/27/2021 FINDINGS: Endotracheal tube tip is about 5.7 cm superior to carina. Esophageal tube tip overlies the mid to proximal stomach, side-port in the region of GE junction. Right-sided central venous catheter tip over the SVC. Left-sided pacing device as before. Cardiomegaly with vascular congestion, pulmonary edema and right greater than left pleural effusions. Worsening aeration at the bases and within the right thorax. No visible pneumothorax. Partially visualized aortic stent graft. IMPRESSION: 1. Endotracheal tube tip about 5.7 cm  superior to carina. Esophageal tube side-port in the region of GE junction, consider further advancement for more optimal positioning 2. Cardiomegaly with vascular congestion, pulmonary edema and right greater than left pleural effusions with overall worsened aeration as compared with radiograph earlier today Electronically Signed   By: Donavan Foil M.D.   On: 08/01/2021 18:15  ? ?ECHOCARDIOGRAM COMPLETE ? ?Result Date: 08/02/2021 ?   ECHOCARDIOGRAM REPORT   Patient Name:   Peter [name] Date of Exam: 08/02/2021 Medical Rec #:  [id number]        Height:       71.0 in Accession #:    [id number]       Weight:       219.6 lb Date of Birth:  07-24-1940       BSA:          2.194 m? Patient Age:    81 years         BP:           104/51 mmHg Patient Gender: M                HR:           72 bpm. Exam Location:  [facility] Procedure: 2D Echo Indications:     Acute Respiratory Distress  History:         Patient has prior history of Echocardiogram examinations, most                  recent 05/29/2021.  Sonographer:     Kathlen Brunswick RDCS Referring Phys:  [id number] Teressa Lower Diagnosing Phys: Yolonda Kida MD  Sonographer Comments: Echo performed with patient supine and on artificial respirator. IMPRESSIONS  1. Mild inferior/lateral/apical hypo.  2. Left ventricular ejection fraction, by estimation, is 50 to 55%. The left ventricle has low normal function. The left ventricle demonstrates regional wall motion abnormalities (see scoring diagram/findings for description). The left ventricular internal cavity size was moderately to severely dilated. There is mild concentric left ventricular hypertrophy. Left ventricular diastolic parameters are consistent with Grade I diastolic dysfunction (impaired relaxation).  3. Right ventricular systolic function is normal. The right ventricular size is normal.  4. Left atrial size was mildly dilated.  5. Right atrial size was mildly dilated.  6. The mitral valve is normal in structure. No  evidence of mitral valve regurgitation. Moderate to severe mitral stenosis.  7. Tricuspid valve regurgitation is moderate to severe.  8. The aortic valve is grossly normal. Aortic valve regurgitation is trivial. FINDINGS  Left Ventricle: Left ventricular ejection fraction, by estimation, is 50 to 55%. The left ventricle has low normal function. The left ventricle demonstrates regional wall motion abnormalities. The left ventricular internal cavity size was moderately to severely dilated. There is mild concentric left ventricular hypertrophy. Left ventricular diastolic parameters are consistent with Grade I diastolic dysfunction (impaired relaxation). Right Ventricle: The right ventricular size is normal. No increase in right ventricular wall thickness. Right ventricular systolic function is normal.  Left Atrium: Left atrial size was mildly dilated. Right Atrium: Right atrial size was mildly dilated. Pericardium: Trivial pericardial effusion is present. Mitral Valve: The mitral valve is normal in structure. No evidence of mitral valve regurgitation. Moderate to severe mitral valve stenosis. Tricuspid Valve: The tricuspid valve is grossly normal. Tricuspid valve regurgitation is moderate to severe. Aortic Valve: The aortic valve is grossly normal. Aortic valve regurgitation is trivial. Aortic regurgitation PHT measures 590 msec. Aortic valve mean gradient measures 10.0 mmHg. Aortic valve peak gradient measures 21.3 mmHg. Aortic valve area, by VTI measures 2.28 cm?. Pulmonic Valve: The pulmonic valve was grossly normal. Pulmonic valve regurgitation is not visualized. Aorta: The ascending aorta was not well visualized. IAS/Shunts: No atrial level shunt detected by color flow Doppler. Additional Comments: Mild inferior/lateral/apical hypo. A device lead is visualized.  LEFT VENTRICLE PLAX 2D LVIDd:         6.20 cm      Diastology LVIDs:         4.76 cm      LV e' medial:    5.22 cm/s LV PW:         1.30 cm      LV E/e'  medial:  19.9 LV IVS:        1.20 cm      LV e' lateral:   7.07 cm/s LVOT diam:     2.20 cm      LV E/e' lateral: 14.7 LV SV:         111 LV SV Index:   51 LVOT Area:     3.80 cm?  LV Volumes (MOD) LV vol d, MOD

## 2021-08-02 NOTE — Progress Notes (Signed)
PT Cancellation Note ? ?Patient Details ?Name: Peter [name] ?MRN: [id number] ?DOB: [birth year] ? ? ?Cancelled Treatment:    Reason Eval/Treat Not Completed: Medical issues which prohibited therapy (pt with rapid reponse after HD yesterday, now intubated in CCU. PT will hold this morning, will re-attempt once new orders are placed and patient is stable.) ? ? ?Janna Arch, PT, DPT ? ?08/02/2021, 12:38 PM ?

## 2021-08-02 NOTE — Progress Notes (Signed)
Patient with scheduled 2.5 hr tx actual run time 2 hrs.11 min's. Pt intubated, alert responding to voice. CVC functioning, BFR maintained. B/P decreasing during of course tx.  pt responded well to NS boluses. Pt became more alert, tugging at tubing, lines, given dose of Versed causing further decrease in B/P. Treatment terminated after interventions proved not effective, patient with nineteen min's left. On call Nephrologist made aware, and agreeable to ending treatment. UF Removed 872 ml. ?

## 2021-08-02 NOTE — Progress Notes (Signed)
*  PRELIMINARY RESULTS* ?Echocardiogram ?2D Echocardiogram has been performed. ? ?Peter [name] ?08/02/2021, 9:23 AM ?

## 2021-08-02 NOTE — Progress Notes (Signed)
? ?NAME:  Peter [name], MRN:  [id number], DOB:  1940/04/27, LOS: 5 ?ADMISSION DATE:  07/28/2021, CONSULTATION DATE:  07/28/2021 ?REFERRING MD:  Dr.Agbata, CHIEF COMPLAINT:  Shortness of Breath  ? ?Brief Pt Description / Synopsis:  ?81 y.o. Male admitted with Acute on Chronic Hypoxic Respiratory Failure in the setting of Symptomatic Anemia, volume overload, atelectasis, & questionable AECOPD requiring BiPAP. ? ?History of Present Illness:  ?Peter [name] is a 81 year old male with a past medical history significant for COPD requiring 4 L of supplemental O2 chronically, ESRD on hemodialysis, HFrEF (LVEF 40 to 45%), ischemic cardiomyopathy, hypertension, coronary artery disease who presented to [facility] ED on 07/28/2021 due to progressive shortness of breath and fatigue/weakness. ? ?He reports that at baseline he is usually short of breath, however this is worsened over the last 2 to 3 days.  He reports associated wheezing and nonproductive cough.  He had his pulse ox checked which was 80% on 4 L.  He denies fever or chills, worsening of bilateral lower extremity edema, chest pain, nausea, vomiting, diarrhea, abdominal pain, palpitations..  He does report symptoms are worse when laying flat and with any form of exertion.  He reports he has been compliant and not missed any scheduled dialysis appointment, which she completed his last session on Friday 4/14.  He reported dark stools, but denied hematochezia or hematemesis. ? ?EMS gave Solu-Medrol in route to the ED.  Given his work of breathing, he was placed on BiPAP in the ED. ? ?ED Course: ?Initial vital signs: Temperature 97.6, respiratory rate 20, pulse 80, blood pressure 147/90, 96% ?Significant Labs: Glucose 109, BUN 38, creatinine 4.93, BNP 3382, high-sensitivity troponin 269, procalcitonin less than 0.1, WBC 8.2, hemoglobin 6.7, hematocrit 21.7 ?COVID-19 PCR negative ?Imaging: ?Chest x-ray>>IMPRESSION: ?1. Increased opacity at the RIGHT lung base. This could  represent ?pneumonia, aspiration, atelectasis or asymmetric pulmonary edema. ?Favor atelectasis given the elevation of the RIGHT hemidiaphragm ?suggesting associated volume loss. ?2. Chronic interstitial lung disease/fibrosis. ?3. Emphysema. ?Medications given: 80 mg IV Lasix, 0.5 IV Dilaudid ? ?Hospitalist admitting the patient to stepdown unit, PCCM asked to consult for assistance with acute on chronic hypoxic respiratory failure requiring BiPAP. ? ?Pertinent  Medical History  ?COPD requiring 4 L of oxygen chronically ?ESRD on hemodialysis ?HFrEF (LVEF 40 to 45%) ?Ischemic cardiomyopathy ?Coronary artery disease ?Hypertension ? ?Micro Data:  ?4/17: SARS-CoV-2 and influenza PCR>>negative ?4/21: Respiratory>>negative  ? ? ?Antimicrobials:  ?Cefepime 04/21>> ? ?Significant Hospital Events: ?Including procedures, antibiotic start and stop dates in addition to other pertinent events   ?4/17: Presented to ED with shortness of breath, placed on BiPAP.  Admitted by the hospitalist, PCCM consulted.  To receive 1 unit of blood with hemodialysis today due to anemia pt does take plavix ?4/18: PCCM team signed off  ?4/18: GI consulted  ?4/21: Upper endoscopy revealed normal esophagus. Normal examined duodenum. 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 Treated with argon beam coagulation. No specimens collected. ?4/21: Rapid response called during hemodialysis pt developed severe hypoxic respiratory failure minimally responsive requiring transfer to ICU for emergent mechanical intubation  ?4/22: Will perform SBT with plans for possible extubation following hemodialysis with fluid removal  ? ?Interim History / Subjective:  ?Pt following commands and opening eyes spontaneously.  Vent settings: FiO2 30% PEEP 5.  No acute events overnight   ? ?Objective   ?Blood pressure (!) 147/62, pulse 72, temperature 98 ?F (36.7 ?C), temperature source Axillary, resp. rate 16, height '5\' 11"'$  (1.803 m), weight 99.6 kg,  SpO2 96 %. ?   ?Vent  Mode: PRVC ?FiO2 (%):  [30 %-100 %] 30 % ?Set Rate:  [16 bmp] 16 bmp ?Vt Set:  [500 mL] 500 mL ?PEEP:  [5 cmH20] 5 cmH20 ?Plateau Pressure:  [14 cmH20-15 cmH20] 14 cmH20  ? ?Intake/Output Summary (Last 24 hours) at 08/02/2021 0911 ?Last data filed at 08/02/2021 0609 ?Gross per 24 hour  ?Intake 390.41 ml  ?Output -41 ml  ?Net 431.41 ml  ? ?Filed Weights  ? 08/01/21 1342 08/01/21 1735 08/01/21 1749  ?Weight: 94.9 kg 90.9 kg 99.6 kg  ? ? ?Examination: ?General: Acute on chronically ill-appearing male,  sedated NAD mechanically intubated  ?HEENT: JVD present, supple  ?Lungs: Faint rhonchi throughout, even, non labored  ?Cardiovascular: Paced rhythm, no murmurs, rubs, gallops, 2+ radial/1+ distal pulses, 2+ bilateral lower extremity edema  ?Abdomen: + BS x4, obese, soft, non distended  ?Extremities: Normal tone  ?Neuro: Sedated, following commands, PERRL ?GU: Patient is oliguric at baseline ? ?Resolved Hospital Problem list   ? ? ?Assessment & Plan:  ? ?Acute on chronic hypoxic respiratory failure secondary to HCAP, pulmonary edema, and bilateral pleural effusions  ?-Mechanical ventilation via ARDS protocol, target PRVC 6 cc/kg ?-Wean PEEP and FiO2 as able to maintain O2 sats >88% ?-Goal plateau pressure less than 30, driving pressure less than 15 ?-PAD protocol, goal RASS 0 to -1: fentanyl gtt and prn versed ?-Will plan for SBT following hemodialysis and possible extubation today 04/22   ? ?Symptomatic Anemia s/p EGD 04/21 revealed three non-bleeding angioectasias in the stomach. Treated with argon beam coagulation ?-Monitor for S/Sx of bleeding ?-Trend CBC ?-SCD's for VTE Prophylaxis  ?-Transfuse for Hgb <7 ?-Continue PPI ?-GI consulted appreciate input  ? ?Mildly Elevated Troponin, suspect demand ischemia ?New onset Atrial Fibrillation~resolved  ?Acute on chronic HFrEF (LVEF 40-45% on Echo 10/22) ?PMHx: Ischemic cardiomyopathy ?-Continuous cardiac monitoring ?-Maintain MAP >65 ?-Echo results pending  ?-No  anticoagulation or antiplatelets given anemia ?-Hold outpatient antihypertensives due to soft bp readings  ?-Cardiology following, appreciate input ? ?ESRD on Hemodialysis ?-Monitor I&O's / urinary output ?-Follow BMP ?-Ensure adequate renal perfusion ?-Avoid nephrotoxic agents as able ?-Replace electrolytes as indicated ?-Nephrology consulted, appreciate input~HD today 04/22 with fluid removal ? ?Best Practice (right click and "Reselect all SmartList Selections" daily)  ? ?Diet/type: NPO ?DVT prophylaxis: SCD ?GI prophylaxis: PPI ?Lines: N/A ?Foley:  N/A ?Code Status:  Partial Code ?Last date of multidisciplinary goals of care discussion [N/A] ? ?Updated pts daughter Julian Reil via telephone regarding pt condition and current plan of care all questions were answered  ? ?Labs   ?CBC: ?Recent Labs  ?Lab 07/28/21 ?0824 07/28/21 ?1549 07/29/21 ?0439 07/29/21 ?8413 07/30/21 ?0316 07/30/21 ?1650 07/31/21 ?0617 08/01/21 ?2440 08/02/21 ?1027  ?WBC 8.2   < > 8.8  --  9.5  --  7.2 6.8 7.2  ?NEUTROABS 5.6  --   --   --   --   --   --   --   --   ?HGB 6.7*   < > 6.8*   < > 7.6* 7.9* 8.5* 7.9* 8.3*  ?HCT 21.7*   < > 21.1*   < > 24.0* 24.5* 26.3* 24.9* 26.8*  ?MCV 96.0   < > 92.1  --  93.4  --  91.0 92.6 96.1  ?PLT 161   < > 139*  --  131*  --  133* 127* 141*  ? < > = values in this interval not displayed.  ? ? ?Basic  Metabolic Panel: ?Recent Labs  ?Lab 07/28/21 ?1549 07/29/21 ?3790 07/30/21 ?0316 07/31/21 ?0617 08/01/21 ?2409 08/02/21 ?7353  ?NA 137 136 135 135 136 136  ?K 4.6 3.9 3.9 3.4* 4.1 4.3  ?CL 102 100 99 100 98 98  ?CO2 '25 28 27 31 31 '$ 33*  ?GLUCOSE 155* 120* 104* 96 102* 81  ?BUN 44* 27* 44* 30* 43* 28*  ?CREATININE 5.54* 3.41* 4.69* 3.43* 4.44* 3.45*  ?CALCIUM 8.7* 8.2* 8.2* 7.9* 8.3* 8.1*  ?MG  --  1.6* 1.8 1.7 2.0 1.9  ?PHOS 5.2* 4.2  --  2.9 3.3 3.5  ? ?GFR: ?Estimated Creatinine Clearance: 20.2 mL/min (A) (by C-G formula based on SCr of 3.45 mg/dL (H)). ?Recent Labs  ?Lab 07/28/21 ?0824 07/28/21 ?1549  07/30/21 ?0316 07/31/21 ?0617 08/01/21 ?2992 08/02/21 ?4268  ?PROCALCITON <0.10  --   --   --   --   --   ?WBC 8.2   < > 9.5 7.2 6.8 7.2  ? < > = values in this interval not displayed.  ? ? ?Liver Function Tests: ?Recent Labs  ?

## 2021-08-02 NOTE — Progress Notes (Signed)
? ? ?Peter Darby, MD ?[address]  ?[address]  ?[place], [place] [address]  ?Main: [phone]  ?Fax: [phone] ?Pager: [phone] ? ? ?Subjective: ?Patient is extubated this morning.  No witnessed episodes of melena, rectal bleeding or hematemesis.  Patient's daughter, wife and his cousin are bedside.  Patient is hard of hearing. ? ?Objective: ?Vital signs in last 24 hours: ?Vitals:  ? 08/03/21 1000 08/03/21 1045 08/03/21 1115 08/03/21 1200  ?BP: (!) 148/76 125/62 (!) 134/55 135/73  ?Pulse: 72 (!) 144 80 78  ?Resp: '18 11 15 17  '$ ?Temp:    98.8 ?F (37.1 ?C)  ?TempSrc:    Oral  ?SpO2: 97% 93% 92% 93%  ?Weight:      ?Height:      ? ?Weight change: 1.5 kg ? ?Intake/Output Summary (Last 24 hours) at 08/03/2021 1218 ?Last data filed at 08/03/2021 0900 ?Gross per 24 hour  ?Intake 636.1 ml  ?Output 872 ml  ?Net -235.9 ml  ? ? ? ?Exam: ?Heart:: Regular rate and rhythm, S1S2 present, or without murmur or extra heart sounds ?Lungs: normal and clear to auscultation ?Abdomen: soft, nontender, normal bowel sounds ? ? ?Lab Results: ? ?  Latest Ref Rng & Units 08/03/2021  ?  4:40 AM 08/02/2021  ?  5:38 AM 08/01/2021  ?  2:38 AM  ?CBC  ?WBC 4.0 - 10.5 K/uL 8.5   7.2   6.8    ?Hemoglobin 13.0 - 17.0 g/dL 8.7   8.3   7.9    ?Hematocrit 39.0 - 52.0 % 27.6   26.8   24.9    ?Platelets 150 - 400 K/uL 160   141   127    ? ? ?  Latest Ref Rng & Units 08/03/2021  ?  4:40 AM 08/02/2021  ?  5:38 AM 08/01/2021  ?  2:38 AM  ?CMP  ?Glucose 70 - 99 mg/dL 72   81   102    ?BUN 8 - 23 mg/dL 24   28   43    ?Creatinine 0.61 - 1.24 mg/dL 3.43   3.45   4.44    ?Sodium 135 - 145 mmol/L 135   136   136    ?Potassium 3.5 - 5.1 mmol/L 3.8   4.3   4.1    ?Chloride 98 - 111 mmol/L 98   98   98    ?CO2 22 - 32 mmol/L 29   33   31    ?Calcium 8.9 - 10.3 mg/dL 8.2   8.1   8.3    ? ? ?Micro Results: ?Recent Results (from the past 240 hour(s))  ?Resp Panel by RT-PCR (Flu A&B, Covid) Nasopharyngeal Swab     Status: None  ? Collection Time: 07/28/21  8:24 AM   ? Specimen: Nasopharyngeal Swab; Nasopharyngeal(NP) swabs in vial transport medium  ?Result Value Ref Range Status  ? SARS Coronavirus 2 by RT PCR NEGATIVE NEGATIVE Final  ?  Comment: (NOTE) ?SARS-CoV-2 target nucleic acids are NOT DETECTED. ? ?The SARS-CoV-2 RNA is generally detectable in upper respiratory ?specimens during the acute phase of infection. The lowest ?concentration of SARS-CoV-2 viral copies this assay can detect is ?138 copies/mL. A negative result does not preclude SARS-Cov-2 ?infection and should not be used as the sole basis for treatment or ?other patient management decisions. A negative result may occur with  ?improper specimen collection/handling, submission of specimen other ?than nasopharyngeal swab, presence of viral mutation(s) within the ?areas targeted by this  assay, and inadequate number of viral ?copies(<138 copies/mL). A negative result must be combined with ?clinical observations, patient history, and epidemiological ?information. The expected result is Negative. ? ?Fact Sheet for Patients:  ?EntrepreneurPulse.com.au ? ?Fact Sheet for Healthcare Providers:  ?IncredibleEmployment.be ? ?This test is no t yet approved or cleared by the [place] FDA and  ?has been authorized for detection and/or diagnosis of SARS-CoV-2 by ?FDA under an Emergency Use Authorization (EUA). This EUA will remain  ?in effect (meaning this test can be used) for the duration of the ?COVID-19 declaration under Section 564(b)(1) of the Act, 21 ?U.S.C.section 360bbb-3(b)(1), unless the authorization is terminated  ?or revoked sooner.  ? ? ?  ? Influenza A by PCR NEGATIVE NEGATIVE Final  ? Influenza B by PCR NEGATIVE NEGATIVE Final  ?  Comment: (NOTE) ?The Xpert Xpress SARS-CoV-2/FLU/RSV plus assay is intended as an aid ?in the diagnosis of influenza from Nasopharyngeal swab specimens and ?should not be used as a sole basis for treatment. Nasal washings and ?aspirates are  unacceptable for Xpert Xpress SARS-CoV-2/FLU/RSV ?testing. ? ?Fact Sheet for Patients: ?EntrepreneurPulse.com.au ? ?Fact Sheet for Healthcare Providers: ?IncredibleEmployment.be ? ?This test is not yet approved or cleared by the [place] FDA and ?has been authorized for detection and/or diagnosis of SARS-CoV-2 by ?FDA under an Emergency Use Authorization (EUA). This EUA will remain ?in effect (meaning this test can be used) for the duration of the ?COVID-19 declaration under Section 564(b)(1) of the Act, 21 U.S.C. ?section 360bbb-3(b)(1), unless the authorization is terminated or ?revoked. ? ?Performed at [facility], [place], ?[place] [address] ?  ?MRSA Next Gen by PCR, Nasal     Status: None  ? Collection Time: 07/30/21 12:21 PM  ? Specimen: Nasal Mucosa; Nasal Swab  ?Result Value Ref Range Status  ? MRSA by PCR Next Gen NOT DETECTED NOT DETECTED Final  ?  Comment: (NOTE) ?The GeneXpert MRSA Assay (FDA approved for NASAL specimens only), ?is one component of a comprehensive MRSA colonization surveillance ?program. It is not intended to diagnose MRSA infection nor to guide ?or monitor treatment for MRSA infections. ?Test performance is not FDA approved in patients less than 2 years ?old. ?Performed at [facility], [place], ?[place] [address] ?  ?Culture, Respiratory w Gram Stain     Status: None (Preliminary result)  ? Collection Time: 08/01/21  6:06 PM  ? Specimen: SPU; Respiratory  ?Result Value Ref Range Status  ? Specimen Description   Final  ?  SPUTUM ?Performed at [facility], [address] [place], [place] [address] ?  ? Special Requests CULTURE  Final  ? Gram Stain   Final  ?  ABUNDANT WBC PRESENT, PREDOMINANTLY PMN ?NO ORGANISMS SEEN ?  ? Culture   Final  ?  CULTURE REINCUBATED FOR BETTER GROWTH ?Performed at [facility], [place] [address], [place], [place] [address] ?  ? Report Status PENDING   Incomplete  ? ?Studies/Results: ?DG Abd 1 View ? ?Result Date: 08/01/2021 ?CLINICAL DATA:  Enteric tube placement EXAM: ABDOMEN - 1 VIEW COMPARISON:  Upper GI done on 05/30/2021 FINDINGS: Tip of enteric tube is seen in the region of body of stomach. There is previous endovascular stent repair of abdominal aorta. Renal artery stents are noted. Biventricular pacer leads are noted in place. Transverse diameter of heart is increased. There is increased density in the left lower lung fields obscuring the left hemidiaphragm. IMPRESSION: Tip of enteric tube  is seen in the stomach. Increased density in the left lower lung fields may suggest pleural effusion and possibly underlying infiltrate. Electronically Signed   By: Elmer Picker M.D.   On: 08/01/2021 19:45  ? ?DG Chest Port 1 View ? ?Result Date: 08/03/2021 ?CLINICAL DATA:  Ventilator dependent respiratory failure. EXAM: PORTABLE CHEST 1 VIEW COMPARISON:  Portable chest 08/01/2021. FINDINGS: 5:05 a.m., 08/03/2021. NGT enters the stomach with the intragastric course not included in the field. ETT tip is 6 cm from carina. Right IJ dialysis catheter tip remains at the superior cavoatrial junction level. Left chest dual lead pacing system with AID wiring is unaltered. The heart is enlarged. Stable mediastinum with aortic atherosclerosis. Central vessels are less distended today. There is mild perihilar vascular congestion, mild interstitial edema with improvement in the edema component which has largely resolved in the upper lung zones and is less pronounced in the central and lower zones. Moderate right and small left pleural effusions continue to be seen with hazy interstitial consolidation in the right-greater-than-left lower lung fields, again could represent atelectasis or consolidation. Pleural effusions are unchanged. IMPRESSION: 1. Improvement in interstitial edema and perihilar vascular congestion. 2. No interval change in right-greater-than-left pleural  effusions and lower zonal lung opacities. Electronically Signed   By: Telford Nab M.D.   On: 08/03/2021 07:23  ? ?DG Chest Port 1 View ? ?Result Date: 08/01/2021 ?CLINICAL DATA:  ET and OG placement EXAM: POR

## 2021-08-02 NOTE — Progress Notes (Deleted)
Patient walked around unit- unassisted- vitals stable- no shortness of breath or complaints of pain.  MD notified. ?

## 2021-08-02 NOTE — Progress Notes (Signed)
OT Cancellation Note ? ?Patient Details ?Name: Peter [name] ?MRN: [id number] ?DOB: 07/31/1940 ? ? ?Cancelled Treatment:    Reason Eval/Treat Not Completed: Other (comment) (per RN pt anxious, recieved medication; waiting for dialysis before attempting extubation. OT to hold this afternoon. OT will reattempt as able.) ? ?Shanon Payor, OTD OTR/L  ?08/02/21, 1:46 PM  ?

## 2021-08-03 ENCOUNTER — Inpatient Hospital Stay: Payer: PPO

## 2021-08-03 DIAGNOSIS — K31819 Angiodysplasia of stomach and duodenum without bleeding: Secondary | ICD-10-CM

## 2021-08-03 DIAGNOSIS — D649 Anemia, unspecified: Secondary | ICD-10-CM | POA: Diagnosis not present

## 2021-08-03 DIAGNOSIS — J9621 Acute and chronic respiratory failure with hypoxia: Secondary | ICD-10-CM | POA: Diagnosis not present

## 2021-08-03 LAB — RENAL FUNCTION PANEL
Albumin: 2.8 g/dL — ABNORMAL LOW (ref 3.5–5.0)
Anion gap: 8 (ref 5–15)
BUN: 24 mg/dL — ABNORMAL HIGH (ref 8–23)
CO2: 29 mmol/L (ref 22–32)
Calcium: 8.2 mg/dL — ABNORMAL LOW (ref 8.9–10.3)
Chloride: 98 mmol/L (ref 98–111)
Creatinine, Ser: 3.43 mg/dL — ABNORMAL HIGH (ref 0.61–1.24)
GFR, Estimated: 17 mL/min — ABNORMAL LOW (ref >60)
Glucose, Bld: 72 mg/dL (ref 70–99)
Phosphorus: 2.8 mg/dL (ref 2.5–4.6)
Potassium: 3.8 mmol/L (ref 3.5–5.1)
Sodium: 135 mmol/L (ref 135–145)

## 2021-08-03 LAB — CBC
HCT: 27.6 % — ABNORMAL LOW (ref 39.0–52.0)
Hemoglobin: 8.7 g/dL — ABNORMAL LOW (ref 13.0–17.0)
MCH: 29.4 pg (ref 26.0–34.0)
MCHC: 31.5 g/dL (ref 30.0–36.0)
MCV: 93.2 fL (ref 80.0–100.0)
Platelets: 160 10*3/uL (ref 150–400)
RBC: 2.96 MIL/uL — ABNORMAL LOW (ref 4.22–5.81)
RDW: 15.4 % (ref 11.5–15.5)
WBC: 8.5 10*3/uL (ref 4.0–10.5)
nRBC: 0 % (ref 0.0–0.2)

## 2021-08-03 LAB — GLUCOSE, CAPILLARY
Glucose-Capillary: 100 mg/dL — ABNORMAL HIGH (ref 70–99)
Glucose-Capillary: 66 mg/dL — ABNORMAL LOW (ref 70–99)
Glucose-Capillary: 117 mg/dL — ABNORMAL HIGH (ref 70–99)
Glucose-Capillary: 80 mg/dL (ref 70–99)

## 2021-08-03 LAB — MAGNESIUM: Magnesium: 1.6 mg/dL — ABNORMAL LOW (ref 1.7–2.4)

## 2021-08-03 MED ORDER — IPRATROPIUM-ALBUTEROL 0.5-2.5 (3) MG/3ML IN SOLN
3.0000 mL | Freq: Four times a day (QID) | RESPIRATORY_TRACT | Status: DC
  Administered 2021-08-03 – 2021-08-04 (×5): 3 mL via RESPIRATORY_TRACT
  Filled 2021-08-03 (×5): qty 3

## 2021-08-03 MED ORDER — MAGNESIUM SULFATE 2 GM/50ML IV SOLN
2.0000 g | Freq: Once | INTRAVENOUS | Status: AC
  Administered 2021-08-03: 2 g via INTRAVENOUS
  Filled 2021-08-03: qty 50

## 2021-08-03 MED ORDER — DEXTROSE 50 % IV SOLN
INTRAVENOUS | Status: AC
  Administered 2021-08-03: 12.5 g
  Administered 2021-08-04: 50 mL via INTRAVENOUS
  Filled 2021-08-03: qty 50

## 2021-08-03 MED ORDER — SODIUM CHLORIDE 0.9 % IV SOLN
300.0000 mg | Freq: Once | INTRAVENOUS | Status: AC
  Administered 2021-08-03: 300 mg via INTRAVENOUS
  Filled 2021-08-03: qty 300

## 2021-08-03 NOTE — Progress Notes (Signed)
?Peter [name]  ?ROUNDING NOTE  ? ?Subjective:  ? ?Peter [name] is a 81 year old male with past medical conditions including chronic systolic heart failure with EF 40 to 45%, chronic respiratory failure on 4 L nasal cannula, COPD, hypertension, CAD, and end-stage renal disease on hemodialysis.  Patient presents to the emergency department with complaints of shortness of breath progressively worse since yesterday.  Patient has been admitted for Acute respiratory failure ([place]) [J96.00] ?ESRD (end stage renal disease) ([place]) [N18.6] ?Symptomatic anemia [D64.9] ?Acute on chronic respiratory failure with hypoxemia ([place]) [J96.21] ? ?Patient is known to our practice and receives outpatient dialysis at [facility] on a MWF schedule, supervised by Dr. Candiss Norse.  ? ?Update: ?Patient sitting up in bed, extubated ?Daughter at bedside ?Alert but disoriented, slow processing ? ? ?Objective:  ?Vital signs in last 24 hours:  ?Temp:  [98.5 ?F (36.9 ?C)-99.5 ?F (37.5 ?C)] 98.8 ?F (37.1 ?C) (04/23 1200) ?Pulse Rate:  [42-144] 61 (04/23 1300) ?Resp:  [10-26] 17 (04/23 1300) ?BP: (82-165)/(43-86) 120/52 (04/23 1300) ?SpO2:  [89 %-100 %] 93 % (04/23 1300) ?FiO2 (%):  [30 %] 30 % (04/23 0816) ?Weight:  [96.4 kg] 96.4 kg (04/22 1713) ? ?Weight change: 1.5 kg ?Filed Weights  ? 08/01/21 1735 08/01/21 1749 08/02/21 1713  ?Weight: 90.9 kg 99.6 kg 96.4 kg  ? ? ?Intake/Output: ?I/O last 3 completed shifts: ?In: 845.6 [I.V.:595.5; IV Piggyback:250.1] ?Out: 872 [Other:872] ?  ?Intake/Output this shift: ? Total I/O ?In: 9.9 [I.V.:9.9] ?Out: -  ? ?Physical Exam: ?General: Critically ill appearing  ?Head: Normocephalic, atraumatic. Moist oral mucosal membranes  ?Eyes: Anicteric  ?Lungs:  Normal effort, scattered rhonchi  ?Heart: Irregular rhythm  ?Abdomen:  Soft, nontender, nondistended  ?Extremities: Trace peripheral edema.  ?Neurologic: Nonfocal, moving all four extremities  ?Skin: No lesions  ?Access: Right PermCath  ? ? ?Basic  Metabolic Panel: ?Recent Labs  ?Lab 07/29/21 ?7619 07/30/21 ?0316 07/31/21 ?0617 08/01/21 ?5093 08/02/21 ?2671 08/03/21 ?0440  ?NA 136 135 135 136 136 135  ?K 3.9 3.9 3.4* 4.1 4.3 3.8  ?CL 100 99 100 98 98 98  ?CO2 '28 27 31 31 '$ 33* 29  ?GLUCOSE 120* 104* 96 102* 81 72  ?BUN 27* 44* 30* 43* 28* 24*  ?CREATININE 3.41* 4.69* 3.43* 4.44* 3.45* 3.43*  ?CALCIUM 8.2* 8.2* 7.9* 8.3* 8.1* 8.2*  ?MG 1.6* 1.8 1.7 2.0 1.9 1.6*  ?PHOS 4.2  --  2.9 3.3 3.5 2.8  ? ? ? ?Liver Function Tests: ?Recent Labs  ?Lab 07/28/21 ?0824 07/28/21 ?1549 08/03/21 ?0440  ?AST 19  --   --   ?ALT 18  --   --   ?ALKPHOS 77  --   --   ?BILITOT 0.8  --   --   ?PROT 5.6*  --   --   ?ALBUMIN 3.1* 3.1* 2.8*  ? ? ?No results for input(s): LIPASE, AMYLASE in the last 168 hours. ?No results for input(s): AMMONIA in the last 168 hours. ? ?CBC: ?Recent Labs  ?Lab 07/28/21 ?0824 07/28/21 ?1549 07/30/21 ?0316 07/30/21 ?1650 07/31/21 ?0617 08/01/21 ?2458 08/02/21 ?0998 08/03/21 ?0440  ?WBC 8.2   < > 9.5  --  7.2 6.8 7.2 8.5  ?NEUTROABS 5.6  --   --   --   --   --   --   --   ?HGB 6.7*   < > 7.6* 7.9* 8.5* 7.9* 8.3* 8.7*  ?HCT 21.7*   < > 24.0* 24.5* 26.3* 24.9* 26.8* 27.6*  ?MCV  96.0   < > 93.4  --  91.0 92.6 96.1 93.2  ?PLT 161   < > 131*  --  133* 127* 141* 160  ? < > = values in this interval not displayed.  ? ? ? ?Cardiac Enzymes: ?No results for input(s): CKTOTAL, CKMB, CKMBINDEX, TROPONINI in the last 168 hours. ? ?BNP: ?Invalid input(s): POCBNP ? ?CBG: ?Recent Labs  ?Lab 08/02/21 ?1819 08/02/21 ?2351 08/03/21 ?0104 08/03/21 ?1214 08/03/21 ?1239  ?GLUCAP 78 69* 100* 66* 117*  ? ? ? ?Microbiology: ?Results for orders placed or performed during the hospital encounter of 07/28/21  ?Resp Panel by RT-PCR (Flu A&B, Covid) Nasopharyngeal Swab     Status: None  ? Collection Time: 07/28/21  8:24 AM  ? Specimen: Nasopharyngeal Swab; Nasopharyngeal(NP) swabs in vial transport medium  ?Result Value Ref Range Status  ? SARS Coronavirus 2 by RT PCR NEGATIVE NEGATIVE Final   ?  Comment: (NOTE) ?SARS-CoV-2 target nucleic acids are NOT DETECTED. ? ?The SARS-CoV-2 RNA is generally detectable in upper respiratory ?specimens during the acute phase of infection. The lowest ?concentration of SARS-CoV-2 viral copies this assay can detect is ?138 copies/mL. A negative result does not preclude SARS-Cov-2 ?infection and should not be used as the sole basis for treatment or ?other patient management decisions. A negative result may occur with  ?improper specimen collection/handling, submission of specimen other ?than nasopharyngeal swab, presence of viral mutation(s) within the ?areas targeted by this assay, and inadequate number of viral ?copies(<138 copies/mL). A negative result must be combined with ?clinical observations, patient history, and epidemiological ?information. The expected result is Negative. ? ?Fact Sheet for Patients:  ?EntrepreneurPulse.com.au ? ?Fact Sheet for Healthcare Providers:  ?IncredibleEmployment.be ? ?This test is no t yet approved or cleared by the [place] FDA and  ?has been authorized for detection and/or diagnosis of SARS-CoV-2 by ?FDA under an Emergency Use Authorization (EUA). This EUA will remain  ?in effect (meaning this test can be used) for the duration of the ?COVID-19 declaration under Section 564(b)(1) of the Act, 21 ?U.S.C.section 360bbb-3(b)(1), unless the authorization is terminated  ?or revoked sooner.  ? ? ?  ? Influenza A by PCR NEGATIVE NEGATIVE Final  ? Influenza B by PCR NEGATIVE NEGATIVE Final  ?  Comment: (NOTE) ?The Xpert Xpress SARS-CoV-2/FLU/RSV plus assay is intended as an aid ?in the diagnosis of influenza from Nasopharyngeal swab specimens and ?should not be used as a sole basis for treatment. Nasal washings and ?aspirates are unacceptable for Xpert Xpress SARS-CoV-2/FLU/RSV ?testing. ? ?Fact Sheet for Patients: ?EntrepreneurPulse.com.au ? ?Fact Sheet for Healthcare  Providers: ?IncredibleEmployment.be ? ?This test is not yet approved or cleared by the [place] FDA and ?has been authorized for detection and/or diagnosis of SARS-CoV-2 by ?FDA under an Emergency Use Authorization (EUA). This EUA will remain ?in effect (meaning this test can be used) for the duration of the ?COVID-19 declaration under Section 564(b)(1) of the Act, 21 U.S.C. ?section 360bbb-3(b)(1), unless the authorization is terminated or ?revoked. ? ?Performed at [facility], [place], ?[place] [address] ?  ?MRSA Next Gen by PCR, Nasal     Status: None  ? Collection Time: 07/30/21 12:21 PM  ? Specimen: Nasal Mucosa; Nasal Swab  ?Result Value Ref Range Status  ? MRSA by PCR Next Gen NOT DETECTED NOT DETECTED Final  ?  Comment: (NOTE) ?The GeneXpert MRSA Assay (FDA approved for NASAL specimens only), ?is one component of a comprehensive MRSA colonization surveillance ?program. It  is not intended to diagnose MRSA infection nor to guide ?or monitor treatment for MRSA infections. ?Test performance is not FDA approved in patients less than 2 years ?old. ?Performed at [facility], [place], ?[place] [address] ?  ?Culture, Respiratory w Gram Stain     Status: None (Preliminary result)  ? Collection Time: 08/01/21  6:06 PM  ? Specimen: SPU; Respiratory  ?Result Value Ref Range Status  ? Specimen Description   Final  ?  SPUTUM ?Performed at [facility], [address] [place], [place] [address] ?  ? Special Requests CULTURE  Final  ? Gram Stain   Final  ?  ABUNDANT WBC PRESENT, PREDOMINANTLY PMN ?NO ORGANISMS SEEN ?  ? Culture   Final  ?  CULTURE REINCUBATED FOR BETTER GROWTH ?Performed at [facility], [place] [address] [place], [place] [address] ?  ? Report Status PENDING  Incomplete  ? ? ?Coagulation Studies: ?No results for input(s): LABPROT, INR in the last 72 hours. ? ? ?Urinalysis: ?No results for input(s): COLORURINE,  LABSPEC, [place], GLUCOSEU, HGBUR, BILIRUBINUR, KETONESUR, PROTEINUR, UROBILINOGEN, NITRITE, LEUKOCYTESUR in the last 72 hours. ? ?Invalid input(s): APPERANCEUR  ? ? ?Imaging: ?DG Abd 1 View ? ?Result Date: 08/01/2021 ?CLIN

## 2021-08-03 NOTE — Procedures (Signed)
Extubation Procedure Note ? ?Patient Details:   ?Name: Peter [name] ?DOB: Feb 07, 1941 ?MRN: [id number] ?  ?Airway Documentation:  ?  ?Vent end date: 08/03/21 Vent end time: 0915  ? ?Evaluation ? O2 sats: stable throughout and currently acceptable ?Complications: No apparent complications ?Patient did tolerate procedure well. ?Bilateral Breath Sounds: Clear, Diminished (fine crackles) ?  ?Yes ? ?Patient was extubated to a 4L [place]. Cuff leak was heard. RN, MD, and patients daughter was at the bedside during extubation. ? ?[name] ?08/03/2021, 9:23 AM ? ?

## 2021-08-03 NOTE — Progress Notes (Addendum)
? ?NAME:  Peter [name], MRN:  [id number], DOB:  07-09-1940, LOS: 6 ?ADMISSION DATE:  07/28/2021, CONSULTATION DATE:  07/28/2021 ?REFERRING MD:  Dr.Agbata, CHIEF COMPLAINT:  Shortness of Breath  ? ?Brief Pt Description / Synopsis:  ?81 y.o. Male admitted with Acute on Chronic Hypoxic Respiratory Failure in the setting of Symptomatic Anemia, volume overload, atelectasis, & questionable AECOPD requiring BiPAP. ? ?History of Present Illness:  ?Peter [name] is a 81 year old male with a past medical history significant for COPD requiring 4 L of supplemental O2 chronically, ESRD on hemodialysis, HFrEF (LVEF 40 to 45%), ischemic cardiomyopathy, hypertension, coronary artery disease who presented to [facility] ED on 07/28/2021 due to progressive shortness of breath and fatigue/weakness. ? ?He reports that at baseline he is usually short of breath, however this is worsened over the last 2 to 3 days.  He reports associated wheezing and nonproductive cough.  He had his pulse ox checked which was 80% on 4 L.  He denies fever or chills, worsening of bilateral lower extremity edema, chest pain, nausea, vomiting, diarrhea, abdominal pain, palpitations..  He does report symptoms are worse when laying flat and with any form of exertion.  He reports he has been compliant and not missed any scheduled dialysis appointment, which she completed his last session on Friday 4/14.  He reported dark stools, but denied hematochezia or hematemesis. ? ?EMS gave Solu-Medrol in route to the ED.  Given his work of breathing, he was placed on BiPAP in the ED. ? ?ED Course: ?Initial vital signs: Temperature 97.6, respiratory rate 20, pulse 80, blood pressure 147/90, 96% ?Significant Labs: Glucose 109, BUN 38, creatinine 4.93, BNP 3382, high-sensitivity troponin 269, procalcitonin less than 0.1, WBC 8.2, hemoglobin 6.7, hematocrit 21.7 ?COVID-19 PCR negative ?Imaging: ?Chest x-ray>>IMPRESSION: ?1. Increased opacity at the RIGHT lung base. This could  represent ?pneumonia, aspiration, atelectasis or asymmetric pulmonary edema. ?Favor atelectasis given the elevation of the RIGHT hemidiaphragm ?suggesting associated volume loss. ?2. Chronic interstitial lung disease/fibrosis. ?3. Emphysema. ?Medications given: 80 mg IV Lasix, 0.5 IV Dilaudid ? ?Hospitalist admitting the patient to stepdown unit, PCCM asked to consult for assistance with acute on chronic hypoxic respiratory failure requiring BiPAP. ? ?Pertinent  Medical History  ?COPD requiring 4 L of oxygen chronically ?ESRD on hemodialysis ?HFrEF (LVEF 40 to 45%) >> 50-55% ?Ischemic cardiomyopathy ?Coronary artery disease ?Hypertension ? ?Micro Data:  ?4/17: SARS-CoV-2 and influenza PCR>>negative ?4/21: Respiratory>> pending ? ?Antimicrobials:  ?Cefepime 04/21>> ? ?Significant Hospital Events: ?Including procedures, antibiotic start and stop dates in addition to other pertinent events   ?4/17: Presented to ED with shortness of breath, placed on BiPAP.  Admitted by the hospitalist, PCCM consulted.  To receive 1 unit of blood with hemodialysis today due to anemia pt does take plavix ?4/18: PCCM team signed off  ?4/18: GI consulted  ?4/21: Upper endoscopy revealed normal esophagus. Normal examined duodenum. 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 Treated with argon beam coagulation. No specimens collected. ?4/21: Rapid response called during hemodialysis pt developed severe hypoxic respiratory failure minimally responsive requiring transfer to ICU for emergent mechanical intubation  ?4/22: Will perform SBT with plans for possible extubation following hemodialysis with fluid removal  ?4/23: Tolerating SBT, dialyzed yesterday evening, plan for extubation today ? ?Interim History / Subjective:  ?Pt following commands and opening eyes spontaneously.  Vent settings: FiO2 30% PEEP 5.  No acute events overnight.  Tolerated dialysis yesterday ? ?Objective   ?Blood pressure (!) 159/72, pulse 63, temperature 99.5 ?F  (37.5 ?C),  temperature source Oral, resp. rate 19, height '5\' 11"'$  (1.803 m), weight 96.4 kg, SpO2 100 %. ?   ?Vent Mode: PSV;CPAP ?FiO2 (%):  [30 %] 30 % ?Set Rate:  [16 bmp] 16 bmp ?Vt Set:  [500 mL] 500 mL ?PEEP:  [5 cmH20] 5 cmH20 ?Pressure Support:  [8 cmH20] 8 cmH20 ?Plateau Pressure:  [16 cmH20] 16 cmH20  ? ?Intake/Output Summary (Last 24 hours) at 08/03/2021 0830 ?Last data filed at 08/03/2021 0800 ?Gross per 24 hour  ?Intake 633.54 ml  ?Output 872 ml  ?Net -238.46 ml  ? ? ?Filed Weights  ? 08/01/21 1735 08/01/21 1749 08/02/21 1713  ?Weight: 90.9 kg 99.6 kg 96.4 kg  ? ? ?Examination: ?General: Acute on chronically ill-appearing male, off of sedation, follows commands, can lift head off the pillow.  Good air leak.  Mechanically ventilated, switched to SBT ?HEENT: JVD present, supple  ?Lungs: Coarse breath sounds, slightly diminished on the right base, no wheezes. ?Cardiovascular: Paced rhythm, no murmurs, rubs, gallops, 2+ radial/1+ distal pulses, 2+ bilateral lower extremity edema  ?Abdomen: + BS x4, obese, soft, non distended  ?Extremities: Normal tone  ?Neuro: Coming off of sedation, following commands, PERRL ?GU: Patient is oliguric at baseline ? ?Resolved Hospital Problem list   ? ? ?Assessment & Plan:  ? ?Acute on chronic hypoxic respiratory failure secondary to HCAP, pulmonary edema, and bilateral pleural effusions  ?-On SBT anticipate extubation today ?-Goal plateau pressure less than 30, driving pressure less than 15 ?-PAD protocol, goal RASS 0 to -1: fentanyl gtt and prn versed ?-RIGHT pleural effusion persists, left effusion improved to resolved ?-Follow chest x-ray if persistent may need thoracentesis on RIGHT ?-Continue cefepime ?-Follow sputum culture 4/21 ?-Pulmonary hygiene ?-Standing nebulizers with DuoNeb every 6 hours ? ?Symptomatic Anemia s/p EGD 04/21 revealed three non-bleeding angioectasias in the stomach. Treated with argon plasma coagulation (APC) ?-Monitor for S/Sx of bleeding ?-Trend  CBC, H&H stable today ?-SCD's for VTE Prophylaxis  ?-Transfuse for Hgb <7 ?-Continue PPI ?-GI consulted appreciate input  ? ?Mildly Elevated Troponin, suspect demand ischemia ?New onset Atrial Fibrillation~resolved  ?Acute on chronic HFrEF (LVEF 40-45% on Echo 10/22) ?PMHx: Ischemic cardiomyopathy ?-Continuous cardiac monitoring ?-Maintain MAP >65 ?-Echo results show improved LVEF to 50 to 55%, there are wall motion abnormalities ?-No anticoagulation or antiplatelets given anemia ?-Hold outpatient antihypertensives due to soft bp readings  ?-Cardiology following, appreciate input ? ?ESRD on Hemodialysis ?-Monitor I&O's / urinary output ?-Follow BMP ?-Ensure adequate renal perfusion ?-Avoid nephrotoxic agents as able ?-Replace electrolytes as indicated ?-Nephrology consulted, appreciate input~HD today 04/22 with fluid removal ? ?Hypomagnesemia ?-Being supplemented ? ?Best Practice (right click and "Reselect all SmartList Selections" daily)  ? ?Diet/type: NPO ?DVT prophylaxis: SCD ?GI prophylaxis: PPI ?Lines: Central line right femoral 4/21, will reassess further need today ?Foley:  N/A ?Code Status:  Partial Code ?Last date of multidisciplinary goals of care discussion [4/21] ? ?Updated pts daughter Peter [name] at bedside.  Viewed radiographs with daughter. ? ?Labs   ?CBC: ?Recent Labs  ?Lab 07/28/21 ?0824 07/28/21 ?1549 07/30/21 ?0316 07/30/21 ?1650 07/31/21 ?0617 08/01/21 ?9024 08/02/21 ?0973 08/03/21 ?0440  ?WBC 8.2   < > 9.5  --  7.2 6.8 7.2 8.5  ?NEUTROABS 5.6  --   --   --   --   --   --   --   ?HGB 6.7*   < > 7.6* 7.9* 8.5* 7.9* 8.3* 8.7*  ?HCT 21.7*   < > 24.0* 24.5* 26.3* 24.9* 26.8* 27.6*  ?  MCV 96.0   < > 93.4  --  91.0 92.6 96.1 93.2  ?PLT 161   < > 131*  --  133* 127* 141* 160  ? < > = values in this interval not displayed.  ? ? ? ?Basic Metabolic Panel: ?Recent Labs  ?Lab 07/29/21 ?1643 07/30/21 ?0316 07/31/21 ?0617 08/01/21 ?5391 08/02/21 ?2258 08/03/21 ?0440  ?NA 136 135 135 136 136 135  ?K 3.9 3.9  3.4* 4.1 4.3 3.8  ?CL 100 99 100 98 98 98  ?CO2 '28 27 31 31 '$ 33* 29  ?GLUCOSE 120* 104* 96 102* 81 72  ?BUN 27* 44* 30* 43* 28* 24*  ?CREATININE 3.41* 4.69* 3.43* 4.44* 3.45* 3.43*  ?CALCIUM 8.2* 8.2* 7.9* 8.3*

## 2021-08-03 NOTE — Progress Notes (Signed)
[facility] ? ? ? ?SUBJECTIVE: Patient still has some shortness of breath but much improved he has hoarse voice from recent extubation no pain no fever feels somewhat improved ? ? ?Vitals:  ? 08/03/21 1215 08/03/21 1300 08/03/21 1400 08/03/21 1500  ?BP: (!) 143/58 (!) 120/52 (!) 136/110   ?Pulse: 70 61 77 96  ?Resp: 13 17 (!) 23 17  ?Temp:      ?TempSrc:      ?SpO2: 100% 93% 97% 98%  ?Weight:      ?Height:      ? ? ? ?Intake/Output Summary (Last 24 hours) at 08/03/2021 1527 ?Last data filed at 08/03/2021 1500 ?Gross per 24 hour  ?Intake 596.81 ml  ?Output 872 ml  ?Net -275.19 ml  ? ? ? ? ?PHYSICAL EXAM ? ?General: Well developed, well nourished, in no acute distress ?HEENT:  Normocephalic and atramatic ?Neck:  No JVD.  ?Lungs: Bilateral rhonchi dullness in the right base bilaterally to auscultation and percussion. ?Heart: HRRR . Normal S1 and S2 without gallops or murmurs.  ?Abdomen: Bowel sounds are positive, abdomen soft and non-tender  ?Msk:  Back normal, normal gait. Normal strength and tone for age. ?Extremities: No clubbing, cyanosis or edema.   ?Neuro: Alert and oriented X 3. ?Psych:  Good affect, responds appropriately ? ? ?LABS: ?Basic Metabolic Panel: ?Recent Labs  ?  08/02/21 ?7341 08/03/21 ?0440  ?NA 136 135  ?K 4.3 3.8  ?CL 98 98  ?CO2 33* 29  ?GLUCOSE 81 72  ?BUN 28* 24*  ?CREATININE 3.45* 3.43*  ?CALCIUM 8.1* 8.2*  ?MG 1.9 1.6*  ?PHOS 3.5 2.8  ? ?Liver Function Tests: ?Recent Labs  ?  08/03/21 ?0440  ?ALBUMIN 2.8*  ? ?No results for input(s): LIPASE, AMYLASE in the last 72 hours. ?CBC: ?Recent Labs  ?  08/02/21 ?9379 08/03/21 ?0440  ?WBC 7.2 8.5  ?HGB 8.3* 8.7*  ?HCT 26.8* 27.6*  ?MCV 96.1 93.2  ?PLT 141* 160  ? ?Cardiac Enzymes: ?No results for input(s): CKTOTAL, CKMB, CKMBINDEX, TROPONINI in the last 72 hours. ?BNP: ?Invalid input(s): POCBNP ?D-Dimer: ?No results for input(s): DDIMER in the last 72 hours. ?Hemoglobin A1C: ?No results for input(s): HGBA1C in the last 72 hours. ?Fasting Lipid  Panel: ?No results for input(s): CHOL, HDL, LDLCALC, TRIG, CHOLHDL, LDLDIRECT in the last 72 hours. ?Thyroid Function Tests: ?No results for input(s): TSH, T4TOTAL, T3FREE, THYROIDAB in the last 72 hours. ? ?Invalid input(s): FREET3 ?Anemia Panel: ?No results for input(s): VITAMINB12, FOLATE, FERRITIN, TIBC, IRON, RETICCTPCT in the last 72 hours. ? ?DG Abd 1 View ? ?Result Date: 08/01/2021 ?CLINICAL DATA:  Enteric tube placement EXAM: ABDOMEN - 1 VIEW COMPARISON:  Upper GI done on 05/30/2021 FINDINGS: Tip of enteric tube is seen in the region of body of stomach. There is previous endovascular stent repair of abdominal aorta. Renal artery stents are noted. Biventricular pacer leads are noted in place. Transverse diameter of heart is increased. There is increased density in the left lower lung fields obscuring the left hemidiaphragm. IMPRESSION: Tip of enteric tube is seen in the stomach. Increased density in the left lower lung fields may suggest pleural effusion and possibly underlying infiltrate. Electronically Signed   By: Elmer Picker M.D.   On: 08/01/2021 19:45  ? ?DG Chest Port 1 View ? ?Result Date: 08/03/2021 ?CLINICAL DATA:  Ventilator dependent respiratory failure. EXAM: PORTABLE CHEST 1 VIEW COMPARISON:  Portable chest 08/01/2021. FINDINGS: 5:05 a.m., 08/03/2021. NGT enters the stomach with the intragastric course not included  in the field. ETT tip is 6 cm from carina. Right IJ dialysis catheter tip remains at the superior cavoatrial junction level. Left chest dual lead pacing system with AID wiring is unaltered. The heart is enlarged. Stable mediastinum with aortic atherosclerosis. Central vessels are less distended today. There is mild perihilar vascular congestion, mild interstitial edema with improvement in the edema component which has largely resolved in the upper lung zones and is less pronounced in the central and lower zones. Moderate right and small left pleural effusions continue to be seen  with hazy interstitial consolidation in the right-greater-than-left lower lung fields, again could represent atelectasis or consolidation. Pleural effusions are unchanged. IMPRESSION: 1. Improvement in interstitial edema and perihilar vascular congestion. 2. No interval change in right-greater-than-left pleural effusions and lower zonal lung opacities. Electronically Signed   By: Telford Nab M.D.   On: 08/03/2021 07:23  ? ?DG Chest Port 1 View ? ?Result Date: 08/01/2021 ?CLINICAL DATA:  ET and OG placement EXAM: PORTABLE CHEST 1 VIEW COMPARISON:  07/28/2021, 05/28/2021, CT 05/27/2021 FINDINGS: Endotracheal tube tip is about 5.7 cm superior to carina. Esophageal tube tip overlies the mid to proximal stomach, side-port in the region of GE junction. Right-sided central venous catheter tip over the SVC. Left-sided pacing device as before. Cardiomegaly with vascular congestion, pulmonary edema and right greater than left pleural effusions. Worsening aeration at the bases and within the right thorax. No visible pneumothorax. Partially visualized aortic stent graft. IMPRESSION: 1. Endotracheal tube tip about 5.7 cm superior to carina. Esophageal tube side-port in the region of GE junction, consider further advancement for more optimal positioning 2. Cardiomegaly with vascular congestion, pulmonary edema and right greater than left pleural effusions with overall worsened aeration as compared with radiograph earlier today Electronically Signed   By: Donavan Foil M.D.   On: 08/01/2021 18:15  ? ?ECHOCARDIOGRAM COMPLETE ? ?Result Date: 08/02/2021 ?   ECHOCARDIOGRAM REPORT   Patient Name:   Peter [name] Date of Exam: 08/02/2021 Medical Rec #:  [id number]        Height:       71.0 in Accession #:    [id number]       Weight:       219.6 lb Date of Birth:  [birth year]       BSA:          2.194 m? Patient Age:    81 years         BP:           104/51 mmHg Patient Gender: M                HR:           72 bpm. Exam Location:  [facility]  Procedure: 2D Echo Indications:     Acute Respiratory Distress  History:         Patient has prior history of Echocardiogram examinations, most                  recent 05/29/2021.  Sonographer:     Kathlen Brunswick RDCS Referring Phys:  [id number] Teressa Lower Diagnosing Phys: Yolonda Kida MD  Sonographer Comments: Echo performed with patient supine and on artificial respirator. IMPRESSIONS  1. Mild inferior/lateral/apical hypo.  2. Left ventricular ejection fraction, by estimation, is 50 to 55%. The left ventricle has low normal function. The left ventricle demonstrates regional wall motion abnormalities (see scoring diagram/findings for description). The left ventricular internal cavity size was moderately to  severely dilated. There is mild concentric left ventricular hypertrophy. Left ventricular diastolic parameters are consistent with Grade I diastolic dysfunction (impaired relaxation).  3. Right ventricular systolic function is normal. The right ventricular size is normal.  4. Left atrial size was mildly dilated.  5. Right atrial size was mildly dilated.  6. The mitral valve is normal in structure. No evidence of mitral valve regurgitation. Moderate to severe mitral stenosis.  7. Tricuspid valve regurgitation is moderate to severe.  8. The aortic valve is grossly normal. Aortic valve regurgitation is trivial. FINDINGS  Left Ventricle: Left ventricular ejection fraction, by estimation, is 50 to 55%. The left ventricle has low normal function. The left ventricle demonstrates regional wall motion abnormalities. The left ventricular internal cavity size was moderately to severely dilated. There is mild concentric left ventricular hypertrophy. Left ventricular diastolic parameters are consistent with Grade I diastolic dysfunction (impaired relaxation). Right Ventricle: The right ventricular size is normal. No increase in right ventricular wall thickness. Right ventricular systolic function is normal. Left Atrium:  Left atrial size was mildly dilated. Right Atrium: Right atrial size was mildly dilated. Pericardium: Trivial pericardial effusion is present. Mitral Valve: The mitral valve is normal in structure. No evid

## 2021-08-03 NOTE — Progress Notes (Signed)
0705: Patient in bed resting, no signs of distress. Vital signs stable.  ? ?0720: Patient appropriately interactive on fentanyl gtt. MD rounding, planning to extubate patient. Fentanyl weaning (per [facility]).  ? ?56: Patient daughter bedside, update provided. Patient continuing to tolerate fentanyl weaning.  ? ?0850: Patient daughter asking about the extubation process. Daughter educated on process and questions answered, informed that every patient is different but patient is currently tolerating SBT.  ? ?0915: MD bedside, explaining plan of care and patient condition to daughter. Daughter verbalizes understanding.Patient extubated by RT with RN and MD bedside. Patient maintaining oxygen saturation on 4L [place], which is what he uses at home. Patient vital signs stable with no signs of distress. Per MD okay to do swallow eval in a few hours. Patient with no requests at this time.  ? ?35: Patient voice hoarse and nods that his throat is sore, requesting water to drink. Informed of safety for recently extubated patients and aspiration risk, oral care provided and moisturizer applied. Patient nods that he feels better after swabs with cool water. Patient with no other requests at this time.  ? ?1150: Patient tolerated removal of R fem CVC, gauze/transparent dressing clean dry and intact.  ? ?1215: Patient awake, alert and oriented continues to request water. Mouth swabbed and suctioned. Attempt swallow eval with patient, patient does not show any immediate signs of aspiration, however has chronic cough about 15-30 seconds after swallowing. Per patient daughter, this has been an ongoing issue. Patient BG 66, patient offered apple sauce however same cough and patient "not sure" if his swallowing feels normal. Will keep patient NPO and re-attempt swallow eval later today.  ? ?1405: Patient continues to request water to drink, again educated on high risk for aspiration. Mouth swabbed with cool mouth sponge. MD bedside, patient  to have ST eval and change to stepdown  status.  ? ?1414: Call placed to chaplin as patients daughter has questions about advanced directives. Chaplin to see patient and family today.  ? ?1845: Long discussion with patient daughter re palliative care consult and goals of care for patient. While speaking with patient daughter, patient oxygen saturation dipping into low 80's while in a deep sleep but returns to 90's when woken up. MD aware and bedside, will order PM bipap. Continued discussion with patient daughter about patient plan of care. Patient asleep and appears comfortable, patient daughter very appreciative of time spent discussing her father.  ? ?

## 2021-08-03 NOTE — Progress Notes (Signed)
?   08/03/21 1415  ?Clinical Encounter Type  ?Visited With Family  ?Visit Type Initial;Spiritual support;Social support  ?Referral From Family  ?Consult/Referral To Chaplain  ?Spiritual Encounters  ?Spiritual Needs Other (Comment);Emotional ?(decision making)  ? ?Chaplain Burris responded to request from Pt's daughter, [name]. Chaplain Burris provided compassionate presence and active listening as [name] shared aspects she is still considering with regard to code status and discussions to include her father in care planning. Chaplain B invite [name] to share all her current concerns and to have an opportunity to process events of the past several days. Chaplain B encouraged [name] to rest and practice some self-care. Chaplain B offered that she will be available throughout the evening to be present for any conversations [name] might like to have with her dad. This chaplain also offered her discretion. ? ?Will plan to f/u later this evening, 4/23. ?

## 2021-08-04 ENCOUNTER — Encounter: Payer: Self-pay | Admitting: Gastroenterology

## 2021-08-04 ENCOUNTER — Inpatient Hospital Stay: Payer: PPO

## 2021-08-04 DIAGNOSIS — Z7189 Other specified counseling: Secondary | ICD-10-CM

## 2021-08-04 DIAGNOSIS — J9621 Acute and chronic respiratory failure with hypoxia: Secondary | ICD-10-CM | POA: Diagnosis not present

## 2021-08-04 LAB — BASIC METABOLIC PANEL
Anion gap: 10 (ref 5–15)
BUN: 32 mg/dL — ABNORMAL HIGH (ref 8–23)
CO2: 27 mmol/L (ref 22–32)
Calcium: 8.2 mg/dL — ABNORMAL LOW (ref 8.9–10.3)
Chloride: 99 mmol/L (ref 98–111)
Creatinine, Ser: 4.54 mg/dL — ABNORMAL HIGH (ref 0.61–1.24)
GFR, Estimated: 12 mL/min — ABNORMAL LOW (ref >60)
Glucose, Bld: 58 mg/dL — ABNORMAL LOW (ref 70–99)
Potassium: 4.4 mmol/L (ref 3.5–5.1)
Sodium: 136 mmol/L (ref 135–145)

## 2021-08-04 LAB — CBC
HCT: 27.9 % — ABNORMAL LOW (ref 39.0–52.0)
Hemoglobin: 8.3 g/dL — ABNORMAL LOW (ref 13.0–17.0)
MCH: 29.5 pg (ref 26.0–34.0)
MCHC: 29.7 g/dL — ABNORMAL LOW (ref 30.0–36.0)
MCV: 99.3 fL (ref 80.0–100.0)
Platelets: 139 10*3/uL — ABNORMAL LOW (ref 150–400)
RBC: 2.81 MIL/uL — ABNORMAL LOW (ref 4.22–5.81)
RDW: 15.3 % (ref 11.5–15.5)
WBC: 7.7 10*3/uL (ref 4.0–10.5)
nRBC: 0 % (ref 0.0–0.2)

## 2021-08-04 LAB — PROTEIN, TOTAL: Total Protein: 4.9 g/dL — ABNORMAL LOW (ref 6.5–8.1)

## 2021-08-04 LAB — PROTEIN, PLEURAL OR PERITONEAL FLUID: Total protein, fluid: <3 g/dL

## 2021-08-04 LAB — LACTATE DEHYDROGENASE, PLEURAL OR PERITONEAL FLUID: LD, Fluid: 47 U/L — ABNORMAL HIGH (ref 3–23)

## 2021-08-04 LAB — GLUCOSE, CAPILLARY
Glucose-Capillary: 158 mg/dL — ABNORMAL HIGH (ref 70–99)
Glucose-Capillary: 55 mg/dL — ABNORMAL LOW (ref 70–99)
Glucose-Capillary: 70 mg/dL (ref 70–99)
Glucose-Capillary: 73 mg/dL (ref 70–99)
Glucose-Capillary: 90 mg/dL (ref 70–99)
Glucose-Capillary: 80 mg/dL (ref 70–99)
Glucose-Capillary: 86 mg/dL (ref 70–99)

## 2021-08-04 LAB — PHOSPHORUS: Phosphorus: 6.2 mg/dL — ABNORMAL HIGH (ref 2.5–4.6)

## 2021-08-04 LAB — MAGNESIUM: Magnesium: 2.3 mg/dL (ref 1.7–2.4)

## 2021-08-04 LAB — BODY FLUID CELL COUNT WITH DIFFERENTIAL
Eos, Fluid: 0 %
Lymphs, Fluid: 7 %
Monocyte-Macrophage-Serous Fluid: 13 %
Neutrophil Count, Fluid: 80 %
Total Nucleated Cell Count, Fluid: 55 cu mm

## 2021-08-04 LAB — LACTATE DEHYDROGENASE: LDH: 135 U/L (ref 98–192)

## 2021-08-04 MED ORDER — FENTANYL CITRATE PF 50 MCG/ML IJ SOSY
12.5000 ug | PREFILLED_SYRINGE | Freq: Once | INTRAMUSCULAR | Status: AC
  Administered 2021-08-05: 12.5 ug via INTRAVENOUS
  Filled 2021-08-04: qty 1

## 2021-08-04 MED ORDER — DEXTROSE 50 % IV SOLN
INTRAVENOUS | Status: AC
  Administered 2021-08-04: 50 mL
  Filled 2021-08-04: qty 50

## 2021-08-04 MED ORDER — MORPHINE SULFATE (PF) 2 MG/ML IV SOLN
1.0000 mg | Freq: Once | INTRAVENOUS | Status: AC
  Administered 2021-08-04: 1 mg via INTRAVENOUS
  Filled 2021-08-04: qty 1

## 2021-08-04 MED ORDER — MORPHINE SULFATE (PF) 2 MG/ML IV SOLN
1.0000 mg | Freq: Once | INTRAVENOUS | Status: AC
  Administered 2021-08-04: 1 mg via INTRAVENOUS

## 2021-08-04 MED ORDER — DIAZEPAM 5 MG/ML IJ SOLN
2.5000 mg | Freq: Four times a day (QID) | INTRAMUSCULAR | Status: DC | PRN
  Administered 2021-08-04 – 2021-08-06 (×5): 2.5 mg via INTRAVENOUS
  Filled 2021-08-04 (×6): qty 2

## 2021-08-04 MED ORDER — MORPHINE SULFATE (PF) 2 MG/ML IV SOLN
INTRAVENOUS | Status: AC
  Filled 2021-08-04: qty 1

## 2021-08-04 MED ORDER — DEXMEDETOMIDINE HCL IN NACL 400 MCG/100ML IV SOLN
0.4000 ug/kg/h | INTRAVENOUS | Status: DC
  Administered 2021-08-04: 0.4 ug/kg/h via INTRAVENOUS
  Administered 2021-08-05: 1.2 ug/kg/h via INTRAVENOUS
  Administered 2021-08-05: 0.6 ug/kg/h via INTRAVENOUS
  Administered 2021-08-05: 1.1 ug/kg/h via INTRAVENOUS
  Filled 2021-08-04 (×5): qty 100

## 2021-08-04 MED ORDER — IPRATROPIUM-ALBUTEROL 0.5-2.5 (3) MG/3ML IN SOLN
3.0000 mL | RESPIRATORY_TRACT | Status: DC
  Administered 2021-08-04 – 2021-08-05 (×7): 3 mL via RESPIRATORY_TRACT
  Filled 2021-08-04 (×8): qty 3

## 2021-08-04 MED ORDER — DEXTROSE 50 % IV SOLN: 1.0000 | Freq: Once | INTRAVENOUS | Status: AC

## 2021-08-04 NOTE — Progress Notes (Signed)
?[facility]  ?ROUNDING NOTE  ? ?Subjective:  ? ?Peter [name] is a 81 year old male with past medical conditions including chronic systolic heart failure with EF 40 to 45%, chronic respiratory failure on 4 L nasal cannula, COPD, hypertension, CAD, and end-stage renal disease on hemodialysis.  Patient presents to the emergency department with complaints of shortness of breath progressively worse since yesterday.  Patient has been admitted for Acute respiratory failure ([place]) [J96.00] ?ESRD (end stage renal disease) ([place]) [N18.6] ?Symptomatic anemia [D64.9] ?Acute on chronic respiratory failure with hypoxemia ([place]) [J96.21] ? ?Patient is known to our practice and receives outpatient dialysis at [facility] on a MWF schedule, supervised by Dr. Candiss Norse.  ? ?Update: ?Patient seen while on BiPAP ?According to nurse, patient very anxious this morning, grabbing his throat stating he can't breath.  ?O2 sats acceptable, patient placed on Bipap for comfort.  ?Patient continues to be anxious, talking about death/dying. Does not want to be left alone, feels everyone is leaving him.  ? ? ?Objective:  ?Vital signs in last 24 hours:  ?Temp:  [97.2 ?F (36.2 ?C)-99 ?F (37.2 ?C)] 99 ?F (37.2 ?C) (04/24 0800) ?Pulse Rate:  [46-96] 62 (04/24 1100) ?Resp:  [11-29] 16 (04/24 1100) ?BP: (104-157)/(51-110) 135/66 (04/24 1100) ?SpO2:  [92 %-100 %] 99 % (04/24 1100) ?FiO2 (%):  [30 %-50 %] 30 % (04/24 0900) ? ?Weight change:  ?Filed Weights  ? 08/01/21 1735 08/01/21 1749 08/02/21 1713  ?Weight: 90.9 kg 99.6 kg 96.4 kg  ? ? ?Intake/Output: ?I/O last 3 completed shifts: ?In: 596.8 [I.V.:278.2; IV Piggyback:318.6] ?Out: 872 [Other:872] ?  ?Intake/Output this shift: ? Total I/O ?In: 113.9 [I.V.:13.6; IV Piggyback:100.3] ?Out: -  ? ?Physical Exam: ?General: Critically ill appearing, anxious  ?Head: Normocephalic, atraumatic. Moist oral mucosal membranes  ?Eyes: Anicteric  ?Lungs:  Normal effort, scattered rhonchi  ?Heart:  Irregular rhythm  ?Abdomen:  Soft, nontender, nondistended  ?Extremities: Trace peripheral edema.  ?Neurologic: Nonfocal, moving all four extremities  ?Skin: No lesions  ?Access: Right PermCath  ? ? ?Basic Metabolic Panel: ?Recent Labs  ?Lab 07/31/21 ?0617 08/01/21 ?0238 08/02/21 ?0102 08/03/21 ?7253 08/04/21 ?0404  ?NA 135 136 136 135 136  ?K 3.4* 4.1 4.3 3.8 4.4  ?CL 100 98 98 98 99  ?CO2 31 31 33* 29 27  ?GLUCOSE 96 102* 81 72 58*  ?BUN 30* 43* 28* 24* 32*  ?CREATININE 3.43* 4.44* 3.45* 3.43* 4.54*  ?CALCIUM 7.9* 8.3* 8.1* 8.2* 8.2*  ?MG 1.7 2.0 1.9 1.6* 2.3  ?PHOS 2.9 3.3 3.5 2.8 6.2*  ? ? ? ?Liver Function Tests: ?Recent Labs  ?Lab 07/28/21 ?1549 08/03/21 ?0440  ?ALBUMIN 3.1* 2.8*  ? ? ?No results for input(s): LIPASE, AMYLASE in the last 168 hours. ?No results for input(s): AMMONIA in the last 168 hours. ? ?CBC: ?Recent Labs  ?Lab 07/31/21 ?0617 08/01/21 ?0238 08/02/21 ?6644 08/03/21 ?0347 08/04/21 ?0404  ?WBC 7.2 6.8 7.2 8.5 7.7  ?HGB 8.5* 7.9* 8.3* 8.7* 8.3*  ?HCT 26.3* 24.9* 26.8* 27.6* 27.9*  ?MCV 91.0 92.6 96.1 93.2 99.3  ?PLT 133* 127* 141* 160 139*  ? ? ? ?Cardiac Enzymes: ?No results for input(s): CKTOTAL, CKMB, CKMBINDEX, TROPONINI in the last 168 hours. ? ?BNP: ?Invalid input(s): POCBNP ? ?CBG: ?Recent Labs  ?Lab 08/03/21 ?1214 08/03/21 ?1239 08/03/21 ?1813 08/04/21 ?4259 08/04/21 ?0944  ?GLUCAP 66* 117* 80 55* 70  ? ? ? ?Microbiology: ?Results for orders placed or performed during the hospital encounter of 07/28/21  ?Resp Panel  by RT-PCR (Flu A&B, Covid) Nasopharyngeal Swab     Status: None  ? Collection Time: 07/28/21  8:24 AM  ? Specimen: Nasopharyngeal Swab; Nasopharyngeal(NP) swabs in vial transport medium  ?Result Value Ref Range Status  ? SARS Coronavirus 2 by RT PCR NEGATIVE NEGATIVE Final  ?  Comment: (NOTE) ?SARS-CoV-2 target nucleic acids are NOT DETECTED. ? ?The SARS-CoV-2 RNA is generally detectable in upper respiratory ?specimens during the acute phase of infection. The  lowest ?concentration of SARS-CoV-2 viral copies this assay can detect is ?138 copies/mL. A negative result does not preclude SARS-Cov-2 ?infection and should not be used as the sole basis for treatment or ?other patient management decisions. A negative result may occur with  ?improper specimen collection/handling, submission of specimen other ?than nasopharyngeal swab, presence of viral mutation(s) within the ?areas targeted by this assay, and inadequate number of viral ?copies(<138 copies/mL). A negative result must be combined with ?clinical observations, patient history, and epidemiological ?information. The expected result is Negative. ? ?Fact Sheet for Patients:  ?EntrepreneurPulse.com.au ? ?Fact Sheet for Healthcare Providers:  ?IncredibleEmployment.be ? ?This test is no t yet approved or cleared by the [place] FDA and  ?has been authorized for detection and/or diagnosis of SARS-CoV-2 by ?FDA under an Emergency Use Authorization (EUA). This EUA will remain  ?in effect (meaning this test can be used) for the duration of the ?COVID-19 declaration under Section 564(b)(1) of the Act, 21 ?U.S.C.section 360bbb-3(b)(1), unless the authorization is terminated  ?or revoked sooner.  ? ? ?  ? Influenza A by PCR NEGATIVE NEGATIVE Final  ? Influenza B by PCR NEGATIVE NEGATIVE Final  ?  Comment: (NOTE) ?The Xpert Xpress SARS-CoV-2/FLU/RSV plus assay is intended as an aid ?in the diagnosis of influenza from Nasopharyngeal swab specimens and ?should not be used as a sole basis for treatment. Nasal washings and ?aspirates are unacceptable for Xpert Xpress SARS-CoV-2/FLU/RSV ?testing. ? ?Fact Sheet for Patients: ?EntrepreneurPulse.com.au ? ?Fact Sheet for Healthcare Providers: ?IncredibleEmployment.be ? ?This test is not yet approved or cleared by the [place] FDA and ?has been authorized for detection and/or diagnosis of SARS-CoV-2 by ?FDA under  an Emergency Use Authorization (EUA). This EUA will remain ?in effect (meaning this test can be used) for the duration of the ?COVID-19 declaration under Section 564(b)(1) of the Act, 21 U.S.C. ?section 360bbb-3(b)(1), unless the authorization is terminated or ?revoked. ? ?Performed at [facility], [place], ?[place] [address] ?  ?MRSA Next Gen by PCR, Nasal     Status: None  ? Collection Time: 07/30/21 12:21 PM  ? Specimen: Nasal Mucosa; Nasal Swab  ?Result Value Ref Range Status  ? MRSA by PCR Next Gen NOT DETECTED NOT DETECTED Final  ?  Comment: (NOTE) ?The GeneXpert MRSA Assay (FDA approved for NASAL specimens only), ?is one component of a comprehensive MRSA colonization surveillance ?program. It is not intended to diagnose MRSA infection nor to guide ?or monitor treatment for MRSA infections. ?Test performance is not FDA approved in patients less than 2 years ?old. ?Performed at [facility], [place], ?[place] [address] ?  ?Culture, Respiratory w Gram Stain     Status: None (Preliminary result)  ? Collection Time: 08/01/21  6:06 PM  ? Specimen: SPU; Respiratory  ?Result Value Ref Range Status  ? Specimen Description   Final  ?  SPUTUM ?Performed at [facility], [address] [place], [place] [address] ?  ? Special Requests CULTURE  Final  ?  Gram Stain   Final  ?  ABUNDANT WBC PRESENT, PREDOMINANTLY PMN ?NO ORGANISMS SEEN ?  ? Culture   Final  ?  CULTURE REINCUBATED FOR BETTER GROWTH ?Performed at [facility], [place] [address], [place], [place] [address] ?  ? Report Status PENDING  Incomplete  ? ? ?Coagulation Studies: ?No results for input(s): LABPROT, INR in the last 72 hours. ? ? ?Urinalysis: ?No results for input(s): COLORURINE, LABSPEC, [place], GLUCOSEU, HGBUR, BILIRUBINUR, KETONESUR, PROTEINUR, UROBILINOGEN, NITRITE, LEUKOCYTESUR in the last 72 hours. ? ?Invalid input(s): APPERANCEUR  ? ? ?Imaging: ?DG Chest Port 1 View ? ?Result Date:  08/04/2021 ?CLINICAL DATA:  Pleural effusions.  Asthma.  CHF. EXAM: PORTABLE CHEST 1 VIEW COMPARISON:  08/03/2021 FINDINGS: 0501 hours. Low volume film. The cardio pericardial silhouette is enlarged. Similar bibasilar c

## 2021-08-04 NOTE — Progress Notes (Signed)
PT Cancellation Note ? ?Patient Details ?Name: Peter [name] ?MRN: [id number] ?DOB: [birth year] ? ? ?Cancelled Treatment:    Reason Eval/Treat Not Completed: Medical issues which prohibited therapy. Patient appears anxious in the bed. Agree with the nurse at the bedside requesting to hold PT at this time. He is also anticipated to have hemodialysis today. PT will follow up when patient is more appropriate for mobility assessment.  ? ?Minna Merritts, PT, MPT ? ?[name] ?08/04/2021, 8:32 AM ?

## 2021-08-04 NOTE — Progress Notes (Signed)
?   08/04/21 1100  ?Clinical Encounter Type  ?Visited With Patient and family together  ?Visit Type Follow-up  ?Consult/Referral To Chaplain  ? ?Chaplain provided follow up support. Patient conversant.  ?

## 2021-08-04 NOTE — TOC Progression Note (Signed)
Transition of Care (TOC) - Progression Note  ? ? ?Patient Details  ?Name: Peter [name] ?MRN: [id number] ?Date of Birth: 08-10-1940 ? ?Transition of Care (TOC) CM/SW Contact  ?Shelbie Hutching, RN ?Phone Number: ?08/04/2021, 1:59 PM ? ?Clinical Narrative:    ?TOC continues to follow patient, he was extubated yesterday and is now stepdown status.   ? ? ?Expected Discharge Plan: [place] ?Barriers to Discharge: Continued Medical Work up ? ?Expected Discharge Plan and Services ?Expected Discharge Plan: [place] ?  ?Discharge Planning Services: CM Consult ?Post Acute Care Choice: [place] ?Living arrangements for the past 2 months: Combine ?                ?DME Arranged: N/A ?DME Agency: NA ?  ?  ?  ?HH Arranged: NA ?[place] Agency: NA ?  ?  ?  ? ? ?Social Determinants of Health (SDOH) Interventions ?  ? ?Readmission Risk Interventions ? ?  06/11/2021  ? 12:25 PM 04/28/2021  ? 12:10 PM  ?Readmission Risk Prevention Plan  ?Transportation Screening Complete Complete  ?PCP or Specialist Appt within 3-5 Days  Complete  ?[place] or Home Care Consult  Complete  ?Social Work Consult for [place] Planning/Counseling  Complete  ?Palliative Care Screening  Not Applicable  ?Medication Review Press photographer) Complete Complete  ?PCP or Specialist appointment within 3-5 days of discharge Complete   ?[place] or Home Care Consult Complete   ?SW Recovery Care/Counseling Consult Complete   ?Palliative Care Screening Not Applicable   ?[place] Not Applicable   ? ? ?

## 2021-08-04 NOTE — Consult Note (Addendum)
? ?                                                                                ?Consultation Note ?Date: 08/04/2021  ? ?Patient Name: Peter [name]  ?DOB: 10-01-1940  MRN: [id number]  Age / Sex: 81 y.o., male  ?PCP: Tracie Harrier, MD ?Referring Physician: Flora Lipps, MD ? ?Reason for Consultation: Establishing goals of care ? ?HPI/Patient Profile:  Peter [name] is a 81 y.o. 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 ? ? ?Clinical Assessment and Goals of Care: ?Notes and labs reviewed. Patient getting dialysis. Patient has a child and grandchildren. Widowed with girlfriend 60 years.  ? ?Daughter states in November patient was driving; he had difficulty walking due to leg pain, but was independent. She advises at the end of December he had a hospitalization and has declined since then.  ? ?She states he lived alone and had help during the day, but was afraid to live alone at night. He then moved into [facility] March 14th. She states she has upheld his wishes thus far as this is important to her. Daughter discusses wanting to do what is best for him. ? ?She states she would want him re-intubated if needed. She states CPR will not benefit him. She states she does not think he will leave the hospital. She tells me he does not want to die, that he is afraid of death. She feels there is a spiritual component of this as someone was discussing his salvation with him.   ?  ? ?SUMMARY OF RECOMMENDATIONS   ?Family meeting at 12:00 ? ? ? ?  ? ?Primary Diagnoses: ?Present on Admission: ? Symptomatic anemia ? Acute on chronic respiratory failure with hypoxia (HCC) ? Atrial fibrillation  ([place]) ? Ischemic cardiomyopathy ? ESRD (end stage renal disease) ([place]) ? COPD (chronic obstructive pulmonary disease) with emphysema ([place]) ? Depression ? Elevated troponin ? Acute on chronic systolic CHF (congestive heart failure) ([place]) ? Chronic respiratory failure with hypoxia (HCC) ? ? ?I have reviewed the medical record, interviewed the patient and family, and examined the patient. The following aspects are pertinent. ? ?Past Medical History:  ?Diagnosis Date  ? Anginal pain ([place])   ? Asthma   ? Bladder infection, acute 03/2011  ? "had a whole lot of bleeding from this"  ? CHF (congestive heart failure) ([place])   ? Coronary artery disease   ? High cholesterol   ? Hypertension   ? 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 "had it in my right; getting shots now in my left"  ? Myocardial infarction [facility]) 1996  ? NSTEMI (non-ST elevated myocardial infarction) ([place]) 12/14/2011  ? Shortness of breath 12/14/2011  ? "@ rest, lying down, w/exertion"  ? ?Social History  ? ?Socioeconomic History  ? Marital status: Widowed  ?  Spouse name: Not on file  ? Number of children: Not  on file  ? Years of education: Not on file  ? Highest education level: Not on file  ?Occupational History  ? Not on file  ?Tobacco Use  ? Smoking status: Former  ?  Packs/day: 0.50  ?  Years: 0.50  ?  Pack years: 0.25  ?  Types: Cigarettes  ? Smokeless tobacco: Never  ? Tobacco comments:  ?  11/23/16 Still not ready to quit smoking.  ?Substance and Sexual Activity  ? Alcohol use: Yes  ?  Alcohol/week: 1.0 standard drink  ?  Types: 1 Cans of beer per week  ?  Comment: 12/14/2011 "if my kidneys are sore, I drink 1 beer/day; if not sore; no beer; occasionally drink socially; ave 1 beer/wk maybe"  ? Drug use: No  ? Sexual activity: Not Currently  ?Other Topics Concern  ? Not on file  ?Social History Narrative  ? Not on file  ? ?Social Determinants of Health  ? ?Financial Resource Strain: Not on file  ?Food Insecurity: Not on file  ?Transportation Needs: Not on file   ?Physical Activity: Not on file  ?Stress: Not on file  ?Social Connections: Not on file  ? ?Family History  ?Family history unknown: Yes  ? ?Scheduled Meds: ? atorvastatin  40 mg Per Tube q1800  ? calcitRIOL  0.25 mcg Per Tube Daily  ? Chlorhexidine Gluconate Cloth  6 each Topical Q0600  ? citalopram  20 mg Per Tube Daily  ? docusate  100 mg Per Tube BID  ? epoetin (EPOGEN/PROCRIT) injection  8,000 Units Intravenous Q M,W,F-HD  ? ipratropium-albuterol  3 mL Nebulization Q4H  ? mouth rinse  15 mL Mouth Rinse 10 times per day  ? pantoprazole (PROTONIX) IV  40 mg Intravenous Q24H  ? polyethylene glycol  17 g Per Tube Daily  ? sodium chloride flush  10-40 mL Intracatheter Q12H  ? sodium chloride flush  3 mL Intravenous Q12H  ? cyanocobalamin  1,000 mcg Per Tube Daily  ? ?Continuous Infusions: ? sodium chloride    ? dexmedetomidine (PRECEDEX) IV infusion 0.1 mcg/kg/hr (08/04/21 1500)  ? ?PRN Meds:.sodium chloride, acetaminophen **OR** acetaminophen, albuterol, diazepam, ondansetron **OR** ondansetron (ZOFRAN) IV, sodium chloride flush, sodium chloride flush ?Medications Prior to Admission:  ?Prior to Admission medications   ?Medication Sig Start Date End Date Taking? Authorizing Provider  ?amLODipine (NORVASC) 10 MG tablet Take 1 tablet (10 mg total) by mouth daily. 06/03/21 09/01/21 Yes Enzo Bi, MD  ?aspirin EC 81 MG tablet Take 81 mg by mouth daily. Swallow whole.   Yes [provider]  ?atorvastatin (LIPITOR) 40 MG tablet Take 1 tablet (40 mg total) by mouth daily at 6 PM. 12/18/11  Yes Annita Brod, MD  ?calcitRIOL (ROCALTROL) 0.25 MCG capsule Take 0.25 mcg by mouth daily.   Yes [provider]  ?carvedilol (COREG) 6.25 MG tablet Take 1 tablet (6.25 mg total) by mouth 2 (two) times daily with a meal. 06/03/21 09/01/21 Yes Enzo Bi, MD  ?cetirizine (ZYRTEC) 10 MG tablet Take 10 mg by mouth.   Yes [provider]  ?citalopram (CELEXA) 20 MG tablet Take 20 mg by mouth daily. 05/22/21  Yes  [provider]  ?cyanocobalamin 1000 MCG tablet Take 1,000 mcg by mouth daily.    Yes [provider]  ?finasteride (PROSCAR) 5 MG tablet Take 1 tablet (5 mg total) by mouth daily. 06/03/21 09/01/21 Yes Enzo Bi, MD  ?GLUCOSAMINE-CHONDROITIN-VIT C PO Take 1 tablet by mouth daily.   Yes [provider]  ?hydrALAZINE (APRESOLINE) 100 MG tablet Take 1 tablet (100 mg total) by mouth 3 (three) times daily. 06/03/21 09/01/21 Yes Enzo Bi, MD  ?isosorbide mononitrate (IMDUR) 30 MG 24 hr tablet Take 1 tablet (30 mg total) by mouth daily. 06/03/21 09/01/21 Yes Enzo Bi, MD  ?omeprazole (PRILOSEC) 20 MG capsule Take 20 mg by mouth in the morning.   Yes [provider]  ?tamsulosin (FLOMAX) 0.4 MG CAPS capsule Take 1 capsule (0.4 mg total) by mouth daily. 06/03/21 09/01/21 Yes Enzo Bi, MD  ?umeclidinium bromide (INCRUSE ELLIPTA) 62.5 MCG/ACT AEPB Inhale 1 puff into the lungs daily. 06/03/21 09/01/21 Yes Enzo Bi, MD  ?clobetasol cream (TEMOVATE) 6.78 % Apply 1 application topically 2 (two) times daily as needed (psoriasis).    [provider]  ?clopidogrel (PLAVIX) 75 MG tablet Take 75 mg by mouth daily. ?Patient not taking: Reported on 07/31/2021    [provider]  ? ?Allergies  ?Allergen Reactions  ? Iodinated Contrast Media Shortness Of Breath  ? Iodinated Glycerol  [Glycerol, Iodinated] Shortness Of Breath  ? ?Review of Systems  ?Unable to perform ROS ? ?Physical Exam ?Pulmonary:  ?   Effort: Pulmonary effort is normal.  ?Neurological:  ?   Mental Status: He is alert.  ?   Comments: Confused  ? ? ?Vital Signs: BP 131/73   Pulse (!) 55   Temp (!) 96.6 ?F (35.9 ?C) (Axillary)   Resp 13   Ht '5\' 11"'$  (1.803 m)   Wt 92.3 kg   SpO2 97%   BMI 28.38 kg/m?  ?Pain Scale: Faces ?  ?Pain Score: 0-No pain ? ? ?SpO2: SpO2: 97 % ?O2 Device:SpO2: 97 % ?O2 Flow Rate: .O2 Flow Rate (L/min): 4 L/min ? ?IO: Intake/output summary:  ?Intake/Output Summary (Last 24 hours) at 08/04/2021  1542 ?Last data filed at 08/04/2021 1500 ?Gross per 24 hour  ?Intake 144.64 ml  ?Output --  ?Net 144.64 ml  ? ? ?LBM: Last BM Date : 07/31/21 ?Baseline Weight: Weight: 90 kg ?Most recent weight: Weight: 92.3 kg     ? ? ?Signe

## 2021-08-04 NOTE — Progress Notes (Addendum)
SLP Cancellation Note ? ?Patient Details ?Name: Peter [name] ?MRN: [id number] ?DOB: [birth year] ? ? ?Cancelled treatment:       Reason Eval/Treat Not Completed: Medical issues which prohibited therapy  ? ?Pt requiring BiPap support this AM and pending bedside HD. Will defer SLP evaluation at this time and will continue efforts as appropriate. RN made aware. ? ? ?Cherrie Gauze, M.S., CCC-SLP ?Speech-Language Pathologist ?[facility] ?[phone]) ? ?Quintella Baton ?08/04/2021, 11:09 AM ?

## 2021-08-04 NOTE — Procedures (Signed)
PROCEDURE SUMMARY: ? ?Successful US guided right thoracentesis. ?Yielded 900 mL of clear yellow fluid. ?Pt tolerated procedure well. ?No immediate complications. ? ?Specimen was sent for labs. ?CXR ordered. ? ?EBL < 5 mL ? ?Tsosie Billing D PA-C ?08/04/2021 ?1:57 PM ? ? ? ?

## 2021-08-04 NOTE — Progress Notes (Signed)
0710: Patient in bed resting, no complaints at this time. Patient alert, forgetful to time and full situation, however does endorse being in hospital "all of the days just run together." Patient reoriented and in no obvious distress.  ? ?0805: ICU NP and cardiology MD bedside.  ? ?0815: Call from HD RN re patient going to dialysis suit for treatment. Discussed that this will be patient first HD session after being extubated, not tolerating previous HD session, and still stepdown status that it is preferred HD be done bedside, as patient requires closer monitoring. HD RN states nephrologist gave the okay for patient to be done in HD suite. Again, this RN explained concerns re patient being off the unit for HD. HD RN says "okay, we can have this discussion." Awaiting for update re eta bedside HD.  ? ?0830: Patient is holding his throat saying "I cant breathe and I need some water." Patient maintaining oxygen saturation, however appears to be anxious (kicking his feet and wringing his hands). Oral care provided. Patient instructed on calming measures, however after states "help me, I cant breathe." Secure chat sent to ICU NP, orders placed. Call placed to RT to inform of situation.  ? ?1020: Patient has visitors bedside, trying to communicate and pulling on bipap. Patient placed back on 4L [place] and tolerating. Visitors provided update.  ? ?1202: Spoke with IR re US guided thoracentesis timing as well as evaluation bedside due to patient condition. Will attempt to get done prior to HD.  ?  ?1250: Per IR, patient can come down now for thoracentesis. HD RN still working on another patients treatment, informed HD we will be off unit and be back for HD treatment.  ? ?1415: Returned to ICU. Patient tolerated thoracentesis without any issue.  ? ?6967: Update provided to daughter robin.  ? ? ?

## 2021-08-04 NOTE — Progress Notes (Signed)
? ?NAME:  Peter [name], MRN:  [id number], DOB:  August 11, 1940, LOS: 7 ?ADMISSION DATE:  07/28/2021, CONSULTATION DATE:  07/28/2021 ?REFERRING MD:  Dr.Agbata, CHIEF COMPLAINT:  Shortness of Breath  ? ?Brief Pt Description / Synopsis:  ?81 y.o. Male admitted with Acute on Chronic Hypoxic Respiratory Failure in the setting of Symptomatic Anemia, volume overload, atelectasis, & questionable AECOPD requiring BiPAP. ? ?History of Present Illness:  ?Peter [name] is a 81 year old male with a past medical history significant for COPD requiring 4 L of supplemental O2 chronically, ESRD on hemodialysis, HFrEF (LVEF 40 to 45%), ischemic cardiomyopathy, hypertension, coronary artery disease who presented to [facility] ED on 07/28/2021 due to progressive shortness of breath and fatigue/weakness. ? ?He reports that at baseline he is usually short of breath, however this is worsened over the last 2 to 3 days.  He reports associated wheezing and nonproductive cough.  He had his pulse ox checked which was 80% on 4 L.  He denies fever or chills, worsening of bilateral lower extremity edema, chest pain, nausea, vomiting, diarrhea, abdominal pain, palpitations..  He does report symptoms are worse when laying flat and with any form of exertion.  He reports he has been compliant and not missed any scheduled dialysis appointment, which she completed his last session on Friday 4/14.  He reported dark stools, but denied hematochezia or hematemesis. ? ?EMS gave Solu-Medrol in route to the ED.  Given his work of breathing, he was placed on BiPAP in the ED. ? ?ED Course: ?Initial vital signs: Temperature 97.6, respiratory rate 20, pulse 80, blood pressure 147/90, 96% ?Significant Labs: Glucose 109, BUN 38, creatinine 4.93, BNP 3382, high-sensitivity troponin 269, procalcitonin less than 0.1, WBC 8.2, hemoglobin 6.7, hematocrit 21.7 ?COVID-19 PCR negative ?Imaging: ?Chest x-ray>>IMPRESSION: ?1. Increased opacity at the RIGHT lung base. This could  represent ?pneumonia, aspiration, atelectasis or asymmetric pulmonary edema. ?Favor atelectasis given the elevation of the RIGHT hemidiaphragm ?suggesting associated volume loss. ?2. Chronic interstitial lung disease/fibrosis. ?3. Emphysema. ?Medications given: 80 mg IV Lasix, 0.5 IV Dilaudid ? ?Hospitalist admitting the patient to stepdown unit, PCCM asked to consult for assistance with acute on chronic hypoxic respiratory failure requiring BiPAP. ? ?Pertinent  Medical History  ?COPD requiring 4 L of oxygen chronically ?ESRD on hemodialysis ?HFrEF (LVEF 40 to 45%) >> 50-55% ?Ischemic cardiomyopathy ?Coronary artery disease ?Hypertension ? ?Micro Data:  ?4/17: SARS-CoV-2 and influenza PCR>>negative ?4/21: Respiratory>> pending ? ?Antimicrobials:  ?Cefepime 04/21>> ? ?Significant Hospital Events: ?Including procedures, antibiotic start and stop dates in addition to other pertinent events   ?4/17: Presented to ED with shortness of breath, placed on BiPAP.  Admitted by the hospitalist, PCCM consulted.  To receive 1 unit of blood with hemodialysis today due to anemia pt does take plavix ?4/18: PCCM team signed off  ?4/18: GI consulted  ?4/21: Upper endoscopy revealed normal esophagus. Normal examined duodenum. 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 Treated with argon beam coagulation. No specimens collected. ?4/21: Rapid response called during hemodialysis pt developed severe hypoxic respiratory failure minimally responsive requiring transfer to ICU for emergent mechanical intubation  ?4/22: Will perform SBT with plans for possible extubation following hemodialysis with fluid removal  ?4/23: Tolerating SBT, dialyzed yesterday evening, plan for extubation today ~ EXTUBATED ?4/24: Intermittent BiPAP with Precedex PRN for anxiety, plan for HD, consult IR for evaluation for Thoracentesis of bilateral pleural effusions.  High risk for reintubation ? ?Interim History / Subjective:  ?-Extubated yesterday, tolerated  BiPAP overnight, was transitioned to [place] this  morning ~ upon exam denies SOB, chest pain, N/V.  Pt has no recollection of events that happened while intubated, concerned about what plan is and he will recover ?-However later this morning became extremely anxious and reporting SOB/"can't breathe" ~ placed back on BiPAP and precedex as needed ?-Plan for HD today, will consult IR to evaluation of Thoracentesis ?-Afebrile, hemodynamically stable, NO vasopressors ?-Tracheal aspirate still pending, will continue Cefepime in the interim  ? ? ?Objective   ?Blood pressure (!) 145/63, pulse 69, temperature 97.9 ?F (36.6 ?C), temperature source Axillary, resp. rate 18, height '5\' 11"'$  (1.803 m), weight 96.4 kg, SpO2 99 %. ?   ?Vent Mode: PSV;CPAP ?FiO2 (%):  [30 %-50 %] 50 % ?PEEP:  [5 cmH20] 5 cmH20 ?Pressure Support:  [5 cmH20-8 cmH20] 5 cmH20  ? ?Intake/Output Summary (Last 24 hours) at 08/04/2021 0745 ?Last data filed at 08/03/2021 1500 ?Gross per 24 hour  ?Intake 178.53 ml  ?Output --  ?Net 178.53 ml  ? ? ?Filed Weights  ? 08/01/21 1735 08/01/21 1749 08/02/21 1713  ?Weight: 90.9 kg 99.6 kg 96.4 kg  ? ? ?Examination: ?General: Acute on chronically ill-appearing male, awake, on [place] 4L, in NAD ?HEENT: JVD present, supple  ?Lungs: Clear breath sounds bilaterally, faint bibasilar crackles, even, nonlabored ?Cardiovascular: Paced rhythm, no murmurs, rubs, gallops, 2+ radial/1+ distal pulses, 2+ bilateral lower extremity edema  ?Abdomen: + BS x4, obese, soft, non distended , no guarding or rebound tenderness ?Extremities: Normal tone  ?Neuro: Awake and alert, oriented to person and place, following commands, no focal deficits, PERRL ?GU: Patient is oliguric at baseline ? ?Resolved Hospital Problem list   ? ? ?Assessment & Plan:  ? ?Acute on chronic hypoxic respiratory failure secondary to HCAP, pulmonary edema, and bilateral pleural effusions  ?-Extubated 4/23 ?-Supplemental O2 as needed to maintain O2 sats >92% ?-BiPAP qhs and  prn ?-High risk for reintubation ?-Follow intermittent Chest X-ray & ABG as needed ?-Bronchodilators  ?-ABX as above, Sputum culture from 4/21 still pending ?-Volume removal with Hemodialysis ~ plan for HD today 4/24 ?-Pulmonary toilet as able ?-CXR 4/24 with stable bilateral small pleural effusions ~ will consult IR 4/24 to evaluate for Thoracentesis ? ?Symptomatic Anemia s/p EGD 04/21 revealed three non-bleeding angioectasias in the stomach. Treated with argon plasma coagulation (APC) ?-Monitor for S/Sx of bleeding ?-Trend CBC, H&H stable today ?-SCD's for VTE Prophylaxis  ?-Transfuse for Hgb <7 ?-Continue PPI ?-GI evaluated and signed off 4/22 ~ "Do not recommend any further endoscopic evaluation at this time.  Patient is at risk for small bowel AVMs and recurrent bleeding in setting of ESRD as well as antiplatelet therapy.  Can pursue further endoscopic evaluation if patient develops recurrent GI bleed." ? ?Mildly Elevated Troponin, suspect demand ischemia ?New onset Atrial Fibrillation~resolved  ?Acute on chronic HFrEF (LVEF 40-45% on Echo 10/22) ?PMHx: Ischemic cardiomyopathy ?-Continuous cardiac monitoring ?-Maintain MAP >65 ?-Echo results show improved LVEF to 50 to 55%, there are wall motion abnormalities ?-No anticoagulation or antiplatelets given anemia ?-Hold outpatient antihypertensives due to soft bp readings  ?-Cardiology following, appreciate input ? ?ESRD on Hemodialysis ?Hypomagnesemia ~ RESOLVED ?-Monitor I&O's / urinary output ?-Follow BMP ?-Ensure adequate renal perfusion ?-Avoid nephrotoxic agents as able ?-Replace electrolytes as indicated ?-Nephrology following, appreciate input~HD as per Nephrology ? ? ?Pt is critically ill, high risk for decompensation and need for reintubation, cardiac arrest, and death.   ? ?Best Practice (right click and "Reselect all SmartList Selections" daily)  ? ?Diet/type: NPO ?DVT prophylaxis:  SCD ?GI prophylaxis: PPI ?Lines: N/A ?Foley:  N/A ?Code Status:  Partial  Code (ONLY intubation) ?Last date of multidisciplinary goals of care discussion [4/24] ? ? ?Labs   ?CBC: ?Recent Labs  ?Lab 07/28/21 ?0824 07/28/21 ?1549 07/31/21 ?0617 08/01/21 ?1443 08/02/21 ?1540 08/03/21 ?04

## 2021-08-04 NOTE — Progress Notes (Signed)
SLP Cancellation Note ? ?Patient Details ?Name: Peter [name] ?MRN: [id number] ?DOB: 07-20-1940 ? ? ?Cancelled treatment:       Reason Eval/Treat Not Completed: Medical issues which prohibited therapy  ? ?SLP consult received and appreciated. Chart review completed. Consulted with RN. Observed pt. Pt holding throat with c/o SOB. Will defer clinical swallowing evaluation at this time. Will continue efforts as appropriate. RN aware and in agreement. ? ?  ?Cherrie Gauze, M.S., CCC-SLP ?Speech-Language Pathologist ?[facility] ?[phone] ([place]) ? ?Peter [name] ?08/04/2021, 8:31 AM ?

## 2021-08-04 NOTE — Plan of Care (Signed)
? ?  Interdisciplinary Goals of Care Family Meeting ? ? ?Date carried out: 08/04/2021 ? ?Location of the meeting: Phone conference ? ?Member's involved: Physician and Family Member or next of kin ? ? ?Code status: INTUBATE ONLY, NO CPR,NO SHOCK, CAN USE PRESSORS ? ?Disposition: Continue current acute care ? ? ?GOALS OF CARE DISCUSSION ? ?The Clinical status was relayed to family in detail- ? ?Updated and notified of patients medical condition- ?Patient remains agitated   ?Patient with increased WOB and using accessory muscles to breathe ?Placed on biPAP and Precedex ? ?Explained to family course of therapy and the modalities  ? ?Patient with Progressive multiorgan failure with a very high probablity of a very minimal chance of meaningful recovery despite all aggressive and optimal medical therapy.  ?PATIENT REMAINS LIMITED CODE-OK TO INTUBATE, NO CPR ? ?Family understands the situation. ?Plan for bedside face to face meeting ?Plan for Palliative care consultation ? ?Family are satisfied with Plan of action and management. All questions answered ? ?Additional CC time 25 mins ? ? ?Corrin Parker, M.D.  ?[facility]  ?Medical Director [place] ?Medical Director [facility] Cardio-Pulmonary Department  ? ? ?

## 2021-08-04 NOTE — Progress Notes (Signed)
OT Cancellation Note ? ?Patient Details ?Name: Peter [name] ?MRN: [id number] ?DOB: [birth year] ? ? ?Cancelled Treatment:    Reason Eval/Treat Not Completed: Other (comment) (Per discussion with PT, pt is appearing anxious in bed, and bedside nurse requested to hold therapy. Pt is also scheduled for HD on this date. OT will hold at this time and re-attempt as able.) ? ?Shanon Payor, OTD OTR/L  ?08/04/21, 10:48 AM  ?

## 2021-08-04 NOTE — Progress Notes (Signed)
?  Hemodialysis Post Treatment Note: ?  ?Treatment date: 08/04/2021 ?  ?Treatment time:  3 hours  ?  ?Access:  CVC ?  ?UF Removed: 1501 ml ?  ?Next Treatment: 08/06/21 ?  ?Patient started dialysis as ordered. Patient tolerated treatment well. No adverse effects noted or reported. V/S stable. Slight increase in confusion noted toward the end of treatment. Daughter and family in room visiting patient at end of treatment. Report given to floor nurse Marrianne Mood, RN. ?

## 2021-08-04 NOTE — Progress Notes (Signed)
[name] ? ? ? ?SUBJECTIVE: Patient complains of shortness of breath dyspnea hypoxemia denies any pain generalized weakness fatigue with respiratory failure ? ? ?Vitals:  ? 08/04/21 0900 08/04/21 0932 08/04/21 1000 08/04/21 1100  ?BP: (!) 152/70 (!) 157/70 (!) 156/65 135/66  ?Pulse: 78 67 65 62  ?Resp: (!) '29 15 12 16  '$ ?Temp:      ?TempSrc:      ?SpO2: 92% 100% 100% 99%  ?Weight:      ?Height:      ? ? ? ?Intake/Output Summary (Last 24 hours) at 08/04/2021 1203 ?Last data filed at 08/04/2021 1100 ?Gross per 24 hour  ?Intake 282.58 ml  ?Output --  ?Net 282.58 ml  ? ? ? ? ?PHYSICAL EXAM ? ?General: Well developed, well nourished, in no acute distress ?HEENT:  Normocephalic and atramatic ?Neck:  No JVD.  ?Lungs: Depressed diminished breath sounds bilaterally to auscultation and percussion. ?Heart: HRRR . Normal S1 and S2 without gallops or murmurs.  ?Abdomen: Bowel sounds are positive, abdomen soft and non-tender  ?Msk:  Back normal, normal gait. Normal strength and tone for age. ?Extremities: No clubbing, cyanosis or edema.   ?Neuro: Alert and oriented X 3. ?Psych:  Good affect, responds appropriately ? ? ?LABS: ?Basic Metabolic Panel: ?Recent Labs  ?  08/03/21 ?7564 08/04/21 ?0404  ?NA 135 136  ?K 3.8 4.4  ?CL 98 99  ?CO2 29 27  ?GLUCOSE 72 58*  ?BUN 24* 32*  ?CREATININE 3.43* 4.54*  ?CALCIUM 8.2* 8.2*  ?MG 1.6* 2.3  ?PHOS 2.8 6.2*  ? ?Liver Function Tests: ?Recent Labs  ?  08/03/21 ?0440  ?ALBUMIN 2.8*  ? ?No results for input(s): LIPASE, AMYLASE in the last 72 hours. ?CBC: ?Recent Labs  ?  08/03/21 ?3329 08/04/21 ?0404  ?WBC 8.5 7.7  ?HGB 8.7* 8.3*  ?HCT 27.6* 27.9*  ?MCV 93.2 99.3  ?PLT 160 139*  ? ?Cardiac Enzymes: ?No results for input(s): CKTOTAL, CKMB, CKMBINDEX, TROPONINI in the last 72 hours. ?BNP: ?Invalid input(s): POCBNP ?D-Dimer: ?No results for input(s): DDIMER in the last 72 hours. ?Hemoglobin A1C: ?No results for input(s): HGBA1C in the last 72 hours. ?Fasting Lipid Panel: ?No results for input(s):  CHOL, HDL, LDLCALC, TRIG, CHOLHDL, LDLDIRECT in the last 72 hours. ?Thyroid Function Tests: ?No results for input(s): TSH, T4TOTAL, T3FREE, THYROIDAB in the last 72 hours. ? ?Invalid input(s): FREET3 ?Anemia Panel: ?No results for input(s): VITAMINB12, FOLATE, FERRITIN, TIBC, IRON, RETICCTPCT in the last 72 hours. ? ?DG Chest Port 1 View ? ?Result Date: 08/04/2021 ?CLINICAL DATA:  Pleural effusions.  Asthma.  CHF. EXAM: PORTABLE CHEST 1 VIEW COMPARISON:  08/03/2021 FINDINGS: 0501 hours. Low volume film. The cardio pericardial silhouette is enlarged. Similar bibasilar collapse/consolidation with small bilateral pleural effusions. Right IJ central line remains in place with tip overlying the distal SVC. Endotracheal and NG tubes have been removed in the interval. Telemetry leads overlie the chest. IMPRESSION: 1. Interval extubation. 2. Similar bibasilar collapse/consolidation with small bilateral pleural effusions. Electronically Signed   By: Misty Stanley M.D.   On: 08/04/2021 06:10  ? ?DG Chest Port 1 View ? ?Result Date: 08/03/2021 ?CLINICAL DATA:  Ventilator dependent respiratory failure. EXAM: PORTABLE CHEST 1 VIEW COMPARISON:  Portable chest 08/01/2021. FINDINGS: 5:05 a.m., 08/03/2021. NGT enters the stomach with the intragastric course not included in the field. ETT tip is 6 cm from carina. Right IJ dialysis catheter tip remains at the superior cavoatrial junction level. Left chest dual lead pacing system with AID wiring  is unaltered. The heart is enlarged. Stable mediastinum with aortic atherosclerosis. Central vessels are less distended today. There is mild perihilar vascular congestion, mild interstitial edema with improvement in the edema component which has largely resolved in the upper lung zones and is less pronounced in the central and lower zones. Moderate right and small left pleural effusions continue to be seen with hazy interstitial consolidation in the right-greater-than-left lower lung fields,  again could represent atelectasis or consolidation. Pleural effusions are unchanged. IMPRESSION: 1. Improvement in interstitial edema and perihilar vascular congestion. 2. No interval change in right-greater-than-left pleural effusions and lower zonal lung opacities. Electronically Signed   By: Telford Nab M.D.   On: 08/03/2021 07:23   ? ? ?Echo mildly reduced left ventricular function EF around 45 to 50% ? ?TELEMETRY: Normal sinus rhythm rate of around 70: ? ?ASSESSMENT AND PLAN: ? ?Principal Problem: ?  Acute on chronic respiratory failure with hypoxia ([place]) ?Active Problems: ?  Acute on chronic systolic CHF (congestive heart failure) ([place]) ?  Ischemic cardiomyopathy ?  Elevated troponin ?  Chronic respiratory failure with hypoxia ([facility]) ?  Symptomatic anemia ?  Atrial fibrillation ([place]) ?  ESRD (end stage renal disease) ([place]) ?  COPD (chronic obstructive pulmonary disease) with emphysema ([place]) ?  Depression ?  Gastric AVM ?  ? ?Plan ?Agree with ICU level critical care management ?Continue rate control for atrial fibrillation ?Avoid anticoagulation because of risk of GI bleeding ?Supplemental oxygen ventilatory support as necessary ?Agree with thoracentesis for pleural effusion to help with shortness of breath ?Inhalers as necessary for COPD dyspnea shortness of breath symptoms ?Correct anemia as best we can to maintain hemoglobin above 7 ?History of ischemic cardiomyopathy seems to be improved with left ventricular function close to 50 continue current therapy ?Aggressive dialysis therapy for removal of volume ? ?Yolonda Kida, MD ?08/04/2021 ?12:03 PM ? ? ? ?  ?

## 2021-08-04 NOTE — Progress Notes (Signed)
OT Cancellation Note ? ?Patient Details ?Name: Peter [name] ?MRN: [id number] ?DOB: 02-11-1941 ? ? ?Cancelled Treatment:    Reason Eval/Treat Not Completed: Medical issues which prohibited therapy. OT has attempted eval 3x with pt not available then transferred to ICU and intubated then subsequently extubated. Pt is still not appropriate to attempt mobility due to current medical status. OT will sign off at this time, please re consult if appropriate.  ? ?Shanon Payor, OTD OTR/L  ?08/04/21, 4:20 PM  ?

## 2021-08-05 ENCOUNTER — Inpatient Hospital Stay: Payer: PPO

## 2021-08-05 DIAGNOSIS — J9621 Acute and chronic respiratory failure with hypoxia: Secondary | ICD-10-CM | POA: Diagnosis not present

## 2021-08-05 DIAGNOSIS — D631 Anemia in chronic kidney disease: Secondary | ICD-10-CM | POA: Diagnosis not present

## 2021-08-05 DIAGNOSIS — I132 Hypertensive heart and chronic kidney disease with heart failure and with stage 5 chronic kidney disease, or end stage renal disease: Secondary | ICD-10-CM | POA: Diagnosis not present

## 2021-08-05 DIAGNOSIS — I5022 Chronic systolic (congestive) heart failure: Secondary | ICD-10-CM | POA: Diagnosis not present

## 2021-08-05 DIAGNOSIS — I5033 Acute on chronic diastolic (congestive) heart failure: Secondary | ICD-10-CM | POA: Diagnosis not present

## 2021-08-05 DIAGNOSIS — E7800 Pure hypercholesterolemia, unspecified: Secondary | ICD-10-CM | POA: Diagnosis not present

## 2021-08-05 DIAGNOSIS — Z7189 Other specified counseling: Secondary | ICD-10-CM | POA: Diagnosis not present

## 2021-08-05 DIAGNOSIS — I2510 Atherosclerotic heart disease of native coronary artery without angina pectoris: Secondary | ICD-10-CM | POA: Diagnosis not present

## 2021-08-05 DIAGNOSIS — N186 End stage renal disease: Secondary | ICD-10-CM | POA: Diagnosis not present

## 2021-08-05 LAB — BASIC METABOLIC PANEL
Anion gap: 11 (ref 5–15)
BUN: 21 mg/dL (ref 8–23)
CO2: 25 mmol/L (ref 22–32)
Calcium: 8.2 mg/dL — ABNORMAL LOW (ref 8.9–10.3)
Chloride: 98 mmol/L (ref 98–111)
Creatinine, Ser: 3.39 mg/dL — ABNORMAL HIGH (ref 0.61–1.24)
GFR, Estimated: 17 mL/min — ABNORMAL LOW (ref >60)
Glucose, Bld: 108 mg/dL — ABNORMAL HIGH (ref 70–99)
Potassium: 3.6 mmol/L (ref 3.5–5.1)
Sodium: 134 mmol/L — ABNORMAL LOW (ref 135–145)

## 2021-08-05 LAB — CULTURE, RESPIRATORY W GRAM STAIN: Culture: NORMAL

## 2021-08-05 LAB — BLOOD GAS, ARTERIAL
Acid-Base Excess: 7.1 mmol/L — ABNORMAL HIGH (ref 0.0–2.0)
Bicarbonate: 32.5 mmol/L — ABNORMAL HIGH (ref 20.0–28.0)
FIO2: 32 %
O2 Saturation: 75.4 %
Patient temperature: 37
pCO2 arterial: 49 mmHg — ABNORMAL HIGH (ref 32–48)
pH, Arterial: 7.43 (ref 7.35–7.45)
pO2, Arterial: 47 mmHg — ABNORMAL LOW (ref 83–108)

## 2021-08-05 LAB — MAGNESIUM: Magnesium: 2 mg/dL (ref 1.7–2.4)

## 2021-08-05 LAB — CBC
HCT: 29.3 % — ABNORMAL LOW (ref 39.0–52.0)
Hemoglobin: 9.3 g/dL — ABNORMAL LOW (ref 13.0–17.0)
MCH: 29.4 pg (ref 26.0–34.0)
MCHC: 31.7 g/dL (ref 30.0–36.0)
MCV: 92.7 fL (ref 80.0–100.0)
Platelets: 124 10*3/uL — ABNORMAL LOW (ref 150–400)
RBC: 3.16 MIL/uL — ABNORMAL LOW (ref 4.22–5.81)
RDW: 15 % (ref 11.5–15.5)
WBC: 6.9 10*3/uL (ref 4.0–10.5)
nRBC: 0 % (ref 0.0–0.2)

## 2021-08-05 LAB — GLUCOSE, CAPILLARY
Glucose-Capillary: 105 mg/dL — ABNORMAL HIGH (ref 70–99)
Glucose-Capillary: 104 mg/dL — ABNORMAL HIGH (ref 70–99)

## 2021-08-05 LAB — PROTEIN, BODY FLUID (OTHER): Total Protein, Body Fluid Other: 1.4 g/dL

## 2021-08-05 LAB — PHOSPHORUS: Phosphorus: 4.4 mg/dL (ref 2.5–4.6)

## 2021-08-05 MED ORDER — DEXTROSE 5 % IV SOLN: INTRAVENOUS | Status: DC

## 2021-08-05 MED ORDER — ACETAMINOPHEN 325 MG PO TABS: 650.0000 mg | ORAL_TABLET | Freq: Four times a day (QID) | ORAL | Status: DC | PRN

## 2021-08-05 MED ORDER — DIPHENHYDRAMINE HCL 50 MG/ML IJ SOLN: 25.0000 mg | INTRAMUSCULAR | Status: DC | PRN

## 2021-08-05 MED ORDER — MORPHINE SULFATE (PF) 2 MG/ML IV SOLN: 2.0000 mg | INTRAVENOUS | Status: DC | PRN

## 2021-08-05 MED ORDER — GLYCOPYRROLATE 0.2 MG/ML IJ SOLN
0.2000 mg | INTRAMUSCULAR | Status: DC | PRN
Start: 2021-08-05 — End: 2021-08-08
  Filled 2021-08-05: qty 1

## 2021-08-05 MED ORDER — MIDAZOLAM HCL 2 MG/2ML IJ SOLN: 2.0000 mg | INTRAMUSCULAR | Status: DC | PRN

## 2021-08-05 MED ORDER — DIAZEPAM 5 MG/ML IJ SOLN
5.0000 mg | Freq: Once | INTRAMUSCULAR | Status: AC
  Administered 2021-08-05: 5 mg via INTRAVENOUS
  Filled 2021-08-05: qty 2

## 2021-08-05 MED ORDER — ACETAMINOPHEN 650 MG RE SUPP: 650.0000 mg | Freq: Four times a day (QID) | RECTAL | Status: DC | PRN

## 2021-08-05 MED ORDER — GLYCOPYRROLATE 1 MG PO TABS
1.0000 mg | ORAL_TABLET | ORAL | Status: DC | PRN
  Filled 2021-08-05: qty 1

## 2021-08-05 MED ORDER — GLYCOPYRROLATE 0.2 MG/ML IJ SOLN
0.2000 mg | INTRAMUSCULAR | Status: DC | PRN
  Filled 2021-08-05: qty 1

## 2021-08-05 MED ORDER — POLYVINYL ALCOHOL 1.4 % OP SOLN
1.0000 [drp] | Freq: Four times a day (QID) | OPHTHALMIC | Status: DC | PRN
  Filled 2021-08-05: qty 15

## 2021-08-05 NOTE — Progress Notes (Signed)
0705: Patient in bed asleep on bipap.   ? ?9628: CT head ordered, RT avail to take patient to CT after AM rounds (approx 1030). CT tech made aware. ICU NP aware, okay to wean dex to keep patient calm and increase prior to transporting to CT.  ? ?1033: Patient transported to [facility] by transport, RN, & RT. Patient tolerated transfer and CT without issue.  ? ?1200: Patient daughter and granddaughter arrived, palliative care  NP bedside and provided update on overnight events.  ? ? ?1430: Patient family, palliative NP, and ICU MD bedside discussing patient plan and goals of care. Orders to be placed. ? ?1500: Patient requesting "hot dog with chili and mustard and a coke." Still has NPO order, NP made aware, orders to be placed. Okay to for patient to eat and drink as he wishes.  ? ?1600: Patient alert and visiting with family bedside, states he is comfortable and has no requests at this time. Telesitter discontinued as family is bedside and patient calm and cooperative.  ? ?1750: Patient in bed with no complaints family and friends bedside with no requests at this time.  ? ? ?

## 2021-08-05 NOTE — Progress Notes (Signed)
Pt incredibly restless and demonstrating AH/VH by yelling aloud to persons not present in the room. When addressed by staff pt became guarded/agitated with staff.  ? ?This RN returned for re-evaluation at this time. Pt presenting with clenched fists and defensive posturing. Writer attempted to evaluate pt's change in behavior and found the pt to have ripped the entire IV line in half. Pt unwilling to follow simple commands and presenting obvious safety concerns.  ? ?Pt stating that he is being "held against his will: and voicing paranoia of this RN and the intentions of additional staff. Pt refusing staff assistance and removing medical monitoring equipment.  ? ?See MAR for intervention. ?Valium given at 2108. ?

## 2021-08-05 NOTE — Progress Notes (Signed)
?[facility]  ?ROUNDING NOTE  ? ?Subjective:  ? ?Peter [name] is a 81 year old male with past medical conditions including chronic systolic heart failure with EF 40 to 45%, chronic respiratory failure on 4 L nasal cannula, COPD, hypertension, CAD, and end-stage renal disease on hemodialysis.  Patient presents to the emergency department with complaints of shortness of breath progressively worse since yesterday.  Patient has been admitted for Acute respiratory failure ([place]) [J96.00] ?ESRD (end stage renal disease) ([place]) [N18.6] ?Symptomatic anemia [D64.9] ?Acute on chronic respiratory failure with hypoxemia ([place]) [J96.21] ? ?Patient is known to our practice and receives outpatient dialysis at [facility] on a MWF schedule, supervised by Dr. Candiss Norse.  ? ?Update: ?Patient resting quietly, safety sitter at bedside ?Chart review reports increased confusion and delirium overnight ?Bipap in place ? ?Dialysis yesterday with 1.5L removed ? ?Objective:  ?Vital signs in last 24 hours:  ?Temp:  [96.2 ?F (35.7 ?C)-98.3 ?F (36.8 ?C)] 96.2 ?F (35.7 ?C) (04/25 0800) ?Pulse Rate:  [46-134] 57 (04/25 1200) ?Resp:  [8-24] 14 (04/25 1200) ?BP: (124-173)/(55-133) 173/133 (04/25 1200) ?SpO2:  [86 %-100 %] 96 % (04/25 1200) ?FiO2 (%):  [30 %-50 %] 50 % (04/25 1155) ?Weight:  [90.9 kg-92.3 kg] 90.9 kg (04/24 1851) ? ?Weight change:  ?Filed Weights  ? 08/02/21 1713 08/04/21 1458 08/04/21 1851  ?Weight: 96.4 kg 92.3 kg 90.9 kg  ? ? ?Intake/Output: ?I/O last 3 completed shifts: ?In: 424.2 [I.V.:323.8; IV Piggyback:100.3] ?Out: 1501 [Other:1501] ?  ?Intake/Output this shift: ? Total I/O ?In: 78.2 [I.V.:78.2] ?Out: -  ? ?Physical Exam: ?General: Critically ill appearing, anxious  ?Head: Normocephalic, atraumatic. Moist oral mucosal membranes  ?Eyes: Anicteric  ?Lungs:  Normal effort, scattered rhonchi  ?Heart: Irregular rhythm  ?Abdomen:  Soft, nontender, nondistended  ?Extremities: Trace peripheral edema.  ?Neurologic:  Nonfocal, moving all four extremities  ?Skin: No lesions  ?Access: Right PermCath  ? ? ?Basic Metabolic Panel: ?Recent Labs  ?Lab 08/01/21 ?0238 08/02/21 ?6606 08/03/21 ?3016 08/04/21 ?0404 08/05/21 ?0841  ?NA 136 136 135 136 134*  ?K 4.1 4.3 3.8 4.4 3.6  ?CL 98 98 98 99 98  ?CO2 31 33* '29 27 25  '$ ?GLUCOSE 102* 81 72 58* 108*  ?BUN 43* 28* 24* 32* 21  ?CREATININE 4.44* 3.45* 3.43* 4.54* 3.39*  ?CALCIUM 8.3* 8.1* 8.2* 8.2* 8.2*  ?MG 2.0 1.9 1.6* 2.3 2.0  ?PHOS 3.3 3.5 2.8 6.2* 4.4  ? ? ? ?Liver Function Tests: ?Recent Labs  ?Lab 08/03/21 ?0109 08/04/21 ?1527  ?PROT  --  4.9*  ?ALBUMIN 2.8*  --   ? ? ?No results for input(s): LIPASE, AMYLASE in the last 168 hours. ?No results for input(s): AMMONIA in the last 168 hours. ? ?CBC: ?Recent Labs  ?Lab 08/01/21 ?0238 08/02/21 ?3235 08/03/21 ?5732 08/04/21 ?0404 08/05/21 ?0841  ?WBC 6.8 7.2 8.5 7.7 6.9  ?HGB 7.9* 8.3* 8.7* 8.3* 9.3*  ?HCT 24.9* 26.8* 27.6* 27.9* 29.3*  ?MCV 92.6 96.1 93.2 99.3 92.7  ?PLT 127* 141* 160 139* 124*  ? ? ? ?Cardiac Enzymes: ?No results for input(s): CKTOTAL, CKMB, CKMBINDEX, TROPONINI in the last 168 hours. ? ?BNP: ?Invalid input(s): POCBNP ? ?CBG: ?Recent Labs  ?Lab 08/04/21 ?1637 08/04/21 ?2031 08/04/21 ?2316 08/05/21 ?0729 08/05/21 ?1119  ?GLUCAP 90 80 86 105* 104*  ? ? ? ?Microbiology: ?Results for orders placed or performed during the hospital encounter of 07/28/21  ?Resp Panel by RT-PCR (Flu A&B, Covid) Nasopharyngeal Swab     Status: None  ?  Collection Time: 07/28/21  8:24 AM  ? Specimen: Nasopharyngeal Swab; Nasopharyngeal(NP) swabs in vial transport medium  ?Result Value Ref Range Status  ? SARS Coronavirus 2 by RT PCR NEGATIVE NEGATIVE Final  ?  Comment: (NOTE) ?SARS-CoV-2 target nucleic acids are NOT DETECTED. ? ?The SARS-CoV-2 RNA is generally detectable in upper respiratory ?specimens during the acute phase of infection. The lowest ?concentration of SARS-CoV-2 viral copies this assay can detect is ?138 copies/mL. A negative result  does not preclude SARS-Cov-2 ?infection and should not be used as the sole basis for treatment or ?other patient management decisions. A negative result may occur with  ?improper specimen collection/handling, submission of specimen other ?than nasopharyngeal swab, presence of viral mutation(s) within the ?areas targeted by this assay, and inadequate number of viral ?copies(<138 copies/mL). A negative result must be combined with ?clinical observations, patient history, and epidemiological ?information. The expected result is Negative. ? ?Fact Sheet for Patients:  ?EntrepreneurPulse.com.au ? ?Fact Sheet for Healthcare Providers:  ?IncredibleEmployment.be ? ?This test is no t yet approved or cleared by the [place] FDA and  ?has been authorized for detection and/or diagnosis of SARS-CoV-2 by ?FDA under an Emergency Use Authorization (EUA). This EUA will remain  ?in effect (meaning this test can be used) for the duration of the ?COVID-19 declaration under Section 564(b)(1) of the Act, 21 ?U.S.C.section 360bbb-3(b)(1), unless the authorization is terminated  ?or revoked sooner.  ? ? ?  ? Influenza A by PCR NEGATIVE NEGATIVE Final  ? Influenza B by PCR NEGATIVE NEGATIVE Final  ?  Comment: (NOTE) ?The Xpert Xpress SARS-CoV-2/FLU/RSV plus assay is intended as an aid ?in the diagnosis of influenza from Nasopharyngeal swab specimens and ?should not be used as a sole basis for treatment. Nasal washings and ?aspirates are unacceptable for Xpert Xpress SARS-CoV-2/FLU/RSV ?testing. ? ?Fact Sheet for Patients: ?EntrepreneurPulse.com.au ? ?Fact Sheet for Healthcare Providers: ?IncredibleEmployment.be ? ?This test is not yet approved or cleared by the [place] FDA and ?has been authorized for detection and/or diagnosis of SARS-CoV-2 by ?FDA under an Emergency Use Authorization (EUA). This EUA will remain ?in effect (meaning this test can be used) for  the duration of the ?COVID-19 declaration under Section 564(b)(1) of the Act, 21 U.S.C. ?section 360bbb-3(b)(1), unless the authorization is terminated or ?revoked. ? ?Performed at [facility], [place], ?[place] [address] ?  ?MRSA Next Gen by PCR, Nasal     Status: None  ? Collection Time: 07/30/21 12:21 PM  ? Specimen: Nasal Mucosa; Nasal Swab  ?Result Value Ref Range Status  ? MRSA by PCR Next Gen NOT DETECTED NOT DETECTED Final  ?  Comment: (NOTE) ?The GeneXpert MRSA Assay (FDA approved for NASAL specimens only), ?is one component of a comprehensive MRSA colonization surveillance ?program. It is not intended to diagnose MRSA infection nor to guide ?or monitor treatment for MRSA infections. ?Test performance is not FDA approved in patients less than 2 years ?old. ?Performed at [facility], [place], ?[place] [address] ?  ?Culture, Respiratory w Gram Stain     Status: None  ? Collection Time: 08/01/21  6:06 PM  ? Specimen: SPU; Respiratory  ?Result Value Ref Range Status  ? Specimen Description   Final  ?  SPUTUM ?Performed at [facility], [address], [place], [place] [address] ?  ? Special Requests CULTURE  Final  ? Gram Stain   Final  ?  ABUNDANT WBC PRESENT, PREDOMINANTLY PMN ?NO ORGANISMS  SEEN ?  ? Culture   Final  ?  RARE Normal respiratory flora-no Staph aureus or Pseudomonas seen ?Performed at [facility], [place] [address] [place], [place] [address] ?  ? Report Status 08/05/2021 FINAL  Final  ?Body fluid culture w Gram Stain     Status: None (Preliminary result)  ? Collection Time: 08/04/21 12:12 PM  ? Specimen: PATH Cytology Pleural fluid  ?Result Value Ref Range Status  ? Specimen Description   Final  ?  PLEURAL ?Performed at [facility], [address] [place], [place] [address] ?  ? Special Requests   Final  ?  NONE ?Performed at [facility], [address] [place], [place] [address] ?  ? Gram Stain NO WBC  SEEN ?NO ORGANISMS SEEN ?  Final  ? Culture   Final  ?  NO GROWTH < 24 HOURS ?Performed at [facility], [place] [address], [place],  [address] ?  ? Report Status PENDING  Incomplete  ? ? ?Coagula

## 2021-08-05 NOTE — Progress Notes (Signed)
Valium found to be effective but short lived. Pt placed onto BiPAP in meantime post medication administration.  ? ?2213: Pt found to have removed BiPAP and seen sitting on the edge of the bed attempting to walk. Additional staff requested for assistance. NP Lenord Carbo notified of events leading up to now.  ? ?4LNC replaced onto pt and pt repositioned back into bed. Pt willing to follow simple commands but incredibly impulsive with poor judgement. ? ?This RN sat at bedside as a sitter with lights dimmed to promote relaxation. Pt still attempting to get out of bed. Charge RN Jinny Blossom made aware. ?

## 2021-08-05 NOTE — Progress Notes (Signed)
? ?NAME:  Peter [name], MRN:  [id number], DOB:  [birth year], LOS: 8 ?ADMISSION DATE:  07/28/2021, CONSULTATION DATE:  07/28/2021 ?REFERRING MD:  Dr.Agbata, CHIEF COMPLAINT:  Shortness of Breath  ? ?Brief Pt Description / Synopsis:  ?81 y.o. Male admitted with Acute on Chronic Hypoxic Respiratory Failure in the setting of Symptomatic Anemia, volume overload, atelectasis, & questionable AECOPD requiring BiPAP. ? ?History of Present Illness:  ?Peter [name] is a 81 year old male with a past medical history significant for COPD requiring 4 L of supplemental O2 chronically, ESRD on hemodialysis, HFrEF (LVEF 40 to 45%), ischemic cardiomyopathy, hypertension, coronary artery disease who presented to [facility] ED on 07/28/2021 due to progressive shortness of breath and fatigue/weakness. ? ?He reports that at baseline he is usually short of breath, however this is worsened over the last 2 to 3 days.  He reports associated wheezing and nonproductive cough.  He had his pulse ox checked which was 80% on 4 L.  He denies fever or chills, worsening of bilateral lower extremity edema, chest pain, nausea, vomiting, diarrhea, abdominal pain, palpitations..  He does report symptoms are worse when laying flat and with any form of exertion.  He reports he has been compliant and not missed any scheduled dialysis appointment, which she completed his last session on Friday 4/14.  He reported dark stools, but denied hematochezia or hematemesis. ? ?EMS gave Solu-Medrol in route to the ED.  Given his work of breathing, he was placed on BiPAP in the ED. ? ?ED Course: ?Initial vital signs: Temperature 97.6, respiratory rate 20, pulse 80, blood pressure 147/90, 96% ?Significant Labs: Glucose 109, BUN 38, creatinine 4.93, BNP 3382, high-sensitivity troponin 269, procalcitonin less than 0.1, WBC 8.2, hemoglobin 6.7, hematocrit 21.7 ?COVID-19 PCR negative ?Imaging: ?Chest x-ray>>IMPRESSION: ?1. Increased opacity at the RIGHT lung base. This could  represent ?pneumonia, aspiration, atelectasis or asymmetric pulmonary edema. ?Favor atelectasis given the elevation of the RIGHT hemidiaphragm ?suggesting associated volume loss. ?2. Chronic interstitial lung disease/fibrosis. ?3. Emphysema. ?Medications given: 80 mg IV Lasix, 0.5 IV Dilaudid ? ?Hospitalist admitting the patient to stepdown unit, PCCM asked to consult for assistance with acute on chronic hypoxic respiratory failure requiring BiPAP. ? ?Pertinent  Medical History  ?COPD requiring 4 L of oxygen chronically ?ESRD on hemodialysis ?HFrEF (LVEF 40 to 45%) >> 50-55% ?Ischemic cardiomyopathy ?Coronary artery disease ?Hypertension ? ?Micro Data:  ?4/17: SARS-CoV-2 and influenza PCR>>negative ?4/19: MRSA PCR>negative  ?4/21: Respiratory>>pending ?4/24: Body fluid>> ? ?Antimicrobials:  ?Cefepime 04/21>>04/24 ? ?Significant Hospital Events: ?Including procedures, antibiotic start and stop dates in addition to other pertinent events   ?4/17: Presented to ED with shortness of breath, placed on BiPAP.  Admitted by the hospitalist, PCCM consulted.  To receive 1 unit of blood with hemodialysis today due to anemia pt does take plavix ?4/18: PCCM team signed off  ?4/18: GI consulted  ?4/21: Upper endoscopy revealed normal esophagus. Normal examined duodenum. 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 Treated with argon beam coagulation. No specimens collected. ?4/21: Rapid response called during hemodialysis pt developed severe hypoxic respiratory failure minimally responsive requiring transfer to ICU for emergent mechanical intubation  ?4/22: Will perform SBT with plans for possible extubation following hemodialysis with fluid removal  ?4/23: Tolerating SBT, dialyzed yesterday evening, plan for extubation today ~ EXTUBATED ?4/24: Intermittent BiPAP with Precedex PRN for anxiety, plan for HD, consult IR for evaluation for Thoracentesis of bilateral pleural effusions.  High risk for reintubation ?4/25: Overnight  pt with severe confusion and delirium requiring prn  valium and precedex gtt.  ABG revealed: pH 7.43/pCO2 49/pO2 47.  Pt sedated on precedex gtt '@0'$ .8 mcg/kg/hr on Bipap '@50'$ %  ? ?Interim History / Subjective:  ?Pt resting comfortably with sitter at bedside and precedex gtt '@0'$ .8 mcg/kg/hr.  Bipap in place FiO2 '@50'$ % ? ?Objective   ?Blood pressure (!) 168/70, pulse (!) 46, temperature 98.1 ?F (36.7 ?C), temperature source Axillary, resp. rate 15, height '5\' 11"'$  (1.803 m), weight 90.9 kg, SpO2 95 %. ?   ?FiO2 (%):  [30 %-50 %] 50 %  ? ?Intake/Output Summary (Last 24 hours) at 08/05/2021 0816 ?Last data filed at 08/05/2021 0457 ?Gross per 24 hour  ?Intake 364.96 ml  ?Output 1501 ml  ?Net -1136.04 ml  ? ?Filed Weights  ? 08/02/21 1713 08/04/21 1458 08/04/21 1851  ?Weight: 96.4 kg 92.3 kg 90.9 kg  ? ? ?Examination: ?General: Acute on chronically ill-appearing male, sedated NAD on Bipap ?HEENT: JVD present, supple  ?Lungs: Faint rhonchi throughout, even, nonlabored ?Cardiovascular: Paced rhythm, no murmurs, rubs, gallops, 2+ radial/1+ distal pulses, trace bilateral lower extremity edema  ?Abdomen: + BS x4, obese, soft, non distended , no guarding or rebound tenderness ?Extremities: Normal bulk and tone ?Neuro: Sedated, not following commands/withdraws from painful stimulation, PERRL  ?GU: Patient is oliguric at baseline ? ?Resolved Hospital Problem list   ? ? ?Assessment & Plan:  ? ?Acute on chronic hypoxic respiratory failure secondary to HCAP, pulmonary edema, and bilateral pleural effusions  ?Extubated 4/23 s/p right-sided thoracentesis with removal of 900 ml of pleural fluid ?-Supplemental O2 as needed to maintain O2 sats >92% ?-BiPAP qhs and prn ?-High risk for reintubation ?-Follow intermittent Chest X-ray & ABG as needed ?-Bronchodilators  ?-Pulmonary toilet as able ? ?Symptomatic Anemia s/p EGD 04/21 revealed three non-bleeding angioectasias in the stomach. Treated with argon plasma coagulation (APC) ?-Monitor for S/Sx  of bleeding ?-Trend CBC, H&H stable today ?-SCD's for VTE Prophylaxis  ?-Transfuse for Hgb <7 ?-Continue PPI ?-GI evaluated and signed off 4/22 ~ "Do not recommend any further endoscopic evaluation at this time.  Patient is at risk for small bowel AVMs and recurrent bleeding in setting of ESRD as well as antiplatelet therapy.  Can pursue further endoscopic evaluation if patient develops recurrent GI bleed." ? ?Mildly Elevated Troponin, suspect demand ischemia ?New onset Atrial Fibrillation~resolved  ?Acute on chronic HFrEF (LVEF 40-45% on Echo 10/22) ?PMHx: Ischemic cardiomyopathy ?-Continuous cardiac monitoring ?-Maintain MAP >65 ?-Echo results show improved LVEF to 50 to 55%, there are wall motion abnormalities ?-No anticoagulation or antiplatelets given anemia ?-Hold outpatient antihypertensives due to soft bp readings  ?-Cardiology following, appreciate input ? ?ESRD on Hemodialysis ?Hypomagnesemia ~ RESOLVED ?-Monitor I&O's / urinary output ?-Follow BMP ?-Ensure adequate renal perfusion ?-Avoid nephrotoxic agents as able ?-Replace electrolytes as indicated ?-Nephrology following, appreciate input~HD as per Nephrology ? ?Acute encephalopathy concerning for possible ICU delirium  ?-Frequent reorientation  ?-Prn valium and precedex gtt for agitation/delirium ?-Will order CT Head  ? ? ?Pt is critically ill, high risk for decompensation and need for reintubation, cardiac arrest, and death.   ?Best Practice (right click and "Reselect all SmartList Selections" daily)  ? ?Diet/type: NPO ?DVT prophylaxis: SCD ?GI prophylaxis: PPI ?Lines: N/A ?Foley:  N/A ?Code Status:  Partial Code (ONLY intubation) ?Last date of multidisciplinary goals of care discussion [08/05/21] ? ?Pts daughter Julian Reil updated via telephone regarding plan of care and pts condition.  All questions were answered on 04/25 ?Labs   ?CBC: ?Recent Labs  ?Lab 07/31/21 ?0617 08/01/21 ?  7207 08/02/21 ?2182 08/03/21 ?8833 08/04/21 ?0404  ?WBC 7.2 6.8 7.2  8.5 7.7  ?HGB 8.5* 7.9* 8.3* 8.7* 8.3*  ?HCT 26.3* 24.9* 26.8* 27.6* 27.9*  ?MCV 91.0 92.6 96.1 93.2 99.3  ?PLT 133* 127* 141* 160 139*  ? ? ?Basic Metabolic Panel: ?Recent Labs  ?Lab 07/31/21 ?0617 08/01/21 ?

## 2021-08-05 NOTE — Progress Notes (Signed)
Pt taken to CT on BIPAP and back to CCU with no complications.  ?

## 2021-08-05 NOTE — Progress Notes (Signed)
PT Cancellation Note ? ?Patient Details ?Name: Peter [name] ?MRN: [id number] ?DOB: [birth year] ? ? ?Cancelled Treatment:    Reason Eval/Treat Not Completed: Medical issues which prohibited therapy. Patient with with severe confusion and delirium overnight requiring prn valium and precedex gtt. Head CT also pending. PT will hold at this time and follow up as patient able to participate.  ? ?Minna Merritts, PT, MPT ? ?[name] ?08/05/2021, 10:11 AM ?

## 2021-08-05 NOTE — Progress Notes (Signed)
? ?                                                                                                                                                     ?                                                   ?Daily Progress Note  ? ?Patient Name: Peter [name]       Date: 08/05/2021 ?DOB: 1941-02-20  Age: 81 y.o. MRN#: [id number] ?Attending Physician: Flora Lipps, MD ?Primary Care Physician: Tracie Harrier, MD ?Admit Date: 07/28/2021 ? ?Reason for Consultation/Follow-up: Establishing goals of care ? ?Subjective: ?Patient is on BIPAP and not responsive to have a conversation. Met with daughter and granddaughter. They discuss that they are considering shifting to comfort care but are concerned with his anxiety about death. Daughter tells me a person came to witness to him and left an evangelical tract on salvation. They state this has given their father doubts and he is afraid to die. They discuss that some of the family does not share patient's faith, but are understanding and respectful of his beliefs and want him to have peace with death. They state the visitor is a friend of the patient's. Inquired if he had a trusted advisor or pastor he would want to speak with. They discuss a number on the board for a pastor named Tommy and request I call him and explain the whole situation to him and ask him to come. I was unable to reach him myself and left a VM for a call back, but he arrived at bedside and went in to speak with the family; I did not speak with him prior to his entry to bedside. Daughter states pastor has spoken with patient and he seems to be more at peace, and they are more at peace. Peter [name], as well as the visitor that brought the tract. Dr. Mortimer Fries in to bedside. Family confirmed request for comfort care.   ? ?Length of Stay: 8 ? ?Current Medications: ?Scheduled Meds:  ? Chlorhexidine Gluconate Cloth  6 each Topical Q0600  ? epoetin (EPOGEN/PROCRIT) injection   8,000 Units Intravenous Q M,W,F-HD  ? sodium chloride flush  10-40 mL Intracatheter Q12H  ? sodium chloride flush  3 mL Intravenous Q12H  ? ? ?Continuous Infusions: ? sodium chloride    ? dexmedetomidine (PRECEDEX) IV infusion 0.1 mcg/kg/hr (08/05/21 1200)  ? ? ?PRN Meds: ?sodium chloride, acetaminophen **OR** acetaminophen, albuterol, diazepam, diphenhydrAMINE, glycopyrrolate **OR** glycopyrrolate **OR** glycopyrrolate, midazolam, morphine injection, [DISCONTINUED] ondansetron **OR** ondansetron (ZOFRAN) IV, polyvinyl alcohol, sodium chloride flush, sodium chloride flush ? ?Physical  Exam ?Constitutional:   ?   Comments: Eyes closed. On BIPAP  ?         ? ?Vital Signs: BP (!) 134/59 (BP Location: Right Arm)   Pulse (!) 57   Temp (!) 96.2 ?F (35.7 ?C) (Axillary)   Resp (!) 22   Ht $R'5\' 11"'nZ$  (1.803 m)   Wt 90.9 kg   SpO2 94%   BMI 27.95 kg/m?  ?SpO2: SpO2: 94 % ?O2 Device: O2 Device: Nasal Cannula ?O2 Flow Rate: O2 Flow Rate (L/min): 4 L/min ? ?Intake/output summary:  ?Intake/Output Summary (Last 24 hours) at 08/05/2021 1625 ?Last data filed at 08/05/2021 1200 ?Gross per 24 hour  ?Intake 356.49 ml  ?Output 1501 ml  ?Net -1144.51 ml  ? ?LBM: Last BM Date : 07/31/21 ?Baseline Weight: Weight: 90 kg ?Most recent weight: Weight: 90.9 kg ? ? ? ?Patient Active Problem List  ? Diagnosis Date Noted  ? 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 Symptomatic anemia 07/28/2021  ? Acute on chronic respiratory failure with hypoxia ([place]) 07/28/2021  ? Atrial fibrillation ([place]) 07/28/2021  ? ESRD (end stage renal disease) ([place]) 07/28/2021  ? COPD (chronic obstructive pulmonary disease) with emphysema ([place]) 07/28/2021  ? Depression 07/28/2021  ? Chronic respiratory failure with hypoxia ([place]) 06/11/2021  ? HLD (hyperlipidemia) 06/10/2021  ? CKD (chronic kidney disease), stage IV ([place]) 06/10/2021  ? Elevated troponin   ? Chest pain 05/28/2021  ? Acute exacerbation of CHF (congestive heart failure) ([place]) 04/27/2021  ? CKD (chronic kidney disease) stage  4, GFR 15-29 ml/min (HCC) 04/04/2021  ? 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 Acute anemia 04/04/2021  ? Thrombocytopenia ([place]) 04/04/2021  ? ABLA (acute blood loss anemia) 04/04/2021  ? AKI (acute kidney injury) ([place]) 04/04/2021  ? Normocytic anemia   ? 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 CKD (chronic kidney disease) stage 3, GFR 30-59 ml/min (HCC) 05/18/2018  ? Elevated hemoglobin A1c 05/18/2018  ? History of skin cancer 05/18/2018  ? Ischemic cardiomyopathy 12/16/2017  ? S/P ICD (internal cardiac defibrillator) procedure 12/16/2017  ? Acute on chronic systolic CHF (congestive heart failure) ([place]) 09/07/2016  ? Congestive heart failure ([place]) 01/17/2015  ? Current tobacco use 01/17/2015  ? Hypercholesterolemia 01/17/2015  ? Peripheral vascular disease ([place]) 01/17/2015  ? 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 Arthritis, degenerative 07/22/2013  ? H/O angina pectoris 07/22/2013  ? Adiposity 07/22/2013  ? 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 NSTEMI (non-ST elevated myocardial infarction) ([place]) 12/17/2011  ? Hypokalemia 12/15/2011  ? SOB (shortness of breath) 12/14/2011  ? CAD (coronary artery disease) 12/14/2011  ? HTN (hypertension) 12/14/2011  ? Hyperlipidemia 12/14/2011  ? GERD (gastroesophageal reflux disease) 12/14/2011  ? BPH (benign prostatic hyperplasia) 12/14/2011  ? 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 ? ?Palliative Care Assessment & Plan  ? ? ? ?Recommendations/Plan: ? ?Family shifting to comfort care.  ? ? ? ? ?Code Status: ? ?  ?Code Status Orders  ?(From admission, onward)  ?  ? ? ?  ? ?  Start     Ordered  ? 08/05/21 1433  DNR (Do not attempt resuscitation)  Continuous       ?Question Answer Comment  ?In the event of cardiac or respiratory ARREST Do not call a ?code blue?   ?In the event  of cardiac or respiratory ARREST Do not  perform Intubation, CPR, defibrillation or ACLS   ?In the event of cardiac or respiratory ARREST Use medication by any route, position, wound care, and other measures to relive pain and suffering. May use oxygen, suction and manual treatment of airway obstruction as needed for comfort.   ?  ? 08/05/21 1432  ? ?  ?  ? ?  ? ?Code Status History   ? ? Date Active Date Inactive Code Status Order ID Comments User Context  ? 08/05/2021 1427 08/05/2021 1432 DNR [id number]  Flora Lipps, MD Inpatient  ? 08/01/2021 1913 08/05/2021 1427 Partial Code [id number]  Teressa Lower, NP Inpatient  ? 07/28/2021 1758 08/01/2021 1913 Full Code [id number]  Collier Bullock, MD Inpatient  ? 06/10/2021 0922 06/24/2021 1943 Full Code [id number]  Ivor Costa, MD ED  ? 05/28/2021 2157 06/04/2021 1711 Full Code [id number]  Para Skeans, MD ED  ? 04/27/2021 1931 05/02/2021 2339 Full Code [id number]  Mansy, Arvella Merles, MD ED  ? 04/04/2021 0917 04/09/2021 2133 Full Code [id number]  Collier Bullock, MD ED  ? 09/07/2016 2245 09/10/2016 1854 Full Code [id number]  Hugelmeyer, Ubaldo Glassing, DO Inpatient  ? 08/07/2016 2034 08/11/2016 1722 Full Code [id number]  Nicholes Mango, MD Inpatient  ? 12/14/2011 1348 12/18/2011 1430 Full Code [id number]  Erline Hau, MD Inpatient  ? 04/07/2011 0224 04/07/2011 1356 Full Code [id number]  Newton Pigg, RN Inpatient  ? ?  ? ? ?Care plan was discussed with CCM ? ?Thank you for allowing the Palliative Medicine Team to assist in the care of this patient. ? ? ?Asencion Gowda, NP ? ?Please contact Palliative Medicine Team phone at [phone] for questions and concerns.  ? ? ? ? ? ?

## 2021-08-05 NOTE — Progress Notes (Signed)
NP Lenord Carbo, Charge RN Alver Fisher, RT and this writer at bedside for pt re-evaluation related to increasing confusion and delirum despite previously noted interventions.  ? ?ABG preformed at the bedside. Results unable to link into Epic. Results placed into physical chart and below:  ?Ph 7.43 ?pCO2 49 ?pO2 47 ?

## 2021-08-05 NOTE — Progress Notes (Signed)
Pt seen attempting to get out of bed by this RN. Pt found with increased confusion with legs dangling off of bed rail.  ? ?Pt reoriented to situation and location and repositioned with staff back into bed. Pt reporting to this RN that he is "being held captive" and that he " has to get to his funeral".  ? ?Pt voicing concerns of impending doom stating "Im dying- you all are gonna let me die here, what am I supposed to be doing in this bed. Where am I? "  ? ?Pt de-escalated with therapeutic communication. Bed alarm reset. No physical needs expressed at this time.  ?

## 2021-08-05 NOTE — Plan of Care (Signed)
? ?  Interdisciplinary Goals of Care Family Meeting ? ? ?Date carried out: 08/05/2021 ? ?Location of the meeting: Bedside ? ?Member's involved: Physician, Bedside Registered Nurse, Family Member or next of kin, and Palliative care team member ? ? ?Code status: Full DNR ? ?Disposition: In-patient comfort care ? ? ? ?GOALS OF CARE DISCUSSION ? ?The Clinical status was relayed to family in detail- ? ?Updated and notified of patients medical condition- ?Patient remains unresponsive and will not open eyes to command.   ?Patient is having a weak cough and struggling to remove secretions.   ?Patient with increased WOB and using accessory muscles to breathe ?Explained to family course of therapy and the modalities  ? ?Patient with Progressive multiorgan failure with a very high probablity of a very minimal chance of meaningful recovery despite all aggressive and optimal medical therapy.  ?PATIENT IS IN THE DYING PROCESS ?HAS INCREASED WOB ?VERY LETHARGIC ? ? ? ?Family understands the situation. ? ?The family have  consented and agreed to DNR/DNI and would like to proceed with Comfort care measures. ? ?They also have decided not to use biPAP and not to initiate hemodialysis ? ?Family are satisfied with Plan of action and management. All questions answered ? ?Additional CC time 35 mins ? ? ?Corrin Parker, M.D.  ?[facility]  ?Medical Director [place] ?Medical Director [facility] Cardio-Pulmonary Department  ? ? ?

## 2021-08-05 NOTE — Progress Notes (Addendum)
Pt still attempting to get out of bed and incredibly restless. Pt pulling at IV lines despite safety mitt application.  ? ?12.5 mcg fentanyl administered per NP order with little relief of symptoms noted. RN at bedside as sitter at this time.  ? ? ?

## 2021-08-05 NOTE — Progress Notes (Signed)
SLP Cancellation Note ? ?Patient Details ?Name: Peter [name] ?MRN: [id number] ?DOB: Jun 13, 1940 ? ? ?Cancelled treatment:       Reason Eval/Treat Not Completed: Patient not medically ready. Per chart review, pt with worsening delirium, confusion overnight, remains on BiPAP today. D/w RN, pt just returned from head CT. Will hold evaluation until medically appropriate. ? ?Deneise Lever, MS, CCC-SLP ?Speech-Language Pathologist ?Office: [phone] ?ASCOM: [phone] ? ? ? ?Aliene Altes ?08/05/2021, 10:48 AM ? ? ?

## 2021-08-05 NOTE — Progress Notes (Signed)
Handoff received from [place] at bedside in [employer]- family *(daughter [name] and granddaughters of patient at bedside)* ? 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 ?Pt able to appropriately state the year, month, current president but disoriented to situation & location related questions.  ? ?Family given emotional support. No additional concerns posed to this RN at this time. Pt calm with family presence- medical monitoring equipment in place.  ?

## 2021-08-06 DIAGNOSIS — J9621 Acute and chronic respiratory failure with hypoxia: Secondary | ICD-10-CM | POA: Diagnosis not present

## 2021-08-06 DIAGNOSIS — Z7189 Other specified counseling: Secondary | ICD-10-CM | POA: Diagnosis not present

## 2021-08-06 DIAGNOSIS — I5023 Acute on chronic systolic (congestive) heart failure: Secondary | ICD-10-CM

## 2021-08-06 DIAGNOSIS — N186 End stage renal disease: Secondary | ICD-10-CM | POA: Diagnosis not present

## 2021-08-06 LAB — CYTOLOGY - NON PAP

## 2021-08-06 NOTE — Progress Notes (Signed)
?   08/06/21 1300  ?Clinical Encounter Type  ?Visited With Patient and family together  ?Visit Type Follow-up;Social support  ? ?Chaplain followed up with patient and friend after being placed on comfort care ?

## 2021-08-06 NOTE — Progress Notes (Signed)
Patient is currently on comfort care with the plan to transfer to Hospice after the approval process is completed. He is resting comfortably in bed with family (daughter, wife) present at bedside. He alert, in no apparent distress, and is able to verbalize needs. Will continue to monitor patient. ? ?Cameron Ali, RN ?

## 2021-08-06 NOTE — TOC Progression Note (Signed)
Transition of Care (TOC) - Progression Note  ? ? ?Patient Details  ?Name: Peter [name] ?MRN: [id number] ?Date of Birth: [birth year] ? ?Transition of Care (TOC) CM/SW Contact  ?Shelbie Hutching, RN ?Phone Number: ?08/06/2021, 11:18 AM ? ?Clinical Narrative:    ?Palliative care has been meeting with patient and family and they have decided on comfort care and no more dialysis.  RNCM has reached out to [facility] and gave [facility] Referral.   ? ? ?Expected Discharge Plan: [place] ?Barriers to Discharge: Continued Medical Work up ? ?Expected Discharge Plan and Services ?Expected Discharge Plan: [place] ?  ?Discharge Planning Services: CM Consult ?Post Acute Care Choice: Residential Hospice Bed ?Living arrangements for the past 2 months: [place] ?                ?DME Arranged: N/A ?DME Agency: NA ?  ?  ?  ?HH Arranged: NA ?[place] Agency: NA ?  ?  ?  ? ? ?Social Determinants of Health (SDOH) Interventions ?  ? ?Readmission Risk Interventions ? ?  06/11/2021  ? 12:25 PM 04/28/2021  ? 12:10 PM  ?Readmission Risk Prevention Plan  ?Transportation Screening Complete Complete  ?PCP or Specialist Appt within 3-5 Days  Complete  ?[place] or Home Care Consult  Complete  ?Social Work Consult for [place] Planning/Counseling  Complete  ?Palliative Care Screening  Not Applicable  ?Medication Review Press photographer) Complete Complete  ?PCP or Specialist appointment within 3-5 days of discharge Complete   ?[place] or Home Care Consult Complete   ?SW Recovery Care/Counseling Consult Complete   ?Palliative Care Screening Not Applicable   ?[place] Not Applicable   ? ? ?

## 2021-08-06 NOTE — Progress Notes (Addendum)
? ? ? ?Progress Note  ? ? ?Peter [name]  HYW:[id number] DOB: 14-Nov-1940  DOA: 07/28/2021 ?PCP: Tracie Harrier, MD  ? ? ? ? ?Brief Narrative:  ? ? ?Medical records reviewed and are as summarized below: ? ? ? ?[name] is an 81 y.o. male with medical history significant for ischemic cardiomyopathy with last known LVEF of 40 to 48%, chronic systolic heart failure, end-stage renal disease on hemodialysis (M/W/F), COPD with chronic respiratory failure on 4 L of oxygen, coronary artery disease, hypertension admitted for worsening shortness of breath, dark stools and anemia.  He received 1 unit pRBC for Hbg 6.7 on admission. ? ?Cardiology consulted for new onset A-fib and elevated troponin, acute on chronic HFrEF. ? ?4/18: 1 unit pRBC transfusion for Hbg 6.9.  GI consult, respiratory status makes sedation too high risk.   ? ?4/19: Hbg 7.6.  O2 requirement back to baseline.  Anxiety improved with low dose Xanax. ? ?4/20: Hbg improved to 8.5.  Respiratory status at baseline.  GI updated.  Has been off Plavix since discharge last month due to worsening anemia. ? ?4/21: EGD with gastric AVM's treated.  O2 desats suring procedure, therefore no colonopscopy at this time. ? ? ?  ?Significant Hospital Events: ?Including procedures, antibiotic start and stop dates in addition to other pertinent events   ?4/17: Presented to ED with shortness of breath, placed on BiPAP.  Admitted by the hospitalist, PCCM consulted.  To receive 1 unit of blood with hemodialysis today due to anemia pt does take plavix ?4/18: PCCM team signed off  ?4/18: GI consulted  ?4/21: Upper endoscopy revealed normal esophagus. Normal examined duodenum. 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 Treated with argon beam coagulation. No specimens collected. ?4/21: Rapid response called during hemodialysis pt developed severe hypoxic respiratory failure minimally responsive requiring transfer to ICU for emergent mechanical intubation  ?4/22:  Will perform SBT with plans for possible extubation following hemodialysis with fluid removal  ?4/23: Tolerating SBT, dialyzed yesterday evening, plan for extubation today ~ EXTUBATED ?4/24: Intermittent BiPAP with Precedex PRN for anxiety, plan for HD, consult IR for evaluation for Thoracentesis of bilateral pleural effusions.  High risk for reintubation ?4/25: Overnight pt with severe confusion and delirium requiring prn valium and precedex gtt.  ABG revealed: pH 7.43/pCO2 49/pO2 47.  Pt sedated on precedex gtt '@0'$ .8 mcg/kg/hr on Bipap '@50'$ %  ? ? ? ? ?He decided to discontinue dialysis and focus on comfort care. ? ? ?Assessment/Plan:  ? ?Principal Problem: ?  Acute on chronic respiratory failure with hypoxia ([place]) ?Active Problems: ?  Symptomatic anemia ?  Atrial fibrillation ([place]) ?  Ischemic cardiomyopathy ?  ESRD (end stage renal disease) ([place]) ?  Acute on chronic systolic CHF (congestive heart failure) ([place]) ?  Elevated troponin ?  Chronic respiratory failure with hypoxia (HCC) ?  COPD (chronic obstructive pulmonary disease) with emphysema ([place]) ?  Depression ?  Gastric AVM ?S/p EGD on 08/01/2021 which showed 3 nonbleeding angiectasia in the stomach that were treated with argon plasma coagulation ?Acute metabolic encephalopathy ? ? ?Body mass index is 27.95 kg/m?. ? ? ?PLAN ? ?He has been transitioned to comfort measures. ?Awaiting placement to hospice house.  Follow-up with hospice team to assist with disposition. ?Plan of care was discussed with the patient and his daughter at the bedside. ?Transfer from ICU to [facility] ? ? ? ?Diet Order   ? ?       ?  Diet regular Room service appropriate? Yes; Fluid  consistency: Thin  Diet effective now       ?  ? ?  ?  ? ?  ? ? ? ? ? ? ?Consultants: ?Palliative care, cardiologist, intensivist ? ?Procedures: ?EGD 08/01/2021 ? ? ? ?Medications:  ? ? Chlorhexidine Gluconate Cloth  6 each Topical Q0600  ? epoetin (EPOGEN/PROCRIT) injection  8,000 Units Intravenous Q M,W,F-HD  ?  sodium chloride flush  10-40 mL Intracatheter Q12H  ? sodium chloride flush  3 mL Intravenous Q12H  ? ?Continuous Infusions: ? sodium chloride    ? ? ? ?Anti-infectives (From admission, onward)  ? ? Start     Dose/Rate Route Frequency Ordered Stop  ? 08/01/21 2100  ceFEPIme (MAXIPIME) 1 g in sodium chloride 0.9 % 100 mL IVPB  Status:  Discontinued       ? 1 g ?200 mL/hr over 30 Minutes Intravenous Every 24 hours 08/01/21 1920 08/04/21 1034  ? ?  ? ? ? ? ? ? ? ? ? ?Family Communication/Anticipated D/C date and plan/Code Status  ? ?DVT prophylaxis:  ? ? ?  Code Status: DNR ? ?Family Communication: Plan discussed with his daughter, Peter [name], at the bedside ?Disposition Plan: Plan to discharge to hospice house ? ? ?Status is: Inpatient ?Remains inpatient appropriate because: Awaiting placement to hospice house ? ? ? ? ? ? ?Subjective:  ? ?Interval events noted.  He has no complaints.  He said he feels comfortable.  Peter [name], daughter, and patient's pastor were at the bedside. ? ?Objective:  ? ? ?Vitals:  ? 08/06/21 1100 08/06/21 1200 08/06/21 1300 08/06/21 1400  ?BP:      ?Pulse: 67 71 62 (!) 39  ?Resp: '19 17 18 19  '$ ?Temp:      ?TempSrc:      ?SpO2: 97% 94% 96% 95%  ?Weight:      ?Height:      ? ?No data found. ? ? ?Intake/Output Summary (Last 24 hours) at 08/06/2021 1715 ?Last data filed at 08/06/2021 1200 ?Gross per 24 hour  ?Intake 2.08 ml  ?Output 800 ml  ?Net -797.92 ml  ? ?Filed Weights  ? 08/02/21 1713 08/04/21 1458 08/04/21 1851  ?Weight: 96.4 kg 92.3 kg 90.9 kg  ? ? ?Exam: ? ?GEN: NAD ?SKIN: Warm and dry ?EYES: No pallor or icterus ?ENT: MMM ?CV: RRR ?PULM: CTA B ?ABD: soft, ND, NT, +BS ?CNS: AAO x 3, non focal ?EXT: No edema or tenderness ? ? ? ?  ? ? ?Data Reviewed:  ? ?I have personally reviewed following labs and imaging studies: ? ?Labs: ?Labs show the following:  ? ?Basic Metabolic Panel: ?Recent Labs  ?Lab 08/01/21 ?0238 08/02/21 ?8338 08/03/21 ?2505 08/04/21 ?0404 08/05/21 ?0841  ?NA 136 136 135 136 134*  ?K  4.1 4.3 3.8 4.4 3.6  ?CL 98 98 98 99 98  ?CO2 31 33* '29 27 25  '$ ?GLUCOSE 102* 81 72 58* 108*  ?BUN 43* 28* 24* 32* 21  ?CREATININE 4.44* 3.45* 3.43* 4.54* 3.39*  ?CALCIUM 8.3* 8.1* 8.2* 8.2* 8.2*  ?MG 2.0 1.9 1.6* 2.3 2.0  ?PHOS 3.3 3.5 2.8 6.2* 4.4  ? ?GFR ?Estimated Creatinine Clearance: 19.7 mL/min (A) (by C-G formula based on SCr of 3.39 mg/dL (H)). ?Liver Function Tests: ?Recent Labs  ?Lab 08/03/21 ?3976 08/04/21 ?1527  ?PROT  --  4.9*  ?ALBUMIN 2.8*  --   ? ?No results for input(s): LIPASE, AMYLASE in the last 168 hours. ?No results for input(s): AMMONIA in the last 168 hours. ?Coagulation profile ?  No results for input(s): INR, PROTIME in the last 168 hours. ? ?CBC: ?Recent Labs  ?Lab 08/01/21 ?0238 08/02/21 ?9937 08/03/21 ?1696 08/04/21 ?0404 08/05/21 ?0841  ?WBC 6.8 7.2 8.5 7.7 6.9  ?HGB 7.9* 8.3* 8.7* 8.3* 9.3*  ?HCT 24.9* 26.8* 27.6* 27.9* 29.3*  ?MCV 92.6 96.1 93.2 99.3 92.7  ?PLT 127* 141* 160 139* 124*  ? ?Cardiac Enzymes: ?No results for input(s): CKTOTAL, CKMB, CKMBINDEX, TROPONINI in the last 168 hours. ?BNP (last 3 results) ?No results for input(s): PROBNP in the last 8760 hours. ?CBG: ?Recent Labs  ?Lab 08/04/21 ?1637 08/04/21 ?2031 08/04/21 ?2316 08/05/21 ?0729 08/05/21 ?1119  ?GLUCAP 90 80 86 105* 104*  ? ?D-Dimer: ?No results for input(s): DDIMER in the last 72 hours. ?Hgb A1c: ?No results for input(s): HGBA1C in the last 72 hours. ?Lipid Profile: ?No results for input(s): CHOL, HDL, LDLCALC, TRIG, CHOLHDL, LDLDIRECT in the last 72 hours. ?Thyroid function studies: ?No results for input(s): TSH, T4TOTAL, T3FREE, THYROIDAB in the last 72 hours. ? ?Invalid input(s): FREET3 ?Anemia work up: ?No results for input(s): VITAMINB12, FOLATE, FERRITIN, TIBC, IRON, RETICCTPCT in the last 72 hours. ?Sepsis Labs: ?Recent Labs  ?Lab 08/02/21 ?7893 08/03/21 ?8101 08/04/21 ?0404 08/05/21 ?0841  ?WBC 7.2 8.5 7.7 6.9  ? ? ?Microbiology ?Recent Results (from the past 240 hour(s))  ?Resp Panel by RT-PCR (Flu A&B,  Covid) Nasopharyngeal Swab     Status: None  ? Collection Time: 07/28/21  8:24 AM  ? Specimen: Nasopharyngeal Swab; Nasopharyngeal(NP) swabs in vial transport medium  ?Result Value Ref Range Status  ? SARS Co

## 2021-08-06 NOTE — Progress Notes (Signed)
Message left with [employer] rep requesting assistance for disabling device.  ?

## 2021-08-06 NOTE — Progress Notes (Addendum)
? ?                                                                                                                                                     ?                                                   ?Daily Progress Note  ? ?Patient Name: Peter [name]       Date: 08/06/2021 ?DOB: 1940/12/24  Age: 81 y.o. MRN#: [id number] ?Attending Physician: Jennye Boroughs, MD ?Primary Care Physician: Tracie Harrier, MD ?Admit Date: 07/28/2021 ? ?Reason for Consultation/Follow-up: Establishing goals of care ? ?Subjective: ?In to ICU to see patient. CCM spoke with me regarding updates, and discussed possible wishes to continue dialysis per conversation this morning. Patient is alert and oriented with daughter at bedside. He laughs and jokes. He voices thoughts of continuing dialysis to see how things go. He does not recollect a lot of the hospitalization, but does discuss his status prior to the admission. He discusses having shortness of breath. Daughter discusses that he has been doing dialysis for around a month and initially he was doing well with it, but the last sessions were not producing the outcome and benefit they wanted. Detailed discussion on care moving forward. Various scenarios discussed. Ultimately, patient states he no longer wants to do dialysis and is indeed ready to shift to full comfort care with natural death. He is amenable to hospice facility placement for end of life care and looks forward to spending th time he has left with family. She is at peace with this plan as well.     ? ?Length of Stay: 9 ? ?Current Medications: ?Scheduled Meds:  ? Chlorhexidine Gluconate Cloth  6 each Topical Q0600  ? epoetin (EPOGEN/PROCRIT) injection  8,000 Units Intravenous Q M,W,F-HD  ? sodium chloride flush  10-40 mL Intracatheter Q12H  ? sodium chloride flush  3 mL Intravenous Q12H  ? ? ?Continuous Infusions: ? sodium chloride    ? ? ?PRN Meds: ?sodium chloride, acetaminophen **OR** acetaminophen, albuterol,  diazepam, diphenhydrAMINE, glycopyrrolate **OR** glycopyrrolate **OR** glycopyrrolate, midazolam, morphine injection, [DISCONTINUED] ondansetron **OR** ondansetron (ZOFRAN) IV, polyvinyl alcohol, sodium chloride flush, sodium chloride flush ? ?Physical Exam ?Pulmonary:  ?   Effort: Pulmonary effort is normal.  ?Neurological:  ?   Mental Status: He is alert.  ?         ? ?Vital Signs: BP (!) 153/61 (BP Location: Right Arm)   Pulse 76   Temp 98 ?F (36.7 ?C) (Oral)   Resp (!) 24   Ht '5\' 11"'$  (1.803 m)   Wt 90.9 kg   SpO2 96%  BMI 27.95 kg/m?  ?SpO2: SpO2: 96 % ?O2 Device: O2 Device: Nasal Cannula ?O2 Flow Rate: O2 Flow Rate (L/min): 4 L/min ? ?Intake/output summary:  ?Intake/Output Summary (Last 24 hours) at 08/06/2021 1000 ?Last data filed at 08/05/2021 2116 ?Gross per 24 hour  ?Intake 19.86 ml  ?Output --  ?Net 19.86 ml  ? ?LBM: Last BM Date : 07/31/21 ?Baseline Weight: Weight: 90 kg ?Most recent weight: Weight: 90.9 kg ? ? ?Patient Active Problem List  ? Diagnosis Date Noted  ? 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 Symptomatic anemia 07/28/2021  ? Acute on chronic respiratory failure with hypoxia ([place]) 07/28/2021  ? Atrial fibrillation ([place]) 07/28/2021  ? ESRD (end stage renal disease) ([place]) 07/28/2021  ? COPD (chronic obstructive pulmonary disease) with emphysema ([place]) 07/28/2021  ? Depression 07/28/2021  ? Chronic respiratory failure with hypoxia ([place]) 06/11/2021  ? HLD (hyperlipidemia) 06/10/2021  ? CKD (chronic kidney disease), stage IV ([place]) 06/10/2021  ? Elevated troponin   ? Chest pain 05/28/2021  ? Acute exacerbation of CHF (congestive heart failure) ([place]) 04/27/2021  ? CKD (chronic kidney disease) stage 4, GFR 15-29 ml/min (HCC) 04/04/2021  ? 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 Acute anemia 04/04/2021  ? Thrombocytopenia ([place]) 04/04/2021  ? ABLA (acute blood loss anemia) 04/04/2021  ? AKI (acute kidney injury) ([place]) 04/04/2021  ? Normocytic anemia    ? 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 CKD (chronic kidney disease) stage 3, GFR 30-59 ml/min (HCC) 05/18/2018  ? Elevated hemoglobin A1c 05/18/2018  ? History of skin cancer 05/18/2018  ? Ischemic cardiomyopathy 12/16/2017  ? S/P ICD (internal cardiac defibrillator) procedure 12/16/2017  ? Acute on chronic systolic CHF (congestive heart failure) ([place]) 09/07/2016  ? Congestive heart failure ([place]) 01/17/2015  ? Current tobacco use 01/17/2015  ? Hypercholesterolemia 01/17/2015  ? Peripheral vascular disease ([place]) 01/17/2015  ? 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 Arthritis, degenerative 07/22/2013  ? H/O angina pectoris 07/22/2013  ? Adiposity 07/22/2013  ? 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 NSTEMI (non-ST elevated myocardial infarction) ([place]) 12/17/2011  ? Hypokalemia 12/15/2011  ? SOB (shortness of breath) 12/14/2011  ? CAD (coronary artery disease) 12/14/2011  ? HTN (hypertension) 12/14/2011  ? Hyperlipidemia 12/14/2011  ? GERD (gastroesophageal reflux disease) 12/14/2011  ? BPH (benign prostatic hyperplasia) 12/14/2011  ? 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 ? ?Palliative Care Assessment & Plan  ? ? ?Recommendations/Plan: ?Hospice facility recommended. Stopping dialysis. Prognosis is < 2 weeks.  ? ? ? ? ?Code Status: ? ?  ?Code Status Orders  ?(From admission, onward)  ?  ? ? ?  ? ?  Start     Ordered  ? 08/05/21 1433  DNR (Do not attempt resuscitation)  Continuous       ?Question Answer Comment  ?In the event of cardiac or respiratory ARREST Do not call a ?code blue?   ?In the event of cardiac or respiratory ARREST Do not perform Intubation, CPR, defibrillation or ACLS   ?In the event of cardiac or respiratory ARREST Use medication by any route, position, wound care, and other measures to relive pain and suffering. May use oxygen, suction and manual treatment  of airway obstruction as needed for comfort.   ?  ? 08/05/21 1432  ? ?  ?  ? ?  ? ?Code Status History   ? ?  Date Active Date Inactive Code Status Order ID Comments User Context  ? 08/05/2021 1427 08/05/2021 1432 DNR [id number]  Flora Lipps, MD Inpatient  ? 08/01/2021 1913 08/05/2021 1427 Partial Code [id number]  Teressa Lower, NP Inpatient  ? 07/28/2021 1758 08/01/2021 1913 Full Code [id number]  Collier Bullock, MD Inpatient  ? 06/10/2021 0922 06/24/2021 1943 Full Code [id number]  Ivor Costa, MD ED  ? 05/28/2021 2157 06/04/2021 1711 Full Code [id number]  Para Skeans, MD ED  ? 04/27/2021 1931 05/02/2021 2339 Full Code [id number]  Mansy, Arvella Merles, MD ED  ? 04/04/2021 0917 04/09/2021 2133 Full Code [id number]  Collier Bullock, MD ED  ? 09/07/2016 2245 09/10/2016 1854 Full Code [id number]  Hugelmeyer, Ubaldo Glassing, DO Inpatient  ? 08/07/2016 2034 08/11/2016 1722 Full Code [id number]  Nicholes Mango, MD Inpatient  ? 12/14/2011 1348 12/18/2011 1430 Full Code [id number]  Erline Hau, MD Inpatient  ? 04/07/2011 0224 04/07/2011 1356 Full Code [id number]  Newton Pigg, RN Inpatient  ? ?  ? ?Care plan was discussed with CCM ? ?Thank you for allowing the Palliative Medicine Team to assist in the care of this patient. ? ? ?Asencion Gowda, NP ? ?Please contact Palliative Medicine Team phone at [phone] for questions and concerns.  ? ? ? ? ? ?

## 2021-08-06 NOTE — Progress Notes (Signed)
Manufacturing engineer [facility]) Hospital Liaison Note ? ?Received request from Transitions of Care Manager Pryor Montes for family interest in [facility]. Visited patient at bedside and spoke with daughter/Robin Putnam: [phone] to confirm interest and explain services. ? ?Approval for Hospice Home is determined by [facility] MD. Once [facility] MD has determined Hospice Home eligibility, [place] will update hospital staff and family. Eligibility is pending ? ?Please do not hesitate to call with any hospice related questions.  ?  ?Thank you for the opportunity to participate in this patient's care. ? ?Daphene Calamity, MSW ?[place]  ?[phone] ? ?

## 2021-08-06 NOTE — Progress Notes (Signed)
Per Dr. Mortimer Fries placed magnet on patients pacemaker/ICD. E-link called pacemaker/ICD company and left message regarding the need to turn device off. Continue to assess.  ?

## 2021-08-06 NOTE — Progress Notes (Signed)
[employer] arrived to ICU to stop therapies Pacemaker/ICD. Removed magnet.  ?

## 2021-08-06 NOTE — Progress Notes (Signed)
SLP Cancellation Note ? ?Patient Details ?Name: Peter [name] ?MRN: [id number] ?DOB: 1940-04-19 ? ? ?Cancelled treatment:       Reason Eval/Treat Not Completed: SLP screened, no needs identified, will sign off. Per chart review pt disposition is full comfort measures. Defer swallow evaluation; SLP to s/o. ? ?Deneise Lever, MS, CCC-SLP ?Speech-Language Pathologist ?Office: [phone] ?ASCOM: [phone] ? ? ? ?Aliene Altes ?08/06/2021, 8:27 AM ? ? ?

## 2021-08-06 NOTE — Progress Notes (Signed)
Patient urine appearance is now a thick cream color. He has no complaints of pain. Secured chat Dr. Mal Misty. Continue to assess. ?

## 2021-08-07 DIAGNOSIS — Z7189 Other specified counseling: Secondary | ICD-10-CM | POA: Diagnosis not present

## 2021-08-07 DIAGNOSIS — J9621 Acute and chronic respiratory failure with hypoxia: Secondary | ICD-10-CM | POA: Diagnosis not present

## 2021-08-07 DIAGNOSIS — I5023 Acute on chronic systolic (congestive) heart failure: Secondary | ICD-10-CM | POA: Diagnosis not present

## 2021-08-07 LAB — BODY FLUID CULTURE W GRAM STAIN
Culture: NO GROWTH
Gram Stain: NONE SEEN

## 2021-08-07 MED ORDER — DIAZEPAM 5 MG PO TABS
5.0000 mg | ORAL_TABLET | Freq: Four times a day (QID) | ORAL | Status: DC | PRN
  Administered 2021-08-07: 5 mg via ORAL
  Filled 2021-08-07 (×2): qty 1

## 2021-08-07 MED ORDER — ALPRAZOLAM 0.5 MG PO TABS
0.5000 mg | ORAL_TABLET | Freq: Two times a day (BID) | ORAL | Status: DC | PRN
  Administered 2021-08-07: 0.5 mg via ORAL
  Filled 2021-08-07: qty 1

## 2021-08-07 MED ORDER — GUAIFENESIN-CODEINE 100-10 MG/5ML PO SOLN
10.0000 mL | ORAL | Status: DC | PRN
  Administered 2021-08-07: 10 mL via ORAL
  Filled 2021-08-07: qty 10

## 2021-08-07 MED ORDER — CLOPIDOGREL BISULFATE 75 MG PO TABS
75.0000 mg | ORAL_TABLET | Freq: Every day | ORAL | Status: DC
  Administered 2021-08-07 – 2021-08-08 (×2): 75 mg via ORAL
  Filled 2021-08-07 (×2): qty 1

## 2021-08-07 NOTE — Progress Notes (Addendum)
? ?                                                                                                                                                     ?                                                   ?Daily Progress Note  ? ?Patient Name: Peter [name]       Date: 08/07/2021 ?DOB: 1940/05/04  Age: 81 y.o. MRN#: [id number] ?Attending Physician: Jennye Boroughs, MD ?Primary Care Physician: Tracie Harrier, MD ?Admit Date: 07/28/2021 ? ?Reason for Consultation/Follow-up: Establishing goals of care ? ?Subjective: ?Notes reviewed. Updated by staff patient is no longer wanting hospice care, that a hospice bed is available, and patient may want to transition to more aggressive care. Patient is resting in bed with granddaughter at bedside. Second granddaughter came to bedside during conversation. He states he would like the medications for his stents. Discussed that with stopping dialysis and shifting to a comfort focus, medications are provided for comfort. He discusses stopping dialysis and questions that he would die without dialysis. Answered questions. He discusses he is not hungry and points that he has only eaten a few bites of a biscuit. Discussed that with more aggressive care we would be looking at ways to get him nutrition. Reiterated the qualities of a comfort focus. He states what if I want to go with some of that? I asked what things and he said "those things you were talking about". He was unable to reflect any of our conversation back to me and cannot articulate what care he wants besides the medicine for his stent.  ? ?Granddaughter requested I speak with daughter [name]. We stepped out and [name] was on the phone. She is unhappy that I spoke with him without talking with her first. Discussed my intent was to check for symptom management needs as goals have been set. She discusses she understands he does not have capacity to make decisions, but is oriented enough to ask questions and discuss concerns.  She discusses his anxiety and that hospice does not need to be discussed with him. Discussed sending him to hospice with his current thoughts would not helpful. She states she was advised that he would remain a hospice candidate and could come to hospice facility when he is no longer oriented enough to be anxious about coming to hospice. She states this was discussed with hospice liaison.  She discusses that patient needs to go to the floor and have that transition period, not going straight to hospice unless he is at a place of being unaware. Dr. Mal Misty entered conversation. They recapped conversation  and discussed medication management. Daughter discusses she does not want the word "hospice" used around him again.  ? ?Discussed that I will be off service until Monday, and that we can follow up then, or if she wishes, since goals are set and hospice liaison is following, PMT can sign off if not needed. She and attending are okay with this. Attending team will call for any concerns or needs. Attending team is aware symptoms may escalate, and comfort medications may need to be adjusted.   ? ? ?Daughter later spoke with me to voice being grateful for our assistance and care.     ? ?Length of Stay: 10 ? ?Current Medications: ?Scheduled Meds:  ? Chlorhexidine Gluconate Cloth  6 each Topical Q0600  ? clopidogrel  75 mg Oral Daily  ? epoetin (EPOGEN/PROCRIT) injection  8,000 Units Intravenous Q M,W,F-HD  ? sodium chloride flush  10-40 mL Intracatheter Q12H  ? sodium chloride flush  3 mL Intravenous Q12H  ? ? ?Continuous Infusions: ? sodium chloride    ? ? ?PRN Meds: ?sodium chloride, acetaminophen **OR** acetaminophen, albuterol, ALPRAZolam, diazepam, diphenhydrAMINE, glycopyrrolate **OR** glycopyrrolate **OR** glycopyrrolate, guaiFENesin-codeine, midazolam, morphine injection, [DISCONTINUED] ondansetron **OR** ondansetron (ZOFRAN) IV, polyvinyl alcohol, sodium chloride flush, sodium chloride flush ? ?Physical  Exam ?Pulmonary:  ?   Effort: Pulmonary effort is normal.  ?Neurological:  ?   Mental Status: He is alert.  ?         ? ?Vital Signs: BP (!) 137/57 (BP Location: Left Arm)   Pulse 68   Temp 98.1 ?F (36.7 ?C) (Axillary)   Resp (!) 24   Ht '5\' 11"'$  (1.803 m)   Wt 90.9 kg   SpO2 95%   BMI 27.95 kg/m?  ?SpO2: SpO2: 95 % ?O2 Device: O2 Device: Nasal Cannula ?O2 Flow Rate: O2 Flow Rate (L/min): 3 L/min ? ?Intake/output summary:  ?Intake/Output Summary (Last 24 hours) at 08/07/2021 1105 ?Last data filed at 08/07/2021 0500 ?Gross per 24 hour  ?Intake --  ?Output 1070 ml  ?Net -1070 ml  ? ?LBM: Last BM Date : 07/31/21 ?Baseline Weight: Weight: 90 kg ?Most recent weight: Weight: 90.9 kg ? ? ?Patient Active Problem List  ? Diagnosis Date Noted  ? 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 Symptomatic anemia 07/28/2021  ? Acute on chronic respiratory failure with hypoxia ([place]) 07/28/2021  ? Atrial fibrillation ([place]) 07/28/2021  ? ESRD (end stage renal disease) ([place]) 07/28/2021  ? COPD (chronic obstructive pulmonary disease) with emphysema ([place]) 07/28/2021  ? Depression 07/28/2021  ? Chronic respiratory failure with hypoxia ([place]) 06/11/2021  ? HLD (hyperlipidemia) 06/10/2021  ? CKD (chronic kidney disease), stage IV ([place]) 06/10/2021  ? Elevated troponin   ? Chest pain 05/28/2021  ? Acute exacerbation of CHF (congestive heart failure) ([place]) 04/27/2021  ? CKD (chronic kidney disease) stage 4, GFR 15-29 ml/min (HCC) 04/04/2021  ? 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 Acute anemia 04/04/2021  ? Thrombocytopenia ([place]) 04/04/2021  ? ABLA (acute blood loss anemia) 04/04/2021  ? AKI (acute kidney injury) ([place]) 04/04/2021  ? Normocytic anemia   ? 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 CKD (chronic kidney disease) stage 3, GFR 30-59 ml/min (HCC) 05/18/2018  ? Elevated hemoglobin A1c 05/18/2018  ? History of skin cancer 05/18/2018  ?  Ischemic cardiomyopathy 12/16/2017  ? S/P ICD (internal cardiac defibrillator) procedure 12/16/2017  ? Acute on chronic systolic CHF (congestive heart failure) ([place])  09/07/2016  ? Congestive heart failure ([place]) 01/17/2015  ? Current tobacco use 01/17/2015  ? Hypercholesterolemia 01/17/2015  ? Peripheral vascular disease ([place]) 01/17/2015  ? 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 Arthritis, degenerative 07/22/2013  ? H/O angina pectoris 07/22/2013  ? Adiposity 07/22/2013  ? 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 NSTEMI (non-ST elevated myocardial infarction) ([place]) 12/17/2011  ? Hypokalemia 12/15/2011  ? SOB (shortness of breath) 12/14/2011  ? CAD (coronary artery disease) 12/14/2011  ? HTN (hypertension) 12/14/2011  ? Hyperlipidemia 12/14/2011  ? GERD (gastroesophageal reflux disease) 12/14/2011  ? BPH (benign prostatic hyperplasia) 12/14/2011  ? 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 Aneurysm of thoracic aorta ([place]) 12/14/2011  ? ? ?Palliative Care Assessment & Plan  ? ?Recommendations/Plan: ?Attending team to call with questions or concerns.  ? ? ? ?Code Status: ? ?  ?Code Status Orders  ?(From admission, onward)  ?  ? ? ?  ? ?  Start     Ordered  ? 08/05/21 1433  DNR (Do not attempt resuscitation)  Continuous       ?Question Answer Comment  ?In the event of cardiac or respiratory ARREST Do not call a ?code blue?   ?In the event of cardiac or respiratory ARREST Do not perform Intubation, CPR, defibrillation or ACLS   ?In the event of cardiac or respiratory ARREST Use medication by any route, position, wound care, and other measures to relive pain and suffering. May use oxygen, suction and manual treatment of airway obstruction as needed for comfort.   ?  ? 08/05/21 1432  ? ?  ?  ? ?  ? ?Code Status History   ? ? Date Active Date Inactive Code Status Order ID Comments User Context  ? 08/05/2021 1427 08/05/2021 1432 DNR [id number]  Flora Lipps, MD Inpatient  ? 08/01/2021  1913 08/05/2021 1427 Partial Code [id number]  Teressa Lower, NP Inpatient  ? 07/28/2021 1758 08/01/2021 1913 Full Code [id number]  Collier Bullock, MD Inpatient  ? 06/10/2021 0922 06/24/2021 1943 Full Code [id number]  Ivor Costa, MD

## 2021-08-07 NOTE — Progress Notes (Addendum)
? ? ? ?Progress Note  ? ? ?Peter [name]  VOH:[id number] DOB: 09-18-1940  DOA: 07/28/2021 ?PCP: Tracie Harrier, MD  ? ? ? ? ?Brief Narrative:  ? ? ?Medical records reviewed and are as summarized below: ? ? ? ?Peter [name] is an 81 y.o. male with medical history significant for ischemic cardiomyopathy with last known LVEF of 40 to 69%, chronic systolic heart failure, end-stage renal disease on hemodialysis (M/W/F), COPD with chronic respiratory failure on 4 L of oxygen, coronary artery disease, hypertension admitted for worsening shortness of breath, dark stools and anemia.  He received 1 unit pRBC for Hbg 6.7 on admission. ? ?Cardiology consulted for new onset A-fib and elevated troponin, acute on chronic HFrEF. ? ?4/18: 1 unit pRBC transfusion for Hbg 6.9.  GI consult, respiratory status makes sedation too high risk.   ? ?4/19: Hbg 7.6.  O2 requirement back to baseline.  Anxiety improved with low dose Xanax. ? ?4/20: Hbg improved to 8.5.  Respiratory status at baseline.  GI updated.  Has been off Plavix since discharge last month due to worsening anemia. ? ?4/21: EGD with gastric AVM's treated.  O2 desats suring procedure, therefore no colonopscopy at this time. ? ? ?  ?Significant Hospital Events: ?Including procedures, antibiotic start and stop dates in addition to other pertinent events   ?4/17: Presented to ED with shortness of breath, placed on BiPAP.  Admitted by the hospitalist, PCCM consulted.  To receive 1 unit of blood with hemodialysis today due to anemia pt does take plavix ?4/18: PCCM team signed off  ?4/18: GI consulted  ?4/21: Upper endoscopy revealed normal esophagus. Normal examined duodenum. 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 Treated with argon beam coagulation. No specimens collected. ?4/21: Rapid response called during hemodialysis pt developed severe hypoxic respiratory failure minimally responsive requiring transfer to ICU for emergent mechanical intubation  ?4/22:  Will perform SBT with plans for possible extubation following hemodialysis with fluid removal  ?4/23: Tolerating SBT, dialyzed yesterday evening, plan for extubation today ~ EXTUBATED ?4/24: Intermittent BiPAP with Precedex PRN for anxiety, plan for HD, consult IR for evaluation for Thoracentesis of bilateral pleural effusions.  High risk for reintubation ?4/25: Overnight pt with severe confusion and delirium requiring prn valium and precedex gtt.  ABG revealed: pH 7.43/pCO2 49/pO2 47.  Pt sedated on precedex gtt '@0'$ .8 mcg/kg/hr on Bipap '@50'$ %  ? ? ? ? ?He decided to discontinue dialysis and focus on comfort care. ? ? ?Assessment/Plan:  ? ?Principal Problem: ?  Acute on chronic respiratory failure with hypoxia ([place]) ?Active Problems: ?  Symptomatic anemia ?  Atrial fibrillation ([place]) ?  Ischemic cardiomyopathy ?  ESRD (end stage renal disease) ([place]) ?  Acute on chronic systolic CHF (congestive heart failure) ([place]) ?  Elevated troponin ?  Chronic respiratory failure with hypoxia (HCC) ?  COPD (chronic obstructive pulmonary disease) with emphysema ([place]) ?  Depression ?  Gastric AVM ?S/p EGD on 08/01/2021 which showed 3 nonbleeding angiectasia in the stomach that were treated with argon plasma coagulation ?Acute metabolic encephalopathy ? ? ?Body mass index is 27.95 kg/m?. ? ? ?PLAN ? ?Continue comfort measures. ?Awaiting placement to hospice house.  Patient was offered a bed today but patient was anxious about going directly to hospice.  Patient and his daughter preferred that he is transferred out of the ICU to MedSurg unit before transitioning to hospice.  Discharge order was canceled. ?Of note, patient insisted on t restarting Plavix and his daughter was in agreement. ?  Plan of care was discussed with Crystal, NP, from palliative care, [name] (daughter) on speaker phone and 2 grand daughters at the bedside ? ? ? ?Diet Order   ? ?       ?  Diet general       ?  ?  Diet regular Room service appropriate? Yes; Fluid  consistency: Thin  Diet effective now       ?  ? ?  ?  ? ?  ? ? ? ? ? ? ?Consultants: ?Palliative care, cardiologist, intensivist ? ?Procedures: ?EGD 08/01/2021 ? ? ? ?Medications:  ? ? Chlorhexidine Gluconate Cloth  6 each Topical Q0600  ? clopidogrel  75 mg Oral Daily  ? epoetin (EPOGEN/PROCRIT) injection  8,000 Units Intravenous Q M,W,F-HD  ? sodium chloride flush  10-40 mL Intracatheter Q12H  ? sodium chloride flush  3 mL Intravenous Q12H  ? ?Continuous Infusions: ? sodium chloride    ? ? ? ?Anti-infectives (From admission, onward)  ? ? Start     Dose/Rate Route Frequency Ordered Stop  ? 08/01/21 2100  ceFEPIme (MAXIPIME) 1 g in sodium chloride 0.9 % 100 mL IVPB  Status:  Discontinued       ? 1 g ?200 mL/hr over 30 Minutes Intravenous Every 24 hours 08/01/21 1920 08/04/21 1034  ? ?  ? ? ? ? ? ? ? ? ? ?Family Communication/Anticipated D/C date and plan/Code Status  ? ?DVT prophylaxis:  ? ? ?  Code Status: DNR ? ?Family Communication: Plan discussed with his daughter, [name], over the phone and 2 granddaughters at the bedside.   ?Disposition Plan: Plan to discharge to hospice house tomorrow ? ? ?Status is: Inpatient ?Remains inpatient appropriate because: Awaiting placement to hospice house ? ? ? ? ? ? ?Subjective:  ? ?Interval events noted.  He has no complaints.  He is comfortable.  2 granddaughters were at the bedside. ? ?Objective:  ? ? ?Vitals:  ? 08/07/21 0408 08/07/21 0500 08/07/21 0600 08/07/21 0700  ?BP:    (!) 137/57  ?Pulse: 70 63 61 68  ?Resp: 18 (!) 26 (!) 22 (!) 24  ?Temp:    98.1 ?F (36.7 ?C)  ?TempSrc:    Axillary  ?SpO2: 94% 95% 95% 95%  ?Weight:      ?Height:      ? ?No data found. ? ? ?Intake/Output Summary (Last 24 hours) at 08/07/2021 1702 ?Last data filed at 08/07/2021 0500 ?Gross per 24 hour  ?Intake --  ?Output 270 ml  ?Net -270 ml  ? ?Filed Weights  ? 08/02/21 1713 08/04/21 1458 08/04/21 1851  ?Weight: 96.4 kg 92.3 kg 90.9 kg  ? ? ?Exam: ? ?GEN: NAD ?SKIN: No rash ?EYES: EOMI ?ENT: MMM ?CV:  RRR ?PULM: CTA B ?ABD: soft, ND, NT, +BS ?CNS: AAO x 3, non focal ?EXT: No edema or tenderness ? ? ? ?  ? ? ?Data Reviewed:  ? ?I have personally reviewed following labs and imaging studies: ? ?Labs: ?Labs show the following:  ? ?Basic Metabolic Panel: ?Recent Labs  ?Lab 08/01/21 ?0238 08/02/21 ?4010 08/03/21 ?2725 08/04/21 ?0404 08/05/21 ?0841  ?NA 136 136 135 136 134*  ?K 4.1 4.3 3.8 4.4 3.6  ?CL 98 98 98 99 98  ?CO2 31 33* '29 27 25  '$ ?GLUCOSE 102* 81 72 58* 108*  ?BUN 43* 28* 24* 32* 21  ?CREATININE 4.44* 3.45* 3.43* 4.54* 3.39*  ?CALCIUM 8.3* 8.1* 8.2* 8.2* 8.2*  ?MG 2.0 1.9 1.6* 2.3 2.0  ?PHOS  3.3 3.5 2.8 6.2* 4.4  ? ?GFR ?Estimated Creatinine Clearance: 19.7 mL/min (A) (by C-G formula based on SCr of 3.39 mg/dL (H)). ?Liver Function Tests: ?Recent Labs  ?Lab 08/03/21 ?8413 08/04/21 ?1527  ?PROT  --  4.9*  ?ALBUMIN 2.8*  --   ? ?No results for input(s): LIPASE, AMYLASE in the last 168 hours. ?No results for input(s): AMMONIA in the last 168 hours. ?Coagulation profile ?No results for input(s): INR, PROTIME in the last 168 hours. ? ?CBC: ?Recent Labs  ?Lab 08/01/21 ?0238 08/02/21 ?2440 08/03/21 ?1027 08/04/21 ?0404 08/05/21 ?0841  ?WBC 6.8 7.2 8.5 7.7 6.9  ?HGB 7.9* 8.3* 8.7* 8.3* 9.3*  ?HCT 24.9* 26.8* 27.6* 27.9* 29.3*  ?MCV 92.6 96.1 93.2 99.3 92.7  ?PLT 127* 141* 160 139* 124*  ? ?Cardiac Enzymes: ?No results for input(s): CKTOTAL, CKMB, CKMBINDEX, TROPONINI in the last 168 hours. ?BNP (last 3 results) ?No results for input(s): PROBNP in the last 8760 hours. ?CBG: ?Recent Labs  ?Lab 08/04/21 ?1637 08/04/21 ?2031 08/04/21 ?2316 08/05/21 ?0729 08/05/21 ?1119  ?GLUCAP 90 80 86 105* 104*  ? ?D-Dimer: ?No results for input(s): DDIMER in the last 72 hours. ?Hgb A1c: ?No results for input(s): HGBA1C in the last 72 hours. ?Lipid Profile: ?No results for input(s): CHOL, HDL, LDLCALC, TRIG, CHOLHDL, LDLDIRECT in the last 72 hours. ?Thyroid function studies: ?No results for input(s): TSH, T4TOTAL, T3FREE, THYROIDAB in  the last 72 hours. ? ?Invalid input(s): FREET3 ?Anemia work up: ?No results for input(s): VITAMINB12, FOLATE, FERRITIN, TIBC, IRON, RETICCTPCT in the last 72 hours. ?Sepsis Labs: ?Recent Labs  ?Lab 08/02/21 ?2536

## 2021-08-07 NOTE — Progress Notes (Signed)
?   08/07/21 1100  ?Clinical Encounter Type  ?Visited With Patient and family together  ?Visit Type Follow-up;Social support  ? ?Chaplain provided follow up care as patient continues through difficult transitions ?

## 2021-08-07 NOTE — Progress Notes (Signed)
[facility]) Hospital Liaison Note ?  ?Bed offered and declined as family requested that patient be transferred to the stepdown unit before transferring to [facility] as they feel that he is too anxious to go today. Additionally, family plans to [place] today to provide patient with additional comfort/reassurance. ?  ?IDT notified. [facility] to continue to follow if family wishes to transition to [place]  ?  ?Please do not hesitate to call with any hospice related questions.  ?  ?Thank you for the opportunity to participate in this patient's care. ?  ?Daphene Calamity, MSW ?[place] ?[phone] ?

## 2021-08-07 NOTE — Progress Notes (Signed)
1300 Transferred to room 211 via bed. Report given earlier to RN. ?

## 2021-08-08 DIAGNOSIS — D649 Anemia, unspecified: Secondary | ICD-10-CM | POA: Diagnosis not present

## 2021-08-08 DIAGNOSIS — N186 End stage renal disease: Secondary | ICD-10-CM | POA: Diagnosis not present

## 2021-08-08 DIAGNOSIS — I5023 Acute on chronic systolic (congestive) heart failure: Secondary | ICD-10-CM | POA: Diagnosis not present

## 2021-08-08 DIAGNOSIS — J9621 Acute and chronic respiratory failure with hypoxia: Secondary | ICD-10-CM | POA: Diagnosis not present

## 2021-08-10 DIAGNOSIS — Z992 Dependence on renal dialysis: Secondary | ICD-10-CM | POA: Diagnosis not present

## 2021-08-10 DIAGNOSIS — N186 End stage renal disease: Secondary | ICD-10-CM | POA: Diagnosis not present

## 2021-08-11 NOTE — Discharge Summary (Signed)
?Physician Discharge Summary ?  ?Patient: Peter [name] MRN: [id number] DOB: 1940-11-28  ?Admit date:     07/28/2021  ?Discharge date: 08-31-2021  ?Discharge Physician: Peter [name]  ? ?PCP: Peter Harrier, MD  ? ?Recommendations at discharge:  ? ? ?Follow-up with hospice team at the hospice house ? ?Discharge Diagnoses: ?Principal Problem: ?  Acute on chronic respiratory failure with hypoxia (HCC) ?Active Problems: ?  Symptomatic anemia ?  Atrial fibrillation ([place]) ?  Ischemic cardiomyopathy ?  ESRD (end stage renal disease) ([place]) ?  Acute on chronic systolic CHF (congestive heart failure) ([place]) ?  Elevated troponin ?  Chronic respiratory failure with hypoxia (HCC) ?  COPD (chronic obstructive pulmonary disease) with emphysema ([place]) ?  Depression ?  Gastric AVM ?S/p EGD on 08/01/2021 which showed 3 nonbleeding angiectasia in the stomach that were treated with argon plasma coagulation ?Acute metabolic encephalopathy ? ?Hospital Course: ? ? ?[name] is an 81 y.o. male with medical history significant for ischemic cardiomyopathy with last known LVEF of 40 to 26%, chronic systolic heart failure, end-stage renal disease on hemodialysis (M/W/F), COPD with chronic respiratory failure on 4 L of oxygen, coronary artery disease, hypertension admitted for worsening shortness of breath, dark stools and anemia.   ? ?He was admitted to the hospital for new onset A-fib, acute on chronic systolic CHF, elevated troponin, acute on chronic hypoxic respiratory failure and symptomatic severe anemia.  He was treated with IV Lasix, BiPAP and nephrologist was consulted for hemodialysis. ? ?He was seen by the cardiologist CHF exacerbation and elevated troponins.  Elevated troponins were attributed to demand ischemia.  He was transfused with 2 units of packed red blood cells with severe anemia.  He underwent EGD which showed 3 nonbleeding gastric angiectasias that were treated with argon plasma coagulation.   ? ? ?Of note,  patient developed severe hypoxic respiratory failure and became minimally responsive while he was having hemodialysis.  He was intubated and placed on mechanical ventilation and was transferred to the ICU for further management.  He underwent right-sided thoracentesis for right pleural effusion and 900 mL of fluid was removed. ? ?Goals of care were discussed with the patient and his daughter, Peter [name].  Patient decided to discontinue treatment including dialysis and opted for comfort measures with hospice.  He was transferred to [facility] hospitalist service on 08/06/2021.  He was evaluated by the hospice team and he was deemed to be eligible for comfort measures at the hospice house.  He is stable for discharge to hospice house today.  Patient and Peter [name], daughter, are in agreement with the plan. His brother, [name] and his family friend were at the bedside during today's encounter. ? ? ? ? ? ? ? ? ?  ? ? ?Consultants: Intensivist, gastroenterologist, cardiologist, nephrologist, palliative care and hospice ?Procedures performed: EGD, intubation and mechanical ventilation ?Disposition: Hospice care ?Diet recommendation:  ?Discharge Diet Orders (From admission, onward)  ? ?  Start     Ordered  ? 08/07/21 0000  Diet general       ? 08/07/21 0904  ? ?  ?  ? ?  ? ?Regular diet ?DISCHARGE MEDICATION: ?Allergies as of 08-31-2021   ? ?   Reactions  ? Iodinated Contrast Media Shortness Of Breath  ? Iodinated Glycerol  [glycerol, Iodinated] Shortness Of Breath  ? ?  ? ?  ?Medication List  ?  ? ?STOP taking these medications   ? ?amLODipine 10 MG tablet ?Commonly known as: NORVASC ?  ?  aspirin EC 81 MG tablet ?  ?atorvastatin 40 MG tablet ?Commonly known as: LIPITOR ?  ?calcitRIOL 0.25 MCG capsule ?Commonly known as: ROCALTROL ?  ?carvedilol 6.25 MG tablet ?Commonly known as: COREG ?  ?cetirizine 10 MG tablet ?Commonly known as: ZYRTEC ?  ?citalopram 20 MG tablet ?Commonly known as: CELEXA ?  ?clobetasol cream 0.05 % ?Commonly  known as: TEMOVATE ?  ?clopidogrel 75 MG tablet ?Commonly known as: PLAVIX ?  ?cyanocobalamin 1000 MCG tablet ?  ?finasteride 5 MG tablet ?Commonly known as: PROSCAR ?  ?GLUCOSAMINE-CHONDROITIN-VIT C PO ?  ?hydrALAZINE 100 MG tablet ?Commonly known as: APRESOLINE ?  ?isosorbide mononitrate 30 MG 24 hr tablet ?Commonly known as: IMDUR ?  ?omeprazole 20 MG capsule ?Commonly known as: PRILOSEC ?  ?tamsulosin 0.4 MG Caps capsule ?Commonly known as: FLOMAX ?  ?umeclidinium bromide 62.5 MCG/ACT Aepb ?Commonly known as: INCRUSE ELLIPTA ?  ? ?  ? ? ?Discharge Exam: ?Filed Weights  ? 08/02/21 1713 08/04/21 1458 08/04/21 1851  ?Weight: 96.4 kg 92.3 kg 90.9 kg  ? ?GEN: NAD ?SKIN: Warm and dry ?EYES: No pallor or icterus ?ENT: MMM ?CV: RRR ?PULM: Bibasilar rales, no wheezing ?ABD: soft, ND, NT, +BS ?CNS: AAO x 3, non focal ?EXT: Trace bilateral leg edema ? ? ?Condition at discharge: stable ? ?The results of significant diagnostics from this hospitalization (including imaging, microbiology, ancillary and laboratory) are listed below for reference.  ? ?Imaging Studies: ?DG Chest 2 View ? ?Result Date: 07/28/2021 ?CLINICAL DATA:  Shortness of breath EXAM: CHEST - 2 VIEW COMPARISON:  Chest x-rays dated 06/10/2021 and 05/28/2021. FINDINGS: Increased opacity at the RIGHT lung base. Coarse lung markings are again seen throughout both lungs. Scarring/emphysematous change at the bilateral lung apices. No pneumothorax is seen. LEFT chest wall pacemaker/ICD apparatus in place. RIGHT-sided central catheter appears appropriately positioned with tip at the level of the mid/lower SVC. Heart size and mediastinal contours are stable. IMPRESSION: 1. Increased opacity at the RIGHT lung base. This could represent pneumonia, aspiration, atelectasis or asymmetric pulmonary edema. Favor atelectasis given the elevation of the RIGHT hemidiaphragm suggesting associated volume loss. 2. Chronic interstitial lung disease/fibrosis. 3. Emphysema.  Electronically Signed   By: Franki Cabot M.D.   On: 07/28/2021 09:02  ? ?DG Abd 1 View ? ?Result Date: 08/01/2021 ?CLINICAL DATA:  Enteric tube placement EXAM: ABDOMEN - 1 VIEW COMPARISON:  Upper GI done on 05/30/2021 FINDINGS: Tip of enteric tube is seen in the region of body of stomach. There is previous endovascular stent repair of abdominal aorta. Renal artery stents are noted. Biventricular pacer leads are noted in place. Transverse diameter of heart is increased. There is increased density in the left lower lung fields obscuring the left hemidiaphragm. IMPRESSION: Tip of enteric tube is seen in the stomach. Increased density in the left lower lung fields may suggest pleural effusion and possibly underlying infiltrate. Electronically Signed   By: Elmer Picker M.D.   On: 08/01/2021 19:45  ? ?CT HEAD WO CONTRAST (5MM) ? ?Result Date: 08/05/2021 ?CLINICAL DATA:  Mental status change. EXAM: CT HEAD WITHOUT CONTRAST TECHNIQUE: Contiguous axial images were obtained from the base of the skull through the vertex without intravenous contrast. RADIATION DOSE REDUCTION: This exam was performed according to the departmental dose-optimization program which includes automated exposure control, adjustment of the mA and/or kV according to patient size and/or use of iterative reconstruction technique. COMPARISON:  None. FINDINGS: Brain: No evidence of acute infarction, hemorrhage, hydrocephalus, extra-axial collection or mass lesion/mass  effect. Vascular: Scattered vascular calcifications but no aneurysm or hyperdense vessels. Skull: No skull fracture or bone lesion. Sinuses/Orbits: The paranasal sinuses are clear. There are extensive bilateral mastoid effusions and fluid in both middle ear cavities. The globes are intact. Other: No scalp lesions or scalp hematoma. IMPRESSION: 1. No acute intracranial findings. 2. Extensive bilateral mastoid effusions and fluid in both middle ear cavities. Electronically Signed   By: Marijo Sanes M.D.   On: 08/05/2021 11:05  ? ?DG Chest Port 1 View ? ?Result Date: 08/04/2021 ?CLINICAL DATA:  Status post thoracentesis EXAM: PORTABLE CHEST 1 VIEW COMPARISON:  08/04/2021 5:01 am FINDINGS: Cardiomegaly. [place] Comment

## 2021-08-11 NOTE — TOC Transition Note (Signed)
Transition of Care (TOC) - CM/SW Discharge Note ? ? ?Patient Details  ?Name: Peter [name] ?MRN: [id number] ?Date of Birth: 07/24/1940 ? ?Transition of Care (TOC) CM/SW Contact:  ?Beverly Sessions, RN ?Phone Number: ?Aug 19, 2021, 10:59 AM ? ? ?Clinical Narrative:    ? ?Notified by Lorayne Bender with [employer] that family has accepted bed at hospice home and signed consents ? ?EMS packet and signed DNR on chart.  EMS called for 12 pm ? ?  ?Barriers to Discharge: Continued Medical Work up ? ? ?Patient Goals and CMS Choice ?Patient states their goals for this hospitalization and ongoing recovery are:: family has decided on comfort care and hospice home ?CMS Medicare.gov Compare Post Acute Care list provided to:: Patient ?Choice offered to / list presented to : Patient, Adult Children ? ?Discharge Placement ?  ?           ?  ?  ?  ?  ? ?Discharge Plan and Services ?  ?Discharge Planning Services: CM Consult ?Post Acute Care Choice: Residential Hospice Bed          ?DME Arranged: N/A ?DME Agency: NA ?  ?  ?  ?HH Arranged: NA ?[place] Agency: NA ?  ?  ?  ? ?Social Determinants of Health (SDOH) Interventions ?  ? ? ?Readmission Risk Interventions ? ?  06/11/2021  ? 12:25 PM 04/28/2021  ? 12:10 PM  ?Readmission Risk Prevention Plan  ?Transportation Screening Complete Complete  ?PCP or Specialist Appt within 3-5 Days  Complete  ?[place] or Home Care Consult  Complete  ?Social Work Consult for [place] Planning/Counseling  Complete  ?Palliative Care Screening  Not Applicable  ?Medication Review Press photographer) Complete Complete  ?PCP or Specialist appointment within 3-5 days of discharge Complete   ?[place] or Home Care Consult Complete   ?SW Recovery Care/Counseling Consult Complete   ?Palliative Care Screening Not Applicable   ?[place] Not Applicable   ? ? ? ? ? ?

## 2021-08-11 NOTE — Plan of Care (Signed)
?  Problem: Education: Goal: Knowledge of disease or condition will improve Outcome: Progressing Goal: Knowledge of the prescribed therapeutic regimen will improve Outcome: Progressing   Problem: Activity: Goal: Ability to tolerate increased activity will improve Outcome: Progressing Goal: Will verbalize the importance of balancing activity with adequate rest periods Outcome: Progressing   Problem: Respiratory: Goal: Ability to maintain a clear airway will improve Outcome: Progressing Goal: Levels of oxygenation will improve Outcome: Progressing Goal: Ability to maintain adequate ventilation will improve Outcome: Progressing   Problem: Education: Goal: Knowledge of General Education information will improve Description: Including pain rating scale, medication(s)/side effects and non-pharmacologic comfort measures Outcome: Progressing   Problem: Health Behavior/Discharge Planning: Goal: Ability to manage health-related needs will improve Outcome: Progressing   Problem: Clinical Measurements: Goal: Ability to maintain clinical measurements within normal limits will improve Outcome: Progressing Goal: Will remain free from infection Outcome: Progressing Goal: Diagnostic test results will improve Outcome: Progressing Goal: Respiratory complications will improve Outcome: Progressing Goal: Cardiovascular complication will be avoided Outcome: Progressing   Problem: Activity: Goal: Risk for activity intolerance will decrease Outcome: Progressing   Problem: Nutrition: Goal: Adequate nutrition will be maintained Outcome: Progressing   Problem: Coping: Goal: Level of anxiety will decrease Outcome: Progressing   Problem: Elimination: Goal: Will not experience complications related to bowel motility Outcome: Progressing Goal: Will not experience complications related to urinary retention Outcome: Progressing   Problem: Pain Managment: Goal: General experience of comfort  will improve Outcome: Progressing   Problem: Safety: Goal: Ability to remain free from injury will improve Outcome: Progressing   Problem: Skin Integrity: Goal: Risk for impaired skin integrity will decrease Outcome: Progressing   

## 2021-08-11 NOTE — Progress Notes (Signed)
?   2021/08/18 0800  ?Clinical Encounter Type  ?Visited With Patient and family together  ?Visit Type Follow-up  ?Spiritual Encounters  ?Spiritual Needs Prayer  ? ?Chaplain followed up on patient to provide support. ?

## 2021-08-11 NOTE — Care Management Important Message (Signed)
Important Message ? ?Patient Details  ?Name: Peter [name] ?MRN: [id number] ?Date of Birth: 1941-04-13 ? ? ?Medicare Important Message Given:  Other (see comment) ? ?Disposition to discharge with hospice services.  Medicare IM withheld at this time out of respect for patient and family.   ? ? ?[name] ?09/07/21, 8:33 AM ?

## 2021-08-11 NOTE — Progress Notes (Addendum)
[facility]  ? ?Consent forms have been completed. ? ?EMS/ notified of patient D/C and transport arranged for 12p TOC/Stephanie and Attending Physician/Dr. Mal Misty also notified of transport arrangement.  ?  ?Please send signed DNR form with patient and RN call report to [phone].  ?  ?Daphene Calamity, MSW ?[facility] ?[phone] ? ?

## 2021-08-11 NOTE — Plan of Care (Signed)
Patient cognitive status not adequate to retain educational information pertaining to care. Patient unable to provide personal care for himself. He remains on oxygen via nasal cannula.  ?Problem: Education: ?Goal: Knowledge of disease or condition will improve ?Outcome: Not Progressing ?Goal: Knowledge of the prescribed therapeutic regimen will improve ?Outcome: Not Progressing ?Goal: Individualized Educational Video(s) ?Outcome: Not Applicable ?  ?Problem: Activity: ?Goal: Ability to tolerate increased activity will improve ?Outcome: Not Progressing ?Goal: Will verbalize the importance of balancing activity with adequate rest periods ?Outcome: Progressing ?  ?Problem: Respiratory: ?Goal: Ability to maintain a clear airway will improve ?Outcome: Progressing ?Goal: Levels of oxygenation will improve ?Outcome: Progressing ?Goal: Ability to maintain adequate ventilation will improve ?Outcome: Progressing ?  ?Problem: Education: ?Goal: Knowledge of General Education information will improve ?Description: Including pain rating scale, medication(s)/side effects and non-pharmacologic comfort measures ?Outcome: Progressing ?  ?Problem: Health Behavior/Discharge Planning: ?Goal: Ability to manage health-related needs will improve ?Outcome: Progressing ?  ?Problem: Clinical Measurements: ?Goal: Ability to maintain clinical measurements within normal limits will improve ?Outcome: Progressing ?Goal: Will remain free from infection ?Outcome: Progressing ?Goal: Diagnostic test results will improve ?Outcome: Progressing ?Goal: Respiratory complications will improve ?Outcome: Progressing ?Goal: Cardiovascular complication will be avoided ?Outcome: Progressing ?  ?Problem: Activity: ?Goal: Risk for activity intolerance will decrease ?Outcome: Progressing ?  ?Problem: Nutrition: ?Goal: Adequate nutrition will be maintained ?Outcome: Progressing ?  ?Problem: Coping: ?Goal: Level of anxiety will decrease ?Outcome: Progressing ?   ?Problem: Elimination: ?Goal: Will not experience complications related to bowel motility ?Outcome: Progressing ?Goal: Will not experience complications related to urinary retention ?Outcome: Progressing ?  ?Problem: Pain Managment: ?Goal: General experience of comfort will improve ?Outcome: Progressing ?  ?Problem: Safety: ?Goal: Ability to remain free from injury will improve ?Outcome: Progressing ?  ?Problem: Skin Integrity: ?Goal: Risk for impaired skin integrity will decrease ?Outcome: Progressing ?  ?

## 2021-08-11 NOTE — Progress Notes (Signed)
Received inbound call from [place] at [facility]. Provided patient report as requested.  ?

## 2021-08-11 DEATH — deceased

## 2021-09-05 ENCOUNTER — Encounter (INDEPENDENT_AMBULATORY_CARE_PROVIDER_SITE_OTHER): Payer: Self-pay | Admitting: Vascular Surgery

## 2021-09-09 ENCOUNTER — Encounter (INDEPENDENT_AMBULATORY_CARE_PROVIDER_SITE_OTHER): Payer: Self-pay | Admitting: Nurse Practitioner

## 2022-03-04 ENCOUNTER — Ambulatory Visit: Payer: PPO | Admitting: Urology

## 2022-03-11 IMAGING — US US EXTREM  UP VENOUS*L*
1 series · 13 of 24 positions shown · non-contrast
Comparison: None.

CLINICAL DATA: Left upper extremity pain and swelling.



[Series 1: us venous img upper uni left (dvt) · portal-venous · 13 of 33 slices shown]
[im 1/33]
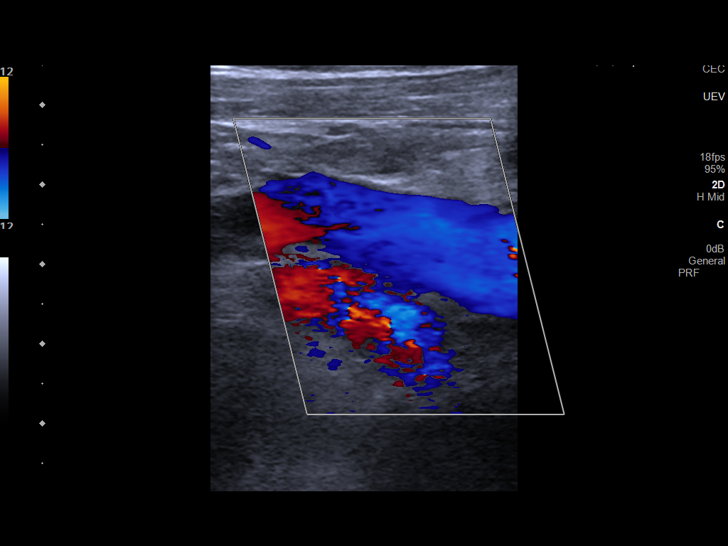
[im 3/33]
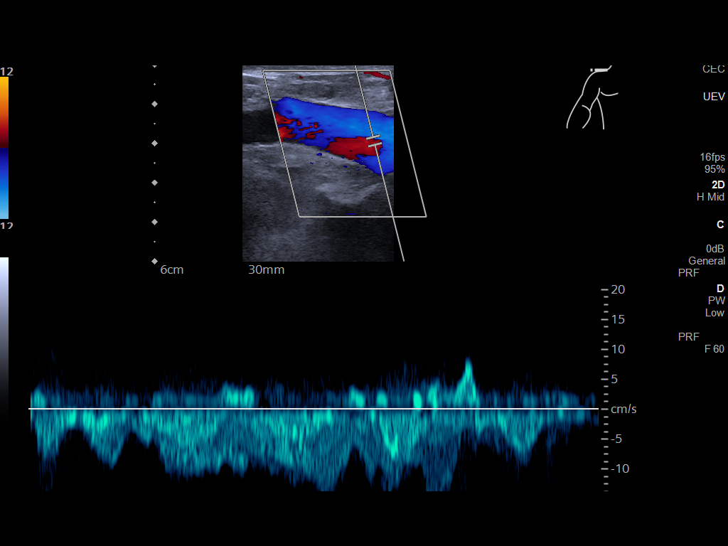
[im 6/33]
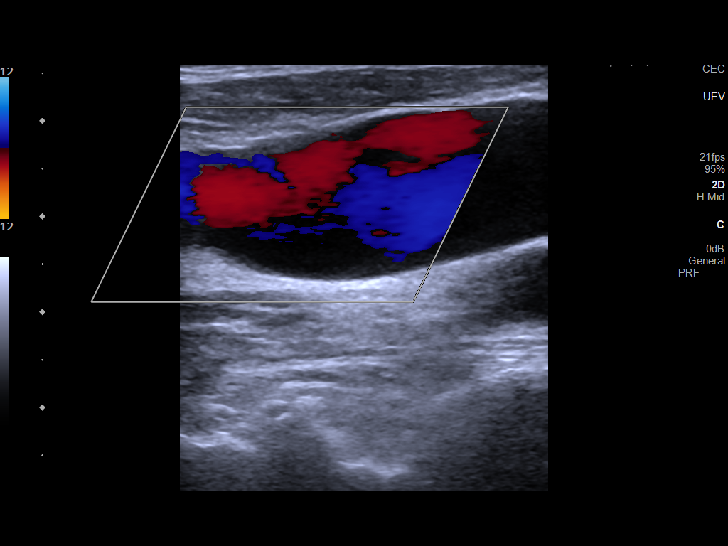
[im 9/33]
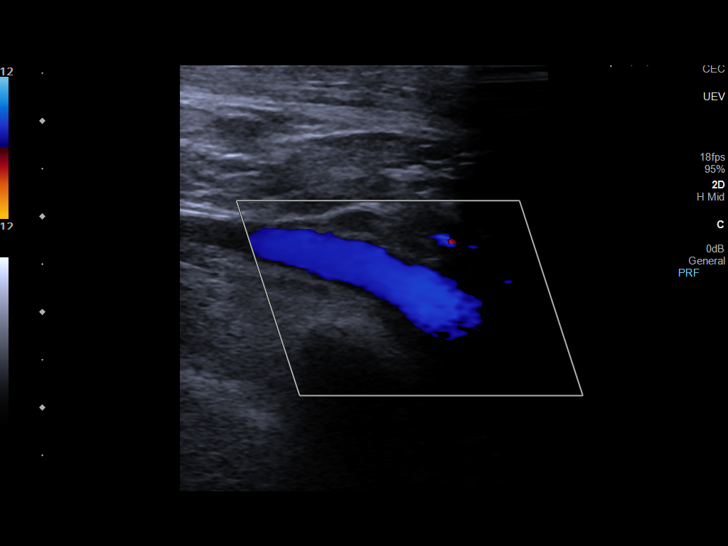
[im 12/33]
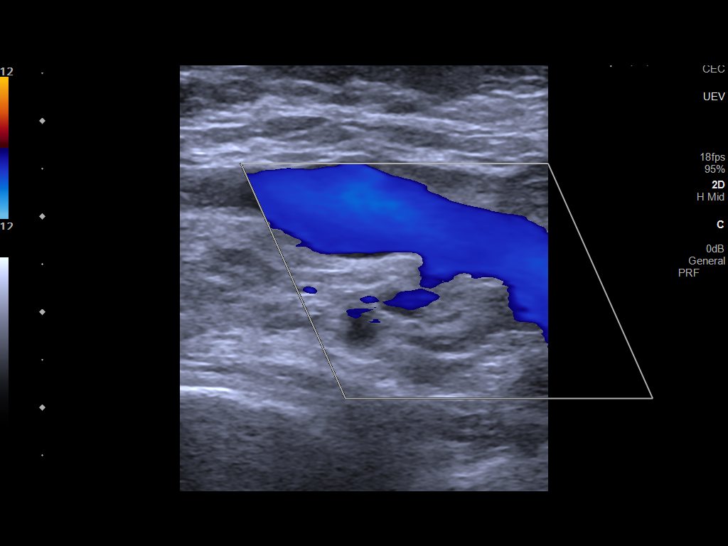
[im 14/33]
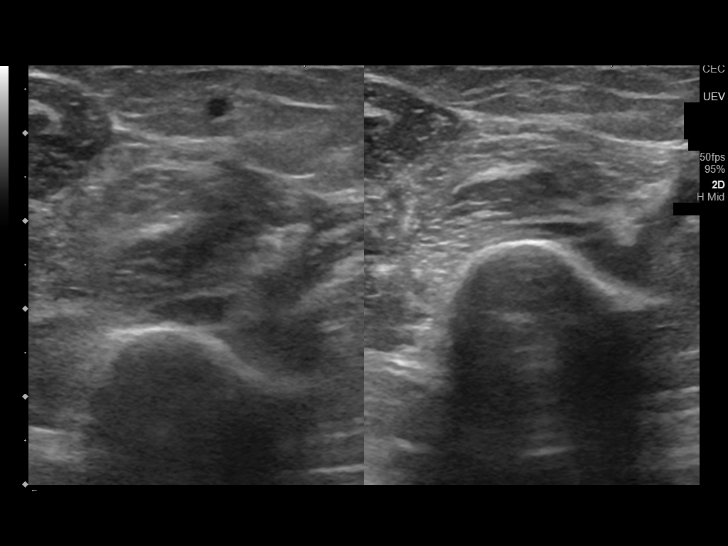
[im 17/33]
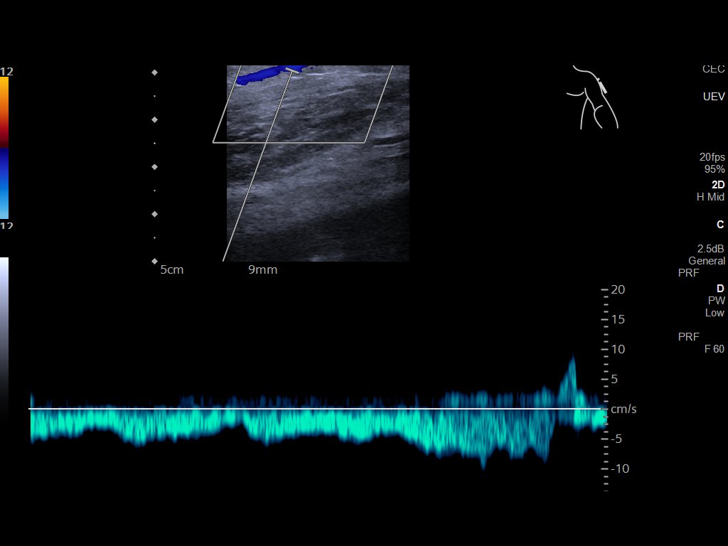
[im 19/33]
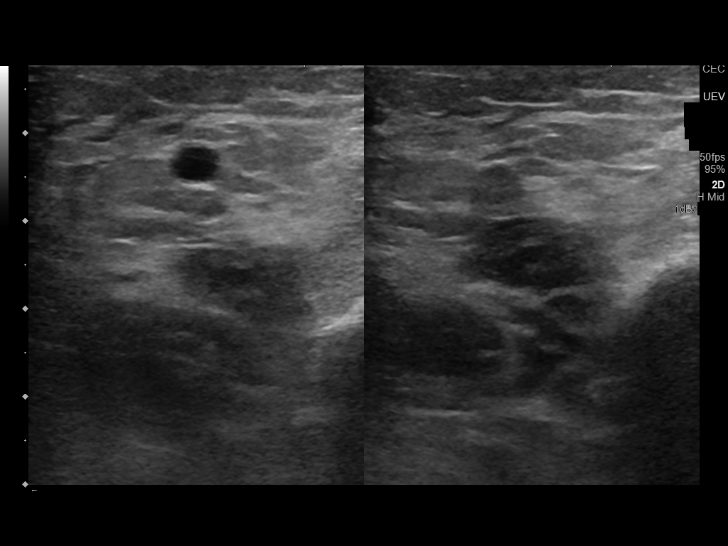
[im 21/33]
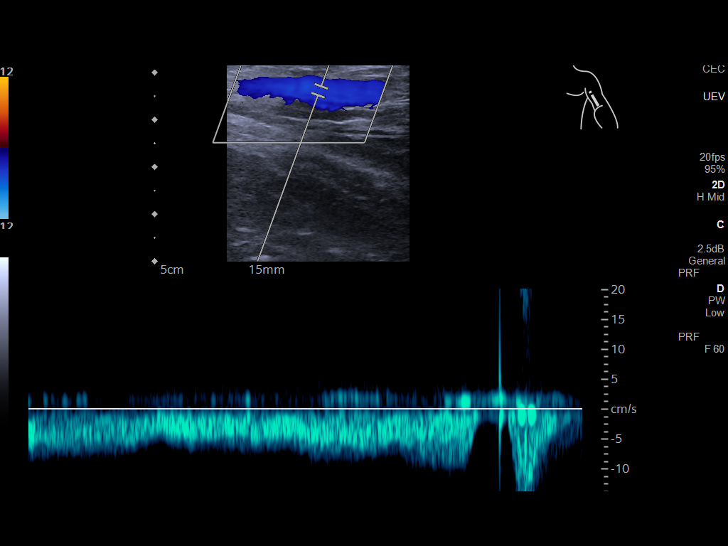
[im 24/33]
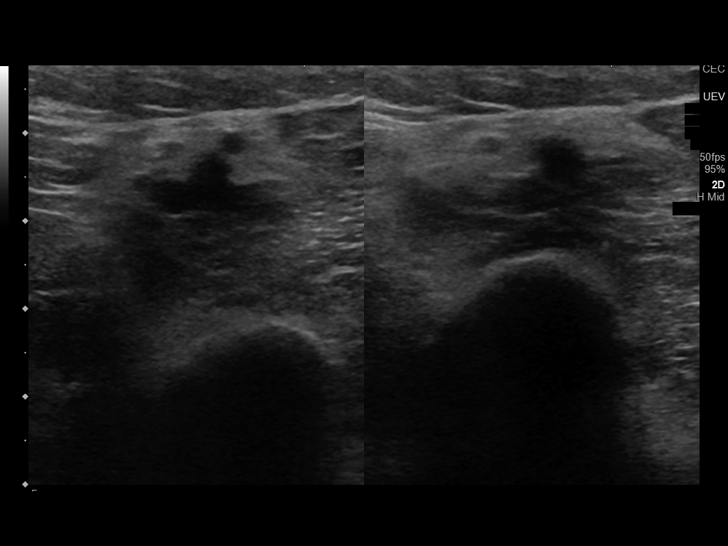
[im 27/33]
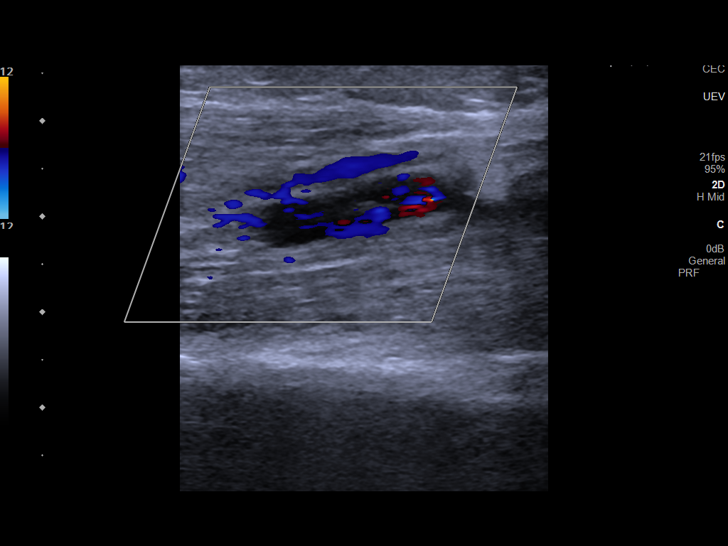
[im 30/33]
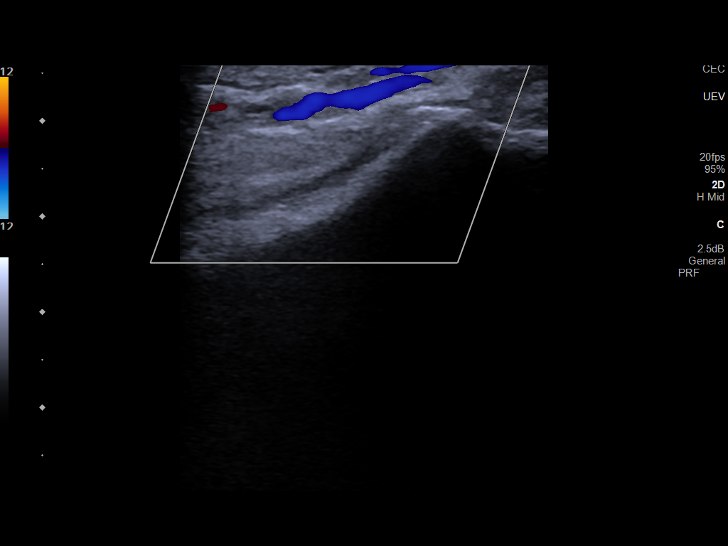
[im 33/33]
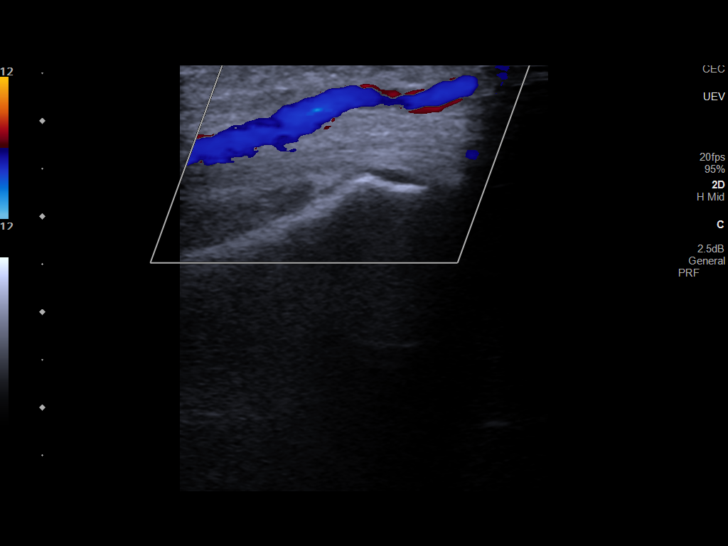

[13 of 24 positions shown; findings below may reference images not displayed]

FINDINGS: Contralateral Subclavian Vein: Respiratory phasicity is normal and
symmetric with the symptomatic side. No evidence of thrombus. Normal
compressibility.

Internal Jugular Vein: No evidence of thrombus. Normal
compressibility, respiratory phasicity and response to augmentation.

Subclavian Vein: No evidence of thrombus. Normal compressibility,
respiratory phasicity and response to augmentation.

Axillary Vein: No evidence of thrombus. Normal compressibility,
respiratory phasicity and response to augmentation.

Cephalic Vein: No evidence of thrombus. Normal compressibility,
respiratory phasicity and response to augmentation.

Basilic Vein: No evidence of thrombus. Normal compressibility,
respiratory phasicity and response to augmentation.

Brachial Veins: No evidence of thrombus. Normal compressibility,
respiratory phasicity and response to augmentation.

Radial Veins: No evidence of thrombus. Normal compressibility,
respiratory phasicity and response to augmentation.

Ulnar Veins: No evidence of thrombus. Normal compressibility,
respiratory phasicity and response to augmentation.

Venous Reflux:  None visualized.

Other Findings:  None visualized.
IMPRESSION: No evidence of DVT within the LEFT upper extremity.

## 2022-03-11 IMAGING — CR DG CHEST 2V
2 series · 2 of 2 positions shown · non-contrast
Comparison: 04/03/2021

CLINICAL DATA: Shortness of breath

EXAM:
CHEST - 2 VIEW

[chest pa]
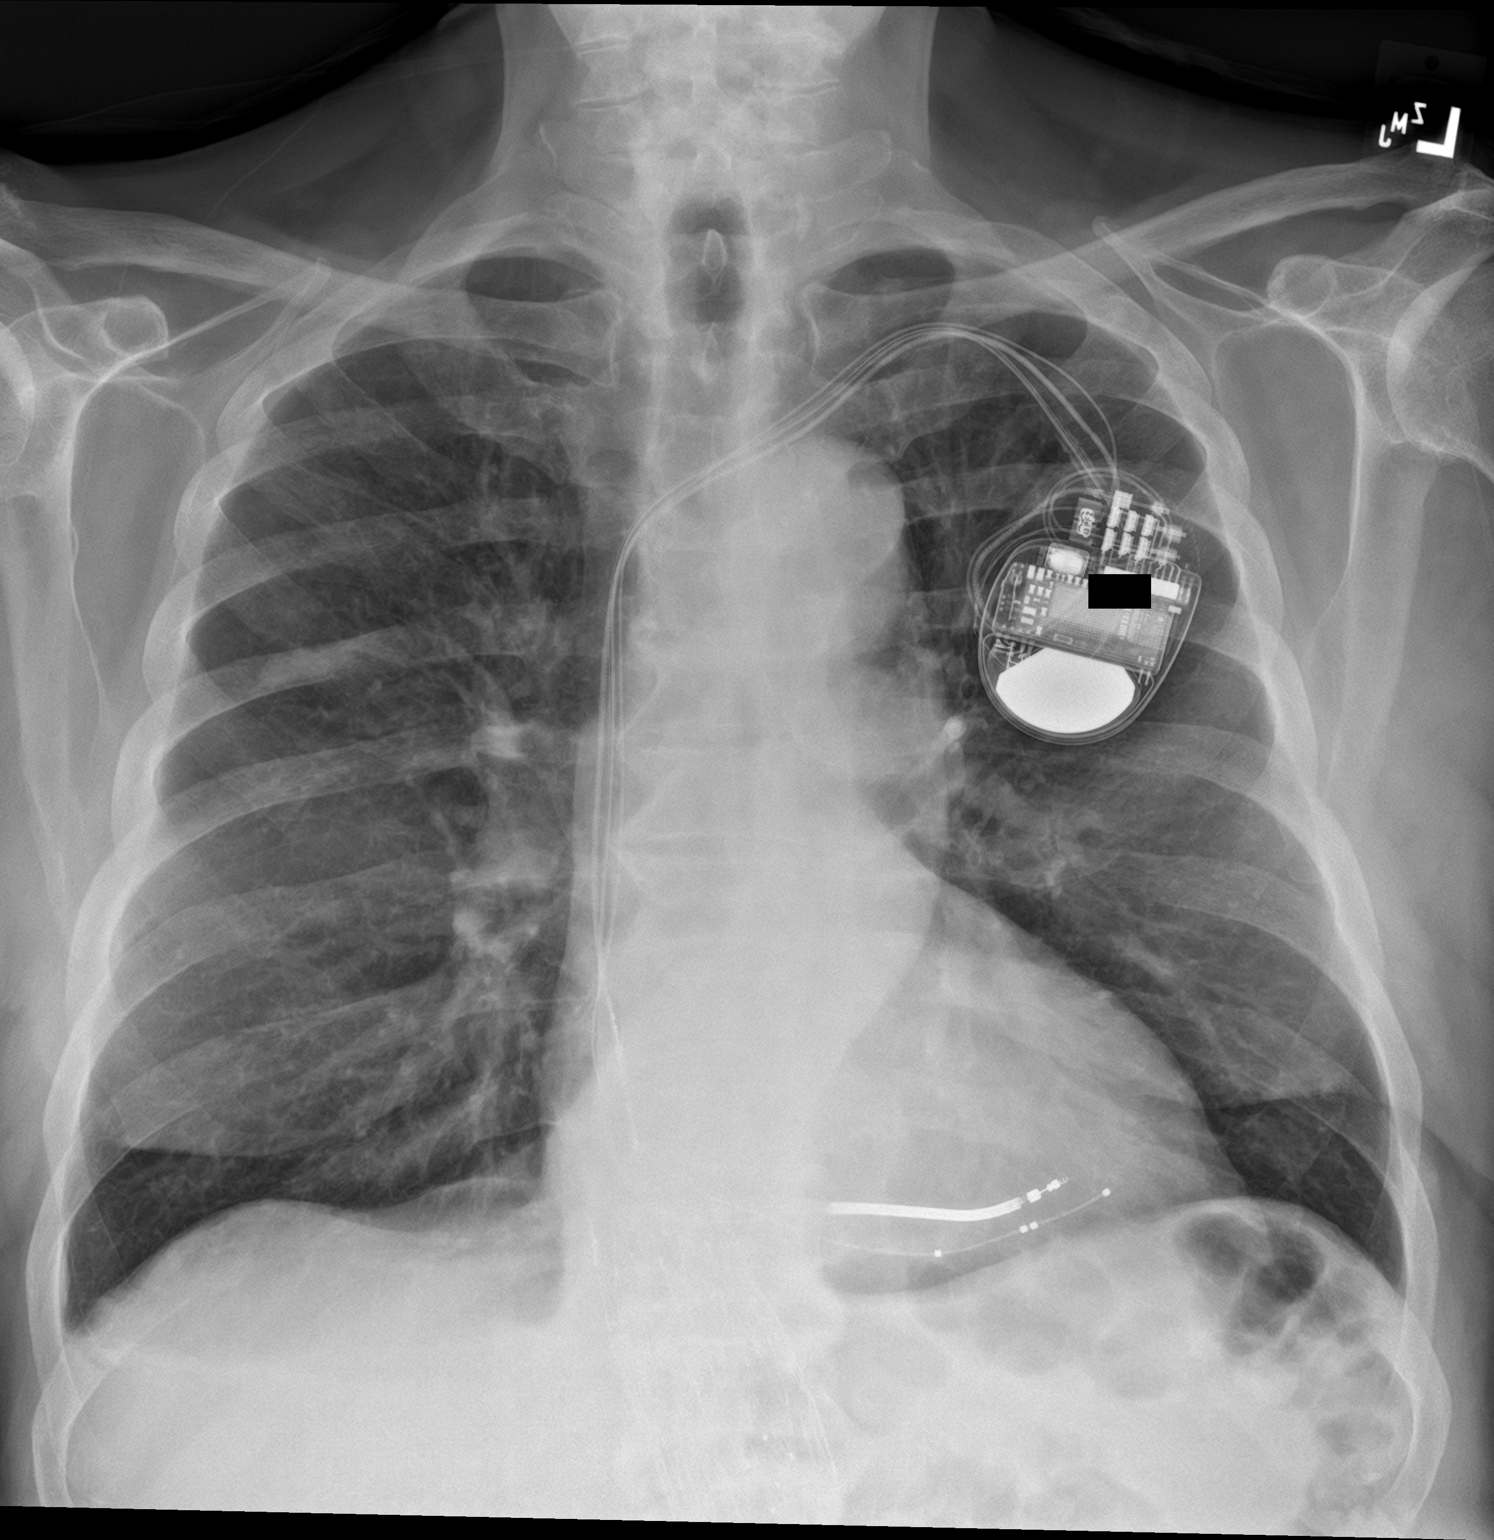

[chest lat]
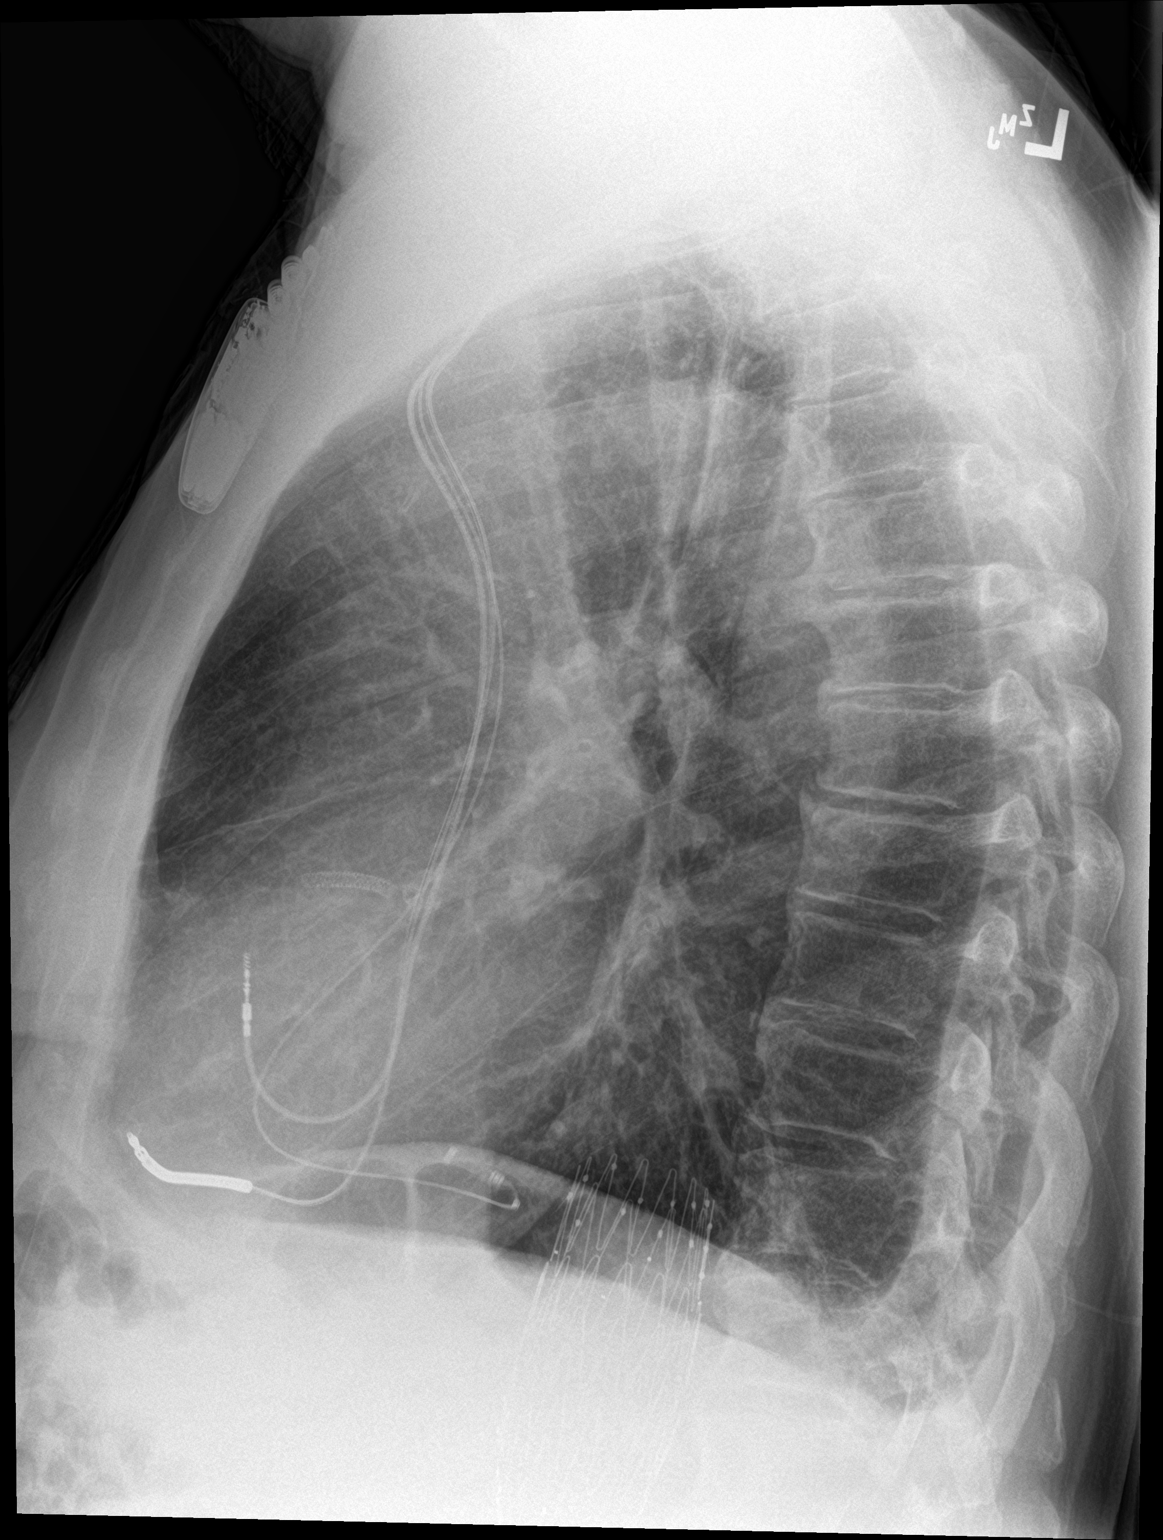

[2 of 2 positions shown; findings below may reference images not displayed]

FINDINGS: No focal consolidation. No pleural effusion or pneumothorax. Heart
and mediastinal contours are unremarkable. Dual lead cardiac
pacemaker.

No acute osseous abnormality.
IMPRESSION: No active cardiopulmonary disease.

## 2022-03-12 IMAGING — CT CT CHEST W/O CM
2 of 4 series · 15 of 36 positions shown, 18 images · non-contrast
Comparison: December 14, 2011

CLINICAL DATA: Shortness of breath.

EXAM:
CT CHEST WITHOUT CONTRAST
TECHNIQUE: Multidetector CT imaging of the chest was performed following the
standard protocol without IV contrast.

[Series 2: thorax · axial · 0.80mm/px · z∈[-929,-613]mm · 12 of 188 slices shown, 15 images]
[im 15/188  mediastinal]
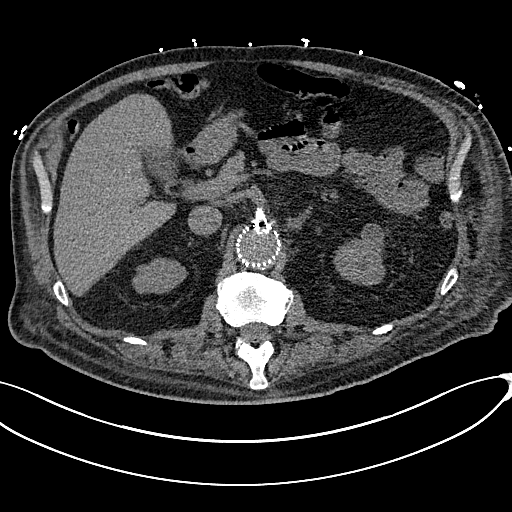
[im 15/188  lung]
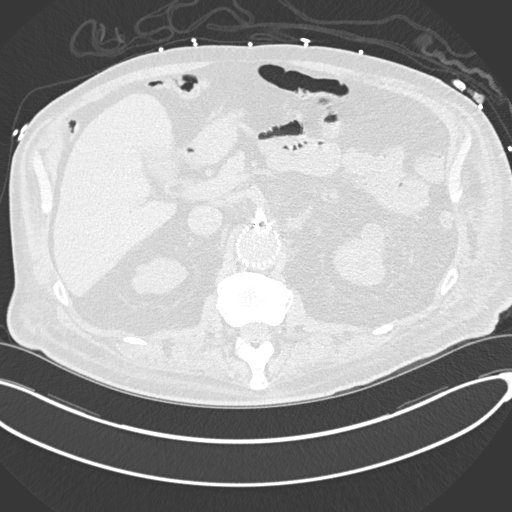
[im 29/188  lung]
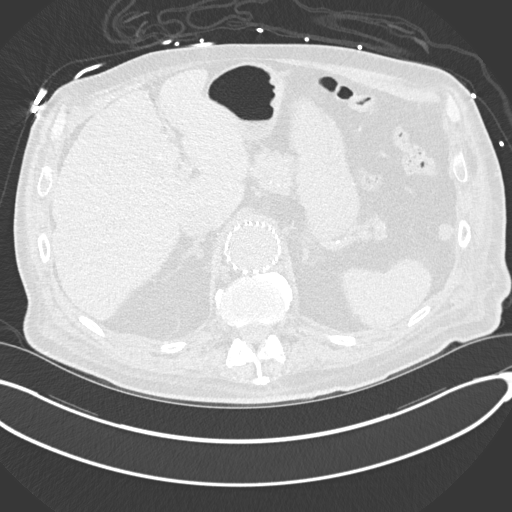
[im 44/188  lung]
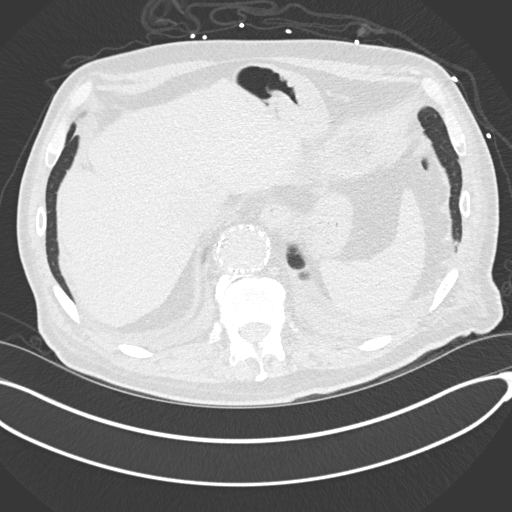
[im 58/188  lung]
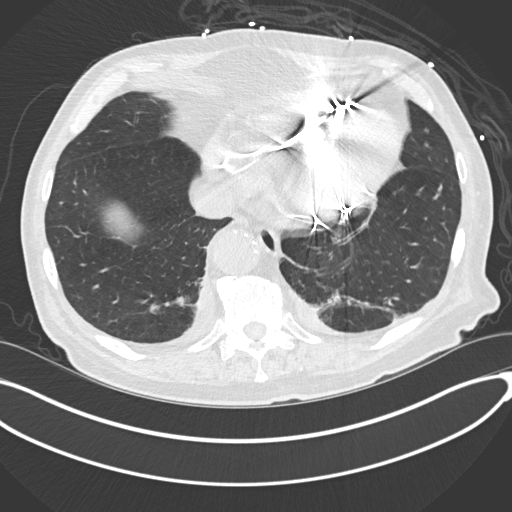
[im 72/188  mediastinal]
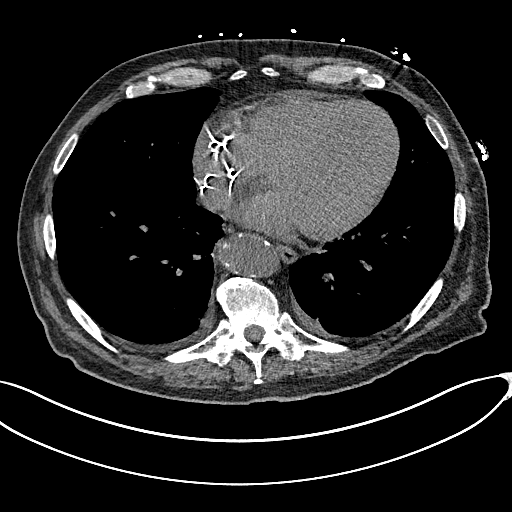
[im 72/188  lung]
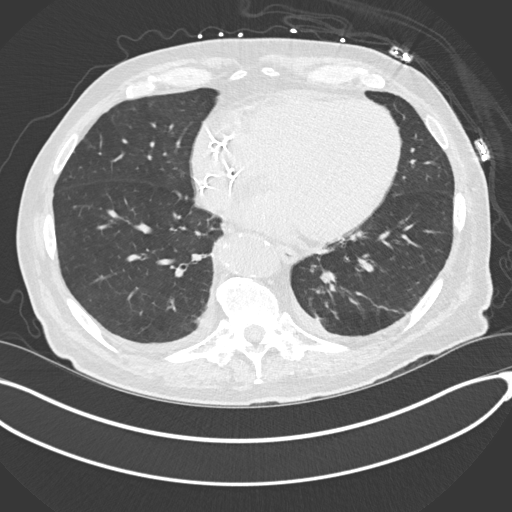
[im 87/188  lung]
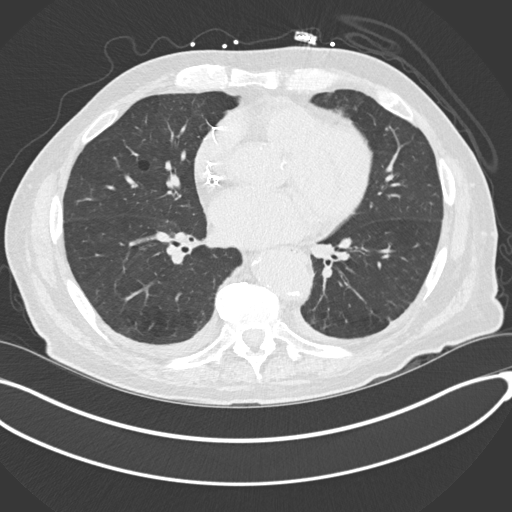
[im 101/188  lung]
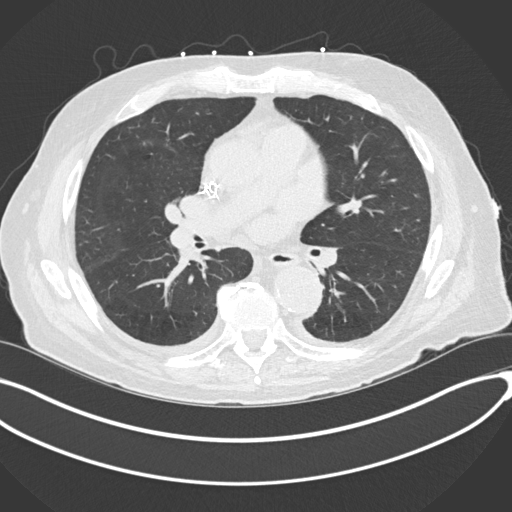
[im 116/188  lung]
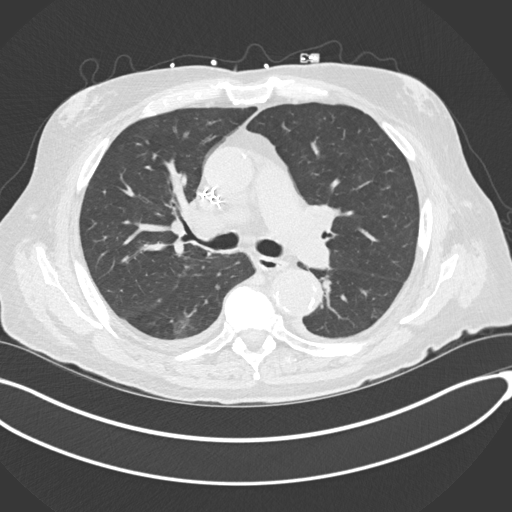
[im 130/188  mediastinal]
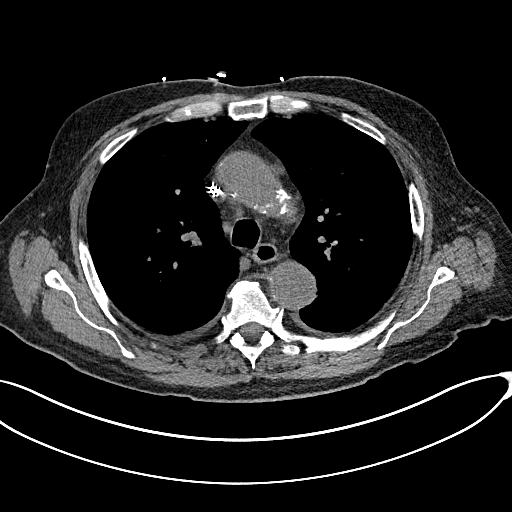
[im 130/188  lung]
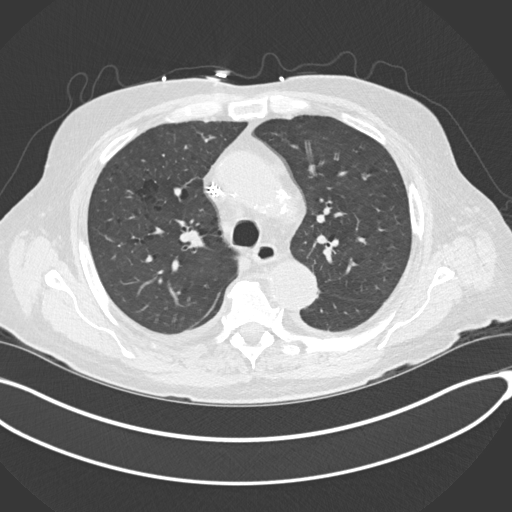
[im 144/188  lung]
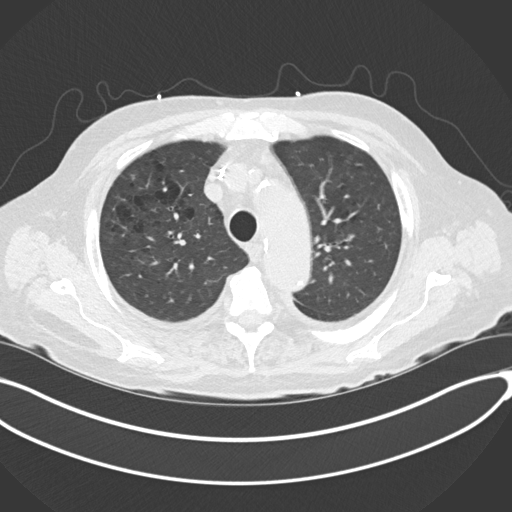
[im 159/188  lung]
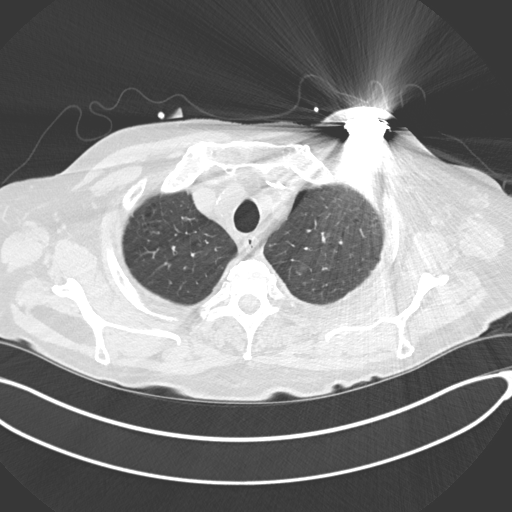
[im 173/188  lung]
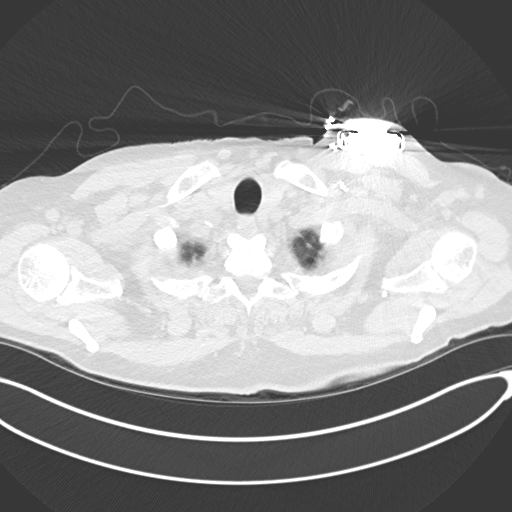

[Series 5: coronal · coronal · 0.76mm/px · 3 of 151 slices shown]
[im 31/151  lung]
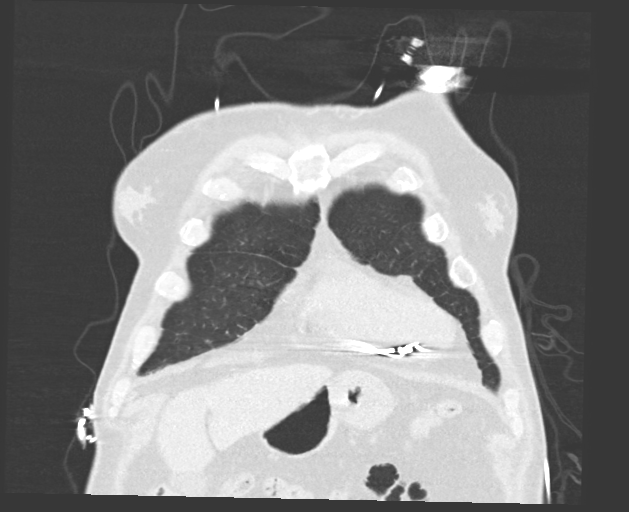
[im 61/151  lung]
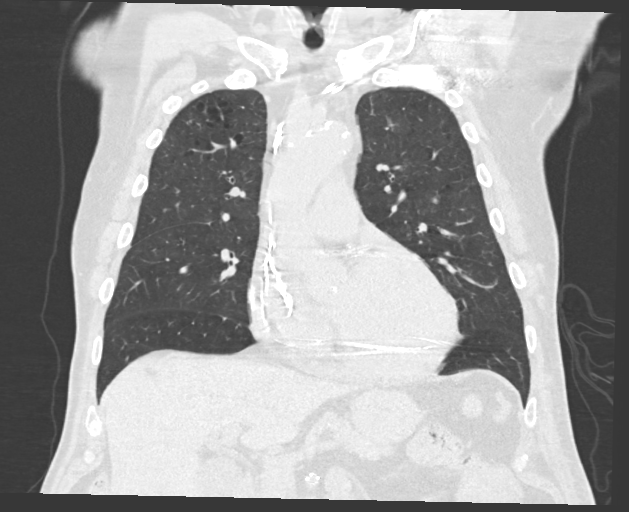
[im 91/151  lung]
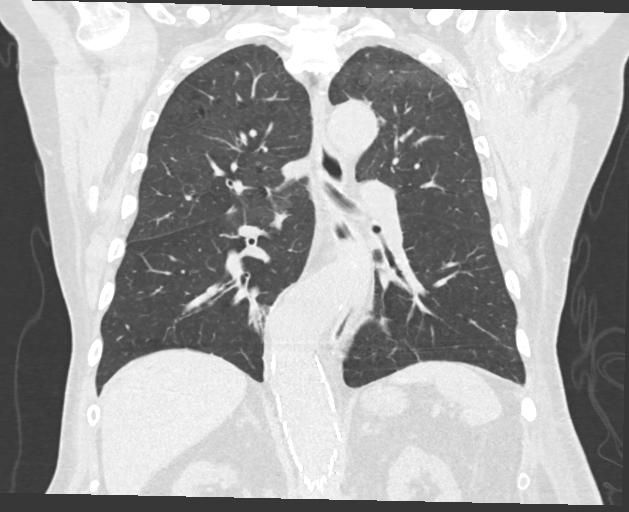

[15 of 36 positions shown; findings below may reference images not displayed]

FINDINGS: Cardiovascular: A dual lead AICD is in place. There is moderate to
marked severity calcification of the aortic arch and descending
thoracic aorta. 4.0 cm aneurysmal dilatation of the mid aortic arch
is seen. A stent is noted within the distal aspect of the descending
thoracic aorta. This extends to include the visualized portion of
the abdominal aorta. Normal heart size with coronary artery stents
in place. No pericardial effusion.

Mediastinum/Nodes: There is mild AP window and pretracheal
lymphadenopathy. Thyroid gland, trachea, and esophagus demonstrate
no significant findings.

Lungs/Pleura: There is mild emphysematous lung disease involving the
bilateral upper lobes.

Mild areas of linear scarring and/or atelectasis are noted within
the posterior aspects of the bilateral lung bases.

There are very small bilateral pleural effusions.

No pneumothorax is identified.

Upper Abdomen: A thin layer of tiny gallstones is seen within the
dependent portion of the gallbladder.

Vascular stents are seen within the origins of the celiac artery,
superior mesenteric artery and bilateral renal arteries.

Musculoskeletal: Multilevel degenerative changes seen throughout the
thoracic spine.
IMPRESSION: 1. Mild emphysematous lung disease.
2. Mild bibasilar linear scarring and/or atelectasis.
3. Very small bilateral pleural effusions.
4. Extensive stenting of the distal descending thoracic aorta, as
well as the abdominal aorta and several of its branches.
5. Cholelithiasis.

Aortic Atherosclerosis (W75ZB-QAZ.Z) and Emphysema ([id number]-[id number].G).
